# Patient Record
Sex: Male | Born: 1960 | Race: White | Hispanic: No | Marital: Single | Smoking: Never smoker
Health system: Southern US, Community
[De-identification: ages and names within clinical notes are randomized; demographics above are authoritative.]

## PROBLEM LIST (undated history)

## (undated) DIAGNOSIS — Z9889 Other specified postprocedural states: Secondary | ICD-10-CM

## (undated) DIAGNOSIS — E7800 Pure hypercholesterolemia, unspecified: Secondary | ICD-10-CM

## (undated) DIAGNOSIS — K219 Gastro-esophageal reflux disease without esophagitis: Secondary | ICD-10-CM

## (undated) DIAGNOSIS — I509 Heart failure, unspecified: Secondary | ICD-10-CM

## (undated) DIAGNOSIS — Z794 Long term (current) use of insulin: Secondary | ICD-10-CM

## (undated) DIAGNOSIS — R112 Nausea with vomiting, unspecified: Secondary | ICD-10-CM

## (undated) DIAGNOSIS — N189 Chronic kidney disease, unspecified: Secondary | ICD-10-CM

## (undated) DIAGNOSIS — I739 Peripheral vascular disease, unspecified: Secondary | ICD-10-CM

## (undated) DIAGNOSIS — J189 Pneumonia, unspecified organism: Secondary | ICD-10-CM

## (undated) DIAGNOSIS — F329 Major depressive disorder, single episode, unspecified: Secondary | ICD-10-CM

## (undated) DIAGNOSIS — G629 Polyneuropathy, unspecified: Secondary | ICD-10-CM

## (undated) DIAGNOSIS — Z8719 Personal history of other diseases of the digestive system: Secondary | ICD-10-CM

## (undated) DIAGNOSIS — IMO0001 Reserved for inherently not codable concepts without codable children: Secondary | ICD-10-CM

## (undated) DIAGNOSIS — L988 Other specified disorders of the skin and subcutaneous tissue: Secondary | ICD-10-CM

## (undated) DIAGNOSIS — E119 Type 2 diabetes mellitus without complications: Secondary | ICD-10-CM

## (undated) DIAGNOSIS — I10 Essential (primary) hypertension: Secondary | ICD-10-CM

## (undated) HISTORY — PX: TONSILLECTOMY: SUR1361

## (undated) HISTORY — DX: Other specified disorders of the skin and subcutaneous tissue: L98.8

---

## 2002-12-31 ENCOUNTER — Emergency Department (HOSPITAL_COMMUNITY): Admission: AD | Admit: 2002-12-31 | Discharge: 2002-12-31 | Payer: Self-pay | Admitting: Family Medicine

## 2003-01-02 ENCOUNTER — Emergency Department (HOSPITAL_COMMUNITY): Admission: AD | Admit: 2003-01-02 | Discharge: 2003-01-02 | Payer: Self-pay | Admitting: Family Medicine

## 2003-01-05 ENCOUNTER — Emergency Department (HOSPITAL_COMMUNITY): Admission: AD | Admit: 2003-01-05 | Discharge: 2003-01-05 | Payer: Self-pay | Admitting: Family Medicine

## 2003-11-27 ENCOUNTER — Inpatient Hospital Stay (HOSPITAL_COMMUNITY): Admission: EM | Admit: 2003-11-27 | Discharge: 2003-12-03 | Payer: Self-pay | Admitting: Emergency Medicine

## 2003-12-02 ENCOUNTER — Encounter: Payer: Self-pay | Admitting: Cardiology

## 2003-12-31 ENCOUNTER — Encounter: Admission: RE | Admit: 2003-12-31 | Discharge: 2003-12-31 | Payer: Self-pay | Admitting: Internal Medicine

## 2007-07-29 ENCOUNTER — Emergency Department (HOSPITAL_COMMUNITY): Admission: EM | Admit: 2007-07-29 | Discharge: 2007-07-30 | Payer: Self-pay | Admitting: Emergency Medicine

## 2007-12-26 ENCOUNTER — Ambulatory Visit: Payer: Self-pay | Admitting: Family Medicine

## 2007-12-26 DIAGNOSIS — E7800 Pure hypercholesterolemia, unspecified: Secondary | ICD-10-CM

## 2007-12-26 DIAGNOSIS — E119 Type 2 diabetes mellitus without complications: Secondary | ICD-10-CM

## 2007-12-26 LAB — CONVERTED CEMR LAB
ALT: 8 units/L (ref 0–53)
AST: 9 units/L (ref 0–37)
Albumin: 4.1 g/dL (ref 3.5–5.2)
Alkaline Phosphatase: 114 units/L (ref 39–117)
BUN: 24 mg/dL — ABNORMAL HIGH (ref 6–23)
Basophils Absolute: 0 10*3/uL (ref 0.0–0.1)
Basophils Relative: 1 % (ref 0–1)
CO2: 20 meq/L (ref 19–32)
Calcium: 9.6 mg/dL (ref 8.4–10.5)
Chloride: 98 meq/L (ref 96–112)
Cholesterol: 347 mg/dL — ABNORMAL HIGH (ref 0–200)
Creatinine, Ser: 0.75 mg/dL (ref 0.40–1.50)
Eosinophils Absolute: 0.1 10*3/uL (ref 0.0–0.7)
Eosinophils Relative: 1 % (ref 0–5)
Glucose, Bld: 402 mg/dL — ABNORMAL HIGH (ref 70–99)
HCT: 44.1 % (ref 39.0–52.0)
HDL: 33 mg/dL — ABNORMAL LOW (ref >39)
Hemoglobin: 14.4 g/dL (ref 13.0–17.0)
Hgb A1c MFr Bld: >14 %
Lymphocytes Relative: 29 % (ref 12–46)
Lymphs Abs: 1.3 10*3/uL (ref 0.7–4.0)
MCHC: 32.7 g/dL (ref 30.0–36.0)
MCV: 83.5 fL (ref 78.0–100.0)
Monocytes Absolute: 0.4 10*3/uL (ref 0.1–1.0)
Monocytes Relative: 8 % (ref 3–12)
Neutro Abs: 2.7 10*3/uL (ref 1.7–7.7)
Neutrophils Relative %: 61 % (ref 43–77)
Platelets: 147 10*3/uL — ABNORMAL LOW (ref 150–400)
Potassium: 4.2 meq/L (ref 3.5–5.3)
Protein, U semiquant: 30
RBC: 5.28 M/uL (ref 4.22–5.81)
RDW: 14 % (ref 11.5–15.5)
Sodium: 136 meq/L (ref 135–145)
Total Bilirubin: 0.4 mg/dL (ref 0.3–1.2)
Total CHOL/HDL Ratio: 10.5
Total Protein: 6.9 g/dL (ref 6.0–8.3)
Triglycerides: 783 mg/dL — ABNORMAL HIGH (ref <150)
WBC: 4.4 10*3/uL (ref 4.0–10.5)

## 2007-12-28 ENCOUNTER — Encounter: Payer: Self-pay | Admitting: Family Medicine

## 2008-01-29 ENCOUNTER — Encounter: Payer: Self-pay | Admitting: Family Medicine

## 2008-01-30 ENCOUNTER — Ambulatory Visit: Payer: Self-pay | Admitting: Family Medicine

## 2008-01-30 LAB — CONVERTED CEMR LAB
BUN: 21 mg/dL (ref 6–23)
CO2: 24 meq/L (ref 19–32)
Calcium: 9.1 mg/dL (ref 8.4–10.5)
Chloride: 104 meq/L (ref 96–112)
Cholesterol: 188 mg/dL (ref 0–200)
Creatinine, Ser: 0.69 mg/dL (ref 0.40–1.50)
Glucose, Bld: 164 mg/dL — ABNORMAL HIGH (ref 70–99)
HDL: 34 mg/dL — ABNORMAL LOW (ref >39)
LDL Cholesterol: 121 mg/dL — ABNORMAL HIGH (ref 0–99)
Potassium: 4.3 meq/L (ref 3.5–5.3)
Sodium: 141 meq/L (ref 135–145)
Total CHOL/HDL Ratio: 5.5
Triglycerides: 164 mg/dL — ABNORMAL HIGH (ref <150)
VLDL: 33 mg/dL (ref 0–40)

## 2008-01-31 ENCOUNTER — Encounter: Payer: Self-pay | Admitting: Family Medicine

## 2008-02-20 ENCOUNTER — Ambulatory Visit: Payer: Self-pay | Admitting: Family Medicine

## 2008-02-20 DIAGNOSIS — H538 Other visual disturbances: Secondary | ICD-10-CM

## 2008-03-15 ENCOUNTER — Telehealth (INDEPENDENT_AMBULATORY_CARE_PROVIDER_SITE_OTHER): Payer: Self-pay | Admitting: *Deleted

## 2008-03-19 ENCOUNTER — Telehealth: Payer: Self-pay | Admitting: Family Medicine

## 2008-06-18 ENCOUNTER — Ambulatory Visit: Payer: Self-pay | Admitting: Family Medicine

## 2008-06-18 DIAGNOSIS — I10 Essential (primary) hypertension: Secondary | ICD-10-CM | POA: Insufficient documentation

## 2008-06-18 LAB — CONVERTED CEMR LAB: Hgb A1c MFr Bld: 8.2 %

## 2008-06-27 ENCOUNTER — Encounter: Payer: Self-pay | Admitting: Family Medicine

## 2008-06-27 LAB — CONVERTED CEMR LAB
Cholesterol: 164 mg/dL (ref 0–200)
HDL: 32 mg/dL — ABNORMAL LOW (ref >39)
LDL Cholesterol: 87 mg/dL (ref 0–99)
Total CHOL/HDL Ratio: 5.1
Triglycerides: 227 mg/dL — ABNORMAL HIGH (ref <150)
VLDL: 45 mg/dL — ABNORMAL HIGH (ref 0–40)

## 2008-12-05 ENCOUNTER — Ambulatory Visit: Payer: Self-pay | Admitting: Family Medicine

## 2008-12-05 DIAGNOSIS — L0201 Cutaneous abscess of face: Secondary | ICD-10-CM | POA: Insufficient documentation

## 2008-12-05 DIAGNOSIS — L03211 Cellulitis of face: Secondary | ICD-10-CM

## 2008-12-06 ENCOUNTER — Encounter: Payer: Self-pay | Admitting: Family Medicine

## 2008-12-13 ENCOUNTER — Ambulatory Visit: Payer: Self-pay | Admitting: Family Medicine

## 2008-12-13 DIAGNOSIS — R252 Cramp and spasm: Secondary | ICD-10-CM

## 2008-12-13 LAB — CONVERTED CEMR LAB
ALT: 13 units/L (ref 0–53)
AST: 11 units/L (ref 0–37)
Albumin: 4.3 g/dL (ref 3.5–5.2)
Alkaline Phosphatase: 106 units/L (ref 39–117)
BUN: 33 mg/dL — ABNORMAL HIGH (ref 6–23)
CO2: 23 meq/L (ref 19–32)
Calcium: 10.5 mg/dL (ref 8.4–10.5)
Chloride: 98 meq/L (ref 96–112)
Creatinine, Ser: 1.22 mg/dL (ref 0.40–1.50)
Glucose, Bld: 328 mg/dL — ABNORMAL HIGH (ref 70–99)
HCT: 38.6 % — ABNORMAL LOW (ref 39.0–52.0)
Hemoglobin: 12.8 g/dL — ABNORMAL LOW (ref 13.0–17.0)
MCHC: 33.2 g/dL (ref 30.0–36.0)
MCV: 84.5 fL (ref 78.0–100.0)
Platelets: 234 10*3/uL (ref 150–400)
Potassium: 5.8 meq/L — ABNORMAL HIGH (ref 3.5–5.3)
RBC: 4.57 M/uL (ref 4.22–5.81)
RDW: 12.8 % (ref 11.5–15.5)
Sodium: 134 meq/L — ABNORMAL LOW (ref 135–145)
TSH: 2.02 microintl units/mL (ref 0.350–4.500)
Total Bilirubin: 0.3 mg/dL (ref 0.3–1.2)
Total Protein: 7.3 g/dL (ref 6.0–8.3)
WBC: 8.9 10*3/uL (ref 4.0–10.5)

## 2008-12-17 ENCOUNTER — Ambulatory Visit: Payer: Self-pay | Admitting: Family Medicine

## 2008-12-17 ENCOUNTER — Encounter: Payer: Self-pay | Admitting: Family Medicine

## 2008-12-17 DIAGNOSIS — E875 Hyperkalemia: Secondary | ICD-10-CM

## 2008-12-17 LAB — CONVERTED CEMR LAB
BUN: 46 mg/dL — ABNORMAL HIGH (ref 6–23)
CO2: 18 meq/L — ABNORMAL LOW (ref 19–32)
Calcium: 9.6 mg/dL (ref 8.4–10.5)
Chloride: 99 meq/L (ref 96–112)
Creatinine, Ser: 1.75 mg/dL — ABNORMAL HIGH (ref 0.40–1.50)
Glucose, Bld: 290 mg/dL — ABNORMAL HIGH (ref 70–99)
Potassium: 5.2 meq/L (ref 3.5–5.3)
Sodium: 133 meq/L — ABNORMAL LOW (ref 135–145)

## 2008-12-18 ENCOUNTER — Telehealth: Payer: Self-pay | Admitting: Family Medicine

## 2008-12-18 ENCOUNTER — Encounter: Payer: Self-pay | Admitting: Family Medicine

## 2008-12-19 ENCOUNTER — Telehealth: Payer: Self-pay | Admitting: *Deleted

## 2009-03-14 ENCOUNTER — Ambulatory Visit: Payer: Self-pay | Admitting: Family Medicine

## 2009-03-14 DIAGNOSIS — N181 Chronic kidney disease, stage 1: Secondary | ICD-10-CM | POA: Insufficient documentation

## 2009-03-14 LAB — CONVERTED CEMR LAB
ALT: 13 units/L (ref 0–53)
AST: 11 units/L (ref 0–37)
Albumin: 4.1 g/dL (ref 3.5–5.2)
Alkaline Phosphatase: 116 units/L (ref 39–117)
BUN: 30 mg/dL — ABNORMAL HIGH (ref 6–23)
Bilirubin Urine: NEGATIVE
CO2: 25 meq/L (ref 19–32)
Calcium: 9.7 mg/dL (ref 8.4–10.5)
Chloride: 98 meq/L (ref 96–112)
Creatinine, Ser: 1.06 mg/dL (ref 0.40–1.50)
Glucose, Bld: 394 mg/dL — ABNORMAL HIGH (ref 70–99)
Glucose, Urine, Semiquant: 500
HCT: 40.7 % (ref 39.0–52.0)
Hemoglobin: 13.5 g/dL (ref 13.0–17.0)
Hgb A1c MFr Bld: 13.4 %
Ketones, urine, test strip: NEGATIVE
MCHC: 33.2 g/dL (ref 30.0–36.0)
MCV: 83.6 fL (ref 78.0–100.0)
Nitrite: NEGATIVE
Platelets: 161 10*3/uL (ref 150–400)
Potassium: 4.2 meq/L (ref 3.5–5.3)
Protein, U semiquant: 100
RBC: 4.87 M/uL (ref 4.22–5.81)
RDW: 13.5 % (ref 11.5–15.5)
Sodium: 136 meq/L (ref 135–145)
Specific Gravity, Urine: <1.005
Total Bilirubin: 0.4 mg/dL (ref 0.3–1.2)
Total Protein: 7.7 g/dL (ref 6.0–8.3)
Urobilinogen, UA: 0.2
WBC Urine, dipstick: NEGATIVE
WBC: 6.3 10*3/uL (ref 4.0–10.5)
pH: 5.5

## 2009-03-17 ENCOUNTER — Telehealth: Payer: Self-pay | Admitting: Family Medicine

## 2009-03-17 ENCOUNTER — Encounter: Payer: Self-pay | Admitting: Family Medicine

## 2009-03-21 ENCOUNTER — Ambulatory Visit: Payer: Self-pay | Admitting: Family Medicine

## 2009-03-25 ENCOUNTER — Encounter: Payer: Self-pay | Admitting: Family Medicine

## 2009-03-25 ENCOUNTER — Telehealth: Payer: Self-pay | Admitting: Family Medicine

## 2009-03-25 LAB — CONVERTED CEMR LAB
Cholesterol: 315 mg/dL — ABNORMAL HIGH (ref 0–200)
HDL: 28 mg/dL — ABNORMAL LOW (ref >39)
Total CHOL/HDL Ratio: 11.3
Triglycerides: 702 mg/dL — ABNORMAL HIGH (ref <150)

## 2010-03-10 NOTE — Letter (Signed)
Summary: Generic Letter  [facility]  [address]   [place], [place] [address]   Phone: [phone]  Fax: [phone]    03/25/2009  WILEY MAGAN [address] [place], [place]  [address]  Dear Mr. SANON,   It was a pleasure to see you in the office last week.  I write with news about your recent fasting cholesterol panel.   The triglyceride level in your lab study was extremely high at 702mg /dL.  This is likely related to your high blood sugars.  I believe we need to get your blood sugars under much better control, as well as add other medications to bring down the triglyceride levels even more.   Please be in touch with me so that we can discuss this important matter regarding your health.  If you call, please leave a number and time when it is easiest to get in touch with you.        Sincerely,   [name] MD  Appended Document: Generic Letter mailed.

## 2010-03-10 NOTE — Progress Notes (Signed)
Summary: phn msg  Phone Note Call from Patient Call back at Home Phone [phone]   Caller: Patient Summary of Call: Pt states he is returning Dr. Marinell Blight call. Initial call taken by: Clydell Hakim,  March 25, 2009 3:48 PM     Called patient back.  Left voice message.  [name] MD  March 26, 2009 3:35 PM

## 2010-03-10 NOTE — Progress Notes (Signed)
Summary: Rx Prob  Phone Note Call from Patient Call back at Home Phone [phone]   Caller: Patient Summary of Call: York Spaniel that the rx that was to be sent for him on Friday to [facility] for his bp has not reached them yet. Initial call taken by: Clydell Hakim,  March 17, 2009 2:50 PM    New/Updated Medications: AMLODIPINE BESYLATE 5 MG TABS (AMLODIPINE BESYLATE) 1 by mouth once daily Prescriptions: AMLODIPINE BESYLATE 5 MG TABS (AMLODIPINE BESYLATE) 1 by mouth once daily  #30 x 11   Entered and Authorized by:   [name] MD   Signed by:   [name] MD on 03/17/2009   Method used:   Electronically to        [facility]* (retail)       [address] [place], [place]  [address]       Ph: [phone]       Fax: [phone]   RxID:   [id number]   Appended Document: Rx Prob message left on voicemail for patient.

## 2010-03-10 NOTE — Letter (Signed)
Summary: Generic Letter  [facility]  [address]   [place], [place] [address]   Phone: [phone]  Fax: [phone]    03/17/2009  Lawrence [name] [address] [place], [place]  [address]  Dear Lawrence [name],   It was a pleasure to see you last week in the office. I write with good news.  Your kidney function and potassium levels are both much improved from your previous levels.  I recommend you continue with the amlodipine (Norvasc) once daily as was prescribed to [facility] on the day of your visit.   The bad news has to do with your diabetes control.  The Hemoglobin A1C number is up to 13.4%.  Our target range is to get this number down to below 8%.    If there are cost concerns regarding the lantus insulin, perhaps we can consider using another, less expensive form of insulin.  I will be in touch when the results of your fasting cholesterol panel come back.         Sincerely,   [name] MD  Appended Document: Generic Letter mailed.

## 2010-03-10 NOTE — Assessment & Plan Note (Signed)
Summary: f/u,df   Vital Signs:  Patient profile:   50 year old male Height:      65 inches Weight:      207.9 pounds BMI:     34.72 Pulse rate:   94 / minute BP sitting:   168 / 86  (right arm)  Vitals Entered By: Arlyss Repress CMA, (March 14, 2009 1:58 PM) CC: f/up DM Is Patient Diabetic? Yes Pain Assessment Patient in pain? no        Primary Care Provider:  [name] MD  CC:  f/up DM.  History of Present Illness: Lawrence [name] comes in today for check of his DM, as well as other chronic medical problems.   He frequently has been unable to purchase his Lantus, even with insurance. Has been out of it for the past 1 week.  Taking other meds.   In mid-November he was seenby Dr Tonny Bollman to have an elevated serum creatinine.  Was repeated and found to be elevated around 1.75.  Advised to stop his ACEI and to follow up 2 weeks later. He recalls these instructions, says he did not follow up because "our schedules don't work out well".    He reports that he takes about 4-6 tablets of Advil a week, on average.  He thinks he may have been taking more in November, for assorted muscular aches and pains.   Denies nausea or vomiting, no diarrhea, no polyuria orpolydipsia. No fevers or chills.  Does get muscle cramps in legs often.  In the past has had elevated K+.    Habits & Providers  Alcohol-Tobacco-Diet     Tobacco Status: never  Current Medications (verified): 1)  Metformin Hcl 850 Mg Tabs (Metformin Hcl) .... Sig: Take 1 Tab By Mouth Two Times A Day 2)  Glipizide 10 Mg Xr24h-Tab (Glipizide) .... Sig: Take 1 Tab By Mouth Twice Daily 3)  Lantus 100 Unit/ml Soln (Insulin Glargine) .... Sig: Use 20 Units Subcutaneously Once Daily At Bedtime Disp: Quantity Sufficient One Month 4)  Aspirin Childrens 81 Mg Chew (Aspirin) .... Sig: Take 1 Tab By Mouth Once Daily 5)  Glucometer Elite Classic  Kit (Blood Glucose Monitoring Suppl) .... Sig: Check Glucose At Home Once Daily As Directed 6)   Glucometer Elite Test  Strp (Glucose Blood) .... Sig: Check Home Glucose Once Daily As Directed Disp #100 in Package 7)  Simvastatin 20 Mg Tabs (Simvastatin) .... Sig: Take 1 Tab By Mouth Once Daily At Bedtime 8)  Hydrochlorothiazide 25 Mg Tabs (Hydrochlorothiazide) .Marland Kitchen.. 1 Tab By Mouth in Morning 9)  Amlodipine Besylate 5 Mg Tabs (Amlodipine Besylate) .... Sig Take 1 Tab By Mouth One Time Daily  Allergies (verified): No Known Drug Allergies  Family History: Reviewed history from 02/20/2008 and no changes required. Still working at [employer]; walks the store a lot.  Rare social alcohol.  Reads and watches TV in bed before trying to sleep.;  Does not snore nor have headaches or fatigue.  Social History: Reviewed history and no changes required. Works at [employer].  THird shift sometimes. Never been a smoker. Rare social alcohol.   Physical Exam  General:  Well-developed,well-nourished,in no acute distress; alert,appropriate and cooperative throughout examination Eyes:  No corneal or conjunctival inflammation noted. EOMI. Perrla.  Mouth:  Oral mucosa and oropharynx without lesions or exudates.  Teeth in good repair. Neck:  No deformities, masses, or tenderness noted. Lungs:  Normal respiratory effort, chest expands symmetrically. Lungs are clear to auscultation, no crackles or wheezes.  Heart:  Normal rate and regular rhythm. S1 and S2 normal without gallop, murmur, click, rub or other extra sounds. Extremities:  L ankle with mild tendenres to passive flexion/extension.  No edema, no ecchymosis or erythema.  Able to bear weight without assistance.   Diabetes Management Exam:    Foot Exam (with socks and/or shoes not present):       Sensory-Pinprick/Light touch:          Left medial foot (L-4): normal          Left dorsal foot (L-5): normal          Left lateral foot (S-1): normal          Right medial foot (L-4): normal          Right dorsal foot (L-5): normal          Right lateral foot  (S-1): normal       Sensory-Monofilament:          Left foot: normal          Right foot: normal       Inspection:          Left foot: normal          Right foot: normal       Nails:          Left foot: normal          Right foot: normal    Eye Exam:       Eye Exam done elsewhere          Date: 10/09/2008          Results: normal          Done by: Dr Luciana Axe   Impression & Recommendations:  Problem # 1:  RENAL INSUFFICIENCY, CHRONIC (ICD-585.9)  Patient with elevated creatinine on labwork done Nov 2010.  He did not follow up for this as per instructions.  He has not been taking the lisinopril; recalls that he had been taking a larger thanusual amount of ibuprofen.  We will check another serum creatinine as well as a urinalysis today.  Will likely order a renal US as well.  To continue to hold the ACEI, advised NOT to take any more NSAIDs. I suspect that poor DM control, as well as poor HTN control and fairly frequent use of NSAIDs contribute to his decreased renal function.   Orders: [facility]- Est  Level 4 ([address]26)  Problem # 2:  DIABETES MELLITUS, TYPE II, CONTROLLED, W/O COMPLICATIONS (ICD-250.00) Poor control.  Has not been able to buy his Lantus.  A sample pen is given to him today in order to get him to his next pay-day.  Given poor renal function at last check, to consider discontinuing metformin pending metabolic panel ordered today.  We may need to consider another insulin regimen, such as 70/30, if he is unable to afford the lantus even with hisinsurance.   Role of diet control, activity.   The following medications were removed from the medication list:    Lisinopril 40 Mg Tabs (Lisinopril) ..... Sig: take 1 tablet by mouth once daily His updated medication list for this problem includes:    Metformin Hcl 850 Mg Tabs (Metformin hcl) ..... Sig: take 1 tab by mouth two times a day    Glipizide 10 Mg Xr24h-tab (Glipizide) ..... Sig: take 1 tab by mouth twice daily    Lantus 100  Unit/ml Soln (Insulin glargine) ..... Sig: use 20 units subcutaneously once  daily at bedtime disp: quantity sufficient one month    Aspirin Childrens 81 Mg Chew (Aspirin) ..... Sig: take 1 tab by mouth once daily  Orders: A1C-[facility] ([address]) Comp Met-[facility] ([address]) CBC-[facility] ([address]) [facility]- Est  Level 4 ([address])  Problem # 3:  ESSENTIAL HYPERTENSION, BENIGN (ICD-401.1) Poorly, controlled, now off the ACEI for about 2 months.  Will start norvasc today for his bloodpressure.  COntinue HCTZ. Assess K+, renal function  and CBC.  The following medications were removed from the medication list:    Lisinopril 40 Mg Tabs (Lisinopril) ..... Sig: take 1 tablet by mouth once daily His updated medication list for this problem includes:    Hydrochlorothiazide 25 Mg Tabs (Hydrochlorothiazide) .Marland Kitchen... 1 tab by mouth in morning    Amlodipine Besylate 5 Mg Tabs (Amlodipine besylate) ..... Sig take 1 tab by mouth one time daily  Orders: CBC-[facility] (13086) Urinalysis-[facility] (00000)  Problem # 4:  HYPERCHOLESTEROLEMIA (ICD-272.0) Characterized by hypertriglyceridemia.  Likely due to poor glucose control.  His updated medication list for this problem includes:    Simvastatin 20 Mg Tabs (Simvastatin) ..... Sig: take 1 tab by mouth once daily at bedtime  Orders: [facility]- Est  Level 4 ([address]214)Future Orders: Lipid-[facility] ([id number]) ... 03/19/2010  Complete Medication List: 1)  Metformin Hcl 850 Mg Tabs (Metformin hcl) .... Sig: take 1 tab by mouth two times a day 2)  Glipizide 10 Mg Xr24h-tab (Glipizide) .... Sig: take 1 tab by mouth twice daily 3)  Lantus 100 Unit/ml Soln (Insulin glargine) .... Sig: use 20 units subcutaneously once daily at bedtime disp: quantity sufficient one month 4)  Aspirin Childrens 81 Mg Chew (Aspirin) .... Sig: take 1 tab by mouth once daily 5)  Glucometer Elite Classic Kit (Blood glucose monitoring suppl) .... Sig: check glucose at home once daily as directed 6)  Glucometer Elite Test Strp  (Glucose blood) .... Sig: check home glucose once daily as directed disp #100 in package 7)  Simvastatin 20 Mg Tabs (Simvastatin) .... Sig: take 1 tab by mouth once daily at bedtime 8)  Hydrochlorothiazide 25 Mg Tabs (Hydrochlorothiazide) .Marland Kitchen.. 1 tab by mouth in morning 9)  Amlodipine Besylate 5 Mg Tabs (Amlodipine besylate) .... Sig take 1 tab by mouth one time daily  Patient Instructions: 1)  It was good to see you in the office today.  2)  I am concerned about your kidney function, and your sugar and blood pressure control.   3)  I ask that you NOT USE ANTI-INFLAMMATORY MEDICATIONS SUCH AS IBUPROFEN (Motrin, Advil) or NAPROXEN (Aleve).  ALSO, CONTINUE TO STAY OFF THE LISINOPRIL.   4)  I am prescribing a new blood pressure medication, called Norvasc, for you to take one time daily. I sent it to your pharmacy at [facility]. 5)  I am drawing labs today, and I ask taht you come back for the cholesterol labs after 8 hours of fasting some day next week.  Laboratory Results   Urine Tests  Date/Time Received: March 14, 2009 2:05 PM  Date/Time Reported: March 14, 2009 2:50 PM    Routine Urinalysis   Color: yellow Appearance: Clear Glucose: 500   (Normal Range: Negative) Bilirubin: negative   (Normal Range: Negative) Ketone: negative   (Normal Range: Negative) Spec. Gravity: <1.005   (Normal Range: 1.003-1.035) Blood: small   (Normal Range: Negative) pH: 5.5   (Normal Range: 5.0-8.0) Protein: 100   (Normal Range: Negative) Urobilinogen: 0.2   (Normal Range: 0-1) Nitrite: negative   (Normal  Range: Negative) Leukocyte Esterace: negative   (Normal Range: Negative)  Urine Microscopic WBC/HPF: rare RBC/HPF: 1-5 Bacteria/HPF: trace Epithelial/HPF: rare    Comments: ...........test performed by...........Marland KitchenTerese Door, CMA   Blood Tests   Date/Time Received: March 14, 2009 2:01 PM  Date/Time Reported: March 14, 2009 2:52 PM   HGBA1C: 13.4%   (Normal Range:  Non-Diabetic - 3-6%   Control Diabetic - 6-8%)  Comments: ...........test performed by...........Marland KitchenTerese Door, CMA       Prevention & Chronic Care Immunizations   Influenza vaccine: Not documented    Tetanus booster: 12/26/2007: Tdap    Pneumococcal vaccine: Not documented  Other Screening   Smoking status: never  (03/14/2009)  Diabetes Mellitus   HgbA1C: 13.4  (03/14/2009)    Eye exam: normal  (10/09/2008)   Eye exam due: 10/2009    Foot exam: yes  (03/14/2009)   High risk foot: Not documented   Foot care education: Not documented    Urine microalbumin/creatinine ratio: Not documented    Diabetes flowsheet reviewed?: Yes   Progress toward A1C goal: Deteriorated  Lipids   Total Cholesterol: 164  (06/18/2008)   LDL: 87  (06/18/2008)   LDL Direct: Not documented   HDL: 32  (06/18/2008)   Triglycerides: 227  (06/18/2008)    SGOT (AST): 11  (12/13/2008)   SGPT (ALT): 13  (12/13/2008) CMP ordered    Alkaline phosphatase: 106  (12/13/2008)   Total bilirubin: 0.3  (12/13/2008)    Lipid flowsheet reviewed?: Yes   Progress toward LDL goal: Deteriorated  Hypertension   Last Blood Pressure: 168 / 86  (03/14/2009)   Serum creatinine: 1.75  (12/17/2008)   Serum potassium 5.2  (12/17/2008) CMP ordered     Hypertension flowsheet reviewed?: Yes   Progress toward BP goal: Deteriorated  Self-Management Support :   Personal Goals (by the next clinic visit) :     Personal A1C goal: 8  (12/13/2008)     Personal blood pressure goal: 130/80  (12/13/2008)     Personal LDL goal: 100  (12/13/2008)    Diabetes self-management support: Written self-care plan  (03/14/2009)   Diabetes care plan printed    Hypertension self-management support: Written self-care plan  (03/14/2009)   Hypertension self-care plan printed.    Lipid self-management support: Written self-care plan  (03/14/2009)   Lipid self-care plan printed.

## 2010-04-27 ENCOUNTER — Emergency Department (HOSPITAL_COMMUNITY)
Admission: EM | Admit: 2010-04-27 | Discharge: 2010-04-28 | Disposition: A | Discharge status: Home / Self Care | Payer: Self-pay | Attending: Emergency Medicine | Admitting: Emergency Medicine

## 2010-04-27 DIAGNOSIS — L97409 Non-pressure chronic ulcer of unspecified heel and midfoot with unspecified severity: Secondary | ICD-10-CM | POA: Insufficient documentation

## 2010-04-27 DIAGNOSIS — M79609 Pain in unspecified limb: Secondary | ICD-10-CM | POA: Insufficient documentation

## 2010-04-27 DIAGNOSIS — Z794 Long term (current) use of insulin: Secondary | ICD-10-CM | POA: Insufficient documentation

## 2010-04-27 DIAGNOSIS — E1169 Type 2 diabetes mellitus with other specified complication: Secondary | ICD-10-CM | POA: Insufficient documentation

## 2010-04-27 DIAGNOSIS — Z79899 Other long term (current) drug therapy: Secondary | ICD-10-CM | POA: Insufficient documentation

## 2010-04-28 ENCOUNTER — Emergency Department (HOSPITAL_COMMUNITY): Payer: Self-pay

## 2010-04-28 LAB — CBC
HCT: 38.2 % — ABNORMAL LOW (ref 39.0–52.0)
Hemoglobin: 13.4 g/dL (ref 13.0–17.0)
MCH: 27.9 pg (ref 26.0–34.0)
MCHC: 35.1 g/dL (ref 30.0–36.0)
MCV: 79.4 fL (ref 78.0–100.0)
Platelets: 190 10*3/uL (ref 150–400)
RBC: 4.81 MIL/uL (ref 4.22–5.81)
RDW: 12.8 % (ref 11.5–15.5)
WBC: 7.2 10*3/uL (ref 4.0–10.5)

## 2010-04-28 LAB — DIFFERENTIAL
Basophils Absolute: 0 10*3/uL (ref 0.0–0.1)
Basophils Relative: 0 % (ref 0–1)
Eosinophils Absolute: 0.1 10*3/uL (ref 0.0–0.7)
Eosinophils Relative: 1 % (ref 0–5)
Lymphocytes Relative: 24 % (ref 12–46)
Lymphs Abs: 1.7 10*3/uL (ref 0.7–4.0)
Monocytes Absolute: 0.6 10*3/uL (ref 0.1–1.0)
Monocytes Relative: 8 % (ref 3–12)
Neutro Abs: 4.7 10*3/uL (ref 1.7–7.7)
Neutrophils Relative %: 66 % (ref 43–77)

## 2010-05-04 ENCOUNTER — Other Ambulatory Visit (HOSPITAL_BASED_OUTPATIENT_CLINIC_OR_DEPARTMENT_OTHER): Payer: Self-pay | Admitting: Internal Medicine

## 2010-05-04 ENCOUNTER — Encounter (HOSPITAL_BASED_OUTPATIENT_CLINIC_OR_DEPARTMENT_OTHER): Payer: Self-pay | Attending: Internal Medicine

## 2010-05-04 DIAGNOSIS — E049 Nontoxic goiter, unspecified: Secondary | ICD-10-CM | POA: Insufficient documentation

## 2010-05-04 DIAGNOSIS — L97409 Non-pressure chronic ulcer of unspecified heel and midfoot with unspecified severity: Secondary | ICD-10-CM | POA: Insufficient documentation

## 2010-05-04 DIAGNOSIS — I739 Peripheral vascular disease, unspecified: Secondary | ICD-10-CM | POA: Insufficient documentation

## 2010-05-04 DIAGNOSIS — E669 Obesity, unspecified: Secondary | ICD-10-CM | POA: Insufficient documentation

## 2010-05-04 DIAGNOSIS — E1069 Type 1 diabetes mellitus with other specified complication: Secondary | ICD-10-CM | POA: Insufficient documentation

## 2010-05-04 DIAGNOSIS — Z79899 Other long term (current) drug therapy: Secondary | ICD-10-CM | POA: Insufficient documentation

## 2010-05-04 DIAGNOSIS — Z794 Long term (current) use of insulin: Secondary | ICD-10-CM | POA: Insufficient documentation

## 2010-05-04 DIAGNOSIS — M79609 Pain in unspecified limb: Secondary | ICD-10-CM | POA: Insufficient documentation

## 2010-05-04 DIAGNOSIS — I10 Essential (primary) hypertension: Secondary | ICD-10-CM | POA: Insufficient documentation

## 2010-05-04 DIAGNOSIS — Z9849 Cataract extraction status, unspecified eye: Secondary | ICD-10-CM | POA: Insufficient documentation

## 2010-05-04 LAB — GLUCOSE, CAPILLARY: Glucose-Capillary: 390 mg/dL — ABNORMAL HIGH (ref 70–99)

## 2010-05-05 ENCOUNTER — Ambulatory Visit: Payer: Self-pay

## 2010-05-07 ENCOUNTER — Encounter (HOSPITAL_BASED_OUTPATIENT_CLINIC_OR_DEPARTMENT_OTHER): Payer: Self-pay | Attending: Internal Medicine

## 2010-05-07 DIAGNOSIS — L97409 Non-pressure chronic ulcer of unspecified heel and midfoot with unspecified severity: Secondary | ICD-10-CM | POA: Insufficient documentation

## 2010-05-07 DIAGNOSIS — E1169 Type 2 diabetes mellitus with other specified complication: Secondary | ICD-10-CM | POA: Insufficient documentation

## 2010-05-07 DIAGNOSIS — I10 Essential (primary) hypertension: Secondary | ICD-10-CM | POA: Insufficient documentation

## 2010-05-07 DIAGNOSIS — I2510 Atherosclerotic heart disease of native coronary artery without angina pectoris: Secondary | ICD-10-CM | POA: Insufficient documentation

## 2010-05-07 DIAGNOSIS — I739 Peripheral vascular disease, unspecified: Secondary | ICD-10-CM | POA: Insufficient documentation

## 2010-05-11 ENCOUNTER — Encounter (HOSPITAL_BASED_OUTPATIENT_CLINIC_OR_DEPARTMENT_OTHER): Payer: Self-pay | Attending: Internal Medicine

## 2010-05-11 ENCOUNTER — Other Ambulatory Visit (HOSPITAL_BASED_OUTPATIENT_CLINIC_OR_DEPARTMENT_OTHER): Payer: Self-pay | Admitting: Internal Medicine

## 2010-05-11 DIAGNOSIS — L97409 Non-pressure chronic ulcer of unspecified heel and midfoot with unspecified severity: Secondary | ICD-10-CM | POA: Insufficient documentation

## 2010-05-11 DIAGNOSIS — E669 Obesity, unspecified: Secondary | ICD-10-CM | POA: Insufficient documentation

## 2010-05-11 DIAGNOSIS — I739 Peripheral vascular disease, unspecified: Secondary | ICD-10-CM | POA: Insufficient documentation

## 2010-05-11 DIAGNOSIS — E1069 Type 1 diabetes mellitus with other specified complication: Secondary | ICD-10-CM | POA: Insufficient documentation

## 2010-05-11 DIAGNOSIS — Z794 Long term (current) use of insulin: Secondary | ICD-10-CM | POA: Insufficient documentation

## 2010-05-11 DIAGNOSIS — M79609 Pain in unspecified limb: Secondary | ICD-10-CM | POA: Insufficient documentation

## 2010-05-11 DIAGNOSIS — I10 Essential (primary) hypertension: Secondary | ICD-10-CM | POA: Insufficient documentation

## 2010-05-11 DIAGNOSIS — Z9849 Cataract extraction status, unspecified eye: Secondary | ICD-10-CM | POA: Insufficient documentation

## 2010-05-11 DIAGNOSIS — E049 Nontoxic goiter, unspecified: Secondary | ICD-10-CM | POA: Insufficient documentation

## 2010-05-11 DIAGNOSIS — Z79899 Other long term (current) drug therapy: Secondary | ICD-10-CM | POA: Insufficient documentation

## 2010-05-11 LAB — GLUCOSE, CAPILLARY: Glucose-Capillary: 173 mg/dL — ABNORMAL HIGH (ref 70–99)

## 2010-05-12 ENCOUNTER — Other Ambulatory Visit: Payer: Self-pay | Admitting: Family Medicine

## 2010-05-12 DIAGNOSIS — I10 Essential (primary) hypertension: Secondary | ICD-10-CM

## 2010-05-12 NOTE — Telephone Encounter (Signed)
Refill request

## 2010-05-18 ENCOUNTER — Other Ambulatory Visit (HOSPITAL_BASED_OUTPATIENT_CLINIC_OR_DEPARTMENT_OTHER): Payer: Self-pay | Admitting: Internal Medicine

## 2010-05-18 LAB — GLUCOSE, CAPILLARY: Glucose-Capillary: 112 mg/dL — ABNORMAL HIGH (ref 70–99)

## 2010-05-22 ENCOUNTER — Encounter (INDEPENDENT_AMBULATORY_CARE_PROVIDER_SITE_OTHER): Payer: Self-pay

## 2010-05-22 ENCOUNTER — Telehealth: Payer: Self-pay | Admitting: Family Medicine

## 2010-05-22 DIAGNOSIS — L97909 Non-pressure chronic ulcer of unspecified part of unspecified lower leg with unspecified severity: Secondary | ICD-10-CM

## 2010-05-22 NOTE — Telephone Encounter (Signed)
Patient comes by office to pick up insulin sample of lantus pen. sanofi lot # [phone] exp date 07/14. Patient has not used pen before so pharmacy student  Ivery Quale instructed patient on use of lantus pen.

## 2010-05-22 NOTE — Telephone Encounter (Signed)
Pt states that he ran out of insulin last night and cannot afford to get anymore.  Wants to know if we have any samples.  He starts work next Friday so he will be able to get some after that.  He will be in a meeting this afternoon and will not be available. Please advise.

## 2010-05-22 NOTE — Telephone Encounter (Signed)
Message left for patient to call back. Will give one sample .

## 2010-05-29 ENCOUNTER — Ambulatory Visit (INDEPENDENT_AMBULATORY_CARE_PROVIDER_SITE_OTHER): Payer: Self-pay | Admitting: Family Medicine

## 2010-05-29 ENCOUNTER — Encounter: Payer: Self-pay | Admitting: Family Medicine

## 2010-05-29 DIAGNOSIS — E119 Type 2 diabetes mellitus without complications: Secondary | ICD-10-CM

## 2010-05-29 DIAGNOSIS — E11621 Type 2 diabetes mellitus with foot ulcer: Secondary | ICD-10-CM

## 2010-05-29 DIAGNOSIS — E1169 Type 2 diabetes mellitus with other specified complication: Secondary | ICD-10-CM

## 2010-05-29 DIAGNOSIS — E7800 Pure hypercholesterolemia, unspecified: Secondary | ICD-10-CM

## 2010-05-29 DIAGNOSIS — L98499 Non-pressure chronic ulcer of skin of other sites with unspecified severity: Secondary | ICD-10-CM

## 2010-05-29 DIAGNOSIS — I10 Essential (primary) hypertension: Secondary | ICD-10-CM

## 2010-05-29 LAB — COMPREHENSIVE METABOLIC PANEL
ALT: 16 U/L (ref 0–53)
BUN: 27 mg/dL — ABNORMAL HIGH (ref 6–23)
CO2: 23 mEq/L (ref 19–32)
Calcium: 10.2 mg/dL (ref 8.4–10.5)
Chloride: 102 mEq/L (ref 96–112)
Creat: 0.84 mg/dL (ref 0.40–1.50)
Glucose, Bld: 157 mg/dL — ABNORMAL HIGH (ref 70–99)
Total Bilirubin: 0.4 mg/dL (ref 0.3–1.2)

## 2010-05-29 LAB — LIPID PANEL
Cholesterol: 153 mg/dL (ref 0–200)
Total CHOL/HDL Ratio: 4.8 Ratio
Triglycerides: 234 mg/dL — ABNORMAL HIGH (ref <150)

## 2010-05-29 LAB — POCT GLYCOSYLATED HEMOGLOBIN (HGB A1C): Hemoglobin A1C: 11.2

## 2010-05-29 MED ORDER — SIMVASTATIN 20 MG PO TABS: 20.0000 mg | ORAL_TABLET | Freq: Every day | ORAL | Status: DC

## 2010-05-29 MED ORDER — DOXYCYCLINE HYCLATE 100 MG PO TABS: 100.0000 mg | ORAL_TABLET | Freq: Two times a day (BID) | ORAL | Status: AC

## 2010-05-29 MED ORDER — METFORMIN HCL 850 MG PO TABS: 850.0000 mg | ORAL_TABLET | Freq: Two times a day (BID) | ORAL | Status: DC

## 2010-05-29 MED ORDER — HYDROCHLOROTHIAZIDE 25 MG PO TABS: 25.0000 mg | ORAL_TABLET | Freq: Every day | ORAL | Status: DC

## 2010-05-29 MED ORDER — AMLODIPINE BESYLATE 5 MG PO TABS: 5.0000 mg | ORAL_TABLET | Freq: Every day | ORAL | Status: DC

## 2010-05-29 MED ORDER — INSULIN GLARGINE 100 UNIT/ML ~~LOC~~ SOLN: 20.0000 [IU] | Freq: Every day | SUBCUTANEOUS | Status: DC

## 2010-05-29 MED ORDER — GLIPIZIDE 10 MG PO TABS: 10.0000 mg | ORAL_TABLET | Freq: Two times a day (BID) | ORAL | Status: DC

## 2010-05-29 NOTE — Assessment & Plan Note (Signed)
Has been out of Lantus and other meds recently since being laid off from job at [employer] in November. Has only restarted Lantus 1 week ago with samples.  I have discussed with him the MAP program and Project Access, we are getting him paperwork to enroll in these immediately.  He is working part-time but having difficulty affording Lantus.  2 more sample pens with needles today.  He knows that he needs to make the medication assistance program his top priority to ensure access to Lantus.  All meds refilled today.  His A1C today is 11%, will recheck and adjust insulin once he is back on the Lantus regularly.   Has had diabetic retinal exam 6 months ago, reports that was unremarkable, told to come back in 1 year.

## 2010-05-29 NOTE — Assessment & Plan Note (Addendum)
Refilling meds; recheck labs. Continue Zocor as prescribed.

## 2010-05-29 NOTE — Progress Notes (Signed)
  Subjective:    Patient ID: Lawrence [name], male    DOB: [birth year], 50 y.o.   MRN: [id number]  HPI Comments: Lawrence [name] is here today to re-establish care.  Lost his job at [employer] in November, has been without Lantus until last week when he came to our office for samples.  Sugars in the 400s since then.    Slipped and skinned his R heel about 1 month ago.  Has been under the care of the [facility], Dr Cheryll Cockayne.  I received reports from Dr Cheryll Cockayne documenting marked improvement in the wound surface area.  Doxycycline prescribed by wound center, Wilhelm is taking this without problems.  Has new parttime job at [employer], does not qualify for benefits.  Would like to get into MAP and Project Access programs for med assistance.   Never-smoker.   Had dilated eye exam with Dr Luciana Axe 6 months ago, told no retinal disease.  Follow up 1 year later.       Review of Systems  Constitutional: Positive for unexpected weight change. Negative for fever and fatigue.       Has had some weight loss since sugars uncontrolled, not intentional weight loss.  Respiratory: Negative for cough, chest tightness, shortness of breath and wheezing.   Cardiovascular: Negative for chest pain, palpitations and leg swelling.  Gastrointestinal: Negative for abdominal distention.  Genitourinary: Negative for dysuria, urgency, decreased urine volume, enuresis and difficulty urinating.       Objective:   Physical Exam  Constitutional: He appears well-developed and well-nourished. No distress.  HENT:  Head: Normocephalic.       Neck supple. No anterior cervical adenopathy  Cardiovascular: Normal rate, regular rhythm and normal heart sounds.  Exam reveals no gallop and no friction rub.   No murmur heard. Pulmonary/Chest: Effort normal and breath sounds normal. No respiratory distress. He has no wheezes. He has no rales. He exhibits no tenderness.  Musculoskeletal:       R foot in walker, wrapped.     Skin: Skin is warm and dry. He is not diaphoretic.  Psychiatric: He has a normal mood and affect.          Assessment & Plan:

## 2010-05-29 NOTE — Patient Instructions (Signed)
Lawrence [name], it was a pleasure to see you again.  I am glad you are back so that we can regain control of your diabetes and cholesterol.  YOur blood pressure is well controlled today.   I am glad you are under Dr. Norman Clay care for your right heel wound.   I am drawing your lipid panel and metabolic panel today.  Even though I know they will likely not be controlled, I would like to make sure we know your starting point before getting you back on track.   I recommend a dilated eye exam to look for diabetic retinal (eye) damage, as soon as this is possible.   I would like you to apply for the Project Access [facility] Card) with Lawrence [name] at [facility]).  YOu may also apply for medication assistance through the MAP (Medication Assistance Program) at [facility], [address].  We will give you information about how to do these two very important things immediately.   I would like to see you back in 4 to 6 weeks.  PLEASE GIVE [name] THE INFO SHEET FOR PROJECT ACCESS/[name], AS WELL AS MAP PROGRAM.   FOLLOW UP WITH DR [name] IN 4 TO 6 WEEKS.

## 2010-06-01 ENCOUNTER — Encounter: Payer: Self-pay | Admitting: Family Medicine

## 2010-06-01 ENCOUNTER — Other Ambulatory Visit: Payer: Self-pay | Admitting: Family Medicine

## 2010-06-01 NOTE — Telephone Encounter (Signed)
Refill request

## 2010-06-02 NOTE — Procedures (Unsigned)
DUPLEX DEEP VENOUS EXAM - LOWER EXTREMITY  INDICATION:  Lower extremity ulcer.  HISTORY:  Edema:  No. Trauma/Surgery:  Left heel ulcer for 3 weeks as a result of scraping skin, per the patient. Pain:  No. PE:  No. Previous DVT:  No. Anticoagulants: Other:  DUPLEX EXAM:               CFV   SFV   PopV  PTV    GSV               R  L  R  L  R  L  R   L  R  L Thrombosis    o  o     o     o      o     o Spontaneous   +  +     +     +      +     + Phasic        +  +     +     +      +     + Augmentation  +  +     +     +      +     + Compressible  +  +     +     +      +     + Competent     +  +     +     +      +     +  Legend:  + - yes  o - no  p - partial  D - decreased  IMPRESSION: 1. There is no evidence of clinically significant venous reflux, deep     vein thrombosis or superficial vein thrombosis noted throughout the     left lower extremity. 2. The patient declined to have the right lower extremity evaluated     during this exam.   _____________________________ Larina Earthly, M.D.  CH/MEDQ  D:  05/22/2010  T:  05/22/2010  Job:  [id number]

## 2010-06-09 ENCOUNTER — Other Ambulatory Visit: Payer: Self-pay | Admitting: Family Medicine

## 2010-06-09 NOTE — Telephone Encounter (Signed)
Refill request

## 2010-06-11 ENCOUNTER — Encounter (HOSPITAL_BASED_OUTPATIENT_CLINIC_OR_DEPARTMENT_OTHER): Payer: Self-pay | Attending: Internal Medicine

## 2010-06-11 DIAGNOSIS — L97409 Non-pressure chronic ulcer of unspecified heel and midfoot with unspecified severity: Secondary | ICD-10-CM | POA: Insufficient documentation

## 2010-06-11 DIAGNOSIS — Z79899 Other long term (current) drug therapy: Secondary | ICD-10-CM | POA: Insufficient documentation

## 2010-06-11 DIAGNOSIS — I10 Essential (primary) hypertension: Secondary | ICD-10-CM | POA: Insufficient documentation

## 2010-06-11 DIAGNOSIS — Z794 Long term (current) use of insulin: Secondary | ICD-10-CM | POA: Insufficient documentation

## 2010-06-11 DIAGNOSIS — E1169 Type 2 diabetes mellitus with other specified complication: Secondary | ICD-10-CM | POA: Insufficient documentation

## 2010-06-23 ENCOUNTER — Encounter: Payer: Self-pay | Admitting: Sports Medicine

## 2010-06-26 ENCOUNTER — Ambulatory Visit (INDEPENDENT_AMBULATORY_CARE_PROVIDER_SITE_OTHER): Payer: Self-pay | Admitting: Family Medicine

## 2010-06-26 ENCOUNTER — Encounter: Payer: Self-pay | Admitting: Family Medicine

## 2010-06-26 VITALS — BP 132/84 | HR 81 | Temp 97.9°F | Ht 65.0 in | Wt 195.0 lb

## 2010-06-26 DIAGNOSIS — I10 Essential (primary) hypertension: Secondary | ICD-10-CM

## 2010-06-26 DIAGNOSIS — E1169 Type 2 diabetes mellitus with other specified complication: Secondary | ICD-10-CM

## 2010-06-26 DIAGNOSIS — E119 Type 2 diabetes mellitus without complications: Secondary | ICD-10-CM

## 2010-06-26 DIAGNOSIS — E11621 Type 2 diabetes mellitus with foot ulcer: Secondary | ICD-10-CM

## 2010-06-26 DIAGNOSIS — L98499 Non-pressure chronic ulcer of skin of other sites with unspecified severity: Secondary | ICD-10-CM

## 2010-06-26 MED ORDER — LISINOPRIL 20 MG PO TABS: 20.0000 mg | ORAL_TABLET | Freq: Every day | ORAL | Status: DC

## 2010-06-26 NOTE — Assessment & Plan Note (Signed)
Reports improvement in his left foot ulcer.  Continues to go to [facility] twice weekly.  Is told he is making good progress and should close in the coming 4 to 6 weeks.

## 2010-06-26 NOTE — Progress Notes (Signed)
  Subjective:    Patient ID: Lawrence [name], male    DOB: 1960-12-12, 50 y.o.   MRN: [id number]  HPI Reports feeling well.  Has been taking meds since our last visit; has run out of insulin on occasion and has not been to MAP or Project Access to present his paperwork to get subsidized medications.  Lost his part-time job at [employer], due to problems with L foot.  Continues to see Wound Center twice weekly for L foot ulceration, told is making good progress.  Couldn't stay on his feet all the time and so yesterday was his last day of work until future notice.  Was told by his boss that he could come back when he gets better.    Review of Systems Denies chest pain, dyspnea, polydipsia or polyuria.  Does not check sugar at home.  Admits to dietary indiscretion when at work (eats something fast and gets back to work).     Objective:   Physical Exam Well appearing, no apparent distress.  Neck supple. Moist mucus membranes.  COR RRR, no extra sounds  PULM Clear bilaterally L foot wrapped in supportive splint.        Assessment & Plan:

## 2010-06-26 NOTE — Consult Note (Signed)
Lawrence [name], Lawrence [name]                 ACCOUNT NO.:  [id number]   MEDICAL RECORD NO.:  [id number]          PATIENT TYPE:  INP   LOCATION:  2004                         FACILITY:  [facility]   PHYSICIAN:  Doylene Canning. Ladona Ridgel, M.D.  DATE OF BIRTH:  1960/12/31   DATE OF CONSULTATION:  DATE OF DISCHARGE:                                   CONSULTATION   REASON FOR CONSULTATION:  Evaluation of tachycardia.   HISTORY OF PRESENT ILLNESS:  The patient is a 50 year old young man who was  admitted to the hospital with nausea, vomiting, and groin pain and  subsequently found to have a severe cellulitis in his groin.  He has a long  history of diabetes which has been fairly poorly controlled.  He is admitted  to the hospital.  Apparently, blood cultures have been sterile.  The patient  has been febrile with elevated white blood cell counts.  He notes that for  the several days prior to his admission, he had very poor intake and was  severely nauseated.  He denies syncope.  He denies a history of  palpitations.  His past medical history is notable for a diabetes for over  20 years.  In the past, he has been on Glucophage and Glucotrol with very  poor apparent medical control.  His laboratory evaluation demonstrated a  hemoglobin A1C of 12.8.  Additional past medical history is notable for  hyperlipidemia.  He underwent a 2D echo demonstrating what appeared to be  probably preserved LV systolic function.   SOCIAL HISTORY:  The patient lives in [place] with his mother.  He works  as a Physicist, medical.  He denies tobacco use.  He rarely drinks  alcohol.   FAMILY HISTORY:  Notable for mother who is 89 and father who died in his 52s  of cancer.  None of his mother's siblings have coronary artery disease.   REVIEW OF SYMPTOMS:  Negative for fever, chills, night sweats.  He denies  headache, vision or hearing problems.  He denies bleeding problems.  He  denies skin rashes or lesions.  He denies chest  pain, dyspnea, PND,  orthopnea, or peripheral edema.  He denies cough or hemoptysis.  He denies  dysuria, hematuria, or nocturia.  He denies weakness, numbness, or mood  disorder.  He denies arthralgias or arthritis.  He denies nausea, vomiting,  diarrhea, constipation except with his present medical problem.  The rest of  the review of systems was negative.   PHYSICAL EXAMINATION:  GENERAL:  Pleasant, middle aged man who looks somewhat older than his stated  age.  VITAL SIGNS:  Blood pressure 131/83, pulse 102 and regular, respirations 20,  temperature 101.  HEENT:  Normocephalic, atraumatic, pupils equal and round, oropharynx moist,  sclerae anicteric.  He was somewhat unkempt appearing.  NECK:  No jugular venous distention, there is no thyromegaly, the trachea  was midline.  LUNGS:  Clear to auscultation bilaterally.  HEART:  Regular rate and rhythm with normal S1 and S2.  ABDOMEN:  Obese, nontender, nondistended.  No organomegaly.  EXTREMITIES:  No  cyanosis, clubbing, and edema.  His groin region was not  examined.  Pulses 2+ and symmetric.  NEUROLOGICAL:  Alert and oriented x 3 with cranial nerves 2-12 grossly  intact, strength 5/5 and symmetric.   LABORATORY DATA:  EKG demonstrates normal sinus rhythm with sinus  tachycardia and no obvious ST-T wave abnormalities.  Additional laboratory  evaluation demonstrates normal TSH and elevated hemoglobin A1C as previously  noted.   IMPRESSION:  1.  Sinus tachycardia.  2.  Cellulitis.  3.  Diabetes.  4.  Dyslipidemia.   DISCUSSION:  The patient's sinus tachycardia is almost certainly secondary  to a combination of volume depletion from prolonged nausea and vomiting and  decreased p.o. intake as well as a hypermetabolic state secondary to his  fever.  The patient does not have an abnormal thyroid by TSH.  I have  recommended that he undergo continued volume repletion.  With time and  control of his fever and underlying infection,  his heart rate should  improve.  There can certainly be rare instances where patient's with long-  standing diabetes develop autonomic dysfunction and sinus tachycardia from  this and that certainly could be playing a roll here, though I suspect that  his heart racing is most likely secondary to his underlying infection and  volume depletion.  Though he has multiple cardiac risk factors, I do not  feel strongly about recommending outpatient risk stratification at the  present time.       GWT/MEDQ  D:  12/02/2003  T:  12/02/2003  Job:  [id number]   cc:   Estell Harpin, M.D.  [address]  [place] [address]  Fax: [phone]

## 2010-06-26 NOTE — H&P (Signed)
NAMEBIRCH, FARINO                 ACCOUNT NO.:  [id number]   MEDICAL RECORD NO.:  [id number]          PATIENT TYPE:  EMS   LOCATION:  MAJO                         FACILITY:  [facility]   PHYSICIAN:  Deirdre Peer. Polite, M.D. DATE OF BIRTH:  [birth year]   DATE OF ADMISSION:  11/26/2003  DATE OF DISCHARGE:                                HISTORY & PHYSICAL   CHIEF COMPLAINT:  Nausea and vomiting, left groin pain.   HISTORY OF PRESENT ILLNESS:  A 50 year old male with a history of diabetes  greater than 20 years who presents to the ED with complaints of nausea and  vomiting multiple times today.  Denied any abdominal pain.  Denied any fever  or chills.  The patient also complained of left groin pain since three to  four days ago.  The patient also has noted swelling and erythema in that  area.  Please note the patient is a diabetic.  He states that he has been  noncompliant with his medications for approximately three weeks secondary to  no money.  In the ED, the patient was evaluated and found to have early  cellulitis in this leg by CT scan with no obvious abscess. The patient was  found to have hyperglycemia greater than 300 as well as metabolic acidosis.  [facility] Hospitalist was called for further evaluation. At the time of my  evaluation, the patient was alert and oriented x3, obviously smelling  ketotic.  The patient does corroborate that he has been noncompliant with  his medications for three weeks secondary to inability to pay for his  medications. He denies any fever or chills.  Denies any prior history of  DKA.  Admission is deemed necessary for further evaluation and treatment of  metabolic acidosis and cellulitis in the left groin.   PAST MEDICAL HISTORY:  As stated above for diabetes for greater than 20  years.  The patient denies any neuropathy or nephropathy or retinopathy.  He  denies any hypertension, denies MI, denies high cholesterol.   MEDICATIONS:  The patient states he is  supposed to take Glucophage 850 mg  b.i.d. and Glucotrol XL 10 mg daily.  He states that he does not an aspirin  a day.   SOCIAL HISTORY:  Denies any tobacco.  Does admit to social alcohol.  No  drugs.   ALLERGIES:  No known drug allergies.   PAST SURGICAL HISTORY:  Negative.   FAMILY HISTORY:  Mother without any medical problems.  Father deceased  secondary to carcinoma.  A sister is healthy.   REVIEW OF SYSTEMS:  The patient denies any polyuria or polydipsia but does  admit to nausea and vomiting for one day.  Denies any fever or chills.  Denies any abdominal pain.  No weight loss.  Does admit to pain and redness  in the left groin for three to further days.   PHYSICAL EXAMINATION:  GENERAL:  Alert, oriented x3.  Mild distress.  VITAL SIGNS: Temperature 99.3, BP 146/87, heart rate 134, respiratory rate  20.  HEENT:  Significant for very, very dry oral mucosa.  NECK:  No JVD.  CHEST:  Clear to auscultation.  CARDIAC:  S1, S2 regular.  ABDOMEN:  Obese, nontender, no hepatosplenomegaly.  EXTREMITIES:  No edema, no clubbing, no cyanosis, no obvious foot ulcers or  wounds.  The patient's left groin is significant for induration, erythema,  and warmth.   LABORATORY DATA:  CBC showed white count  14.9, hemoglobin 13.8, platelets  181, neutrophil count 86%.  BMET showed sodium 132, potassium 3.5, chloride  105, BUN 9, glucose 304, CO2 of 11. ABG showed pH 7.28. CT scan of the left  groin consistent with marked cellulitis.  No evidence of abscess.   ASSESSMENT:  1.  Mild diabetic ketoacidosis.  2.  Left groin cellulitis.  3.  Diabetes.  The patient has been noncompliant with medications  for      greater than three weeks.  4.  Dehydration.  5.  Leukocytosis.   RECOMMENDATIONS:  Be admitted to a medical bed for evaluation and treatment  of early DKA as well as early cellulitis.  Most likely cause of this is  noncompliance with the patient's diabetic medications.  The patient  will be  treated with IV fluids, IV insulin.  Will replete electrolytes as needed.  Will start the patient on IV antibiotics for early cellulitis.  Will obtain  baseline labs; i.e, hemoglobin A1C, TSH and lipids.  Will followed BMET  serially.  Will make further recommendations after review of the above  studies.      Joen Laura   RDP/MEDQ  D:  11/27/2003  T:  11/27/2003  Job:  [id number]

## 2010-06-26 NOTE — Assessment & Plan Note (Signed)
Patient had previously been on lisinopril, which was discontinued in Feb 2010 (unclear why from [facility] notes). Patient does not recall any side effects at that time.  Will stop Norvasc and start lisinopril 20mg  daily.

## 2010-06-26 NOTE — Assessment & Plan Note (Signed)
Has been using Lantus when he has it available.  Has not presented paperwork for MAP or Project Access yet.  We have no Lantus samples in stock today, therefore unable to give today.  Discussed dietary changes, which have been difficult during work at [employer].

## 2010-06-26 NOTE — Discharge Summary (Signed)
NAMEABRHAM, MASLOWSKI                 ACCOUNT NO.:  [id number]   MEDICAL RECORD NO.:  [id number]          PATIENT TYPE:  INP   LOCATION:  2004                         FACILITY:  [facility]   PHYSICIAN:  Jackie Plum, M.D.DATE OF BIRTH:  24-Mar-1960   DATE OF ADMISSION:  11/26/2003  DATE OF DISCHARGE:  12/03/2003                                 DISCHARGE SUMMARY   DISCHARGE DIAGNOSES:  1.  Mild diabetic ketoacidosis, resolved.  2.  Left groin cellulitis, improved.  3.  History of diabetes mellitus with noncompliance.  4.  History of dyslipidemia.   DISCHARGE MEDICATIONS:  1.  Augmentin 825 mg b.i.d.  2.  Lantus insulin 20 units q.h.s.  3.  Zocor 20 mg q.h.s.  4.  Glucophage 1000 mg b.i.d.  5.  Glucotrol XL 10 mg b.i.d.  6.  Darvocet one to two tablets q.4h. p.r.n. pain.   ACTIVITY:  The patient was asked to return to work in 1 week.   DIET:  Diabetic, low cholesterol diet.   FOLLOW UP:  Follow up with Dr. Eulah Pont at clinic.  Follow up with Dr. Farris Has  on December 14, 2003, his primary care physician in 1-2 weeks.   DISCHARGE LABORATORY DATA AND X-RAY FINDINGS:  WBC 9.3, hemoglobin 10.5,  hematocrit 30.9, MCV 83.4, platelet count 253.  Sodium 140, potassium 3.4,  chloride 105, CO2 28, glucose 184, BUN 5, creatinine 0.6, calcium 8.4,  phosphorus 3.1, magnesium 1.6.   CONSULTATIONS:  Dr. Ladona Ridgel, cardiology.   CONDITION ON DISCHARGE:  Improved and satisfactory.   REASON FOR ADMISSION:  The patient was admitted by Dr. Trula Slade after  presenting with nausea, vomiting and left groin pain.  He was found to have  cellulitis in the left groin area.  He had a CT scan done which reviewed no  abscess.  The patient was found to have hyperglycemia of more than 300 with  metabolic acidosis.  He was therefore admitted for further evaluation of his  DKA which was deemed secondary to the left groin cellulitis.   HOSPITAL COURSE:  The patient was started on IV insulin drip with IV fluid  supplementation as well as antibiotics for his cellulitis.  He had some  electrolytes which were appropriately repleted.  By December 02, 2003, the  patient was feeling well overall and ready for discharge.  However, he  continued to be tachycardic.  In view of persistent tachycardia, we  consulted Dr. Ladona Ridgel of cardiology and also ordered a 2D echocardiogram the  same day.  The patient was seen in consultation by Dr. Ladona Ridgel who thought  that the patient's tachycardia was due to volume depletion with fluid shifts  and advised continued rehydration.  The patient was rehydrated overnight and  the next  morning, the patient was seen by Dr. Cammie Mcgee L. Lendell Caprice, at which time his  heart rate had come down and was discharged home in stable and satisfactory  condition by Dr. Lendell Caprice.  His discharge temperature was 99.0 degrees  Fahrenheit with a heart rate of 98 per minute, respirations 18, BP of  138/89.  GO/MEDQ  D:  05/09/2004  T:  05/09/2004  Job:  [id number]

## 2010-06-26 NOTE — Patient Instructions (Signed)
It was a pleasure to see you today.  I am sorry that we do not have any samples of Lantus insulin today.   I strongly recommend that you submit the paperwork for the [facility] Card and/or MAP program, in order to get the Lantus on a regular basis.   I discontinued your Norvasc medicine today (blood pressure med).  We started you on lisinopril 20mg  one time daily; this is a low-cost generic medicine and is a better option for patients with Diabetes.   I would like to have you check your blood pressures at the pharmacy or at home, and call me if your readings are above 130/80 on a regular basis.   I would like to see you back in late July, at which time we will recheck your Hemoglobin A1C reading.

## 2010-07-10 ENCOUNTER — Encounter (HOSPITAL_BASED_OUTPATIENT_CLINIC_OR_DEPARTMENT_OTHER): Payer: Self-pay | Attending: Internal Medicine

## 2010-07-10 DIAGNOSIS — I10 Essential (primary) hypertension: Secondary | ICD-10-CM | POA: Insufficient documentation

## 2010-07-10 DIAGNOSIS — Z794 Long term (current) use of insulin: Secondary | ICD-10-CM | POA: Insufficient documentation

## 2010-07-10 DIAGNOSIS — Z79899 Other long term (current) drug therapy: Secondary | ICD-10-CM | POA: Insufficient documentation

## 2010-07-10 DIAGNOSIS — E1169 Type 2 diabetes mellitus with other specified complication: Secondary | ICD-10-CM | POA: Insufficient documentation

## 2010-07-10 DIAGNOSIS — L97409 Non-pressure chronic ulcer of unspecified heel and midfoot with unspecified severity: Secondary | ICD-10-CM | POA: Insufficient documentation

## 2010-07-13 ENCOUNTER — Telehealth: Payer: Self-pay | Admitting: Family Medicine

## 2010-07-13 MED ORDER — LISINOPRIL 40 MG PO TABS: 40.0000 mg | ORAL_TABLET | Freq: Every day | ORAL | Status: DC

## 2010-07-13 NOTE — Telephone Encounter (Signed)
Call noted.  I have emphasized with Lawrence [name] the need to apply for Project Access with [facility], as this will help with medication access/MAP program, etc. [name]

## 2010-07-13 NOTE — Telephone Encounter (Signed)
Returned call, Lawrence [name] reports SBP in the 150s-160 at wound care center.  Taking HCTZ and lisinopril 20mg  daily as prescribed.  No other changes.  Will increase lisinopril to 40mg  daily, plan to check BMet at next visit in the coming month. He agrees with plan, will call back if BP not better controlled before next visit.

## 2010-07-13 NOTE — Telephone Encounter (Signed)
Patient would like you to call him regarding his blood pressure.  He said that it is going up instead of down.

## 2010-07-13 NOTE — Telephone Encounter (Signed)
Called and informed, that I have one bottle of Insulin for him to pick up. Lorenda Hatchet, Renato Battles

## 2010-07-13 NOTE — Telephone Encounter (Signed)
Pt wants to know if we have any samples of insulin? Ran out and does not have any money.

## 2010-08-10 ENCOUNTER — Encounter (HOSPITAL_BASED_OUTPATIENT_CLINIC_OR_DEPARTMENT_OTHER): Payer: Self-pay | Attending: Internal Medicine

## 2010-08-10 DIAGNOSIS — Z794 Long term (current) use of insulin: Secondary | ICD-10-CM | POA: Insufficient documentation

## 2010-08-10 DIAGNOSIS — I2510 Atherosclerotic heart disease of native coronary artery without angina pectoris: Secondary | ICD-10-CM | POA: Insufficient documentation

## 2010-08-10 DIAGNOSIS — I10 Essential (primary) hypertension: Secondary | ICD-10-CM | POA: Insufficient documentation

## 2010-08-10 DIAGNOSIS — Z79899 Other long term (current) drug therapy: Secondary | ICD-10-CM | POA: Insufficient documentation

## 2010-08-10 DIAGNOSIS — E1169 Type 2 diabetes mellitus with other specified complication: Secondary | ICD-10-CM | POA: Insufficient documentation

## 2010-08-10 DIAGNOSIS — L97409 Non-pressure chronic ulcer of unspecified heel and midfoot with unspecified severity: Secondary | ICD-10-CM | POA: Insufficient documentation

## 2010-08-25 ENCOUNTER — Ambulatory Visit (INDEPENDENT_AMBULATORY_CARE_PROVIDER_SITE_OTHER): Payer: Self-pay | Admitting: Family Medicine

## 2010-08-25 ENCOUNTER — Encounter: Payer: Self-pay | Admitting: Family Medicine

## 2010-08-25 VITALS — BP 130/80 | HR 97 | Ht 65.0 in | Wt 190.0 lb

## 2010-08-25 DIAGNOSIS — G5691 Unspecified mononeuropathy of right upper limb: Secondary | ICD-10-CM | POA: Insufficient documentation

## 2010-08-25 DIAGNOSIS — G629 Polyneuropathy, unspecified: Secondary | ICD-10-CM

## 2010-08-25 DIAGNOSIS — G589 Mononeuropathy, unspecified: Secondary | ICD-10-CM

## 2010-08-25 DIAGNOSIS — E119 Type 2 diabetes mellitus without complications: Secondary | ICD-10-CM

## 2010-08-25 DIAGNOSIS — I10 Essential (primary) hypertension: Secondary | ICD-10-CM

## 2010-08-25 LAB — BASIC METABOLIC PANEL
CO2: 22 mEq/L (ref 19–32)
Glucose, Bld: 267 mg/dL — ABNORMAL HIGH (ref 70–99)
Potassium: 4.4 mEq/L (ref 3.5–5.3)
Sodium: 137 mEq/L (ref 135–145)

## 2010-08-25 MED ORDER — GABAPENTIN 100 MG PO CAPS: 100.0000 mg | ORAL_CAPSULE | Freq: Three times a day (TID) | ORAL | Status: DC

## 2010-08-25 NOTE — Progress Notes (Signed)
  Subjective:    Patient ID: Lawrence [name], male    DOB: 07-Aug-1960, 50 y.o.   MRN: [id number]  HPI Lawrence [name] comes back today for follow up of his Diabetes.  He has been going to the wound care center, and the wound on his L foot is nearly completely healed.  Expects to get the green light to discontinue treatment there in the coming week.  Still unemployed.  Plans to return working at [employer] or possibly even back at [employer] in the next 2 weeks. That said, he expects he will be without benefits for about 1 year after going back to work.  Has not yet met with Rudell Cobb, says his accountant will not release a copy of his tax returns, which is needed by Rudell Cobb ("'cause I owe the accountant money.")  Denies chest pain, no dyspnea, no chest pressure. Ran out of Lantus 2 weeks ago, cannot afford it now.     Review of Systems No chest pain; no polyuria or polydipsia     Objective:   Physical Exam Well appearing, no apparent distress. Moist mucus membranes. HEENT Neck supple, no cervical adenopathy COR RRR, no extra sounds PULM Clear bilaterally. No rales or wheezes L foot wrapped.        Assessment & Plan:

## 2010-08-25 NOTE — Assessment & Plan Note (Signed)
To continue current therapy.  Urged to see [employer].Marland Kitchen

## 2010-08-25 NOTE — Assessment & Plan Note (Signed)
Needs to speak with Lawrence [name] in order to qualify for [facility], MAP program, etc.  I gave him 4 boxes of samples (total of 400 units), he uses 20units/day.  Urged to do this, as he expects to be without insurance for about 1 yr more.

## 2010-08-25 NOTE — Patient Instructions (Signed)
It was a pleasure to see you today.    I am giving you the samples that I have available of Lantus (4 boxes).  It is extremely important that you make an appointment with Rudell Cobb, in order to qualify for Medication Assistance and [facility] and discounts.  For your neuropathy, I am prescribing Gabapentin 100mg  Capsules, Take 1 capsules three times daily.  This is available on the discount drug plans of [facility] and [facility].  Take the paper script to the pharmacy of your choice.  The neuropathy tends to get worse with poor sugar control, and may get better with better sugar control.   I would like to see you back in 4 months or sooner as needed.

## 2010-08-26 ENCOUNTER — Encounter: Payer: Self-pay | Admitting: Family Medicine

## 2010-09-14 ENCOUNTER — Encounter (HOSPITAL_BASED_OUTPATIENT_CLINIC_OR_DEPARTMENT_OTHER): Payer: Self-pay | Attending: Internal Medicine

## 2010-09-14 DIAGNOSIS — L97409 Non-pressure chronic ulcer of unspecified heel and midfoot with unspecified severity: Secondary | ICD-10-CM | POA: Insufficient documentation

## 2010-09-14 DIAGNOSIS — Z794 Long term (current) use of insulin: Secondary | ICD-10-CM | POA: Insufficient documentation

## 2010-09-14 DIAGNOSIS — Z79899 Other long term (current) drug therapy: Secondary | ICD-10-CM | POA: Insufficient documentation

## 2010-09-14 DIAGNOSIS — E1169 Type 2 diabetes mellitus with other specified complication: Secondary | ICD-10-CM | POA: Insufficient documentation

## 2010-09-14 DIAGNOSIS — I10 Essential (primary) hypertension: Secondary | ICD-10-CM | POA: Insufficient documentation

## 2010-09-14 DIAGNOSIS — I2510 Atherosclerotic heart disease of native coronary artery without angina pectoris: Secondary | ICD-10-CM | POA: Insufficient documentation

## 2010-11-05 LAB — BASIC METABOLIC PANEL
BUN: 15
CO2: 22
Calcium: 9.8
Chloride: 101
Creatinine, Ser: 0.89
GFR calc Af Amer: >60
GFR calc non Af Amer: >60
Glucose, Bld: 292 — ABNORMAL HIGH
Potassium: 4.2
Sodium: 136

## 2010-11-05 LAB — CBC
HCT: 40.5
Hemoglobin: 13.8
MCHC: 34.1
MCV: 83.4
Platelets: 154
RBC: 4.85
RDW: 13.5
WBC: 8.7

## 2010-11-05 LAB — DIFFERENTIAL
Basophils Absolute: 0
Basophils Relative: 0
Eosinophils Absolute: 0.1
Eosinophils Relative: 1
Lymphocytes Relative: 15
Lymphs Abs: 1.3
Monocytes Absolute: 0.8
Monocytes Relative: 9
Neutro Abs: 6.5
Neutrophils Relative %: 74

## 2011-01-16 ENCOUNTER — Encounter (HOSPITAL_COMMUNITY): Payer: Self-pay | Admitting: *Deleted

## 2011-01-16 ENCOUNTER — Emergency Department (HOSPITAL_COMMUNITY)
Admission: EM | Admit: 2011-01-16 | Discharge: 2011-01-17 | Disposition: A | Discharge status: Home / Self Care | Payer: Self-pay | Attending: Emergency Medicine | Admitting: Emergency Medicine

## 2011-01-16 DIAGNOSIS — R05 Cough: Secondary | ICD-10-CM | POA: Insufficient documentation

## 2011-01-16 DIAGNOSIS — I10 Essential (primary) hypertension: Secondary | ICD-10-CM | POA: Insufficient documentation

## 2011-01-16 DIAGNOSIS — R059 Cough, unspecified: Secondary | ICD-10-CM | POA: Insufficient documentation

## 2011-01-16 DIAGNOSIS — N390 Urinary tract infection, site not specified: Secondary | ICD-10-CM | POA: Insufficient documentation

## 2011-01-16 DIAGNOSIS — Z794 Long term (current) use of insulin: Secondary | ICD-10-CM | POA: Insufficient documentation

## 2011-01-16 DIAGNOSIS — E119 Type 2 diabetes mellitus without complications: Secondary | ICD-10-CM | POA: Insufficient documentation

## 2011-01-16 HISTORY — DX: Essential (primary) hypertension: I10

## 2011-01-16 NOTE — ED Notes (Signed)
[id number]<AV> Expected date:<BR> Expected time:<BR> Means of arrival:<BR> Comments:<BR> CBG 420

## 2011-01-16 NOTE — ED Notes (Signed)
Pt came in via EMS,  With complaints of hyperglycemia,  420 enroute via EMS,  Denies nausea and vomiting admits to urinary frequency,  Non compliant with medications due to money,  He should be taking Lantus, metformin and glipizide

## 2011-01-17 ENCOUNTER — Emergency Department (HOSPITAL_COMMUNITY): Payer: Self-pay

## 2011-01-17 LAB — COMPREHENSIVE METABOLIC PANEL
Albumin: 3.5 g/dL (ref 3.5–5.2)
Alkaline Phosphatase: 129 U/L — ABNORMAL HIGH (ref 39–117)
BUN: 17 mg/dL (ref 6–23)
CO2: 24 mEq/L (ref 19–32)
Chloride: 96 mEq/L (ref 96–112)
Glucose, Bld: 485 mg/dL — ABNORMAL HIGH (ref 70–99)
Potassium: 3.9 mEq/L (ref 3.5–5.1)
Total Bilirubin: 0.4 mg/dL (ref 0.3–1.2)

## 2011-01-17 LAB — CBC
Hemoglobin: 13.4 g/dL (ref 13.0–17.0)
RBC: 4.72 MIL/uL (ref 4.22–5.81)
WBC: 12.3 10*3/uL — ABNORMAL HIGH (ref 4.0–10.5)

## 2011-01-17 LAB — URINALYSIS, ROUTINE W REFLEX MICROSCOPIC
Glucose, UA: >1000 mg/dL — AB
Protein, ur: 100 mg/dL — AB

## 2011-01-17 LAB — GLUCOSE, CAPILLARY: Glucose-Capillary: 297 mg/dL — ABNORMAL HIGH (ref 70–99)

## 2011-01-17 LAB — DIFFERENTIAL
Basophils Relative: 0 % (ref 0–1)
Lymphocytes Relative: 6 % — ABNORMAL LOW (ref 12–46)
Lymphs Abs: 0.8 10*3/uL (ref 0.7–4.0)
Monocytes Relative: 9 % (ref 3–12)
Neutro Abs: 10.3 10*3/uL — ABNORMAL HIGH (ref 1.7–7.7)
Neutrophils Relative %: 84 % — ABNORMAL HIGH (ref 43–77)

## 2011-01-17 LAB — URINE MICROSCOPIC-ADD ON

## 2011-01-17 MED ORDER — CEFTRIAXONE SODIUM 1 G IJ SOLR
1.0000 g | Freq: Once | INTRAMUSCULAR | Status: AC
  Administered 2011-01-17: 1 g via INTRAVENOUS
  Filled 2011-01-17: qty 10

## 2011-01-17 MED ORDER — METFORMIN HCL 1000 MG PO TABS: 1000.0000 mg | ORAL_TABLET | Freq: Two times a day (BID) | ORAL | Status: DC

## 2011-01-17 MED ORDER — INSULIN REGULAR HUMAN 100 UNIT/ML IJ SOLN: 20.0000 [IU] | Freq: Once | INTRAMUSCULAR | Status: DC

## 2011-01-17 MED ORDER — SODIUM CHLORIDE 0.9 % IV BOLUS (SEPSIS)
1000.0000 mL | Freq: Once | INTRAVENOUS | Status: AC
  Administered 2011-01-17: 1000 mL via INTRAVENOUS

## 2011-01-17 MED ORDER — METFORMIN HCL 500 MG PO TABS
1000.0000 mg | ORAL_TABLET | Freq: Once | ORAL | Status: AC
  Administered 2011-01-17: 1000 mg via ORAL
  Filled 2011-01-17: qty 2

## 2011-01-17 MED ORDER — INSULIN REGULAR HUMAN 100 UNIT/ML IJ SOLN: 10.0000 [IU] | Freq: Once | INTRAMUSCULAR | Status: DC

## 2011-01-17 MED ORDER — CEPHALEXIN 500 MG PO CAPS: 500.0000 mg | ORAL_CAPSULE | Freq: Four times a day (QID) | ORAL | Status: AC

## 2011-01-17 MED ORDER — INSULIN ASPART 100 UNIT/ML ~~LOC~~ SOLN
10.0000 [IU] | Freq: Once | SUBCUTANEOUS | Status: AC
  Administered 2011-01-17: 10 [IU] via SUBCUTANEOUS
  Filled 2011-01-17: qty 1

## 2011-01-17 MED ORDER — INSULIN ASPART 100 UNIT/ML ~~LOC~~ SOLN
20.0000 [IU] | Freq: Once | SUBCUTANEOUS | Status: AC
  Administered 2011-01-17: 20 [IU] via SUBCUTANEOUS
  Filled 2011-01-17: qty 1

## 2011-01-17 MED ORDER — KETOROLAC TROMETHAMINE 30 MG/ML IJ SOLN
30.0000 mg | Freq: Once | INTRAMUSCULAR | Status: AC
  Administered 2011-01-17: 30 mg via INTRAVENOUS
  Filled 2011-01-17: qty 1

## 2011-01-17 NOTE — ED Provider Notes (Signed)
History     CSN: [id number] Arrival date & time: 01/16/2011 11:31 PM   First MD Initiated Contact with Patient 01/17/11 0035      Chief Complaint  Patient presents with  . Hyperglycemia    none compliant with medications,  due to money    (Consider location/radiation/quality/duration/timing/severity/associated sxs/prior treatment) HPI Comments: Patient presents with generalized malaise and fatigue as well as urinary frequency. He has a history of diabetes and states he is unable to afford his medications. Has not taken any meds medications for the past 3 or 4 days. He last took Lantus. States when he does have medications he does not take them as directed he rather takes them sporadically in order to have them last longer. He also states she's had a cough which is been present for the past couple of weeks. He denies chest pain, shortness of breath, abdominal pain, vomiting. He does have some mild nausea. He is no additional urinary symptoms. He has no fever or chills.  The history is provided by the patient. No language interpreter was used.    Past Medical History  Diagnosis Date  . Diabetes mellitus   . Hypertension     History reviewed. No pertinent past surgical history.  History reviewed. No pertinent family history.  History  Substance Use Topics  . Smoking status: Never Smoker   . Smokeless tobacco: Never Used  . Alcohol Use: No      Review of Systems  Constitutional: Negative for fever, activity change and appetite change.  HENT: Positive for congestion. Negative for sore throat, rhinorrhea, neck pain and neck stiffness.   Respiratory: Positive for cough. Negative for shortness of breath and wheezing.   Cardiovascular: Negative for chest pain and palpitations.  Gastrointestinal: Positive for nausea. Negative for vomiting, abdominal pain, diarrhea and constipation.  Genitourinary: Positive for frequency. Negative for dysuria, urgency and flank pain.  Neurological:  Negative for dizziness, weakness, light-headedness, numbness and headaches.  All other systems reviewed and are negative.    Allergies  Review of patient's allergies indicates no known allergies.  Home Medications   Current Outpatient Rx  Name Route Sig Dispense Refill  . ASPIRIN 81 MG PO TABS Oral Take 81 mg by mouth daily.      Marland Kitchen GLUCOMETER ELITE CLASSIC KIT Does not apply by Does not apply route. Check glucose once daily as direced     . CEPHALEXIN 500 MG PO CAPS Oral Take 1 capsule (500 mg total) by mouth 4 (four) times daily. 40 capsule 0  . GABAPENTIN 100 MG PO CAPS Oral Take 1 capsule (100 mg total) by mouth 3 (three) times daily. 270 capsule 3  . GLIPIZIDE ER 10 MG PO TB24  TAKE ONE TABLET BY MOUTH TWICE DAILY 60 tablet 11  . GLIPIZIDE 10 MG PO TABS Oral Take 1 tablet (10 mg total) by mouth 2 (two) times daily before a meal. 180 tablet 3  . GLUCOSE BLOOD VI STRP Other 1 each by Other route as needed. Check home glucose once daily as directed     . HYDROCHLOROTHIAZIDE 25 MG PO TABS  TAKE ONE TABLET BY MOUTH IN THE MORNING 90 tablet 3  . INSULIN GLARGINE 100 UNIT/ML [place] SOLN Subcutaneous Inject 20 Units into the skin at bedtime. 10 mL 6  . LISINOPRIL 40 MG PO TABS Oral Take 1 tablet (40 mg total) by mouth daily. 30 tablet 6  . METFORMIN HCL 1000 MG PO TABS Oral Take 1 tablet (1,000 mg total)  by mouth 2 (two) times daily. 30 tablet 0  . METFORMIN HCL 850 MG PO TABS  TAKE ONE TABLET BY MOUTH TWICE DAILY 60 tablet 11  . SIMVASTATIN 20 MG PO TABS Oral Take 1 tablet (20 mg total) by mouth at bedtime. 90 tablet 3    BP 110/64  Pulse 100  Temp(Src) 99 F (37.2 C) (Oral)  Resp 16  SpO2 98%  Physical Exam  Nursing note and vitals reviewed. Constitutional: He is oriented to person, place, and time. He appears well-developed and well-nourished. No distress.  HENT:  Head: Normocephalic and atraumatic.  Mouth/Throat: Oropharynx is clear and moist.  Eyes: Conjunctivae and EOM are  normal. Pupils are equal, round, and reactive to light.  Neck: Normal range of motion. Neck supple.  Cardiovascular: Regular rhythm, normal heart sounds and intact distal pulses.        Mildly tachycardic rate  Pulmonary/Chest: Effort normal and breath sounds normal. No respiratory distress. He has no wheezes.  Abdominal: Soft. Bowel sounds are normal. There is no tenderness.  Musculoskeletal: Normal range of motion. He exhibits no tenderness.  Neurological: He is alert and oriented to person, place, and time.  Skin: Skin is warm and dry. No rash noted.    ED Course  Procedures (including critical care time)  Labs Reviewed  CBC - Abnormal; Notable for the following:    WBC 12.3 (*)    HCT 38.5 (*)    Platelets 126 (*)    All other components within normal limits  DIFFERENTIAL - Abnormal; Notable for the following:    Neutrophils Relative 84 (*)    Neutro Abs 10.3 (*)    Lymphocytes Relative 6 (*)    Monocytes Absolute 1.1 (*)    All other components within normal limits  COMPREHENSIVE METABOLIC PANEL - Abnormal; Notable for the following:    Sodium 134 (*)    Glucose, Bld 485 (*)    Alkaline Phosphatase 129 (*)    All other components within normal limits  URINALYSIS, ROUTINE W REFLEX MICROSCOPIC - Abnormal; Notable for the following:    APPearance CLOUDY (*)    Specific Gravity, Urine 1.034 (*)    Glucose, UA >1000 (*)    Hgb urine dipstick LARGE (*)    Ketones, ur TRACE (*)    Protein, ur 100 (*)    Leukocytes, UA MODERATE (*)    All other components within normal limits  URINE MICROSCOPIC-ADD ON - Abnormal; Notable for the following:    Bacteria, UA MANY (*)    All other components within normal limits  GLUCOSE, CAPILLARY - Abnormal; Notable for the following:    Glucose-Capillary 384 (*)    All other components within normal limits  GLUCOSE, CAPILLARY - Abnormal; Notable for the following:    Glucose-Capillary 297 (*)    All other components within normal limits    POCT CBG MONITORING  POCT CBG MONITORING  POCT CBG MONITORING   Dg Chest 2 View  01/17/2011  *RADIOLOGY REPORT*  Clinical Data: Chronic cough.  History diabetes.  CHEST - 2 VIEW 01/17/2011:  Comparison: Portable chest x-ray 11/27/2003 [facility].  Findings: Cardiac silhouette upper normal in size, unchanged. Hilar and mediastinal contours otherwise unremarkable.  Apparent mediastinal widening on the PA image likely due to mediastinal fat, unchanged.  Lungs clear.  Pulmonary vascularity normal.  No pleural effusions.  Degenerative changes involving the thoracic spine.  IMPRESSION: No acute cardiopulmonary disease.  Original Report Authenticated By: Arnell Sieving, M.D.  1. Diabetes mellitus   2. UTI (lower urinary tract infection)       MDM  Hyperglycemia secondary to noncompliance. He also has evidence of a urinary tract infection on laboratory studies. He received a dose of Rocephin as well as 2 L of fluid. He also received insulin. He'll be discharged home with metformin. I instructed him to follow up with his primary care physician who is provided him with samples in the past. I also discharge him home with a prescription for Keflex. His blood sugar has improved to below 300 and his symptoms overall improved. At this time I feel he is safe for discharge to home. He is provided clear signs and symptoms for which to return the emergency department.        Dayton Bailiff, MD 01/17/11 [phone]

## 2011-01-27 ENCOUNTER — Telehealth: Payer: Self-pay | Admitting: Family Medicine

## 2011-01-27 NOTE — Telephone Encounter (Signed)
Wants to know if we have any insulin samples?

## 2011-01-28 NOTE — Telephone Encounter (Signed)
Left message for patient to return call, we do not have and samples of lantus, please ask patient if he needs refills sent to his pharmacy.Skiler Olden, Rodena Medin

## 2011-02-19 ENCOUNTER — Encounter: Payer: Self-pay | Admitting: Family Medicine

## 2011-02-19 ENCOUNTER — Ambulatory Visit (INDEPENDENT_AMBULATORY_CARE_PROVIDER_SITE_OTHER): Payer: Self-pay | Admitting: Family Medicine

## 2011-02-19 VITALS — BP 146/80 | HR 89 | Ht 65.0 in | Wt 185.5 lb

## 2011-02-19 DIAGNOSIS — I10 Essential (primary) hypertension: Secondary | ICD-10-CM

## 2011-02-19 DIAGNOSIS — Z1211 Encounter for screening for malignant neoplasm of colon: Secondary | ICD-10-CM | POA: Insufficient documentation

## 2011-02-19 DIAGNOSIS — Z23 Encounter for immunization: Secondary | ICD-10-CM

## 2011-02-19 DIAGNOSIS — E119 Type 2 diabetes mellitus without complications: Secondary | ICD-10-CM

## 2011-02-19 LAB — POCT GLYCOSYLATED HEMOGLOBIN (HGB A1C): Hemoglobin A1C: 11.6

## 2011-02-19 MED ORDER — LISINOPRIL 40 MG PO TABS: 40.0000 mg | ORAL_TABLET | Freq: Every day | ORAL | Status: DC

## 2011-02-19 MED ORDER — HYDROCHLOROTHIAZIDE 25 MG PO TABS: 25.0000 mg | ORAL_TABLET | Freq: Every day | ORAL | Status: DC

## 2011-02-19 MED ORDER — METFORMIN HCL 850 MG PO TABS: 850.0000 mg | ORAL_TABLET | Freq: Two times a day (BID) | ORAL | Status: DC

## 2011-02-19 MED ORDER — LISINOPRIL-HYDROCHLOROTHIAZIDE 20-12.5 MG PO TABS: 2.0000 | ORAL_TABLET | Freq: Every day | ORAL | Status: DC

## 2011-02-19 MED ORDER — GLIPIZIDE ER 10 MG PO TB24: 10.0000 mg | ORAL_TABLET | Freq: Two times a day (BID) | ORAL | Status: DC

## 2011-02-19 NOTE — Assessment & Plan Note (Addendum)
Above goal on recheck. To simplify his med regimen by combining lisinopril and HCTZ into combo pill. Consider addition of Norvas 5mg  (I see he had this on his med list in the past, discontinued.)  For recheck in 3 months.

## 2011-02-19 NOTE — Assessment & Plan Note (Signed)
Counseled on importance of colon cancer screening.  He is adverse to the idea of colonoscopy in addition to the cost burden.  Agrees to annual stool guaiac cards; given today with instructions.

## 2011-02-19 NOTE — Assessment & Plan Note (Signed)
HgbA1C is high today at 11.6%.  Encouraged to go to [employer] to apply for [employer] to facilitate getting Lantus insulin for control.  Diet and physical activity.

## 2011-02-19 NOTE — Progress Notes (Signed)
  Subjective:    Patient ID: Lawrence [name], male    DOB: [birth year], 51 y.o.   MRN: [id number]  HPI [name] is here for follow up.  He is working now for [employer], does not get benefits with this work.  Went to ED in Jan 18, 2011 for hyperglycemia, polyuria and polydipsia.  Treated with insulin and also given Keflex for a UTI diagnosis.  The polyuria has gone away.  Still has some Lantus samples from this office from several months ago.  Has not been able to get insulin another way.  Did not go to [employer] yet for [employer].      Review of Systems Denies chest pain or pressure, no dyspnea.  No dysuria.  No bowel changes such as diarrhea or constipation. No tingling or numbness in feet (went away from previous visits).  Family Hx; No family history of colorectal cancer.      Objective:   Physical Exam Alert, well appearing in no acute distress. HEENT Neck supple. Mildly dry mucus membranes. No cervical adenopathy COR S1S2, no extra sounds PULM Clear bilaterally, no rales or wheezes EXTs: sensation grossly intact bilaterally. No skin breakdown on feet bilat. No edema in ankles, palpable dp pulses appreciated in both feet.       Assessment & Plan:

## 2011-02-19 NOTE — Patient Instructions (Addendum)
It was a pleasure to see you today.  I encourage you to talk with Rudell Cobb about the orange card for Lantus insulin.  I am changing your blood pressure medicine regimen to save you a copay.  Instead of taking the Lisinopril 40mg  daily and a separate pill of HCTZ 25mg  daily, your new regimen is LISINOPRIL/HCTZ 20/12.5, TAKE 2 PILLS BY MOUTH ONE TIME DAILY.   I would recommend a dilated eye exam for you as soon as possible, also colon cancer screening.  As we discussed, we will try stool cards annually.  FOLLOW UP 3 MONTHS

## 2011-02-23 ENCOUNTER — Encounter (HOSPITAL_COMMUNITY): Payer: Self-pay | Admitting: *Deleted

## 2011-02-23 ENCOUNTER — Emergency Department (HOSPITAL_COMMUNITY)
Admission: EM | Admit: 2011-02-23 | Discharge: 2011-02-23 | Disposition: A | Discharge status: Home / Self Care | Payer: Self-pay | Attending: Emergency Medicine | Admitting: Emergency Medicine

## 2011-02-23 DIAGNOSIS — Z79899 Other long term (current) drug therapy: Secondary | ICD-10-CM | POA: Insufficient documentation

## 2011-02-23 DIAGNOSIS — I10 Essential (primary) hypertension: Secondary | ICD-10-CM | POA: Insufficient documentation

## 2011-02-23 DIAGNOSIS — Z7982 Long term (current) use of aspirin: Secondary | ICD-10-CM | POA: Insufficient documentation

## 2011-02-23 DIAGNOSIS — E119 Type 2 diabetes mellitus without complications: Secondary | ICD-10-CM | POA: Insufficient documentation

## 2011-02-23 DIAGNOSIS — N390 Urinary tract infection, site not specified: Secondary | ICD-10-CM | POA: Insufficient documentation

## 2011-02-23 DIAGNOSIS — Z794 Long term (current) use of insulin: Secondary | ICD-10-CM | POA: Insufficient documentation

## 2011-02-23 DIAGNOSIS — R300 Dysuria: Secondary | ICD-10-CM | POA: Insufficient documentation

## 2011-02-23 LAB — URINALYSIS, ROUTINE W REFLEX MICROSCOPIC
Ketones, ur: 15 mg/dL — AB
Nitrite: POSITIVE — AB
Specific Gravity, Urine: 1.026 (ref 1.005–1.030)
Urobilinogen, UA: 1 mg/dL (ref 0.0–1.0)

## 2011-02-23 LAB — URINE CULTURE: Colony Count: >=100000

## 2011-02-23 MED ORDER — CEPHALEXIN 500 MG PO CAPS: 500.0000 mg | ORAL_CAPSULE | Freq: Four times a day (QID) | ORAL | Status: AC

## 2011-02-23 MED ORDER — PHENAZOPYRIDINE HCL 200 MG PO TABS: 200.0000 mg | ORAL_TABLET | Freq: Three times a day (TID) | ORAL | Status: AC

## 2011-02-23 MED ORDER — CEPHALEXIN 500 MG PO CAPS
500.0000 mg | ORAL_CAPSULE | Freq: Once | ORAL | Status: AC
  Administered 2011-02-23: 500 mg via ORAL
  Filled 2011-02-23: qty 1

## 2011-02-23 NOTE — ED Notes (Signed)
Urine will back in 

## 2011-02-23 NOTE — ED Provider Notes (Signed)
History     CSN: [id number]  Arrival date & time 02/23/11  8295   First MD Initiated Contact with Patient 02/23/11 2004      Chief Complaint  Patient presents with  . Urinary Tract Infection    (Consider location/radiation/quality/duration/timing/severity/associated sxs/prior treatment) Patient is a 51 y.o. male presenting with urinary tract infection. The history is provided by the patient.  Urinary Tract Infection This is a new problem. The current episode started yesterday. The problem occurs constantly. The problem has been gradually worsening. Associated symptoms include urinary symptoms. Pertinent negatives include no chills, fever, nausea or vomiting.  He reports UTI one month ago without other history.   Past Medical History  Diagnosis Date  . Diabetes mellitus   . Hypertension     History reviewed. No pertinent past surgical history.  History reviewed. No pertinent family history.  History  Substance Use Topics  . Smoking status: Never Smoker   . Smokeless tobacco: Never Used  . Alcohol Use: No      Review of Systems  Constitutional: Negative for fever and chills.  HENT: Negative.   Respiratory: Negative.   Cardiovascular: Negative.   Gastrointestinal: Negative.  Negative for nausea and vomiting.  Genitourinary: Positive for dysuria.  Musculoskeletal: Negative.   Skin: Negative.   Neurological: Negative.     Allergies  Review of patient's allergies indicates no known allergies.  Home Medications   Current Outpatient Rx  Name Route Sig Dispense Refill  . ASPIRIN 81 MG PO TABS Oral Take 81 mg by mouth daily.      Marland Kitchen GLUCOMETER ELITE CLASSIC KIT Does not apply by Does not apply route. Check glucose once daily as direced     . GLIPIZIDE ER 10 MG PO TB24 Oral Take 1 tablet (10 mg total) by mouth 2 (two) times daily. 60 tablet 11  . GLUCOSE BLOOD VI STRP Other 1 each by Other route as needed. Check home glucose once daily as directed     . INSULIN  GLARGINE 100 UNIT/ML [place] SOLN Subcutaneous Inject 20 Units into the skin at bedtime. 10 mL 6  . LISINOPRIL-HYDROCHLOROTHIAZIDE 20-12.5 MG PO TABS Oral Take 2 tablets by mouth daily. 60 tablet 11  . METFORMIN HCL 850 MG PO TABS Oral Take 1 tablet (850 mg total) by mouth 2 (two) times daily with a meal. 60 tablet 11  . SIMVASTATIN 20 MG PO TABS Oral Take 1 tablet (20 mg total) by mouth at bedtime. 90 tablet 3    BP 164/90  Pulse 98  Temp(Src) 98.6 F (37 C) (Oral)  Resp 18  SpO2 99%  Physical Exam  Constitutional: He appears well-developed and well-nourished.  HENT:  Head: Normocephalic.  Neck: Normal range of motion. Neck supple.  Pulmonary/Chest: Effort normal.  Abdominal: Soft. Bowel sounds are normal. There is no tenderness. There is no rebound and no guarding.  Genitourinary:       No CVA tenderness.  Musculoskeletal: Normal range of motion.  Neurological: He is alert. No cranial nerve deficit.  Skin: Skin is warm and dry. No rash noted.  Psychiatric: He has a normal mood and affect.    ED Course  Procedures (including critical care time)  Labs Reviewed  URINALYSIS, ROUTINE W REFLEX MICROSCOPIC - Abnormal; Notable for the following:    Color, Urine RED (*) BIOCHEMICALS MAY BE AFFECTED BY COLOR   APPearance TURBID (*)    Glucose, UA 500 (*)    Hgb urine dipstick LARGE (*)  Bilirubin Urine MODERATE (*)    Ketones, ur 15 (*)    Protein, ur >300 (*)    Nitrite POSITIVE (*)    Leukocytes, UA MODERATE (*)    All other components within normal limits  URINE MICROSCOPIC-ADD ON - Abnormal; Notable for the following:    Squamous Epithelial / LPF FEW (*)    Bacteria, UA MANY (*)    All other components within normal limits  URINE CULTURE   No results found.   No diagnosis found.    MDM  2nd UTI in one month without previous history. Cultures pending. Refer to urology.        Rodena Medin, PA-C 02/23/11 2217

## 2011-02-23 NOTE — ED Notes (Signed)
Pt states he started to have back pain he thinks his uti has returned

## 2011-02-23 NOTE — ED Provider Notes (Signed)
Medical screening examination/treatment/procedure(s) were performed by non-physician practitioner and as supervising physician I was immediately available for consultation/collaboration. Devoria Albe, MD, Armando Gang   Ward Givens, MD 02/23/11 2229

## 2011-02-23 NOTE — ED Notes (Signed)
Pt in c/o burning with urination and hematuria x1 day

## 2011-02-24 LAB — HEMOCCULT GUIAC POC 1CARD (OFFICE)

## 2011-02-24 NOTE — Progress Notes (Signed)
Addended by: [place], Shadoe Bethel on: 02/24/2011 04:18 PM   Modules accepted: Orders

## 2011-02-26 ENCOUNTER — Encounter: Payer: Self-pay | Admitting: Family Medicine

## 2011-02-26 ENCOUNTER — Telehealth: Payer: Self-pay | Admitting: Family Medicine

## 2011-02-26 NOTE — Telephone Encounter (Signed)
Please call Lawrence [name] regarding urine culture results he took Tuesday evening at the ED.

## 2011-02-27 NOTE — ED Notes (Signed)
Sensitive to same; Treated with Keflex per protocol MD

## 2011-03-01 NOTE — Telephone Encounter (Signed)
Called and informed pt of Dr.Breen's message. He denies any symptoms and feels great. Lorenda Hatchet, Renato Battles

## 2011-03-01 NOTE — Telephone Encounter (Signed)
Patient's urine culture grew E. Coli, sensitive the the antibiotic that was prescribed to him at the ED.  Please call him to let him know this, and if he continues with urinary symptoms or fevers/chills, he needs to come in to be seen.   Thanks,  Aretta Nip

## 2011-03-08 ENCOUNTER — Ambulatory Visit (INDEPENDENT_AMBULATORY_CARE_PROVIDER_SITE_OTHER): Payer: Self-pay | Admitting: Family Medicine

## 2011-03-08 ENCOUNTER — Telehealth: Payer: Self-pay | Admitting: Family Medicine

## 2011-03-08 ENCOUNTER — Encounter: Payer: Self-pay | Admitting: Family Medicine

## 2011-03-08 VITALS — BP 184/113 | HR 90 | Temp 98.1°F | Ht 65.0 in | Wt 186.0 lb

## 2011-03-08 DIAGNOSIS — R300 Dysuria: Secondary | ICD-10-CM

## 2011-03-08 DIAGNOSIS — I10 Essential (primary) hypertension: Secondary | ICD-10-CM

## 2011-03-08 LAB — POCT URINALYSIS DIPSTICK
Bilirubin, UA: NEGATIVE
Glucose, UA: 500
Spec Grav, UA: 1.015
pH, UA: 5.5

## 2011-03-08 LAB — POCT UA - MICROSCOPIC ONLY

## 2011-03-08 MED ORDER — SULFAMETHOXAZOLE-TRIMETHOPRIM 800-160 MG PO TABS: 1.0000 | ORAL_TABLET | Freq: Two times a day (BID) | ORAL | Status: AC

## 2011-03-08 NOTE — Patient Instructions (Signed)
Prostatitis Prostatitis is an inflammation (the body's way of reacting to injury and/or infection) of the prostate gland. The prostate gland is a male organ. The gland is about the size and shape of a walnut. The prostate is located just below the bladder. It produces semen, which is a fluid that helps nourish and transport sperm. Prostatitis is the most common urinary tract problem in men younger than age 50. There are 4 categories of prostatitis:  I - Acute bacterial prostatitis.   II - Chronic bacterial prostatitis.   III - Chronic prostatitis and chronic pelvic pain syndrome (CPPS).   Inflammatory.   Non inflammatory.   IV - Asymptomatic inflammatory prostatitis.  Acute and chronic bacterial prostatitis are problems with bacterial infections of the prostate. "Acute" infection is usually a one-time problem. "Chronic" bacterial prostatitis is a condition with recurrent infection. It is usually caused by the same germ(bacteria). CPPS has symptoms similar to prostate infection. However, no infection is actually found. This condition can cause problems of ongoing pain. Currently, it cannot be cured. Treatments are available and aimed at symptom control.  Asymptomatic inflammatory prostatitis has no symptoms. It is a condition where infection-fighting cells are found by chance in the urine. The diagnosis is made most often during an exam for other conditions. Other conditions could be infertility or a high level of PSA (prostate-specific antigen) in the blood. SYMPTOMS  Symptoms can vary depending upon the type of prostatitis that exists. There can also be overlap in symptoms. This can make diagnosis difficult. Symptoms: For Acute bacterial prostatitis  Painful urination.   Fever or chills.   Muscle or joint pains.   Low back pain.   Low abdominal pain.   Inability to empty bladder completely.   Sudden urges to urinate.   Frequent urination during the day.   Difficulty starting  urine stream.   Need to urinate several times at night (nocturia).   Weak urine stream.   Urethral (tube that carries urine from the bladder out of the body) discharge and dribbling after urination.  For Chronic bacterial prostatitis  Rectal pain.   Pain in the testicles, penis, or tip of the penis.   Pain in the space between the anus and scrotum (perineum).   Low back pain.   Low abdominal pain.   Problems with sexual function.   Painful ejaculation.   Bloody semen.   Inability to empty bladder completely.   Painful urination.   Sudden urges to urinate.   Frequent urination during the day.   Difficulty starting urine stream.   Need to urinate several times at night (nocturia).   Weak urine stream.   Dribbling after urination.   Urethral discharge.  For Chronic prostatitis and chronic pelvic pain syndrome (CPPS) Symptoms are the same as those for chronic bacterial prostatitis. Problems with sexual function are often the reason for seeking care. This important problem should be discussed with your caregiver. For Asymptomatic inflammatory prostatitis As noted above, there are no symptoms with this condition. DIAGNOSIS   Your caregiver may perform a rectal exam. This exam is to determine if the prostate is swollen and tender.   Sometimes blood work is performed. This is done to see if your white blood cell count is elevated. The Prostate Specific Antigen (PSA) is also measured. PSA is a blood test that can help detect early prostate cancer.   A urinalysis is done to find out what type of infection is present if this is a suspected cause. An   additional urinalysis may be done after a digital rectal exam. This is to see if white blood cells are pushed out of the prostate and into the urine. A low-grade infection of the prostate may not be found on the first urinalysis.  In more difficult cases, your caregiver may advise other tests. Tests could include:  Urodynamics  -- Tests the function of the bladder and the organs involved in triggering and controlling normal urination.   Urine flow rate.   Cystoscopy -- In this procedure, a thin, telescope-like tube with a light and tiny camera attached (cystoscope) is inserted into the bladder through the urethra. This allows the caregiver to see the inside of the urethra and bladder.   Electromyography -- This procedure tests how the muscles and nerves of the bladder work. It is focused on the muscles that control the anus and pelvic floor. These are the muscles between the anus and scrotum.  In people who show no signs of infection, certain uncommon infections might be causing constant or recurrent symptoms. These uncommon infections are difficult to detect. More work in medicine may help find solutions to these problems. TREATMENT  Antibiotics are used to treat infections caused by germs. If the infection is not treated and becomes long lasting (chronic), it may become a lower grade infection with minor, continual problems. Without treatment, the prostate may develop a boil or furuncle (abscess). This may require surgical treatment. For those with chronic prostatitis and CPPS, it is important to work closely with your primary caregiver and urologist. For some, the medicines that are used to treat a non-cancerous, enlarged prostate (benign prostatic hypertrophy) may be helpful. Referrals to specialists other than urologists may be necessary. In rare cases when all treatments have been inadequate for pain control, an operation to remove the prostate may be recommended. This is very rare and before this is considered thorough discussion with your urologist is highly recommended.  In cases of secondary to chronic non-bacterial prostatitis, a good relationship with your urologist or primary caregiver is essential because it is often a recurrent prolonged condition that requires a good understanding of the causes and a commitment  to therapy aimed at controlling your symptoms. HOME CARE INSTRUCTIONS   Hot sitz baths for 20 minutes, 4 times per day, may help relieve pain.   Non-prescription pain killers may be used as your caregiver recommends if you have no allergies to them. Some illnesses or conditions prevent use of non-prescription drugs. If unsure, check with your caregiver. Take all medications as directed. Take the antibiotics for the prescribed length of time, even if you are feeling better.  SEEK MEDICAL CARE IF:   You have any worsening of the symptoms that originally brought you to your caregiver.   You have an oral temperature above 102 F (38.9 C).   You experience any side effects from medications prescribed.  SEEK IMMEDIATE MEDICAL CARE IF:   You have an oral temperature above 102 F (38.9 C), not controlled by medicine.   You have pain not relieved with medications.   You develop nausea, vomiting, lightheadedness, or have a fainting episode.   You are unable to urinate.   You pass bloody urine or clots.  Document Released: 01/23/2000 Document Revised: 10/07/2010 Document Reviewed: 08/13/2008 [employer] Patient Information 2012 [employer]. 

## 2011-03-08 NOTE — Assessment & Plan Note (Signed)
Elevated today. Manual recheck shows BP 180/95. Pt has not taken BP meds today. Pt states that BP usually runs in 130s when he takes meds. No HA, CP, SOB. Stressed importance of med compliance and follow up with Dr. [name] about this issue as pt felt uncomfortable with my exam today. No clinical red flags on exam.

## 2011-03-08 NOTE — Telephone Encounter (Signed)
I called pt and advised pt set up appt to see Dr [name] [id number] prostate issues as he does not wish to see any other MD [id number] issue. Pt states he scheduled an appt for 03/23/2011 and is agreeable to waiting till then to discuss w/Dr [name].

## 2011-03-08 NOTE — Progress Notes (Signed)
  Subjective:    Patient ID: Lawrence [name], male    DOB: [birth year], 51 y.o.   MRN: [id number]  HPI Dysuria x 1 day. Pt states that he has noticed burning with urination since earlier this morning. Pt states that he was treated for a UTI at [facility] approx two weeks ago. Pt states that he was treated with keflex x 10. Pt states that he had burning with urination around this same time frame. Sxs resolved with keflex and seemed to have returned from this morning. No fevers, chills, nausea, vomiting, flank pain. No hematuria. Pt states that he also had a urinary tract infection that he was treated for here at the [facility] in 01/2011. Presentation was similar to recent UTI that he was treated for at [facility]. No recent sexual activity.   Pt does report that he has had an issue of chronic/intermittent urinary frequency and urgency.   Broached issue of needing to do a physical exam on pt and pt stated that " i only feel comfortable with Dr. [name] examining me". Pt also verbally expressed unhappiness with having to wait 4 hours at [facility] for UTI treatment 2 weeks ago..." I waisted 4 good hours of my life at the place"   Review of Systems See HPI, otherwise ROS negative    Objective:   Physical Exam Gen: up in chair, NAD CV: RRR, no murmurs PULM: CTAB, no wheezes  ABD: obese, non tender, + bowel sounds, no suprapubic tenderness,    Assessment & Plan:

## 2011-03-08 NOTE — Assessment & Plan Note (Signed)
Symptomatically 3rd episode of dysuria/UTI since December. Given transient improvement with antibiotics (recent urine cx w/ ecoli pansensitive apart from intermediate sensitivity to macrobid--> UTI txd w/ keflex) as well as BPH type sxs discussed with pt that symptoms are likely of prostatic origin (poor prostatitic abx absorption) and would benefit greatly from a prostate exam. Pt was only comfortable with a general exam. UA and urine culture performed. Will treat for prostatitis with bactrim x 2 weeks. Stressed importance of follow up with Dr. [name] for prostate exam as well as symptomatic follow up. Pt agreeable.

## 2011-03-08 NOTE — Telephone Encounter (Signed)
Pt in this AM and saw Dr Ellin Mayhew for you to call him, more comfortable with you-he says.

## 2011-03-10 LAB — URINE CULTURE: Organism ID, Bacteria: NO GROWTH

## 2011-03-23 ENCOUNTER — Ambulatory Visit (INDEPENDENT_AMBULATORY_CARE_PROVIDER_SITE_OTHER): Payer: Self-pay | Admitting: Family Medicine

## 2011-03-23 ENCOUNTER — Encounter: Payer: Self-pay | Admitting: Family Medicine

## 2011-03-23 DIAGNOSIS — N402 Nodular prostate without lower urinary tract symptoms: Secondary | ICD-10-CM

## 2011-03-23 DIAGNOSIS — R319 Hematuria, unspecified: Secondary | ICD-10-CM | POA: Insufficient documentation

## 2011-03-23 DIAGNOSIS — N4289 Other specified disorders of prostate: Secondary | ICD-10-CM | POA: Insufficient documentation

## 2011-03-23 LAB — POCT URINALYSIS DIPSTICK
Nitrite, UA: NEGATIVE
Protein, UA: 100
Urobilinogen, UA: 0.2
pH, UA: 5.5

## 2011-03-23 LAB — POCT UA - MICROSCOPIC ONLY

## 2011-03-23 LAB — PSA, TOTAL AND FREE: PSA, Free Pct: 21 % — ABNORMAL LOW (ref >25)

## 2011-03-23 NOTE — Patient Instructions (Signed)
It was a pleasure to see you today.  I am concerned about the presence of blood in your urine, especially in light of the negative urine culture.   I am checking your PSA blood test today, along with a repeat urine test.  We may consider imaging of your kidneys and bladder, so it is important to see Lawrence [name] for the [employer].   I would like to see you back in 1 month.

## 2011-03-24 NOTE — Progress Notes (Signed)
  Subjective:    Patient ID: Lawrence [name], male    DOB: [birth year], 51 y.o.   MRN: [id number]  HPI Patient presents for follow up dysuria, treatment for UTI with [facility] and reports that he is feeling better, no urinary symptoms.  He was never febrile with this illness.  Denies history of flank pain or PMHx of renal stones.  Has not seen frank blood in urine.   Has not gone to see [employer] regarding [employer]), "lost the paperwork".  Review of Systems See HPI.     Objective:   Physical Exam Well appearing, no apparent distress HEENT Neck supple,. Mucus membranes moist.  ABD No CVA tenderness, no abdominal tenderness.  DRE: Prostate asymmetry without nodularity; no prostate tenderness.  No rectal masses noted.       Assessment & Plan:

## 2011-03-24 NOTE — Assessment & Plan Note (Signed)
Prostate asymmetry on exam (R>L) without nodularity.  Will check PSA free and total.  Denies symptoms of BPH or outlet obstruction.

## 2011-03-24 NOTE — Assessment & Plan Note (Signed)
Patient with hematuria and urinary symptoms suggestive of UTI on 1/28; however, culture at that time showed no growth.  The UA and micro today with 1-5 RBCs, also with casts on micro.  Patient's urinary symptoms have resolved, and today's exam is without prostate tenderness on DRE.  Casts suggestive of glomerular source of bleeding.  Of note, patient has poorly controlled diabetes which predisposes him to proteinuria (seen on dipstick urine).  Plan for CT urography to evaluate for GU lesions that might explain hematuria.  Patient has not gone for [employer] to be qualified for [employer], which I have told him will greatly help him in getting his medical care, medications through MAP, etc.  Plan to order CT urography once he has spoken to [employer].  Will await results of this before attempting urology for cystoscopy.

## 2011-03-25 ENCOUNTER — Other Ambulatory Visit: Payer: Self-pay | Admitting: Family Medicine

## 2011-03-25 ENCOUNTER — Telehealth: Payer: Self-pay | Admitting: Family Medicine

## 2011-03-25 DIAGNOSIS — N4289 Other specified disorders of prostate: Secondary | ICD-10-CM

## 2011-03-25 NOTE — Telephone Encounter (Signed)
Fwd. To Dr.Breen for info. .Lawrence [name]  

## 2011-03-25 NOTE — Telephone Encounter (Signed)
Pt called back and message was given for CT scan - also made appt for labs- coming tomorrow

## 2011-03-25 NOTE — Telephone Encounter (Signed)
Called patient and discussed lab results, including persistent (albeit improved) hematuria on micro; also with PSA 1.4 which, while normal, does not exclude prostate cancer as possible explanation for prostate asymmetry on exam.  I recommend consultation with urology for further evaluation of both the hematuria and the abnormal prostate exam on DRE.    Patient states that he would like for me to discuss with Dr. Su Grand of [facility], whom I called and who agrees with plan for urologic evaluation.  Dr Brunilda Payor states that he will kindly see Geo as a patient once the CT urography is complete.  I have ordered CT abd/pelvis w/wo contrast, after which Dorsie is to call Dr. Madilyn Hook office (I gave him the phone number on his voice mail) and arrange for appointment.  Finally, I have urged Darin to see Rudell Cobb about qualifying for the [employer] in order to help with the financial implications of the CT scan and other [facility]-related services involved in his care.   [name], M.D.

## 2011-03-25 NOTE — Telephone Encounter (Signed)
Called pt and left message to call back. Pt needs: 1.) blood work today or tomorrow in order to have his scan done. Dr.Breen will put in the orders.   2.) scheduled CT SCAN AT [facility]  MON 03-29-11 ARRIVE AT 9:45 AM AT RADIOLOGY.  NPO X 4 HRS BEFORE SCAN. Lorenda Hatchet, Renato Battles

## 2011-03-26 ENCOUNTER — Other Ambulatory Visit: Payer: Self-pay

## 2011-03-26 ENCOUNTER — Telehealth: Payer: Self-pay | Admitting: *Deleted

## 2011-03-26 LAB — BASIC METABOLIC PANEL
BUN: 37 mg/dL — ABNORMAL HIGH (ref 6–23)
Potassium: 5.4 mEq/L — ABNORMAL HIGH (ref 3.5–5.3)
Sodium: 140 mEq/L (ref 135–145)

## 2011-03-26 NOTE — Telephone Encounter (Signed)
APPT WITH DR. Brunilda Payor 04-29-11 AT 3 PM. APPT FOR CT SCAN ON Monday. Informed pt of both appt's and he states 'I will keep both appt's and I know that Dr.Breen wants me to keep them, so I will' Fwd. To Dr.Breen for info. Lorenda Hatchet, Renato Battles

## 2011-03-26 NOTE — Telephone Encounter (Signed)
Spoke with Elnita Maxwell at [facility]. She will schedule appointment for the pt. I will sent the results of the CT scan, if pt keeps the appt on Monday. Lorenda Hatchet, Renato Battles

## 2011-03-26 NOTE — Progress Notes (Signed)
Bmp done today Lawrence [name] 

## 2011-03-29 ENCOUNTER — Ambulatory Visit (HOSPITAL_COMMUNITY)
Admission: RE | Admit: 2011-03-29 | Discharge: 2011-03-29 | Disposition: A | Discharge status: Home / Self Care | Payer: Self-pay | Source: Ambulatory Visit | Attending: Family Medicine | Admitting: Family Medicine

## 2011-03-29 DIAGNOSIS — N4289 Other specified disorders of prostate: Secondary | ICD-10-CM

## 2011-03-29 DIAGNOSIS — R3129 Other microscopic hematuria: Secondary | ICD-10-CM | POA: Insufficient documentation

## 2011-03-29 DIAGNOSIS — N390 Urinary tract infection, site not specified: Secondary | ICD-10-CM | POA: Insufficient documentation

## 2011-03-29 MED ORDER — IOHEXOL 300 MG/ML  SOLN
80.0000 mL | Freq: Once | INTRAMUSCULAR | Status: AC | PRN
  Administered 2011-03-29: 80 mL via INTRAVENOUS

## 2011-03-31 ENCOUNTER — Telehealth: Payer: Self-pay | Admitting: Family Medicine

## 2011-03-31 NOTE — Telephone Encounter (Signed)
Called Lawrence [name] to discuss results of CT scan abdomen/pelvis.  Notably, bladder wall thickening, R sided prostatic heterogeneity at the apex.  Patient is to follow up with urology, he is aware of this and has made an appointment for March.  He has seen Rudell Cobb and is getting the forms needed to apply for project access. [name], MD

## 2011-04-20 ENCOUNTER — Ambulatory Visit: Payer: Self-pay | Admitting: Family Medicine

## 2011-04-20 ENCOUNTER — Encounter: Payer: Self-pay | Admitting: Family Medicine

## 2011-04-20 ENCOUNTER — Ambulatory Visit (INDEPENDENT_AMBULATORY_CARE_PROVIDER_SITE_OTHER): Payer: Self-pay | Admitting: Family Medicine

## 2011-04-20 VITALS — BP 170/88 | HR 84 | Ht 65.0 in | Wt 187.0 lb

## 2011-04-20 DIAGNOSIS — I10 Essential (primary) hypertension: Secondary | ICD-10-CM

## 2011-04-20 DIAGNOSIS — E119 Type 2 diabetes mellitus without complications: Secondary | ICD-10-CM

## 2011-04-20 LAB — BASIC METABOLIC PANEL
Chloride: 103 mEq/L (ref 96–112)
Glucose, Bld: 196 mg/dL — ABNORMAL HIGH (ref 70–99)
Potassium: 4.4 mEq/L (ref 3.5–5.3)
Sodium: 138 mEq/L (ref 135–145)

## 2011-04-20 MED ORDER — METOPROLOL TARTRATE 25 MG PO TABS: 25.0000 mg | ORAL_TABLET | Freq: Two times a day (BID) | ORAL | Status: DC

## 2011-04-20 NOTE — Patient Instructions (Signed)
It was a pleasure to see you today.  I am glad to hear you will see Dr Brunilda Payor on March 21st.   For your blood pressure, I am adding metoprolol 25mg  by mouth twice daily to your regimen.  Keep taking the Lisinopril/HCTZ 20/12.5 two tablets daily as you have been doing.  I am rechecking your potassium today.   I gave you a Lantus pen; please follow with Rudell Cobb for MAP enrollment.   I am asking you to make an appointment with our PHARMACY CLINIC, for 24 hour blood pressure monitoring.  Also, the Accu-Check glucometer and teaching, since you indicate that the test strips for this machine are the cheapest at [employer].   I would like to see you back in 2 months, at which time we will recheck your Hemoglobin A1C.

## 2011-04-20 NOTE — Assessment & Plan Note (Signed)
BP not controlled.  Lawrence [name] claims white coat hypertension; will add metoprolol to his med regimen, pharmacy clinic to see if appropriate for 24 hour remote monitoring  We have room to go with his meds, trying to be cost conscious.  K elevated last BMet, recheck today. Continue ACEI/HCTZ combo.

## 2011-04-20 NOTE — Assessment & Plan Note (Signed)
Pharmacy clinic for recommendations on cheapest machine/test strips for uninsured patient.  CBG teaching as well. Lantus pen given today; he is instructed to get with Rudell Cobb for MAP program.

## 2011-04-20 NOTE — Progress Notes (Signed)
  Subjective:    Patient ID: Lawrence [name], male    DOB: 1960-04-01, 51 y.o.   MRN: [id number]  HPI Patient seen today for DM and HTN followup.  He reports no dysuria or polyuria, reports good stream and emptying since last visit.  Has appt with Dr Brunilda Payor for urology on March 21, plans to keep appt.  Has seen Rudell Cobb and is trying to get paperwork together.  Running low on Lantus.  Confirmed his dosing.   Does not have glucometer at home.  Says he thinks Accu=chek test strips are the cheapest at [employer] (where he used to work, and where he uses the pharmacy).  Denies chest pain or dyspnea.   Says BP is usually higher here in office. Is open to idea of 24 hour readings with pharmacy clinic.  Had been on norvasc until last April; was discontinued (probably a cost issue; Lamorris does not recall swelling or other adverse effects from the med).     Review of Systems     Objective:   Physical Exam Well appearing, no apparent distress HEENT Neck supple. No cervical adenopathy COR S1S2, no extra sounds PULM Clear bilaterally, no rales.  FEET: No lesions; sensation with monofilament is present in all key areas bilaterally. No edema, palpable dp pulses bilaterally.        Assessment & Plan:

## 2011-04-21 ENCOUNTER — Encounter: Payer: Self-pay | Admitting: Family Medicine

## 2011-07-19 ENCOUNTER — Telehealth: Payer: Self-pay | Admitting: Family Medicine

## 2011-07-19 NOTE — Telephone Encounter (Signed)
Patient dropped off forms to be filled out for [facility].  Please call him to pick up when completed.

## 2011-07-20 NOTE — Telephone Encounter (Signed)
Form reviewed; requires large amount of clinical assessment.  Patient needs dedicated appointment to complete the form.  [name], MD

## 2011-08-03 ENCOUNTER — Encounter: Payer: Self-pay | Admitting: Family Medicine

## 2011-08-03 ENCOUNTER — Ambulatory Visit: Payer: Self-pay | Admitting: Family Medicine

## 2011-08-03 ENCOUNTER — Ambulatory Visit (INDEPENDENT_AMBULATORY_CARE_PROVIDER_SITE_OTHER): Payer: Self-pay | Admitting: Family Medicine

## 2011-08-03 VITALS — BP 134/80 | HR 88 | Ht 65.0 in | Wt 190.0 lb

## 2011-08-03 DIAGNOSIS — E119 Type 2 diabetes mellitus without complications: Secondary | ICD-10-CM

## 2011-08-03 LAB — POCT GLYCOSYLATED HEMOGLOBIN (HGB A1C): Hemoglobin A1C: 12.3

## 2011-08-03 NOTE — Patient Instructions (Addendum)
It was a pleasure to see you today to complete the DMV form.  I have indicated that I do not think your drivers license needs to be held for medical reasons.   The form will be faxed to the [facility] at your request.

## 2011-08-04 NOTE — Progress Notes (Signed)
  Subjective:    Patient ID: Lawrence [name], male    DOB: [birth year], 51 y.o.   MRN: [id number]  HPI Lawrence [name] reports feeling well.  Lawrence [name] has been able to afford his Lantus and is using it regularly as prescribed. His CBGs have been running in the 150-range (fasting, at a friend's house).   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  Lawrence [name] informs me that his eye doctor, Dr. Luciana Axe, has the vision segment of the form and Lawrence [name] has an appointment with Dr. Luciana Axe for completion of this part in the near future.  Thus, I am only commenting on the other medical items on the form.   In completing the form, Lawrence [name] reports that Lawrence [name] has had no issues with hypoglycemia; Lawrence [name] has tried to be as compliant as possible with medical recommendations, bearing in mind the economic factors that limit his adherence. Denies chest pain or dyspnea; Lawrence [name] reports that Lawrence [name] has been diagnosed with DM neuropathy of the feet, had been on gabapentin but could not afford it regularly.  Has not been taking, but finds his neuropathy to be much improved.  Reports no issues sensing gas/break pedals when driving. No falls when walking. Lawrence [name] had a lower extremity ulcer treated at wound center, which healed nicely and has not returned.   Lawrence [name] denies any prior history of known CAD; only "cardiovascular" condition is benign essential hypertension (no dysrhythmias, no pacemaker/defibrillator).   To my knowledge Lawrence [name] does not have any mental health/substance abuse issues, and Lawrence [name] confirms this in interview today.  Only "endocrine" condition is the DM, coupled with neuropathy (detailed as part of the Endocrine page of the form).       Review of SystemsSee HPI     Objective:   Physical Exam Well appearing, no apparent distress HEENT neck supple, no cervical adenopathy.  COR S1S2 regular, no extra sounds PULM Clear  bilaterally.  FEET: palpable dp pulses bilaterally. Sensation grossly intact.  Monofilament exam done today, shows decreased sensation over heels (callouses), but otherwise able to sense monofilament. No lesions or skin breakdown on feet/ankles or lower legs bilaterally. Gait unremarkable without assistance.        Assessment & Plan:

## 2011-08-04 NOTE — Assessment & Plan Note (Addendum)
Patient with DM with peripheral neuropathy; his A1C remains high, however only recently has been able to obtain the Lantus insulin on a regular basis.  I believe his A1C should come down considerably, basedupon his most recent fasting CBGs (checks at friends house, as he cannot afford test strips/meter).   DMV form completed, indicated that no specific medical limitations recommended; annual eye exam that is recommended to all patients with DM. Form returned to patient today (after completion), with copy for scanning to EMR and faxing to [facility] at patient's request.

## 2011-09-01 ENCOUNTER — Ambulatory Visit (INDEPENDENT_AMBULATORY_CARE_PROVIDER_SITE_OTHER): Payer: Self-pay | Admitting: *Deleted

## 2011-09-01 DIAGNOSIS — Z0100 Encounter for examination of eyes and vision without abnormal findings: Secondary | ICD-10-CM

## 2011-09-01 NOTE — Progress Notes (Signed)
Patient comes to office today for an appointment to check vision for [facility] form . He needs this in order to keep his drivers license. Dr. [name] did his physical exam on 06/25 and stated that patient needs annual eye exam  and has Dr. Luciana Axe as his eye specialist. Patient was suppose to have Dr. Luciana Axe do eye exam and fill out the part on the form  regarding this.  Patient states Dr. Luciana Axe will charge him $70.00 and his  insurance will not pay for it. Consulted with Dr. [name] and he advises that we cannot do the vision part of the exam , requires more extensive exam by a specialist. Patient advised. He states he will not be able to have the exam because he does not have the money. Thought he could get exam here less expensively.   Unable to perform eye exam in office. No charge for this visit.

## 2011-12-20 ENCOUNTER — Encounter: Payer: Self-pay | Admitting: Home Health Services

## 2011-12-22 ENCOUNTER — Encounter: Payer: Self-pay | Admitting: Home Health Services

## 2012-02-22 ENCOUNTER — Ambulatory Visit (HOSPITAL_COMMUNITY)
Admission: RE | Admit: 2012-02-22 | Discharge: 2012-02-22 | Disposition: A | Discharge status: Home / Self Care | Payer: Self-pay | Source: Ambulatory Visit | Attending: Family Medicine | Admitting: Family Medicine

## 2012-02-22 ENCOUNTER — Encounter: Payer: Self-pay | Admitting: Family Medicine

## 2012-02-22 ENCOUNTER — Ambulatory Visit (INDEPENDENT_AMBULATORY_CARE_PROVIDER_SITE_OTHER): Payer: Self-pay | Admitting: Family Medicine

## 2012-02-22 VITALS — BP 160/80 | HR 87 | Ht 65.0 in | Wt 193.0 lb

## 2012-02-22 DIAGNOSIS — E1165 Type 2 diabetes mellitus with hyperglycemia: Secondary | ICD-10-CM

## 2012-02-22 DIAGNOSIS — R053 Chronic cough: Secondary | ICD-10-CM | POA: Insufficient documentation

## 2012-02-22 DIAGNOSIS — R05 Cough: Secondary | ICD-10-CM

## 2012-02-22 DIAGNOSIS — I10 Essential (primary) hypertension: Secondary | ICD-10-CM

## 2012-02-22 DIAGNOSIS — R059 Cough, unspecified: Secondary | ICD-10-CM | POA: Insufficient documentation

## 2012-02-22 LAB — COMPREHENSIVE METABOLIC PANEL
ALT: 11 U/L (ref 0–53)
AST: 11 U/L (ref 0–37)
Albumin: 3.5 g/dL (ref 3.5–5.2)
BUN: 34 mg/dL — ABNORMAL HIGH (ref 6–23)
CO2: 23 mEq/L (ref 19–32)
Calcium: 9.4 mg/dL (ref 8.4–10.5)
Chloride: 104 mEq/L (ref 96–112)
Potassium: 4.7 mEq/L (ref 3.5–5.3)

## 2012-02-22 MED ORDER — OMEPRAZOLE 20 MG PO CPDR: 20.0000 mg | DELAYED_RELEASE_CAPSULE | Freq: Every day | ORAL | Status: DC

## 2012-02-22 MED ORDER — INSULIN GLARGINE 100 UNIT/ML ~~LOC~~ SOLN: 20.0000 [IU] | Freq: Every day | SUBCUTANEOUS | Status: DC

## 2012-02-22 MED ORDER — SIMVASTATIN 20 MG PO TABS: 20.0000 mg | ORAL_TABLET | Freq: Every day | ORAL | Status: DC

## 2012-02-22 MED ORDER — METFORMIN HCL 850 MG PO TABS: 850.0000 mg | ORAL_TABLET | Freq: Two times a day (BID) | ORAL | Status: DC

## 2012-02-22 MED ORDER — METOPROLOL TARTRATE 25 MG PO TABS: 25.0000 mg | ORAL_TABLET | Freq: Two times a day (BID) | ORAL | Status: DC

## 2012-02-22 NOTE — Patient Instructions (Addendum)
It was a pleasure to see you today.   I am checking your labs today to check your cholesterol, and chemistry panel.  For your cough, I am checking a chest xray.  I would like to try an acid blocker to see if your cough is triggered by acid reflux, which does not have to be accompanied by a sensation of 'heartburn".  I would like to see you back in 3 months.

## 2012-02-23 ENCOUNTER — Encounter: Payer: Self-pay | Admitting: Family Medicine

## 2012-02-23 ENCOUNTER — Telehealth: Payer: Self-pay | Admitting: Family Medicine

## 2012-02-23 NOTE — Telephone Encounter (Signed)
Called patient and left voice message.  I let him know that his chest xray did not find anything abnormal.  I will send a letter about his labs, which show a markedly high glucose and triglyceride level. [name], MD

## 2012-02-23 NOTE — Assessment & Plan Note (Addendum)
To continue with current treatment. Would consider increasing dose of metoprolol as tolerated, to see if better control.  To check lipid panel and chemistry panel/renal function.

## 2012-02-23 NOTE — Assessment & Plan Note (Signed)
Never-smoker with chronic cough; has had workup for this in the past, including a chest xray that was done in the past and was unremarkable.  At this point I am more suspicious of a cause other than pulmonary; will add PPI to see if response, which would be suggestive of GERD without heartburn symptoms.  Also to consider HEENT causes, although without symptoms of nasal congestion or postnasal drip.  CXR being ordered today as well.

## 2012-02-23 NOTE — Progress Notes (Signed)
  Subjective:    Patient ID: Lawrence [name], male    DOB: September 12, 1960, 52 y.o.   MRN: [id number]  HPI His chief complaint is cough.  Productive of nonbloody sputum, has been present for about 1 yr.  Coughing paroxysms.  Never-smoker.  No heartburn symptoms, no seasonality to cough.  No nasal congestion.  Cough triggers gag reflex; worse in the morning.  No fevers or chills.  No snoring.  Does not fall asleep while a passenger in a car, nor while driving at stoplight.   He did not get flu shot this year.  Works for [employer]; does not have benefits.  Gets insulin from friend who has Lantus.  Has seen Dr. Nesi/urology, and is doing well from this standpoint.    Review of SystemsSee above     Objective:   Physical Exam Alert, well appearing, no apparent distress HEENT Neck supple, no cervical adenopathy COR Regular S1S2 PULM Clear bilaterally, no rales or wheezes.  Breathing easily.  ABD Soft, nontender.  Monofilament foot exam: decreased sensation over calluses at both heels; R middle toe (toe pad and MTP).  Palpable dp pulses bilat, without edema.        Assessment & Plan:

## 2012-02-23 NOTE — Assessment & Plan Note (Signed)
Poorly controlled.  Inconsistent access to lantus.  Will try to arrange samples.  Does not qualify for [facility] program for medication assistance .

## 2012-04-18 ENCOUNTER — Ambulatory Visit (HOSPITAL_COMMUNITY)
Admission: RE | Admit: 2012-04-18 | Discharge: 2012-04-18 | Disposition: A | Discharge status: Home / Self Care | Payer: Self-pay | Source: Ambulatory Visit | Attending: General Surgery | Admitting: General Surgery

## 2012-04-18 ENCOUNTER — Encounter (HOSPITAL_BASED_OUTPATIENT_CLINIC_OR_DEPARTMENT_OTHER): Payer: Self-pay | Attending: General Surgery

## 2012-04-18 ENCOUNTER — Other Ambulatory Visit (HOSPITAL_BASED_OUTPATIENT_CLINIC_OR_DEPARTMENT_OTHER): Payer: Self-pay | Admitting: General Surgery

## 2012-04-18 DIAGNOSIS — G589 Mononeuropathy, unspecified: Secondary | ICD-10-CM | POA: Insufficient documentation

## 2012-04-18 DIAGNOSIS — E1169 Type 2 diabetes mellitus with other specified complication: Secondary | ICD-10-CM | POA: Insufficient documentation

## 2012-04-18 DIAGNOSIS — E785 Hyperlipidemia, unspecified: Secondary | ICD-10-CM | POA: Insufficient documentation

## 2012-04-18 DIAGNOSIS — Z7982 Long term (current) use of aspirin: Secondary | ICD-10-CM | POA: Insufficient documentation

## 2012-04-18 DIAGNOSIS — L97509 Non-pressure chronic ulcer of other part of unspecified foot with unspecified severity: Secondary | ICD-10-CM | POA: Insufficient documentation

## 2012-04-18 DIAGNOSIS — L989 Disorder of the skin and subcutaneous tissue, unspecified: Secondary | ICD-10-CM | POA: Insufficient documentation

## 2012-04-18 DIAGNOSIS — I10 Essential (primary) hypertension: Secondary | ICD-10-CM | POA: Insufficient documentation

## 2012-04-18 DIAGNOSIS — Z79899 Other long term (current) drug therapy: Secondary | ICD-10-CM | POA: Insufficient documentation

## 2012-04-18 DIAGNOSIS — E119 Type 2 diabetes mellitus without complications: Secondary | ICD-10-CM | POA: Insufficient documentation

## 2012-04-18 DIAGNOSIS — Z794 Long term (current) use of insulin: Secondary | ICD-10-CM | POA: Insufficient documentation

## 2012-04-19 NOTE — H&P (Signed)
NAMELILBURN, STRAW                 ACCOUNT NO.:  [id number]  MEDICAL RECORD NO.:  [id number]  LOCATION:  FOOT                         FACILITY:  [facility]  PHYSICIAN:  Joanne Gavel, M.D.        DATE OF BIRTH:  1960-03-02  DATE OF ADMISSION:  04/18/2012 DATE OF DISCHARGE:                             HISTORY & PHYSICAL   CHIEF COMPLAINT:  Open sores, right foot.  HISTORY OF PRESENT ILLNESS:  This 52 year old male is a long-term diabetic, on insulin and various other medications.  He has profound neuropathy and possibly some peripheral arterial disease.  He developed sores on his right great toe several weeks ago.  He has been treating this with antibiotic ointment.  Two years ago, he had a diabetic foot ulcer on the left side, that took 4 months to heal.  He has recently had laboratory work, and is under the care of the [facility].  PAST MEDICAL HISTORY:  Significant for: 1. Hyperlipidemia. 2. Hypertension. 3. Diabetes.  PAST SURGICAL HISTORY:  Cataract surgery.  SOCIAL HISTORY:  Cigarettes none.  Alcohol none.  MEDICATIONS:  Metformin, glipizide, Lantus, and baby aspirin.  ALLERGIES:  None.  REVIEW OF SYSTEMS:  Essentially as above.  PHYSICAL EXAMINATION:  VITAL SIGNS:  Temperature 98.5, pulse 90, respirations 18, blood pressure 136/80. GENERAL APPEARANCE:  Well developed, well nourished. CHEST:  Clear. HEART:  Regular rhythm. ABDOMEN:  Not examined. EXTREMITIES:  Examination of the right foot reveals a very thin hairless lower leg.  Skin temperature is normal.  Peripheral pulses not palpable, although ABIs measured at 1.06.  There is a 1.4 x 3.5 wound on the right great toe and on the plantar surface over the right first metatarsal, there is a 0.7 x 0.4 defect.  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  PLAN:  Start with Santyl and Hydrogel.  We will get vascular studies and we will consider HBO and possible easy casting.  See him in 7  days.     Joanne Gavel, M.D.     RA/MEDQ  D:  04/18/2012  T:  04/19/2012  Job:  [id number]

## 2012-04-20 ENCOUNTER — Telehealth: Payer: Self-pay | Admitting: Family Medicine

## 2012-04-20 NOTE — Telephone Encounter (Addendum)
Spoke with patient and he has no insurance. States he has tried for financial assistance through [name] but did not qualify.  Stated he tried 6 months ago. Advised patient that we can provide sample now but will need to plan for long term. Gave him [name] phone number  to call and set up appointment to readdress.  Also he asks about [name] and advised him of website to sign up but he does not know anything about computures. Advised him to go to [facility] to ask for assistance.    Two samples of Lantus Insulin given  Lot # [id number] exp date 07/2013, [facility] # [id number]. Patient  will pick up this afternoon.    Will forward message to Dr. [name] to ask if there is any other insulin he could be switched to that would be cheaper. Lantus costs more than $100 per vial.

## 2012-04-20 NOTE — Telephone Encounter (Signed)
Message left on voice mail to call back.

## 2012-04-20 NOTE — Telephone Encounter (Signed)
Pt returned call

## 2012-04-20 NOTE — Telephone Encounter (Signed)
Called pt and left message to call us. See Dr.Breen's message. Thanks, .Arlyss Repress

## 2012-04-20 NOTE — Telephone Encounter (Signed)
Needs office visit to convert to NPH/regular insulins; may apply to Medication assistance program with [facility] dept for Lantus from their pharmacy (may apply even if did not qualify for [facility]). [name], MD

## 2012-04-20 NOTE — Telephone Encounter (Signed)
Pt is asking for samples of Lantus - doesn't have any money

## 2012-04-21 NOTE — Telephone Encounter (Signed)
Patient has scheduled appointment for 05/06 with PCP. . . Lantus samples given yesterday should last him until then.

## 2012-04-26 ENCOUNTER — Telehealth: Payer: Self-pay | Admitting: Family Medicine

## 2012-04-26 NOTE — Telephone Encounter (Signed)
Will forward to Dr Breen 

## 2012-04-26 NOTE — Telephone Encounter (Signed)
Is asking to speak to Dr [name] - he has been going to wound center and they are talking about amputating his big toe.  Just wants to talk to Dr [name] about this.

## 2012-04-27 NOTE — Telephone Encounter (Signed)
Called Zamere, he has been seeing [facility] for Right great toe infection.   Has been told he would likely need amputation of the toe and he is frightened by this.  Discussed the importance of getting him on an insulin regimen and lifestyle changes that will support improved glycemic control in order to avoid other DM-related adverse events.  He has applied for MAP program but did not qualify.  Has enough Lantus samples to get to his next follow up visit here, to discuss change in insulin regimen.  May consider PharmD clinic if we are having difficulty getting control (likely will need to revert to 70/30 insulin for cost reasons). [name], MD

## 2012-04-28 ENCOUNTER — Encounter (INDEPENDENT_AMBULATORY_CARE_PROVIDER_SITE_OTHER): Payer: Self-pay | Admitting: *Deleted

## 2012-04-28 DIAGNOSIS — L97909 Non-pressure chronic ulcer of unspecified part of unspecified lower leg with unspecified severity: Secondary | ICD-10-CM

## 2012-05-01 ENCOUNTER — Encounter (HOSPITAL_COMMUNITY): Payer: Self-pay | Admitting: *Deleted

## 2012-05-01 ENCOUNTER — Encounter: Payer: Self-pay | Admitting: *Deleted

## 2012-05-01 ENCOUNTER — Emergency Department (HOSPITAL_COMMUNITY)
Admission: EM | Admit: 2012-05-01 | Discharge: 2012-05-02 | Disposition: A | Discharge status: Home / Self Care | Payer: Self-pay | Attending: Emergency Medicine | Admitting: Emergency Medicine

## 2012-05-01 DIAGNOSIS — E119 Type 2 diabetes mellitus without complications: Secondary | ICD-10-CM | POA: Insufficient documentation

## 2012-05-01 DIAGNOSIS — Z794 Long term (current) use of insulin: Secondary | ICD-10-CM | POA: Insufficient documentation

## 2012-05-01 DIAGNOSIS — I10 Essential (primary) hypertension: Secondary | ICD-10-CM | POA: Insufficient documentation

## 2012-05-01 DIAGNOSIS — Z79899 Other long term (current) drug therapy: Secondary | ICD-10-CM | POA: Insufficient documentation

## 2012-05-01 DIAGNOSIS — Z7982 Long term (current) use of aspirin: Secondary | ICD-10-CM | POA: Insufficient documentation

## 2012-05-01 DIAGNOSIS — Z5189 Encounter for other specified aftercare: Secondary | ICD-10-CM

## 2012-05-01 DIAGNOSIS — Z4800 Encounter for change or removal of nonsurgical wound dressing: Secondary | ICD-10-CM | POA: Insufficient documentation

## 2012-05-01 NOTE — ED Notes (Signed)
Pt got right foot toe wound wet tonight; being treated at wound center; states needs to have it checked and rewrapped if needed so it doesn't get worse

## 2012-05-01 NOTE — ED Provider Notes (Signed)
History    This chart was scribed for non-physician practitioner working with Celene Kras, MD by ED Scribe, Burman Nieves. This patient was seen in room WTR6/WTR6 and the patient's care was started at 10:52 PM.   CSN: [id number]  Arrival date & time 05/01/12  2140   First MD Initiated Contact with Patient 05/01/12 2252      Chief Complaint  Patient presents with  . Wound Check    (Consider location/radiation/quality/duration/timing/severity/associated sxs/prior treatment) HPI Lawrence [name] is a 52 y.o. male who presents to the Emergency Department complaining of moderate constant pain on his right big toe onset a couple weeks ago.  Pt states he had a callus forming a couple weeks ago.  He went to the wound center and they stated it did not look infected. Pt went back to the wound center after first visit due to sx's getting progressively worse.  Staff at wound center stated possible amputation of toe as it looks gangrenous and necrotic. Pt has high level of anxiety for amputation and is convinced it is looking better. Pt came into the ED today because he took a shower and got his right foot wet and was worried that he got the wound wet. He was not sure if his right big toe needed to be rewrapped. Pt has a follow up appointment at the wound center tomorrow. Pt denies fever, chills, cough, nausea, vomiting, diarrhea, SOB, weakness, and any other associated symptoms. Pt's current PCP is Dr. [name].  Past Medical History  Diagnosis Date  . Diabetes mellitus   . Hypertension     History reviewed. No pertinent past surgical history.  No family history on file.  History  Substance Use Topics  . Smoking status: Never Smoker   . Smokeless tobacco: Never Used  . Alcohol Use: No      Review of Systems  Constitutional: Negative for fever, chills and diaphoresis.  HENT: Negative for neck pain and neck stiffness.   Eyes: Negative for visual disturbance.  Respiratory: Negative for apnea, chest  tightness and shortness of breath.   Cardiovascular: Negative for chest pain and palpitations.  Gastrointestinal: Negative for nausea, vomiting, diarrhea and constipation.  Genitourinary: Negative for dysuria.  Musculoskeletal: Negative for gait problem.  Skin: Positive for wound. Negative for rash.  Neurological: Negative for dizziness, weakness, light-headedness, numbness and headaches.    Allergies  Review of patient's allergies indicates no known allergies.  Home Medications   Current Outpatient Rx  Name  Route  Sig  Dispense  Refill  . aspirin 81 MG tablet   Oral   Take 81 mg by mouth daily.           Marland Kitchen glipiZIDE (GLUCOTROL XL) 10 MG 24 hr tablet   Oral   Take 1 tablet (10 mg total) by mouth 2 (two) times daily.   60 tablet   11   . insulin glargine (LANTUS) 100 UNIT/ML injection   Subcutaneous   Inject 20 Units into the skin at bedtime.   10 mL   6   . metFORMIN (GLUCOPHAGE) 850 MG tablet   Oral   Take 1 tablet (850 mg total) by mouth 2 (two) times daily with a meal.   60 tablet   11   . metoprolol tartrate (LOPRESSOR) 25 MG tablet   Oral   Take 1 tablet (25 mg total) by mouth 2 (two) times daily.   180 tablet   3   . omeprazole (PRILOSEC) 20 MG capsule  Oral   Take 1 capsule (20 mg total) by mouth daily.   30 capsule   3     BP 190/83  Pulse 95  Temp(Src) 99 F (37.2 C)  Resp 20  SpO2 100%  Physical Exam  Nursing note and vitals reviewed. Constitutional: He is oriented to person, place, and time. He appears well-developed and well-nourished. No distress.  HENT:  Head: Normocephalic and atraumatic.  Eyes: Conjunctivae and EOM are normal.  Neck: Normal range of motion. Neck supple.  No meningeal signs  Cardiovascular: Normal rate, regular rhythm and normal heart sounds.  Exam reveals no gallop and no friction rub.   No murmur heard. Pulmonary/Chest: Effort normal and breath sounds normal. No respiratory distress. He has no wheezes. He has  no rales. He exhibits no tenderness.  Abdominal: Soft. Bowel sounds are normal. He exhibits no distension. There is no tenderness. There is no rebound and no guarding.  Musculoskeletal: Normal range of motion. He exhibits no edema and no tenderness.  Neurological: He is alert and oriented to person, place, and time. No cranial nerve deficit.  Skin: Skin is warm. He is not diaphoretic.  Right great toe is black, soggy, and necrotic. Remainder of foot is unaffected. Pedal pulse intact. ROM of extremity intact.   Psychiatric:  anxious    ED Course  Procedures (including critical care time) DIAGNOSTIC STUDIES: Oxygen Saturation is 100% on room air, normal by my interpretation.    COORDINATION OF CARE: 11:16 PM Discussed ED treatment with pt and pt agrees.     Labs Reviewed - No data to display No results found.   1. Visit for wound care       MDM  Pt is being followed by Wound Clinic for care of his great right toe for the past 2-3 weeks. His primary care physician has not been involved in its care. Pt has a high level of anxiety over losing this toe and asked it to be dried and re-wrapped after he "just couldn't take it anymore and had to take a shower."  Dr. Dierdre Highman also examined the pt. Discussed with pt the benefits of being admitted for this very progressive wound. Pt was not in agreement at all and wanted to continue his treatment with the Wound Center as he felt it was getting better. States he is going to see them tomorrow and just wanted a dry wrapping for his toe.   Provided sterile dry dressing as requested, discussed return precautions, and stressed importance of keeping that Wound Center appointment.  I personally performed the services described in this documentation, which was scribed in my presence. The recorded information has been reviewed and is accurate.   Glade Nurse, PA-C 05/02/12 1718

## 2012-05-02 ENCOUNTER — Inpatient Hospital Stay (HOSPITAL_COMMUNITY)
Admission: EM | Admit: 2012-05-02 | Discharge: 2012-05-07 | DRG: 256 | Disposition: A | Discharge status: Home / Self Care | Payer: Medicaid Other | Attending: Family Medicine | Admitting: Family Medicine

## 2012-05-02 ENCOUNTER — Encounter (HOSPITAL_COMMUNITY): Payer: Self-pay | Admitting: *Deleted

## 2012-05-02 ENCOUNTER — Inpatient Hospital Stay (HOSPITAL_COMMUNITY): Payer: Medicaid Other

## 2012-05-02 DIAGNOSIS — I798 Other disorders of arteries, arterioles and capillaries in diseases classified elsewhere: Secondary | ICD-10-CM | POA: Diagnosis present

## 2012-05-02 DIAGNOSIS — E785 Hyperlipidemia, unspecified: Secondary | ICD-10-CM | POA: Diagnosis present

## 2012-05-02 DIAGNOSIS — L97509 Non-pressure chronic ulcer of other part of unspecified foot with unspecified severity: Secondary | ICD-10-CM | POA: Diagnosis present

## 2012-05-02 DIAGNOSIS — G629 Polyneuropathy, unspecified: Secondary | ICD-10-CM

## 2012-05-02 DIAGNOSIS — D500 Iron deficiency anemia secondary to blood loss (chronic): Secondary | ICD-10-CM | POA: Diagnosis not present

## 2012-05-02 DIAGNOSIS — I10 Essential (primary) hypertension: Secondary | ICD-10-CM | POA: Diagnosis present

## 2012-05-02 DIAGNOSIS — G589 Mononeuropathy, unspecified: Secondary | ICD-10-CM

## 2012-05-02 DIAGNOSIS — E7800 Pure hypercholesterolemia, unspecified: Secondary | ICD-10-CM | POA: Diagnosis present

## 2012-05-02 DIAGNOSIS — E1159 Type 2 diabetes mellitus with other circulatory complications: Principal | ICD-10-CM | POA: Diagnosis present

## 2012-05-02 DIAGNOSIS — M79609 Pain in unspecified limb: Secondary | ICD-10-CM

## 2012-05-02 DIAGNOSIS — N059 Unspecified nephritic syndrome with unspecified morphologic changes: Secondary | ICD-10-CM | POA: Diagnosis not present

## 2012-05-02 DIAGNOSIS — I96 Gangrene, not elsewhere classified: Secondary | ICD-10-CM | POA: Diagnosis present

## 2012-05-02 DIAGNOSIS — T50995A Adverse effect of other drugs, medicaments and biological substances, initial encounter: Secondary | ICD-10-CM | POA: Diagnosis not present

## 2012-05-02 DIAGNOSIS — Z79899 Other long term (current) drug therapy: Secondary | ICD-10-CM

## 2012-05-02 DIAGNOSIS — E119 Type 2 diabetes mellitus without complications: Secondary | ICD-10-CM | POA: Diagnosis present

## 2012-05-02 DIAGNOSIS — I70269 Atherosclerosis of native arteries of extremities with gangrene, unspecified extremity: Secondary | ICD-10-CM

## 2012-05-02 DIAGNOSIS — Z794 Long term (current) use of insulin: Secondary | ICD-10-CM

## 2012-05-02 DIAGNOSIS — I129 Hypertensive chronic kidney disease with stage 1 through stage 4 chronic kidney disease, or unspecified chronic kidney disease: Secondary | ICD-10-CM | POA: Diagnosis present

## 2012-05-02 DIAGNOSIS — G609 Hereditary and idiopathic neuropathy, unspecified: Secondary | ICD-10-CM | POA: Diagnosis present

## 2012-05-02 DIAGNOSIS — N181 Chronic kidney disease, stage 1: Secondary | ICD-10-CM | POA: Diagnosis present

## 2012-05-02 DIAGNOSIS — D62 Acute posthemorrhagic anemia: Secondary | ICD-10-CM

## 2012-05-02 DIAGNOSIS — Z7982 Long term (current) use of aspirin: Secondary | ICD-10-CM

## 2012-05-02 DIAGNOSIS — N179 Acute kidney failure, unspecified: Secondary | ICD-10-CM | POA: Diagnosis not present

## 2012-05-02 DIAGNOSIS — E1165 Type 2 diabetes mellitus with hyperglycemia: Secondary | ICD-10-CM

## 2012-05-02 HISTORY — DX: Chronic kidney disease, unspecified: N18.9

## 2012-05-02 LAB — BASIC METABOLIC PANEL
CO2: 25 mEq/L (ref 19–32)
Glucose, Bld: 93 mg/dL (ref 70–99)
Potassium: 4 mEq/L (ref 3.5–5.1)
Sodium: 138 mEq/L (ref 135–145)

## 2012-05-02 LAB — CBC WITH DIFFERENTIAL/PLATELET
Eosinophils Absolute: 0.1 10*3/uL (ref 0.0–0.7)
Eosinophils Relative: 1 % (ref 0–5)
HCT: 32 % — ABNORMAL LOW (ref 39.0–52.0)
Hemoglobin: 11.3 g/dL — ABNORMAL LOW (ref 13.0–17.0)
Lymphs Abs: 1.4 10*3/uL (ref 0.7–4.0)
MCH: 27.3 pg (ref 26.0–34.0)
MCV: 77.3 fL — ABNORMAL LOW (ref 78.0–100.0)
Monocytes Relative: 7 % (ref 3–12)
RBC: 4.14 MIL/uL — ABNORMAL LOW (ref 4.22–5.81)

## 2012-05-02 MED ORDER — MORPHINE SULFATE 4 MG/ML IJ SOLN
4.0000 mg | Freq: Once | INTRAMUSCULAR | Status: AC
  Administered 2012-05-02: 4 mg via INTRAVENOUS
  Filled 2012-05-02: qty 1

## 2012-05-02 MED ORDER — HEPARIN SODIUM (PORCINE) 5000 UNIT/ML IJ SOLN
5000.0000 [IU] | Freq: Three times a day (TID) | INTRAMUSCULAR | Status: DC
  Administered 2012-05-02 – 2012-05-03 (×2): 5000 [IU] via SUBCUTANEOUS
  Filled 2012-05-02 (×5): qty 1

## 2012-05-02 MED ORDER — VANCOMYCIN HCL IN DEXTROSE 1-5 GM/200ML-% IV SOLN
1000.0000 mg | Freq: Two times a day (BID) | INTRAVENOUS | Status: DC
  Administered 2012-05-03 – 2012-05-04 (×4): 1000 mg via INTRAVENOUS
  Filled 2012-05-02 (×5): qty 200

## 2012-05-02 MED ORDER — PANTOPRAZOLE SODIUM 40 MG PO TBEC
40.0000 mg | DELAYED_RELEASE_TABLET | Freq: Every day | ORAL | Status: DC
  Administered 2012-05-04 – 2012-05-07 (×4): 40 mg via ORAL
  Filled 2012-05-02 (×3): qty 1

## 2012-05-02 MED ORDER — SODIUM CHLORIDE 0.9 % IV SOLN
INTRAVENOUS | Status: DC
  Administered 2012-05-02: 23:00:00 via INTRAVENOUS

## 2012-05-02 MED ORDER — ACETAMINOPHEN 325 MG PO TABS: 650.0000 mg | ORAL_TABLET | Freq: Four times a day (QID) | ORAL | Status: DC | PRN

## 2012-05-02 MED ORDER — ACETAMINOPHEN 650 MG RE SUPP: 650.0000 mg | Freq: Four times a day (QID) | RECTAL | Status: DC | PRN

## 2012-05-02 MED ORDER — PIPERACILLIN-TAZOBACTAM 3.375 G IVPB
3.3750 g | Freq: Three times a day (TID) | INTRAVENOUS | Status: DC
  Administered 2012-05-03 – 2012-05-05 (×7): 3.375 g via INTRAVENOUS
  Filled 2012-05-02 (×9): qty 50

## 2012-05-02 MED ORDER — METOPROLOL TARTRATE 1 MG/ML IV SOLN
5.0000 mg | INTRAVENOUS | Status: DC | PRN
  Administered 2012-05-02: 5 mg via INTRAVENOUS
  Filled 2012-05-02 (×2): qty 5

## 2012-05-02 MED ORDER — TRAZODONE 25 MG HALF TABLET
25.0000 mg | ORAL_TABLET | Freq: Every evening | ORAL | Status: DC | PRN
  Administered 2012-05-03: 25 mg via ORAL
  Filled 2012-05-02 (×2): qty 1

## 2012-05-02 MED ORDER — ATORVASTATIN CALCIUM 40 MG PO TABS
40.0000 mg | ORAL_TABLET | Freq: Every day | ORAL | Status: DC
  Administered 2012-05-03 – 2012-05-06 (×4): 40 mg via ORAL
  Filled 2012-05-02 (×5): qty 1

## 2012-05-02 MED ORDER — TETANUS-DIPHTH-ACELL PERTUSSIS 5-2.5-18.5 LF-MCG/0.5 IM SUSP
0.5000 mL | Freq: Once | INTRAMUSCULAR | Status: AC
  Administered 2012-05-03: 0.5 mL via INTRAMUSCULAR
  Filled 2012-05-02: qty 0.5

## 2012-05-02 MED ORDER — METOPROLOL TARTRATE 25 MG PO TABS
25.0000 mg | ORAL_TABLET | Freq: Two times a day (BID) | ORAL | Status: DC
  Administered 2012-05-02 – 2012-05-07 (×10): 25 mg via ORAL
  Filled 2012-05-02 (×11): qty 1

## 2012-05-02 MED ORDER — ASPIRIN 81 MG PO CHEW
81.0000 mg | CHEWABLE_TABLET | Freq: Every day | ORAL | Status: DC
  Administered 2012-05-04 – 2012-05-07 (×4): 81 mg via ORAL
  Filled 2012-05-02 (×5): qty 1

## 2012-05-02 MED ORDER — INSULIN ASPART 100 UNIT/ML ~~LOC~~ SOLN
0.0000 [IU] | Freq: Three times a day (TID) | SUBCUTANEOUS | Status: DC
  Administered 2012-05-03: 7 [IU] via SUBCUTANEOUS

## 2012-05-02 MED ORDER — HYDROCODONE-ACETAMINOPHEN 5-325 MG PO TABS
1.0000 | ORAL_TABLET | ORAL | Status: DC | PRN
  Administered 2012-05-04 – 2012-05-06 (×5): 2 via ORAL
  Filled 2012-05-02: qty 2
  Filled 2012-05-02: qty 1
  Filled 2012-05-02 (×4): qty 2

## 2012-05-02 MED ORDER — PIPERACILLIN-TAZOBACTAM 3.375 G IVPB 30 MIN
3.3750 g | Freq: Once | INTRAVENOUS | Status: AC
  Administered 2012-05-02: 3.375 g via INTRAVENOUS
  Filled 2012-05-02: qty 50

## 2012-05-02 MED ORDER — ASPIRIN 81 MG PO TABS: 81.0000 mg | ORAL_TABLET | Freq: Every day | ORAL | Status: DC

## 2012-05-02 MED ORDER — INSULIN ASPART 100 UNIT/ML ~~LOC~~ SOLN: 0.0000 [IU] | Freq: Every day | SUBCUTANEOUS | Status: DC

## 2012-05-02 NOTE — Progress Notes (Signed)
Patient ID: Lawrence [name], male   DOB: 1960-04-20, 52 y.o.   MRN: [id number] Dr. Wiliam Ke called me from [facility] wound clinic and stated patient likely needs toe amputation, black , gangrene, etc. I stated I would be happy to consult on patient and provide surgical care. Patient in ER now and I will see him shortly. I have never seen this patient before. Not a direct admit to my service.

## 2012-05-02 NOTE — ED Notes (Signed)
Spoke with Dr. Ophelia Charter, Dr. Ophelia Charter has never seen patient prior to ED arrival . Patient was referred from the wound clinic and was told to come to ER to be admitted with hopes of seeing Dr. Ophelia Charter.  Dr. Ophelia Charter will see patient tonight and will let hospitalist assume care of the patient to get patient admitted.

## 2012-05-02 NOTE — Consult Note (Signed)
Reason for Consult:right great toe gangrene Referring Physician:  Wickline MD,    Roy Arkin MD wound clinic  Lawrence [name] is an 51 y.o. male.  HPI: IDDM since age 19 with 3 wks treatment of right great toe ulcer which has progressed to gangrene over last 5 days  Past Medical History  Diagnosis Date  . Diabetes mellitus   . Hypertension     History reviewed. No pertinent past surgical history.  No family history on file.  Social History:  reports that he has never smoked. He has never used smokeless tobacco. He reports that he does not drink alcohol or use illicit drugs.  Allergies: No Known Allergies  Medications: I have reviewed the patient's current medications.  Results for orders placed during the hospital encounter of 05/02/12 (from the past 48 hour(s))  CBC WITH DIFFERENTIAL     Status: Abnormal   Collection Time    05/02/12  7:14 PM      Result Value Range   WBC 10.4  4.0 - 10.5 K/uL   RBC 4.14 (*) 4.22 - 5.81 MIL/uL   Hemoglobin 11.3 (*) 13.0 - 17.0 g/dL   HCT 32.0 (*) 39.0 - 52.0 %   MCV 77.3 (*) 78.0 - 100.0 fL   MCH 27.3  26.0 - 34.0 pg   MCHC 35.3  30.0 - 36.0 g/dL   RDW 13.2  11.5 - 15.5 %   Platelets 267  150 - 400 K/uL   Neutrophils Relative 79 (*) 43 - 77 %   Neutro Abs 8.2 (*) 1.7 - 7.7 K/uL   Lymphocytes Relative 13  12 - 46 %   Lymphs Abs 1.4  0.7 - 4.0 K/uL   Monocytes Relative 7  3 - 12 %   Monocytes Absolute 0.8  0.1 - 1.0 K/uL   Eosinophils Relative 1  0 - 5 %   Eosinophils Absolute 0.1  0.0 - 0.7 K/uL   Basophils Relative 0  0 - 1 %   Basophils Absolute 0.0  0.0 - 0.1 K/uL  GLUCOSE, CAPILLARY     Status: None   Collection Time    05/02/12  7:49 PM      Result Value Range   Glucose-Capillary 81  70 - 99 mg/dL    No results found.  Review of Systems  Constitutional: Positive for weight loss and malaise/fatigue. Negative for fever and chills.  Eyes: Negative for blurred vision, double vision and photophobia.  Respiratory: Negative.    Cardiovascular: Negative.   Gastrointestinal: Negative.        IDDM on insulin for more than 30 yrs  Genitourinary: Negative.   Skin:       Gangrene right great toe.   Neurological: Negative for headaches.       Peripheral neuropathy bilat LE  Endo/Heme/Allergies:       IDDM   Blood pressure 180/74, pulse 75, temperature 98.1 F (36.7 C), temperature source Oral, resp. rate 18, SpO2 100.00%. Physical Exam  HENT:  Head: Normocephalic.  Right eye upper eyelid ecymosis secondary to fall 5 PM   Eyes:  Eye vision good.   Neck: Normal range of motion.  Cardiovascular: Normal rate.   Respiratory: Effort normal.  GI: Soft.  Musculoskeletal:  Right dry gangrene to base of toe necrotic with odor. Extends to MCP joint  Psychiatric: He has a normal mood and affect. His behavior is normal. Thought content normal.    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   Will need arterial doppler test to check LE arterial supply.     Vascular consult recommended.    Will post for surgery tomorrow evening for right 1st ray amputation.  If arterial dopplers suggest that revasc /stenting could help arterial foot blood supply then would cancel surgery and not do tomorrow evening about 6 or 7 PM.  Discussed with pt. He understands and agrees to proceed.    YATES,Vladimir C 05/02/2012, 8:06 PM      

## 2012-05-02 NOTE — ED Provider Notes (Signed)
History     CSN: [id number]  Arrival date & time 05/02/12  1818   First MD Initiated Contact with Patient 05/02/12 1900      Chief Complaint  Patient presents with  . Foot Pain    Pt sent here to be admitted    ) Patient is a 52 y.o. male presenting with lower extremity pain. The history is provided by the patient.  Foot Pain This is a recurrent problem. The current episode started yesterday. The problem occurs constantly. The problem has been gradually worsening. Pertinent negatives include no chest pain, no abdominal pain and no shortness of breath. Exacerbated by: palpation. The symptoms are relieved by rest. He has tried rest for the symptoms. The treatment provided mild relief.    Past Medical History  Diagnosis Date  . Diabetes mellitus   . Hypertension     History reviewed. No pertinent past surgical history.  No family history on file.  History  Substance Use Topics  . Smoking status: Never Smoker   . Smokeless tobacco: Never Used  . Alcohol Use: No      Review of Systems  Constitutional: Negative for fever.  Respiratory: Negative for shortness of breath.   Cardiovascular: Negative for chest pain.  Gastrointestinal: Negative for abdominal pain.  Neurological: Negative for weakness.  All other systems reviewed and are negative.    Allergies  Review of patient's allergies indicates no known allergies.  Home Medications   Current Outpatient Rx  Name  Route  Sig  Dispense  Refill  . aspirin 81 MG tablet   Oral   Take 81 mg by mouth daily.           Marland Kitchen glipiZIDE (GLUCOTROL XL) 10 MG 24 hr tablet   Oral   Take 1 tablet (10 mg total) by mouth 2 (two) times daily.   60 tablet   11   . insulin glargine (LANTUS) 100 UNIT/ML injection   Subcutaneous   Inject 20 Units into the skin at bedtime.   10 mL   6   . metFORMIN (GLUCOPHAGE) 850 MG tablet   Oral   Take 1 tablet (850 mg total) by mouth 2 (two) times daily with a meal.   60 tablet   11    . metoprolol tartrate (LOPRESSOR) 25 MG tablet   Oral   Take 1 tablet (25 mg total) by mouth 2 (two) times daily.   180 tablet   3   . omeprazole (PRILOSEC) 20 MG capsule   Oral   Take 1 capsule (20 mg total) by mouth daily.   30 capsule   3     BP 198/77  Pulse 77  Temp(Src) 98.1 F (36.7 C) (Oral)  Resp 18  SpO2 100%  Physical Exam CONSTITUTIONAL: Well developed/well nourished HEAD: Normocephalic/atraumatic EYES: EOMI ENMT: Mucous membranes moist NECK: supple no meningeal signs CV: S1/S2 noted, no murmurs/rubs/gallops noted LUNGS: Lungs are clear to auscultation bilaterally, no apparent distress ABDOMEN: soft, nontender, no rebound or guarding NEURO: Pt is awake/alert, moves all extremitiesx4 EXTREMITIES: right great toe is necrotic, boggy in appearance and is foul smelling.  The rest of the foot is warm to touch SKIN: warm, color normal PSYCH: no abnormalities of mood noted  ED Course  Procedures   Labs Reviewed  BASIC METABOLIC PANEL  CBC WITH DIFFERENTIAL   7:35 PM Pt reports is here to admitted for right great toe amputation He reports dr Ophelia Charter is to amputate toe Will call orthopedics and  his PCP (family practice) 8:08 PM  Dr Ophelia Charter saw patient in the ED D/w family medicine, will admit  MDM  Nursing notes including past medical history and social history reviewed and considered in documentation Previous records reviewed and considered Labs/vital reviewed and considered         Joya Gaskins, MD 05/02/12 2008

## 2012-05-02 NOTE — ED Notes (Signed)
Pt seen at wound clinic by Dr Ophelia Charter and told to come to ED to be admitted by hospitalist for possible amputation of R foot tomorrow.  Pt with understanding that he is direct admit.  Unable to find.  Multiple calls made an unable to find direct admit info for pt.

## 2012-05-02 NOTE — Progress Notes (Signed)
ANTIBIOTIC CONSULT NOTE - INITIAL  Pharmacy Consult for vancomycin + zosyn Indication: gangrene of the toe  No Known Allergies  Patient Measurements: Height: 5' 4.96" (165 cm) Weight: 192 lb 14.4 oz (87.5 kg) IBW/kg (Calculated) : 61.41 Adjusted Body Weight:   Vital Signs: Temp: 98.1 F (36.7 C) (03/25 1822) Temp src: Oral (03/25 1822) BP: 180/74 mmHg (03/25 1945) Pulse Rate: 75 (03/25 1945) Intake/Output from previous day:   Intake/Output from this shift:    Labs:  Recent Labs  05/02/12 1914  WBC 10.4  HGB 11.3*  PLT 267  CREATININE 1.23   Estimated Creatinine Clearance: 72.2 ml/min (by C-G formula based on Cr of 1.23). No results found for this basename: VANCOTROUGH, VANCOPEAK, VANCORANDOM, GENTTROUGH, GENTPEAK, GENTRANDOM, TOBRATROUGH, TOBRAPEAK, TOBRARND, AMIKACINPEAK, AMIKACINTROU, AMIKACIN,  in the last 72 hours   Microbiology: No results found for this or any previous visit (from the past 720 hour(s)).  Medical History: Past Medical History  Diagnosis Date  . Diabetes mellitus   . Hypertension     Medications:  Anti-infectives   Start     Dose/Rate Route Frequency Ordered Stop   05/03/12 0530  piperacillin-tazobactam (ZOSYN) IVPB 3.375 g     3.375 g 12.5 mL/hr over 240 Minutes Intravenous Every 8 hours 05/02/12 2111     05/02/12 2130  piperacillin-tazobactam (ZOSYN) IVPB 3.375 g     3.375 g 100 mL/hr over 30 Minutes Intravenous  Once 05/02/12 2111     05/02/12 2130  vancomycin (VANCOCIN) IVPB 1000 mg/200 mL premix     1,000 mg 200 mL/hr over 60 Minutes Intravenous Every 12 hours 05/02/12 2111       Assessment: Lawrence [name] presented to the ED with foot pain to start empiric vancomycin + zosyn. Planning on toe amputation tomorrow. Pt is currently afebrile and WBC is WNL. Adequate renal fxn.   Goal of Therapy:  Vancomycin trough level 10-15 mcg/ml  Plan:  1. Vancomycin 1gm IV Q12H 2. Zosyn 3.375gm IV Q8H 3. F/u renal fxn, C&S, clinical status and  trough at [facility], Drake Leach 05/02/2012,9:12 PM

## 2012-05-02 NOTE — H&P (Signed)
Family Medicine Teaching [facility] Admission History and Physical Service Pager: [phone]  Patient name: Lawrence [name] Medical record number: [id number] Date of birth: 1961-02-06 Age: 52 y.o. Gender: male  Primary Care Provider: [name], MD  Chief Complaint: Toe pain/infection  Assessment and Plan: Lawrence [name] is a 52 y.o. year old male presenting with dry gangrene of right great toe.  # Gangrene of Right great toe - Will admit to inpatient, Attending Dr. Jennette Kettle - Orthopedics consulted in the ED.  Will plan for amputation tomorrow at 6 pm pending evaluation by Vascular surgery given significant calcifications seen on foot xray.  LE Arterial Duplex already performed on 3/21.  - Empiric antibiotics - Vanc/Zosyn per pharmacy. - Pain control - Vicodin 5-325 mg, 1-2 tablets Q4 PRN - Chest xray, EKG, and TDAP in preparation for surgery - CBC and BMP in the am.   # DM-2, uncontrolled - SSI, resistant - CBG checks TID AC and QHS - Diabetes education during admission - Will obtain A1C  # HTN  - Will continue Lopressor 25 mg BID, Lopressor 5 mg IV Q2PRN for SBP 160 - Will add addition agents as needed.  # HLD - Starting Lipitor 40 mg.  # CKD, stage 1 - Will monitor BUN/Creatinine closely during admission.  FEN/GI: Carb modified diet.  KVO fluids. Will make NPO after breakfast. Prophylaxis: Heparin SQ, PPI Disposition: Pending clinical improvement Code Status: Full code  History of Present Illness:  Lawrence [name] is a 52 y.o. year old male with uncontrolled Diabetes mellitus type 2, HTN, HLD, and CKD stage 1 who presents to the ED with right great toe pain.  Patient was told to go to the ED by wound care physician.  Patient states that approximately 3-4 weeks ago he developed a callus on his right great toe.  He continued doing his regular activities and the callus subsequently ruptured. He then presented to wound care for evaluation and was placed on Santyl by Dr.  Wiliam Ke.  His wound continued to worsen despite aggressive therapy.  His toe then turned black and he presented to wound care today for followup/evaluation.  He was then directed to go to the ED for likely amputation.  In the ED, he had no leukocytosis or signs of systemic infection.  Physical exam revealed a necrotic right great toe.  Orthopedics was consulted in the ED.   ROS: Denies fevers, chill, nausea, vomiting, SOB, chest pain. Good functional status, no limitation due to breathing.  No orthopnea, no PND, no peripheral edema.  Fall X 1 due to wearing post-operative boot.     Patient Active Problem List  Diagnosis  . DM (diabetes mellitus), type 2, uncontrolled  . HYPERCHOLESTEROLEMIA  . ESSENTIAL HYPERTENSION, BENIGN  . Chronic kidney disease (CKD), stage I  . CRAMP OF LIMB  . Neuropathy  . Colon cancer screening  . Hematuria  . Prostate asymmetry  . Chronic cough   Past Medical History: Past Medical History  Diagnosis Date  . Diabetes mellitus   . Hypertension    Past Surgical History: History reviewed. No pertinent past surgical history.  Social History: History  Substance Use Topics  . Smoking status: Never Smoker   . Smokeless tobacco: Never Used  . Alcohol Use: No   Family History: No family history on file.  Allergies: No Known Allergies  No current facility-administered medications on file prior to encounter.   Current Outpatient Prescriptions on File Prior to Encounter  Medication Sig Dispense Refill  .  aspirin 81 MG tablet Take 81 mg by mouth daily.        Marland Kitchen glipiZIDE (GLUCOTROL XL) 10 MG 24 hr tablet Take 1 tablet (10 mg total) by mouth 2 (two) times daily.  60 tablet  11  . insulin glargine (LANTUS) 100 UNIT/ML injection Inject 20 Units into the skin at bedtime.  10 mL  6  . metFORMIN (GLUCOPHAGE) 850 MG tablet Take 1 tablet (850 mg total) by mouth 2 (two) times daily with a meal.  60 tablet  11  . metoprolol tartrate (LOPRESSOR) 25 MG tablet Take 1  tablet (25 mg total) by mouth 2 (two) times daily.  180 tablet  3  . omeprazole (PRILOSEC) 20 MG capsule Take 1 capsule (20 mg total) by mouth daily.  30 capsule  3   Review Of Systems: Per HPI.  Otherwise 12 point review of systems was performed and was unremarkable.  Physical Exam: BP 180/74  Pulse 75  Temp(Src) 98.1 F (36.7 C) (Oral)  Resp 18  SpO2 100% Exam: General: well appearing, NAD. HEENT: NCAT. Cardiovascular: RRR. No murmurs, rubs, or gallops. Respiratory: CTAB. No rales, rhonchi, or wheeze. Abdomen: soft, nontender, nondistended. Extremities: 1+ dorsalis pedis and posterior tibial pulses bilaterally.  Right great toe black, necrotic and foul smelling.  Extends to MCP. Ulcer noted at base of great toe with purulent drainage.  Skin: No rash.  Neuro: No focal deficits.  Labs and Imaging: CBC BMET   Recent Labs Lab 05/02/12 1914  WBC 10.4  HGB 11.3*  HCT 32.0*  PLT 267   No results found for this basename: NA, K, CL, CO2, BUN, CREATININE, GLUCOSE, CALCIUM,  in the last 168 hours   Dg Foot Complete Right 04/18/2012  IMPRESSION: Soft tissue defect with overlying bandage the first toe.  No underlying osseous destruction.  If persistent clinical concern of osteomyelitis, pre and post contrast dedicated MRI should be considered.      Adriana Simas Grandy, DO 05/02/2012, 8:06 PM   R2 Addendum:  I was present for entire interview and exam and agree with the above findings.  Briefly pt is a poorly controlled diabetic who has developed gangrene of his R 1st toe.  FMTS asked to admit for medical management and surgery planned for tomorrow.  I have personally spoken to both Dr. Ophelia Charter (Ortho) and Dr. Arbie Cookey (Vascular) regarding surgical plans.  Dr. Ophelia Charter plans on 1st toe amputation once cleared by Vascular surgery; Dr. Arbie Cookey to see in AM an will likely need Aortogram with runoff due to elevated monophasic ABIs.    Acutely we will optimize his pre-operative status, collect basic lab  work, obtain EKG and Chest X-ray and start him on Emperic Antibiotics.  Pt is overdue for Tetanus Toxoid so will be given on admission.    Andrena Mews, DO [facility] Resident - PGY-2 05/03/2012 6:59 AM

## 2012-05-03 ENCOUNTER — Inpatient Hospital Stay (HOSPITAL_COMMUNITY): Payer: Medicaid Other | Admitting: Certified Registered Nurse Anesthetist

## 2012-05-03 ENCOUNTER — Encounter (HOSPITAL_COMMUNITY): Payer: Self-pay | Admitting: Certified Registered Nurse Anesthetist

## 2012-05-03 ENCOUNTER — Encounter (HOSPITAL_COMMUNITY)
Admission: EM | Disposition: A | Discharge status: Home / Self Care | Payer: Self-pay | Source: Home / Self Care | Attending: Family Medicine

## 2012-05-03 ENCOUNTER — Encounter (HOSPITAL_COMMUNITY): Payer: Self-pay | Admitting: Anesthesiology

## 2012-05-03 HISTORY — PX: LOWER EXTREMITY ANGIOGRAM: SHX5508

## 2012-05-03 HISTORY — PX: AMPUTATION: SHX166

## 2012-05-03 HISTORY — PX: ABDOMINAL AORTAGRAM: SHX5454

## 2012-05-03 LAB — CBC
HCT: 29.4 % — ABNORMAL LOW (ref 39.0–52.0)
MCH: 26.8 pg (ref 26.0–34.0)
MCHC: 33.2 g/dL (ref 30.0–36.0)
MCV: 79.7 fL (ref 78.0–100.0)
MCV: 80.3 fL (ref 78.0–100.0)
Platelets: 237 10*3/uL (ref 150–400)
Platelets: 219 10*3/uL (ref 150–400)
RDW: 13.2 % (ref 11.5–15.5)
RDW: 13.2 % (ref 11.5–15.5)
WBC: 8.3 10*3/uL (ref 4.0–10.5)

## 2012-05-03 LAB — BASIC METABOLIC PANEL WITH GFR
BUN: 35 mg/dL — ABNORMAL HIGH (ref 6–23)
CO2: 25 meq/L (ref 19–32)
Calcium: 9.2 mg/dL (ref 8.4–10.5)
Chloride: 104 meq/L (ref 96–112)
Creatinine, Ser: 1.45 mg/dL — ABNORMAL HIGH (ref 0.50–1.35)
GFR calc Af Amer: 63 mL/min — ABNORMAL LOW
GFR calc non Af Amer: 54 mL/min — ABNORMAL LOW
Glucose, Bld: 196 mg/dL — ABNORMAL HIGH (ref 70–99)
Potassium: 4.2 meq/L (ref 3.5–5.1)
Sodium: 140 meq/L (ref 135–145)

## 2012-05-03 LAB — GLUCOSE, CAPILLARY
Glucose-Capillary: 202 mg/dL — ABNORMAL HIGH (ref 70–99)
Glucose-Capillary: 141 mg/dL — ABNORMAL HIGH (ref 70–99)
Glucose-Capillary: 117 mg/dL — ABNORMAL HIGH (ref 70–99)
Glucose-Capillary: 108 mg/dL — ABNORMAL HIGH (ref 70–99)
Glucose-Capillary: 103 mg/dL — ABNORMAL HIGH (ref 70–99)

## 2012-05-03 LAB — SURGICAL PCR SCREEN
MRSA, PCR: NEGATIVE
Staphylococcus aureus: POSITIVE — AB

## 2012-05-03 LAB — CREATININE, SERUM: GFR calc Af Amer: 55 mL/min — ABNORMAL LOW (ref >90)

## 2012-05-03 SURGERY — ABDOMINAL AORTAGRAM
Anesthesia: LOCAL | Laterality: Right

## 2012-05-03 SURGERY — AMPUTATION, FOOT, RAY
Anesthesia: Monitor Anesthesia Care | Site: Toe | Laterality: Right | Wound class: Dirty or Infected

## 2012-05-03 MED ORDER — MAGNESIUM SULFATE 40 MG/ML IJ SOLN: 2.0000 g | Freq: Once | INTRAMUSCULAR | Status: AC | PRN

## 2012-05-03 MED ORDER — ENOXAPARIN SODIUM 30 MG/0.3ML ~~LOC~~ SOLN
30.0000 mg | SUBCUTANEOUS | Status: DC
  Administered 2012-05-04 – 2012-05-07 (×4): 30 mg via SUBCUTANEOUS
  Filled 2012-05-03 (×4): qty 0.3

## 2012-05-03 MED ORDER — DEXTROSE 5 % IV SOLN: 1.5000 g | Freq: Two times a day (BID) | INTRAVENOUS | Status: DC

## 2012-05-03 MED ORDER — HEPARIN (PORCINE) IN NACL 2-0.9 UNIT/ML-% IJ SOLN
INTRAMUSCULAR | Status: AC
  Filled 2012-05-03: qty 1000

## 2012-05-03 MED ORDER — LABETALOL HCL 5 MG/ML IV SOLN
INTRAVENOUS | Status: AC
  Filled 2012-05-03: qty 4

## 2012-05-03 MED ORDER — SODIUM CHLORIDE 0.45 % IV SOLN
INTRAVENOUS | Status: DC
  Administered 2012-05-03: 09:00:00 via INTRAVENOUS

## 2012-05-03 MED ORDER — ONDANSETRON HCL 4 MG PO TABS
4.0000 mg | ORAL_TABLET | Freq: Four times a day (QID) | ORAL | Status: DC | PRN
  Administered 2012-05-06: 4 mg via ORAL
  Filled 2012-05-03: qty 1

## 2012-05-03 MED ORDER — SODIUM CHLORIDE 0.9 % IV SOLN
INTRAVENOUS | Status: DC
  Administered 2012-05-03: 09:00:00 via INTRAVENOUS

## 2012-05-03 MED ORDER — MIDAZOLAM HCL 5 MG/5ML IJ SOLN
INTRAMUSCULAR | Status: DC | PRN
  Administered 2012-05-03 (×2): 1 mg via INTRAVENOUS

## 2012-05-03 MED ORDER — ONDANSETRON HCL 4 MG/2ML IJ SOLN
4.0000 mg | Freq: Four times a day (QID) | INTRAMUSCULAR | Status: DC | PRN
  Administered 2012-05-05 (×2): 4 mg via INTRAVENOUS
  Filled 2012-05-03 (×2): qty 2

## 2012-05-03 MED ORDER — SODIUM CHLORIDE 0.45 % IV SOLN: INTRAVENOUS | Status: DC

## 2012-05-03 MED ORDER — HEPARIN SODIUM (PORCINE) 5000 UNIT/ML IJ SOLN: 5000.0000 [IU] | Freq: Three times a day (TID) | INTRAMUSCULAR | Status: DC

## 2012-05-03 MED ORDER — PHENOL 1.4 % MT LIQD: 1.0000 | OROMUCOSAL | Status: DC | PRN

## 2012-05-03 MED ORDER — ONDANSETRON HCL 4 MG/2ML IJ SOLN: 4.0000 mg | Freq: Four times a day (QID) | INTRAMUSCULAR | Status: DC | PRN

## 2012-05-03 MED ORDER — LACTATED RINGERS IV SOLN
INTRAVENOUS | Status: DC
  Administered 2012-05-03: 17:00:00 via INTRAVENOUS

## 2012-05-03 MED ORDER — LACTATED RINGERS IV SOLN
INTRAVENOUS | Status: DC | PRN
  Administered 2012-05-03: 19:00:00 via INTRAVENOUS

## 2012-05-03 MED ORDER — ACETAMINOPHEN 325 MG PO TABS: 325.0000 mg | ORAL_TABLET | ORAL | Status: DC | PRN

## 2012-05-03 MED ORDER — METOPROLOL TARTRATE 1 MG/ML IV SOLN: 2.0000 mg | INTRAVENOUS | Status: DC | PRN

## 2012-05-03 MED ORDER — MORPHINE SULFATE 2 MG/ML IJ SOLN
2.0000 mg | INTRAMUSCULAR | Status: DC | PRN
  Administered 2012-05-04: 2 mg via INTRAVENOUS
  Filled 2012-05-03: qty 1

## 2012-05-03 MED ORDER — ONDANSETRON HCL 4 MG/2ML IJ SOLN
INTRAMUSCULAR | Status: DC | PRN
  Administered 2012-05-03: 4 mg via INTRAVENOUS

## 2012-05-03 MED ORDER — SODIUM CHLORIDE 0.9 % IV SOLN
INTRAVENOUS | Status: DC
  Administered 2012-05-03 – 2012-05-07 (×7): via INTRAVENOUS

## 2012-05-03 MED ORDER — 0.9 % SODIUM CHLORIDE (POUR BTL) OPTIME
TOPICAL | Status: DC | PRN
  Administered 2012-05-03: 1000 mL

## 2012-05-03 MED ORDER — FENTANYL CITRATE 0.05 MG/ML IJ SOLN
INTRAMUSCULAR | Status: DC | PRN
  Administered 2012-05-03: 50 ug via INTRAVENOUS

## 2012-05-03 MED ORDER — GUAIFENESIN-DM 100-10 MG/5ML PO SYRP: 15.0000 mL | ORAL_SOLUTION | ORAL | Status: DC | PRN

## 2012-05-03 MED ORDER — INSULIN GLARGINE 100 UNIT/ML ~~LOC~~ SOLN
10.0000 [IU] | Freq: Every day | SUBCUTANEOUS | Status: DC
  Administered 2012-05-04 – 2012-05-07 (×4): 10 [IU] via SUBCUTANEOUS
  Filled 2012-05-03 (×6): qty 0.1

## 2012-05-03 MED ORDER — METOCLOPRAMIDE HCL 10 MG PO TABS
5.0000 mg | ORAL_TABLET | Freq: Three times a day (TID) | ORAL | Status: DC | PRN
  Administered 2012-05-05: 10 mg via ORAL
  Filled 2012-05-03: qty 2

## 2012-05-03 MED ORDER — LIDOCAINE HCL (PF) 1 % IJ SOLN
INTRAMUSCULAR | Status: AC
  Filled 2012-05-03: qty 30

## 2012-05-03 MED ORDER — ACETAMINOPHEN 650 MG RE SUPP: 325.0000 mg | RECTAL | Status: DC | PRN

## 2012-05-03 MED ORDER — INSULIN ASPART 100 UNIT/ML ~~LOC~~ SOLN: 0.0000 [IU] | Freq: Every day | SUBCUTANEOUS | Status: DC

## 2012-05-03 MED ORDER — METOCLOPRAMIDE HCL 5 MG/ML IJ SOLN
5.0000 mg | Freq: Three times a day (TID) | INTRAMUSCULAR | Status: DC | PRN
  Filled 2012-05-03: qty 2

## 2012-05-03 MED ORDER — INSULIN ASPART 100 UNIT/ML ~~LOC~~ SOLN
0.0000 [IU] | Freq: Three times a day (TID) | SUBCUTANEOUS | Status: DC
  Administered 2012-05-04: 5 [IU] via SUBCUTANEOUS
  Administered 2012-05-04: 2 [IU] via SUBCUTANEOUS
  Administered 2012-05-04 – 2012-05-05 (×2): 3 [IU] via SUBCUTANEOUS
  Administered 2012-05-05: 2 [IU] via SUBCUTANEOUS
  Administered 2012-05-05: 3 [IU] via SUBCUTANEOUS
  Administered 2012-05-06: 5 [IU] via SUBCUTANEOUS
  Administered 2012-05-06: 3 [IU] via SUBCUTANEOUS
  Administered 2012-05-06: 2 [IU] via SUBCUTANEOUS
  Administered 2012-05-07: 3 [IU] via SUBCUTANEOUS

## 2012-05-03 MED ORDER — POTASSIUM CHLORIDE CRYS ER 20 MEQ PO TBCR: 20.0000 meq | EXTENDED_RELEASE_TABLET | Freq: Once | ORAL | Status: AC | PRN

## 2012-05-03 MED ORDER — HYDRALAZINE HCL 20 MG/ML IJ SOLN: 10.0000 mg | INTRAMUSCULAR | Status: DC | PRN

## 2012-05-03 MED ORDER — LABETALOL HCL 5 MG/ML IV SOLN: 10.0000 mg | INTRAVENOUS | Status: DC | PRN

## 2012-05-03 MED ORDER — PROPOFOL INFUSION 10 MG/ML OPTIME
INTRAVENOUS | Status: DC | PRN
  Administered 2012-05-03: 100 ug/kg/min via INTRAVENOUS

## 2012-05-03 SURGICAL SUPPLY — 44 items
BANDAGE ELASTIC 4 VELCRO ST LF (GAUZE/BANDAGES/DRESSINGS) ×2 IMPLANT
BANDAGE ELASTIC 6 VELCRO ST LF (GAUZE/BANDAGES/DRESSINGS) ×2 IMPLANT
BANDAGE ESMARK 6X9 LF (GAUZE/BANDAGES/DRESSINGS) IMPLANT
BLADE AVERAGE 25X9 (BLADE) ×2 IMPLANT
BNDG CMPR 9X6 STRL LF SNTH (GAUZE/BANDAGES/DRESSINGS)
BNDG COHESIVE 4X5 TAN STRL (GAUZE/BANDAGES/DRESSINGS) ×2 IMPLANT
BNDG ESMARK 6X9 LF (GAUZE/BANDAGES/DRESSINGS)
CLOTH BEACON ORANGE TIMEOUT ST (SAFETY) ×2 IMPLANT
CUFF TOURNIQUET SINGLE 34IN LL (TOURNIQUET CUFF) IMPLANT
CUFF TOURNIQUET SINGLE 44IN (TOURNIQUET CUFF) IMPLANT
DRAPE U-SHAPE 47X51 STRL (DRAPES) ×4 IMPLANT
DRSG ADAPTIC 3X8 NADH LF (GAUZE/BANDAGES/DRESSINGS) ×2 IMPLANT
DRSG PAD ABDOMINAL 8X10 ST (GAUZE/BANDAGES/DRESSINGS) ×2 IMPLANT
DURAPREP 26ML APPLICATOR (WOUND CARE) ×2 IMPLANT
ELECT REM PT RETURN 9FT ADLT (ELECTROSURGICAL) ×2
ELECTRODE REM PT RTRN 9FT ADLT (ELECTROSURGICAL) ×1 IMPLANT
GAUZE XEROFORM 1X8 LF (GAUZE/BANDAGES/DRESSINGS) ×2 IMPLANT
GLOVE BIOGEL PI IND STRL 7.5 (GLOVE) ×1 IMPLANT
GLOVE BIOGEL PI IND STRL 8 (GLOVE) ×1 IMPLANT
GLOVE BIOGEL PI INDICATOR 7.5 (GLOVE) ×1
GLOVE BIOGEL PI INDICATOR 8 (GLOVE) ×1
GLOVE ECLIPSE 7.0 STRL STRAW (GLOVE) ×2 IMPLANT
GLOVE ORTHO TXT STRL SZ7.5 (GLOVE) ×2 IMPLANT
GOWN PREVENTION PLUS LG XLONG (DISPOSABLE) IMPLANT
GOWN STRL NON-REIN LRG LVL3 (GOWN DISPOSABLE) ×4 IMPLANT
KIT BASIN OR (CUSTOM PROCEDURE TRAY) ×2 IMPLANT
KIT ROOM TURNOVER OR (KITS) ×2 IMPLANT
MANIFOLD NEPTUNE II (INSTRUMENTS) ×2 IMPLANT
NS IRRIG 1000ML POUR BTL (IV SOLUTION) ×2 IMPLANT
PACK ORTHO EXTREMITY (CUSTOM PROCEDURE TRAY) ×2 IMPLANT
PAD ARMBOARD 7.5X6 YLW CONV (MISCELLANEOUS) ×4 IMPLANT
PAD CAST 4YDX4 CTTN HI CHSV (CAST SUPPLIES) ×1 IMPLANT
PADDING CAST COTTON 4X4 STRL (CAST SUPPLIES) ×2
SPONGE GAUZE 4X4 12PLY (GAUZE/BANDAGES/DRESSINGS) ×2 IMPLANT
SPONGE LAP 18X18 X RAY DECT (DISPOSABLE) ×2 IMPLANT
STAPLER VISISTAT 35W (STAPLE) ×2 IMPLANT
STOCKINETTE IMPERVIOUS LG (DRAPES) IMPLANT
SUCTION FRAZIER TIP 10 FR DISP (SUCTIONS) ×2 IMPLANT
SUT ETHILON 2 0 PSLX (SUTURE) ×4 IMPLANT
TOWEL OR 17X24 6PK STRL BLUE (TOWEL DISPOSABLE) ×2 IMPLANT
TOWEL OR 17X26 10 PK STRL BLUE (TOWEL DISPOSABLE) ×2 IMPLANT
TUBE CONNECTING 12X1/4 (SUCTIONS) ×2 IMPLANT
UNDERPAD 30X30 INCONTINENT (UNDERPADS AND DIAPERS) ×2 IMPLANT
WATER STERILE IRR 1000ML POUR (IV SOLUTION) ×2 IMPLANT

## 2012-05-03 NOTE — Brief Op Note (Signed)
05/02/2012 - 05/03/2012  7:28 PM  PATIENT:  Roseanna Rainbow Ruark  52 y.o. male  PRE-OPERATIVE DIAGNOSIS:  Right Great Toe Gangrene  POST-OPERATIVE DIAGNOSIS:  Right Great Toe Gangrene  PROCEDURE:  Procedure(s): Right First Ray AMPUTATION (Right)  SURGEON:  Surgeon(s) and Role:    * Eldred Manges, MD - Primary  PHYSICIAN ASSISTANT:   ASSISTANTS: none   ANESTHESIA:   regional and IV sedation  EBL:     BLOOD ADMINISTERED:none  DRAINS: none   LOCAL MEDICATIONS USED:  NONE  SPECIMEN:  Source of Specimen:  right great toe  DISPOSITION OF SPECIMEN:  PATHOLOGY  COUNTS:  YES  TOURNIQUET:  * Missing tourniquet times found for documented tourniquets in log:  91103 *  DICTATION: .Dragon Dictation  PLAN OF CARE: still inpatient  PATIENT DISPOSITION:  PACU - hemodynamically stable.   Delay start of Pharmacological VTE agent (>24hrs) due to surgical blood loss or risk of bleeding: not applicable

## 2012-05-03 NOTE — ED Provider Notes (Signed)
Medical screening examination/treatment/procedure(s) were conducted as a shared visit with non-physician practitioner(s) and myself.  I personally evaluated the patient during the encounter. Right great toe with significant necrosis. I long discussion with patient and highly recommended that he be admitted to the hospital for IV antibiotics, x-rays and orthopedic consultation.  Patient very much wants to go home, states he has a followup appointment with his physician in the morning who has been following his toe very closely and prefers to get his opinion.  Patient declines any imaging or labs at this time. His wound was redressed with a sterile dressing and he was discharged home with understanding position recommendations for admit/ likely needs amputation.  Sunnie Nielsen, MD 05/03/12 [phone]

## 2012-05-03 NOTE — H&P (View-Only) (Signed)
Reason for Consult:right great toe gangrene Referring Physician:  Bebe Shaggy MD,    Joanne Gavel MD wound clinic  Lawrence [name] is an 52 y.o. male.  HPI: IDDM since age 73 with 3 wks treatment of right great toe ulcer which has progressed to gangrene over last 5 days  Past Medical History  Diagnosis Date  . Diabetes mellitus   . Hypertension     History reviewed. No pertinent past surgical history.  No family history on file.  Social History:  reports that he has never smoked. He has never used smokeless tobacco. He reports that he does not drink alcohol or use illicit drugs.  Allergies: No Known Allergies  Medications: I have reviewed the patient's current medications.  Results for orders placed during the hospital encounter of 05/02/12 (from the past 48 hour(s))  CBC WITH DIFFERENTIAL     Status: Abnormal   Collection Time    05/02/12  7:14 PM      Result Value Range   WBC 10.4  4.0 - 10.5 K/uL   RBC 4.14 (*) 4.22 - 5.81 MIL/uL   Hemoglobin 11.3 (*) 13.0 - 17.0 g/dL   HCT 16.1 (*) 09.6 - 04.5 %   MCV 77.3 (*) 78.0 - 100.0 fL   MCH 27.3  26.0 - 34.0 pg   MCHC 35.3  30.0 - 36.0 g/dL   RDW 40.9  81.1 - 91.4 %   Platelets 267  150 - 400 K/uL   Neutrophils Relative 79 (*) 43 - 77 %   Neutro Abs 8.2 (*) 1.7 - 7.7 K/uL   Lymphocytes Relative 13  12 - 46 %   Lymphs Abs 1.4  0.7 - 4.0 K/uL   Monocytes Relative 7  3 - 12 %   Monocytes Absolute 0.8  0.1 - 1.0 K/uL   Eosinophils Relative 1  0 - 5 %   Eosinophils Absolute 0.1  0.0 - 0.7 K/uL   Basophils Relative 0  0 - 1 %   Basophils Absolute 0.0  0.0 - 0.1 K/uL  GLUCOSE, CAPILLARY     Status: None   Collection Time    05/02/12  7:49 PM      Result Value Range   Glucose-Capillary 81  70 - 99 mg/dL    No results found.  Review of Systems  Constitutional: Positive for weight loss and malaise/fatigue. Negative for fever and chills.  Eyes: Negative for blurred vision, double vision and photophobia.  Respiratory: Negative.    Cardiovascular: Negative.   Gastrointestinal: Negative.        IDDM on insulin for more than 30 yrs  Genitourinary: Negative.   Skin:       Gangrene right great toe.   Neurological: Negative for headaches.       Peripheral neuropathy bilat LE  Endo/Heme/Allergies:       IDDM   Blood pressure 180/74, pulse 75, temperature 98.1 F (36.7 [name]), temperature source Oral, resp. rate 18, SpO2 100.00%. Physical Exam  HENT:  Head: Normocephalic.  Right eye upper eyelid ecymosis secondary to fall 5 PM   Eyes:  Eye vision good.   Neck: Normal range of motion.  Cardiovascular: Normal rate.   Respiratory: Effort normal.  GI: Soft.  Musculoskeletal:  Right dry gangrene to base of toe necrotic with odor. Extends to MCP joint  Psychiatric: He has a normal mood and affect. His behavior is normal. Thought content normal.    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   Will need arterial doppler test to check LE arterial supply.     Vascular consult recommended.    Will post for surgery tomorrow evening for right 1st ray amputation.  If arterial dopplers suggest that revasc /stenting could help arterial foot blood supply then would cancel surgery and not do tomorrow evening about 6 or 7 PM.  Discussed with pt. He understands and agrees to proceed.    Madelyn Tlatelpa,Lawrence [name] 05/02/2012, 8:06 PM

## 2012-05-03 NOTE — Progress Notes (Addendum)
Inpatient Diabetes Program Recommendations  AACE/ADA: New Consensus Statement on Inpatient Glycemic Control (2013)  Target Ranges:  Prepandial:   less than 140 mg/dL      Peak postprandial:   less than 180 mg/dL (1-2 hours)      Critically ill patients:  140 - 180 mg/dL    Received referral for this patient.  Admitted with gangrenous toe.  For arteriogram today and possible amputation tomorrow morning per Ortho.  Results for KRISHAN, MCBREEN (MRN [id number]) as of 05/03/2012 14:16  Ref. Range 02/22/2012 08:41 05/02/2012 19:14  Hemoglobin A1C Latest Range: <5.7 % 12.6 11.9 (H)   A1c levels significantly elevated since January.  Patient sees Dr. [name] for primary care at the [facility].  Spoke with patient about his elevated A1c.Patient told me he has not taken his Lantus regularly b/c of the cost aspect.  Has no problem getting oral medications (gets them at [employer] for $4) however, the Lantus insulin is costing him $150 out of pocket (patient has no health insurance coverage). Patient told me he has no problems taking insulin (has used both vial and pens before), however, he cannot afford Lantus at this point.  Discussed less expensive insulin options with patient.  Patient told me Dr. [name] (PCP) has broached the subject with him and that they are supposed to discuss other insulin options at his next appointment in May.  Told patient about the cheaper insulin options available at [facility] (Reli-on brand NPH or 70/30 insulin) for $25 per vial.  Will follow while inpatient. Ambrose Finland RN, MSN, CDE Diabetes Coordinator Inpatient Diabetes Program [phone]

## 2012-05-03 NOTE — Consult Note (Signed)
[facility] [place] Consult Note   Requesting: Gaspar Bidding Reason for consult: Gangrene right first toe History of Present Illness:  Patient is a 52 y.o. year old male who presents for evaluation of right first toe gangrene.  The patient had a callus on the first toe which broke down.  He has been followed at the wound center for several weeks with slowly progressive worsening of the wound.  He was sent to the ER yesterday by the wound center for further management.  He has some pain in the toe but reasonably managed.  The toe has developed foul smell.  No prior episodes.  Other medical problems include diabetes poorly controlled, hypertension, renal dysfunction, hyperlipidemia currently managed by primary service.  He denies history of coronary artery disease.  Past Medical History  Diagnosis Date  . Diabetes mellitus   . Hypertension   . Chronic kidney disease     History reviewed. No pertinent past surgical history.   Social History History  Substance Use Topics  . Smoking status: Never Smoker   . Smokeless tobacco: Never Used  . Alcohol Use: No    Family History No family history on file.  Allergies  No Known Allergies   Current Facility-Administered Medications  Medication Dose Route Frequency Provider Last Rate Last Dose  . 0.45 % sodium chloride infusion   Intravenous Continuous Sherren Kerns, MD      . 0.9 %  sodium chloride infusion   Intravenous Continuous Andrena Mews, DO 20 mL/hr at 05/02/12 2309    . 0.9 %  sodium chloride infusion   Intravenous Continuous Sherren Kerns, MD      . acetaminophen (TYLENOL) tablet 650 mg  650 mg Oral Q6H PRN Andrena Mews, DO       Or  . acetaminophen (TYLENOL) suppository 650 mg  650 mg Rectal Q6H PRN Andrena Mews, DO      . aspirin chewable tablet 81 mg  81 mg Oral Daily Nestor Ramp, MD      . atorvastatin (LIPITOR) tablet 40 mg  40 mg Oral q1800 Andrena Mews, DO      . heparin injection  5,000 Units  5,000 Units Subcutaneous Q8H Andrena Mews, DO   5,000 Units at 05/03/12 [phone]  . HYDROcodone-acetaminophen (NORCO/VICODIN) 5-325 MG per tablet 1-2 tablet  1-2 tablet Oral Q4H PRN Andrena Mews, DO      . insulin aspart (novoLOG) injection 0-20 Units  0-20 Units Subcutaneous TID WC Andrena Mews, DO      . insulin aspart (novoLOG) injection 0-5 Units  0-5 Units Subcutaneous QHS Andrena Mews, DO      . metoprolol (LOPRESSOR) injection 5 mg  5 mg Intravenous Q2H PRN Andrena Mews, DO   5 mg at 05/02/12 2129  . metoprolol tartrate (LOPRESSOR) tablet 25 mg  25 mg Oral BID Andrena Mews, DO   25 mg at 05/02/12 2310  . pantoprazole (PROTONIX) EC tablet 40 mg  40 mg Oral Daily Andrena Mews, DO      . piperacillin-tazobactam (ZOSYN) IVPB 3.375 g  3.375 g Intravenous Q8H Drake Leach Rumbarger, RPH   3.375 g at 05/03/12 9604  . traZODone (DESYREL) tablet 25 mg  25 mg Oral QHS PRN Glori Luis, MD   25 mg at 05/03/12 0014  . vancomycin (VANCOCIN) IVPB 1000 mg/200 mL premix  1,000 mg Intravenous Q12H Drake Leach Rumbarger, RPH   1,000  mg at 05/03/12 0052    ROS:   General:  No weight loss, Fever, chills  HEENT: No recent headaches, no nasal bleeding, no visual changes, no sore throat  Neurologic: No dizziness, blackouts, seizures. No recent symptoms of stroke or mini- stroke. No recent episodes of slurred speech, or temporary blindness.  Cardiac: No recent episodes of chest pain/pressure, no shortness of breath at rest.  No shortness of breath with exertion.  Denies history of atrial fibrillation or irregular heartbeat  Vascular: No history of rest pain in feet.  No history of claudication.  +history of non-healing ulcer, No history of DVT   Pulmonary: No home oxygen, no productive cough, no hemoptysis,  No asthma or wheezing  Musculoskeletal:  [ ]  Arthritis, [ ]  Low back pain,  [ ]  Joint pain  Hematologic:No history of hypercoagulable state.  No history of easy  bleeding.  No history of anemia  Gastrointestinal: No hematochezia or melena,  No gastroesophageal reflux, no trouble swallowing  Urinary: [x ] chronic Kidney disease, [ ]  on HD - [ ]  MWF or [ ]  TTHS, [ ]  Burning with urination, [ ]  Frequent urination, [ ]  Difficulty urinating;   Skin: No rashes  Psychological: No history of anxiety,  No history of depression   Physical Examination  Filed Vitals:   05/02/12 2100 05/02/12 2200 05/02/12 2239 05/03/12 0632  BP:  133/57 150/97 155/82  Pulse:  80 85 97  Temp:   98.8 F (37.1 C) 98.6 F (37 C)  TempSrc:   Oral Oral  Resp:   18 18  Height: 5' 4.96" (1.65 m)     Weight: 192 lb 14.4 oz (87.5 kg)     SpO2:  99% 99% 99%    Body mass index is 32.14 kg/(m^2).  General:  Alert and oriented, no acute distress HEENT: Normal Neck: No bruit or JVD Pulmonary: Clear to auscultation bilaterally Cardiac: Regular Rate and Rhythm without murmur Abdomen: Soft, non-tender, non-distended, no mass, obese Skin: No rash, gangrene right first toe extending to metatarsal joint, mild erythema on foot Extremity Pulses:  2+ radial, brachial, femoral, 2+ right popliteal pulse absent left popliteal pulse absent dorsalis pedis, posterior tibial pulses bilaterally Musculoskeletal: No deformity or edema  Neurologic: Upper and lower extremity motor 5/5 and symmetric  DATA: Recent ABI greater than 1 approx 1 month ago but monophasic wave forms suggestive of vessel calcification most likely over estimated   BMET    Component Value Date/Time   NA 140 05/03/2012 0610   K 4.2 05/03/2012 0610   CL 104 05/03/2012 0610   CO2 25 05/03/2012 0610   GLUCOSE 196* 05/03/2012 0610   BUN 35* 05/03/2012 0610   CREATININE 1.45* 05/03/2012 0610   CREATININE 1.05 02/22/2012 0956   CALCIUM 9.2 05/03/2012 0610   GFRNONAA 54* 05/03/2012 0610   GFRAA 63* 05/03/2012 0610    CBC    Component Value Date/Time   WBC 7.0 05/03/2012 0610   RBC 3.69* 05/03/2012 0610   HGB 9.9* 05/03/2012  0610   HCT 29.4* 05/03/2012 0610   PLT 237 05/03/2012 0610   MCV 79.7 05/03/2012 0610   MCH 26.8 05/03/2012 0610   MCHC 33.7 05/03/2012 0610   RDW 13.2 05/03/2012 0610   LYMPHSABS 1.4 05/02/2012 1914   MONOABS 0.8 05/02/2012 1914   EOSABS 0.1 05/02/2012 1914   BASOSABS 0.0 05/02/2012 1914      ASSESSMENT: Gangrene right first toe, most likely tibial disease   PLAN:  Needs arteriogram  with runoff prior to amputation of toe.  Will consider intervention percutaneuously but most likely will need to consider bypass.  If he has no revascularization options will have Dr Ophelia Charter proceed with toe amp today and hopefully it will heal.  Pt understands high risk of limb loss.  Also, risks benefits and possible complications of arteriogram with intervention were explained to the pt including but no limited to contrast nephropathy, bleeding infection, vessel injury.  He agrees to proceed.   Keep NPO  Arteriogram later today  Will hydrate now IV for slightly increased creatinine.  Fabienne Bruns, MD [facility] [place] Office: [phone] Pager: [phone]   Fabienne Bruns, MD [facility] [place] Office: [phone] Pager: [phone]

## 2012-05-03 NOTE — Interval H&P Note (Signed)
History and Physical Interval Note:  05/03/2012 6:29 PM  Lawrence [name]  has presented today for surgery, with the diagnosis of Right Great Toe Gangrene  The various methods of treatment have been discussed with the patient and family. After consideration of risks, benefits and other options for treatment, the patient has consented to  Procedure(s): Right First Ray AMPUTATION (Right) as a surgical intervention .  The patient's history has been reviewed, patient examined, no change in status, stable for surgery.  I have reviewed the patient's chart and labs.  Questions were answered to the patient's satisfaction.  Arteriogram shows significant disease with poor flow to foot. No surgical arterial disease. He understands that 1st ray amputation may not  heal and BKA may be required.  Diabetes times 30 plus yrs and extensive narrowing with small vessel disease.      [name],Franchot C

## 2012-05-03 NOTE — Preoperative (Signed)
Beta Blockers   Reason not to administer Beta Blockers:Not Applicable 

## 2012-05-03 NOTE — Anesthesia Preprocedure Evaluation (Addendum)
Anesthesia Evaluation  Patient identified by MRN, date of birth, ID band Patient awake    Reviewed: Allergy & Precautions, H&P , NPO status , Patient's Chart, lab work & pertinent test results  History of Anesthesia Complications Negative for: history of anesthetic complications  Airway Mallampati: I TM Distance: >3 FB Neck ROM: full    Dental   Pulmonary neg pulmonary ROS,  breath sounds clear to auscultation  Pulmonary exam normal       Cardiovascular hypertension, + Peripheral Vascular Disease Rhythm:regular Rate:Normal     Neuro/Psych negative neurological ROS     GI/Hepatic negative GI ROS,   Endo/Other  diabetes (glu 108), Type 2, Insulin Dependent and Oral Hypoglycemic AgentsMorbid obesity  Renal/GU Renal InsufficiencyRenal disease (creat 1.45)     Musculoskeletal   Abdominal (+) + obese,   Peds  Hematology   Anesthesia Other Findings   Reproductive/Obstetrics                          Anesthesia Physical Anesthesia Plan  ASA: III  Anesthesia Plan: Regional and MAC   Post-op Pain Management:    Induction: Intravenous  Airway Management Planned: Simple Face Mask and Natural Airway  Additional Equipment:   Intra-op Plan:   Post-operative Plan:   Informed Consent: I have reviewed the patients History and Physical, chart, labs and discussed the procedure including the risks, benefits and alternatives for the proposed anesthesia with the patient or authorized representative who has indicated his/her understanding and acceptance.     Plan Discussed with: CRNA, Anesthesiologist and Surgeon  Anesthesia Plan Comments:        Anesthesia Quick Evaluation

## 2012-05-03 NOTE — H&P (Signed)
FMTS Attending Admission Note: Sara Neal MD [phone] pager office [phone] I  have seen and examined this patient, reviewed their chart. I have discussed this patient with the resident. I agree with the resident's findings, assessment and care plan. 

## 2012-05-03 NOTE — Progress Notes (Signed)
UR completed 

## 2012-05-03 NOTE — Anesthesia Postprocedure Evaluation (Signed)
  Anesthesia Post-op Note  Patient: Lawrence [name]  Procedure(s) Performed: Procedure(s): Right First Ray AMPUTATION (Right)  Patient Location: PACU  Anesthesia Type:MAC and MAC combined with regional for post-op pain  Level of Consciousness: awake, alert , oriented and patient cooperative  Airway and Oxygen Therapy: Patient Spontanous Breathing  Post-op Pain: none  Post-op Assessment: Post-op Vital signs reviewed, Patient's Cardiovascular Status Stable, Respiratory Function Stable, Patent Airway, No signs of Nausea or vomiting and Pain level controlled  Post-op Vital Signs: Reviewed and stable  Complications: No apparent anesthesia complications

## 2012-05-03 NOTE — Op Note (Signed)
      [facility]           [facility] [place]   Procedure: Aortogram with right lower extremity runoff  Preoperative diagnosis: Gangrene right foot  Postoperative diagnosis: Same  Anesthesia Local  Operative details: After obtaining informed consent, the patient was taken to the PV LAB. The patient was placed in supine position on the Angio table. Both groins were prepped and draped in usual sterile fashion. Local anesthesia was infiltrated over the left common femoral artery. Initially, ultrasound was used to identify the common femoral artery and an attempt was made to use a micropuncture to establish access.    An introducer needle was used to cannulate the left common femoral artery and 035 versacore wire threaded into the abdominal aorta under fluoroscopic guidance. Next a 5 French sheath is placed over the guidewire in the left common femoral artery. A 5 French pigtail catheter was placed over the guidewire into the abdominal aorta and abdominal aortogram was obtained.   And right renal arteries are patent. The infrarenal abdominal aorta is patent. The left and right common external and internal iliac arteries are patent.   In order to get increased opacification of the tibials a 5 Fr crossover catheter was brought up on the field.  The crossover catheter was used to selectively catheterize the right common iliac artery and the guidewire advanced into the right distal external iliac artery. The crossover catheter was removed and replaced with a 5 French straight catheter. Angiogram was then performed the right lower extremity. The right common femoral artery is patent. The right superficial femoral artery is patent. The right profunda femoris artery is patent but with narrowing distally in a typical diabetic pattern. The right popliteal artery is patent. However there is a 50% narrowing just above the knee joint. The anterior tibial artery is occluded. The peroneal artery is  occluded. The posterior tibial artery is patent but there is a mid 75% exophytic stenosis less than 1 cm in length. The posterior tibial artery is patent at the ankle. However there is blunting of the digital vessels and none of these are very well visualized. There is also blunting of the distal arteries in the foot with an incomplete plantar arch.  Next the 5 French straight catheter was removed over a guidewire.    The 5 Fr sheath was left in place to be pulled in the holding area. The patient tolerated the procedure well and there were no complications. Patient was taken to the holding area in stable condition.  Operative findings: Patent aortoiliac and superficial femoral arteries. 50% stenosis right popliteal artery. 75% stenosis very short segment right posterior tibial artery exophytic not amenable to percutaneous intervention. Occluded right anterior tibial and peroneal arteries. Diabetic small vessel disease right foot.   Operative management: Ray amputation by Dr. Ophelia Charter later today. If this does not heal, he may require a below-knee amputation as he has posterior tibial runoff to the right foot intact.  Fabienne Bruns, MD [facility] [place] Office: [phone] Pager: [phone]

## 2012-05-03 NOTE — Progress Notes (Signed)
FMTS Attending Daily Note: Sara Neal MD [phone] pager office [phone] I  have seen and examined this patient, reviewed their chart. I have discussed this patient with the resident. I agree with the resident's findings, assessment and care plan. 

## 2012-05-03 NOTE — Transfer of Care (Signed)
Immediate Anesthesia Transfer of Care Note  Patient: Lawrence [name]  Procedure(s) Performed: Procedure(s): Right First Ray AMPUTATION (Right)  Patient Location: PACU  Anesthesia Type:MAC combined with regional for post-op pain  Level of Consciousness: awake, oriented, patient cooperative and responds to stimulation  Airway & Oxygen Therapy: Patient Spontanous Breathing and Patient connected to face mask oxygen  Post-op Assessment: Report given to PACU RN, Post -op Vital signs reviewed and stable, Patient moving all extremities and Patient moving all extremities X 4  Post vital signs: Reviewed and stable  Complications: No apparent anesthesia complications

## 2012-05-03 NOTE — Anesthesia Procedure Notes (Addendum)
Anesthesia Regional Block:  Ankle block  Pre-Anesthetic Checklist: ,, timeout performed, Correct Patient, Correct Site, Correct Laterality, Correct Procedure, Correct Position, site marked, Risks and benefits discussed,  Surgical consent,  Pre-op evaluation,  At surgeon's request and post-op pain management  Laterality: Right  Prep: Maximum Sterile Barrier Precautions used, chloraprep and alcohol swabs       Needles:  Injection technique: Single-shot      Needle Gauge: 25 and 25 G  Needle insertion depth: 4 cm   Additional Needles: Ankle block Narrative:  Start time: 05/03/2012 5:45 PM End time: 05/03/2012 5:50 PM Injection made incrementally with aspirations every 5 mL.  Performed by: Personally  Anesthesiologist: Maren Beach MD  Additional Notes: Pt accepts procedure w/ risks. 25cc ( 15cc 2% Lidocaine and 10cc 0.5% Marcaine w/ epi ) w/o dfficulty.  Ant and Post Tibial Nerves. GES   Procedure Name: MAC Date/Time: 05/03/2012 6:47 PM Performed by: Wray Kearns A Pre-anesthesia Checklist: Patient identified, Timeout performed, Emergency Drugs available, Suction available and Patient being monitored Patient Re-evaluated:Patient Re-evaluated prior to inductionOxygen Delivery Method: Simple face mask Preoxygenation: Pre-oxygenation with 100% oxygen Intubation Type: IV induction Dental Injury: Teeth and Oropharynx as per pre-operative assessment

## 2012-05-03 NOTE — Progress Notes (Signed)
Notified Family Medicine physician on call that pt did not have SSI orders.

## 2012-05-03 NOTE — Progress Notes (Addendum)
PGY-1 Daily Progress Note Family Medicine Teaching Service Eli C. Arlana Pouch, MS4 Service Pager: [phone]   Subjective: No acute events. Comfortable in bed. Vascular saw him this AM. Aware of and agrees with plan.   Objective:  VITALS Temp:  [98.1 F (36.7 C)-98.8 F (37.1 C)] 98.6 F (37 C) (03/26 4540) Pulse Rate:  [75-97] 97 (03/26 0632) Resp:  [18] 18 (03/26 0632) BP: (133-198)/(57-97) 155/82 mmHg (03/26 0632) SpO2:  [99 %-100 %] 99 % (03/26 9811) Weight:  [87.5 kg (192 lb 14.4 oz)] 87.5 kg (192 lb 14.4 oz) (03/25 2100)  In/Out  Intake/Output Summary (Last 24 hours) at 05/03/12 0900 Last data filed at 05/03/12 9147  Gross per 24 hour  Intake 346.33 ml  Output      0 ml  Net 346.33 ml    Physical Exam: Gen:  NAD HEENT: moist mucous membranes CV: Regular rate and rhythm, no murmurs rubs or gallops PULM: clear to auscultation bilaterally. No wheezes/rales/rhonchi ABD: soft/nontender/nondistended/normal bowel sounds EXT: No edema. 1+ dorsalis pedis and posterior tibial pulses bilaterally. Right great toe black, necrotic and foul smelling. Extends to MCP. Ulcer noted at base of great toe with purulent drainage.  Neuro: Alert and oriented x3  MEDS Scheduled Meds: . aspirin  81 mg Oral Daily  . atorvastatin  40 mg Oral q1800  . heparin  5,000 Units Subcutaneous Q8H  . insulin aspart  0-20 Units Subcutaneous TID WC  . insulin aspart  0-5 Units Subcutaneous QHS  . metoprolol tartrate  25 mg Oral BID  . pantoprazole  40 mg Oral Daily  . piperacillin-tazobactam (ZOSYN)  IV  3.375 g Intravenous Q8H  . vancomycin  1,000 mg Intravenous Q12H   Continuous Infusions: . sodium chloride 125 mL/hr at 05/03/12 0847  . sodium chloride 20 mL/hr at 05/02/12 2309  . sodium chloride 999 mL/hr at 05/03/12 0834   PRN Meds:.acetaminophen, acetaminophen, HYDROcodone-acetaminophen, metoprolol, traZODone  Labs and imaging:   CBC  Recent Labs Lab 05/02/12 1914 05/03/12 0610  WBC 10.4  7.0  HGB 11.3* 9.9*  HCT 32.0* 29.4*  PLT 267 237   BMET/CMET  Recent Labs Lab 05/02/12 1914 05/03/12 0610  NA 138 140  K 4.0 4.2  CL 102 104  CO2 25 25  BUN 32* 35*  CREATININE 1.23 1.45*  CALCIUM 10.0 9.2  GLUCOSE 93 196*   Results for orders placed during the hospital encounter of 05/02/12 (from the past 24 hour(s))  BASIC METABOLIC PANEL     Status: Abnormal   Collection Time    05/02/12  7:14 PM      Result Value Range   Sodium 138  135 - 145 mEq/L   Potassium 4.0  3.5 - 5.1 mEq/L   Chloride 102  96 - 112 mEq/L   CO2 25  19 - 32 mEq/L   Glucose, Bld 93  70 - 99 mg/dL   BUN 32 (*) 6 - 23 mg/dL   Creatinine, Ser 8.29  0.50 - 1.35 mg/dL   Calcium 56.2  8.4 - 13.0 mg/dL   GFR calc non Af Amer 66 (*) >90 mL/min   GFR calc Af Amer 77 (*) >90 mL/min  CBC WITH DIFFERENTIAL     Status: Abnormal   Collection Time    05/02/12  7:14 PM      Result Value Range   WBC 10.4  4.0 - 10.5 K/uL   RBC 4.14 (*) 4.22 - 5.81 MIL/uL   Hemoglobin 11.3 (*) 13.0 - 17.0  g/dL   HCT 16.1 (*) 09.6 - 04.5 %   MCV 77.3 (*) 78.0 - 100.0 fL   MCH 27.3  26.0 - 34.0 pg   MCHC 35.3  30.0 - 36.0 g/dL   RDW 40.9  81.1 - 91.4 %   Platelets 267  150 - 400 K/uL   Neutrophils Relative 79 (*) 43 - 77 %   Neutro Abs 8.2 (*) 1.7 - 7.7 K/uL   Lymphocytes Relative 13  12 - 46 %   Lymphs Abs 1.4  0.7 - 4.0 K/uL   Monocytes Relative 7  3 - 12 %   Monocytes Absolute 0.8  0.1 - 1.0 K/uL   Eosinophils Relative 1  0 - 5 %   Eosinophils Absolute 0.1  0.0 - 0.7 K/uL   Basophils Relative 0  0 - 1 %   Basophils Absolute 0.0  0.0 - 0.1 K/uL  GLUCOSE, CAPILLARY     Status: None   Collection Time    05/02/12  7:49 PM      Result Value Range   Glucose-Capillary 81  70 - 99 mg/dL  GLUCOSE, CAPILLARY     Status: None   Collection Time    05/02/12  9:54 PM      Result Value Range   Glucose-Capillary 95  70 - 99 mg/dL  BASIC METABOLIC PANEL     Status: Abnormal   Collection Time    05/03/12  6:10 AM       Result Value Range   Sodium 140  135 - 145 mEq/L   Potassium 4.2  3.5 - 5.1 mEq/L   Chloride 104  96 - 112 mEq/L   CO2 25  19 - 32 mEq/L   Glucose, Bld 196 (*) 70 - 99 mg/dL   BUN 35 (*) 6 - 23 mg/dL   Creatinine, Ser 7.82 (*) 0.50 - 1.35 mg/dL   Calcium 9.2  8.4 - 95.6 mg/dL   GFR calc non Af Amer 54 (*) >90 mL/min   GFR calc Af Amer 63 (*) >90 mL/min  CBC     Status: Abnormal   Collection Time    05/03/12  6:10 AM      Result Value Range   WBC 7.0  4.0 - 10.5 K/uL   RBC 3.69 (*) 4.22 - 5.81 MIL/uL   Hemoglobin 9.9 (*) 13.0 - 17.0 g/dL   HCT 21.3 (*) 08.6 - 57.8 %   MCV 79.7  78.0 - 100.0 fL   MCH 26.8  26.0 - 34.0 pg   MCHC 33.7  30.0 - 36.0 g/dL   RDW 46.9  62.9 - 52.8 %   Platelets 237  150 - 400 K/uL  GLUCOSE, CAPILLARY     Status: Abnormal   Collection Time    05/03/12  8:05 AM      Result Value Range   Glucose-Capillary 202 (*) 70 - 99 mg/dL   X-ray Chest Pa And Lateral   05/02/2012  *RADIOLOGY REPORT*  Clinical Data: Preop for toe amputation.  CHEST - 2 VIEW  Comparison: 02/22/2012.  Findings: The heart is borderline enlarged but stable.  The mediastinal and hilar contours are prominent but unchanged.  Low lung volumes with vascular crowding and atelectasis but no infiltrates or effusions.  The bony thorax is intact.  IMPRESSION: No acute cardiopulmonary findings.   Original Report Authenticated By: Rudie Meyer, M.D.     Assessment/Plan Lawrence [name] is a 52 y.o. year old male presenting with dry gangrene  of right great toe.  # Gangrene of Right great toe   - Orthopedics consulted in the ED. Rvaluation by Vascular surgery given significant calcifications seen on foot xray. LE Arterial Duplex already performed on 3/21--RLE monophasic w/ ABI of 1.21; LLE WNL - Aortagram planned 3/26 with goal of improving flow to RLE for healing of ray amputation. ] - Ray amputation right great toe planned 3/27 AM - Empiric antibiotics - Vanc/Zosyn per pharmacy.  - Pain control -  Vicodin 5-325 mg, 1-2 tablets Q4 PRN  - Chest xray, EKG, and TDAP in preparation for surgery  - CBC and BMP in the am. 3/26 Cr bump from 1.23 to 1.45--likely pre-renal. 1L bolus NS this AM with continual infusion per vascular rec pre-aortagram.  Will need to monitor closely following procedure given dye load and vancomycin.    # DM-2, uncontrolled  - SSI, resistant  -holding home glipizide. 1/2 home lantus as NPO. No sliding scale.   - CBG checks TID AC and QHS  - Diabetes education during admission  - Will obtain A1C   # HTN  - Will continue Lopressor 25 mg BID, Lopressor 5 mg IV Q2PRN for SBP 160  - Will add addition agents as needed.   # HLD  - Starting Lipitor 40 mg.   # CKD, stage 1  - Will monitor BUN/Creatinine closely during admission.  - 3/26 Cr bump from 1.23 to 1.45--likely pre-renal. 1L bolus NS this AM with continual infusion per vascular rec pre-aortagram.  Will need to monitor closely following procedure given dye load and vancomycin.    FEN/GI: Carb modified diet. NPO pre-procedure and surgery at this time.   Prophylaxis: Heparin SQ, PPI  Disposition: Pending surgery and clinical improvement  Code Status: Full code  Eli C. Arlana Pouch MS4 of [facility] 05/03/2012, 9:00 AM  Family Medicine Upper Level Addendum:   I have seen and examined the patient independently, discussed with Ellin Goodie MSIV, fully reviewed the progress note and agree with it's contents with the additions as noted in blue text (none). My independent exam is below.   S: Patient has some pain in toe and has some concerns about losing toe/leg but no other complaints.   O: BP 145/68  Pulse 79  Temp(Src) 98.9 F (37.2 C) (Oral)  Resp 18  Ht 5' 4.96" (1.65 m)  Wt 192 lb 14.4 oz (87.5 kg)  BMI 32.14 kg/m2  SpO2 97% Gen: NAD, resting comfortably in bed HEENT: slightly dry mucus membranes CV: RRR no mrg  Lungs: CTAB except  faint crackle in bases Abd: soft/nontender/nondistended/normal  bowel sounds  MSK: moves all extremities, right 1st toe black in appearance and odorous with some surrounding.  erythema on side of foot.. 1+ dp pulse on left, faint pulse on right.  Skin: warm and dry, no rash  Neuro: grossly normal, moves all extremities  A/P:  80 M with dry gangrene of right 1st toe  -Vascular evaluating with aortogram today after monophasic ABI right leg with calcified vessels and likely needs revascularization before proceding with amputation -Orthopedics planning for 1st amputation AM 3/27 after revasularization -continue vanc/zosyn for now. Depending on margins and if BKA performed, may need 24-48 hrs after surgery -pain control with oral meds before surgery -preop with unremarkable CXR and EKG. Patient on statin and aspirin. Optimized as much as possible for surgery. HTN mod control on lopressor only and has prn lopressor for excess.   #AKI likely due to NPO with [facility]  fluids, bolused by vascular and 125 hr 1/2ns after that. F/u BMET each AM. Risk for worsening with dye load from aortogram.   Tana Conch, MD, PGY2 05/03/2012 12:12 PM

## 2012-05-04 ENCOUNTER — Encounter (HOSPITAL_COMMUNITY): Payer: Self-pay | Admitting: Orthopaedic Surgery

## 2012-05-04 DIAGNOSIS — E7800 Pure hypercholesterolemia, unspecified: Secondary | ICD-10-CM

## 2012-05-04 LAB — BASIC METABOLIC PANEL
BUN: 32 mg/dL — ABNORMAL HIGH (ref 6–23)
Creatinine, Ser: 1.77 mg/dL — ABNORMAL HIGH (ref 0.50–1.35)
GFR calc Af Amer: 50 mL/min — ABNORMAL LOW (ref >90)
GFR calc non Af Amer: 43 mL/min — ABNORMAL LOW (ref >90)
Potassium: 3.9 mEq/L (ref 3.5–5.1)

## 2012-05-04 LAB — CBC
Hemoglobin: 9 g/dL — ABNORMAL LOW (ref 13.0–17.0)
MCHC: 33.1 g/dL (ref 30.0–36.0)
RDW: 13.3 % (ref 11.5–15.5)

## 2012-05-04 LAB — GLUCOSE, CAPILLARY: Glucose-Capillary: 161 mg/dL — ABNORMAL HIGH (ref 70–99)

## 2012-05-04 NOTE — Progress Notes (Signed)
FMTS Attending Admission Note: Lawrence Sarin,MD I  have seen and examined this patient, reviewed their chart. I have discussed this patient with the resident. I agree with the resident's findings, assessment and care plan.  

## 2012-05-04 NOTE — Op Note (Signed)
NAMEALEXANDRU, Lawrence [name]                 ACCOUNT NO.:  [id number]  MEDICAL RECORD NO.:  [id number]  LOCATION:  [facility]                         FACILITY:  [facility]  PHYSICIAN:  [name], M.D.    DATE OF BIRTH:  04-29-1960  DATE OF PROCEDURE:  05/03/2012 DATE OF DISCHARGE:                              OPERATIVE REPORT   PREOPERATIVE DIAGNOSES:  Diabetes, peripheral arterial disease, and right great toe gangrene.  POSTOPERATIVE DIAGNOSES:  Diabetes, peripheral arterial disease, and right great toe gangrene.  PROCEDURE:  Right foot first ray amputation.  SURGEON:  Abdou C. Ophelia Charter, M.D.  ANESTHESIA:  IV sedation with preoperative ankle block or popliteal block.  TOURNIQUET:  Esmarch.  TOURNIQUET TIME:  Less than 30 minutes.  PROCEDURE:  After induction of general anesthesia, Zosyn was stripping and the patient is on vancomycin and Zosyn.  Prepping and draping was performed with DuraPrep.  Esmarch tourniquet was used after time-out procedure was completed.  The patient had an arteriogram early in a day, which showed significant vessel disease, but no areas that were malleable to bypass or stenting per Vascular Surgery.  He did appear to have some flow through the posterior tib and this was calcified on plain radiographs with patient's 30+ years of diabetes.  We discussed with him extensively before the surgery that there was a chance that might be 50:50 that the first ray amputation might not healing, might have to have below-knee amputation.  A skin marker was used for incision along the lateral aspect of the first metatarsal.  Racquet handle incision was made around the base of the toe at the demarcation line.  There was small amount of purulence at the plantar surface.  Flexor extensor tendons were cut in line to retract.  Bovie was used for division of some tissue.  Oscillating saw was used for oblique cut starting proximal, medial to distal lateral, and bone edges were smoothed.   Some additional subcutaneous tissue was trimmed, some skin edges were trimmed.  Thick callus was peeled off the edges of the skin until it was peeling the epidermis down to pink dermis.  Esmarch was then removed.  There were some small arterial bleeders, which were partially calcified.  He did appear to have some skin edge bleeding.  After copious irrigation, the skin was approximated with horizontal mattress interrupted 2-0 nylon sutures.  Multiple sutures were placed.  Skin edges were trimmed for nice fit.  There was good capillary refill of the skin.  Xeroform, 4x4s, Webril, ABD, and Ace wrap were applied for postoperative dressing.     Alik C. Ophelia Charter, M.D.     MCY/MEDQ  D:  05/03/2012  T:  05/04/2012  Job:  [id number]

## 2012-05-04 NOTE — Progress Notes (Signed)
ANTIBIOTIC CONSULT NOTE - Follow Up  Pharmacy Consult for vancomycin + zosyn Indication: gangrene of the toe  No Known Allergies  Patient Measurements: Height: 5' 4.96" (165 cm) Weight: 192 lb 14.4 oz (87.5 kg) IBW/kg (Calculated) : 61.41 Adjusted Body Weight:   Vital Signs: Temp: 99 F (37.2 C) (03/27 0800) Temp src: Oral (03/27 0800) BP: 123/55 mmHg (03/27 1025) Pulse Rate: 77 (03/27 1025) Intake/Output from previous day: 03/26 0701 - 03/27 0700 In: 1135.8 [I.V.:885.8; IV Piggyback:250] Out: 200 [Stool:200]   Labs:  Recent Labs  05/03/12 0610 05/03/12 2107 05/04/12 0455  WBC 7.0 8.3 6.8  HGB 9.9* 9.9* 9.0*  PLT 237 219 206  CREATININE 1.45* 1.63* 1.77*   Estimated Creatinine Clearance: 50.1 ml/min (by C-G formula based on Cr of 1.77).  Microbiology: Recent Results (from the past 720 hour(s))  SURGICAL PCR SCREEN     Status: Abnormal   Collection Time    05/03/12  5:19 AM      Result Value Range Status   MRSA, PCR NEGATIVE  NEGATIVE Final   Staphylococcus aureus POSITIVE (*) NEGATIVE Final   Comment:            The Xpert SA Assay (FDA     approved for NASAL specimens     in patients over 60 years of age),     is one component of     a comprehensive surveillance     program.  Test performance has     been validated by [employer] for patients greater     than or equal to 46 year old.     It is not intended     to diagnose infection nor to     guide or monitor treatment.    Medical History: Past Medical History  Diagnosis Date  . Diabetes mellitus   . Hypertension   . Chronic kidney disease    Medications:  Anti-infectives   Start     Dose/Rate Route Frequency Ordered Stop   05/03/12 2045  cefUROXime (ZINACEF) 1.5 g in dextrose 5 % 50 mL IVPB  Status:  Discontinued     1.5 g 100 mL/hr over 30 Minutes Intravenous Every 12 hours 05/03/12 2040 05/03/12 2053   05/03/12 0530  piperacillin-tazobactam (ZOSYN) IVPB 3.375 g     3.375 g 12.5 mL/hr  over 240 Minutes Intravenous Every 8 hours 05/02/12 2111     05/02/12 2130  piperacillin-tazobactam (ZOSYN) IVPB 3.375 g     3.375 g 100 mL/hr over 30 Minutes Intravenous  Once 05/02/12 2111 05/02/12 2228   05/02/12 2130  vancomycin (VANCOCIN) IVPB 1000 mg/200 mL premix     1,000 mg 200 mL/hr over 60 Minutes Intravenous Every 12 hours 05/02/12 2111       Assessment: 52 yom s/p toe [name] without noted complications.  He was placed on empiric antibiotic coverage with Vancomycin and Zosyn.  Pt is currently afebrile and WBC is WNL. His creatinine has bumped a little higher today which may be contrast induced, however, he may also be accumulating his Vancomycin.  Goal of Therapy:  Vancomycin trough level 10-15 mcg/ml  Plan:  1. Check s/s Vancomycin trough to ensure appropriate clearance.   2. Zosyn 3.375gm IV Q8H 3. F/u renal fxn, C&S, clinical status   Nadara Mustard, PharmD., MS Clinical Pharmacist Pager:  [phone] Thank you for allowing pharmacy to be part of this patients care team. 05/04/2012,12:Lawrence PM

## 2012-05-04 NOTE — Evaluation (Signed)
Physical Therapy Evaluation Patient Details Name: Lawrence [name] MRN: [id number] DOB: 1960/03/15 Today's Date: 05/04/2012 Time: 4098-1191 PT Time Calculation (min): 18 min  PT Assessment / Plan / Recommendation Clinical Impression  Pt is a 52 y.o. male s/p toe amputation. Patient able to transfer to chair as per instructed using rw and heel shoe with min guard. Pt educated on elevation of LE to assist with swelling reduction. Will continue to see pt to increase activity as allowed in preparation for dc home.    PT Assessment  Patient needs continued PT services    Follow Up Recommendations  No PT follow up    Does the patient have the potential to tolerate intense rehabilitation      Barriers to Discharge Decreased caregiver support      Equipment Recommendations  Rolling walker with 5" wheels    Recommendations for Other Services     Frequency Min 3X/week    Precautions / Restrictions Precautions Precautions:  (R foot) Precaution Comments: R foot, must wear ortho boot during mobility Required Braces or Orthoses: Other Brace/Splint Other Brace/Splint: R boot, must be worn when ambulating/transferring Restrictions Other Position/Activity Restrictions: OOB with therapy, on initial visit, only 10 minutes of activity allowed         Mobility  Bed Mobility Bed Mobility: Not assessed Sitting - Scoot to Edge of Bed: 7: Independent Details for Bed Mobility Assistance: pt recieved sitting halfway on EOB Transfers Transfers: Sit to Stand;Stand to Sit Sit to Stand: 4: Min guard;From bed Stand to Sit: 4: Min guard;To chair/3-in-1 Details for Transfer Assistance: VC's for hand placement and sequencing with use of rw Ambulation/Gait Ambulation/Gait Assistance: 4: Min guard Ambulation Distance (Feet): 5 Feet (to chair per order restrictions) Assistive device: Rolling walker Ambulation/Gait Assistance Details: Pt with VC's for caution with RLE Gait velocity: decreased Stairs:  No    Exercises     PT Diagnosis: Difficulty walking;Acute pain  PT Problem List: Decreased activity tolerance;Decreased balance;Decreased mobility;Decreased knowledge of use of DME;Pain PT Treatment Interventions: DME instruction;Gait training;Functional mobility training;Therapeutic activities;Therapeutic exercise;Patient/family education   PT Goals Acute Rehab PT Goals PT Goal Formulation: With patient Time For Goal Achievement: 05/11/12 Potential to Achieve Goals: Good Pt will go Sit to Stand: with modified independence PT Goal: Sit to Stand - Progress: Goal set today Pt will go Stand to Sit: with modified independence PT Goal: Stand to Sit - Progress: Goal set today Pt will Ambulate: with modified independence PT Goal: Ambulate - Progress: Goal set today  Visit Information  Last PT Received On: 05/04/12 Assistance Needed: +1    Subjective Data  Subjective: My sister is coming to give me a bath Patient Stated Goal: to go home   Prior Functioning  Home Living Lives With: Alone Type of Home: House Home Access: Level entry Home Layout: One level Bathroom Shower/Tub: Engineer, manufacturing systems: Standard Home Adaptive Equipment: None Prior Function Level of Independence: Independent Able to Take Stairs?: Yes Driving: Yes Communication Communication: No difficulties Dominant Hand: Left    Cognition  Cognition Overall Cognitive Status: No family/caregiver present to determine baseline cognitive functioning Arousal/Alertness: Awake/alert Orientation Level: Appears intact for tasks assessed Behavior During Session: [name] for tasks performed Cognition - Other Comments: Patient with some questionable responses, unsure of baseline cognition.     Extremity/Trunk Assessment Right Upper Extremity Assessment RUE ROM/Strength/Tone: [facility] for tasks assessed Left Upper Extremity Assessment LUE ROM/Strength/Tone: [facility] for tasks assessed Right Lower Extremity Assessment RLE  ROM/Strength/Tone: [facility] for tasks  assessed (did not assess ankle ROM) Left Lower Extremity Assessment LLE ROM/Strength/Tone: [facility] for tasks assessed   Balance Balance Balance Assessed: No  End of Session PT - End of Session Equipment Utilized During Treatment: Gait belt Activity Tolerance: Patient tolerated treatment well Patient left: in chair;with call bell/phone within reach Nurse Communication: Mobility status;Other (comment)  GP     Fabio Asa 05/04/2012, 4:55 PM Charlotte Crumb, PT DPT  [phone]

## 2012-05-04 NOTE — Evaluation (Signed)
Occupational Therapy Evaluation Patient Details Name: Lawrence [name] MRN: [id number] DOB: [birth year] Today's Date: 05/04/2012 Time: 9562-1308 OT Time Calculation (min): 13 min  OT Assessment / Plan / Recommendation Clinical Impression  Pt demos decline in function and safety with LB ADLs and ADL mobility following R toe amputation. Pt would benefit from OT services to address these impairments to help restore PLOF to return home safely    OT Assessment  Patient needs continued OT Services    Follow Up Recommendations  No OT follow up;Supervision - Intermittent    Barriers to Discharge   Uncertain of amount of support/assist pt will have at this time  Equipment Recommendations       Recommendations for Other Services    Frequency  Min 2X/week    Precautions / Restrictions Precautions Precautions:  (R foot) Precaution Comments: R foot, must wear ortho boot during mobility Required Braces or Orthoses: Other Brace/Splint Other Brace/Splint: R boot, must be worn when ambulating/transferring Restrictions Other Position/Activity Restrictions: OOB with therapy, on initial visit, only 10 minutes of activity allowed   Pertinent Vitals/Pain     ADL  Grooming: Performed;Wash/dry hands;Wash/dry face;Set up Where Assessed - Grooming: Unsupported sitting Upper Body Bathing: Simulated;Set up;Other (comment) (pt stated that he would not allow strangers to assist ADLs) Lower Body Bathing: Simulated;Min guard Upper Body Dressing: Simulated;Set up Lower Body Dressing: Simulated;Min guard Toilet Transfer: Simulated;Min Pension scheme manager Method: Sit to stand Toileting - Architect and Hygiene: Min guard Where Assessed - Toileting Clothing Manipulation and Hygiene: Standing ADL Comments: pt stated that his sister was coming shortly to help him with bathing and that he does not like strangers helping him with bathing or dressing    OT Diagnosis: Generalized weakness;Acute pain   OT Problem List: Decreased knowledge of use of DME or AE;Impaired balance (sitting and/or standing);Pain;Decreased knowledge of precautions;Decreased safety awareness OT Treatment Interventions: Self-care/ADL training;Balance training;Therapeutic exercise;Neuromuscular education;Therapeutic activities;DME and/or AE instruction;Patient/family education   OT Goals Acute Rehab OT Goals OT Goal Formulation: With patient Time For Goal Achievement: 05/11/12 Potential to Achieve Goals: Good ADL Goals Pt Will Perform Grooming: with set-up;with supervision;Standing at sink ADL Goal: Grooming - Progress: Goal set today Pt Will Perform Lower Body Bathing: with set-up;with supervision ADL Goal: Lower Body Bathing - Progress: Goal set today Pt Will Perform Lower Body Dressing: with set-up;with supervision ADL Goal: Lower Body Dressing - Progress: Goal set today Pt Will Transfer to Toilet: with supervision;Grab bars ADL Goal: Toilet Transfer - Progress: Goal set today Pt Will Perform Toileting - Clothing Manipulation: with modified independence;with supervision Pt Will Perform Toileting - Hygiene: with modified independence;with supervision ADL Goal: Toileting - Hygiene - Progress: Goal set today  Visit Information  Last OT Received On: 05/04/12 Assistance Needed: +1 PT/OT Co-Evaluation/Treatment: Yes    Subjective Data  Subjective: " I am waiting for my sister to get here to help me with a bath, I don't let strangers help me with that " Patient Stated Goal: To return home   Prior Functioning     Home Living Lives With: Alone Type of Home: House Home Access: Level entry Home Layout: One level Bathroom Shower/Tub: Engineer, manufacturing systems: Standard Home Adaptive Equipment: None Prior Function Level of Independence: Independent Able to Take Stairs?: Yes Driving: Yes Communication Communication: No difficulties Dominant Hand: Left         Vision/Perception Vision -  History Baseline Vision: Wears glasses only for reading Patient Visual Report: No change from baseline Perception  Perception: Within Functional Limits   Cognition  Cognition Overall Cognitive Status: No family/caregiver present to determine baseline cognitive functioning Arousal/Alertness: Awake/alert Orientation Level: Appears intact for tasks assessed Behavior During Session: [facility] for tasks performed Cognition - Other Comments: Patient with some questionable responses, unsure of baseline cognition.     Extremity/Trunk Assessment Right Upper Extremity Assessment RUE ROM/Strength/Tone: [facility] for tasks assessed Left Upper Extremity Assessment LUE ROM/Strength/Tone: WFL for tasks assessed Right Lower Extremity Assessment RLE ROM/Strength/Tone: WFL for tasks assessed (did not assess ankle ROM) Left Lower Extremity Assessment LLE ROM/Strength/Tone: WFL for tasks assessed     Mobility Bed Mobility Bed Mobility: Not assessed Sitting - Scoot to Edge of Bed: 7: Independent Details for Bed Mobility Assistance: pt recieved sitting halfway on EOB Transfers Transfers: Sit to Stand;Stand to Sit Sit to Stand: 4: Min guard;From bed Stand to Sit: 4: Min guard;To chair/3-in-1 Details for Transfer Assistance: VC's for hand placement and sequencing with use of rw     Exercise     Balance Balance Balance Assessed: No   End of Session OT - End of Session Equipment Utilized During Treatment: Gait belt (RW) Activity Tolerance: Patient tolerated treatment well Patient left: in chair;with call bell/phone within reach  GO     Galen Manila 05/04/2012, 4:53 PM

## 2012-05-04 NOTE — Progress Notes (Signed)
PGY-1 Daily Progress Note Family Medicine Teaching Service Service Pager: [phone]   Subjective: patient had right 1st ray amputation last night and has tolerated this well.   Objective:  VITALS Temp:  [97.3 F (36.3 C)-98.9 F (37.2 C)] 98.2 F (36.8 C) (03/27 0620) Pulse Rate:  [72-93] 78 (03/27 0620) Resp:  [15-25] 16 (03/27 0620) BP: (96-163)/(55-68) 109/65 mmHg (03/27 0620) SpO2:  [97 %-99 %] 99 % (03/27 0620)  In/Out  Intake/Output Summary (Last 24 hours) at 05/04/12 0725 Last data filed at 05/04/12 0553  Gross per 24 hour  Intake 1135.83 ml  Output    200 ml  Net 935.83 ml    Physical Exam: Gen:  NAD CV: Regular rate and rhythm, no murmurs rubs or gallops PULM: clear to auscultation bilaterally. No wheezes/rales/rhonchi EXT: No edema. Right foot wrapped with dressing revealing no drainage Neuro: Alert and oriented x3  MEDS Scheduled Meds: . aspirin  81 mg Oral Daily  . atorvastatin  40 mg Oral q1800  . enoxaparin (LOVENOX) injection  30 mg Subcutaneous Q24H  . insulin aspart  0-15 Units Subcutaneous TID WC  . insulin aspart  0-5 Units Subcutaneous QHS  . insulin glargine  10 Units Subcutaneous Daily  . metoprolol tartrate  25 mg Oral BID  . pantoprazole  40 mg Oral Daily  . piperacillin-tazobactam (ZOSYN)  IV  3.375 g Intravenous Q8H  . vancomycin  1,000 mg Intravenous Q12H   Continuous Infusions: . sodium chloride 50 mL/hr at 05/03/12 2210   PRN Meds:.acetaminophen, acetaminophen, acetaminophen, acetaminophen, guaiFENesin-dextromethorphan, hydrALAZINE, HYDROcodone-acetaminophen, labetalol, metoCLOPramide (REGLAN) injection, metoCLOPramide, metoprolol, morphine injection, ondansetron (ZOFRAN) IV, ondansetron, phenol, traZODone  Labs and imaging:   CBC  Recent Labs Lab 05/03/12 0610 05/03/12 2107 05/04/12 0455  WBC 7.0 8.3 6.8  HGB 9.9* 9.9* 9.0*  HCT 29.4* 29.8* 27.2*  PLT 237 219 206   BMET/CMET  Recent Labs Lab 05/02/12 1914  05/03/12 0610 05/03/12 2107 05/04/12 0455  NA 138 140  --  140  K 4.0 4.2  --  3.9  CL 102 104  --  106  CO2 25 25  --  25  BUN 32* 35*  --  32*  CREATININE 1.23 1.45* 1.63* 1.77*  CALCIUM 10.0 9.2  --  8.7  GLUCOSE 93 196*  --  141*   Results for orders placed during the hospital encounter of 05/02/12 (from the past 24 hour(s))  GLUCOSE, CAPILLARY     Status: Abnormal   Collection Time    05/03/12  8:05 AM      Result Value Range   Glucose-Capillary 202 (*) 70 - 99 mg/dL  GLUCOSE, CAPILLARY     Status: Abnormal   Collection Time    05/03/12 11:35 AM      Result Value Range   Glucose-Capillary 141 (*) 70 - 99 mg/dL  GLUCOSE, CAPILLARY     Status: Abnormal   Collection Time    05/03/12  4:05 PM      Result Value Range   Glucose-Capillary 117 (*) 70 - 99 mg/dL  GLUCOSE, CAPILLARY     Status: Abnormal   Collection Time    05/03/12  6:00 PM      Result Value Range   Glucose-Capillary 108 (*) 70 - 99 mg/dL  GLUCOSE, CAPILLARY     Status: Abnormal   Collection Time    05/03/12  7:38 PM      Result Value Range   Glucose-Capillary 117 (*) 70 - 99 mg/dL  Comment 1 Notify RN    CBC     Status: Abnormal   Collection Time    05/03/12  9:07 PM      Result Value Range   WBC 8.3  4.0 - 10.5 K/uL   RBC 3.71 (*) 4.22 - 5.81 MIL/uL   Hemoglobin 9.9 (*) 13.0 - 17.0 g/dL   HCT 16.1 (*) 09.6 - 04.5 %   MCV 80.3  78.0 - 100.0 fL   MCH 26.7  26.0 - 34.0 pg   MCHC 33.2  30.0 - 36.0 g/dL   RDW 40.9  81.1 - 91.4 %   Platelets 219  150 - 400 K/uL  CREATININE, SERUM     Status: Abnormal   Collection Time    05/03/12  9:07 PM      Result Value Range   Creatinine, Ser 1.63 (*) 0.50 - 1.35 mg/dL   GFR calc non Af Amer 47 (*) >90 mL/min   GFR calc Af Amer 55 (*) >90 mL/min  GLUCOSE, CAPILLARY     Status: Abnormal   Collection Time    05/03/12  9:43 PM      Result Value Range   Glucose-Capillary 103 (*) 70 - 99 mg/dL   Comment 1 Documented in Chart     Comment 2 Notify RN    BASIC  METABOLIC PANEL     Status: Abnormal   Collection Time    05/04/12  4:55 AM      Result Value Range   Sodium 140  135 - 145 mEq/L   Potassium 3.9  3.5 - 5.1 mEq/L   Chloride 106  96 - 112 mEq/L   CO2 25  19 - 32 mEq/L   Glucose, Bld 141 (*) 70 - 99 mg/dL   BUN 32 (*) 6 - 23 mg/dL   Creatinine, Ser 7.82 (*) 0.50 - 1.35 mg/dL   Calcium 8.7  8.4 - 95.6 mg/dL   GFR calc non Af Amer 43 (*) >90 mL/min   GFR calc Af Amer 50 (*) >90 mL/min  CBC     Status: Abnormal   Collection Time    05/04/12  4:55 AM      Result Value Range   WBC 6.8  4.0 - 10.5 K/uL   RBC 3.37 (*) 4.22 - 5.81 MIL/uL   Hemoglobin 9.0 (*) 13.0 - 17.0 g/dL   HCT 21.3 (*) 08.6 - 57.8 %   MCV 80.7  78.0 - 100.0 fL   MCH 26.7  26.0 - 34.0 pg   MCHC 33.1  30.0 - 36.0 g/dL   RDW 46.9  62.9 - 52.8 %   Platelets 206  150 - 400 K/uL   X-ray Chest Pa And Lateral   05/02/2012  *RADIOLOGY REPORT*  Clinical Data: Preop for toe amputation.  CHEST - 2 VIEW  Comparison: 02/22/2012.  Findings: The heart is borderline enlarged but stable.  The mediastinal and hilar contours are prominent but unchanged.  Low lung volumes with vascular crowding and atelectasis but no infiltrates or effusions.  The bony thorax is intact.  IMPRESSION: No acute cardiopulmonary findings.   Original Report Authenticated By: Rudie Meyer, M.D.    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 year old male presenting with dry gangrene of right great toe.   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 Evaluation by Vascular surgery given significant calcifications seen on foot xray. LE Arterial Duplex on 3/21--RLE monophasic w/ ABI of 1.21; LLE WNL - Aortogram per above-vascular states will see how patient does with amputation healing prior to  determination of need for BKA - Empiric antibiotics - Vanc/Zosyn per pharmacy. Will continue until tomorrow to give coverage in the post-op period - Pain control - Vicodin 5-325 mg, 1-2 tablets Q4 PRN, morphine prn - PT/OT to eval  # DM-2, uncontrolled  - SSI, moderate  - holding home glipizide. 1/2 home lantus.  - CBG checks TID AC and QHS  - Diabetes education during admission  - A1c 11.9  # HTN  - Will continue Lopressor 25 mg BID, Lopressor 5 mg IV Q2PRN for SBP 160  - Will add addition agents as needed.   # HLD  - Starting Lipitor 40 mg.   # CKD, stage 1  - Will monitor BUN/Creatinine closely during admission.  - 3/27 Cr bump from 1.23 to 1.45 --> 1.63-->1.77--likely pre-renal. Likely pre-renal, will restart fluids.   # Anemia: likely related to blood loss from surgery -will continue to follow with CBCs  FEN/GI: Carb modified diet.    Prophylaxis: Heparin SQ, PPI  Disposition: discharge pending good response to surgery and PT/OT eval  Code Status: Full code  Marikay Alar, MD 05/04/2012, 7:25 AM

## 2012-05-04 NOTE — Progress Notes (Signed)
[facility]  Subjective  - Some pain right foot   Objective 134/68 79 99 F (37.2 C) (Oral) 20 99%  Intake/Output Summary (Last 24 hours) at 05/04/12 0816 Last data filed at 05/04/12 0553  Gross per 24 hour  Intake 1135.83 ml  Output    200 ml  Net 935.83 ml   Left groin no hematoma, dressing dry left foot warm  Assessment/Planning: S/p right first toe amp.  Has tibial disease right leg but no real reconstruction that would help heal his toe amp as he has essentially in line flow to the foot except one short 2 mm segment of his PT.  He has significant small vessel disease in the foot and also evidence of small vessel disease in the distal renal arteries.  Mainstay of therapy is going to be diabetes management.  Will sign off.  Pt can follow up with me PRN  Creatinine 1.8 today.  Would continue to hydrate gently may be some element of contrast nephropathy hopefully will resolve and trend back to baseline next 1-2 days.  FIELDS,CHARLES E 05/04/2012 8:16 AM --  Laboratory Lab Results:  Recent Labs  05/03/12 2107 05/04/12 0455  WBC 8.3 6.8  HGB 9.9* 9.0*  HCT 29.8* 27.2*  PLT 219 206   BMET  Recent Labs  05/03/12 0610 05/03/12 2107 05/04/12 0455  NA 140  --  140  K 4.2  --  3.9  CL 104  --  106  CO2 25  --  25  GLUCOSE 196*  --  141*  BUN 35*  --  32*  CREATININE 1.45* 1.63* 1.77*  CALCIUM 9.2  --  8.7    COAG No results found for this basename: INR, PROTIME   No results found for this basename: PTT

## 2012-05-05 LAB — BASIC METABOLIC PANEL
BUN: 34 mg/dL — ABNORMAL HIGH (ref 6–23)
CO2: 25 mEq/L (ref 19–32)
Glucose, Bld: 198 mg/dL — ABNORMAL HIGH (ref 70–99)
Potassium: 4.2 mEq/L (ref 3.5–5.1)
Sodium: 138 mEq/L (ref 135–145)

## 2012-05-05 LAB — GLUCOSE, CAPILLARY
Glucose-Capillary: 178 mg/dL — ABNORMAL HIGH (ref 70–99)
Glucose-Capillary: 194 mg/dL — ABNORMAL HIGH (ref 70–99)
Glucose-Capillary: 188 mg/dL — ABNORMAL HIGH (ref 70–99)

## 2012-05-05 NOTE — Discharge Summary (Signed)
Physician Discharge Summary  Patient ID: Lawrence [name] MRN: [id number] DOB: 1960-07-18 Age: 52 y.o.  Admit date: 05/02/2012 Discharge date: 05/07/2012 Admitting Physician: Nestor Ramp, MD  PCP: [name], MD  Consultants: Orthopedic surgery Vascular surgery     Discharge Diagnosis: Principal Problem:   Gangrene of toe Active Problems:   DM (diabetes mellitus), type 2, uncontrolled   HYPERCHOLESTEROLEMIA   Essential hypertension, benign   Chronic kidney disease (CKD), stage I    Hospital Course Patient was admitted with dry gangrene of toe 2/2 uncontrolled diabetes (A1C 11.9), peripheral vascular disease, and microvascular disease.  Empiric vancomycin and Zosyn initiated for infected toe.  In consult with orthopedics and vascular surgery, aortogram of RLE was ordered to gauge blood flow to dead toe following ABI that showed monophasic flow and xray showing calcified runoff vessels in RLE.  Patient was hydrated prior to aortogram due to CKD and elevated creatinine of 1.4. Flow was deemed adequate to proceed with ray amputation. Creatinine continued to climb post procedure (Peak 2.3), likely due to contrast administration and possibly high vancomycin trough (33). Antibiotics discontinued HD3. Fluids continued with strict monitoring of I/Os. Statin initiated. Pt stable at time of d/c and was to follow up with ortho 1 week after d/c to evaluate toe amputation as well possible BKA.  Lastly, DM II,uncontrolled, his metformin was d/c upon discharge due to elevated creatinine, and he was started on basal insulin.    Problem List 1. Toe amputation 2. DM 2, uncontrolled 3.  HLD 4. HTN 5. CKD stage 1        Discharge PE   Filed Vitals:   05/07/12 0603  BP: 176/73  Pulse: 78  Temp: 98.4 F (36.9 C)  Resp: 18   Gen: NAD, sitting up in chair with legs elevated  CV: Regular rate and rhythm, no murmurs rubs or gallops  PULM: clear to auscultation bilaterally. No wheezes/rales/rhonchi   EXT: No edema. Right foot wrapped with dressing revealing no drainage. Post-op boot in place. 2+ edema of RLE but able to move toes with no problem.  Neuro: Alert and oriented x3      Procedures/Imaging:  X-ray Chest Pa And Lateral   05/02/2012  IMPRESSION: No acute cardiopulmonary findings.   Original Report Authenticated By: Rudie Meyer, M.D.    Dg Foot Complete Right  04/18/2012   IMPRESSION: Soft tissue defect with overlying bandage the first toe.  No underlying osseous destruction.  If persistent clinical concern of osteomyelitis, pre and post contrast dedicated MRI should be considered.   Original Report Authenticated By: Jeronimo Greaves, M.D.     Labs  CBC  Recent Labs Lab 05/03/12 0610 05/03/12 2107 05/04/12 0455  WBC 7.0 8.3 6.8  HGB 9.9* 9.9* 9.0*  HCT 29.4* 29.8* 27.2*  PLT 237 219 206   BMET  Recent Labs Lab 05/03/12 0610 05/03/12 2107 05/04/12 0455 05/05/12 0605  NA 140  --  140 138  K 4.2  --  3.9 4.2  CL 104  --  106 103  CO2 25  --  25 25  BUN 35*  --  32* 34*  CREATININE 1.45* 1.63* 1.77* 2.24*  CALCIUM 9.2  --  8.7 8.5  GLUCOSE 196*  --  141* 198*   HEMOGLOBIN A1C     Status: Abnormal   Collection Time    05/02/12  7:14 PM      Result Value Range   Hemoglobin A1C 11.9 (*) <5.7 %  SURGICAL PCR  SCREEN     Status: Abnormal   Collection Time    05/03/12  5:19 AM      Result Value Range   MRSA, PCR NEGATIVE  NEGATIVE   Staphylococcus aureus POSITIVE (*) NEGATIVE       Patient condition at time of discharge/disposition: stable  Disposition-home   Follow up issues: 1. PT/OT, follow up ortho recs about possible BKA s/p ray amputation  2. DM 2, uncontrolled - A1C 11.9, started on Lantus upon d/c but may need something cheaper if not able to afford long term.   3. Acute on chronic kidney disease 4. Medication expense  Discharge follow up:   Discharge Orders   Future Appointments Provider Department Dept Phone   06/13/2012 8:30 AM [name], MD [facility] [phone]   Future Orders Complete By Expires     Call MD for:  persistant dizziness or light-headedness  As directed     Call MD for:  persistant nausea and vomiting  As directed     Call MD for:  severe uncontrolled pain  As directed         Discharge Instructions: Please refer to Patient Instructions section of EMR for full details.  Patient was counseled important signs and symptoms that should prompt return to medical care, changes in medications, dietary instructions, activity restrictions, and follow up appointments.  Significant instructions noted below:    Discharge Medications   Medication List    STOP taking these medications       metFORMIN 850 MG tablet  Commonly known as:  GLUCOPHAGE      TAKE these medications       aspirin 81 MG tablet  Take 81 mg by mouth daily.     atorvastatin 40 MG tablet  Commonly known as:  LIPITOR  Take 1 tablet (40 mg total) by mouth daily at 6 PM.     ferrous sulfate 325 (65 FE) MG tablet  Take 1 tablet (325 mg total) by mouth 3 (three) times daily with meals.     glipiZIDE 10 MG 24 hr tablet  Commonly known as:  GLUCOTROL XL  Take 1 tablet (10 mg total) by mouth 2 (two) times daily.     HYDROcodone-acetaminophen 5-325 MG per tablet  Commonly known as:  NORCO/VICODIN  Take 1-2 tablets by mouth every 4 (four) hours as needed.     insulin glargine 100 UNIT/ML injection  Commonly known as:  LANTUS  Inject 20 Units into the skin at bedtime.     metoprolol tartrate 25 MG tablet  Commonly known as:  LOPRESSOR  Take 1 tablet (25 mg total) by mouth 2 (two) times daily.     omeprazole 20 MG capsule  Commonly known as:  PRILOSEC  Take 1 capsule (20 mg total) by mouth daily.         Twana First Laurieann Friddle, DO of [facility] 05/07/2012, 10:10 AM

## 2012-05-05 NOTE — Progress Notes (Signed)
PGY-1 Daily Progress Note Family Medicine Teaching Service Service Pager: [phone]   Subjective: pain overnight, partially relieved with morphine. Urinating 3 x last 12 hours. Appetite good.   Objective:  VITALS Temp:  [98.7 F (37.1 C)-99.2 F (37.3 C)] 98.7 F (37.1 C) (03/28 0526) Pulse Rate:  [71-77] 71 (03/28 0526) Resp:  [16-20] 16 (03/28 0526) BP: (118-138)/(49-61) 123/49 mmHg (03/28 0526) SpO2:  [99 %-100 %] 100 % (03/28 0526)  In/Out  Intake/Output Summary (Last 24 hours) at 05/05/12 0817 Last data filed at 05/05/12 0600  Gross per 24 hour  Intake 1843.33 ml  Output    300 ml  Net 1543.33 ml    Physical Exam: Gen:  NAD CV: Regular rate and rhythm, no murmurs rubs or gallops PULM: clear to auscultation bilaterally. No wheezes/rales/rhonchi EXT: No edema. Right foot wrapped with dressing revealing no drainage Neuro: Alert and oriented x3 Skin: Left femoral site healing well, no drainage or induration.   MEDS Scheduled Meds: . aspirin  81 mg Oral Daily  . atorvastatin  40 mg Oral q1800  . enoxaparin (LOVENOX) injection  30 mg Subcutaneous Q24H  . insulin aspart  0-15 Units Subcutaneous TID WC  . insulin aspart  0-5 Units Subcutaneous QHS  . insulin glargine  10 Units Subcutaneous Daily  . metoprolol tartrate  25 mg Oral BID  . pantoprazole  40 mg Oral Daily  . piperacillin-tazobactam (ZOSYN)  IV  3.375 g Intravenous Q8H   Continuous Infusions: . sodium chloride 100 mL/hr at 05/04/12 1624   PRN Meds:.acetaminophen, acetaminophen, acetaminophen, acetaminophen, guaiFENesin-dextromethorphan, hydrALAZINE, HYDROcodone-acetaminophen, labetalol, metoCLOPramide (REGLAN) injection, metoCLOPramide, metoprolol, morphine injection, ondansetron (ZOFRAN) IV, ondansetron, phenol, traZODone  Labs and imaging:   CBC  Recent Labs Lab 05/03/12 0610 05/03/12 2107 05/04/12 0455  WBC 7.0 8.3 6.8  HGB 9.9* 9.9* 9.0*  HCT 29.4* 29.8* 27.2*  PLT 237 219 206    BMET/CMET  Recent Labs Lab 05/03/12 0610 05/03/12 2107 05/04/12 0455 05/05/12 0605  NA 140  --  140 138  K 4.2  --  3.9 4.2  CL 104  --  106 103  CO2 25  --  25 25  BUN 35*  --  32* 34*  CREATININE 1.45* 1.63* 1.77* 2.24*  CALCIUM 9.2  --  8.7 8.5  GLUCOSE 196*  --  141* 198*   Results for orders placed during the hospital encounter of 05/02/12 (from the past 24 hour(s))  GLUCOSE, CAPILLARY     Status: Abnormal   Collection Time    05/04/12 12:01 PM      Result Value Range   Glucose-Capillary 221 (*) 70 - 99 mg/dL  GLUCOSE, CAPILLARY     Status: Abnormal   Collection Time    05/04/12  5:17 PM      Result Value Range   Glucose-Capillary 192 (*) 70 - 99 mg/dL  VANCOMYCIN, TROUGH     Status: Abnormal   Collection Time    05/04/12  7:31 PM      Result Value Range   Vancomycin Tr 33.7 (*) 10.0 - 20.0 ug/mL  GLUCOSE, CAPILLARY     Status: Abnormal   Collection Time    05/04/12  9:06 PM      Result Value Range   Glucose-Capillary 161 (*) 70 - 99 mg/dL  BASIC METABOLIC PANEL     Status: Abnormal   Collection Time    05/05/12  6:05 AM      Result Value Range   Sodium 138  135 - 145 mEq/L   Potassium 4.2  3.5 - 5.1 mEq/L   Chloride 103  96 - 112 mEq/L   CO2 25  19 - 32 mEq/L   Glucose, Bld 198 (*) 70 - 99 mg/dL   BUN 34 (*) 6 - 23 mg/dL   Creatinine, Ser 4.09 (*) 0.50 - 1.35 mg/dL   Calcium 8.5  8.4 - 81.1 mg/dL   GFR calc non Af Amer 32 (*) >90 mL/min   GFR calc Af Amer 37 (*) >90 mL/min  GLUCOSE, CAPILLARY     Status: Abnormal   Collection Time    05/05/12  7:56 AM      Result Value Range   Glucose-Capillary 178 (*) 70 - 99 mg/dL   Comment 1 Notify RN     No results found. 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 year old male presenting with dry gangrene of right great toe.   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 Evaluation by Vascular surgery given significant calcifications seen on foot xray. LE Arterial Duplex on 3/21--RLE monophasic w/ ABI of 1.21; LLE WNL - Aortogram per above-vascular states will see how patient does with amputation healing prior to determination of need for BKA - Empiric antibiotics - Vanc/Zosyn per pharmacy. D/Cd vanc and zosyn 3/28 - Pain control - Vicodin 5-325 mg, 1-2 tablets Q4 PRN, morphine prn - PT/OT to eval  # DM-2, uncontrolled  - SSI, moderate  - holding home glipizide. 1/2 home lantus.  - CBG checks TID AC and QHS  - Diabetes education during admission  - A1c 11.9  # HTN  - Will continue Lopressor 25 mg BID, Lopressor 5 mg IV Q2PRN for SBP 160  - Will add addition agents as needed.   # HLD  - Starting Lipitor 40 mg.   # CKD, stage 1  - Will monitor BUN/Creatinine closely during admission.  - Cr bump from 1.23-->1.45 --> 1.63-->1.77-->2.24 on 3/28--initially pre-renal. After aortagram 3/26, bump due to contrast and possible high vancomycin level (33). D/Cd vanc, strict I/Os, IV fluids; monitor for resolution over next 2-4 days; urinating appropriately.   # Anemia: likely related to blood loss from surgery -will continue to follow with CBCs  FEN/GI: Carb modified diet.    Prophylaxis: Heparin SQ, PPI  Disposition: discharge pending good response to surgery and PT/OT eval  Code Status: Full code  Ellin Goodie, MS4  05/05/2012, 8:17 AM   PGY-2 Addendum: I have seen and evaluated patient and agree with MS4 note above. In brief, patient is now post-op day 2 from right great toe amputation due to gangrene. Patient is doing very well this morning. He states he did not sleep well due to his pain, but overall feeling better. Tolerating PO and good urine output.  O: Gen: Sitting up in chair with legs elevated HEENT: MMM Cardio: RRR Pulm: CTAB, good effort Abd: Obese,  nontender Ext: Post-op boot on right foot  A/P: - Pt's creatinine continues to increase. On IVF at 100cc/hr. Encourage PO intake and follow renal function daily. Likely due to contrast, but anticipate it to trend back down soon - Await vascular and ortho recommendations about discharge and/or use of antibiotics in this patient. Vanc d/c due to high through level and is  now s/p ambulation. - Continue to monitor CBG  Amber M. Hairford, M.D. 05/05/2012 9:56 AM

## 2012-05-05 NOTE — Progress Notes (Signed)
OT Cancellation Note  Patient Details Name: Lawrence [name] MRN: [id number] DOB: [birth year]   Cancelled Treatment:    Reason Eval/Treat Not Completed: Other (comment) (pt pleasantly declined due to fatigue). Pt stated that he did not get any sleep last night and was not up to even walking to the bathroom  Galen Manila 05/05/2012, 3:08 PM

## 2012-05-05 NOTE — Progress Notes (Signed)
Physical Therapy Treatment Patient Details Name: Lawrence [name] MRN: [id number] DOB: 1960/10/13 Today's Date: 05/05/2012 Time: 4098-1191 PT Time Calculation (min): 26 min  PT Assessment / Plan / Recommendation Comments on Treatment Session  Pt demonstrates significant improvements in mobility today and reports decreased pain. Anticipate pt will be safe for d/c home when medically able.  Spoke with patient regarding activities and expectations for mobility and rest at home. Advised to keep LE elevated to assist with edema control. Pt had no other concerns. Rec continued use of rw at home.     Follow Up Recommendations  No PT follow up     Does the patient have the potential to tolerate intense rehabilitation     Barriers to Discharge Decreased caregiver support      Equipment Recommendations  Rolling walker with 5" wheels    Recommendations for Other Services    Frequency Min 3X/week   Plan Discharge plan remains appropriate    Precautions / Restrictions Precautions Precautions: Other (comment) Precaution Comments: R foot, must wear ortho boot during mobility Required Braces or Orthoses: Other Brace/Splint Other Brace/Splint: R boot, must be worn when ambulating/transferring Restrictions Weight Bearing Restrictions: No   Pertinent Vitals/Pain 2/10 with ambulation, no pain at rest    Mobility  Bed Mobility Bed Mobility: Sitting - Scoot to Edge of Bed Sitting - Scoot to Edge of Bed: 7: Independent Details for Bed Mobility Assistance: pt recieved sitting halfway on EOB Transfers Transfers: Sit to Stand;Stand to Sit Sit to Stand: 6: Modified independent (Device/Increase time) Stand to Sit: 6: Modified independent (Device/Increase time) Details for Transfer Assistance: pt able to perform transfers much better today Ambulation/Gait Ambulation/Gait Assistance: 5: Supervision Ambulation Distance (Feet): 200 Feet Assistive device: Rolling walker Ambulation/Gait Assistance  Details: Pt very steady but extremely slow with mabultion today.  No assist or guard needed. Gait velocity: decreased Stairs: No    Exercises     PT Diagnosis: Difficulty walking;Acute pain  PT Problem List: Decreased activity tolerance;Decreased balance;Decreased mobility;Decreased knowledge of use of DME;Pain PT Treatment Interventions: DME instruction;Gait training;Functional mobility training;Therapeutic activities;Therapeutic exercise;Patient/family education   PT Goals Acute Rehab PT Goals PT Goal Formulation: With patient Time For Goal Achievement: 05/11/12 Potential to Achieve Goals: Good Pt will go Sit to Stand: with modified independence PT Goal: Sit to Stand - Progress: Progressing toward goal Pt will go Stand to Sit: with modified independence PT Goal: Stand to Sit - Progress: Progressing toward goal Pt will Ambulate: with modified independence PT Goal: Ambulate - Progress: Progressing toward goal  Visit Information  Last PT Received On: 05/05/12 Assistance Needed: +1    Subjective Data  Subjective: I feel much better today Patient Stated Goal: to go home   Cognition  Cognition Overall Cognitive Status: No family/caregiver present to determine baseline cognitive functioning Arousal/Alertness: Awake/alert Orientation Level: Appears intact for tasks assessed;Oriented X4 / Intact Behavior During Session: [facility] for tasks performed Cognition - Other Comments: Patient with some questionable responses, unsure of baseline cognition.     Balance   Good overall balance, steady with all activities  End of Session PT - End of Session Equipment Utilized During Treatment: Gait belt Activity Tolerance: Patient tolerated treatment well Patient left: in chair;with call bell/phone within reach Nurse Communication: Mobility status;Other (comment)   GP     Fabio Asa 05/05/2012, 1:07 PM Charlotte Crumb, PT DPT  [phone]

## 2012-05-05 NOTE — Progress Notes (Signed)
FMTS Attending Admission Note: Brooklyne Radke,MD I  have seen and examined this patient, reviewed their chart. I have discussed this patient with the resident. I agree with the resident's findings, assessment and care plan.  In addition,we will keep patient and monitor his creatinine to ensure it is trending down before d/c home.

## 2012-05-05 NOTE — Progress Notes (Addendum)
Patient ID: Lawrence [name], male   DOB: 31-Jan-1961, 52 y.o.   MRN: [id number] Dressing dry.  Can stop antibiotics.   Gangrene amputated part resected.    Creatinine still slowly climbing.   I would like to see him late next week in my office for dressing change.  Thank you. Office # [phone]    Consider holding BP meds to increase pressure for better renal perfusion.  Pain meds plus BP meds together may have decreased renal perfusion pressure.

## 2012-05-05 NOTE — Progress Notes (Signed)
ANTIBIOTIC CONSULT NOTE - Follow Up  Pharmacy Consult for vancomycin + zosyn Indication: gangrene of the toe  No Known Allergies  Patient Measurements: Height: 5' 4.96" (165 cm) Weight: 192 lb 14.4 oz (87.5 kg) IBW/kg (Calculated) : 61.41 Adjusted Body Weight:   Vital Signs: Temp: 98.7 F (37.1 C) (03/28 0526) Temp src: Oral (03/28 0526) BP: 123/49 mmHg (03/28 0526) Pulse Rate: 71 (03/28 0526) Intake/Output from previous day: 03/27 0701 - 03/28 0700 In: 1843.3 [I.V.:1593.3; IV Piggyback:250] Out: 300 [Stool:300]   Labs:  Recent Labs  05/03/12 0610 05/03/12 2107 05/04/12 0455 05/05/12 0605  WBC 7.0 8.3 6.8  --   HGB 9.9* 9.9* 9.0*  --   PLT 237 219 206  --   CREATININE 1.45* 1.63* 1.77* 2.24*   Estimated Creatinine Clearance: 39.6 ml/min (by C-G formula based on Cr of 2.24).  Microbiology: Recent Results (from the past 720 hour(s))  SURGICAL PCR SCREEN     Status: Abnormal   Collection Time    05/03/12  5:19 AM      Result Value Range Status   MRSA, PCR NEGATIVE  NEGATIVE Final   Staphylococcus aureus POSITIVE (*) NEGATIVE Final   Comment:            The Xpert SA Assay (FDA     approved for NASAL specimens     in patients over 57 years of age),     is one component of     a comprehensive surveillance     program.  Test performance has     been validated by [employer] for patients greater     than or equal to 92 year old.     It is not intended     to diagnose infection nor to     guide or monitor treatment.   Medical History: Past Medical History  Diagnosis Date  . Diabetes mellitus   . Hypertension   . Chronic kidney disease    Medications:  Anti-infectives   Start     Dose/Rate Route Frequency Ordered Stop   05/03/12 2045  cefUROXime (ZINACEF) 1.5 g in dextrose 5 % 50 mL IVPB  Status:  Discontinued     1.5 g 100 mL/hr over 30 Minutes Intravenous Every 12 hours 05/03/12 2040 05/03/12 2053   05/03/12 0530  piperacillin-tazobactam (ZOSYN)  IVPB 3.375 g     3.375 g 12.5 mL/hr over 240 Minutes Intravenous Every 8 hours 05/02/12 2111     05/02/12 2130  piperacillin-tazobactam (ZOSYN) IVPB 3.375 g     3.375 g 100 mL/hr over 30 Minutes Intravenous  Once 05/02/12 2111 05/02/12 2228   05/02/12 2130  vancomycin (VANCOCIN) IVPB 1000 mg/200 mL premix  Status:  Discontinued     1,000 mg 200 mL/hr over 60 Minutes Intravenous Every 12 hours 05/02/12 2111 05/04/12 2040     Assessment: 51 yom s/p toe amputation without noted complications.  He was placed on empiric antibiotic coverage with Vancomycin and Zosyn.  He has had continued renal insufficiency with Creatinine of 2.2 today and an estimated clearance ~ 40 ml/min.  He has a vancomycin trough drawn last night which came back above desired goal of 15-20 mcg/ml at 33.7 mcg/ml.  This reveals accumulation and Vancomycin has now been discontinued.  He continues on Zosyn and dose is appropriate for current renal function.  Goal of Therapy:  Clearance of infection with therapeutic response to IV antibiotics. Change to oral regimen (Doxycycline 100 mg bid)  in the next 24 hours.  Plan:  1. Continue Zosyn 3.375gm IV Q8H 3. F/u renal fxn, C&S, clinical status   Nadara Mustard, PharmD., MS Clinical Pharmacist Pager:  [phone] Thank you for allowing pharmacy to be part of this patients care team. 05/05/2012,10:47 AM

## 2012-05-06 DIAGNOSIS — D62 Acute posthemorrhagic anemia: Secondary | ICD-10-CM

## 2012-05-06 LAB — GLUCOSE, CAPILLARY
Glucose-Capillary: 165 mg/dL — ABNORMAL HIGH (ref 70–99)
Glucose-Capillary: 203 mg/dL — ABNORMAL HIGH (ref 70–99)
Glucose-Capillary: 147 mg/dL — ABNORMAL HIGH (ref 70–99)
Glucose-Capillary: 161 mg/dL — ABNORMAL HIGH (ref 70–99)

## 2012-05-06 LAB — BASIC METABOLIC PANEL
CO2: 23 mEq/L (ref 19–32)
Calcium: 8.6 mg/dL (ref 8.4–10.5)
Chloride: 104 mEq/L (ref 96–112)
Creatinine, Ser: 2.12 mg/dL — ABNORMAL HIGH (ref 0.50–1.35)
Glucose, Bld: 189 mg/dL — ABNORMAL HIGH (ref 70–99)
Sodium: 137 mEq/L (ref 135–145)

## 2012-05-06 LAB — CBC
HCT: 25.1 % — ABNORMAL LOW (ref 39.0–52.0)
MCH: 26.6 pg (ref 26.0–34.0)
MCV: 78.4 fL (ref 78.0–100.0)
Platelets: 197 10*3/uL (ref 150–400)
RBC: 3.2 MIL/uL — ABNORMAL LOW (ref 4.22–5.81)

## 2012-05-06 MED ORDER — ATORVASTATIN CALCIUM 40 MG PO TABS: 40.0000 mg | ORAL_TABLET | Freq: Every day | ORAL | Status: DC

## 2012-05-06 MED ORDER — FERROUS SULFATE 325 (65 FE) MG PO TABS
325.0000 mg | ORAL_TABLET | Freq: Three times a day (TID) | ORAL | Status: DC
  Administered 2012-05-06 – 2012-05-07 (×3): 325 mg via ORAL
  Filled 2012-05-06 (×6): qty 1

## 2012-05-06 MED ORDER — FERROUS SULFATE 325 (65 FE) MG PO TABS: 325.0000 mg | ORAL_TABLET | Freq: Three times a day (TID) | ORAL | Status: DC

## 2012-05-06 NOTE — Progress Notes (Signed)
Daily Progress Note Family Medicine Teaching Service Service Pager: [phone]   Subjective: Doing well this morning. Pain is better controlled. He is ambulating well with no problems. Eating ok, but has not had a BM yet (which he states is normal for him.)  Objective:  VITALS Temp:  [98.5 F (36.9 C)-98.9 F (37.2 C)] 98.5 F (36.9 C) (03/29 0524) Pulse Rate:  [60-75] 75 (03/29 0524) Resp:  [17-18] 17 (03/29 0524) BP: (139-152)/(63-67) 139/64 mmHg (03/29 0524) SpO2:  [98 %-100 %] 98 % (03/29 0524)  In/Out  Intake/Output Summary (Last 24 hours) at 05/06/12 0930 Last data filed at 05/06/12 0501  Gross per 24 hour  Intake 2381.67 ml  Output   1550 ml  Net 831.67 ml    Physical Exam: Gen:  NAD, sitting up in chair with legs elevated CV: Regular rate and rhythm, no murmurs rubs or gallops PULM: clear to auscultation bilaterally. No wheezes/rales/rhonchi EXT: No edema. Right foot wrapped with dressing revealing no drainage. Post-op boot in place. 2+ edema of RLE but able to move toes with no problem. Neuro: Alert and oriented x3  MEDS Scheduled Meds: . aspirin  81 mg Oral Daily  . atorvastatin  40 mg Oral q1800  . enoxaparin (LOVENOX) injection  30 mg Subcutaneous Q24H  . ferrous sulfate  325 mg Oral TID WC  . insulin aspart  0-15 Units Subcutaneous TID WC  . insulin aspart  0-5 Units Subcutaneous QHS  . insulin glargine  10 Units Subcutaneous Daily  . metoprolol tartrate  25 mg Oral BID  . pantoprazole  40 mg Oral Daily   Continuous Infusions: . sodium chloride 100 mL/hr at 05/06/12 0815   PRN Meds:.acetaminophen, acetaminophen, acetaminophen, acetaminophen, guaiFENesin-dextromethorphan, HYDROcodone-acetaminophen, metoCLOPramide (REGLAN) injection, metoCLOPramide, metoprolol, morphine injection, ondansetron (ZOFRAN) IV, ondansetron, phenol, traZODone  Labs and imaging:   CBC  Recent Labs Lab 05/03/12 2107 05/04/12 0455 05/06/12 0723  WBC 8.3 6.8 5.9  HGB 9.9*  9.0* 8.5*  HCT 29.8* 27.2* 25.1*  PLT 219 206 197   BMET/CMET  Recent Labs Lab 05/04/12 0455 05/05/12 0605 05/06/12 0723  NA 140 138 137  K 3.9 4.2 4.1  CL 106 103 104  CO2 25 25 23   BUN 32* 34* 32*  CREATININE 1.77* 2.24* 2.12*  CALCIUM 8.7 8.5 8.6  GLUCOSE 141* 198* 189*   Results for orders placed during the hospital encounter of 05/02/12 (from the past 24 hour(s))  GLUCOSE, CAPILLARY     Status: Abnormal   Collection Time    05/05/12 12:02 PM      Result Value Range   Glucose-Capillary 194 (*) 70 - 99 mg/dL   Comment 1 Notify RN    GLUCOSE, CAPILLARY     Status: Abnormal   Collection Time    05/05/12  5:04 PM      Result Value Range   Glucose-Capillary 147 (*) 70 - 99 mg/dL   Comment 1 Notify RN    GLUCOSE, CAPILLARY     Status: Abnormal   Collection Time    05/05/12  8:51 PM      Result Value Range   Glucose-Capillary 188 (*) 70 - 99 mg/dL  BASIC METABOLIC PANEL     Status: Abnormal   Collection Time    05/06/12  7:23 AM      Result Value Range   Sodium 137  135 - 145 mEq/L   Potassium 4.1  3.5 - 5.1 mEq/L   Chloride 104  96 - 112 mEq/L  CO2 23  19 - 32 mEq/L   Glucose, Bld 189 (*) 70 - 99 mg/dL   BUN 32 (*) 6 - 23 mg/dL   Creatinine, Ser 1.61 (*) 0.50 - 1.35 mg/dL   Calcium 8.6  8.4 - 09.6 mg/dL   GFR calc non Af Amer 34 (*) >90 mL/min   GFR calc Af Amer 40 (*) >90 mL/min  CBC     Status: Abnormal   Collection Time    05/06/12  7:23 AM      Result Value Range   WBC 5.9  4.0 - 10.5 K/uL   RBC 3.20 (*) 4.22 - 5.81 MIL/uL   Hemoglobin 8.5 (*) 13.0 - 17.0 g/dL   HCT 04.5 (*) 40.9 - 81.1 %   MCV 78.4  78.0 - 100.0 fL   MCH 26.6  26.0 - 34.0 pg   MCHC 33.9  30.0 - 36.0 g/dL   RDW 91.4  78.2 - 95.6 %   Platelets 197  150 - 400 K/uL  GLUCOSE, CAPILLARY     Status: Abnormal   Collection Time    05/06/12  8:02 AM      Result Value Range   Glucose-Capillary 165 (*) 70 - 99 mg/dL   Comment 1 Notify RN     No results found. Aortogram: 50% stenosis of  the right popliteal artery, 75% stenosis of a very short segment of right posterior tibial, occluded right anterior tibial and peroneal artery  Assessment/Plan Lawrence [name] is a 52 y.o. year old male presenting with dry gangrene of right great toe.   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 Evaluation by Vascular surgery given significant calcifications seen on foot xray. LE Arterial Duplex on 3/21--RLE monophasic w/ ABI of 1.21; LLE WNL. Aortogram per above-vascular states will see how patient does with amputation healing prior to determination of need for BKA - Empiric antibiotics - Vanc/Zosyn per pharmacy started initially. Abx d/c 05/05/12 - Pain control - Vicodin 5-325 mg, 1-2 tablets Q4 PRN. Morphine d/c - PT/OT continuing to follow - Will need follow up with Ortho within one week for evaluation of healing. May still need BKA if he does not heal his wound - Continue post-op boot  # DM-2, uncontrolled as outpatient  - SSI, moderate with good control  - holding home glipizide but will continue at discharge - Lantus 10 units daily  - CBG checks TID AC and QHS  - Diabetes education during admission  - A1c 11.9  # HTN: Stable - Will continue Lopressor 25 mg BID - Will add addition agents as needed.   # HLD  - Starting Lipitor 40 mg.   # CKD now with AKI likely secondary to contrast study - Will monitor BUN/Creatinine closely during admission.  - Cr bump from 1.23-->1.45 --> 1.63-->1.77-->2.24 -->2.12 on 3/29--initially pre-renal. After aortagram 3/26, bump due to contrast and possible high vancomycin level (33). D/c vanc, strict I/Os, IV fluids; monitor for resolution over next 2-4 days; urinating appropriately.  - Creatinine has leveled off and anticipate it to trend down. Continue fluids today, recheck Bmet in the AM and if good trend, can d/c fluids and d/c home. - Continue to encourage PO fluids. Will need outpatient follow up  for improvement to baseline.  # Anemia: likely related to blood loss from surgery - Will continue to follow with CBC. - HgB 8.5 this morning. Will start iron po  FEN/GI: Carb modified  diet. No BM yet; pt refused Miralax. Prophylaxis: Heparin SQ, PPI  Disposition: discharge pending further clinical improvement Code Status: Full code  [employer]. Nieve Rojero, M.D. 05/06/2012, 9:30 AM

## 2012-05-06 NOTE — Progress Notes (Signed)
Family Medicine Teaching Service Attending Note  I interviewed and examined patient Lawrence [name] and reviewed their tests and x-rays.  I discussed with Dr. Mikel Cella and reviewed their note for today.  I agree with their assessment and plan.     Additionally  Feels well Continue IVF for 24 more hours to ensure as much renal recovery as possible  No signs of infection

## 2012-05-06 NOTE — Progress Notes (Signed)
Subjective: 3 Days Post-Op Procedure(s) (LRB): Right First Ray AMPUTATION (Right) Patient reports pain as mild.    Objective: Vital signs in last 24 hours: Temp:  [98.5 F (36.9 C)-98.9 F (37.2 C)] 98.5 F (36.9 C) (03/29 0524) Pulse Rate:  [60-75] 75 (03/29 0524) Resp:  [17-18] 17 (03/29 0524) BP: (139-152)/(63-67) 139/64 mmHg (03/29 0524) SpO2:  [98 %-100 %] 98 % (03/29 0524)  Intake/Output from previous day: 03/28 0701 - 03/29 0700 In: 2381.7 [P.O.:480; I.V.:1901.7] Out: 1550 [Stool:1550] Intake/Output this shift:     Recent Labs  05/03/12 2107 05/04/12 0455 05/06/12 0723  HGB 9.9* 9.0* 8.5*    Recent Labs  05/04/12 0455 05/06/12 0723  WBC 6.8 5.9  RBC 3.37* 3.20*  HCT 27.2* 25.1*  PLT 206 197    Recent Labs  05/05/12 0605 05/06/12 0723  NA 138 137  K 4.2 4.1  CL 103 104  CO2 25 23  BUN 34* 32*  CREATININE 2.24* 2.12*  GLUCOSE 198* 189*  CALCIUM 8.5 8.6   No results found for this basename: LABPT, INR,  in the last 72 hours  dressing changed skin edges look good . good perfusion so far to 2nd toe.  new dressing applied.    Assessment/Plan: 3 Days Post-Op Procedure(s) (LRB): Right First Ray AMPUTATION (Right)    BP pressure up ,  Creatinine down. Surgical site looks good Plan-      Office followup with me. Dressing applied today can stay on.     Tamica Covell,Quintell C 05/06/2012, 9:38 AM

## 2012-05-07 LAB — BASIC METABOLIC PANEL
BUN: 29 mg/dL — ABNORMAL HIGH (ref 6–23)
Chloride: 104 mEq/L (ref 96–112)
GFR calc Af Amer: 42 mL/min — ABNORMAL LOW (ref >90)
GFR calc non Af Amer: 36 mL/min — ABNORMAL LOW (ref >90)
Potassium: 4.2 mEq/L (ref 3.5–5.1)
Sodium: 137 mEq/L (ref 135–145)

## 2012-05-07 LAB — CBC
HCT: 27.5 % — ABNORMAL LOW (ref 39.0–52.0)
Hemoglobin: 9.2 g/dL — ABNORMAL LOW (ref 13.0–17.0)
MCHC: 33.5 g/dL (ref 30.0–36.0)
RBC: 3.43 MIL/uL — ABNORMAL LOW (ref 4.22–5.81)
WBC: 6.2 10*3/uL (ref 4.0–10.5)

## 2012-05-07 MED ORDER — HYDROCODONE-ACETAMINOPHEN 5-325 MG PO TABS: 1.0000 | ORAL_TABLET | ORAL | Status: DC | PRN

## 2012-05-07 NOTE — Discharge Summary (Signed)
FMTS Attending Admission Note: Lawrence Arel,MD I  have seen and examined this patient, reviewed their chart. I have discussed this patient with the resident. I agree with the resident's findings, assessment and care plan.  Patient appears comfortable this morning,no complaints,doing well with his pain control for his foot. I had a lengthy discussion with patient about his kidney function,DM and HTN. Contrast induced nephropathy is improving,I advised patient to recheck his kidney function at his next appointment in 1 wk with his PMD,continue oral hydration at home. DM and BP are not optimally controlled,his home Glipizide dose was increased as discussed with the resident,he is then to follow up with his PMD for further management,in the interim he will continue home glucose and BP monitoring.

## 2012-05-07 NOTE — Progress Notes (Signed)
FMTS Attending Admission Note: Lawrence Eniola,MD I  have seen and examined this patient, reviewed their chart. I have discussed this patient with the resident. I agree with the resident's findings, assessment and care plan.  

## 2012-05-07 NOTE — Progress Notes (Signed)
Daily Progress Note Family Medicine Teaching Service Service Pager: [phone]   Subjective: Doing well this morning, ready to go home today.   Objective:  VITALS Temp:  [98.4 F (36.9 C)-99.4 F (37.4 C)] 98.4 F (36.9 C) (03/30 0603) Pulse Rate:  [62-78] 78 (03/30 0603) Resp:  [18-19] 18 (03/30 0603) BP: (137-188)/(61-78) 176/73 mmHg (03/30 0603) SpO2:  [97 %-98 %] 97 % (03/30 0603)  In/Out  Intake/Output Summary (Last 24 hours) at 05/07/12 0845 Last data filed at 05/07/12 0600  Gross per 24 hour  Intake 2978.33 ml  Output   1100 ml  Net 1878.33 ml    Physical Exam: Gen:  NAD, sitting up in chair with legs elevated CV: Regular rate and rhythm, no murmurs rubs or gallops PULM: clear to auscultation bilaterally. No wheezes/rales/rhonchi EXT: No edema. Right foot wrapped with dressing revealing no drainage. Post-op boot in place. 2+ edema of RLE but able to move toes with no problem. Neuro: Alert and oriented x3  MEDS Scheduled Meds: . aspirin  81 mg Oral Daily  . atorvastatin  40 mg Oral q1800  . enoxaparin (LOVENOX) injection  30 mg Subcutaneous Q24H  . ferrous sulfate  325 mg Oral TID WC  . insulin aspart  0-15 Units Subcutaneous TID WC  . insulin aspart  0-5 Units Subcutaneous QHS  . insulin glargine  10 Units Subcutaneous Daily  . metoprolol tartrate  25 mg Oral BID  . pantoprazole  40 mg Oral Daily   Continuous Infusions: . sodium chloride 100 mL/hr at 05/07/12 0558   PRN Meds:.acetaminophen, acetaminophen, acetaminophen, acetaminophen, guaiFENesin-dextromethorphan, HYDROcodone-acetaminophen, metoCLOPramide (REGLAN) injection, metoCLOPramide, metoprolol, morphine injection, ondansetron (ZOFRAN) IV, ondansetron, phenol, traZODone  Labs and imaging:   CBC  Recent Labs Lab 05/04/12 0455 05/06/12 0723 05/07/12 0545  WBC 6.8 5.9 6.2  HGB 9.0* 8.5* 9.2*  HCT 27.2* 25.1* 27.5*  PLT 206 197 219   BMET/CMET  Recent Labs Lab 05/05/12 0605 05/06/12 0723  05/07/12 0545  NA 138 137 137  K 4.2 4.1 4.2  CL 103 104 104  CO2 25 23 21   BUN 34* 32* 29*  CREATININE 2.24* 2.12* 2.03*  CALCIUM 8.5 8.6 8.8  GLUCOSE 198* 189* 171*   Results for orders placed during the hospital encounter of 05/02/12 (from the past 24 hour(s))  GLUCOSE, CAPILLARY     Status: Abnormal   Collection Time    05/06/12 11:26 AM      Result Value Range   Glucose-Capillary 203 (*) 70 - 99 mg/dL   Comment 1 Notify RN    GLUCOSE, CAPILLARY     Status: Abnormal   Collection Time    05/06/12  5:18 PM      Result Value Range   Glucose-Capillary 147 (*) 70 - 99 mg/dL   Comment 1 Notify RN    GLUCOSE, CAPILLARY     Status: Abnormal   Collection Time    05/06/12 10:05 PM      Result Value Range   Glucose-Capillary 161 (*) 70 - 99 mg/dL   Comment 1 Notify RN     Comment 2 Documented in Chart    BASIC METABOLIC PANEL     Status: Abnormal   Collection Time    05/07/12  5:45 AM      Result Value Range   Sodium 137  135 - 145 mEq/L   Potassium 4.2  3.5 - 5.1 mEq/L   Chloride 104  96 - 112 mEq/L   CO2 21  19 -  32 mEq/L   Glucose, Bld 171 (*) 70 - 99 mg/dL   BUN 29 (*) 6 - 23 mg/dL   Creatinine, Ser 1.61 (*) 0.50 - 1.35 mg/dL   Calcium 8.8  8.4 - 09.6 mg/dL   GFR calc non Af Amer 36 (*) >90 mL/min   GFR calc Af Amer 42 (*) >90 mL/min  CBC     Status: Abnormal   Collection Time    05/07/12  5:45 AM      Result Value Range   WBC 6.2  4.0 - 10.5 K/uL   RBC 3.43 (*) 4.22 - 5.81 MIL/uL   Hemoglobin 9.2 (*) 13.0 - 17.0 g/dL   HCT 04.5 (*) 40.9 - 81.1 %   MCV 80.2  78.0 - 100.0 fL   MCH 26.8  26.0 - 34.0 pg   MCHC 33.5  30.0 - 36.0 g/dL   RDW 91.4  78.2 - 95.6 %   Platelets 219  150 - 400 K/uL   No results found. 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 year old male presenting with dry  gangrene of right great toe.   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 Evaluation by Vascular surgery given significant calcifications seen on foot xray. LE Arterial Duplex on 3/21--RLE monophasic w/ ABI of 1.21; LLE WNL. Aortogram per above-vascular states will see how patient does with amputation healing prior to determination of need for BKA - Empiric antibiotics - Vanc/Zosyn per pharmacy started initially. Abx d/c 05/05/12 - Pain control - Vicodin 5-325 mg, 1-2 tablets Q4 PRN. Morphine d/c - PT/OT continuing to follow - F/U with ortho in one week, may need BKA in future  - Continue post-op boot  # DM-2, uncontrolled as outpatient  - SSI, moderate with good control  - holding home glipizide but will continue at discharge, holding metformin at d/c .  - Lantus 10 units daily  - CBG checks TID AC and QHS  - Diabetes education during admission  - A1c 11.9  # HTN: Stable - Will continue Lopressor 25 mg BID - Will add addition agents as needed.   # HLD  - Starting Lipitor 40 mg.   # CKD now with AKI likely secondary to contrast study - Will monitor BUN/Creatinine closely during admission.  - Cr bump from 1.23-->1.45 --> 1.63-->1.77-->2.24 -->2.12 on 3/29--initially pre-renal. After aortagram 3/26, bump due to contrast and possible high vancomycin level (33). D/c vanc, strict I/Os, IV fluids; monitor for resolution over next 2-4 days; urinating appropriately.  - Creatinine stable today  - Continue to encourage PO fluids. Will need outpatient follow up for improvement to baseline.  # Anemia: likely related to blood loss from surgery - Will continue to follow with CBC. - HgB 9.2, continue iron   FEN/GI: Carb modified diet. No BM yet; pt refused Miralax. Prophylaxis: Heparin SQ, PPI  Disposition: discharge today  Code Status: Full code  Lawrence Grieve R. Lejend Dalby, DO of [facility] 05/07/2012, 10:09 AM

## 2012-05-08 LAB — GLUCOSE, CAPILLARY

## 2012-05-24 ENCOUNTER — Encounter (HOSPITAL_COMMUNITY)
Admission: RE | Admit: 2012-05-24 | Discharge: 2012-05-24 | Disposition: A | Discharge status: Home / Self Care | Payer: Medicaid Other | Source: Ambulatory Visit | Attending: Orthopaedic Surgery | Admitting: Orthopaedic Surgery

## 2012-05-24 ENCOUNTER — Other Ambulatory Visit (HOSPITAL_COMMUNITY): Payer: Self-pay | Admitting: Orthopaedic Surgery

## 2012-05-24 ENCOUNTER — Encounter (HOSPITAL_COMMUNITY): Payer: Self-pay

## 2012-05-24 DIAGNOSIS — M869 Osteomyelitis, unspecified: Secondary | ICD-10-CM

## 2012-05-24 DIAGNOSIS — M79671 Pain in right foot: Secondary | ICD-10-CM

## 2012-05-24 HISTORY — DX: Pure hypercholesterolemia, unspecified: E78.00

## 2012-05-24 LAB — SURGICAL PCR SCREEN
MRSA, PCR: NEGATIVE
Staphylococcus aureus: NEGATIVE

## 2012-05-24 LAB — CBC
Platelets: 218 10*3/uL (ref 150–400)
RBC: 3.86 MIL/uL — ABNORMAL LOW (ref 4.22–5.81)
WBC: 8.8 10*3/uL (ref 4.0–10.5)

## 2012-05-24 LAB — BASIC METABOLIC PANEL
CO2: 31 mEq/L (ref 19–32)
Calcium: 9.7 mg/dL (ref 8.4–10.5)
GFR calc Af Amer: 61 mL/min — ABNORMAL LOW (ref >90)
GFR calc non Af Amer: 53 mL/min — ABNORMAL LOW (ref >90)
Sodium: 144 mEq/L (ref 135–145)

## 2012-05-24 NOTE — Progress Notes (Signed)
Patient denied having a stress test, cardiac cath, or sleep study. PCP is Dr. [name]. During preadmit appointment patient denied having any suicidal or homicidal ideation when sister was not present at chair side. However after preadmit appointment patients sister Lynnell Dike) approached Nurse and informed me that patient appeared to be depressed lately and often called her crying on the telephone, but patient does not take any medication for depression.

## 2012-05-24 NOTE — Pre-Procedure Instructions (Signed)
Lawrence [name]  05/24/2012   Your procedure is scheduled on:  Monday May 29, 2012.  Report to [facility] 3rd floor at 10:30 AM.  Call this number if you have problems the morning of surgery: [phone]   Remember:   Do not eat food or drink liquids after midnight.   Take these medicines the morning of surgery with A SIP OF WATER: Hydrocodone (Vicodin) if needed for pain, Metoprolol (Lopressor), and Omeprazole (Prilosec)  Do not take any diabetic medications the day of your surgery   Do not wear jewelry  Do not wear lotions or colognes.  Men may shave face and neck.  Do not bring valuables to the hospital.  Contacts, dentures or bridgework may not be worn into surgery.  Leave suitcase in the car. After surgery it may be brought to your room.  For patients admitted to the hospital, checkout time is 11:00 AM the day of discharge.   Patients discharged the day of surgery will not be allowed to drive home.  Name and phone number of your driver: Family/Friend  Special Instructions: Shower using CHG 2 nights before surgery and the night before surgery.  If you shower the day of surgery use CHG.  Use special wash - you have one bottle of CHG for all showers.  You should use approximately 1/3 of the bottle for each shower.   Please read over the following fact sheets that you were given: Pain Booklet, Coughing and Deep Breathing, MRSA Information and Surgical Site Infection Prevention

## 2012-05-25 ENCOUNTER — Ambulatory Visit (HOSPITAL_COMMUNITY)
Admission: RE | Admit: 2012-05-25 | Discharge: 2012-05-25 | Disposition: A | Discharge status: Home / Self Care | Payer: Self-pay | Source: Ambulatory Visit | Attending: Orthopaedic Surgery | Admitting: Orthopaedic Surgery

## 2012-05-25 ENCOUNTER — Encounter (HOSPITAL_COMMUNITY): Payer: Self-pay

## 2012-05-25 DIAGNOSIS — S98119A Complete traumatic amputation of unspecified great toe, initial encounter: Secondary | ICD-10-CM | POA: Insufficient documentation

## 2012-05-25 DIAGNOSIS — M869 Osteomyelitis, unspecified: Secondary | ICD-10-CM

## 2012-05-25 DIAGNOSIS — R609 Edema, unspecified: Secondary | ICD-10-CM | POA: Insufficient documentation

## 2012-05-25 DIAGNOSIS — M79671 Pain in right foot: Secondary | ICD-10-CM

## 2012-05-25 DIAGNOSIS — M79609 Pain in unspecified limb: Secondary | ICD-10-CM | POA: Insufficient documentation

## 2012-05-25 MED ORDER — GADOBENATE DIMEGLUMINE 529 MG/ML IV SOLN
18.0000 mL | Freq: Once | INTRAVENOUS | Status: AC
  Administered 2012-05-25: 18 mL via INTRAVENOUS

## 2012-05-25 NOTE — Progress Notes (Signed)
Anesthesia chart review: Patient is a 52 year old male scheduled for right foot debridement and wound VAC application by Dr. Ophelia Charter on 05/29/12.  History includes nonsmoker, diabetes mellitus type 2, hypertension, chronic kidney disease, hypercholesterolemia, right first ray amputation 05/03/12.  EKG on 05/02/12 showed NSR.  He had an echo in 2005 that was technically difficult, but LF functions appeared "OK."  CXR on 05/02/12 showed no acute cardiopulmonary findings.  Preoperative labs noted.  Cr 1.49, down from 2.03 on 05/07/12. H/H 10.0/29.9, up from 05/07/12. PAT RN notes indicate is needed PT/PTT on the day of surgery.  If no significant change then would anticipate that patient could proceed as planned.  Velna Ochs [facility]/Anesthesiology Phone [phone] 05/25/2012 9:48 AM

## 2012-05-26 ENCOUNTER — Encounter (HOSPITAL_COMMUNITY): Payer: Self-pay | Admitting: Pharmacy Technician

## 2012-05-29 ENCOUNTER — Ambulatory Visit (HOSPITAL_COMMUNITY): Payer: Medicaid Other | Admitting: Anesthesiology

## 2012-05-29 ENCOUNTER — Other Ambulatory Visit (HOSPITAL_COMMUNITY): Payer: Self-pay | Admitting: Orthopedic Surgery

## 2012-05-29 ENCOUNTER — Encounter (HOSPITAL_COMMUNITY): Payer: Self-pay | Admitting: Vascular Surgery

## 2012-05-29 ENCOUNTER — Inpatient Hospital Stay (HOSPITAL_COMMUNITY)
Admission: RE | Admit: 2012-05-29 | Discharge: 2012-06-06 | DRG: 474 | Disposition: A | Discharge status: Home / Self Care | Payer: Medicaid Other | Source: Ambulatory Visit | Attending: Orthopaedic Surgery | Admitting: Orthopaedic Surgery

## 2012-05-29 ENCOUNTER — Encounter (HOSPITAL_COMMUNITY)
Admission: RE | Disposition: A | Discharge status: Home / Self Care | Payer: Self-pay | Source: Ambulatory Visit | Attending: Orthopaedic Surgery

## 2012-05-29 ENCOUNTER — Encounter (HOSPITAL_COMMUNITY): Payer: Self-pay | Admitting: Certified Registered Nurse Anesthetist

## 2012-05-29 DIAGNOSIS — N289 Disorder of kidney and ureter, unspecified: Secondary | ICD-10-CM

## 2012-05-29 DIAGNOSIS — E7800 Pure hypercholesterolemia, unspecified: Secondary | ICD-10-CM | POA: Diagnosis present

## 2012-05-29 DIAGNOSIS — T8789 Other complications of amputation stump: Principal | ICD-10-CM | POA: Diagnosis present

## 2012-05-29 DIAGNOSIS — L0390 Cellulitis, unspecified: Secondary | ICD-10-CM

## 2012-05-29 DIAGNOSIS — Y849 Medical procedure, unspecified as the cause of abnormal reaction of the patient, or of later complication, without mention of misadventure at the time of the procedure: Secondary | ICD-10-CM | POA: Diagnosis present

## 2012-05-29 DIAGNOSIS — G609 Hereditary and idiopathic neuropathy, unspecified: Secondary | ICD-10-CM | POA: Diagnosis present

## 2012-05-29 DIAGNOSIS — E860 Dehydration: Secondary | ICD-10-CM | POA: Diagnosis present

## 2012-05-29 DIAGNOSIS — E1165 Type 2 diabetes mellitus with hyperglycemia: Secondary | ICD-10-CM

## 2012-05-29 DIAGNOSIS — N183 Chronic kidney disease, stage 3 unspecified: Secondary | ICD-10-CM | POA: Diagnosis present

## 2012-05-29 DIAGNOSIS — Z794 Long term (current) use of insulin: Secondary | ICD-10-CM

## 2012-05-29 DIAGNOSIS — Z7982 Long term (current) use of aspirin: Secondary | ICD-10-CM

## 2012-05-29 DIAGNOSIS — E1159 Type 2 diabetes mellitus with other circulatory complications: Secondary | ICD-10-CM | POA: Diagnosis present

## 2012-05-29 DIAGNOSIS — E11621 Type 2 diabetes mellitus with foot ulcer: Secondary | ICD-10-CM

## 2012-05-29 DIAGNOSIS — A498 Other bacterial infections of unspecified site: Secondary | ICD-10-CM | POA: Diagnosis present

## 2012-05-29 DIAGNOSIS — T8149XA Infection following a procedure, other surgical site, initial encounter: Secondary | ICD-10-CM

## 2012-05-29 DIAGNOSIS — D62 Acute posthemorrhagic anemia: Secondary | ICD-10-CM | POA: Diagnosis not present

## 2012-05-29 DIAGNOSIS — E872 Acidosis, unspecified: Secondary | ICD-10-CM | POA: Diagnosis present

## 2012-05-29 DIAGNOSIS — N170 Acute kidney failure with tubular necrosis: Secondary | ICD-10-CM | POA: Diagnosis present

## 2012-05-29 DIAGNOSIS — N179 Acute kidney failure, unspecified: Secondary | ICD-10-CM

## 2012-05-29 DIAGNOSIS — G629 Polyneuropathy, unspecified: Secondary | ICD-10-CM

## 2012-05-29 DIAGNOSIS — I129 Hypertensive chronic kidney disease with stage 1 through stage 4 chronic kidney disease, or unspecified chronic kidney disease: Secondary | ICD-10-CM | POA: Diagnosis present

## 2012-05-29 DIAGNOSIS — M869 Osteomyelitis, unspecified: Secondary | ICD-10-CM | POA: Diagnosis present

## 2012-05-29 DIAGNOSIS — I96 Gangrene, not elsewhere classified: Secondary | ICD-10-CM | POA: Diagnosis present

## 2012-05-29 HISTORY — PX: I&D EXTREMITY: SHX5045

## 2012-05-29 LAB — GLUCOSE, CAPILLARY
Glucose-Capillary: 100 mg/dL — ABNORMAL HIGH (ref 70–99)
Glucose-Capillary: 99 mg/dL (ref 70–99)
Glucose-Capillary: 57 mg/dL — ABNORMAL LOW (ref 70–99)
Glucose-Capillary: 58 mg/dL — ABNORMAL LOW (ref 70–99)
Glucose-Capillary: 69 mg/dL — ABNORMAL LOW (ref 70–99)

## 2012-05-29 SURGERY — IRRIGATION AND DEBRIDEMENT EXTREMITY
Anesthesia: General | Site: Foot | Laterality: Right | Wound class: Dirty or Infected

## 2012-05-29 MED ORDER — PHENYLEPHRINE HCL 10 MG/ML IJ SOLN
INTRAMUSCULAR | Status: DC | PRN
  Administered 2012-05-29 (×2): 80 ug via INTRAVENOUS
  Administered 2012-05-29 (×2): 40 ug via INTRAVENOUS

## 2012-05-29 MED ORDER — GLUCOSE 40 % PO GEL
ORAL | Status: AC
  Administered 2012-05-29: 37.5 g
  Filled 2012-05-29: qty 1

## 2012-05-29 MED ORDER — METOPROLOL TARTRATE 25 MG PO TABS
25.0000 mg | ORAL_TABLET | Freq: Once | ORAL | Status: AC
  Administered 2012-05-29: 25 mg via ORAL

## 2012-05-29 MED ORDER — ASPIRIN 81 MG PO CHEW
81.0000 mg | CHEWABLE_TABLET | Freq: Every morning | ORAL | Status: DC
  Administered 2012-05-30 – 2012-06-06 (×8): 81 mg via ORAL
  Filled 2012-05-29 (×9): qty 1

## 2012-05-29 MED ORDER — VANCOMYCIN HCL 10 G IV SOLR
1750.0000 mg | INTRAVENOUS | Status: DC
  Administered 2012-05-29 – 2012-05-30 (×2): 1750 mg via INTRAVENOUS
  Filled 2012-05-29 (×3): qty 1750

## 2012-05-29 MED ORDER — GLIPIZIDE ER 10 MG PO TB24
10.0000 mg | ORAL_TABLET | Freq: Two times a day (BID) | ORAL | Status: DC
  Administered 2012-05-30: 10 mg via ORAL
  Filled 2012-05-29 (×4): qty 1

## 2012-05-29 MED ORDER — CEFAZOLIN SODIUM-DEXTROSE 2-3 GM-% IV SOLR: 2.0000 g | INTRAVENOUS | Status: DC

## 2012-05-29 MED ORDER — INSULIN GLARGINE 100 UNIT/ML ~~LOC~~ SOLN
20.0000 [IU] | Freq: Every day | SUBCUTANEOUS | Status: DC
  Filled 2012-05-29 (×3): qty 0.2

## 2012-05-29 MED ORDER — INSULIN ASPART 100 UNIT/ML ~~LOC~~ SOLN
4.0000 [IU] | Freq: Three times a day (TID) | SUBCUTANEOUS | Status: DC
  Administered 2012-05-30 (×2): 4 [IU] via SUBCUTANEOUS

## 2012-05-29 MED ORDER — ONDANSETRON HCL 4 MG/2ML IJ SOLN
4.0000 mg | Freq: Four times a day (QID) | INTRAMUSCULAR | Status: DC | PRN
  Administered 2012-05-29 – 2012-06-01 (×4): 4 mg via INTRAVENOUS
  Filled 2012-05-29 (×3): qty 2

## 2012-05-29 MED ORDER — ONDANSETRON HCL 4 MG PO TABS
4.0000 mg | ORAL_TABLET | Freq: Four times a day (QID) | ORAL | Status: DC | PRN
  Administered 2012-06-02 – 2012-06-04 (×3): 4 mg via ORAL
  Filled 2012-05-29 (×3): qty 1

## 2012-05-29 MED ORDER — DEXTROSE 50 % IV SOLN
INTRAVENOUS | Status: DC | PRN
  Administered 2012-05-29: 25 mL via INTRAVENOUS

## 2012-05-29 MED ORDER — LACTATED RINGERS IV SOLN
INTRAVENOUS | Status: DC | PRN
  Administered 2012-05-29 (×2): via INTRAVENOUS

## 2012-05-29 MED ORDER — OXYCODONE-ACETAMINOPHEN 5-325 MG PO TABS
1.0000 | ORAL_TABLET | ORAL | Status: DC | PRN
  Administered 2012-05-29 – 2012-06-03 (×4): 2 via ORAL
  Administered 2012-06-04 (×2): 1 via ORAL
  Administered 2012-06-05: 2 via ORAL
  Filled 2012-05-29 (×2): qty 2
  Filled 2012-05-29: qty 1
  Filled 2012-05-29 (×2): qty 2
  Filled 2012-05-29: qty 1
  Filled 2012-05-29 (×2): qty 2

## 2012-05-29 MED ORDER — PROMETHAZINE HCL 25 MG/ML IJ SOLN: 6.2500 mg | INTRAMUSCULAR | Status: DC | PRN

## 2012-05-29 MED ORDER — DOCUSATE SODIUM 100 MG PO CAPS
100.0000 mg | ORAL_CAPSULE | Freq: Two times a day (BID) | ORAL | Status: DC
  Administered 2012-05-29 – 2012-06-06 (×15): 100 mg via ORAL
  Filled 2012-05-29 (×19): qty 1

## 2012-05-29 MED ORDER — LISINOPRIL-HYDROCHLOROTHIAZIDE 20-12.5 MG PO TABS: 1.0000 | ORAL_TABLET | Freq: Every day | ORAL | Status: DC

## 2012-05-29 MED ORDER — DEXTROSE 50 % IV SOLN
INTRAVENOUS | Status: AC
  Filled 2012-05-29: qty 50

## 2012-05-29 MED ORDER — OXYCODONE HCL 5 MG PO TABS: 5.0000 mg | ORAL_TABLET | Freq: Once | ORAL | Status: DC | PRN

## 2012-05-29 MED ORDER — LIDOCAINE HCL (CARDIAC) 20 MG/ML IV SOLN
INTRAVENOUS | Status: DC | PRN
  Administered 2012-05-29: 80 mg via INTRAVENOUS

## 2012-05-29 MED ORDER — METFORMIN HCL 850 MG PO TABS
850.0000 mg | ORAL_TABLET | Freq: Two times a day (BID) | ORAL | Status: DC
  Administered 2012-05-30: 850 mg via ORAL
  Filled 2012-05-29 (×6): qty 1

## 2012-05-29 MED ORDER — ONDANSETRON HCL 4 MG/2ML IJ SOLN
INTRAMUSCULAR | Status: AC
  Filled 2012-05-29: qty 2

## 2012-05-29 MED ORDER — INSULIN GLARGINE 100 UNIT/ML ~~LOC~~ SOLN: 20.0000 [IU] | Freq: Every day | SUBCUTANEOUS | Status: DC

## 2012-05-29 MED ORDER — DEXTROSE 50 % IV SOLN
50.0000 mL | Freq: Once | INTRAVENOUS | Status: AC | PRN
  Administered 2012-05-29: 50 mL via INTRAVENOUS

## 2012-05-29 MED ORDER — PANTOPRAZOLE SODIUM 40 MG PO TBEC
40.0000 mg | DELAYED_RELEASE_TABLET | Freq: Every day | ORAL | Status: DC
  Administered 2012-05-30 – 2012-06-06 (×8): 40 mg via ORAL
  Filled 2012-05-29 (×8): qty 1

## 2012-05-29 MED ORDER — SODIUM CHLORIDE 0.45 % IV SOLN
INTRAVENOUS | Status: DC
  Administered 2012-05-31: 23:00:00 via INTRAVENOUS

## 2012-05-29 MED ORDER — PIPERACILLIN-TAZOBACTAM 3.375 G IVPB 30 MIN
3.3750 g | Freq: Three times a day (TID) | INTRAVENOUS | Status: DC
  Administered 2012-05-29 (×2): 3.375 g via INTRAVENOUS
  Filled 2012-05-29 (×4): qty 50

## 2012-05-29 MED ORDER — MORPHINE SULFATE 2 MG/ML IJ SOLN: 1.0000 mg | INTRAMUSCULAR | Status: DC | PRN

## 2012-05-29 MED ORDER — INSULIN ASPART 100 UNIT/ML ~~LOC~~ SOLN
0.0000 [IU] | Freq: Three times a day (TID) | SUBCUTANEOUS | Status: DC
  Administered 2012-05-30: 3 [IU] via SUBCUTANEOUS

## 2012-05-29 MED ORDER — INSULIN ASPART 100 UNIT/ML ~~LOC~~ SOLN
0.0000 [IU] | Freq: Every day | SUBCUTANEOUS | Status: DC
  Administered 2012-06-04: 2 [IU] via SUBCUTANEOUS

## 2012-05-29 MED ORDER — SODIUM CHLORIDE 0.9 % IR SOLN
Status: DC | PRN
  Administered 2012-05-29: 1000 mL

## 2012-05-29 MED ORDER — MIDAZOLAM HCL 5 MG/5ML IJ SOLN
INTRAMUSCULAR | Status: DC | PRN
  Administered 2012-05-29: 1 mg via INTRAVENOUS

## 2012-05-29 MED ORDER — ONDANSETRON HCL 4 MG/2ML IJ SOLN
INTRAMUSCULAR | Status: DC | PRN
  Administered 2012-05-29: 4 mg via INTRAVENOUS

## 2012-05-29 MED ORDER — BISACODYL 10 MG RE SUPP: 10.0000 mg | Freq: Every day | RECTAL | Status: DC | PRN

## 2012-05-29 MED ORDER — SUCCINYLCHOLINE CHLORIDE 20 MG/ML IJ SOLN
INTRAMUSCULAR | Status: DC | PRN
  Administered 2012-05-29: 100 mg via INTRAVENOUS

## 2012-05-29 MED ORDER — GLIPIZIDE ER 10 MG PO TB24
10.0000 mg | ORAL_TABLET | Freq: Two times a day (BID) | ORAL | Status: DC
  Filled 2012-05-29: qty 1

## 2012-05-29 MED ORDER — METOCLOPRAMIDE HCL 5 MG/ML IJ SOLN
5.0000 mg | Freq: Three times a day (TID) | INTRAMUSCULAR | Status: DC | PRN
  Administered 2012-05-29 – 2012-05-30 (×2): 10 mg via INTRAVENOUS
  Filled 2012-05-29 (×2): qty 2

## 2012-05-29 MED ORDER — OXYCODONE HCL 5 MG/5ML PO SOLN: 5.0000 mg | Freq: Once | ORAL | Status: DC | PRN

## 2012-05-29 MED ORDER — DEXTROSE 50 % IV SOLN
12.5000 g | INTRAVENOUS | Status: AC
  Administered 2012-05-29: 11:00:00 via INTRAVENOUS

## 2012-05-29 MED ORDER — LACTATED RINGERS IV SOLN
INTRAVENOUS | Status: DC
  Administered 2012-05-29: 11:00:00 via INTRAVENOUS

## 2012-05-29 MED ORDER — DIPHENHYDRAMINE HCL 12.5 MG/5ML PO ELIX: 12.5000 mg | ORAL_SOLUTION | ORAL | Status: DC | PRN

## 2012-05-29 MED ORDER — METOCLOPRAMIDE HCL 10 MG PO TABS
5.0000 mg | ORAL_TABLET | Freq: Three times a day (TID) | ORAL | Status: DC | PRN
  Administered 2012-05-30: 10 mg via ORAL
  Filled 2012-05-29: qty 1

## 2012-05-29 MED ORDER — METOPROLOL TARTRATE 25 MG PO TABS
25.0000 mg | ORAL_TABLET | Freq: Two times a day (BID) | ORAL | Status: DC
  Administered 2012-05-29 – 2012-06-06 (×16): 25 mg via ORAL
  Filled 2012-05-29 (×17): qty 1

## 2012-05-29 MED ORDER — HYDROCODONE-ACETAMINOPHEN 5-325 MG PO TABS
1.0000 | ORAL_TABLET | ORAL | Status: DC | PRN
  Administered 2012-06-02 (×2): 2 via ORAL
  Filled 2012-05-29 (×2): qty 2

## 2012-05-29 MED ORDER — FENTANYL CITRATE 0.05 MG/ML IJ SOLN
INTRAMUSCULAR | Status: DC | PRN
  Administered 2012-05-29 (×3): 50 ug via INTRAVENOUS

## 2012-05-29 MED ORDER — CEFAZOLIN SODIUM-DEXTROSE 2-3 GM-% IV SOLR
INTRAVENOUS | Status: AC
  Administered 2012-05-29: 2 g via INTRAVENOUS
  Filled 2012-05-29: qty 50

## 2012-05-29 MED ORDER — PROPOFOL 10 MG/ML IV BOLUS
INTRAVENOUS | Status: DC | PRN
  Administered 2012-05-29: 160 mg via INTRAVENOUS

## 2012-05-29 MED ORDER — METOPROLOL TARTRATE 12.5 MG HALF TABLET
ORAL_TABLET | ORAL | Status: AC
  Filled 2012-05-29: qty 2

## 2012-05-29 MED ORDER — POLYETHYLENE GLYCOL 3350 17 G PO PACK
17.0000 g | PACK | Freq: Every day | ORAL | Status: DC | PRN
  Administered 2012-06-05: 17 g via ORAL
  Filled 2012-05-29: qty 1

## 2012-05-29 MED ORDER — LISINOPRIL 20 MG PO TABS
20.0000 mg | ORAL_TABLET | Freq: Every day | ORAL | Status: DC
  Administered 2012-05-29 – 2012-05-30 (×2): 20 mg via ORAL
  Filled 2012-05-29 (×3): qty 1

## 2012-05-29 MED ORDER — HYDROMORPHONE HCL PF 1 MG/ML IJ SOLN: 0.2500 mg | INTRAMUSCULAR | Status: DC | PRN

## 2012-05-29 MED ORDER — HYDROCHLOROTHIAZIDE 12.5 MG PO CAPS
12.5000 mg | ORAL_CAPSULE | Freq: Every day | ORAL | Status: DC
  Administered 2012-05-30: 12.5 mg via ORAL
  Filled 2012-05-29 (×3): qty 1

## 2012-05-29 SURGICAL SUPPLY — 42 items
BAG DECANTER FOR FLEXI CONT (MISCELLANEOUS) ×2 IMPLANT
BANDAGE ELASTIC 4 VELCRO ST LF (GAUZE/BANDAGES/DRESSINGS) IMPLANT
BANDAGE GAUZE ELAST BULKY 4 IN (GAUZE/BANDAGES/DRESSINGS) IMPLANT
BLADE AVERAGE 25X9 (BLADE) ×2 IMPLANT
BNDG COHESIVE 4X5 TAN STRL (GAUZE/BANDAGES/DRESSINGS) ×2 IMPLANT
CANISTER WOUND CARE 500ML ATS (WOUND CARE) ×2 IMPLANT
CLOTH BEACON ORANGE TIMEOUT ST (SAFETY) ×2 IMPLANT
COVER SURGICAL LIGHT HANDLE (MISCELLANEOUS) ×2 IMPLANT
CUFF TOURNIQUET SINGLE 18IN (TOURNIQUET CUFF) ×2 IMPLANT
DRSG EMULSION OIL 3X3 NADH (GAUZE/BANDAGES/DRESSINGS) IMPLANT
DRSG PAD ABDOMINAL 8X10 ST (GAUZE/BANDAGES/DRESSINGS) IMPLANT
DRSG VAC ATS SM SENSATRAC (GAUZE/BANDAGES/DRESSINGS) ×2 IMPLANT
ELECT REM PT RETURN 9FT ADLT (ELECTROSURGICAL)
ELECTRODE REM PT RTRN 9FT ADLT (ELECTROSURGICAL) IMPLANT
GAUZE XEROFORM 5X9 LF (GAUZE/BANDAGES/DRESSINGS) IMPLANT
GLOVE BIOGEL PI IND STRL 8 (GLOVE) ×1 IMPLANT
GLOVE BIOGEL PI INDICATOR 8 (GLOVE) ×1
GLOVE ORTHO TXT STRL SZ7.5 (GLOVE) ×2 IMPLANT
GOWN PREVENTION PLUS LG XLONG (DISPOSABLE) IMPLANT
GOWN PREVENTION PLUS XLARGE (GOWN DISPOSABLE) ×2 IMPLANT
GOWN STRL NON-REIN LRG LVL3 (GOWN DISPOSABLE) ×2 IMPLANT
HANDPIECE INTERPULSE COAX TIP (DISPOSABLE)
KIT BASIN OR (CUSTOM PROCEDURE TRAY) IMPLANT
KIT ROOM TURNOVER OR (KITS) ×2 IMPLANT
MANIFOLD NEPTUNE II (INSTRUMENTS) IMPLANT
NS IRRIG 1000ML POUR BTL (IV SOLUTION) ×2 IMPLANT
PACK ORTHO EXTREMITY (CUSTOM PROCEDURE TRAY) ×2 IMPLANT
PAD ARMBOARD 7.5X6 YLW CONV (MISCELLANEOUS) ×4 IMPLANT
SET HNDPC FAN SPRY TIP SCT (DISPOSABLE) IMPLANT
SPONGE GAUZE 4X4 12PLY (GAUZE/BANDAGES/DRESSINGS) IMPLANT
SPONGE LAP 18X18 X RAY DECT (DISPOSABLE) ×2 IMPLANT
SPONGE LAP 4X18 X RAY DECT (DISPOSABLE) IMPLANT
STOCKINETTE IMPERVIOUS 9X36 MD (GAUZE/BANDAGES/DRESSINGS) ×2 IMPLANT
SUT ETHILON 2 0 FS 18 (SUTURE) ×2 IMPLANT
SUT ETHILON 4 0 PS 2 18 (SUTURE) IMPLANT
TOWEL OR 17X24 6PK STRL BLUE (TOWEL DISPOSABLE) ×2 IMPLANT
TOWEL OR 17X26 10 PK STRL BLUE (TOWEL DISPOSABLE) ×2 IMPLANT
TUBE ANAEROBIC SPECIMEN COL (MISCELLANEOUS) ×2 IMPLANT
TUBE CONNECTING 12X1/4 (SUCTIONS) ×2 IMPLANT
UNDERPAD 30X30 INCONTINENT (UNDERPADS AND DIAPERS) ×2 IMPLANT
WATER STERILE IRR 1000ML POUR (IV SOLUTION) IMPLANT
YANKAUER SUCT BULB TIP NO VENT (SUCTIONS) ×2 IMPLANT

## 2012-05-29 NOTE — Progress Notes (Signed)
ANTIBIOTIC CONSULT NOTE - INITIAL  Pharmacy Consult for Vancomycin Indication: Right Foot Wound  No Known Allergies  Patient Measurements:  Body Weight: 87.1 kg  Height 165 CM  Vital Signs: Temp: 98 F (36.7 C) (04/21 1445) BP: 172/75 mmHg (04/21 1445) Pulse Rate: 90 (04/21 1445)  Microbiology: Recent Results (from the past 720 hour(s))  SURGICAL PCR SCREEN     Status: Abnormal   Collection Time    05/03/12  5:19 AM      Result Value Range Status   MRSA, PCR NEGATIVE  NEGATIVE Final   Staphylococcus aureus POSITIVE (*) NEGATIVE Final   Comment:            The Xpert SA Assay (FDA     approved for NASAL specimens     in patients over 36 years of age),     is one component of     a comprehensive surveillance     program.  Test performance has     been validated by [employer] for patients greater     than or equal to 3 year old.     It is not intended     to diagnose infection nor to     guide or monitor treatment.  SURGICAL PCR SCREEN     Status: None   Collection Time    05/24/12  2:38 PM      Result Value Range Status   MRSA, PCR NEGATIVE  NEGATIVE Final   Staphylococcus aureus NEGATIVE  NEGATIVE Final   Comment:            The Xpert SA Assay (FDA     approved for NASAL specimens     in patients over 81 years of age),     is one component of     a comprehensive surveillance     program.  Test performance has     been validated by [employer] for patients greater     than or equal to 42 year old.     It is not intended     to diagnose infection nor to     guide or monitor treatment.    Medical History: Past Medical History  Diagnosis Date  . Diabetes mellitus   . Hypertension   . Chronic kidney disease   . Hypercholesteremia    Medications:  Anti-infectives   Start     Dose/Rate Route Frequency Ordered Stop   05/29/12 1600  piperacillin-tazobactam (ZOSYN) IVPB 3.375 g     3.375 g 100 mL/hr over 30 Minutes Intravenous 3 times per day  05/29/12 1545     05/29/12 1044  ceFAZolin (ANCEF) 2-3 GM-% IVPB SOLR    Comments:  KENSMOE, KATHERINE: cabinet override      05/29/12 1044 05/29/12 1245   05/29/12 1038  ceFAZolin (ANCEF) IVPB 2 g/50 mL premix  Status:  Discontinued     2 g 100 mL/hr over 30 Minutes Intravenous On call to O.R. 05/29/12 1038 05/29/12 1517     Assessment: This is a 52 yo male for whom we have consulted in the past for IV antibiotics.  He presents with wound dehiscence and is now s/p debridement.  We have been asked to initiate IV Vancomycin for him.  Culture data is pending from the OR.  His most recent MRSA PCR screen was negative.  He is about 86 kg and has a creatinine of 1.49 with an estimated clearance of ~  50 ml/min.  He had a bump in his creatinine on his last admit a couple of weeks ago and his Vancomycin trough was higher than the desired goal range on 1gm. every 12 hours.  He has received Zosyn 3.375 gm and Cefazolin 2gm. today.  Goal of Therapy:  Vancomycin trough level 15-20 mcg/ml  Plan:  1.  Will extend interval dosing this time and begin 1750 mg IV every 24 hours. 2.  Monitor renal function and adjust dose as needed. 3.  Will check steady state levels if this is continued > 72 hr.  Nadara Mustard, PharmD., MS Clinical Pharmacist Pager:  [phone] Thank you for allowing pharmacy to be part of this patients care team. 05/29/2012,4:00 PM

## 2012-05-29 NOTE — Brief Op Note (Signed)
05/29/2012  1:55 PM  PATIENT:  Lawrence [name]  52 y.o. male  PRE-OPERATIVE DIAGNOSIS:  Right Foot 1st Ray Amputation Dehiscence  POST-OPERATIVE DIAGNOSIS:  Right Foot 1st Ray Amputation Dehiscence  PROCEDURE:  Procedure(s) with comments: IRRIGATION AND DEBRIDEMENT EXTREMITY- right foot (Right) - Right Foot Debridement, VAC Application  Revision right 1st ray amputation.   SURGEON:  Surgeon(s) and Role:    * Eldred Manges, MD - Primary  PHYSICIAN ASSISTANT: none  ASSISTANTS: none   ANESTHESIA:   general   LMA  NEEDED FOR VERY ANTERIOR TRACHEA ANATOMY  EBL:  Total I/O In: 1200 [I.V.:1200] Out: -   BLOOD ADMINISTERED:none  DRAINS: none   VAC small.   Placed 100 continuous suction  LOCAL MEDICATIONS USED:  NONE  SPECIMEN:  No Specimen  Aerobic and anaerobic cultures obtained.   DISPOSITION OF SPECIMEN:  N/A  COUNTS:  YES  TOURNIQUET:   Total Tourniquet Time Documented: Thigh (Right) - 20 minutes Total: Thigh (Right) - 20 minutes   DICTATION: .Other Dictation: Dictation Number [id number]  PLAN OF CARE: Admit to inpatient   PATIENT DISPOSITION:  PACU - hemodynamically stable.   Delay start of Pharmacological VTE agent (>24hrs) due to surgical blood loss or risk of bleeding: none due to [facility] and bleeding risks

## 2012-05-29 NOTE — H&P (Signed)
Lawrence [name] is an 52 y.o. male.   Chief Complaint: previous right 1st ray amp with partial dehiscence and MRI with likely osteo of 1.7 cm of remaining 1 st shaft, possible 2nd MT osteo and small fluid collection, possible remaining/ recurrent abscess.  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 Seen Thursday last week with partial edge necrosis and drainage of serosang. Fluid. MRI done over weekend showed evidence of residual infection.   Past Medical History  Diagnosis Date  . Diabetes mellitus   . Hypertension   . Chronic kidney disease   . Hypercholesteremia     Past Surgical History  Procedure Laterality Date  . Amputation Right 05/03/2012    Procedure: Right First Ray AMPUTATION;  Surgeon: Eldred Manges, MD;  Location: [facility] OR;  Service: Orthopedics;  Laterality: Right;    No family history on file. Social History:  reports that he has never smoked. He has never used smokeless tobacco. He reports that he does not drink alcohol or use illicit drugs.  Allergies: No Known Allergies  Medications Prior to Admission  Medication Sig Dispense Refill  . aspirin 81 MG tablet Take 81 mg by mouth every morning.       Marland Kitchen doxycycline (VIBRA-TABS) 100 MG tablet Take 100 mg by mouth 2 (two) times daily.      Marland Kitchen glipiZIDE (GLUCOTROL XL) 10 MG 24 hr tablet Take 1 tablet (10 mg total) by mouth 2 (two) times daily.  60 tablet  11  . HYDROcodone-acetaminophen (NORCO/VICODIN) 5-325 MG per tablet Take 1-2 tablets by mouth every 4 (four) hours as needed. For pain      . insulin glargine (LANTUS) 100 UNIT/ML injection Inject 20 Units into the skin at bedtime.  10 mL  6  . lisinopril-hydrochlorothiazide (PRINZIDE,ZESTORETIC) 20-12.5 MG per tablet Take 1 tablet by mouth daily.      . metFORMIN (GLUCOPHAGE) 850 MG tablet Take 850 mg by mouth 2 (two) times daily with a meal.      . metoprolol tartrate (LOPRESSOR) 25 MG tablet Take 1 tablet (25 mg total) by mouth 2 (two)  times daily.  180 tablet  3  . omeprazole (PRILOSEC) 20 MG capsule Take 1 capsule (20 mg total) by mouth daily.  30 capsule  3    Results for orders placed during the hospital encounter of 05/29/12 (from the past 48 hour(s))  GLUCOSE, CAPILLARY     Status: Abnormal   Collection Time    05/29/12 10:53 AM      Result Value Range   Glucose-Capillary 44 (*) 70 - 99 mg/dL  GLUCOSE, CAPILLARY     Status: Abnormal   Collection Time    05/29/12 11:26 AM      Result Value Range   Glucose-Capillary 129 (*) 70 - 99 mg/dL  GLUCOSE, CAPILLARY     Status: Abnormal   Collection Time    05/29/12 11:58 AM      Result Value Range   Glucose-Capillary 100 (*) 70 - 99 mg/dL   No results found.  Review of Systems  Constitutional: Negative for fever and chills.  Respiratory: Negative.   Cardiovascular: Negative.   Genitourinary: Negative.   Skin: Negative for rash.  Neurological: Negative for headaches.       Peripheral neuropathy  Psychiatric/Behavioral: The patient is nervous/anxious.     There were no vitals taken for this visit. Physical Exam  Constitutional: He is oriented to person, place, and time.  He appears well-developed and well-nourished.  HENT:  Head: Normocephalic.  Eyes: Pupils are equal, round, and reactive to light.  Neck: Normal range of motion.  Cardiovascular: Normal rate.   Respiratory: Effort normal.  GI: Soft.  Musculoskeletal:  Right 1st ray amp with drainage stopped since last Thursday when seen in the office.    Neurological: He is alert and oriented to person, place, and time.  Skin: Skin is warm.  Psychiatric: He has a normal mood and affect. His behavior is normal. Judgment and thought content normal.     Assessment/Plan   S/P 1st ray amp right with post op drainage which has now stopped with fluid collection and skin edge necrosis, MRI shows edema in shaft and also in 2nd MT bone which may be osteo and also fluid collection at site of surgery possible post  op abscess. Plan exploration , cultures, possible shortening of 1st ray amp and possible 2nd ray amp if there is obvious osteo on examination of 2nd MT bone. Possible VAC post op. Discussed in detail, he is anxious about his diabetes and possibility of eventual BKA.  Had been driving and diabetic control has not been as good as he would like. Procedure and risks, plan discussed. He agrees to proceed.   Lawrence [name],Lawrence [name] 05/29/2012, 12:27 PM

## 2012-05-29 NOTE — Progress Notes (Signed)
hyHypoglycemic Event  CBG: 55  Treatment: 1 tube instant glucose  Symptoms: None  Follow-up CBG: Time:1711 CBG Result:58  Possible Reasons for Event: Inadequate meal intake  Comments/MD notified:   Samuel Germany, Dartha Lodge  Remember to initiate Hypoglycemia Order Set & complete

## 2012-05-29 NOTE — Anesthesia Procedure Notes (Signed)
Procedure Name: Intubation Date/Time: 05/29/2012 12:53 PM Performed by: Lovie Chol Pre-anesthesia Checklist: Patient identified, Emergency Drugs available, Patient being monitored, Suction available and Timeout performed Patient Re-evaluated:Patient Re-evaluated prior to inductionOxygen Delivery Method: Circle system utilized Preoxygenation: Pre-oxygenation with 100% oxygen Intubation Type: IV induction Ventilation: Mask ventilation without difficulty and Oral airway inserted - appropriate to patient size Grade View: Grade IV Tube type: Oral Tube size: 7.5 mm Number of attempts: 2 Airway Equipment and Method: Stylet and Video-laryngoscopy Placement Confirmation: positive ETCO2,  CO2 detector and breath sounds checked- equal and bilateral Secured at: 22 cm Tube secured with: Tape Dental Injury: Teeth and Oropharynx as per pre-operative assessment  Difficulty Due To: Difficulty was unanticipated and Difficult Airway- due to anterior larynx Future Recommendations: Recommend- induction with short-acting agent, and alternative techniques readily available Comments: Smooth IV induction. EZ mask with 9mm OA. Proseal LMA #4 placed without difficulty. No ETCO2. LMA repositioned. No ETCO2. LMA removed immediately. EZ mask with 9mm OA. DL x 2 with Glidescope. Grade IV view. ETT 7.5 placed with ease. +ETCO2 bbse.

## 2012-05-29 NOTE — Progress Notes (Signed)
Hypoglycemic Event  CBG: 58  Treatment: D50 IV 50 mL  Symptoms: None  Follow-up CBG: Time:1800 CBG Result:146  Possible Reasons for Event: Inadequate meal intake  Comments/MD notified:    Lawrence [name], Lawrence [name]  Remember to initiate Hypoglycemia Order Set & complete

## 2012-05-29 NOTE — Progress Notes (Signed)
At patient bedside to discuss PICC placement and insert.  Information given regarding placement, use, risk and benefits.  Patient with appropriate questions.  Patient would like to wait until tomorrow after speaking with MD regarding what his treatment plan is.  Tamera Pingley, Lajean Manes, RN VAST.

## 2012-05-29 NOTE — Interval H&P Note (Signed)
History and Physical Interval Note:  05/29/2012 12:35 PM  Lawrence [name]  has presented today for surgery, with the diagnosis of Right Foot 1st Ray Amputation Dehiscence  The various methods of treatment have been discussed with the patient and family. After consideration of risks, benefits and other options for treatment, the patient has consented to  Procedure(s) with comments: IRRIGATION AND DEBRIDEMENT EXTREMITY (Right) - Right Foot Debridement, VAC Application possible 1st ray shortening and 2nd ray amputation discussed  as a surgical intervention .  The patient's history has been reviewed, patient examined, no change in status, stable for surgery.  I have reviewed the patient's chart and labs.  Questions were answered to the patient's satisfaction.     Gresia Isidoro,Finnick C

## 2012-05-29 NOTE — Progress Notes (Signed)
CBG 44.  Pt had taken 1/2 of lantus dose last night and also his glipizide and metforman.  Last PO intake at 2000 last night.  States can feel his sugar is low.  Remains alert and oriented. Dr Chaney Malling notified and orders received.

## 2012-05-29 NOTE — Preoperative (Signed)
Beta Blockers   Reason not to administer Beta Blockers:Not Applicable 

## 2012-05-29 NOTE — Transfer of Care (Signed)
Immediate Anesthesia Transfer of Care Note  Patient: Lawrence [name]  Procedure(s) Performed: Procedure(s) with comments: IRRIGATION AND DEBRIDEMENT EXTREMITY- right foot (Right) - Right Foot Debridement, VAC Application  Patient Location: PACU  Anesthesia Type:General  Level of Consciousness: awake, alert , oriented and patient cooperative  Airway & Oxygen Therapy: Patient Spontanous Breathing and Patient connected to nasal cannula oxygen  Post-op Assessment: Report given to PACU RN and Post -op Vital signs reviewed and stable  Post vital signs: Reviewed and stable  Complications: No apparent anesthesia complications

## 2012-05-29 NOTE — Anesthesia Preprocedure Evaluation (Addendum)
Anesthesia Evaluation  Patient identified by MRN, date of birth, ID band Patient awake    Reviewed: Allergy & Precautions, H&P , NPO status , Patient's Chart, lab work & pertinent test results, reviewed documented beta blocker date and time   History of Anesthesia Complications Negative for: history of anesthetic complications  Airway Mallampati: II TM Distance: >3 FB Neck ROM: Full    Dental  (+) Teeth Intact and Dental Advisory Given   Pulmonary neg pulmonary ROS,  breath sounds clear to auscultation        Cardiovascular hypertension, Pt. on medications and Pt. on home beta blockers Rhythm:Regular Rate:Normal     Neuro/Psych negative neurological ROS     GI/Hepatic Neg liver ROS, GERD-  Medicated and Controlled,  Endo/Other  diabetes, Poorly Controlled, Type 2, Insulin Dependent and Oral Hypoglycemic Agents  Renal/GU Renal InsufficiencyRenal disease     Musculoskeletal   Abdominal (+) + obese,   Peds  Hematology negative hematology ROS (+)   Anesthesia Other Findings   Reproductive/Obstetrics negative OB ROS                          Anesthesia Physical Anesthesia Plan  ASA: III  Anesthesia Plan: General   Post-op Pain Management:    Induction: Intravenous  Airway Management Planned: LMA  Additional Equipment:   Intra-op Plan:   Post-operative Plan: Extubation in OR  Informed Consent: I have reviewed the patients History and Physical, chart, labs and discussed the procedure including the risks, benefits and alternatives for the proposed anesthesia with the patient or authorized representative who has indicated his/her understanding and acceptance.     Plan Discussed with: CRNA and Surgeon  Anesthesia Plan Comments:         Anesthesia Quick Evaluation

## 2012-05-29 NOTE — Progress Notes (Signed)
Hypoglycemic Event  CBG: 57  Treatment: 15 GM carbohydrate snack  Symptoms: None  Follow-up CBG: Time:1629 CBG Result:55  Possible Reasons for Event: Inadequate meal intake  Comments/MD notified:   Samuel Germany, Dartha Lodge  Remember to initiate Hypoglycemia Order Set & complete

## 2012-05-29 NOTE — Anesthesia Postprocedure Evaluation (Signed)
  Anesthesia Post-op Note  Patient: Lawrence [name]  Procedure(s) Performed: Procedure(s) with comments: IRRIGATION AND DEBRIDEMENT EXTREMITY- right foot (Right) - Right Foot Debridement, VAC Application  Patient Location: PACU  Anesthesia Type:General  Level of Consciousness: awake, oriented and patient cooperative  Airway and Oxygen Therapy: Patient Spontanous Breathing  Post-op Pain: mild  Post-op Assessment: Post-op Vital signs reviewed, Patient's Cardiovascular Status Stable, Respiratory Function Stable, Patent Airway, No signs of Nausea or vomiting and Pain level controlled  Post-op Vital Signs: stable  Complications: No apparent anesthesia complications

## 2012-05-30 ENCOUNTER — Encounter (HOSPITAL_COMMUNITY): Payer: Self-pay | Admitting: Orthopaedic Surgery

## 2012-05-30 DIAGNOSIS — T8140XA Infection following a procedure, unspecified, initial encounter: Secondary | ICD-10-CM

## 2012-05-30 DIAGNOSIS — M869 Osteomyelitis, unspecified: Secondary | ICD-10-CM

## 2012-05-30 DIAGNOSIS — I96 Gangrene, not elsewhere classified: Secondary | ICD-10-CM

## 2012-05-30 DIAGNOSIS — E1169 Type 2 diabetes mellitus with other specified complication: Secondary | ICD-10-CM

## 2012-05-30 LAB — GLUCOSE, CAPILLARY
Glucose-Capillary: 84 mg/dL (ref 70–99)
Glucose-Capillary: 38 mg/dL — CL (ref 70–99)

## 2012-05-30 MED ORDER — PIPERACILLIN-TAZOBACTAM 3.375 G IVPB
3.3750 g | Freq: Three times a day (TID) | INTRAVENOUS | Status: DC
  Administered 2012-05-30 – 2012-05-31 (×4): 3.375 g via INTRAVENOUS
  Filled 2012-05-30 (×6): qty 50

## 2012-05-30 MED ORDER — DEXTROSE 50 % IV SOLN
INTRAVENOUS | Status: AC
  Administered 2012-05-30: 50 mL
  Filled 2012-05-30: qty 50

## 2012-05-30 MED ORDER — SODIUM CHLORIDE 0.9 % IJ SOLN
10.0000 mL | INTRAMUSCULAR | Status: DC | PRN
  Administered 2012-05-31 – 2012-06-02 (×3): 10 mL

## 2012-05-30 MED ORDER — DEXTROSE 50 % IV SOLN: 50.0000 mL | Freq: Once | INTRAVENOUS | Status: AC | PRN

## 2012-05-30 MED ORDER — INSULIN ASPART 100 UNIT/ML ~~LOC~~ SOLN
0.0000 [IU] | Freq: Three times a day (TID) | SUBCUTANEOUS | Status: DC
  Administered 2012-06-01: 1 [IU] via SUBCUTANEOUS
  Administered 2012-06-01 – 2012-06-04 (×5): 2 [IU] via SUBCUTANEOUS
  Administered 2012-06-04: 1 [IU] via SUBCUTANEOUS
  Administered 2012-06-05 – 2012-06-06 (×3): 2 [IU] via SUBCUTANEOUS

## 2012-05-30 NOTE — Progress Notes (Signed)
Peripherally Inserted Central Catheter/Midline Placement  The IV Nurse has discussed with the patient and/or persons authorized to consent for the patient, the purpose of this procedure and the potential benefits and risks involved with this procedure.  The benefits include less needle sticks, lab draws from the catheter and patient may be discharged home with the catheter.  Risks include, but not limited to, infection, bleeding, blood clot (thrombus formation), and puncture of an artery; nerve damage and irregular heat beat.  Alternatives to this procedure were also discussed.  PICC/Midline Placement Documentation        Lawrence [name] 05/30/2012, 9:54 AM

## 2012-05-30 NOTE — Consult Note (Signed)
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 Ophelia Charter   HPI: Lawrence [name] is an 52 y.o. male. Who presented with gangrenous toe to [facility] service in March and underwent 1st ray amputation by Dr. Ophelia Charter at that time [id number]. He was on abx for 3 days post surgery and then dc to home. Purulent drainage from this wound with necrosis of part of the edge. MRI done over the weekend showed residual evidence of  osteomyelitis in the distal  the second metatarsal and proximal 1.5 cm of the proximal phalanx of the second toe along withfindings worrisome for  Osteomyelitis in the distal remaining first metatarsal. Patient had in the interim been on doxycycline which she took for admission. Yesterday the patient was taken operating room by Dr. Kevan Ny who performed revison of the first ray amputation with cultures taken. The 2nd toe did not have purulent material visible in the OR and did not appear to Dr Ophelia Charter to have evidence of osteomyeltis in the OR. Pt has been on vancomycin and zosyn postoperatively and has PICC line in place for home IV abx.  Past Medical History  Diagnosis Date  . Hypertension   . Chronic kidney disease   . Hypercholesteremia   . Diabetes mellitus     insulin dependent    Past Surgical History  Procedure Laterality Date  . Amputation Right 05/03/2012    Procedure: Right First Ray AMPUTATION;  Surgeon: Eldred Manges, MD;  Location: [facility] OR;  Service: Orthopedics;  Laterality: Right;  . I&d extremity Right 05/29/2012    Procedure: IRRIGATION AND DEBRIDEMENT EXTREMITY- right foot;  Surgeon: Eldred Manges, MD;  Location: [facility] OR;  Service: Orthopedics;  Laterality: Right;  Right Foot Debridement, VAC Application  . Toe amputation Right 05/29/2012    1ST  ray      Dr Flavia Shipper:   No Known Allergies   Medications: I have reviewed patients  current medications as documented in Epic Anti-infectives   Start     Dose/Rate Route Frequency Ordered Stop   05/30/12 0600  piperacillin-tazobactam (ZOSYN) IVPB 3.375 g     3.375 g 12.5 mL/hr over 240 Minutes Intravenous Every 8 hours 05/30/12 0232     05/29/12 1800  vancomycin (VANCOCIN) 1,750 mg in sodium chloride 0.9 % 500 mL IVPB     1,750 mg 250 mL/hr over 120 Minutes Intravenous Every 24 hours 05/29/12 1611     05/29/12 1600  piperacillin-tazobactam (ZOSYN) IVPB 3.375 g  Status:  Discontinued     3.375 g 100 mL/hr over 30 Minutes Intravenous 3 times per day 05/29/12 1545 05/30/12 0232   05/29/12 1044  ceFAZolin (ANCEF) 2-3 GM-% IVPB SOLR    Comments:  KENSMOE, KATHERINE: cabinet override      05/29/12 1044 05/29/12 1245   05/29/12 1038  ceFAZolin (ANCEF) IVPB 2 g/50 mL premix  Status:  Discontinued     2 g 100 mL/hr over 30 Minutes Intravenous On call to O.R. 05/29/12 1038 05/29/12 1517      Social History:  reports that he has never smoked. He has never used smokeless tobacco. He reports that he does not drink alcohol or use illicit drugs.  History reviewed. No pertinent family history.  As in HPI and primary teams notes otherwise 12 point review of systems is negative  Blood pressure 139/75, pulse  91, temperature 99.5 F (37.5 C), resp. rate 18, height 5' 4.96" (1.65 m), weight 192 lb 0.3 oz (87.1 kg), SpO2 100.00%. General: Alert and awake, oriented x3, not in any acute distress. HEENT: anicteric sclera, pupils reactive to light and accommodation, EOMI, oropharynx clear and without exudate CVS regular rate, normal r,  no murmur rubs or gallops Chest: clear to auscultation bilaterally, no wheezing, rales or rhonchi Abdomen: soft nontender, nondistended, normal bowel sounds, Extremities: right foot with bandage, left foot without lesions  Neuro: nonfocal,   Results for orders placed during the hospital encounter of 05/29/12 (from the past 48 hour(s))  GLUCOSE,  CAPILLARY     Status: Abnormal   Collection Time    05/29/12 10:53 AM      Result Value Range   Glucose-Capillary 44 (*) 70 - 99 mg/dL  GLUCOSE, CAPILLARY     Status: Abnormal   Collection Time    05/29/12 11:26 AM      Result Value Range   Glucose-Capillary 129 (*) 70 - 99 mg/dL  GLUCOSE, CAPILLARY     Status: Abnormal   Collection Time    05/29/12 11:58 AM      Result Value Range   Glucose-Capillary 100 (*) 70 - 99 mg/dL  GLUCOSE, CAPILLARY     Status: None   Collection Time    05/29/12 12:26 PM      Result Value Range   Glucose-Capillary 89  70 - 99 mg/dL  CULTURE, ROUTINE-ABSCESS     Status: None   Collection Time    05/29/12  1:05 PM      Result Value Range   Specimen Description ABSCESS RIGHT FOOT     Special Requests PATIENT ON FOLLOWING ANCEF     Gram Stain       Value: ABUNDANT WBC PRESENT, PREDOMINANTLY PMN     NO SQUAMOUS EPITHELIAL CELLS SEEN     NO ORGANISMS SEEN   Culture NO GROWTH 1 DAY     Report Status PENDING    ANAEROBIC CULTURE     Status: None   Collection Time    05/29/12  1:05 PM      Result Value Range   Specimen Description ABSCESS RIGHT FOOT     Special Requests PATIENT ON FOLLOWING ANCEF     Gram Stain       Value: ABUNDANT WBC PRESENT, PREDOMINANTLY PMN     NO SQUAMOUS EPITHELIAL CELLS SEEN     NO ORGANISMS SEEN   Culture       Value: NO ANAEROBES ISOLATED; CULTURE IN PROGRESS FOR 5 DAYS   Report Status PENDING    GLUCOSE, CAPILLARY     Status: None   Collection Time    05/29/12  1:52 PM      Result Value Range   Glucose-Capillary 99  70 - 99 mg/dL  HEMOGLOBIN Z6X     Status: Abnormal   Collection Time    05/29/12  4:04 PM      Result Value Range   Hemoglobin A1C 8.6 (*) <5.7 %   Comment: (NOTE)  According to the ADA Clinical Practice Recommendations for 2011, when     HbA1c is used as a screening test:      >=6.5%   Diagnostic of Diabetes Mellitus                (if abnormal result is confirmed)     5.7-6.4%   Increased risk of developing Diabetes Mellitus     References:Diagnosis and Classification of Diabetes Mellitus,Diabetes     Care,2011,34(Suppl 1):S62-S69 and Standards of Medical Care in             Diabetes - 2011,Diabetes Care,2011,34 (Suppl 1):S11-S61.   Mean Plasma Glucose 200 (*) <117 mg/dL  GLUCOSE, CAPILLARY     Status: Abnormal   Collection Time    05/29/12  4:07 PM      Result Value Range   Glucose-Capillary 57 (*) 70 - 99 mg/dL   Comment 1 Notify RN    GLUCOSE, CAPILLARY     Status: Abnormal   Collection Time    05/29/12  4:29 PM      Result Value Range   Glucose-Capillary 55 (*) 70 - 99 mg/dL   Comment 1 Notify RN    GLUCOSE, CAPILLARY     Status: Abnormal   Collection Time    05/29/12  5:11 PM      Result Value Range   Glucose-Capillary 58 (*) 70 - 99 mg/dL   Comment 1 Notify RN    GLUCOSE, CAPILLARY     Status: Abnormal   Collection Time    05/29/12  6:21 PM      Result Value Range   Glucose-Capillary 146 (*) 70 - 99 mg/dL   Comment 1 Notify RN    GLUCOSE, CAPILLARY     Status: Abnormal   Collection Time    05/29/12 11:03 PM      Result Value Range   Glucose-Capillary 69 (*) 70 - 99 mg/dL  GLUCOSE, CAPILLARY     Status: None   Collection Time    05/30/12 12:37 AM      Result Value Range   Glucose-Capillary 75  70 - 99 mg/dL  GLUCOSE, CAPILLARY     Status: Abnormal   Collection Time    05/30/12  6:39 AM      Result Value Range   Glucose-Capillary 177 (*) 70 - 99 mg/dL  GLUCOSE, CAPILLARY     Status: None   Collection Time    05/30/12 11:15 AM      Result Value Range   Glucose-Capillary 84  70 - 99 mg/dL   Comment 1 Notify RN    GLUCOSE, CAPILLARY     Status: Abnormal   Collection Time    05/30/12  4:26 PM      Result Value Range   Glucose-Capillary 51 (*) 70 - 99 mg/dL   Comment 1 Notify RN    GLUCOSE, CAPILLARY     Status: Abnormal   Collection Time    05/30/12  5:07 PM      Result Value  Range   Glucose-Capillary 38 (*) 70 - 99 mg/dL   Comment 1 Notify RN        Component Value Date/Time   SDES ABSCESS RIGHT FOOT 05/29/2012 1305   SDES ABSCESS RIGHT FOOT 05/29/2012 1305   SPECREQUEST PATIENT ON FOLLOWING ANCEF 05/29/2012 1305   SPECREQUEST PATIENT ON FOLLOWING ANCEF 05/29/2012 1305   CULT NO GROWTH 1 DAY 05/29/2012 1305   CULT NO ANAEROBES ISOLATED; CULTURE IN PROGRESS FOR  5 DAYS 05/29/2012 1305   REPTSTATUS PENDING 05/29/2012 1305   REPTSTATUS PENDING 05/29/2012 1305   No results found.   Recent Results (from the past 720 hour(s))  SURGICAL PCR SCREEN     Status: Abnormal   Collection Time    05/03/12  5:19 AM      Result Value Range Status   MRSA, PCR NEGATIVE  NEGATIVE Final   Staphylococcus aureus POSITIVE (*) NEGATIVE Final   Comment:            The Xpert SA Assay (FDA     approved for NASAL specimens     in patients over 63 years of age),     is one component of     a comprehensive surveillance     program.  Test performance has     been validated by [employer] for patients greater     than or equal to 40 year old.     It is not intended     to diagnose infection nor to     guide or monitor treatment.  SURGICAL PCR SCREEN     Status: None   Collection Time    05/24/12  2:38 PM      Result Value Range Status   MRSA, PCR NEGATIVE  NEGATIVE Final   Staphylococcus aureus NEGATIVE  NEGATIVE Final   Comment:            The Xpert SA Assay (FDA     approved for NASAL specimens     in patients over 50 years of age),     is one component of     a comprehensive surveillance     program.  Test performance has     been validated by [employer] for patients greater     than or equal to 28 year old.     It is not intended     to diagnose infection nor to     guide or monitor treatment.  CULTURE, ROUTINE-ABSCESS     Status: None   Collection Time    05/29/12  1:05 PM      Result Value Range Status   Specimen Description ABSCESS RIGHT FOOT   Final     Special Requests PATIENT ON FOLLOWING ANCEF   Final   Gram Stain     Final   Value: ABUNDANT WBC PRESENT, PREDOMINANTLY PMN     NO SQUAMOUS EPITHELIAL CELLS SEEN     NO ORGANISMS SEEN   Culture NO GROWTH 1 DAY   Final   Report Status PENDING   Incomplete  ANAEROBIC CULTURE     Status: None   Collection Time    05/29/12  1:05 PM      Result Value Range Status   Specimen Description ABSCESS RIGHT FOOT   Final   Special Requests PATIENT ON FOLLOWING ANCEF   Final   Gram Stain     Final   Value: ABUNDANT WBC PRESENT, PREDOMINANTLY PMN     NO SQUAMOUS EPITHELIAL CELLS SEEN     NO ORGANISMS SEEN   Culture     Final   Value: NO ANAEROBES ISOLATED; CULTURE IN PROGRESS FOR 5 DAYS   Report Status PENDING   Incomplete     Impression/Recommendation  52 year old with gangrenous first toe sp first Ray amputation with dehiscence and persistence of infection at this site and by MRI ? Of infection of the 2nd toe,  sp further debridement of the first ray amputation  #1 Osteomyelitis of first ray amputation site, ? 2nd toe amputation (Dr. Ophelia Charter indicated in op note he did not feel this appeared to be osteo in the OR  --fu cultures --will craft a regimen for home based on cultures, if cutures negative will consider broad spectrum regimen (pt did receive doxy po prior to surgery and ancef en route to the OR? Will ask pharmacy to check this --check ESR, CRp  #2 Screening: check hiv  Thank you so much for this interesting consult  [facility] [phone] (pager) [phone] (office) 05/30/2012, 5:42 PM  Paulette Blanch Dam 05/30/2012, 5:42 PM

## 2012-05-30 NOTE — Op Note (Signed)
Lawrence [name], Lawrence [name]                 ACCOUNT NO.:  [id number]  MEDICAL RECORD NO.:  [id number]  LOCATION:  [id number]                        FACILITY:  [facility]  PHYSICIAN:  Tsuneo C. Ophelia Charter, M.D.    DATE OF BIRTH:  [birth year]  DATE OF PROCEDURE:  05/29/2012 DATE OF DISCHARGE:                              OPERATIVE REPORT   PREOPERATIVE DIAGNOSIS:  Right 1st ray amputation dehiscence, uncontrolled diabetes and MRI suggesting osteomyelitis possibly in the distal first metatarsal shaft and possible second metatarsal shaft and second toe proximal phalanx.  POSTOPERATIVE DIAGNOSIS:  Right 1st ray amputation dehiscence, uncontrolled diabetes and MRI suggesting osteomyelitis possibly in the distal first metatarsal shaft and possible second metatarsal shaft and second toe proximal phalanx.  PROCEDURE:  Right foot wound dehiscence excisional sharp scalpel debridement of skin, subcutaneous tissue, muscle fascia.  Revision first ray amputation with shortening of the first ray and VAC assisted closure using small VAC at 100 mm pressure, constant suction.  SURGEON:  Rodrickus C. Ophelia Charter, M.D.  TOURNIQUET TIME:  20 minutes.  BRIEF HISTORY:  This 52 year old male with type 2 diabetic on long-term insulin has had relatively poor control until just recently.  He had problems with great toe gangrene necrosis, drainage with infection, underwent a first ray amputation.  Postop, his wound was looking good. Until 2 weeks postop, he started developing some serous drainage edge necrosis of the skin.  He had palpable pulses, but has microcirculatory disease.  MRI scan done the last few days demonstrated changes in the first ray shaft consistent with possible residual osteomyelitis, some fluid collection possibly related abscess and changes and 2nd metatarsal shaft and also the proximal phalanx of the 2nd toe without definite infection of the second MTP joint.  He is admitted at this time for wound debridement for wound  dehiscence and revision as needed and VAC placement.  The patient had initial blood sugar was in the 40s, repeat after some IV solution with was around 80.  The patient received 2 g Ancef prophylactically.  He had been on doxycycline preoperatively p.o.  DESCRIPTION OF PROCEDURE:  He was taken operating room where intubation was difficult and GlideScope was used for intubation.  He had a type 4 anterior position trachea.  Once he was intubated using a glide scope, proximal thigh tourniquet was applied.  The leg was prepped up to the October up to the 10/10 drape or 10/15 drape had been applied midcalf and after removing the dressing and DuraPrep.  The patient had wound dehiscence edge necrosis extending up to 8 mm along the edge.  There was thick callus with dermal peeling and after time-out procedure, debridement of the eschar was performed.  Sharp scalpel debridement of skin, subcutaneous tissue, and some muscle of the adductor was performed with the #10 and #15 scalpel blade.  Rongeurs were used for debridement of subcutaneous tissue.  Tan and black appearing tissue was sharply debrided.  Tourniquet was initially not used until debridement down to the second toe capsule and metatarsal shaft.  The tip of Adson pickups were pushed into the cancellous bone at the level of the amputation bone appeared slightly soft.  There was no changes  on the cortex.  An oscillating saw blade was then selected, leg was elevated, tourniquet was inflated to 350.  A 1 cm revision amputation was performed shortening the first metatarsal at an angle with oscillating saw. Inspection of the second metatarsal showed coverage and pushing and squeezing the tissue and no purulence was noted.  The capsule of the second metatarsal was noted.  It was squeezed, did not appear to be full of fluid and there was no opening present in it.  Toe was flexed and extended.  Soft tissue still covered the metatarsal and it did  not appear that there was any osteomyelitis.  No evidence of elevation of the periosteum.  Continued debridement was performed.  Copious irrigation was used 1 L using bulb syringe repeatedly.  Once continued debridement was performed, the wound was ready for closure with no remaining areas of necrotic skin.  Tourniquet was deflated and there was good bleeding base.  Horizontal mattress sutures was applied on the dorsum interrupted were placed.  With 4-5 sutures were placed, this left the distal aspect extended down the base toe, which was 3 x 4 cm open area with 2 cm of depth.  Small VAC sponge was selected, custom cut fitted, placed with good seal, and low leak rate at 100 cm of pressure continuous.  A 4x4s and Kerlix were applied for postoperative dressing. Leak rate was still low and the patient was transferred to recovery room in stable condition.  He tolerated revision of first ray amputation and sharp excisional debridement of necrotic subcutaneous tissue, skin, fascia, and muscle as well as excisional debridement of bone as planned. He will be treated with IV antibiotics.  VAC changes and careful follow up.     Benno C. Ophelia Charter, M.D.     MCY/MEDQ  D:  05/29/2012  T:  05/30/2012  Job:  [id number]

## 2012-05-30 NOTE — Progress Notes (Signed)
Subjective: 1 Day Post-Op Procedure(s) (LRB): IRRIGATION AND DEBRIDEMENT EXTREMITY- right foot (Right) Patient reports pain as mild.    Objective: Vital signs in last 24 hours: Temp:  [99.3 F (37.4 C)-100.4 F (38 C)] 99.5 F (37.5 C) (04/22 1423) Pulse Rate:  [82-94] 91 (04/22 1423) Resp:  [18] 18 (04/22 1423) BP: (110-167)/(53-79) 139/75 mmHg (04/22 1423) SpO2:  [97 %-100 %] 100 % (04/22 1423) Weight:  [87.1 kg (192 lb 0.3 oz)] 87.1 kg (192 lb 0.3 oz) (04/22 0900)  Intake/Output from previous day: 04/21 0701 - 04/22 0700 In: 1200 [I.V.:1200] Out: -  Intake/Output this shift:    No results found for this basename: HGB,  in the last 72 hours No results found for this basename: WBC, RBC, HCT, PLT,  in the last 72 hours No results found for this basename: NA, K, CL, CO2, BUN, CREATININE, GLUCOSE, CALCIUM,  in the last 72 hours No results found for this basename: LABPT, INR,  in the last 72 hours  vac seal good.   Assessment/Plan: 1 Day Post-Op Procedure(s) (LRB): IRRIGATION AND DEBRIDEMENT EXTREMITY- right foot (Right) Continue ABX therapy due to Post-op infection with diabetes and osteomyelitis.   Duaine Radin,Darryon C 05/30/2012, 6:45 PM

## 2012-05-30 NOTE — Evaluation (Signed)
Physical Therapy Evaluation Patient Details Name: Lawrence [name] MRN: [id number] DOB: 05/24/1960 Today's Date: 05/30/2012 Time: 7846-9629 PT Time Calculation (min): 15 min  PT Assessment / Plan / Recommendation Clinical Impression  Pt is a 52 y.o. 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 Pt presents with limited mobility and indpendence with transfers secodnary to pain and mild balance deficits. Pt to benefit from skilled PT to maximize functional mobility.     PT Assessment  Patient needs continued PT services    Follow Up Recommendations  Supervision - Intermittent    Does the patient have the potential to tolerate intense rehabilitation      Barriers to Discharge        Equipment Recommendations  None recommended by PT    Recommendations for Other Services     Frequency Min 4X/week    Precautions / Restrictions Precautions Precautions: Fall Precaution Comments: wound VAC Required Braces or Orthoses: Other Brace/Splint Other Brace/Splint: Darco Shoe Restrictions Weight Bearing Restrictions: Yes RLE Weight Bearing: Weight bearing as tolerated   Pertinent Vitals/Pain 2/10 with WB on R LE; 0/10 at rest.      Mobility  Bed Mobility Bed Mobility: Not assessed Transfers Transfers: Sit to Stand;Stand to Sit Sit to Stand: 4: Min guard;From chair/3-in-1;With armrests Stand to Sit: 4: Min guard;To chair/3-in-1;With armrests Details for Transfer Assistance: cues for hand placement and safety and to use bil UEs to control descent to chair; has tendency to flop into chair  Ambulation/Gait Ambulation/Gait Assistance: 5: Supervision Ambulation Distance (Feet): 70 Feet Assistive device: Rolling walker Ambulation/Gait Assistance Details: vc's for gt sequencing and RW safety; c/o pain 2/10 with WB on R LE Gait Pattern: Step-to pattern;Decreased step length - left;Decreased stance time - right;Antalgic;Trunk flexed Gait velocity: decreased  Stairs:  No Wheelchair Mobility Wheelchair Mobility: No    Exercises     PT Diagnosis: Difficulty walking;Acute pain  PT Problem List: Decreased strength;Decreased range of motion;Decreased activity tolerance;Decreased balance;Decreased mobility;Decreased knowledge of use of DME;Pain PT Treatment Interventions: DME instruction;Gait training;Stair training;Therapeutic activities;Functional mobility training;Therapeutic exercise;Balance training;Neuromuscular re-education;Patient/family education   PT Goals Acute Rehab PT Goals PT Goal Formulation: With patient Time For Goal Achievement: 06/04/12 Potential to Achieve Goals: Good Pt will go Supine/Side to Sit: Independently PT Goal: Supine/Side to Sit - Progress: Goal set today Pt will Sit at Edge of Bed: Independently PT Goal: Sit at Edge Of Bed - Progress: Goal set today Pt will go Sit to Supine/Side: Independently PT Goal: Sit to Supine/Side - Progress: Goal set today Pt will go Sit to Stand: with modified independence PT Goal: Sit to Stand - Progress: Goal set today Pt will go Stand to Sit: Independently PT Goal: Stand to Sit - Progress: Goal set today Pt will Transfer Bed to Chair/Chair to Bed: with modified independence PT Transfer Goal: Bed to Chair/Chair to Bed - Progress: Goal set today Pt will Ambulate: >150 feet;with modified independence;with rolling walker PT Goal: Ambulate - Progress: Goal set today Pt will Go Up / Down Stairs: 1-2 stairs;with rolling walker;with modified independence PT Goal: Up/Down Stairs - Progress: Goal set today Pt will Perform Home Exercise Program: with supervision, verbal cues required/provided PT Goal: Perform Home Exercise Program - Progress: Goal set today  Visit Information  Last PT Received On: 05/30/12 Assistance Needed: +1    Subjective Data  Subjective: "The pain isnt here yet, my sister. I hope to go home Thursday" Pt sitting in chair; agreeable to  therapy Patient Stated Goal: to go home  Thursday   Prior Functioning  Home Living Lives With: Other (Comment) (lives with family member who is mentally retarded; caregiver) Available Help at Discharge: Family;Other (Comment);Available PRN/intermittently (Sister checks in on him ) Type of Home: House Home Access: Stairs to enter [employer] of Steps: 1 (threshold) Entrance Stairs-Rails: None Home Layout: One level Bathroom Shower/Tub: Forensic scientist: Standard Bathroom Accessibility: No Home Adaptive Equipment: Walker - rolling Additional Comments: has been walking with RW since toe amputation 3 weeks ago; was indpendent without AD prior to surgery Prior Function Level of Independence: Independent with assistive device(s) Able to Take Stairs?: No Driving: Yes (prior to toe amputation 3 weeks ago ) Vocation: Full time employment Communication Communication: No difficulties Dominant Hand: Left    Cognition  Cognition Arousal/Alertness: Awake/alert Behavior During Therapy: WFL for tasks assessed/performed Overall Cognitive Status: Within Functional Limits for tasks assessed    Extremity/Trunk Assessment Right Lower Extremity Assessment RLE ROM/Strength/Tone: Due to pain;Due to precautions;Deficits RLE ROM/Strength/Tone Deficits: knee and hip WFL;unable to assess ankle secondary to procedure and pain  RLE Sensation: WFL - Light Touch Left Lower Extremity Assessment LLE ROM/Strength/Tone: Within functional levels LLE Sensation: WFL - Light Touch Trunk Assessment Trunk Assessment: Kyphotic   Balance Balance Balance Assessed: No  End of Session PT - End of Session Equipment Utilized During Treatment: Gait belt;Other (comment) (Darco shoe) Activity Tolerance: Patient tolerated treatment well Patient left: with call bell/phone within reach;in chair Nurse Communication: Mobility status  GP     Donell Sievert, [place] [phone] 05/30/2012, 12:10 PM

## 2012-05-30 NOTE — Progress Notes (Signed)
Hypoglycemic Event  CBG: 38  Treatment: D50 IV 50 mL  Symptoms: None  Follow-up CBG: Time:1735 CBG Result:136  Possible Reasons for Event: Inadequate meal intake  Comments/MD notified    Lawrence [name]  Remember to initiate Hypoglycemia Order Set & complete

## 2012-05-30 NOTE — Progress Notes (Signed)
Inpatient Diabetes Program Recommendations  AACE/ADA: New Consensus Statement on Inpatient Glycemic Control (2013)  Target Ranges:  Prepandial:   less than 140 mg/dL      Peak postprandial:   less than 180 mg/dL (1-2 hours)      Critically ill patients:  140 - 180 mg/dL   Results for IRFAN, VEAL (MRN [id number]) as of 05/30/2012 10:24  Ref. Range 05/07/2012 07:49 05/29/2012 10:53 05/29/2012 11:26 05/29/2012 11:58 05/29/2012 12:26 05/29/2012 13:52 05/29/2012 16:07 05/29/2012 16:29 05/29/2012 17:11 05/29/2012 18:21 05/29/2012 23:03 05/30/2012 00:37 05/30/2012 06:39  Glucose-Capillary Latest Range: 70-99 mg/dL 409 (H) 44 (LL) 811 (H) 100 (H) 89 99 57 (L) 55 (L) 58 (L) 146 (H) 69 (L) 75 177 (H)    Inpatient Diabetes Program Recommendations Oral Agents: Please consider discontinuing Glipizide while inpatient.  Per ADA, oral agents are not recommended in the acute care setting.  It is recommended that inpatient glycemic control be managed with insulin since they are more predictable than oral agents.  Note: Patient has experienced 3 hypoglycemic events over the past 24 hours.  Hypoglycemia is noted to be a prominent side effect of Glipizide and the ADA does not recommend using oral agents in the acute care setting.  Please consider discontinuing Glipizide while inpatient and manage diabetes with insulin.  May need to decrease Novolog correction to sensitive if Glipizide is discontinued and hypoglycemia persist.  Will continue to follow.  Thanks, Orlando Penner, RN, BSN, CCRN Diabetes Coordinator Inpatient Diabetes Program [phone]

## 2012-05-30 NOTE — Progress Notes (Signed)
Hypoglycemic Event  CBG: 51  Treatment: 15 GM carbohydrate snack  Symptoms: None  Follow-up CBG: Time1700 CBG Result 38  Possible Reasons for Event: Inadequate meal intake  Comments/MD notified:    Lawrence [name]  Remember to initiate Hypoglycemia Order Set & complete

## 2012-05-30 NOTE — Consult Note (Addendum)
WOC consult Note Reason for Consult: Consult requested to assist with first post-op vac dressing change on Wed.  Vac intact with good seal to right foot at cont suction.  Plan to reapply vac dressing after ortho service assesses wound tomorrow with first post-op dressing change. Cammie Mcgee MSN, RN, CWOCN, [place], CNS [phone]

## 2012-05-31 DIAGNOSIS — T8740 Infection of amputation stump, unspecified extremity: Secondary | ICD-10-CM

## 2012-05-31 DIAGNOSIS — Y849 Medical procedure, unspecified as the cause of abnormal reaction of the patient, or of later complication, without mention of misadventure at the time of the procedure: Secondary | ICD-10-CM

## 2012-05-31 DIAGNOSIS — N289 Disorder of kidney and ureter, unspecified: Secondary | ICD-10-CM

## 2012-05-31 LAB — BASIC METABOLIC PANEL
BUN: 33 mg/dL — ABNORMAL HIGH (ref 6–23)
CO2: 28 mEq/L (ref 19–32)
Chloride: 106 mEq/L (ref 96–112)
GFR calc Af Amer: 23 mL/min — ABNORMAL LOW (ref >90)
Glucose, Bld: 67 mg/dL — ABNORMAL LOW (ref 70–99)
Potassium: 3.6 mEq/L (ref 3.5–5.1)

## 2012-05-31 LAB — URINALYSIS, ROUTINE W REFLEX MICROSCOPIC
Glucose, UA: NEGATIVE mg/dL
Hgb urine dipstick: NEGATIVE
Leukocytes, UA: NEGATIVE
Specific Gravity, Urine: 1.014 (ref 1.005–1.030)
pH: 5 (ref 5.0–8.0)

## 2012-05-31 LAB — URINE MICROSCOPIC-ADD ON

## 2012-05-31 LAB — GLUCOSE, CAPILLARY
Glucose-Capillary: 88 mg/dL (ref 70–99)
Glucose-Capillary: 125 mg/dL — ABNORMAL HIGH (ref 70–99)
Glucose-Capillary: 112 mg/dL — ABNORMAL HIGH (ref 70–99)
Glucose-Capillary: 102 mg/dL — ABNORMAL HIGH (ref 70–99)
Glucose-Capillary: 125 mg/dL — ABNORMAL HIGH (ref 70–99)

## 2012-05-31 LAB — C-REACTIVE PROTEIN: CRP: 3.6 mg/dL — ABNORMAL HIGH (ref <0.60)

## 2012-05-31 MED ORDER — METOCLOPRAMIDE HCL 10 MG PO TABS
10.0000 mg | ORAL_TABLET | Freq: Three times a day (TID) | ORAL | Status: DC
  Administered 2012-05-31 – 2012-06-06 (×19): 10 mg via ORAL
  Filled 2012-05-31 (×23): qty 1

## 2012-05-31 MED ORDER — INSULIN GLARGINE 100 UNIT/ML ~~LOC~~ SOLN
15.0000 [IU] | Freq: Every day | SUBCUTANEOUS | Status: DC
Start: 2012-05-31 — End: 2012-06-06
  Administered 2012-06-01 – 2012-06-05 (×4): 15 [IU] via SUBCUTANEOUS
  Filled 2012-05-31 (×7): qty 0.15

## 2012-05-31 MED ORDER — DEXTROSE 50 % IV SOLN: 50.0000 mL | Freq: Once | INTRAVENOUS | Status: AC | PRN

## 2012-05-31 MED ORDER — METOCLOPRAMIDE HCL 5 MG/ML IJ SOLN
5.0000 mg | Freq: Three times a day (TID) | INTRAMUSCULAR | Status: DC
  Filled 2012-05-31 (×18): qty 2

## 2012-05-31 MED ORDER — SODIUM CHLORIDE 0.9 % IV SOLN
400.0000 mg | Freq: Two times a day (BID) | INTRAVENOUS | Status: DC
  Administered 2012-05-31 – 2012-06-01 (×2): 400 mg via INTRAVENOUS
  Filled 2012-05-31 (×3): qty 400

## 2012-05-31 MED ORDER — METRONIDAZOLE 500 MG PO TABS
500.0000 mg | ORAL_TABLET | Freq: Three times a day (TID) | ORAL | Status: DC
  Administered 2012-05-31 – 2012-06-06 (×18): 500 mg via ORAL
  Filled 2012-05-31 (×21): qty 1

## 2012-05-31 NOTE — Consult Note (Signed)
Wound care follow-up: Dr Ophelia Charter in earlier to assess right foot for first post-op dressing change.  Vac dressing applied to right foot where great toe was amputated.  Full thickness post-op incision, 3X2X.8cm.  90% red, 10% yellow, small pink drainage, no odor.  Sutures surrounding wound bed.  Applied one piece black foam and bridged trackpad to anterior foot to cont suction.  Pt tolerated without c/o pain or bleeding.  Plan for bedside nurse to change dressing Friday. Cammie Mcgee MSN, RN, CWOCN, [place], CNS [phone]

## 2012-05-31 NOTE — Progress Notes (Signed)
[facility]  Day # 3 vancomycin Day # 3 zosyn  Subjective: Nausea slightly better, cr up   Antibiotics:  Anti-infectives   Start     Dose/Rate Route Frequency Ordered Stop   05/31/12 1400  ceftaroline (TEFLARO) 400 mg in sodium chloride 0.9 % 250 mL IVPB     400 mg 250 mL/hr over 60 Minutes Intravenous Every 12 hours 05/31/12 1252     05/31/12 1400  metroNIDAZOLE (FLAGYL) tablet 500 mg     500 mg Oral 3 times per day 05/31/12 1252     05/30/12 0600  piperacillin-tazobactam (ZOSYN) IVPB 3.375 g  Status:  Discontinued     3.375 g 12.5 mL/hr over 240 Minutes Intravenous Every 8 hours 05/30/12 0232 05/31/12 1252   05/29/12 1800  vancomycin (VANCOCIN) 1,750 mg in sodium chloride 0.9 % 500 mL IVPB  Status:  Discontinued     1,750 mg 250 mL/hr over 120 Minutes Intravenous Every 24 hours 05/29/12 1611 05/31/12 0921   05/29/12 1600  piperacillin-tazobactam (ZOSYN) IVPB 3.375 g  Status:  Discontinued     3.375 g 100 mL/hr over 30 Minutes Intravenous 3 times per day 05/29/12 1545 05/30/12 0232   05/29/12 1044  ceFAZolin (ANCEF) 2-3 GM-% IVPB SOLR    Comments:  KENSMOE, KATHERINE: cabinet override      05/29/12 1044 05/29/12 1245   05/29/12 1038  ceFAZolin (ANCEF) IVPB 2 g/50 mL premix  Status:  Discontinued     2 g 100 mL/hr over 30 Minutes Intravenous On call to O.R. 05/29/12 1038 05/29/12 1517      Medications: Scheduled Meds: . aspirin  81 mg Oral q morning - 10a  . ceFTAROline (TEFLARO) IV  400 mg Intravenous Q12H  . docusate sodium  100 mg Oral BID  . insulin aspart  0-5 Units Subcutaneous QHS  . insulin aspart  0-9 Units Subcutaneous TID WC  . insulin glargine  15 Units Subcutaneous QHS  . metoCLOPramide  10 mg Oral TID AC   Or  . metoCLOPramide (REGLAN) injection  5-10 mg Intravenous TID AC  . metoprolol tartrate  25 mg Oral BID  . metroNIDAZOLE  500 mg Oral Q8H  . pantoprazole  40 mg Oral Daily   Continuous Infusions: . sodium chloride      . lactated ringers 50 mL/hr at 05/29/12 1109   PRN Meds:.bisacodyl, dextrose, diphenhydrAMINE, HYDROcodone-acetaminophen, morphine injection, ondansetron (ZOFRAN) IV, ondansetron, oxyCODONE-acetaminophen, polyethylene glycol, sodium chloride   Objective: Weight change:   Intake/Output Summary (Last 24 hours) at 05/31/12 2017 Last data filed at 05/31/12 1406  Gross per 24 hour  Intake    960 ml  Output      0 ml  Net    960 ml   Blood pressure 140/70, pulse 81, temperature 98 F (36.7 C), temperature source Oral, resp. rate 20, height 5' 4.96" (1.65 m), weight 192 lb 0.3 oz (87.1 kg), SpO2 100.00%. Temp:  [98 F (36.7 C)-98.8 F (37.1 C)] 98 F (36.7 C) (04/23 1405) Pulse Rate:  [77-99] 81 (04/23 1405) Resp:  [18-20] 20 (04/23 1405) BP: (123-143)/(58-70) 140/70 mmHg (04/23 1405) SpO2:  [96 %-100 %] 100 % (04/23 1405)  Physical Exam: General: Alert and awake, oriented x3, not in any acute distress.  HEENT: anicteric sclera, pupils reactive to light and accommodation, EOMI, oropharynx clear and without exudate  CVS regular rate, normal r, no murmur rubs or gallops  Chest: clear to auscultation bilaterally, no wheezing, rales or rhonchi  Abdomen: soft nontender, nondistended, normal bowel sounds,  Extremities: right foot with bandage, left foot without lesions   Lab Results: No results found for this basename: WBC, HGB, HCT, PLT,  in the last 72 hours  BMET  Recent Labs  05/31/12 0500  NA 143  K 3.6  CL 106  CO2 28  GLUCOSE 67*  BUN 33*  CREATININE 3.33*  CALCIUM 8.5    Micro Results: Recent Results (from the past 240 hour(s))  SURGICAL PCR SCREEN     Status: None   Collection Time    05/24/12  2:38 PM      Result Value Range Status   MRSA, PCR NEGATIVE  NEGATIVE Final   Staphylococcus aureus NEGATIVE  NEGATIVE Final   Comment:            The Xpert SA Assay (FDA     approved for NASAL specimens     in patients over 39 years of age),     is one component  of     a comprehensive surveillance     program.  Test performance has     been validated by [employer] for patients greater     than or equal to 40 year old.     It is not intended     to diagnose infection nor to     guide or monitor treatment.  CULTURE, ROUTINE-ABSCESS     Status: None   Collection Time    05/29/12  1:05 PM      Result Value Range Status   Specimen Description ABSCESS RIGHT FOOT   Final   Special Requests PATIENT ON FOLLOWING ANCEF   Final   Gram Stain     Final   Value: ABUNDANT WBC PRESENT, PREDOMINANTLY PMN     NO SQUAMOUS EPITHELIAL CELLS SEEN     NO ORGANISMS SEEN   Culture Culture reincubated for better growth   Final   Report Status PENDING   Incomplete  ANAEROBIC CULTURE     Status: None   Collection Time    05/29/12  1:05 PM      Result Value Range Status   Specimen Description ABSCESS RIGHT FOOT   Final   Special Requests PATIENT ON FOLLOWING ANCEF   Final   Gram Stain     Final   Value: ABUNDANT WBC PRESENT, PREDOMINANTLY PMN     NO SQUAMOUS EPITHELIAL CELLS SEEN     NO ORGANISMS SEEN   Culture     Final   Value: NO ANAEROBES ISOLATED; CULTURE IN PROGRESS FOR 5 DAYS   Report Status PENDING   Incomplete    Studies/Results: No results found.    Assessment/Plan: SHAMON COTHRAN is a 51 y.o. male with  with gangrenous first toe sp first Ray amputation with dehiscence and persistence of infection at this site and by MRI ? Of infection of the 2nd toe, sp further debridement of the first ray amputation . 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 2nd toe amputation (Dr. Ophelia Charter indicated in op note he did not feel this appeared to be osteo in the OR  --dc vancomycin and zosyn --change to IV teflaro (Excellent MRSA coverage and only sig hole in coverage is pseudomonas), renally dosed Plus flagyl orally for anerobic coverage --fu cultures   --check ESR=110  #2  Screening:  HIV neg  #3 Renal insufficiency:  cr up, agree dc vanco, change to teflaro for MRSA coverage   LOS: 2 days   Acey Lav 05/31/2012, 8:17 PM

## 2012-05-31 NOTE — Progress Notes (Signed)
Physical Therapy Treatment Patient Details Name: Lawrence [name] MRN: [id number] DOB: February 23, 1960 Today's Date: 05/31/2012 Time: 4098-1191 PT Time Calculation (min): 24 min  PT Assessment / Plan / Recommendation Comments on Treatment Session  Pt moving well. Denies need to practice steps for safe D/C home. No c/o pain with amb today. Progressing towards goals. Anticpiate D/C home with intermittent family care when medically stable. Pt denies need for HHPT.     Follow Up Recommendations  Supervision - Intermittent     Does the patient have the potential to tolerate intense rehabilitation     Barriers to Discharge        Equipment Recommendations  None recommended by PT    Recommendations for Other Services    Frequency Min 4X/week   Plan Discharge plan remains appropriate;Frequency remains appropriate    Precautions / Restrictions Precautions Precautions: Fall Precaution Comments: wound VAC Required Braces or Orthoses: Other Brace/Splint Other Brace/Splint: Darco Shoe Restrictions Weight Bearing Restrictions: Yes RLE Weight Bearing: Weight bearing as tolerated   Pertinent Vitals/Pain Denies pain. C/o dizziness and "low blood sugar" while sitting EOB; Tech checked blood sugar and reported it to be 110.    Mobility  Bed Mobility Bed Mobility: Supine to Sit;Sitting - Scoot to Edge of Bed;Sit to Supine Supine to Sit: 4: Min guard;With rails;HOB elevated Sitting - Scoot to Edge of Bed: 4: Min guard;With rail Sit to Supine: 4: Min assist;With rail Details for Bed Mobility Assistance: min (A) required to advance R LE onto bed; vc's for hand placement and sequencing ; pt c/o dizziness and felt as though his blood sugar needed to be check; Tech checked it and it was 110; pt stated dizziness subsided ~3 min sitting EOB.  Transfers Transfers: Sit to Stand;Stand to Sit Sit to Stand: 5: Supervision;From bed Stand to Sit: 5: Supervision;To bed Details for Transfer Assistance: required  increase time and vc's <10% of time for hand placement and safety with RW  Ambulation/Gait Ambulation/Gait Assistance: 5: Supervision Ambulation Distance (Feet): 100 Feet Assistive device: Rolling walker Ambulation/Gait Assistance Details: vc's for gt sequencing and safety with RW; no c/o pain today with amb; encouraged to equalize steps; unable to equalize WB on R LE; required 1 standing rest break secondary to arm fatigue  Gait Pattern: Step-to pattern;Decreased step length - left;Decreased stance time - right;Antalgic;Trunk flexed Gait velocity: decreased  Stairs: No Wheelchair Mobility Wheelchair Mobility: No    Exercises     PT Diagnosis:    PT Problem List:   PT Treatment Interventions:     PT Goals Acute Rehab PT Goals PT Goal Formulation: With patient Time For Goal Achievement: 06/04/12 Potential to Achieve Goals: Good PT Goal: Supine/Side to Sit - Progress: Progressing toward goal PT Goal: Sit at Edge Of Bed - Progress: Progressing toward goal PT Goal: Sit to Supine/Side - Progress: Progressing toward goal PT Goal: Sit to Stand - Progress: Progressing toward goal PT Goal: Stand to Sit - Progress: Progressing toward goal PT Transfer Goal: Bed to Chair/Chair to Bed - Progress: Progressing toward goal PT Goal: Ambulate - Progress: Progressing toward goal  Visit Information  Last PT Received On: 05/31/12 Assistance Needed: +1    Subjective Data  Subjective: lying supine; agreeable to therapy; "no pain here today. I doubt ill go home tomorrow but I hope to." Patient Stated Goal: to go home Thursday   Cognition  Cognition Arousal/Alertness: Awake/alert Behavior During Therapy: [facility] for tasks assessed/performed Overall Cognitive Status: Within Functional Limits for tasks assessed  Balance  Balance Balance Assessed: No  End of Session PT - End of Session Equipment Utilized During Treatment: Gait belt;Other (comment) Activity Tolerance: Patient tolerated treatment  well Patient left: in bed;with call bell/phone within reach;with nursing in room Nurse Communication: Mobility status   GP     Donell Sievert, [place] [phone] 05/31/2012, 3:16 PM

## 2012-05-31 NOTE — Progress Notes (Addendum)
Inpatient Diabetes Program Recommendations  AACE/ADA: New Consensus Statement on Inpatient Glycemic Control (2013)  Target Ranges:  Prepandial:   less than 140 mg/dL      Peak postprandial:   less than 180 mg/dL (1-2 hours)      Critically ill patients:  140 - 180 mg/dL      Results for ANDEN, BARTOLO (MRN [id number]) as of 05/31/2012 09:12  Ref. Range 05/30/2012 06:39 05/30/2012 11:15 05/30/2012 16:26 05/30/2012 17:07 05/30/2012 17:46 05/30/2012 21:21 05/30/2012 22:32  Glucose-Capillary Latest Range: 70-99 mg/dL 130 (H) 84 51 (L) 38 (LL) 136 (H) 66 (L) 73    Results for VIR, WHETSTINE (MRN [id number]) as of 05/31/2012 09:12  Ref. Range 05/31/2012 06:55 05/31/2012 07:39  Glucose-Capillary Latest Range: 70-99 mg/dL 58 (L) 88    Multiple hypoglycemic events.  Noted patient received one dose of Glipizide yesterday morning.  Glipizide then d/c'd yesterday.  Called Dr. Ophelia Charter today to discuss today.  Following adjustments made to patient's regimen this morning: -Lantus decreased to 15 units QHS -Novolog 4 units tid with meals (meal coverage) d/c'd  -Metformin d/c'd  Called [employer] to alert her to these changes in patient's medications.   Will follow and assist. Ambrose Finland RN, MSN, CDE Diabetes Coordinator Inpatient Diabetes Program [phone]

## 2012-05-31 NOTE — Progress Notes (Signed)
[facility]  Patient Status:  [name] is a new pt to [facility] services.   [facility] is providing the following services:   HH nursing and infusion pharmacy services.   If patient discharges after hours, please call [phone].   Sedalia Muta 05/31/2012, 8:37 PM

## 2012-05-31 NOTE — Progress Notes (Signed)
Subjective: 2 Days Post-Op Procedure(s) (LRB): IRRIGATION AND DEBRIDEMENT EXTREMITY- right foot (Right) Patient reports pain as mild.    Objective: Vital signs in last 24 hours: Temp:  [98.6 F (37 C)-99.5 F (37.5 C)] 98.8 F (37.1 C) (04/23 0529) Pulse Rate:  [77-99] 77 (04/23 0529) Resp:  [18] 18 (04/23 0529) BP: (125-143)/(58-75) 125/58 mmHg (04/23 0529) SpO2:  [96 %-100 %] 96 % (04/23 0529) Weight:  [87.1 kg (192 lb 0.3 oz)] 87.1 kg (192 lb 0.3 oz) (04/22 0900)  Intake/Output from previous day: 04/22 0701 - 04/23 0700 In: -  Out: 1 [Urine:1] Intake/Output this shift:    No results found for this basename: HGB,  in the last 72 hours No results found for this basename: WBC, RBC, HCT, PLT,  in the last 72 hours  Recent Labs  05/31/12 0500  NA 143  K 3.6  CL 106  CO2 28  BUN 33*  CREATININE 3.33*  GLUCOSE 67*  CALCIUM 8.5   No results found for this basename: LABPT, INR,  in the last 72 hours  VAC removed, no purulence. wound as expected.   Assessment/Plan: 2 Days Post-Op Procedure(s) (LRB): IRRIGATION AND DEBRIDEMENT EXTREMITY- right foot (Right) Continue ABX therapy due to Post-op infection.   Pending cultures , probable home Thursday with IV PICC line ABX for several weeks. Duration will be based on cultures if anything grows. VAC for several weeks.   Lawrence [name],Jamichael C 05/31/2012, 7:53 AM

## 2012-05-31 NOTE — Progress Notes (Addendum)
Patient ID: NHIA HEAPHY, male   DOB: [birth year], 52 y.o.   MRN: [id number] Creatinine jumped greater than 3.   Now 3.33 was 1.49.   Has problems with gastric motility with vomiting after meals, metaclopramide given, CBG's low.   Stop meal time 4units, continue sliding scale, decreased lantus to 15 units. His A1C was 8.6 and he was not following ADA CHO diet at home and not following CBG's Poor control.    Will stop Vanc. Awaiting cultures, I removed his VAC no purulence, will decide on ABX pending cultures but do not want to put him into renal failure .    Hold lisonopril and HCTZ.  BP has been OK will reorder if BP increases of after creatinine corrects.

## 2012-06-01 ENCOUNTER — Inpatient Hospital Stay (HOSPITAL_COMMUNITY): Payer: Medicaid Other

## 2012-06-01 LAB — BASIC METABOLIC PANEL
BUN: 39 mg/dL — ABNORMAL HIGH (ref 6–23)
CO2: 25 mEq/L (ref 19–32)
Calcium: 8.5 mg/dL (ref 8.4–10.5)
GFR calc non Af Amer: 16 mL/min — ABNORMAL LOW (ref >90)
Glucose, Bld: 125 mg/dL — ABNORMAL HIGH (ref 70–99)

## 2012-06-01 LAB — GLUCOSE, CAPILLARY: Glucose-Capillary: 131 mg/dL — ABNORMAL HIGH (ref 70–99)

## 2012-06-01 MED ORDER — SODIUM CHLORIDE 0.9 % IV SOLN
300.0000 mg | Freq: Two times a day (BID) | INTRAVENOUS | Status: DC
  Administered 2012-06-01 (×2): 300 mg via INTRAVENOUS
  Filled 2012-06-01 (×3): qty 300

## 2012-06-01 MED ORDER — AMLODIPINE BESYLATE 5 MG PO TABS
5.0000 mg | ORAL_TABLET | Freq: Every day | ORAL | Status: DC
  Administered 2012-06-01 – 2012-06-02 (×2): 5 mg via ORAL
  Filled 2012-06-01 (×2): qty 1

## 2012-06-01 NOTE — Progress Notes (Signed)
Physical Therapy Treatment Patient Details Name: Lawrence [name] MRN: [id number] DOB: [birth year] Today's Date: 06/01/2012 Time: 4098-1191 PT Time Calculation (min): 23 min  PT Assessment / Plan / Recommendation Comments on Treatment Session  Pr. reports he thinks he will be taking a VAC home with him at [facility].  Educated pt. on safety issues regarding tubing from [facility] which could possibly cause a fall    Follow Up Recommendations  Supervision - Intermittent     Does the patient have the potential to tolerate intense rehabilitation     Barriers to Discharge        Equipment Recommendations  None recommended by PT    Recommendations for Other Services    Frequency Min 4X/week   Plan Discharge plan remains appropriate;Frequency remains appropriate    Precautions / Restrictions Precautions Precautions: Fall Precaution Comments: wound VAC Required Braces or Orthoses: Other Brace/Splint Other Brace/Splint: Darco Shoe Restrictions Weight Bearing Restrictions: Yes RLE Weight Bearing: Weight bearing as tolerated   Pertinent Vitals/Pain No pain reported    Mobility  Bed Mobility Bed Mobility: Not assessed Transfers Transfers: Sit to Stand;Stand to Sit Sit to Stand: 5: Supervision;From chair/3-in-1;With armrests Stand to Sit: 5: Supervision;With armrests;To chair/3-in-1 Details for Transfer Assistance: increased time , riminder to place both hands back on recliner before sitting down Ambulation/Gait Ambulation/Gait Assistance: 5: Supervision Ambulation Distance (Feet): 100 Feet Assistive device: Rolling walker Ambulation/Gait Assistance Details: no rest breaks needed today Gait Pattern: Step-to pattern;Decreased step length - left;Decreased stance time - right;Antalgic;Trunk flexed Gait velocity: decreased  Stairs: No    Exercises     PT Diagnosis:    PT Problem List:   PT Treatment Interventions:     PT Goals Acute Rehab PT Goals Pt will go Sit to Stand: with modified  independence PT Goal: Sit to Stand - Progress: Progressing toward goal Pt will go Stand to Sit: Independently PT Goal: Stand to Sit - Progress: Progressing toward goal Pt will Ambulate: >150 feet;with modified independence;with rolling walker PT Goal: Ambulate - Progress: Progressing toward goal  Visit Information  Last PT Received On: 06/01/12 Assistance Needed: +1    Subjective Data  Subjective: "I'll be taking one of these home" (referring to [facility])   Cognition  Cognition Arousal/Alertness: Awake/alert Behavior During Therapy: WFL for tasks assessed/performed Overall Cognitive Status: Within Functional Limits for tasks assessed    Balance     End of Session PT - End of Session Equipment Utilized During Treatment: Gait belt;Other (comment) (darco shoe) Activity Tolerance: Patient tolerated treatment well Patient left: in chair;with call bell/phone within reach Nurse Communication: Mobility status   GP     Ferman Hamming 06/01/2012, 3:21 PM Weldon Picking PT Acute Rehab Services [phone] Beeper [phone]

## 2012-06-01 NOTE — Progress Notes (Signed)
Subjective: 3 Days Post-Op Procedure(s) (LRB): IRRIGATION AND DEBRIDEMENT EXTREMITY- right foot (Right) Patient reports pain as mild.    Objective: Vital signs in last 24 hours: Temp:  [97.8 F (36.6 C)-98.7 F (37.1 C)] 97.9 F (36.6 C) (04/24 0644) Pulse Rate:  [74-81] 75 (04/24 0644) Resp:  [20] 20 (04/24 0644) BP: (123-145)/(60-74) 136/74 mmHg (04/24 0644) SpO2:  [100 %] 100 % (04/24 0644)  Intake/Output from previous day: 04/23 0701 - 04/24 0700 In: 960 [P.O.:960] Out: -  Intake/Output this shift:    No results found for this basename: HGB,  in the last 72 hours No results found for this basename: WBC, RBC, HCT, PLT,  in the last 72 hours  Recent Labs  05/31/12 0500 06/01/12 0444  NA 143 139  K 3.6 4.0  CL 106 102  CO2 28 25  BUN 33* 39*  CREATININE 3.33* 4.08*  GLUCOSE 67* 125*  CALCIUM 8.5 8.5   No results found for this basename: LABPT, INR,  in the last 72 hours  good VAC seal  Assessment/Plan: 3 Days Post-Op Procedure(s) (LRB): IRRIGATION AND DEBRIDEMENT EXTREMITY- right foot (Right) Continue ABX therapy due to Post-op infection.     Creatinine continues to rise now 4.0, yesterday 3.3 .   Cultures Gm neg rod awaiting more info.      Nasirah Sachs,Willian C 06/01/2012, 8:07 AM

## 2012-06-01 NOTE — Consult Note (Addendum)
Lawrence [name] is an 52 y.o. male referred by Dr Ophelia Charter   Chief Complaint: Acute on CKD3 HPI: [name] admitted 05/29/12 for poor healing/infection of Rt 1st ray amputation done last month.  He underwent debridement and revision of the amputation on 05/29/12.  Baseline Scr 1.4 last month on 3/26 and then increased to 2.2 on 3/28 following aortogram with runoff to Rt leg.  Scr trended down to 1.5 on 4/16.  Next Scr after admission on 4/23 was 3.3 and today 4.0.  He has noticed no change in UO.  I/O's not kept.  He was taking advil 2-3/d for few days prior to admission and was on lisinopril until yest.  No hypotension.  No Sxs of cholesterol emboli post aortogram last month.  No skin rashes.  He was also on doxycycline PTA.  He has had DM for about 30 yrs and long hx HTN  Past Medical History  Diagnosis Date  . Hypertension   . Chronic kidney disease   . Hypercholesteremia   . Diabetes mellitus     insulin dependent    Past Surgical History  Procedure Laterality Date  . Amputation Right 05/03/2012    Procedure: Right First Ray AMPUTATION;  Surgeon: Eldred Manges, MD;  Location: [facility] OR;  Service: Orthopedics;  Laterality: Right;  . I&d extremity Right 05/29/2012    Procedure: IRRIGATION AND DEBRIDEMENT EXTREMITY- right foot;  Surgeon: Eldred Manges, MD;  Location: [facility] OR;  Service: Orthopedics;  Laterality: Right;  Right Foot Debridement, VAC Application  . Toe amputation Right 05/29/2012    1ST  ray      Dr Ophelia Charter    History reviewed. No pertinent family history. Social History:  reports that he has never smoked. He has never used smokeless tobacco. He reports that he does not drink alcohol or use illicit drugs.  Allergies: No Known Allergies  Medications Prior to Admission  Medication Sig Dispense Refill  . aspirin 81 MG tablet Take 81 mg by mouth every morning.       Marland Kitchen doxycycline (VIBRA-TABS) 100 MG tablet Take 100 mg by mouth 2 (two) times daily.      Marland Kitchen glipiZIDE (GLUCOTROL XL) 10 MG 24 hr  tablet Take 1 tablet (10 mg total) by mouth 2 (two) times daily.  60 tablet  11  . HYDROcodone-acetaminophen (NORCO/VICODIN) 5-325 MG per tablet Take 1-2 tablets by mouth every 4 (four) hours as needed. For pain      . insulin glargine (LANTUS) 100 UNIT/ML injection Inject 20 Units into the skin at bedtime.  10 mL  6  . lisinopril-hydrochlorothiazide (PRINZIDE,ZESTORETIC) 20-12.5 MG per tablet Take 1 tablet by mouth daily.      . metFORMIN (GLUCOPHAGE) 850 MG tablet Take 850 mg by mouth 2 (two) times daily with a meal.      . metoprolol tartrate (LOPRESSOR) 25 MG tablet Take 1 tablet (25 mg total) by mouth 2 (two) times daily.  180 tablet  3  . omeprazole (PRILOSEC) 20 MG capsule Take 1 capsule (20 mg total) by mouth daily.  30 capsule  3     Lab Results: UA: 30 prot 3-6 wbc  No results found for this basename: WBC, HGB, HCT, PLT,  in the last 72 hours BMET  Recent Labs  05/31/12 0500 06/01/12 0444  NA 143 139  K 3.6 4.0  CL 106 102  CO2 28 25  GLUCOSE 67* 125*  BUN 33* 39*  CREATININE 3.33* 4.08*  CALCIUM 8.5  8.5   LFT No results found for this basename: PROT, ALBUMIN, AST, ALT, ALKPHOS, BILITOT, BILIDIR, IBILI,  in the last 72 hours No results found.  ROS: Appetite good No change in vision No SOB No CP No abd pain or change in bowels No dysuria Pain Rt foot No rash  PHYSICAL EXAM: Blood pressure 128/69, pulse 74, temperature 98.5 F (36.9 C), temperature source Oral, resp. rate 18, height 5' 4.96" (1.65 m), weight 87.1 kg (192 lb 0.3 oz), SpO2 100.00%. HEENT: PERRLA EOMI NECK:no JVD No Nodes LUNGS:Clear CARDIAC:RRR WO MRG ABD:+ BS NTNT no HSM EXT:Rt foot bandaged.  Tr-1+ edema.  No livedo, no purple toes on Lt foot PICC line Rt upper arm NEURO:CNI motor intact.  Ox3 no asterixis  Assessment: 1. Acute on CKD 3.  CKD 3 sec DM/HTN and acute component most likely due to hemodynamic changes from lisinopril/advil/ infected Rt foot.  Less likely but possible is AIN or  cholesterol emboli from aortogram last month PLAN: 1. Daily renal profile 2. No NSAID's/ACE/ARB for now 3. Amlodipine for BP 4. Renal US 5. I/O's 6. CBC with diff 7. Urine eos 8. Renal diet 9. Dc lactated ringers 10. I did discuss the possibility of HD if renal fx cont to worsen.  If that were to occur, I would be optimistic it would only be temporary 11.  I would not recommend the PICC line as it will damage the veins that he will need later for a dialysis access.  Please remove and use central line   Kilee Hedding T 06/01/2012, 2:43 PM

## 2012-06-01 NOTE — Progress Notes (Signed)
[facility]  Day # 2 teflaro  Subjective: Nausea slightly better, cr continues to go up   Antibiotics:  Anti-infectives   Start     Dose/Rate Route Frequency Ordered Stop   06/01/12 1400  ceftaroline (TEFLARO) 300 mg in sodium chloride 0.9 % 250 mL IVPB     300 mg 250 mL/hr over 60 Minutes Intravenous Every 12 hours 06/01/12 0949     05/31/12 1400  ceftaroline (TEFLARO) 400 mg in sodium chloride 0.9 % 250 mL IVPB  Status:  Discontinued     400 mg 250 mL/hr over 60 Minutes Intravenous Every 12 hours 05/31/12 1252 06/01/12 0949   05/31/12 1400  metroNIDAZOLE (FLAGYL) tablet 500 mg     500 mg Oral 3 times per day 05/31/12 1252     05/30/12 0600  piperacillin-tazobactam (ZOSYN) IVPB 3.375 g  Status:  Discontinued     3.375 g 12.5 mL/hr over 240 Minutes Intravenous Every 8 hours 05/30/12 0232 05/31/12 1252   05/29/12 1800  vancomycin (VANCOCIN) 1,750 mg in sodium chloride 0.9 % 500 mL IVPB  Status:  Discontinued     1,750 mg 250 mL/hr over 120 Minutes Intravenous Every 24 hours 05/29/12 1611 05/31/12 0921   05/29/12 1600  piperacillin-tazobactam (ZOSYN) IVPB 3.375 g  Status:  Discontinued     3.375 g 100 mL/hr over 30 Minutes Intravenous 3 times per day 05/29/12 1545 05/30/12 0232   05/29/12 1044  ceFAZolin (ANCEF) 2-3 GM-% IVPB SOLR    Comments:  KENSMOE, KATHERINE: cabinet override      05/29/12 1044 05/29/12 1245   05/29/12 1038  ceFAZolin (ANCEF) IVPB 2 g/50 mL premix  Status:  Discontinued     2 g 100 mL/hr over 30 Minutes Intravenous On call to O.R. 05/29/12 1038 05/29/12 1517      Medications: Scheduled Meds: . aspirin  81 mg Oral q morning - 10a  . ceFTAROline (TEFLARO) IV  300 mg Intravenous Q12H  . docusate sodium  100 mg Oral BID  . insulin aspart  0-5 Units Subcutaneous QHS  . insulin aspart  0-9 Units Subcutaneous TID WC  . insulin glargine  15 Units Subcutaneous QHS  . metoCLOPramide  10 mg Oral TID AC   Or  . metoCLOPramide  (REGLAN) injection  5-10 mg Intravenous TID AC  . metoprolol tartrate  25 mg Oral BID  . metroNIDAZOLE  500 mg Oral Q8H  . pantoprazole  40 mg Oral Daily   Continuous Infusions: . sodium chloride 50 mL/hr at 05/31/12 2253  . lactated ringers 50 mL/hr at 05/29/12 1109   PRN Meds:.bisacodyl, diphenhydrAMINE, HYDROcodone-acetaminophen, morphine injection, ondansetron (ZOFRAN) IV, ondansetron, oxyCODONE-acetaminophen, polyethylene glycol, sodium chloride   Objective: Weight change:   Intake/Output Summary (Last 24 hours) at 06/01/12 1047 Last data filed at 06/01/12 0700  Gross per 24 hour  Intake    780 ml  Output      0 ml  Net    780 ml   Blood pressure 136/74, pulse 75, temperature 97.9 F (36.6 C), temperature source Oral, resp. rate 20, height 5' 4.96" (1.65 m), weight 192 lb 0.3 oz (87.1 kg), SpO2 100.00%. Temp:  [97.8 F (36.6 C)-98 F (36.7 C)] 97.9 F (36.6 C) (04/24 0644) Pulse Rate:  [74-81] 75 (04/24 0644) Resp:  [20] 20 (04/24 0644) BP: (136-145)/(66-74) 136/74 mmHg (04/24 0644) SpO2:  [100 %] 100 % (04/24 0644)  Physical Exam: General: Alert and awake, oriented x3, not in any acute  distress.  HEENT: anicteric sclera, pupils reactive to light and accommodation, EOMI, oropharynx clear and without exudate  CVS regular rate, normal r, no murmur rubs or gallops  Chest: clear to auscultation bilaterally, no wheezing, rales or rhonchi  Abdomen: soft nontender, nondistended, normal bowel sounds,  Extremities: right foot with bandage, left foot without lesions   Lab Results: No results found for this basename: WBC, HGB, HCT, PLT,  in the last 72 hours  BMET  Recent Labs  05/31/12 0500 06/01/12 0444  NA 143 139  K 3.6 4.0  CL 106 102  CO2 28 25  GLUCOSE 67* 125*  BUN 33* 39*  CREATININE 3.33* 4.08*  CALCIUM 8.5 8.5    Micro Results: Recent Results (from the past 240 hour(s))  SURGICAL PCR SCREEN     Status: None   Collection Time    05/24/12  2:38 PM        Result Value Range Status   MRSA, PCR NEGATIVE  NEGATIVE Final   Staphylococcus aureus NEGATIVE  NEGATIVE Final   Comment:            The Xpert SA Assay (FDA     approved for NASAL specimens     in patients over 59 years of age),     is one component of     a comprehensive surveillance     program.  Test performance has     been validated by [employer] for patients greater     than or equal to 65 year old.     It is not intended     to diagnose infection nor to     guide or monitor treatment.  CULTURE, ROUTINE-ABSCESS     Status: None   Collection Time    05/29/12  1:05 PM      Result Value Range Status   Specimen Description ABSCESS RIGHT FOOT   Final   Special Requests PATIENT ON FOLLOWING ANCEF   Final   Gram Stain     Final   Value: ABUNDANT WBC PRESENT, PREDOMINANTLY PMN     NO SQUAMOUS EPITHELIAL CELLS SEEN     NO ORGANISMS SEEN   Culture FEW GRAM NEGATIVE RODS   Final   Report Status PENDING   Incomplete  ANAEROBIC CULTURE     Status: None   Collection Time    05/29/12  1:05 PM      Result Value Range Status   Specimen Description ABSCESS RIGHT FOOT   Final   Special Requests PATIENT ON FOLLOWING ANCEF   Final   Gram Stain     Final   Value: ABUNDANT WBC PRESENT, PREDOMINANTLY PMN     NO SQUAMOUS EPITHELIAL CELLS SEEN     NO ORGANISMS SEEN   Culture     Final   Value: NO ANAEROBES ISOLATED; CULTURE IN PROGRESS FOR 5 DAYS   Report Status PENDING   Incomplete    Studies/Results: No results found.    Assessment/Plan: JERIME ARIF is a 52 y.o. male with  with gangrenous first toe sp first Ray amputation with dehiscence and persistence of infection at this site and by MRI ? Of infection of the 2nd toe, sp further debridement of the first ray amputation . Now with worsening renal fxn in setting of N, V dehydration and vancomycin. He is growing an Enterobactericiae species from his intraoperative cultures with formal ID and sensis tomorrow ESR=110  #1  Osteomyelitis of first ray amputation  site, ? 2nd toe amputation (Dr. Ophelia Charter indicated in op note he did not feel this appeared to be osteo in the OR  Sec to Enterobactericae species --Teflaro and flagyl fine for now, but will hope to simplify further in the am, hopfully to ceftriaxone and flagyl   #3 Renal insufficiency: cr up, agree dc vanco renal consult   LOS: 3 days   Acey Lav 06/01/2012, 10:47 AM

## 2012-06-01 NOTE — Care Management Note (Signed)
CARE MANAGEMENT NOTE 06/01/2012  Patient:  Lawrence [name], Lawrence [name]   Account Number:  [id number]  Date Initiated:  05/31/2012  Documentation initiated by:  Vance Peper  Subjective/Objective Assessment:   52 yr old male s/p right great toe amputation with application of wound vac.     Action/Plan:   CM spoke with patient concerning home health needs and wound vac.  Faxed vac authorixation form to [employer] along with hardship information.   Anticipated DC Date:  06/02/2012   Anticipated DC Plan:  HOME W HOME HEALTH SERVICES      DC Planning Services  CM consult      PAC Choice  DURABLE MEDICAL EQUIPMENT  HOME HEALTH   Choice offered to / List presented to:     DME arranged  VAC      DME agency  [employer]     HH arranged  HH-1 RN      [facility] agency  [facility]   Status of service:  Completed, signed off Medicare Important Message given?   (If response is "NO", the following Medicare IM given date fields will be blank) Date Medicare IM given:   Date Additional Medicare IM given:    Discharge Disposition:  HOME W HOME HEALTH SERVICES  Per UR Regulation:    If discussed at Long Length of Stay Meetings, dates discussed:

## 2012-06-02 DIAGNOSIS — A498 Other bacterial infections of unspecified site: Secondary | ICD-10-CM

## 2012-06-02 LAB — COMPREHENSIVE METABOLIC PANEL
ALT: 11 U/L (ref 0–53)
Alkaline Phosphatase: 108 U/L (ref 39–117)
BUN: 46 mg/dL — ABNORMAL HIGH (ref 6–23)
CO2: 24 mEq/L (ref 19–32)
Chloride: 102 mEq/L (ref 96–112)
GFR calc Af Amer: 17 mL/min — ABNORMAL LOW (ref >90)
Glucose, Bld: 178 mg/dL — ABNORMAL HIGH (ref 70–99)
Potassium: 3.9 mEq/L (ref 3.5–5.1)
Sodium: 139 mEq/L (ref 135–145)
Total Bilirubin: 0.2 mg/dL — ABNORMAL LOW (ref 0.3–1.2)

## 2012-06-02 LAB — PHOSPHORUS: Phosphorus: 5.2 mg/dL — ABNORMAL HIGH (ref 2.3–4.6)

## 2012-06-02 LAB — CBC WITH DIFFERENTIAL/PLATELET
Hemoglobin: 7.5 g/dL — ABNORMAL LOW (ref 13.0–17.0)
Lymphocytes Relative: 14 % (ref 12–46)
Lymphs Abs: 0.7 10*3/uL (ref 0.7–4.0)
MCH: 25.8 pg — ABNORMAL LOW (ref 26.0–34.0)
Monocytes Relative: 9 % (ref 3–12)
Neutro Abs: 4 10*3/uL (ref 1.7–7.7)
Neutrophils Relative %: 74 % (ref 43–77)
Platelets: 145 10*3/uL — ABNORMAL LOW (ref 150–400)
RBC: 2.91 MIL/uL — ABNORMAL LOW (ref 4.22–5.81)
WBC: 5.4 10*3/uL (ref 4.0–10.5)

## 2012-06-02 LAB — IRON AND TIBC
Iron: 21 ug/dL — ABNORMAL LOW (ref 42–135)
Saturation Ratios: 11 % — ABNORMAL LOW (ref 20–55)
UIBC: 169 ug/dL (ref 125–400)

## 2012-06-02 LAB — GLUCOSE, CAPILLARY: Glucose-Capillary: 151 mg/dL — ABNORMAL HIGH (ref 70–99)

## 2012-06-02 LAB — CULTURE, ROUTINE-ABSCESS

## 2012-06-02 MED ORDER — AMLODIPINE BESYLATE 5 MG PO TABS
5.0000 mg | ORAL_TABLET | Freq: Every day | ORAL | Status: DC
  Administered 2012-06-03 – 2012-06-05 (×3): 5 mg via ORAL
  Filled 2012-06-02 (×4): qty 1

## 2012-06-02 MED ORDER — DEXTROSE 5 % IV SOLN
2.0000 g | INTRAVENOUS | Status: DC
  Administered 2012-06-02: 2 g via INTRAVENOUS
  Filled 2012-06-02: qty 2

## 2012-06-02 MED ORDER — LEVOFLOXACIN 500 MG PO TABS
500.0000 mg | ORAL_TABLET | ORAL | Status: DC
  Administered 2012-06-03 – 2012-06-05 (×2): 500 mg via ORAL
  Filled 2012-06-02 (×2): qty 1

## 2012-06-02 NOTE — Care Management Note (Signed)
CARE MANAGEMENT NOTE 06/02/2012  Patient:  Lawrence [name], Lawrence [name]   Account Number:  [id number]  Date Initiated:  05/31/2012  Documentation initiated by:  Vance Peper  Subjective/Objective Assessment:   52 yr old male s/p right great toe amputation with application of wound vac.     Action/Plan:   CM spoke with patient concerning home health needs and wound vac.  Faxed vac authorixation form to [employer] along with hardship information.   Anticipated DC Date:  06/02/2012   Anticipated DC Plan:  HOME W HOME HEALTH SERVICES      DC Planning Services  CM consult      PAC Choice  DURABLE MEDICAL EQUIPMENT  HOME HEALTH   Choice offered to / List presented to:     DME arranged  VAC      DME agency  [employer]     HH arranged  HH-1 RN      [facility] agency  [facility]   Status of service:  Completed, signed off Medicare Important Message given?   (If response is "NO", the following Medicare IM given date fields will be blank) Date Medicare IM given:   Date Additional Medicare IM given:    Discharge Disposition:  HOME W HOME HEALTH SERVICES  Per UR Regulation:    If discussed at Long Length of Stay Meetings, dates discussed:    Comments:  06/02/12  8:30 AM Vance Peper, RN BSN NCM CM received call from financial counselor- Morrie Sheldon. They will be making application for disability medicare/medicaid on Tueday.  06/02/12 8:35 AM:CM received call this AM from [facility] with [employer] stating that wound Vac has been authorized and will be delivered within 4 hours .

## 2012-06-02 NOTE — Consult Note (Signed)
Asked to place CVL for abx after PICC removed. I have discussed with Dr Daiva Eves. Plan is to transition to oral abx soon. Pt has a PIV. Will forgo CVL for now. Please call again if plans change and CVL needed  Billy Fischer, MD ; [facility] [phone].  After 5:30 PM or weekends, call [phone]

## 2012-06-02 NOTE — Progress Notes (Signed)
[facility]   Day # 3  teflaro  Subjective: Feeling better   Antibiotics:  Anti-infectives   Start     Dose/Rate Route Frequency Ordered Stop   06/02/12 1400  cefTRIAXone (ROCEPHIN) 2 g in dextrose 5 % 50 mL IVPB     2 g 100 mL/hr over 30 Minutes Intravenous Every 24 hours 06/02/12 1132     06/01/12 1400  ceftaroline (TEFLARO) 300 mg in sodium chloride 0.9 % 250 mL IVPB  Status:  Discontinued     300 mg 250 mL/hr over 60 Minutes Intravenous Every 12 hours 06/01/12 0949 06/02/12 1132   05/31/12 1400  ceftaroline (TEFLARO) 400 mg in sodium chloride 0.9 % 250 mL IVPB  Status:  Discontinued     400 mg 250 mL/hr over 60 Minutes Intravenous Every 12 hours 05/31/12 1252 06/01/12 0949   05/31/12 1400  metroNIDAZOLE (FLAGYL) tablet 500 mg     500 mg Oral 3 times per day 05/31/12 1252     05/30/12 0600  piperacillin-tazobactam (ZOSYN) IVPB 3.375 g  Status:  Discontinued     3.375 g 12.5 mL/hr over 240 Minutes Intravenous Every 8 hours 05/30/12 0232 05/31/12 1252   05/29/12 1800  vancomycin (VANCOCIN) 1,750 mg in sodium chloride 0.9 % 500 mL IVPB  Status:  Discontinued     1,750 mg 250 mL/hr over 120 Minutes Intravenous Every 24 hours 05/29/12 1611 05/31/12 0921   05/29/12 1600  piperacillin-tazobactam (ZOSYN) IVPB 3.375 g  Status:  Discontinued     3.375 g 100 mL/hr over 30 Minutes Intravenous 3 times per day 05/29/12 1545 05/30/12 0232   05/29/12 1044  ceFAZolin (ANCEF) 2-3 GM-% IVPB SOLR    Comments:  KENSMOE, KATHERINE: cabinet override      05/29/12 1044 05/29/12 1245   05/29/12 1038  ceFAZolin (ANCEF) IVPB 2 g/50 mL premix  Status:  Discontinued     2 g 100 mL/hr over 30 Minutes Intravenous On call to O.R. 05/29/12 1038 05/29/12 1517      Medications: Scheduled Meds: . [START ON 06/03/2012] amLODipine  5 mg Oral QHS  . aspirin  81 mg Oral q morning - 10a  . cefTRIAXone (ROCEPHIN)  IV  2 g Intravenous Q24H  . docusate sodium  100 mg Oral BID  .  insulin aspart  0-5 Units Subcutaneous QHS  . insulin aspart  0-9 Units Subcutaneous TID WC  . insulin glargine  15 Units Subcutaneous QHS  . metoCLOPramide  10 mg Oral TID AC   Or  . metoCLOPramide (REGLAN) injection  5-10 mg Intravenous TID AC  . metoprolol tartrate  25 mg Oral BID  . metroNIDAZOLE  500 mg Oral Q8H  . pantoprazole  40 mg Oral Daily   Continuous Infusions:   PRN Meds:.bisacodyl, diphenhydrAMINE, HYDROcodone-acetaminophen, morphine injection, ondansetron (ZOFRAN) IV, ondansetron, oxyCODONE-acetaminophen, polyethylene glycol, sodium chloride   Objective: Weight change:   Intake/Output Summary (Last 24 hours) at 06/02/12 1340 Last data filed at 06/02/12 0600  Gross per 24 hour  Intake 1795.83 ml  Output   1310 ml  Net 485.83 ml   Blood pressure 124/60, pulse 80, temperature 99 F (37.2 C), temperature source Oral, resp. rate 16, height 5' 4.96" (1.65 m), weight 192 lb 0.3 oz (87.1 kg), SpO2 98.00%. Temp:  [98.8 F (37.1 C)-99.3 F (37.4 C)] 99 F (37.2 C) (04/25 0601) Pulse Rate:  [66-92] 80 (04/25 0936) Resp:  [16] 16 (04/25 0601) BP: (105-157)/(55-70) 124/60 mmHg (04/25  1610) SpO2:  [98 %-100 %] 98 % (04/25 0601)  Physical Exam: General: Alert and awake, oriented x3, not in any acute distress.  HEENT: anicteric sclera, pupils reactive to light and accommodation, EOMI, oropharynx clear and without exudate  CVS regular rate, normal r, no murmur rubs or gallops  Chest: clear to auscultation bilaterally, no wheezing, rales or rhonchi  Abdomen: soft nontender, nondistended, normal bowel sounds,  Extremities: right foot with bandage, left foot without lesions   Lab Results:  Recent Labs  06/02/12 0500  WBC 5.4  HGB 7.5*  HCT 22.1*  PLT 145*    BMET  Recent Labs  06/01/12 0444 06/02/12 0500  NA 139 139  K 4.0 3.9  CL 102 102  CO2 25 24  GLUCOSE 125* 178*  BUN 39* 46*  CREATININE 4.08* 4.35*  CALCIUM 8.5 8.4    Micro Results: Recent  Results (from the past 240 hour(s))  SURGICAL PCR SCREEN     Status: None   Collection Time    05/24/12  2:38 PM      Result Value Range Status   MRSA, PCR NEGATIVE  NEGATIVE Final   Staphylococcus aureus NEGATIVE  NEGATIVE Final   Comment:            The Xpert SA Assay (FDA     approved for NASAL specimens     in patients over 74 years of age),     is one component of     a comprehensive surveillance     program.  Test performance has     been validated by [employer] for patients greater     than or equal to 20 year old.     It is not intended     to diagnose infection nor to     guide or monitor treatment.  CULTURE, ROUTINE-ABSCESS     Status: None   Collection Time    05/29/12  1:05 PM      Result Value Range Status   Specimen Description ABSCESS RIGHT FOOT   Final   Special Requests PATIENT ON FOLLOWING ANCEF   Final   Gram Stain     Final   Value: ABUNDANT WBC PRESENT, PREDOMINANTLY PMN     NO SQUAMOUS EPITHELIAL CELLS SEEN     NO ORGANISMS SEEN   Culture FEW ESCHERICHIA COLI   Final   Report Status 06/02/2012 FINAL   Final   Organism ID, Bacteria ESCHERICHIA COLI   Final  ANAEROBIC CULTURE     Status: None   Collection Time    05/29/12  1:05 PM      Result Value Range Status   Specimen Description ABSCESS RIGHT FOOT   Final   Special Requests PATIENT ON FOLLOWING ANCEF   Final   Gram Stain     Final   Value: ABUNDANT WBC PRESENT, PREDOMINANTLY PMN     NO SQUAMOUS EPITHELIAL CELLS SEEN     NO ORGANISMS SEEN   Culture     Final   Value: NO ANAEROBES ISOLATED; CULTURE IN PROGRESS FOR 5 DAYS   Report Status PENDING   Incomplete    Studies/Results: US Renal  06/01/2012  *RADIOLOGY REPORT*  Clinical Data: Acute on chronic renal failure.  RENAL/URINARY TRACT ULTRASOUND COMPLETE  Comparison:  CT dated 03/29/2011  Findings:  Right Kidney:  The right kidney measures approximately 13.8 cm in length. There is suggestion of mild cortical atrophy and mildly increased  cortical echogenicity.  No hydronephrosis or focal lesion is identified.  Left Kidney:  The left kidney measures 14 cm in length.  No atrophy, hydronephrosis, abnormal echogenicity or focal lesion is identified.  Bladder:  Bladder is moderately distended at the time of imaging and unremarkable in appearance.  IMPRESSION: No evidence of hydronephrosis.  Equal sized kidneys with the right demonstrating suggestion of mild cortical atrophy and increased cortical echogenicity.   Original Report Authenticated By: Irish Lack, M.D.       Assessment/Plan: Lawrence [name] is a 52 y.o. male with  with gangrenous first toe sp first Ray amputation with dehiscence and persistence of infection at this site and by MRI ? Of infection of the 2nd toe, sp further debridement of the first ray amputation . Now with worsening renal fxn in setting of N, V dehydration and vancomycin. 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 2nd toe amputation (Dr. Ophelia Charter indicated in op note he did not feel this appeared to be osteo in the OR  Sec to E coli +/- anaerobes --while in house would continue IV ceftriaxone and flagyl --as nears time for DC would switch to   ORAL LEVAQUIN 500MG  PO EVERY OTHER DAY  WITH  FLAGYL 500MG  THREE TIMES DAILY  (this will obviate need for IV access)  I would continue this regimen for 4 weeks postoperatively  I will plan on having him followup with [place] in [facility] in the next few weeks  Please call with further questions    LOS: 4 days   Paulette Blanch Dam 06/02/2012, 1:40 PM

## 2012-06-02 NOTE — Progress Notes (Signed)
Patient ID: Lawrence [name], male   DOB: 18-Dec-1960, 52 y.o.   MRN: [id number] Creatinine 4.33.      potassium is normal.    Foot cultures E. Coli.    Same as UTI last Jan.     Discussed with ID.   Will treat with PO ABX. Does not need central line.   Once his creatinine peaks and then starts back down we will be able to discharge home.   Rate of rise of creatinine has decreased. Will go home with VAC and   PO ABX which are going to be ordered by ID team.

## 2012-06-02 NOTE — Progress Notes (Signed)
Physical Therapy Treatment Patient Details Name: Lawrence [name] MRN: [id number] DOB: 1960-03-19 Today's Date: 06/02/2012 Time: 9528-4132 PT Time Calculation (min): 17 min  PT Assessment / Plan / Recommendation Comments on Treatment Session  Patient refused gait belt and increasing ambulation.    Follow Up Recommendations  Supervision - Intermittent     Does the patient have the potential to tolerate intense rehabilitation     Barriers to Discharge        Equipment Recommendations  None recommended by PT    Recommendations for Other Services    Frequency Min 4X/week   Plan Discharge plan remains appropriate;Frequency remains appropriate    Precautions / Restrictions Precautions Precautions: Fall Precaution Comments: wound VAC Required Braces or Orthoses: Other Brace/Splint Other Brace/Splint: Darco Shoe Restrictions Weight Bearing Restrictions: Yes RLE Weight Bearing: Weight bearing as tolerated   Pertinent Vitals/Pain 3/10 R Foot pain.    Mobility  Bed Mobility Bed Mobility: Sit to Supine Sit to Supine: With rail;5: Supervision Details for Bed Mobility Assistance: Supervision for vc to shift position.  Refused giat belt. Transfers Transfers: Sit to Stand;Stand to Sit Sit to Stand: 5: Supervision;From chair/3-in-1;With armrests Stand to Sit: 5: Supervision;With armrests;To bed Details for Transfer Assistance: increased time , riminder to place both hands back on recliner before sitting down Ambulation/Gait Ambulation/Gait Assistance: 5: Supervision Ambulation Distance (Feet): 60 Feet Assistive device: Rolling walker Ambulation/Gait Assistance Details: Walked to window and wouldn't ambulate farther. Gait Pattern: Step-to pattern;Decreased step length - left;Decreased stance time - right;Antalgic;Trunk flexed Gait velocity: decreased     Exercises     PT Diagnosis:    PT Problem List:   PT Treatment Interventions:     PT Goals Acute Rehab PT Goals PT Goal:  Sit to Supine/Side - Progress: Progressing toward goal PT Goal: Sit to Stand - Progress: Progressing toward goal PT Goal: Stand to Sit - Progress: Progressing toward goal PT Goal: Ambulate - Progress: Progressing toward goal PT Goal: Perform Home Exercise Program - Progress: Progressing toward goal  Visit Information  Last PT Received On: 06/02/12 Assistance Needed: +1    Subjective Data      Cognition  Cognition Arousal/Alertness: Awake/alert Behavior During Therapy: WFL for tasks assessed/performed Overall Cognitive Status: Within Functional Limits for tasks assessed    Balance     End of Session PT - End of Session Activity Tolerance: Patient tolerated treatment well Patient left: with call bell/phone within reach;in bed   GP     [facility], Oluwatobi Ruppe JEAN SPTA 06/02/2012, 2:18 PM

## 2012-06-02 NOTE — Progress Notes (Signed)
Subjective: Interval History: none.  Objective: Vital signs in last 24 hours: Temp:  [98.5 F (36.9 C)-99.3 F (37.4 C)] 99 F (37.2 C) (04/25 0601) Pulse Rate:  [66-92] 80 (04/25 0936) Resp:  [16-18] 16 (04/25 0601) BP: (105-157)/(55-70) 124/60 mmHg (04/25 0936) SpO2:  [98 %-100 %] 98 % (04/25 0601) Weight change:   Intake/Output from previous day: 04/24 0701 - 04/25 0700 In: 2035.8 [P.O.:480; I.V.:1555.8] Out: 1310 [Urine:1300; Drains:10] Intake/Output this shift:    General appearance: alert, cooperative, moderately obese and pale Resp: diminished breath sounds bilaterally Cardio: systolic murmur: holosystolic 2/6, blowing at apex GI: pos bs, obese, soft,  Extremities: edema 2+, PICC R arm  Lab Results:  Recent Labs  06/02/12 0500  WBC 5.4  HGB 7.5*  HCT 22.1*  PLT 145*   BMET:  Recent Labs  06/01/12 0444 06/02/12 0500  NA 139 139  K 4.0 3.9  CL 102 102  CO2 25 24  GLUCOSE 125* 178*  BUN 39* 46*  CREATININE 4.08* 4.35*  CALCIUM 8.5 8.4   No results found for this basename: PTH,  in the last 72 hours Iron Studies: No results found for this basename: IRON, TIBC, TRANSFERRIN, FERRITIN,  in the last 72 hours  Studies/Results: US Renal  06/01/2012  *RADIOLOGY REPORT*  Clinical Data: Acute on chronic renal failure.  RENAL/URINARY TRACT ULTRASOUND COMPLETE  Comparison:  CT dated 03/29/2011  Findings:  Right Kidney:  The right kidney measures approximately 13.8 cm in length. There is suggestion of mild cortical atrophy and mildly increased cortical echogenicity.  No hydronephrosis or focal lesion is identified.  Left Kidney:  The left kidney measures 14 cm in length.  No atrophy, hydronephrosis, abnormal echogenicity or focal lesion is identified.  Bladder:  Bladder is moderately distended at the time of imaging and unremarkable in appearance.  IMPRESSION: No evidence of hydronephrosis.  Equal sized kidneys with the right demonstrating suggestion of mild cortical  atrophy and increased cortical echogenicity.   Original Report Authenticated By: Irish Lack, M.D.     I have reviewed the patient's current medications.  Assessment/Plan: 1 AKI Cr still rising, nonoliguric.  Acid/base, K ok vol xs.  2 DM controlled 3 PVD/AMP / Cellulitis per Ortho 4 Obesity 5 ^ Lipids 6 Anemia will check Fe 7 HPTH check  P Check Fe, stop ivf, adjust meds Rec D/C PICC  LOS: 4 days   Juanpablo Ciresi L 06/02/2012,10:47 AM

## 2012-06-03 DIAGNOSIS — L0390 Cellulitis, unspecified: Secondary | ICD-10-CM

## 2012-06-03 DIAGNOSIS — N179 Acute kidney failure, unspecified: Secondary | ICD-10-CM

## 2012-06-03 LAB — CBC
HCT: 24.4 % — ABNORMAL LOW (ref 39.0–52.0)
HCT: 24.5 % — ABNORMAL LOW (ref 39.0–52.0)
Hemoglobin: 8.5 g/dL — ABNORMAL LOW (ref 13.0–17.0)
MCH: 26.4 pg (ref 26.0–34.0)
MCHC: 33.2 g/dL (ref 30.0–36.0)
MCHC: 34.7 g/dL (ref 30.0–36.0)
MCV: 76.5 fL — ABNORMAL LOW (ref 78.0–100.0)
MCV: 76.1 fL — ABNORMAL LOW (ref 78.0–100.0)
Platelets: 173 10*3/uL (ref 150–400)
Platelets: 175 10*3/uL (ref 150–400)
RBC: 3.22 MIL/uL — ABNORMAL LOW (ref 4.22–5.81)
RDW: 14.7 % (ref 11.5–15.5)
RDW: 14.6 % (ref 11.5–15.5)
WBC: 6 10*3/uL (ref 4.0–10.5)
WBC: 6 10*3/uL (ref 4.0–10.5)

## 2012-06-03 LAB — RENAL FUNCTION PANEL
Albumin: 2.5 g/dL — ABNORMAL LOW (ref 3.5–5.2)
BUN: 55 mg/dL — ABNORMAL HIGH (ref 6–23)
BUN: 56 mg/dL — ABNORMAL HIGH (ref 6–23)
CO2: 23 mEq/L (ref 19–32)
Chloride: 105 mEq/L (ref 96–112)
Chloride: 106 mEq/L (ref 96–112)
Creatinine, Ser: 4.8 mg/dL — ABNORMAL HIGH (ref 0.50–1.35)
Creatinine, Ser: 4.7 mg/dL — ABNORMAL HIGH (ref 0.50–1.35)
GFR calc non Af Amer: 13 mL/min — ABNORMAL LOW (ref >90)
GFR calc non Af Amer: 13 mL/min — ABNORMAL LOW (ref >90)
Phosphorus: 6.2 mg/dL — ABNORMAL HIGH (ref 2.3–4.6)
Potassium: 4.4 mEq/L (ref 3.5–5.1)

## 2012-06-03 LAB — GLUCOSE, CAPILLARY
Glucose-Capillary: 86 mg/dL (ref 70–99)
Glucose-Capillary: 88 mg/dL (ref 70–99)
Glucose-Capillary: 180 mg/dL — ABNORMAL HIGH (ref 70–99)
Glucose-Capillary: 94 mg/dL (ref 70–99)

## 2012-06-03 LAB — ANAEROBIC CULTURE

## 2012-06-03 MED ORDER — DARBEPOETIN ALFA-POLYSORBATE 150 MCG/0.3ML IJ SOLN
150.0000 ug | INTRAMUSCULAR | Status: DC
  Administered 2012-06-03: 150 ug via SUBCUTANEOUS
  Filled 2012-06-03: qty 0.3

## 2012-06-03 MED ORDER — SEVELAMER CARBONATE 2.4 G PO PACK
2.4000 g | PACK | Freq: Three times a day (TID) | ORAL | Status: DC
  Administered 2012-06-03 – 2012-06-06 (×9): 2.4 g via ORAL
  Filled 2012-06-03 (×14): qty 1

## 2012-06-03 MED ORDER — SODIUM CHLORIDE 0.9 % IV SOLN
1020.0000 mg | Freq: Once | INTRAVENOUS | Status: AC
  Administered 2012-06-03: 1020 mg via INTRAVENOUS
  Filled 2012-06-03: qty 34

## 2012-06-03 NOTE — Progress Notes (Signed)
Subjective: Interval History: has complaints tired of being in bed.  Objective: Vital signs in last 24 hours: Temp:  [97.4 F (36.3 C)-98.9 F (37.2 C)] 97.4 F (36.3 C) (04/26 0531) Pulse Rate:  [77-90] 81 (04/26 0531) Resp:  [17-18] 17 (04/26 0531) BP: (125-140)/(67-88) 125/68 mmHg (04/26 0531) SpO2:  [97 %-99 %] 98 % (04/26 0531) Weight change:   Intake/Output from previous day:   Intake/Output this shift: Total I/O In: 240 [P.O.:240] Out: 500 [Urine:500]  General appearance: alert, cooperative, moderately obese and pale Resp: diminished breath sounds bilaterally Cardio: S1, S2 normal and systolic murmur: holosystolic 2/6, blowing at apex GI: obese,liver down 5 cm, pos bs Extremities: edema 3+  Lab Results:  Recent Labs  06/02/12 0500 06/03/12 0715  WBC 5.4 6.0  HGB 7.5* 8.1*  HCT 22.1* 24.4*  PLT 145* 173   BMET:  Recent Labs  06/02/12 0500 06/03/12 0715  NA 139 142  K 3.9 4.5  CL 102 105  CO2 24 24  GLUCOSE 178* 91  BUN 46* 55*  CREATININE 4.35* 4.80*  CALCIUM 8.4 9.0   No results found for this basename: PTH,  in the last 72 hours Iron Studies:  Recent Labs  06/02/12 1100  IRON 21*  TIBC 190*    Studies/Results: US Renal  06/01/2012  *RADIOLOGY REPORT*  Clinical Data: Acute on chronic renal failure.  RENAL/URINARY TRACT ULTRASOUND COMPLETE  Comparison:  CT dated 03/29/2011  Findings:  Right Kidney:  The right kidney measures approximately 13.8 cm in length. There is suggestion of mild cortical atrophy and mildly increased cortical echogenicity.  No hydronephrosis or focal lesion is identified.  Left Kidney:  The left kidney measures 14 cm in length.  No atrophy, hydronephrosis, abnormal echogenicity or focal lesion is identified.  Bladder:  Bladder is moderately distended at the time of imaging and unremarkable in appearance.  IMPRESSION: No evidence of hydronephrosis.  Equal sized kidneys with the right demonstrating suggestion of mild cortical  atrophy and increased cortical echogenicity.   Original Report Authenticated By: Irish Lack, M.D.     I have reviewed the patient's current medications.  Assessment/Plan: 1 AKI/ckd vol xs but no sx. Acid/base/k ok but GFR lower.  Appears toxic ATN . Worsening 2 Anemia Fe low,give , needs epo 3 DM controlled 4 Celllulitis/PVD per ortho 5 Obesity 6 HPTH P IV Fe, epo, limit vol , diet. Control bs. AB    LOS: 5 days   Koree Schopf L 06/03/2012,10:47 AM

## 2012-06-03 NOTE — Progress Notes (Signed)
Patient ID: Lawrence [name], male   DOB: [birth year], 52 y.o.   MRN: [id number] PATIENT ID: Lawrence [name]        MRN:  [id number]          DOB/AGE: 07/04/1960 / 52 y.o.    Lawrence Campbell, MD   Jacqualine Code, PA-C [address] [place], [place]  [address]                             [phone]   PROGRESS NOTE  Subjective:  negative for Chest Pain  negative for Shortness of Breath  negative for Nausea/Vomiting   negative for Calf Pain    Tolerating Diet: yes         Patient reports pain as mild and moderate.     Resting comfortably.  Wants to go home.  Objective: Vital signs in last 24 hours:   Patient Vitals for the past 24 hrs:  BP Temp Pulse Resp SpO2  06/03/12 0531 125/68 mmHg 97.4 F (36.3 C) 81 17 98 %  06/02/12 2133 140/88 mmHg 98.4 F (36.9 C) 90 18 97 %  06/02/12 1453 125/67 mmHg 98.9 F (37.2 C) 77 18 99 %      Intake/Output from previous day:       Intake/Output this shift:   04/26 0701 - 04/26 1900 In: 240 [P.O.:240] Out: 500 [Urine:500]   Intake/Output     04/25 0701 - 04/26 0700 04/26 0701 - 04/27 0700   P.O.  240   I.V. (mL/kg)     Total Intake(mL/kg)  240 (2.8)   Urine (mL/kg/hr)  500 (1.8)   Drains     Total Output 0 500   Net 0 -260           LABORATORY DATA:  Recent Labs  06/02/12 0500 06/03/12 0715  WBC 5.4 6.0  HGB 7.5* 8.1*  HCT 22.1* 24.4*  PLT 145* 173    Recent Labs  05/31/12 0500 06/01/12 0444 06/02/12 0500 06/03/12 0715  NA 143 139 139 142  K 3.6 4.0 3.9 4.5  CL 106 102 102 105  CO2 28 25 24 24   BUN 33* 39* 46* 55*  CREATININE 3.33* 4.08* 4.35* 4.80*  GLUCOSE 67* 125* 178* 91  CALCIUM 8.5 8.5 8.4 9.0   No results found for this basename: INR, PROTIME    Recent Radiographic Studies :  US Renal  06/01/2012  *RADIOLOGY REPORT*  Clinical Data: Acute on chronic renal failure.  RENAL/URINARY TRACT ULTRASOUND COMPLETE  Comparison:  CT dated 03/29/2011  Findings:  Right Kidney:  The right kidney measures  approximately 13.8 cm in length. There is suggestion of mild cortical atrophy and mildly increased cortical echogenicity.  No hydronephrosis or focal lesion is identified.  Left Kidney:  The left kidney measures 14 cm in length.  No atrophy, hydronephrosis, abnormal echogenicity or focal lesion is identified.  Bladder:  Bladder is moderately distended at the time of imaging and unremarkable in appearance.  IMPRESSION: No evidence of hydronephrosis.  Equal sized kidneys with the right demonstrating suggestion of mild cortical atrophy and increased cortical echogenicity.   Original Report Authenticated By: Irish Lack, M.D.    Mr Foot Right W Wo Contrast  05/26/2012  *RADIOLOGY REPORT*  Clinical Data: Right forefoot pain and redness, swelling and drainage.  Status post amputation of the great toe 2 weeks ago.  MRI OF THE RIGHT FOREFOOT WITHOUT AND WITH CONTRAST  Technique:  Multiplanar, multisequence MR imaging was performed both before and after administration of intravenous contrast.  Contrast: 18mL MULTIHANCE GADOBENATE DIMEGLUMINE 529 MG/ML IV SOLN  Comparison: Plain films 04/18/2012.  Findings: The patient is status post amputation at the level of the mid first metatarsal.  There is marrow edema and enhancement in the remaining distal 2.0 cm of the first metatarsal.  Edema and enhancement are also seen in the distal 1.7 cm of the second metatarsal and proximal 1.5 cm of the proximal phalanx of the second toe. Small subchondral cyst is seen in the middle cuneiform. Bone marrow signal is otherwise unremarkable.  There is extensive soft tissue edema diffusely about the foot.  A focal wound at the skin surface is seen at the amputation site. Deep to the wound, there is fluid worrisome for abscess measuring 1.8 cm transverse by 1.2 cm cranial-caudal by 1.1 cm long.  No other fluid collection is identified.  IMPRESSION:  1.  Findings consistent with osteomyelitis in the distal 1.7 cm of the second metatarsal and  proximal 1.5 cm of the proximal phalanx of the second toe. 2.  Status post amputation of the first ray at the level of the mid first metatarsal.  Edema and enhancement in the distal 2 cm of the remaining first metatarsal could be postoperative but is highly worrisome for osteomyelitis.  Skin wound is seen at the amputation site with underlying fluid worrisome for abscess.   Original Report Authenticated By: Holley Dexter, M.D.      Examination:  General appearance: alert, cooperative and mild distress  Wound Exam: vac   Sensory Exam: present to light touch   Assessment:    5 Days Post-Op  Procedure(s) (LRB): IRRIGATION AND DEBRIDEMENT EXTREMITY- right foot (Right)  ADDITIONAL DIAGNOSIS:  Active Problems:   Post-operative wound abscess     Plan: Continue Wound Vac Renal to evaluate increasing creatinine.  Already on as consultants.         [facility] 06/03/2012 10:16 AM

## 2012-06-04 LAB — GLUCOSE, CAPILLARY
Glucose-Capillary: 93 mg/dL (ref 70–99)
Glucose-Capillary: 211 mg/dL — ABNORMAL HIGH (ref 70–99)

## 2012-06-04 LAB — RENAL FUNCTION PANEL
Albumin: 2.6 g/dL — ABNORMAL LOW (ref 3.5–5.2)
BUN: 59 mg/dL — ABNORMAL HIGH (ref 6–23)
Chloride: 106 mEq/L (ref 96–112)
GFR calc Af Amer: 15 mL/min — ABNORMAL LOW (ref >90)
Phosphorus: 6 mg/dL — ABNORMAL HIGH (ref 2.3–4.6)
Potassium: 4.3 mEq/L (ref 3.5–5.1)
Sodium: 143 mEq/L (ref 135–145)

## 2012-06-04 LAB — CBC
HCT: 26.4 % — ABNORMAL LOW (ref 39.0–52.0)
Hemoglobin: 8.8 g/dL — ABNORMAL LOW (ref 13.0–17.0)
MCHC: 33.3 g/dL (ref 30.0–36.0)
RDW: 15.1 % (ref 11.5–15.5)
WBC: 5 10*3/uL (ref 4.0–10.5)

## 2012-06-04 NOTE — Progress Notes (Signed)
Subjective: Interval History: has complaints wants to  Go home.  Objective: Vital signs in last 24 hours: Temp:  [98 F (36.7 C)-98.7 F (37.1 C)] 98 F (36.7 C) (04/27 0636) Pulse Rate:  [77-82] 77 (04/27 0636) Resp:  [16-18] 16 (04/27 0636) BP: (139-150)/(66-78) 139/66 mmHg (04/27 0636) SpO2:  [95 %-98 %] 95 % (04/27 0636) Weight change:   Intake/Output from previous day: 04/26 0701 - 04/27 0700 In: 480 [P.O.:480] Out: 2325 [Urine:2325] Intake/Output this shift: Total I/O In: 240 [P.O.:240] Out: 900 [Urine:900]  General appearance: alert, cooperative, moderately obese and pale Resp: diminished breath sounds bilaterally Cardio: S1, S2 normal and systolic murmur: holosystolic 2/6, blowing at apex GI: obese, pos bs, liver down 6 cm Extremities: edema 2+  Lab Results:  Recent Labs  06/03/12 1100 06/04/12 0640  WBC 6.0 5.0  HGB 8.5* 8.8*  HCT 24.5* 26.4*  PLT 175 177   BMET:  Recent Labs  06/03/12 1100 06/04/12 0640  NA 143 143  K 4.4 4.3  CL 106 106  CO2 23 21  GLUCOSE 92 101*  BUN 56* 59*  CREATININE 4.70* 4.73*  CALCIUM 9.0 9.4   No results found for this basename: PTH,  in the last 72 hours Iron Studies:  Recent Labs  06/02/12 1100  IRON 21*  TIBC 190*    Studies/Results: No results found.  I have reviewed the patient's current medications.  Assessment/Plan: 1 AKI Cr plateau. Mild acidemia,vol xs, K ok.  O>I may be starting recovery and mobilizing fluid 2 Anemia give Fe/epo 3 CKD 3 4 DM controlled 5 PVD/cellulitis per ortho 6 HTN controlled with amlod P limit fluids, AB, if Cr starts to fall can f/u outpatient.    LOS: 6 days   Jacobe Study L 06/04/2012,10:36 AM

## 2012-06-04 NOTE — Progress Notes (Signed)
Patient ID: Lawrence [name], male   DOB: 04-14-1960, 52 y.o.   MRN: [id number] PATIENT ID: Lawrence [name]        MRN:  [id number]          DOB/AGE: 05-14-1960 / 52 y.o.    [name], MD   Jacqualine Code, PA-C [address] [place], [place]  [address]                             [phone]   PROGRESS NOTE  Subjective:  negative for Chest Pain  negative for Shortness of Breath  negative for Nausea/Vomiting   negative for Calf Pain    Tolerating Diet: yes         Patient reports pain as mild.     Vac dressing in place-no external drainage, afebrile  Objective: Vital signs in last 24 hours:   Patient Vitals for the past 24 hrs:  BP Temp Temp src Pulse Resp SpO2  06/04/12 0636 139/66 mmHg 98 F (36.7 C) Oral 77 16 95 %  06/03/12 2022 150/77 mmHg 98.5 F (36.9 C) Oral 82 18 95 %  06/03/12 1431 141/78 mmHg 98.7 F (37.1 C) - 79 18 98 %      Intake/Output from previous day:   04/26 0701 - 04/27 0700 In: 480 [P.O.:480] Out: 2325 [Urine:2325]   Intake/Output this shift:   04/27 0701 - 04/27 1900 In: 240 [P.O.:240] Out: 200 [Urine:200]   Intake/Output     04/26 0701 - 04/27 0700 04/27 0701 - 04/28 0700   P.O. 480 240   Total Intake(mL/kg) 480 (5.5) 240 (2.8)   Urine (mL/kg/hr) 2325 (1.1) 200 (0.8)   Total Output 2325 200   Net -1845 +40           LABORATORY DATA:  Recent Labs  06/02/12 0500 06/03/12 0715 06/03/12 1100 06/04/12 0640  WBC 5.4 6.0 6.0 5.0  HGB 7.5* 8.1* 8.5* 8.8*  HCT 22.1* 24.4* 24.5* 26.4*  PLT 145* 173 175 177    Recent Labs  05/31/12 0500 06/01/12 0444 06/02/12 0500 06/03/12 0715 06/03/12 1100 06/04/12 0640  NA 143 139 139 142 143 143  K 3.6 4.0 3.9 4.5 4.4 4.3  CL 106 102 102 105 106 106  CO2 28 25 24 24 23 21   BUN 33* 39* 46* 55* 56* 59*  CREATININE 3.33* 4.08* 4.35* 4.80* 4.70* 4.73*  GLUCOSE 67* 125* 178* 91 92 101*  CALCIUM 8.5 8.5 8.4 9.0 9.0 9.4   No results found for this basename: INR, PROTIME    Recent  Radiographic Studies :  US Renal  06/01/2012  *RADIOLOGY REPORT*  Clinical Data: Acute on chronic renal failure.  RENAL/URINARY TRACT ULTRASOUND COMPLETE  Comparison:  CT dated 03/29/2011  Findings:  Right Kidney:  The right kidney measures approximately 13.8 cm in length. There is suggestion of mild cortical atrophy and mildly increased cortical echogenicity.  No hydronephrosis or focal lesion is identified.  Left Kidney:  The left kidney measures 14 cm in length.  No atrophy, hydronephrosis, abnormal echogenicity or focal lesion is identified.  Bladder:  Bladder is moderately distended at the time of imaging and unremarkable in appearance.  IMPRESSION: No evidence of hydronephrosis.  Equal sized kidneys with the right demonstrating suggestion of mild cortical atrophy and increased cortical echogenicity.   Original Report Authenticated By: Irish Lack, M.D.    Mr Foot Right W Wo Contrast  05/26/2012  *RADIOLOGY  REPORT*  Clinical Data: Right forefoot pain and redness, swelling and drainage.  Status post amputation of the great toe 2 weeks ago.  MRI OF THE RIGHT FOREFOOT WITHOUT AND WITH CONTRAST  Technique:  Multiplanar, multisequence MR imaging was performed both before and after administration of intravenous contrast.  Contrast: 18mL MULTIHANCE GADOBENATE DIMEGLUMINE 529 MG/ML IV SOLN  Comparison: Plain films 04/18/2012.  Findings: The patient is status post amputation at the level of the mid first metatarsal.  There is marrow edema and enhancement in the remaining distal 2.0 cm of the first metatarsal.  Edema and enhancement are also seen in the distal 1.7 cm of the second metatarsal and proximal 1.5 cm of the proximal phalanx of the second toe. Small subchondral cyst is seen in the middle cuneiform. Bone marrow signal is otherwise unremarkable.  There is extensive soft tissue edema diffusely about the foot.  A focal wound at the skin surface is seen at the amputation site. Deep to the wound, there is  fluid worrisome for abscess measuring 1.8 cm transverse by 1.2 cm cranial-caudal by 1.1 cm long.  No other fluid collection is identified.  IMPRESSION:  1.  Findings consistent with osteomyelitis in the distal 1.7 cm of the second metatarsal and proximal 1.5 cm of the proximal phalanx of the second toe. 2.  Status post amputation of the first ray at the level of the mid first metatarsal.  Edema and enhancement in the distal 2 cm of the remaining first metatarsal could be postoperative but is highly worrisome for osteomyelitis.  Skin wound is seen at the amputation site with underlying fluid worrisome for abscess.   Original Report Authenticated By: Holley Dexter, M.D.      Exam: moving right foot without pain  Assessment:    6 Days Post-Op  Procedure(s) (LRB): IRRIGATION AND DEBRIDEMENT EXTREMITY- right foot (Right)    No  Problem with vac dressing,to be D/C'd when medical status improved     Valeria Batman 06/04/2012 9:49 AM

## 2012-06-05 LAB — GLUCOSE, CAPILLARY
Glucose-Capillary: 160 mg/dL — ABNORMAL HIGH (ref 70–99)
Glucose-Capillary: 119 mg/dL — ABNORMAL HIGH (ref 70–99)
Glucose-Capillary: 148 mg/dL — ABNORMAL HIGH (ref 70–99)

## 2012-06-05 LAB — COMPREHENSIVE METABOLIC PANEL
Albumin: 2.8 g/dL — ABNORMAL LOW (ref 3.5–5.2)
Alkaline Phosphatase: 111 U/L (ref 39–117)
BUN: 58 mg/dL — ABNORMAL HIGH (ref 6–23)
Calcium: 9.7 mg/dL (ref 8.4–10.5)
Creatinine, Ser: 4.79 mg/dL — ABNORMAL HIGH (ref 0.50–1.35)
GFR calc Af Amer: 15 mL/min — ABNORMAL LOW (ref >90)
Glucose, Bld: 149 mg/dL — ABNORMAL HIGH (ref 70–99)
Potassium: 4.5 mEq/L (ref 3.5–5.1)
Total Protein: 6.9 g/dL (ref 6.0–8.3)

## 2012-06-05 LAB — PARATHYROID HORMONE, INTACT (NO CA): PTH: 322.8 pg/mL — ABNORMAL HIGH (ref 14.0–72.0)

## 2012-06-05 LAB — CBC
HCT: 26.7 % — ABNORMAL LOW (ref 39.0–52.0)
MCH: 25.7 pg — ABNORMAL LOW (ref 26.0–34.0)
MCHC: 33.7 g/dL (ref 30.0–36.0)
MCV: 76.3 fL — ABNORMAL LOW (ref 78.0–100.0)
Platelets: 203 10*3/uL (ref 150–400)
RDW: 15.2 % (ref 11.5–15.5)
WBC: 5 10*3/uL (ref 4.0–10.5)

## 2012-06-05 MED ORDER — LEVOFLOXACIN 500 MG PO TABS: 500.0000 mg | ORAL_TABLET | ORAL | Status: DC

## 2012-06-05 MED ORDER — AMLODIPINE BESYLATE 5 MG PO TABS: 5.0000 mg | ORAL_TABLET | Freq: Every day | ORAL | Status: DC

## 2012-06-05 MED ORDER — METRONIDAZOLE 500 MG PO TABS: 500.0000 mg | ORAL_TABLET | Freq: Three times a day (TID) | ORAL | Status: DC

## 2012-06-05 MED ORDER — TEMAZEPAM 15 MG PO CAPS
30.0000 mg | ORAL_CAPSULE | Freq: Every evening | ORAL | Status: DC | PRN
  Administered 2012-06-05: 30 mg via ORAL
  Filled 2012-06-05: qty 2

## 2012-06-05 MED ORDER — HYDROCODONE-ACETAMINOPHEN 5-325 MG PO TABS: 1.0000 | ORAL_TABLET | ORAL | Status: DC | PRN

## 2012-06-05 NOTE — Progress Notes (Signed)
Patient ID: Lawrence [name], male   DOB: 1960/08/12, 52 y.o.   MRN: [id number] S:wants to go home O:BP 136/75  Pulse 85  Temp(Src) 98.3 F (36.8 C) (Oral)  Resp 16  Ht 5' 4.96" (1.65 m)  Wt 87.1 kg (192 lb 0.3 oz)  BMI 31.99 kg/m2  SpO2 96%  Intake/Output Summary (Last 24 hours) at 06/05/12 1141 Last data filed at 06/04/12 2100  Gross per 24 hour  Intake    360 ml  Output    650 ml  Net   -290 ml   Intake/Output: I/O last 3 completed shifts: In: 720 [P.O.:720] Out: 2200 [Urine:2200]  Intake/Output this shift:    Weight change:  Gen:WD obese WM in NAD CVS:no rub Resp:cta JYN:WGNFAO Ext:+edema   Recent Labs Lab 05/31/12 0500 06/01/12 0444 06/02/12 0500 06/03/12 0715 06/03/12 1100 06/04/12 0640 06/05/12 0729  NA 143 139 139 142 143 143 141  K 3.6 4.0 3.9 4.5 4.4 4.3 4.5  CL 106 102 102 105 106 106 104  CO2 28 25 24 24 23 21 26   GLUCOSE 67* 125* 178* 91 92 101* 149*  BUN 33* 39* 46* 55* 56* 59* 58*  CREATININE 3.33* 4.08* 4.35* 4.80* 4.70* 4.73* 4.79*  ALBUMIN  --   --  2.3* 2.5* 2.7* 2.6* 2.8*  CALCIUM 8.5 8.5 8.4 9.0 9.0 9.4 9.7  PHOS  --   --  5.2* 6.2* 5.9* 6.0* 5.7*  AST  --   --  14  --   --   --  15  ALT  --   --  11  --   --   --  10   Liver Function Tests:  Recent Labs Lab 06/02/12 0500  06/03/12 1100 06/04/12 0640 06/05/12 0729  AST 14  --   --   --  15  ALT 11  --   --   --  10  ALKPHOS 108  --   --   --  111  BILITOT 0.2*  --   --   --  0.2*  PROT 5.9*  --   --   --  6.9  ALBUMIN 2.3*  < > 2.7* 2.6* 2.8*  < > = values in this interval not displayed. No results found for this basename: LIPASE, AMYLASE,  in the last 168 hours No results found for this basename: AMMONIA,  in the last 168 hours CBC:  Recent Labs Lab 06/02/12 0500 06/03/12 0715 06/03/12 1100 06/04/12 0640 06/05/12 0729  WBC 5.4 6.0 6.0 5.0 5.0  NEUTROABS 4.0  --   --   --   --   HGB 7.5* 8.1* 8.5* 8.8* 9.0*  HCT 22.1* 24.4* 24.5* 26.4* 26.7*  MCV 75.9* 76.5* 76.1*  75.9* 76.3*  PLT 145* 173 175 177 203   Cardiac Enzymes: No results found for this basename: CKTOTAL, CKMB, CKMBINDEX, TROPONINI,  in the last 168 hours CBG:  Recent Labs Lab 06/04/12 1100 06/04/12 1633 06/04/12 2205 06/05/12 0640 06/05/12 1110  GLUCAP 136* 184* 211* 160* 165*    Iron Studies: No results found for this basename: IRON, TIBC, TRANSFERRIN, FERRITIN,  in the last 72 hours Studies/Results: No results found. Marland Kitchen amLODipine  5 mg Oral QHS  . aspirin  81 mg Oral q morning - 10a  . darbepoetin (ARANESP) injection - NON-DIALYSIS  150 mcg Subcutaneous Q Sat-1800  . docusate sodium  100 mg Oral BID  . insulin aspart  0-5 Units Subcutaneous QHS  . insulin aspart  0-9 Units Subcutaneous TID WC  . insulin glargine  15 Units Subcutaneous QHS  . levofloxacin  500 mg Oral Q48H  . metoCLOPramide  10 mg Oral TID AC   Or  . metoCLOPramide (REGLAN) injection  5-10 mg Intravenous TID AC  . metoprolol tartrate  25 mg Oral BID  . metroNIDAZOLE  500 mg Oral Q8H  . pantoprazole  40 mg Oral Daily  . sevelamer carbonate  2.4 g Oral TID WC    BMET    Component Value Date/Time   NA 141 06/05/2012 0729   K 4.5 06/05/2012 0729   CL 104 06/05/2012 0729   CO2 26 06/05/2012 0729   GLUCOSE 149* 06/05/2012 0729   BUN 58* 06/05/2012 0729   CREATININE 4.79* 06/05/2012 0729   CREATININE 1.05 02/22/2012 0956   CALCIUM 9.7 06/05/2012 0729   GFRNONAA 13* 06/05/2012 0729   GFRAA 15* 06/05/2012 0729   CBC    Component Value Date/Time   WBC 5.0 06/05/2012 0729   RBC 3.50* 06/05/2012 0729   HGB 9.0* 06/05/2012 0729   HCT 26.7* 06/05/2012 0729   PLT 203 06/05/2012 0729   MCV 76.3* 06/05/2012 0729   MCH 25.7* 06/05/2012 0729   MCHC 33.7 06/05/2012 0729   RDW 15.2 06/05/2012 0729   LYMPHSABS 0.7 06/02/2012 0500   MONOABS 0.5 06/02/2012 0500   EOSABS 0.2 06/02/2012 0500   BASOSABS 0.0 06/02/2012 0500     Assessment:  1. Acute on CKD 3. CKD 3 sec DM/HTN and acute component most likely due to hemodynamic  changes from lisinopril/advil/ infected Rt foot. Less likely but possible is AIN or cholesterol emboli from aortogram last month.  Scr remains elevated without any significant change.  Do not feel comfortable discharging pt with slowly rising Scr.  Hopefully will start to decline in the next 24-48 hours.  Cont to renal dose meds.  Cont to hold ACE and NSAIDS 2. PVD s/p debridement.  Cont with local care and inpt until Scr starts to improve 3. HTN- stable 4. DM-stable 5. ACDz with ABLA- follow. Given epo. Will need f/u with Dr. Waynard Edwards and possibly CKA if renal function does not return to baseline.  6. dispo- hold off on d/c for now until Scr starts to improve [name]

## 2012-06-05 NOTE — Progress Notes (Signed)
Subjective: 7 Days Post-Op Procedure(s) (LRB): IRRIGATION AND DEBRIDEMENT EXTREMITY- right foot (Right) Patient reports pain as mild.  Anxious to go home. Arrangements for [facility] made and equipment in room Has been out of bed and walked with walker and Darko shoe. Will be discharged on po meds Still with elevated Cr.  Nephrology followin  Objective: Vital signs in last 24 hours: Temp:  [98.1 F (36.7 C)-98.3 F (36.8 C)] 98.3 F (36.8 C) (04/28 0636) Pulse Rate:  [78-91] 85 (04/28 0946) Resp:  [16-18] 16 (04/28 0636) BP: (129-136)/(58-75) 136/75 mmHg (04/28 0636) SpO2:  [96 %-97 %] 96 % (04/28 0636)  Intake/Output from previous day: 04/27 0701 - 04/28 0700 In: 600 [P.O.:600] Out: 1550 [Urine:1550] Intake/Output this shift:     Recent Labs  06/03/12 0715 06/03/12 1100 06/04/12 0640 06/05/12 0729  HGB 8.1* 8.5* 8.8* 9.0*    Recent Labs  06/04/12 0640 06/05/12 0729  WBC 5.0 5.0  RBC 3.48* 3.50*  HCT 26.4* 26.7*  PLT 177 203    Recent Labs  06/04/12 0640 06/05/12 0729  NA 143 141  K 4.3 4.5  CL 106 104  CO2 21 26  BUN 59* 58*  CREATININE 4.73* 4.79*  GLUCOSE 101* 149*  CALCIUM 9.4 9.7   No results found for this basename: LABPT, INR,  in the last 72 hours  vac in place and functioning.    Assessment/Plan: 7 Days Post-Op Procedure(s) (LRB): IRRIGATION AND DEBRIDEMENT EXTREMITY- right foot (Right) Discharge home with home health possibly today if ok with renal. Will see later today and make final plans once I have discussed with Dr Joycelyn Das 06/05/2012, 10:14 AM

## 2012-06-05 NOTE — Progress Notes (Signed)
PT Cancellation Note  Patient Details Name: Lawrence [name] MRN: [id number] DOB: 1961-01-03   Cancelled Treatment:    Reason Eval/Treat Not Completed: Other (comment) (" I don't feel like it") Attempted to educate on importance however patient still refusing and states will walk with family later   Fredrich Birks 06/05/2012, 1:48 PM

## 2012-06-05 NOTE — Progress Notes (Signed)
Seen and agreed 06/05/2012 Robinette, Julia Elizabeth PTA [phone] pager [phone] office    

## 2012-06-06 LAB — BASIC METABOLIC PANEL
BUN: 59 mg/dL — ABNORMAL HIGH (ref 6–23)
Chloride: 104 mEq/L (ref 96–112)
Creatinine, Ser: 4.74 mg/dL — ABNORMAL HIGH (ref 0.50–1.35)
GFR calc Af Amer: 15 mL/min — ABNORMAL LOW (ref >90)

## 2012-06-06 NOTE — Progress Notes (Signed)
Patient ID: Lawrence [name], male   DOB: [birth year], 52 y.o.   MRN: [id number] Notified by Dr Ophelia Charter that pt is ready for discharge as he has been cleared by Renal.  RX and AVS done.  Pt ok for discharge.

## 2012-06-06 NOTE — Progress Notes (Signed)
Patient ID: Lawrence [name], male   DOB: 02/02/1961, 52 y.o.   MRN: [id number] S:feels well O:BP 130/63  Pulse 79  Temp(Src) 97.4 F (36.3 C) (Oral)  Resp 16  Ht 5' 4.96" (1.65 m)  Wt 87.1 kg (192 lb 0.3 oz)  BMI 31.99 kg/m2  SpO2 98%  Intake/Output Summary (Last 24 hours) at 06/06/12 1115 Last data filed at 06/06/12 1008  Gross per 24 hour  Intake      0 ml  Output   3085 ml  Net  -3085 ml   Intake/Output: I/O last 3 completed shifts: In: 240 [P.O.:240] Out: 2410 [Urine:2400; Drains:10]  Intake/Output this shift:  Total I/O In: -  Out: 975 [Urine:975] Weight change:  Gen:WD obese WM in NAD CVS:no rub Resp: cta VHQ:IONGE Ext: +tr edema   Recent Labs Lab 06/01/12 0444 06/02/12 0500 06/03/12 0715 06/03/12 1100 06/04/12 0640 06/05/12 0729 06/06/12 0807  NA 139 139 142 143 143 141 140  K 4.0 3.9 4.5 4.4 4.3 4.5 4.7  CL 102 102 105 106 106 104 104  CO2 25 24 24 23 21 26 26   GLUCOSE 125* 178* 91 92 101* 149* 116*  BUN 39* 46* 55* 56* 59* 58* 59*  CREATININE 4.08* 4.35* 4.80* 4.70* 4.73* 4.79* 4.74*  ALBUMIN  --  2.3* 2.5* 2.7* 2.6* 2.8*  --   CALCIUM 8.5 8.4 9.0 9.0 9.4 9.7 9.8  PHOS  --  5.2* 6.2* 5.9* 6.0* 5.7*  --   AST  --  14  --   --   --  15  --   ALT  --  11  --   --   --  10  --    Liver Function Tests:  Recent Labs Lab 06/02/12 0500  06/03/12 1100 06/04/12 0640 06/05/12 0729  AST 14  --   --   --  15  ALT 11  --   --   --  10  ALKPHOS 108  --   --   --  111  BILITOT 0.2*  --   --   --  0.2*  PROT 5.9*  --   --   --  6.9  ALBUMIN 2.3*  < > 2.7* 2.6* 2.8*  < > = values in this interval not displayed. No results found for this basename: LIPASE, AMYLASE,  in the last 168 hours No results found for this basename: AMMONIA,  in the last 168 hours CBC:  Recent Labs Lab 06/02/12 0500 06/03/12 0715 06/03/12 1100 06/04/12 0640 06/05/12 0729  WBC 5.4 6.0 6.0 5.0 5.0  NEUTROABS 4.0  --   --   --   --   HGB 7.5* 8.1* 8.5* 8.8* 9.0*  HCT 22.1*  24.4* 24.5* 26.4* 26.7*  MCV 75.9* 76.5* 76.1* 75.9* 76.3*  PLT 145* 173 175 177 203   Cardiac Enzymes: No results found for this basename: CKTOTAL, CKMB, CKMBINDEX, TROPONINI,  in the last 168 hours CBG:  Recent Labs Lab 06/05/12 0640 06/05/12 1110 06/05/12 1615 06/05/12 2203 06/06/12 0656  GLUCAP 160* 165* 119* 148* 112*    Iron Studies: No results found for this basename: IRON, TIBC, TRANSFERRIN, FERRITIN,  in the last 72 hours Studies/Results: No results found. Marland Kitchen amLODipine  5 mg Oral QHS  . aspirin  81 mg Oral q morning - 10a  . darbepoetin (ARANESP) injection - NON-DIALYSIS  150 mcg Subcutaneous Q Sat-1800  . docusate sodium  100 mg Oral BID  . insulin aspart  0-5 Units Subcutaneous QHS  . insulin aspart  0-9 Units Subcutaneous TID WC  . insulin glargine  15 Units Subcutaneous QHS  . levofloxacin  500 mg Oral Q48H  . metoCLOPramide  10 mg Oral TID AC   Or  . metoCLOPramide (REGLAN) injection  5-10 mg Intravenous TID AC  . metoprolol tartrate  25 mg Oral BID  . metroNIDAZOLE  500 mg Oral Q8H  . pantoprazole  40 mg Oral Daily  . sevelamer carbonate  2.4 g Oral TID WC    BMET    Component Value Date/Time   NA 140 06/06/2012 0807   K 4.7 06/06/2012 0807   CL 104 06/06/2012 0807   CO2 26 06/06/2012 0807   GLUCOSE 116* 06/06/2012 0807   BUN 59* 06/06/2012 0807   CREATININE 4.74* 06/06/2012 0807   CREATININE 1.05 02/22/2012 0956   CALCIUM 9.8 06/06/2012 0807   GFRNONAA 13* 06/06/2012 0807   GFRAA 15* 06/06/2012 0807   CBC    Component Value Date/Time   WBC 5.0 06/05/2012 0729   RBC 3.50* 06/05/2012 0729   HGB 9.0* 06/05/2012 0729   HCT 26.7* 06/05/2012 0729   PLT 203 06/05/2012 0729   MCV 76.3* 06/05/2012 0729   MCH 25.7* 06/05/2012 0729   MCHC 33.7 06/05/2012 0729   RDW 15.2 06/05/2012 0729   LYMPHSABS 0.7 06/02/2012 0500   MONOABS 0.5 06/02/2012 0500   EOSABS 0.2 06/02/2012 0500   BASOSABS 0.0 06/02/2012 0500     Assessment:  1. Acute on CKD 3. CKD 3 sec DM/HTN and  acute component most likely due to hemodynamic changes from lisinopril/advil/ infected Rt foot. Less likely but possible is AIN or cholesterol emboli from aortogram last month. Marked improvement of UOP and Scr has started to improve although overall without any significant change in GFR. Given increasing UOP, Okay for discharge to home today and f/u with his PCP Dr. Gillie Manners next week.  Will also schedule hospital follow up with Dr. Briant Cedar on 06/27/12 at 3:30pm.  Our business card with address, contact number and appointment info was given to patient.  We also discussed holding lisinopril/hctz, metformin, and all NSAIDs.  Will need weekly BMP after discharge through the [facility] practice office here at [facility]. 2. PVD s/p debridement. Cont with local care and inpt until Scr starts to improve 3. HTN- stable 4. DM-stable 5. ACDz with ABLA- follow. Given epo. Will need f/u with Dr. Waynard Edwards and possibly CKA if renal function does not return to baseline.  6. dispo- d/c to home per ortho with above f/u.   7.   Lawrence [name]

## 2012-06-06 NOTE — Progress Notes (Signed)
Subjective: 8 Days Post-Op Procedure(s) (LRB): IRRIGATION AND DEBRIDEMENT EXTREMITY- right foot (Right) Patient reports pain as mild.    Objective: Vital signs in last 24 hours: Temp:  [97.4 F (36.3 C)-98.2 F (36.8 C)] 97.4 F (36.3 C) (04/29 1610) Pulse Rate:  [74-85] 74 (04/29 0608) Resp:  [16-18] 16 (04/29 0608) BP: (130-143)/(63-82) 130/63 mmHg (04/29 0608) SpO2:  [97 %-98 %] 98 % (04/29 0608)  Intake/Output from previous day: 04/28 0701 - 04/29 0700 In: -  Out: 2110 [Urine:2100; Drains:10] Intake/Output this shift:     Recent Labs  06/03/12 1100 06/04/12 0640 06/05/12 0729  HGB 8.5* 8.8* 9.0*    Recent Labs  06/04/12 0640 06/05/12 0729  WBC 5.0 5.0  RBC 3.48* 3.50*  HCT 26.4* 26.7*  PLT 177 203    Recent Labs  06/04/12 0640 06/05/12 0729  NA 143 141  K 4.3 4.5  CL 106 104  CO2 21 26  BUN 59* 58*  CREATININE 4.73* 4.79*  GLUCOSE 101* 149*  CALCIUM 9.4 9.7   No results found for this basename: LABPT, INR,  in the last 72 hours  vac working.   Assessment/Plan: 8 Days Post-Op Procedure(s) (LRB): IRRIGATION AND DEBRIDEMENT EXTREMITY- right foot (Right) Plan:      BMET  Once creatinine goes down he can be discharged.   Lawrence [name],Eryck C 06/06/2012, 7:50 AM

## 2012-06-06 NOTE — Progress Notes (Signed)
Discharge instructions reviewed with patient and sister. All questions answered. Patient set up with match program for medications. Follow up appointments, and medications needing to be stopped discussed with patient. Home Wound Vac applied to patients RLE. Care coordinator informed that dressing changes will be needed Monday, Wednesday, and Friday.

## 2012-06-06 NOTE — Progress Notes (Signed)
Changed wound vac yesterday 06/05/2012, at 1200.

## 2012-06-13 ENCOUNTER — Ambulatory Visit (INDEPENDENT_AMBULATORY_CARE_PROVIDER_SITE_OTHER): Payer: Self-pay | Admitting: Family Medicine

## 2012-06-13 ENCOUNTER — Encounter: Payer: Self-pay | Admitting: Family Medicine

## 2012-06-13 VITALS — BP 131/78 | HR 84 | Ht 65.0 in | Wt 190.0 lb

## 2012-06-13 DIAGNOSIS — E7800 Pure hypercholesterolemia, unspecified: Secondary | ICD-10-CM

## 2012-06-13 DIAGNOSIS — E1165 Type 2 diabetes mellitus with hyperglycemia: Secondary | ICD-10-CM

## 2012-06-13 DIAGNOSIS — N181 Chronic kidney disease, stage 1: Secondary | ICD-10-CM

## 2012-06-13 LAB — BASIC METABOLIC PANEL
BUN: 42 mg/dL — ABNORMAL HIGH (ref 6–23)
CO2: 25 mEq/L (ref 19–32)
Chloride: 107 mEq/L (ref 96–112)
Creat: 2.78 mg/dL — ABNORMAL HIGH (ref 0.50–1.35)

## 2012-06-13 LAB — GLUCOSE, CAPILLARY
Comment 1: 2
Glucose-Capillary: 123 mg/dL — ABNORMAL HIGH (ref 70–99)

## 2012-06-13 LAB — LIPID PANEL: LDL Cholesterol: 60 mg/dL (ref 0–99)

## 2012-06-13 MED ORDER — ATORVASTATIN CALCIUM 40 MG PO TABS: 40.0000 mg | ORAL_TABLET | Freq: Every day | ORAL | Status: DC

## 2012-06-13 NOTE — Assessment & Plan Note (Signed)
Better control since he is using the Lantus as prescribed.  Wide fluctuations in CBG readings at home (depends on diet)>  Will set him up with our Health Coach Lawrence [name].   He is awaiting decision on Medicaid application.  In the meantime, his social worker [name]-- unsure of how she was assigned to Lawrence [name]) has given him [employer] medication assistance forms to be completed.  I would prefer that he reapply for the MAP program at [facility] Department, as individual completion of these forms (and managing the receipt and distribution of the meds) is beyond the scope of the [facility].  I will route to our CSW, Lawrence [name], for any additional thoughts.

## 2012-06-13 NOTE — Patient Instructions (Addendum)
It was a pleasure to see you today.  We are checking your kidney function and cholesterol today.  Stop the glipizide and metformin.  We will check with our social worker regarding your eligibility for the medication assist program (MAP) at [facility].  I would like you to see Arlys John, our Health Coach.   Keep appointment with kidney doctors on May 20th, Ortho May 9th.  I would like to see you back in 2 months.

## 2012-06-13 NOTE — Assessment & Plan Note (Signed)
I am rechecking his BMet today; he has Nephrology follow up on May 20th.  Recommended to have weekly BMets done here until then.

## 2012-06-13 NOTE — Progress Notes (Signed)
  Subjective:    Patient ID: Lawrence [name], male    DOB: 1960/07/11, 52 y.o.   MRN: [id number]  HPI  Lawrence [name] comes in today for followup, accompanied by his sister.  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  Had been with considerable pain last week, which improved in the past 2 days.  He has follow up with his orthopedic surgeon this Friday (May 9th).  Has chronic renal failure with GFR<15 during recent admission.  Is following with Nephrology as outpatient on May 20th.  CRF thought to be secondary to poor DM control. Regarding DM control, his A1C at admission was markedly better than previously (now in mid-8s).  He ascribes the improvement to better adherence to meds and Lantus.  Brings in the pharmacy assistance program application for Lantus and asks for this to be filled out.  He states he had been to the MAP program at [facility] Dept "awhile ago" (their function is to streamline this process for indigent patients).  I asked that he contact them again to see if he qualifies for them to process his application.  In the meantime, he has sent in his application for Medicaid and says it may be up to 3 months until he receives a decision about whether he will be granted Medicaid.  DM: checks CBGs in the evenings, at bedtime.  Has had CBGs as low as 38-50, which makes him symptomatic.  Often is closer to 300; he notes this variation is largely dependent on his diet during the day.  He is no longer taking the Metformin since the hospitalization, but is using the glipizide daily.   Review of Systems  No fevers or chills, no cough, no chest pain.       Objective:   Physical Exam Well appearing, no apparent distress, In wheelchair.  HEENT Neck supple. No cervical adenopathy COR Regular S1S2, no extra sounds PULM Clear bilaterally, no rales or wheezes LEs: R foot wrapped, with wound vac in place.         Assessment &  Plan:

## 2012-06-14 ENCOUNTER — Encounter: Payer: Self-pay | Admitting: Family Medicine

## 2012-06-20 NOTE — Discharge Summary (Signed)
Physician Discharge Summary  Patient ID: Lawrence [name] MRN: [id number] DOB/AGE: 1961-01-03 52 y.o.  Admit date: 05/29/2012 Discharge date: 06/06/2012  Admission Diagnoses:  ARF (acute renal failure) Right first ray amputation wound dehiscence.  Discharge Diagnoses:  Principal Problem:   ARF (acute renal failure) Active Problems:   Post-operative wound abscess   Cellulitis right first ray amputation wound dehiscence.  Past Medical History  Diagnosis Date  . Hypertension   . Chronic kidney disease   . Hypercholesteremia   . Diabetes mellitus     insulin dependent    Surgeries: Procedure(s): IRRIGATION AND DEBRIDEMENT EXTREMITY- right foot with wound VAC placement on 05/29/2012   Consultants (if any): Treatment Team:  Trevor Iha, MD  Infectious Disease Service Dr Sung Amabile for central line placement.  Discharged Condition: Improved  Hospital Course: Lawrence [name] is an 52 y.o. male who was admitted 05/29/2012 with a diagnosis of ARF (acute renal failure) and went to the operating room on 05/29/2012 and underwent the above named procedures.    He was given perioperative antibiotics:  Anti-infectives   Start     Dose/Rate Route Frequency Ordered Stop   06/05/12 0000  metroNIDAZOLE (FLAGYL) 500 MG tablet     500 mg Oral Every 8 hours 06/05/12 1022     06/05/12 0000  levofloxacin (LEVAQUIN) 500 MG tablet     500 mg Oral Every 48 hours 06/05/12 1022     06/03/12 1400  levofloxacin (LEVAQUIN) tablet 500 mg  Status:  Discontinued     500 mg Oral Every 48 hours 06/02/12 1720 06/06/12 2035   06/02/12 1400  cefTRIAXone (ROCEPHIN) 2 g in dextrose 5 % 50 mL IVPB  Status:  Discontinued     2 g 100 mL/hr over 30 Minutes Intravenous Every 24 hours 06/02/12 1132 06/02/12 1720   06/01/12 1400  ceftaroline (TEFLARO) 300 mg in sodium chloride 0.9 % 250 mL IVPB  Status:  Discontinued     300 mg 250 mL/hr over 60 Minutes Intravenous Every 12 hours 06/01/12 0949 06/02/12 1132   05/31/12 1400  ceftaroline (TEFLARO) 400 mg in sodium chloride 0.9 % 250 mL IVPB  Status:  Discontinued     400 mg 250 mL/hr over 60 Minutes Intravenous Every 12 hours 05/31/12 1252 06/01/12 0949   05/31/12 1400  metroNIDAZOLE (FLAGYL) tablet 500 mg  Status:  Discontinued     500 mg Oral 3 times per day 05/31/12 1252 06/06/12 2035   05/30/12 0600  piperacillin-tazobactam (ZOSYN) IVPB 3.375 g  Status:  Discontinued     3.375 g 12.5 mL/hr over 240 Minutes Intravenous Every 8 hours 05/30/12 0232 05/31/12 1252   05/29/12 1800  vancomycin (VANCOCIN) 1,750 mg in sodium chloride 0.9 % 500 mL IVPB  Status:  Discontinued     1,750 mg 250 mL/hr over 120 Minutes Intravenous Every 24 hours 05/29/12 1611 05/31/12 0921   05/29/12 1600  piperacillin-tazobactam (ZOSYN) IVPB 3.375 g  Status:  Discontinued     3.375 g 100 mL/hr over 30 Minutes Intravenous 3 times per day 05/29/12 1545 05/30/12 0232   05/29/12 1044  ceFAZolin (ANCEF) 2-3 GM-% IVPB SOLR    Comments:  KENSMOE, KATHERINE: cabinet override      05/29/12 1044 05/29/12 1245   05/29/12 1038  ceFAZolin (ANCEF) IVPB 2 g/50 mL premix  Status:  Discontinued     2 g 100 mL/hr over 30 Minutes Intravenous On call to O.R. 05/29/12 1038 05/29/12 1517    Pt initially treated with  IV vancomycin.  PICC line placed.  Creatine elevated >4.0 and remained elevated for several days.  Vancomycin discontinued and renal consult obtained.  PICC line discontinued to avoid damage to veins for need of possible dialysis. Central line placed. Final cultures with E.coli growth and pt started on oral abx per ID recommendation. Pt started on Levaquin 500mg  every other day and Flagyl 500mg  tid for 4 weeks.  Pt noted to have hypoglycemic episodes and diabetic treatment changes made.  HGA1C was 8.6 and he remained on a low carb diet.  VAC was changed with the assistance of the wound care nurse and arrangements made for home VAC use and changes. Pt was eventually ready for discharge  once his creatine began trending downward and he was cleared from the renal service. He was given sequential compression devices, early ambulation for DVT prophylaxis.  He benefited maximally from the hospital stay and there were no complications.    Recent vital signs:  Filed Vitals:   06/06/12 1349  BP: 145/68  Pulse: 76  Temp: 98.2 F (36.8 C)  Resp: 18    Recent laboratory studies:  Lab Results  Component Value Date   HGB 9.0* 06/05/2012   HGB 8.8* 06/04/2012   HGB 8.5* 06/03/2012   Lab Results  Component Value Date   WBC 5.0 06/05/2012   PLT 203 06/05/2012   No results found for this basename: INR   Lab Results  Component Value Date   NA 145 06/13/2012   K 3.8 06/13/2012   CL 107 06/13/2012   CO2 25 06/13/2012   BUN 42* 06/13/2012   CREATININE 2.78* 06/13/2012   GLUCOSE 131* 06/13/2012    Discharge Medications:     Medication List    STOP taking these medications       doxycycline 100 MG tablet  Commonly known as:  VIBRA-TABS     glipiZIDE 10 MG 24 hr tablet  Commonly known as:  GLUCOTROL XL     lisinopril-hydrochlorothiazide 20-12.5 MG per tablet  Commonly known as:  PRINZIDE,ZESTORETIC     metFORMIN 850 MG tablet  Commonly known as:  GLUCOPHAGE     omeprazole 20 MG capsule  Commonly known as:  PRILOSEC      TAKE these medications       amLODipine 5 MG tablet  Commonly known as:  NORVASC  Take 1 tablet (5 mg total) by mouth at bedtime.     aspirin 81 MG tablet  Take 81 mg by mouth every morning.     HYDROcodone-acetaminophen 5-325 MG per tablet  Commonly known as:  NORCO/VICODIN  Take 1-2 tablets by mouth every 4 (four) hours as needed. For pain     HYDROcodone-acetaminophen 5-325 MG per tablet  Commonly known as:  NORCO/VICODIN  Take 1-2 tablets by mouth every 4 (four) hours as needed.     insulin glargine 100 UNIT/ML injection  Commonly known as:  LANTUS  Inject 20 Units into the skin at bedtime.     levofloxacin 500 MG tablet  Commonly known  as:  LEVAQUIN  Take 1 tablet (500 mg total) by mouth every other day.     metoprolol tartrate 25 MG tablet  Commonly known as:  LOPRESSOR  Take 1 tablet (25 mg total) by mouth 2 (two) times daily.     metroNIDAZOLE 500 MG tablet  Commonly known as:  FLAGYL  Take 1 tablet (500 mg total) by mouth every 8 (eight) hours.        Diagnostic  Studies: US Renal  06/01/2012  *RADIOLOGY REPORT*  Clinical Data: Acute on chronic renal failure.  RENAL/URINARY TRACT ULTRASOUND COMPLETE  Comparison:  CT dated 03/29/2011  Findings:  Right Kidney:  The right kidney measures approximately 13.8 cm in length. There is suggestion of mild cortical atrophy and mildly increased cortical echogenicity.  No hydronephrosis or focal lesion is identified.  Left Kidney:  The left kidney measures 14 cm in length.  No atrophy, hydronephrosis, abnormal echogenicity or focal lesion is identified.  Bladder:  Bladder is moderately distended at the time of imaging and unremarkable in appearance.  IMPRESSION: No evidence of hydronephrosis.  Equal sized kidneys with the right demonstrating suggestion of mild cortical atrophy and increased cortical echogenicity.   Original Report Authenticated By: Irish Lack, M.D.    Mr Foot Right W Wo Contrast  05/26/2012  *RADIOLOGY REPORT*  Clinical Data: Right forefoot pain and redness, swelling and drainage.  Status post amputation of the great toe 2 weeks ago.  MRI OF THE RIGHT FOREFOOT WITHOUT AND WITH CONTRAST  Technique:  Multiplanar, multisequence MR imaging was performed both before and after administration of intravenous contrast.  Contrast: 18mL MULTIHANCE GADOBENATE DIMEGLUMINE 529 MG/ML IV SOLN  Comparison: Plain films 04/18/2012.  Findings: The patient is status post amputation at the level of the mid first metatarsal.  There is marrow edema and enhancement in the remaining distal 2.0 cm of the first metatarsal.  Edema and enhancement are also seen in the distal 1.7 cm of the second  metatarsal and proximal 1.5 cm of the proximal phalanx of the second toe. Small subchondral cyst is seen in the middle cuneiform. Bone marrow signal is otherwise unremarkable.  There is extensive soft tissue edema diffusely about the foot.  A focal wound at the skin surface is seen at the amputation site. Deep to the wound, there is fluid worrisome for abscess measuring 1.8 cm transverse by 1.2 cm cranial-caudal by 1.1 cm long.  No other fluid collection is identified.  IMPRESSION:  1.  Findings consistent with osteomyelitis in the distal 1.7 cm of the second metatarsal and proximal 1.5 cm of the proximal phalanx of the second toe. 2.  Status post amputation of the first ray at the level of the mid first metatarsal.  Edema and enhancement in the distal 2 cm of the remaining first metatarsal could be postoperative but is highly worrisome for osteomyelitis.  Skin wound is seen at the amputation site with underlying fluid worrisome for abscess.   Original Report Authenticated By: Holley Dexter, M.D.     Disposition: 01-Home or Self Care      Discharge Orders   Future Appointments Provider Department Dept Phone   06/21/2012 3:45 PM Randall Hiss, MD [facility] for Infectious Disease [phone]   Future Orders Complete By Expires     Call MD / Call 911  As directed     Comments:      If you experience chest pain or shortness of breath, CALL 911 and be transported to the hospital emergency room.  If you develope a fever above 101 F, pus (white drainage) or increased drainage or redness at the wound, or calf pain, call your surgeon's office.    Constipation Prevention  As directed     Comments:      Drink plenty of fluids.  Prune juice may be helpful.  You may use a stool softener, such as Colace (over the counter) 100 mg twice a day.  Use MiraLax (over the counter) for constipation as needed.    Diet - low sodium heart healthy  As directed     Discharge instructions  As directed      Comments:      Take antibiotics as directed.  Stop lisinopril/HCTZ.  Keep foot elevated when at rest.  Walk with walker and Darko shoe.  Keep VAC dressing dry and clean.    Increase activity slowly as tolerated  As directed        Follow-up Information   Follow up with YATES,Shayan C, MD. Schedule an appointment as soon as possible for a visit in 1 week.   Contact information:   [address] [place] [address] [phone]       Follow up with [name], MD. Schedule an appointment as soon as possible for a visit in 1 week.   Contact information:   [address] [place] [address] [phone]        Signed: Wende Neighbors 06/20/2012, 3:18 PM

## 2012-06-21 ENCOUNTER — Inpatient Hospital Stay: Payer: Self-pay | Admitting: Infectious Disease

## 2012-06-29 ENCOUNTER — Telehealth: Payer: Self-pay | Admitting: Home Health Services

## 2012-06-29 NOTE — Telephone Encounter (Signed)
Spoke with Lawrence [name] Pt reports feeling: fair Pt reports taking medications yes Patient missed taking medications 0 days this week.   Pt is self monitoring their DM:yes medication compliance: compliant most of the time, home glucose monitoring: is performed regularly, fasting values range 135-200  Pt expressed concern about possibly having his foot amputated. Pt will know in 2 weeks what will happen.  Pt reports not having an appetite and not sleeping well.   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

## 2012-08-08 ENCOUNTER — Other Ambulatory Visit (HOSPITAL_COMMUNITY): Payer: Self-pay | Admitting: Orthopaedic Surgery

## 2012-08-08 DIAGNOSIS — F32A Depression, unspecified: Secondary | ICD-10-CM

## 2012-08-08 HISTORY — DX: Depression, unspecified: F32.A

## 2012-08-09 NOTE — H&P (Signed)
Lawrence [name] is an 52 y.o. male.   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  Vascular studies have shown significant small vessel disease of the right foot and also of the distal renal arteries.  He now continues to have poor healing despite VAC treatment for over 2 months.  He is now admitted for right below knee amputation.  Past Medical History  Diagnosis Date  . Hypertension   . Chronic kidney disease   . Hypercholesteremia   . Diabetes mellitus     insulin dependent    Past Surgical History  Procedure Laterality Date  . Amputation Right 05/03/2012    Procedure: Right First Ray AMPUTATION;  Surgeon: Eldred Manges, MD;  Location: [facility] OR;  Service: Orthopedics;  Laterality: Right;  . I&d extremity Right 05/29/2012    Procedure: IRRIGATION AND DEBRIDEMENT EXTREMITY- right foot;  Surgeon: Eldred Manges, MD;  Location: [facility] OR;  Service: Orthopedics;  Laterality: Right;  Right Foot Debridement, VAC Application  . Toe amputation Right 05/29/2012    1ST  ray      Dr Ophelia Charter    No family history on file. Social History:  reports that he has never smoked. He has never used smokeless tobacco. He reports that he does not drink alcohol or use illicit drugs.  Allergies: No Known Allergies  No prescriptions prior to admission    No results found for this or any previous visit (from the past 48 hour(s)). No results found.  Review of Systems  Constitutional: Positive for malaise/fatigue.  HENT: Negative.   Eyes: Negative.   Respiratory: Negative.   Gastrointestinal: Negative.   Musculoskeletal: Positive for myalgias.  Skin: Negative.   Neurological: Negative.   Endo/Heme/Allergies:  Negative.   Psychiatric/Behavioral: Positive for depression.    There were no vitals taken for this visit. Physical Exam  Constitutional: He is oriented to person, place, and time. He appears well-developed and well-nourished.  HENT:  Head: Normocephalic and atraumatic.  Eyes: Pupils are equal, round, and reactive to light.  Neck: Normal range of motion.  Cardiovascular: Normal rate.   Respiratory: Effort normal.  GI: Soft.  Musculoskeletal:  Debridement site of right foot with necrotic tissue along the skin edges.  Exudate at wound site.  Erythema about the dorsum of the right foot.    Neurological: He is alert and oriented to person, place, and time.  Skin: Skin is dry.  Psychiatric: He has a normal mood and affect.     Assessment/Plan  Non healing post op wound of right foot secondary to uncontrolled DM and peripheral artery disease. Osteomyelitis of the right foot.  PLAN:  Right BKA  Natayah Warmack M 08/09/2012, 4:30 PM

## 2012-08-14 ENCOUNTER — Encounter (HOSPITAL_COMMUNITY): Payer: Self-pay | Admitting: Respiratory Therapy

## 2012-08-15 ENCOUNTER — Encounter (HOSPITAL_COMMUNITY): Payer: Self-pay

## 2012-08-15 ENCOUNTER — Encounter (HOSPITAL_COMMUNITY)
Admission: RE | Admit: 2012-08-15 | Discharge: 2012-08-15 | Disposition: A | Discharge status: Home / Self Care | Payer: Self-pay | Source: Ambulatory Visit | Attending: Orthopaedic Surgery | Admitting: Orthopaedic Surgery

## 2012-08-15 DIAGNOSIS — Z01812 Encounter for preprocedural laboratory examination: Secondary | ICD-10-CM | POA: Insufficient documentation

## 2012-08-15 HISTORY — DX: Major depressive disorder, single episode, unspecified: F32.9

## 2012-08-15 LAB — PROTIME-INR
INR: 0.94 (ref 0.00–1.49)
Prothrombin Time: 12.4 seconds (ref 11.6–15.2)

## 2012-08-15 LAB — COMPREHENSIVE METABOLIC PANEL
AST: 16 U/L (ref 0–37)
BUN: 30 mg/dL — ABNORMAL HIGH (ref 6–23)
CO2: 27 mEq/L (ref 19–32)
Chloride: 103 mEq/L (ref 96–112)
Creatinine, Ser: 1.19 mg/dL (ref 0.50–1.35)
GFR calc non Af Amer: 69 mL/min — ABNORMAL LOW (ref >90)
Total Bilirubin: 0.3 mg/dL (ref 0.3–1.2)

## 2012-08-15 LAB — CBC
HCT: 33.7 % — ABNORMAL LOW (ref 39.0–52.0)
MCV: 77.3 fL — ABNORMAL LOW (ref 78.0–100.0)
RBC: 4.36 MIL/uL (ref 4.22–5.81)
WBC: 6.4 10*3/uL (ref 4.0–10.5)

## 2012-08-15 MED ORDER — DEXTROSE 5 % IV SOLN
2.0000 g | INTRAVENOUS | Status: AC
  Administered 2012-08-16: 2 g via INTRAVENOUS

## 2012-08-15 NOTE — Pre-Procedure Instructions (Signed)
Lawrence [name]  08/15/2012   Your procedure is scheduled on:  08-16-2012   @ 12:30 PM  Come in thru Main Entrance,take the [place] Elevators to the 3rd floor and check in at the [employer].   Report to [facility] at 10:30 AM.  Call this number if you have problems the morning of surgery: [phone]    Remember:   Do not eat food or drink liquids after midnight.   Take these medicines the morning of surgery with A SIP OF WATER:  Doxycycline,(Vibra-Tabs), pain medication as needed,metoprolol(Lopressor)              No Insulin the morning of surgery   Do not wear jewelry  Do not wear lotions, powders, or perfumes.   Do not shave 48 hours prior to surgery. Men may shave face and neck.  Do not bring valuables to the hospital.  [facility] is not responsible for any belongings or valuables.  Contacts, dentures or bridgework may not be worn into surgery.  Leave suitcase in the car. After surgery it may be brought to your room.   For patients admitted to the hospital, checkout time is 11:00 AM the day of discharge.       Special Instructions: Shower using CHG the night before and the morning of surgery   Please read over the following fact sheets that you were given: Pain Booklet, Coughing and Deep Breathing and Surgical Site Infection Prevention

## 2012-08-16 ENCOUNTER — Inpatient Hospital Stay (HOSPITAL_COMMUNITY)
Admission: RE | Admit: 2012-08-16 | Discharge: 2012-08-21 | DRG: 240 | Disposition: A | Discharge status: Rehabilitation Facility | Payer: Medicaid Other | Source: Ambulatory Visit | Attending: Orthopaedic Surgery | Admitting: Orthopaedic Surgery

## 2012-08-16 ENCOUNTER — Encounter (HOSPITAL_COMMUNITY): Payer: Self-pay | Admitting: Anesthesiology

## 2012-08-16 ENCOUNTER — Inpatient Hospital Stay (HOSPITAL_COMMUNITY): Payer: Medicaid Other | Admitting: Anesthesiology

## 2012-08-16 ENCOUNTER — Encounter (HOSPITAL_COMMUNITY)
Admission: RE | Disposition: A | Discharge status: Rehabilitation Facility | Payer: Self-pay | Source: Ambulatory Visit | Attending: Orthopaedic Surgery

## 2012-08-16 ENCOUNTER — Encounter (HOSPITAL_COMMUNITY): Payer: Self-pay | Admitting: *Deleted

## 2012-08-16 DIAGNOSIS — Z794 Long term (current) use of insulin: Secondary | ICD-10-CM

## 2012-08-16 DIAGNOSIS — N189 Chronic kidney disease, unspecified: Secondary | ICD-10-CM | POA: Diagnosis present

## 2012-08-16 DIAGNOSIS — R339 Retention of urine, unspecified: Secondary | ICD-10-CM | POA: Diagnosis not present

## 2012-08-16 DIAGNOSIS — E7800 Pure hypercholesterolemia, unspecified: Secondary | ICD-10-CM | POA: Diagnosis present

## 2012-08-16 DIAGNOSIS — M869 Osteomyelitis, unspecified: Secondary | ICD-10-CM

## 2012-08-16 DIAGNOSIS — F411 Generalized anxiety disorder: Secondary | ICD-10-CM | POA: Diagnosis not present

## 2012-08-16 DIAGNOSIS — M9080 Osteopathy in diseases classified elsewhere, unspecified site: Secondary | ICD-10-CM | POA: Diagnosis present

## 2012-08-16 DIAGNOSIS — IMO0002 Reserved for concepts with insufficient information to code with codable children: Secondary | ICD-10-CM | POA: Diagnosis present

## 2012-08-16 DIAGNOSIS — E1169 Type 2 diabetes mellitus with other specified complication: Secondary | ICD-10-CM | POA: Diagnosis present

## 2012-08-16 DIAGNOSIS — I739 Peripheral vascular disease, unspecified: Principal | ICD-10-CM | POA: Diagnosis present

## 2012-08-16 DIAGNOSIS — L97509 Non-pressure chronic ulcer of other part of unspecified foot with unspecified severity: Secondary | ICD-10-CM | POA: Diagnosis present

## 2012-08-16 DIAGNOSIS — M86679 Other chronic osteomyelitis, unspecified ankle and foot: Secondary | ICD-10-CM | POA: Diagnosis present

## 2012-08-16 DIAGNOSIS — F3289 Other specified depressive episodes: Secondary | ICD-10-CM | POA: Diagnosis present

## 2012-08-16 DIAGNOSIS — L98499 Non-pressure chronic ulcer of skin of other sites with unspecified severity: Principal | ICD-10-CM | POA: Diagnosis present

## 2012-08-16 DIAGNOSIS — R5381 Other malaise: Secondary | ICD-10-CM | POA: Diagnosis not present

## 2012-08-16 DIAGNOSIS — F329 Major depressive disorder, single episode, unspecified: Secondary | ICD-10-CM | POA: Diagnosis present

## 2012-08-16 DIAGNOSIS — I129 Hypertensive chronic kidney disease with stage 1 through stage 4 chronic kidney disease, or unspecified chronic kidney disease: Secondary | ICD-10-CM | POA: Diagnosis present

## 2012-08-16 DIAGNOSIS — S98119A Complete traumatic amputation of unspecified great toe, initial encounter: Secondary | ICD-10-CM

## 2012-08-16 HISTORY — DX: Other specified postprocedural states: R11.2

## 2012-08-16 HISTORY — PX: AMPUTATION: SHX166

## 2012-08-16 HISTORY — DX: Other specified postprocedural states: Z98.890

## 2012-08-16 LAB — GLUCOSE, CAPILLARY: Glucose-Capillary: 148 mg/dL — ABNORMAL HIGH (ref 70–99)

## 2012-08-16 LAB — CBC
Hemoglobin: 10.7 g/dL — ABNORMAL LOW (ref 13.0–17.0)
MCH: 26.4 pg (ref 26.0–34.0)
MCHC: 34.1 g/dL (ref 30.0–36.0)
RDW: 15.5 % (ref 11.5–15.5)

## 2012-08-16 LAB — CREATININE, SERUM: Creatinine, Ser: 1.2 mg/dL (ref 0.50–1.35)

## 2012-08-16 SURGERY — AMPUTATION BELOW KNEE
Anesthesia: General | Site: Leg Lower | Laterality: Right | Wound class: Clean

## 2012-08-16 MED ORDER — LABETALOL HCL 5 MG/ML IV SOLN
INTRAVENOUS | Status: AC
  Filled 2012-08-16: qty 4

## 2012-08-16 MED ORDER — METHOCARBAMOL 500 MG PO TABS
ORAL_TABLET | ORAL | Status: AC
  Filled 2012-08-16: qty 1

## 2012-08-16 MED ORDER — MIDAZOLAM HCL 2 MG/2ML IJ SOLN
INTRAMUSCULAR | Status: AC
  Administered 2012-08-16: 2 mg
  Filled 2012-08-16: qty 2

## 2012-08-16 MED ORDER — LIDOCAINE HCL (CARDIAC) 20 MG/ML IV SOLN
INTRAVENOUS | Status: DC | PRN
  Administered 2012-08-16: 80 mg via INTRAVENOUS

## 2012-08-16 MED ORDER — SCOPOLAMINE 1 MG/3DAYS TD PT72
MEDICATED_PATCH | TRANSDERMAL | Status: AC
  Administered 2012-08-16: 1 via TRANSDERMAL
  Filled 2012-08-16: qty 1

## 2012-08-16 MED ORDER — SODIUM CHLORIDE 0.9 % IV SOLN
INTRAVENOUS | Status: DC
  Administered 2012-08-17 – 2012-08-20 (×5): via INTRAVENOUS

## 2012-08-16 MED ORDER — ONDANSETRON HCL 4 MG/2ML IJ SOLN: 4.0000 mg | Freq: Four times a day (QID) | INTRAMUSCULAR | Status: DC | PRN

## 2012-08-16 MED ORDER — ENOXAPARIN SODIUM 30 MG/0.3ML ~~LOC~~ SOLN
30.0000 mg | SUBCUTANEOUS | Status: DC
  Administered 2012-08-17 – 2012-08-21 (×5): 30 mg via SUBCUTANEOUS
  Filled 2012-08-16 (×6): qty 0.3

## 2012-08-16 MED ORDER — ONDANSETRON HCL 4 MG PO TABS: 4.0000 mg | ORAL_TABLET | Freq: Four times a day (QID) | ORAL | Status: DC | PRN

## 2012-08-16 MED ORDER — ACETAMINOPHEN 10 MG/ML IV SOLN
1000.0000 mg | Freq: Once | INTRAVENOUS | Status: AC | PRN
  Administered 2012-08-16: 1000 mg via INTRAVENOUS

## 2012-08-16 MED ORDER — OXYCODONE-ACETAMINOPHEN 5-325 MG PO TABS
ORAL_TABLET | ORAL | Status: AC
  Filled 2012-08-16: qty 2

## 2012-08-16 MED ORDER — 0.9 % SODIUM CHLORIDE (POUR BTL) OPTIME
TOPICAL | Status: DC | PRN
  Administered 2012-08-16: 1000 mL

## 2012-08-16 MED ORDER — HYDROMORPHONE HCL PF 1 MG/ML IJ SOLN
INTRAMUSCULAR | Status: AC
  Filled 2012-08-16: qty 1

## 2012-08-16 MED ORDER — ACETAMINOPHEN 10 MG/ML IV SOLN
INTRAVENOUS | Status: AC
  Filled 2012-08-16: qty 100

## 2012-08-16 MED ORDER — FENTANYL CITRATE 0.05 MG/ML IJ SOLN
INTRAMUSCULAR | Status: DC | PRN
  Administered 2012-08-16: 25 ug via INTRAVENOUS
  Administered 2012-08-16 (×2): 50 ug via INTRAVENOUS

## 2012-08-16 MED ORDER — METOCLOPRAMIDE HCL 5 MG/ML IJ SOLN: 5.0000 mg | Freq: Three times a day (TID) | INTRAMUSCULAR | Status: DC | PRN

## 2012-08-16 MED ORDER — HYDROCODONE-ACETAMINOPHEN 5-325 MG PO TABS
1.0000 | ORAL_TABLET | ORAL | Status: DC | PRN
  Administered 2012-08-16 – 2012-08-18 (×3): 2 via ORAL
  Filled 2012-08-16 (×3): qty 2

## 2012-08-16 MED ORDER — MIDAZOLAM HCL 5 MG/5ML IJ SOLN
INTRAMUSCULAR | Status: DC | PRN
  Administered 2012-08-16: 2 mg via INTRAVENOUS

## 2012-08-16 MED ORDER — ONDANSETRON HCL 4 MG/2ML IJ SOLN
INTRAMUSCULAR | Status: DC | PRN
  Administered 2012-08-16: 4 mg via INTRAVENOUS

## 2012-08-16 MED ORDER — ONDANSETRON HCL 4 MG/2ML IJ SOLN: 4.0000 mg | Freq: Once | INTRAMUSCULAR | Status: DC | PRN

## 2012-08-16 MED ORDER — ASPIRIN 81 MG PO TABS: 81.0000 mg | ORAL_TABLET | Freq: Every morning | ORAL | Status: DC

## 2012-08-16 MED ORDER — METHOCARBAMOL 100 MG/ML IJ SOLN
500.0000 mg | Freq: Four times a day (QID) | INTRAVENOUS | Status: DC | PRN
  Filled 2012-08-16: qty 5

## 2012-08-16 MED ORDER — METOPROLOL TARTRATE 25 MG PO TABS
25.0000 mg | ORAL_TABLET | Freq: Two times a day (BID) | ORAL | Status: DC
  Administered 2012-08-16 – 2012-08-21 (×10): 25 mg via ORAL
  Filled 2012-08-16 (×11): qty 1

## 2012-08-16 MED ORDER — PROPOFOL 10 MG/ML IV BOLUS
INTRAVENOUS | Status: DC | PRN
  Administered 2012-08-16: 130 mg via INTRAVENOUS

## 2012-08-16 MED ORDER — LACTATED RINGERS IV SOLN
INTRAVENOUS | Status: DC
  Administered 2012-08-16: 13:00:00 via INTRAVENOUS

## 2012-08-16 MED ORDER — DEXAMETHASONE SODIUM PHOSPHATE 10 MG/ML IJ SOLN
INTRAMUSCULAR | Status: DC | PRN
  Administered 2012-08-16: 4 mg via INTRAVENOUS

## 2012-08-16 MED ORDER — CEFAZOLIN SODIUM-DEXTROSE 2-3 GM-% IV SOLR
2.0000 g | Freq: Four times a day (QID) | INTRAVENOUS | Status: AC
  Administered 2012-08-16 – 2012-08-17 (×3): 2 g via INTRAVENOUS
  Filled 2012-08-16 (×3): qty 50

## 2012-08-16 MED ORDER — CEFAZOLIN SODIUM-DEXTROSE 2-3 GM-% IV SOLR
INTRAVENOUS | Status: AC
  Filled 2012-08-16: qty 50

## 2012-08-16 MED ORDER — METOCLOPRAMIDE HCL 10 MG PO TABS: 5.0000 mg | ORAL_TABLET | Freq: Three times a day (TID) | ORAL | Status: DC | PRN

## 2012-08-16 MED ORDER — HYDROMORPHONE HCL PF 1 MG/ML IJ SOLN
0.2500 mg | INTRAMUSCULAR | Status: DC | PRN
  Administered 2012-08-16 (×4): 0.5 mg via INTRAVENOUS

## 2012-08-16 MED ORDER — OXYCODONE-ACETAMINOPHEN 5-325 MG PO TABS
1.0000 | ORAL_TABLET | ORAL | Status: DC | PRN
  Administered 2012-08-16: 2 via ORAL
  Administered 2012-08-17: 1 via ORAL
  Administered 2012-08-17 – 2012-08-20 (×9): 2 via ORAL
  Filled 2012-08-16 (×2): qty 2
  Filled 2012-08-16: qty 1
  Filled 2012-08-16 (×7): qty 2

## 2012-08-16 MED ORDER — LABETALOL HCL 5 MG/ML IV SOLN
5.0000 mg | INTRAVENOUS | Status: DC | PRN
  Administered 2012-08-16 (×2): 5 mg via INTRAVENOUS

## 2012-08-16 MED ORDER — AMITRIPTYLINE HCL 25 MG PO TABS
25.0000 mg | ORAL_TABLET | Freq: Every day | ORAL | Status: DC
  Administered 2012-08-16 – 2012-08-19 (×4): 25 mg via ORAL
  Filled 2012-08-16 (×5): qty 1

## 2012-08-16 MED ORDER — METHOCARBAMOL 500 MG PO TABS
500.0000 mg | ORAL_TABLET | Freq: Four times a day (QID) | ORAL | Status: DC | PRN
  Administered 2012-08-16 – 2012-08-20 (×10): 500 mg via ORAL
  Filled 2012-08-16 (×9): qty 1

## 2012-08-16 MED ORDER — LACTATED RINGERS IV SOLN
INTRAVENOUS | Status: DC | PRN
  Administered 2012-08-16 (×2): via INTRAVENOUS

## 2012-08-16 MED ORDER — INSULIN GLARGINE 100 UNIT/ML ~~LOC~~ SOLN
20.0000 [IU] | Freq: Every day | SUBCUTANEOUS | Status: DC
  Administered 2012-08-16 – 2012-08-20 (×5): 20 [IU] via SUBCUTANEOUS
  Filled 2012-08-16 (×6): qty 0.2

## 2012-08-16 MED ORDER — HYDROMORPHONE HCL PF 1 MG/ML IJ SOLN
0.5000 mg | INTRAMUSCULAR | Status: DC | PRN
  Administered 2012-08-17 – 2012-08-19 (×2): 1 mg via INTRAVENOUS
  Filled 2012-08-16 (×2): qty 1

## 2012-08-16 MED ORDER — ASPIRIN 81 MG PO CHEW
81.0000 mg | CHEWABLE_TABLET | Freq: Every day | ORAL | Status: DC
  Administered 2012-08-17 – 2012-08-21 (×5): 81 mg via ORAL
  Filled 2012-08-16 (×5): qty 1

## 2012-08-16 SURGICAL SUPPLY — 53 items
BANDAGE ELASTIC 4 VELCRO ST LF (GAUZE/BANDAGES/DRESSINGS) ×2 IMPLANT
BANDAGE ELASTIC 6 VELCRO ST LF (GAUZE/BANDAGES/DRESSINGS) ×2 IMPLANT
BANDAGE ESMARK 6X9 LF (GAUZE/BANDAGES/DRESSINGS) IMPLANT
BANDAGE GAUZE ELAST BULKY 4 IN (GAUZE/BANDAGES/DRESSINGS) ×2 IMPLANT
BLADE SAW SAG 29X58X.64 (BLADE) ×2 IMPLANT
BNDG CMPR 9X6 STRL LF SNTH (GAUZE/BANDAGES/DRESSINGS)
BNDG COHESIVE 6X5 TAN STRL LF (GAUZE/BANDAGES/DRESSINGS) ×2 IMPLANT
BNDG ESMARK 6X9 LF (GAUZE/BANDAGES/DRESSINGS)
CLEANER TIP ELECTROSURG 2X2 (MISCELLANEOUS) ×2 IMPLANT
CLOTH BEACON ORANGE TIMEOUT ST (SAFETY) ×2 IMPLANT
COVER SURGICAL LIGHT HANDLE (MISCELLANEOUS) ×2 IMPLANT
CUFF TOURNIQUET SINGLE 34IN LL (TOURNIQUET CUFF) IMPLANT
CUFF TOURNIQUET SINGLE 44IN (TOURNIQUET CUFF) IMPLANT
DRAIN PENROSE 1/2X12 LTX STRL (WOUND CARE) IMPLANT
DRAPE EXTREMITY T 121X128X90 (DRAPE) ×2 IMPLANT
DRAPE INCISE IOBAN 66X45 STRL (DRAPES) ×2 IMPLANT
DRAPE PROXIMA HALF (DRAPES) ×4 IMPLANT
DRAPE U-SHAPE 47X51 STRL (DRAPES) ×2 IMPLANT
DRSG PAD ABDOMINAL 8X10 ST (GAUZE/BANDAGES/DRESSINGS) ×2 IMPLANT
DURAPREP 26ML APPLICATOR (WOUND CARE) ×2 IMPLANT
EVACUATOR 1/8 PVC DRAIN (DRAIN) IMPLANT
GAUZE XEROFORM 5X9 LF (GAUZE/BANDAGES/DRESSINGS) ×2 IMPLANT
GLOVE BIOGEL PI IND STRL 7.5 (GLOVE) ×1 IMPLANT
GLOVE BIOGEL PI IND STRL 8 (GLOVE) ×1 IMPLANT
GLOVE BIOGEL PI INDICATOR 7.5 (GLOVE) ×1
GLOVE BIOGEL PI INDICATOR 8 (GLOVE) ×1
GLOVE ECLIPSE 7.0 STRL STRAW (GLOVE) ×2 IMPLANT
GLOVE ORTHO TXT STRL SZ7.5 (GLOVE) ×2 IMPLANT
GOWN PREVENTION PLUS LG XLONG (DISPOSABLE) IMPLANT
GOWN PREVENTION PLUS XLARGE (GOWN DISPOSABLE) ×2 IMPLANT
GOWN STRL NON-REIN LRG LVL3 (GOWN DISPOSABLE) ×2 IMPLANT
KIT BASIN OR (CUSTOM PROCEDURE TRAY) ×2 IMPLANT
KIT ROOM TURNOVER OR (KITS) ×2 IMPLANT
MANIFOLD NEPTUNE II (INSTRUMENTS) ×2 IMPLANT
NEEDLE MAYO TROCAR (NEEDLE) ×2 IMPLANT
NS IRRIG 1000ML POUR BTL (IV SOLUTION) ×2 IMPLANT
PACK GENERAL/GYN (CUSTOM PROCEDURE TRAY) ×2 IMPLANT
PAD ARMBOARD 7.5X6 YLW CONV (MISCELLANEOUS) ×4 IMPLANT
PADDING CAST COTTON 6X4 STRL (CAST SUPPLIES) ×2 IMPLANT
SPONGE GAUZE 4X4 12PLY (GAUZE/BANDAGES/DRESSINGS) ×2 IMPLANT
SPONGE LAP 18X18 X RAY DECT (DISPOSABLE) IMPLANT
STAPLER VISISTAT 35W (STAPLE) ×2 IMPLANT
STOCKINETTE IMPERVIOUS LG (DRAPES) IMPLANT
STOCKINETTE SYNTHETIC 3 UNSTER (CAST SUPPLIES) ×2 IMPLANT
STOCKINETTE SYNTHETIC 6 UNSTER (CAST SUPPLIES) ×2 IMPLANT
SUT SILK 3 0 SH CR/8 (SUTURE) ×2 IMPLANT
SUT VIC AB 0 CT1 27 (SUTURE) ×2
SUT VIC AB 0 CT1 27XBRD ANBCTR (SUTURE) ×1 IMPLANT
SUT VICRYL 0 TIES 12 18 (SUTURE) ×2 IMPLANT
SUT VICRYL AB 2 0 TIES (SUTURE) ×2 IMPLANT
TOWEL OR 17X24 6PK STRL BLUE (TOWEL DISPOSABLE) ×2 IMPLANT
TOWEL OR 17X26 10 PK STRL BLUE (TOWEL DISPOSABLE) ×2 IMPLANT
WATER STERILE IRR 1000ML POUR (IV SOLUTION) ×2 IMPLANT

## 2012-08-16 NOTE — Op Note (Signed)
Test test  Preop diagnosis right foot osteomyelitis, diabetes with chronic infection Postop diagnosis: Right foot osteomyelitis, diabetes with chronic infection  Procedure: Right below-knee dictation  Surgeon: Annell Greening M.D.  Anesthesia Gen. LMA  Tourniquet less than 20 minutes  EBL: Minimal  Procedure after induction general anesthesia patient had dressing on his foot and this was sealed off with a Betadine Steri-Drape. Prepping from the ankle up to the tourniquet proximal thigh that was applied over stockinette was performed with usual DuraPrep preoperative 2 g Ancef was given timeout procedure was completed while the DuraPrep was drying in the usual externally sheets drapes impervious stockinette Caban was used to seal from the ankle down and extremity sheets and drapes were applied. Leg was elevated sterile skin marker is used timeout procedure completed. A transverse incision was made 7 fingerbreadths below the tibial tubercle and completed with the posterior flat. Saphenous was clamped ligated. Tibia periosteum was elevated transverse cut made on the tibia with the oscillating saw with Tresa Endo behind it and then a. Spelled babbled. Gallbladder is used for dividing the fibula 2 cm above and once the a posterior skin had been cut and rotation type was used to fillet following the cortices of the fibula and the tibia posteriorly. Indicates that was placed on the red bag for pathologic exam. There was show calcification of the vessels. They were suture-ligated nurture cone straight including lesser saphenous peroneal posterior tibial and anterior tibial varus. Arteries were red double ligated small veins were coagulated with the cautery. Tourniquet was deflated after copious irrigation. Some small veins were coagulated. After copious irrigation little bit more thinning the flap the posterior gastroc soleus muscles brought up over the anterior surface of the tibia suture the periosteum and then  repaired to the anterior and posterior anterior aspect of the fascia for closure 2-0 Vicryl subtendinous tissue the Penrose drain was placed the skin was closed with 2-0 nylon postop dressing with any Xeroform 4 x 4's Webb roll cut split stockinette Ace wrap and knee immobilizer was applied. Patient tolerated the procedure well transferred to her room stable condition. Signed Annell Greening M.D.

## 2012-08-16 NOTE — Progress Notes (Signed)
Spoke with dr. Gwenyth Bouillon pt states he is a 10/10 lying in bed appears comfortable.given v/ss to dr. Noreene Larsson

## 2012-08-16 NOTE — Progress Notes (Signed)
Dr. Ophelia Charter at bedside , informed pt having a lot of pain , he said oh he is an anxious person look at him he looks fine.  Spoke with pt and told him plans for rehab

## 2012-08-16 NOTE — Progress Notes (Signed)
Leg elevated on pillow

## 2012-08-16 NOTE — Transfer of Care (Signed)
Immediate Anesthesia Transfer of Care Note  Patient: Lawrence [name]  Procedure(s) Performed: Procedure(s) with comments: AMPUTATION BELOW KNEE (Right) - Right Below Knee Amputation  Patient Location: PACU  Anesthesia Type:General  Level of Consciousness: awake, alert  and oriented  Airway & Oxygen Therapy: Patient Spontanous Breathing and Patient connected to nasal cannula oxygen  Post-op Assessment: Report given to PACU RN and Post -op Vital signs reviewed and stable  Post vital signs: Reviewed and stable  Complications: No apparent anesthesia complications

## 2012-08-16 NOTE — Interval H&P Note (Signed)
History and Physical Interval Note:  08/16/2012 12:28 PM  Lawrence [name]  has presented today for surgery, with the diagnosis of Right Foot Osteomyelitis   The various methods of treatment have been discussed with the patient and family. After consideration of risks, benefits and other options for treatment, the patient has consented to  Procedure(s) with comments: AMPUTATION BELOW KNEE (Right) - Right Below Knee Amputation as a surgical intervention .  The patient's history has been reviewed, patient examined, no change in status, stable for surgery.  I have reviewed the patient's chart and labs.  Questions were answered to the patient's satisfaction.     YATES,Kesley C   

## 2012-08-16 NOTE — Progress Notes (Signed)
Dr. Noreene Larsson called in regard to elevated B/P and pt has no relief of pain in right leg Dr. Noreene Larsson  is coming to see patient

## 2012-08-16 NOTE — Progress Notes (Signed)
Medication orders received , pt given labetalol 5mg  as ordered and tylenol infusion started as ordered

## 2012-08-16 NOTE — Anesthesia Preprocedure Evaluation (Addendum)
Anesthesia Evaluation  Patient identified by MRN, date of birth, ID band Patient awake    Reviewed: Allergy & Precautions, H&P , NPO status , Patient's Chart, lab work & pertinent test results, reviewed documented beta blocker date and time   History of Anesthesia Complications (+) PONV  Airway Mallampati: II TM Distance: >3 FB Neck ROM: Full    Dental  (+) Teeth Intact and Dental Advisory Given   Pulmonary neg pulmonary ROS,          Cardiovascular hypertension, Pt. on medications and Pt. on home beta blockers     Neuro/Psych PSYCHIATRIC DISORDERS Depression negative neurological ROS     GI/Hepatic negative GI ROS, Neg liver ROS,   Endo/Other  diabetes, Type 2, Insulin Dependent  Renal/GU Renal InsufficiencyRenal disease     Musculoskeletal negative musculoskeletal ROS (+)   Abdominal   Peds  Hematology negative hematology ROS (+)   Anesthesia Other Findings   Reproductive/Obstetrics negative OB ROS                          Anesthesia Physical Anesthesia Plan  ASA: IV  Anesthesia Plan:    Post-op Pain Management:    Induction:   Airway Management Planned:   Additional Equipment:   Intra-op Plan:   Post-operative Plan:   Informed Consent:   Plan Discussed with:   Anesthesia Plan Comments:         Anesthesia Quick Evaluation

## 2012-08-16 NOTE — Progress Notes (Signed)
Pt. Very upset and weepy on arrival to [facility].  Dr. Noreene Larsson notified and order for Versed given. Pt. Calm and drowsy after administration of Versed.  Vitals stable, sister at bedside.

## 2012-08-16 NOTE — Preoperative (Signed)
Beta Blockers   Reason not to administer Beta Blockers:Not Applicable 

## 2012-08-16 NOTE — Progress Notes (Signed)
Dr. Noreene Larsson looking at chart speaking with patient

## 2012-08-16 NOTE — Interval H&P Note (Signed)
History and Physical Interval Note:  08/16/2012 12:28 PM  Lawrence [name]  has presented today for surgery, with the diagnosis of Right Foot Osteomyelitis   The various methods of treatment have been discussed with the patient and family. After consideration of risks, benefits and other options for treatment, the patient has consented to  Procedure(s) with comments: AMPUTATION BELOW KNEE (Right) - Right Below Knee Amputation as a surgical intervention .  The patient's history has been reviewed, patient examined, no change in status, stable for surgery.  I have reviewed the patient's chart and labs.  Questions were answered to the patient's satisfaction.     Ajmal Kathan,Willaim C

## 2012-08-16 NOTE — Brief Op Note (Signed)
08/16/2012  3:15 PM  PATIENT:  Lawrence [name]  52 y.o. male  PRE-OPERATIVE DIAGNOSIS:  Right Foot Osteomyelitis   POST-OPERATIVE DIAGNOSIS:  Right Foot Osteomyelitis   PROCEDURE:  Procedure(s) with comments: AMPUTATION BELOW KNEE (Right) - Right Below Knee Amputation  SURGEON:  Surgeon(s) and Role:    * Eldred Manges, MD - Primary  PHYSICIAN ASSISTANT:   ASSISTANTS: none   ANESTHESIA:   general  EBL:  Total I/O In: 1250 [I.V.:1250] Out: 60 [Blood:60]  BLOOD ADMINISTERED:none  DRAINS: Penrose drain in the BKA incision   LOCAL MEDICATIONS USED:   SPECIMEN:  No Specimen  DISPOSITION OF SPECIMEN:  PATHOLOGY  COUNTS:  YES  TOURNIQUET:   Total Tourniquet Time Documented: Thigh (Right) - 15 minutes Total: Thigh (Right) - 15 minutes   DICTATION: .Other Dictation: Dictation Number [id number]  PLAN OF CARE: Admit to inpatient   PATIENT DISPOSITION:  PACU - hemodynamically stable.   Delay start of Pharmacological VTE agent (>24hrs) due to surgical blood loss or risk of bleeding: yes

## 2012-08-16 NOTE — Anesthesia Postprocedure Evaluation (Signed)
  Anesthesia Post-op Note  Patient: Lawrence [name]  Procedure(s) Performed: Procedure(s) with comments: AMPUTATION BELOW KNEE (Right) - Right Below Knee Amputation  Patient Location: PACU  Anesthesia Type:General  Level of Consciousness: awake, alert , sedated and patient cooperative  Airway and Oxygen Therapy: Patient Spontanous Breathing  Post-op Pain: mild  Post-op Assessment: Post-op Vital signs reviewed, Patient's Cardiovascular Status Stable, Respiratory Function Stable, Patent Airway, No signs of Nausea or vomiting and Pain level controlled  Post-op Vital Signs: stable  Complications: No apparent anesthesia complications

## 2012-08-16 NOTE — Progress Notes (Signed)
Dr. Ophelia Charter at bedside spoke with patient

## 2012-08-17 ENCOUNTER — Encounter (HOSPITAL_COMMUNITY): Payer: Self-pay | Admitting: Orthopaedic Surgery

## 2012-08-17 ENCOUNTER — Other Ambulatory Visit: Payer: Self-pay

## 2012-08-17 DIAGNOSIS — M869 Osteomyelitis, unspecified: Secondary | ICD-10-CM | POA: Diagnosis present

## 2012-08-17 LAB — GLUCOSE, CAPILLARY
Glucose-Capillary: 208 mg/dL — ABNORMAL HIGH (ref 70–99)
Glucose-Capillary: 134 mg/dL — ABNORMAL HIGH (ref 70–99)

## 2012-08-17 NOTE — Progress Notes (Signed)
Patient ID: Lawrence [name], male   DOB: 05/11/1960, 52 y.o.   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

## 2012-08-17 NOTE — Progress Notes (Signed)
Inpatient Diabetes Program Recommendations  AACE/ADA: New Consensus Statement on Inpatient Glycemic Control (2013)  Target Ranges:  Prepandial:   less than 140 mg/dL      Peak postprandial:   less than 180 mg/dL (1-2 hours)      Critically ill patients:  140 - 180 mg/dL   Results for LONDON, TARNOWSKI (MRN [id number]) as of 08/17/2012 11:13  Ref. Range 08/16/2012 11:55 08/16/2012 12:55 08/16/2012 14:55 08/16/2012 21:29 08/17/2012 06:30 08/17/2012 10:58  Glucose-Capillary Latest Range: 70-99 mg/dL 102 (H) 725 (H) 366 (H) 267 (H) 208 (H) 169 (H)    Inpatient Diabetes Program Recommendations Correction (SSI): Please order CBGs ACHS with Novolog sensitive correction scale while inpatient.  Note: Patient has a history of diabetes and takes Lantus 20 units QHS at home for diabetes management.  Currently, patient is ordered to receive Lantus 20 units QHS for inpatient glycemic control.  While inpatient, please order CBGs ACHS with Novolog sensitive correction in order to follow and maintain inpatient glycemic control.  Will continue to follow.  Thanks, Orlando Penner, RN, MSN, CCRN Diabetes Coordinator Inpatient Diabetes Program [phone]

## 2012-08-17 NOTE — Progress Notes (Signed)
Patient has not voided on his own. Various attempts with water running, warm compression to lower abdomen, and up out of bed with 2 assist.  We did bladder scan at 0200. It showed 786 ml.  We did I/O cath and removed 1000 ml of urine from bladder. We continue to monitor.

## 2012-08-17 NOTE — Evaluation (Signed)
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 He continued to have poor healing despite VAC treatment for over 2 months. Admitted for right below knee amputation.   Clinical Impression   Patient present with increased pain and significantly limited mobility and overall weakness.Patient will benefit from skilled OT to address increased independence with BADL.  Do not recommend discharge home until further rehabilitation to be received at either SNF or CIR.  Patient lives with a mentally challenged uncle who cannot provide physical assistance.    OT Assessment  Patient needs continued OT Services    Follow Up Recommendations  SNF;CIR    Barriers to Discharge Decreased caregiver support    Equipment Recommendations   (TBD in next venue)    Recommendations for Other Services Rehab consult  Frequency  Min 2X/week    Precautions / Restrictions Precautions Precautions: Fall Required Braces or Orthoses: Knee Immobilizer - Right Restrictions Weight Bearing Restrictions: Yes RLE Weight Bearing: Non weight bearing   Pertinent Vitals/Pain "has been an 8/10 in RLE all day and now that I'm medicated and finally comfortable I am a 3/10"    ADL  Grooming: Simulated;Set up Where Assessed - Grooming: Supported sitting Upper Body Bathing: Simulated;Set up Where Assessed - Upper Body Bathing: Supported sitting Lower Body Bathing: Simulated;+1 Total assistance Where Assessed - Lower Body Bathing: Supported sitting;Lean right and/or left Upper Body Dressing: Simulated;Minimal  assistance Where Assessed - Upper Body Dressing: Supported sitting Lower Body Dressing: Simulated;+1 Total assistance Where Assessed - Lower Body Dressing: Supported sitting;Lean right and/or left Transfers/Ambulation Related to ADLs: patient declined to attempt stand, "I finally got comfortable" ADL Comments: Patient's sister concerned that patient has been "babied" all of his life that even though he says he wants to be able to go home, she is concerned that he might not be motivated to work as hard as he will need to to be able to go home and be ready for prosthetic when Dr. Ophelia [name] says he will.    OT Diagnosis: Generalized weakness;Acute pain  OT Problem List: Decreased strength;Decreased activity tolerance;Decreased safety awareness;Decreased knowledge of use of DME or AE;Decreased knowledge of precautions;Pain OT Treatment Interventions: Self-care/ADL training;Therapeutic exercise;Energy conservation;DME and/or AE instruction;Therapeutic activities;Patient/family education;Balance training   OT Goals(Current goals can be found in the care plan section) Acute Rehab OT Goals Patient Stated Goal: Get a prosthesis in 6 weeks like the surgeon told me OT Goal Formulation: With patient/family Time For Goal Achievement: 08/31/12 Potential to Achieve Goals: Fair  Visit Information  Last OT Received On: 08/17/12 Assistance Needed: +1 History of Present Illness: Pt with peripheral artery disease and uncontrolled DM with 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 He continued to have poor healing despite VAC treatment for over 2 months. Admitted for right below knee amputation.       Prior Functioning     Home Living Family/patient expects to be discharged to:: Skilled nursing facility Living Arrangements: Other relatives (mentally challenged uncle) Additional Comments:  has  been walking with RW since toe amputation 3 weeks ago; was indpendent without AD prior to surgery.  Sister would like for CIR to evaluate for CIR Prior Function Level of Independence: Independent with assistive device(s) Comments: using RW to get around; not driving; motorized cart for shopping;  Communication Communication: No difficulties Dominant Hand: Left         Vision/Perception     Cognition  Cognition Arousal/Alertness: Awake/alert ("medication making me sleepy") Overall Cognitive Status: Within Functional Limits for tasks assessed    Extremity/Trunk Assessment Upper Extremity Assessment Upper Extremity Assessment: RUE deficits/detail;LUE deficits/detail RUE Deficits / Details: generalized weakness secondary to sedentary PTA, triceps grossly 3+/5 LUE Deficits / Details: generalized weakness secondary to sedentary PTA, triceps grossly 3+/5 Lower Extremity Assessment Lower Extremity Assessment:  (per PT eval) RLE Deficits / Details: s/p BKA; limited hip internal rotation (able to achieve neutral after stretches); knee extension 0; knee flexion 80 LLE Deficits / Details: ROM: holds leg in external rotation with AAROM to neutral rotation after stretching;; knee WFL; ankle WFL; overall weak with grossly 3+/5 LLE Sensation: history of peripheral neuropathy Cervical / Trunk Assessment Cervical / Trunk Assessment: Kyphotic     Mobility N/T with OT, patient declined attempt stand or transfer.  Per PT note, patient unable to stand in AM and when using STEDY lift and suggested that patient use a sliding board for transfer for now.    End of Session OT - End of Session Activity Tolerance: Patient limited by pain;Patient limited by fatigue Patient left: in chair;with call bell/phone within reach;with family/visitor present  GO     Lawrence [name] 08/17/2012, 5:11 PM

## 2012-08-17 NOTE — Progress Notes (Signed)
Subjective: 1 Day Post-Op Procedure(s) (LRB): AMPUTATION BELOW KNEE (Right) Patient reports pain as moderate.   "I feel weak"  Sitting in chair and eating breakfast Objective: Vital signs in last 24 hours: Temp:  [97.6 F (36.4 C)-98.6 F (37 C)] 98.2 F (36.8 C) (07/10 0539) Pulse Rate:  [68-96] 69 (07/10 0539) Resp:  [8-20] 16 (07/10 0539) BP: (112-212)/(72-100) 152/76 mmHg (07/10 0539) SpO2:  [98 %-100 %] 100 % (07/10 0539)  Intake/Output from previous day: 07/09 0701 - 07/10 0700 In: 1250 [I.V.:1250] Out: 1060 [Urine:1000; Blood:60] Intake/Output this shift:     Recent Labs  08/15/12 1400 08/16/12 2001  HGB 11.6* 10.7*    Recent Labs  08/15/12 1400 08/16/12 2001  WBC 6.4 6.3  RBC 4.36 4.06*  HCT 33.7* 31.4*  PLT 154 122*    Recent Labs  08/15/12 1400 08/16/12 2001  NA 136  --   K 4.9  --   CL 103  --   CO2 27  --   BUN 30*  --   CREATININE 1.19 1.20  GLUCOSE 218*  --   CALCIUM 9.7  --     Recent Labs  08/15/12 1400  INR 0.94    Incision: dressing C/D/I and knee immobilizer in place to prevent flexion contracture of knee  Assessment/Plan: 1 Day Post-Op Procedure(s) (LRB): AMPUTATION BELOW KNEE (Right) Discharge to SNF when bed available.  Consult ordered.  Pt interested in [facility]  Natascha Edmonds M 08/17/2012, 8:40 AM

## 2012-08-17 NOTE — Progress Notes (Signed)
1600 Pt still unable to void  Finally agreed to be cathed after bladder scaN  showed 999cc 1200cc obtained. Refused to have foley put in.

## 2012-08-17 NOTE — Evaluation (Signed)
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 He continued to have poor healing despite VAC treatment for over 2 months. Admitted for right below knee amputation.  Clinical Impression  Patient is s/p Rt BKA surgery resulting in functional limitations due to the deficits listed below (see PT Problem List). Pt essentially lives alone (his housemate is "mentally challenged" per pt and cannot assist him. Pt understands continued therapy post-acute at SNF level of care will help him achieve his goal to walk again. Patient will benefit from skilled PT to increase their independence and safety with mobility to allow discharge to the venue listed below.       PT Assessment  Patient needs continued PT services    Follow Up Recommendations  SNF;Supervision/Assistance - 24 hour    Does the patient have the potential to tolerate intense rehabilitation      Barriers to Discharge Decreased caregiver support      Equipment Recommendations  Wheelchair (measurements PT);Wheelchair cushion (measurements PT);Other (comment) (? sliding board)    Recommendations for Other Services OT consult   Frequency Min 3X/week    Precautions / Restrictions Precautions Precautions: Fall Required Braces or Orthoses: Knee Immobilizer - Right   Pertinent Vitals/Pain Rt BKA pain 6/10; down to 5/10 after activity; patient repositioned for comfort       Mobility  Bed Mobility Bed Mobility: Not assessed Details for Bed Mobility Assistance: pt up in chair with nursing assist (posterior  transfer) Transfers Transfers: Sit to Stand Sit to Stand: Other (comment) (unable from recliner with steady) Transfer via Lift Equipment: Stedy (attempted, not yet able to stand) Details for Transfer Assistance: pt up to chair with 2 person assist via posterior transfer (per nursing); attempted steady after pt demonstrated 4/5 UE strength and reports he was ambulatory with RW just PTA; pt with incr anxiety and difficulty positioning Rt BKA in a comfortable position to fully attempt stand with steady; educated on option of using a sliding board and anticipate this will be best method in the short-term; pt agrees to continue to work towards [name] with RW as his goal is to get a prosthesis and return to walking Ambulation/Gait Ambulation/Gait Assistance: Not tested (comment)    Exercises Amputee Exercises Quad Sets: AROM;Right;10 reps;Seated Hip Flexion/Marching: AROM;Right;5 reps;Seated Knee Flexion: AROM;Right;10 reps;Seated Knee Extension: AROM;Right;5 reps;Seated Chair Push Up: AROM;Both;Other reps (comment);Seated (7 reps)   PT Diagnosis: Difficulty walking;Generalized weakness;Acute pain;Other (comment) (new BKA)  PT Problem List: Decreased strength;Decreased range of motion;Decreased activity tolerance;Decreased balance;Decreased mobility;Decreased knowledge of use of DME;Impaired sensation;Pain PT Treatment Interventions: DME instruction;Functional mobility training;Therapeutic activities;Therapeutic exercise;Balance training;Patient/family education;Other (comment);Wheelchair mobility training Public librarian)     PT Goals(Current goals can be found in the care plan section) Acute Rehab PT Goals Patient Stated Goal: GEt a prosthesis in 6 weeks PT Goal Formulation: With patient Time For Goal Achievement: 08/31/12 Potential to Achieve Goals: Good  Visit Information  Last PT Received On: 08/17/12 Assistance Needed: +2 History of Present Illness: Pt with peripheral artery disease and  uncontrolled DM with 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 He continued to have poor healing despite VAC treatment for over 2 months. Admitted for right below knee amputation.       Prior Functioning  Home Living Family/patient expects to be discharged to:: Skilled nursing facility Prior Function Level of Independence: Independent with assistive device(s) Comments: using RW to get around; not driving; motorized cart for shopping;  Communication Communication: No difficulties    Cognition  Cognition Arousal/Alertness: Awake/alert Behavior During Therapy: Anxious Overall Cognitive Status: Within Functional Limits for tasks assessed    Extremity/Trunk Assessment Upper Extremity Assessment Upper Extremity Assessment: Defer to OT evaluation;RUE deficits/detail;LUE deficits/detail Lower Extremity Assessment Lower Extremity Assessment: RLE deficits/detail;LLE deficits/detail RLE Deficits / Details: s/p BKA; limited hip internal rotation (able to achieve neutral after stretches); knee extension 0; knee flexion 80 LLE Deficits / Details: ROM: holds leg in external rotation with AAROM to neutral rotation after stretching;; knee WFL; ankle WFL; overall weak with grossly 3+/5 LLE Sensation: history of peripheral neuropathy Cervical / Trunk Assessment Cervical / Trunk Assessment: Kyphotic   Balance Balance Balance Assessed: Yes Static Sitting Balance Static Sitting - Balance Support: No upper extremity supported;Feet unsupported Static Sitting - Level of Assistance: 7: Independent (at edge of chair) Dynamic Sitting Balance Dynamic Sitting - Balance Support: Right upper extremity supported;Feet unsupported Dynamic Sitting - Level of Assistance: 5: Stand by assistance  End of Session PT - End of Session Equipment Utilized During Treatment: Right knee immobilizer;Other  (comment) (removed and re-wrapped ace underneath) Activity Tolerance: Patient limited by fatigue;Patient limited by pain Patient left: in chair;with call bell/phone within reach Nurse Communication: Mobility status;Need for lift equipment;Other (comment) (OK to leave knee immobilizer off for up to one hour)  GP     Pace Lamadrid 08/17/2012, 10:44 AM Pager [phone]

## 2012-08-18 DIAGNOSIS — S88119A Complete traumatic amputation at level between knee and ankle, unspecified lower leg, initial encounter: Secondary | ICD-10-CM

## 2012-08-18 DIAGNOSIS — I739 Peripheral vascular disease, unspecified: Secondary | ICD-10-CM

## 2012-08-18 DIAGNOSIS — L98499 Non-pressure chronic ulcer of skin of other sites with unspecified severity: Secondary | ICD-10-CM

## 2012-08-18 LAB — URINALYSIS, ROUTINE W REFLEX MICROSCOPIC
Glucose, UA: NEGATIVE mg/dL
Leukocytes, UA: NEGATIVE
Protein, ur: 100 mg/dL — AB
Specific Gravity, Urine: 1.012 (ref 1.005–1.030)

## 2012-08-18 LAB — GLUCOSE, CAPILLARY
Glucose-Capillary: 106 mg/dL — ABNORMAL HIGH (ref 70–99)
Glucose-Capillary: 108 mg/dL — ABNORMAL HIGH (ref 70–99)
Glucose-Capillary: 128 mg/dL — ABNORMAL HIGH (ref 70–99)

## 2012-08-18 LAB — URINE MICROSCOPIC-ADD ON

## 2012-08-18 MED ORDER — TAMSULOSIN HCL 0.4 MG PO CAPS
0.4000 mg | ORAL_CAPSULE | Freq: Every day | ORAL | Status: DC
  Administered 2012-08-18 – 2012-08-21 (×4): 0.4 mg via ORAL
  Filled 2012-08-18 (×5): qty 1

## 2012-08-18 NOTE — Progress Notes (Addendum)
Physical Therapy Treatment Patient Details Name: Lawrence [name] MRN: [id number] DOB: Jan 08, 1961 Today's Date: 08/18/2012 Time: 8657-8469 PT Time Calculation (min): 41 min  PT Assessment / Plan / Recommendation  PT Comments   Patient seen this morning by therapy and was able to increase activity tolerance greatly and was motivated to work on transfer and overall mobility. Patient was denied SNF and is planning to have to return home with limited to no assistance. Patient able to slide board transfer today with Min A from bed to recliner (+2 for squat pivot transfer x2 with increase tolerance from yesterday). I believe that patient could benefit from CIR and potentially become Mod I with transfers. MD came into room while patient was transferring and stated that they would make a CIR consult and discuss this option. Patient is very motivated and I think would make a great CIR candidate. Updated DC plans and frequency in POC.   Follow Up Recommendations  CIR     Does the patient have the potential to tolerate intense rehabilitation     Barriers to Discharge        Equipment Recommendations  Wheelchair (measurements PT);Wheelchair cushion (measurements PT);Other (comment);3in1 (PT) (slide board. WC with removable arm rest. Droparm BSC)    Recommendations for Other Services    Frequency Min 5X/week   Progress towards PT Goals Progress towards PT goals: Progressing toward goals  Plan Discharge plan needs to be updated    Precautions / Restrictions Precautions Precautions: Fall Required Braces or Orthoses: Knee Immobilizer - Right Restrictions Weight Bearing Restrictions: Yes RLE Weight Bearing: Non weight bearing   Pertinent Vitals/Pain Complained of pain but not rated and did not want to ask for meds at this time    Mobility  Bed Mobility Details for Bed Mobility Assistance: pt up in chair with nursing assist (posterior transfer) Transfers Transfers: Squat Pivot  Transfers;Lateral/Scoot Wellsite geologist Transfers: 1: +2 Total assist Squat Pivot Transfers: Patient Percentage: 40% Lateral/Scoot Transfers: 4: Min assist;With armrests removed;From elevated surface;With slide board Details for Transfer Assistance: Patient squat pivot x2 this session from recliner to drop arm recliner to bed in prep for using slide board. Cues for positioning and hand placement when coming up to squat. Rocking technique and pad utilized for pivot. One on bed patient was able transfer to drop arm recliner via slideboard with Min A and cueing throughout for technique and positioning.  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 He continued to have poor healing despite VAC treatment for over 2 months. Admitted for right below knee amputation.    Subjective Data      Cognition  Cognition Arousal/Alertness: Awake/alert Behavior During Therapy: WFL for tasks assessed/performed Overall Cognitive Status: Within Functional Limits for tasks assessed    Balance  Static Sitting Balance Static Sitting - Balance Support: Bilateral upper extremity supported;Feet unsupported Static Sitting -  Level of Assistance: 7: Independent Dynamic Sitting Balance Dynamic Sitting - Balance Support: Right upper  extremity supported;Feet unsupported Dynamic Sitting - Level of Assistance: 5: Stand by assistance  End of Session PT - End of Session Equipment Utilized During Treatment: Right knee immobilizer;Other (comment) Activity Tolerance: Patient tolerated treatment well Patient left: in chair;with call bell/phone within reach Nurse Communication: Mobility status   GP     Fredrich Birks 08/18/2012, 10:56 AM 08/18/2012 Fredrich Birks PTA [phone] pager [phone] office

## 2012-08-18 NOTE — Progress Notes (Signed)
Subjective: 2 Days Post-Op Procedure(s) (LRB): AMPUTATION BELOW KNEE (Right) Patient reports pain as mild. Difficulty voiding.  Has had I&O cath. Denied by Medicaid for NHP.  Has no assist at home and not stable for discharge until he is able to transfer and care for himself.   Rehab consult ordered.   Objective: Vital signs in last 24 hours: Temp:  [98.3 F (36.8 C)-99.4 F (37.4 C)] 98.5 F (36.9 C) (07/11 0649) Pulse Rate:  [65-78] 65 (07/11 0943) Resp:  [16-18] 18 (07/11 0649) BP: (140-166)/(56-70) 151/56 mmHg (07/11 0943) SpO2:  [98 %-100 %] 98 % (07/11 0649)  Intake/Output from previous day: 07/10 0701 - 07/11 0700 In: 2010 [I.V.:2010] Out: 3300 [Urine:3300] Intake/Output this shift:     Recent Labs  08/15/12 1400 08/16/12 2001  HGB 11.6* 10.7*    Recent Labs  08/15/12 1400 08/16/12 2001  WBC 6.4 6.3  RBC 4.36 4.06*  HCT 33.7* 31.4*  PLT 154 122*    Recent Labs  08/15/12 1400 08/16/12 2001  NA 136  --   K 4.9  --   CL 103  --   CO2 27  --   BUN 30*  --   CREATININE 1.19 1.20  GLUCOSE 218*  --   CALCIUM 9.7  --     Recent Labs  08/15/12 1400  INR 0.94    Incision: dressing C/D/I  Assessment/Plan: 2 Days Post-Op Procedure(s) (LRB): AMPUTATION BELOW KNEE (Right) Up with therapy flomax for urinary retention and will check UA Rehab consult.  Ariona Deschene M 08/18/2012, 10:59 AM

## 2012-08-18 NOTE — Progress Notes (Signed)
Discussed with Wyatt Portela, PTA. Apparently pt cannot go to SNF due to insurance issues. He has very limited assistance at home. He was independent at home PTA. Agree with Inpatient Rehab Consult with goal for modified Independence on d/c from rehab to his home.  08/18/2012 Veda Canning, PT Pager: [phone]

## 2012-08-18 NOTE — Consult Note (Signed)
Physical Medicine and Rehabilitation Consult Reason for Consult: Right foot osteomyelitis--R BKA Referring Physician: Dr. Ophelia Charter   HPI: Lawrence [name] is a 52 y.o. male with history of DM, CKD, nonhealing ulcer right foot with osteomyelitis requiring Ray 05/03/12. Patient with wound dehiscence, poor healing despite VAC and significant small vessel disease. He was admitted on 08/16/12 for R-BKA by Dr. Ophelia Charter. Post op with difficulty voiding requiring in and out caths. He is limited by pain, anxiety and deconditioning. MD and therapy recommending CIR.  Patient has had limited PT OT thus far Review of Systems  Constitutional: Positive for malaise/fatigue.  HENT: Negative for hearing loss.   Eyes: Negative for blurred vision and double vision.  Respiratory: Negative for cough, shortness of breath and wheezing.   Cardiovascular: Negative for chest pain and palpitations.  Gastrointestinal: Negative for heartburn, nausea and vomiting.  Genitourinary: Negative for urgency and frequency.  Musculoskeletal: Positive for joint pain.  Neurological: Positive for sensory change (numbness bilateral feet). Negative for headaches.   Past Medical History  Diagnosis Date  . Hypertension   . Chronic kidney disease   . Hypercholesteremia   . Diabetes mellitus     insulin dependent  . Depression   . PONV (postoperative nausea and vomiting)    Past Surgical History  Procedure Laterality Date  . Amputation Right 05/03/2012    Procedure: Right First Ray AMPUTATION;  Surgeon: Eldred Manges, MD;  Location: [facility] OR;  Service: Orthopedics;  Laterality: Right;  . I&d extremity Right 05/29/2012    Procedure: IRRIGATION AND DEBRIDEMENT EXTREMITY- right foot;  Surgeon: Eldred Manges, MD;  Location: [facility] OR;  Service: Orthopedics;  Laterality: Right;  Right Foot Debridement, VAC Application  . Toe amputation Right 05/29/2012    1ST  ray      Dr Ophelia Charter  . Amputation Right 08/16/2012    Procedure: AMPUTATION BELOW KNEE;  Surgeon:  Eldred Manges, MD;  Location: [facility] OR;  Service: Orthopedics;  Laterality: Right;  Right Below Knee Amputation   History reviewed. No pertinent family history.  Social History:  Single. 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  Has required RW past month. Live with  Mentally challenged uncle who can help with housework/meal prep past discharge. Sister unable to provide assist. He reports that he has never smoked. He has never used smokeless tobacco. He reports that  drinks alcohol. He reports that he does not use illicit drugs.  Allergies: No Known Allergies  Medications Prior to Admission  Medication Sig Dispense Refill  . aspirin 81 MG tablet Take 81 mg by mouth every morning.       Marland Kitchen doxycycline (VIBRA-TABS) 100 MG tablet Take 50 mg by mouth 2 (two) times daily.      Marland Kitchen HYDROcodone-acetaminophen (NORCO/VICODIN) 5-325 MG per tablet Take 1-2 tablets by mouth every 4 (four) hours as needed. For pain      . insulin glargine (LANTUS) 100 UNIT/ML injection Inject 20 Units into the skin at bedtime.  10 mL  6  . metoprolol tartrate (LOPRESSOR) 25 MG tablet Take 1 tablet (25 mg total) by mouth 2 (two) times daily.  180 tablet  3    Home: Home Living Family/patient expects to be discharged to:: Skilled nursing facility Living Arrangements: Other relatives (mentally challenged uncle) Additional Comments: has been walking with RW since toe amputation 3 weeks ago; was indpendent without AD prior to surgery.  Sister would like for CIR to evaluate for CIR  Functional History: Prior  Function Comments: using RW to get around; not driving; motorized cart for shopping;  Functional Status:  Mobility: Bed Mobility Bed Mobility: Not assessed Transfers Transfers: Sit to Stand Sit to Stand: Other (comment) (unable from recliner with steady) Transfer via Lift Equipment: Stedy (attempted, not yet able to stand) Ambulation/Gait Ambulation/Gait Assistance: Not tested (comment)    ADL: ADL Grooming:  Simulated;Set up Where Assessed - Grooming: Supported sitting Upper Body Bathing: Simulated;Set up Where Assessed - Upper Body Bathing: Supported sitting Lower Body Bathing: Simulated;+1 Total assistance Where Assessed - Lower Body Bathing: Supported sitting;Lean right and/or left Upper Body Dressing: Simulated;Minimal assistance Where Assessed - Upper Body Dressing: Supported sitting Lower Body Dressing: Simulated;+1 Total assistance Where Assessed - Lower Body Dressing: Supported sitting;Lean right and/or left Transfers/Ambulation Related to ADLs: patient declined to attempt stand, "I finally got comfortable" ADL Comments: Patient's sister concerned that patient has been "babied" all of his life that even though he says he wants to be able to go home, she is concerned that he might not be motivated to work as hard as he will need to to be able to go home and be ready for prosthetic when Dr. Ophelia Charter says he will.  Cognition: Cognition Overall Cognitive Status: Within Functional Limits for tasks assessed Orientation Level: Oriented X4 Cognition Arousal/Alertness: Awake/alert ("medication making me sleepy") Behavior During Therapy: Anxious Overall Cognitive Status: Within Functional Limits for tasks assessed  Blood pressure 151/56, pulse 65, temperature 98.5 F (36.9 C), temperature source Oral, resp. rate 18, SpO2 98.00%.  Physical Exam  Nursing note and vitals reviewed. Constitutional: He is oriented to person, place, and time. He appears well-nourished.  HENT:  Head: Normocephalic and atraumatic.  Eyes: Pupils are equal, round, and reactive to light.  Neck: Normal range of motion. Neck supple.  Cardiovascular: Normal rate and regular rhythm.   No murmur heard. Pulmonary/Chest: Effort normal and breath sounds normal. No respiratory distress. He has no wheezes.  Abdominal: Soft. Bowel sounds are normal. He exhibits no distension. There is no tenderness.  Musculoskeletal: He exhibits  edema.  BUE with muscle wasting distally. Left foot with 2+edema. R-BKA with padded surgical dressing.   Neurological: He is alert and oriented to person, place, and time.  Skin: Skin is warm and dry.  Psychiatric: His speech is normal. His affect is blunt. He is withdrawn.   motor strength is 4/5 in bilateral deltoid, bicep, tricep, grip Left, hip flexors, knee extensors, ankle dorsi flexion plantarflexion  R LE with BKA has 3-/5 HF Sensation reduced to sharp touch in both hands and left foot  Results for orders placed during the hospital encounter of 08/16/12 (from the past 24 hour(s))  GLUCOSE, CAPILLARY     Status: Abnormal   Collection Time    08/17/12 10:58 AM      Result Value Range   Glucose-Capillary 169 (*) 70 - 99 mg/dL   Comment 1 Notify RN    GLUCOSE, CAPILLARY     Status: Abnormal   Collection Time    08/17/12  3:54 PM      Result Value Range   Glucose-Capillary 134 (*) 70 - 99 mg/dL   Comment 1 Notify RN    GLUCOSE, CAPILLARY     Status: Abnormal   Collection Time    08/17/12  9:17 PM      Result Value Range   Glucose-Capillary 145 (*) 70 - 99 mg/dL  GLUCOSE, CAPILLARY     Status: None   Collection Time  08/18/12  6:45 AM      Result Value Range   Glucose-Capillary 79  70 - 99 mg/dL   No results found.  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 1. Does the need for close, 24 hr/day medical supervision in concert with the patient's rehab needs make it unreasonable for this patient to be served in a less intensive setting? Yes 2. Co-Morbidities requiring supervision/potential complications: Diabetes with neuropathy, stage IV chronic kidney disease 3. Due to bladder management, bowel management, safety, skin/wound care, disease management, medication administration, pain management and patient education, does the patient require 24 hr/day rehab nursing? Yes 4. Does the patient require coordinated care of a physician,  rehab nurse, PT (1-2 hrs/day, 5 days/week) and OT (1-2 hrs/day, 5 days/week) to address physical and functional deficits in the context of the above medical diagnosis(es)? Yes Addressing deficits in the following areas: balance, endurance, locomotion, strength, transferring, bowel/bladder control, bathing, dressing, feeding, grooming and toileting 5. Can the patient actively participate in an intensive therapy program of at least 3 hrs of therapy per day at least 5 days per week? Potentially 6. The potential for patient to make measurable gains while on inpatient rehab is fair 7. Anticipated functional outcomes upon discharge from inpatient rehab are supervision to modified independent with PT, supervision to modified independent with OT, not applicable with SLP. 8. Estimated rehab length of stay to reach the above functional goals is: 10-12 days 9. Does the patient have adequate social supports to accommodate these discharge functional goals? Potentially 10. Anticipated D/C setting: Home 11. Anticipated post D/C treatments: HH therapy 12. Overall Rehab/Functional Prognosis: good  RECOMMENDATIONS: This patient's condition is appropriate for continued rehabilitative care in the following setting: CIR Patient has agreed to participate in recommended program. Yes Note that insurance prior authorization may be required for reimbursement for recommended care.  Comment:     08/18/2012

## 2012-08-18 NOTE — Progress Notes (Signed)
Rehab Admissions Coordinator Note:  Patient was screened by Clois Dupes for appropriateness for an Inpatient Acute Rehab Consult.  At this time, we are recommending Inpatient Rehab consult.  Clois Dupes 08/18/2012, 8:37 AM  I can be reached at [phone].

## 2012-08-18 NOTE — Progress Notes (Signed)
UR COMPLETED  

## 2012-08-19 DIAGNOSIS — R339 Retention of urine, unspecified: Secondary | ICD-10-CM | POA: Diagnosis not present

## 2012-08-19 LAB — GLUCOSE, CAPILLARY
Glucose-Capillary: 102 mg/dL — ABNORMAL HIGH (ref 70–99)
Glucose-Capillary: 113 mg/dL — ABNORMAL HIGH (ref 70–99)
Glucose-Capillary: 143 mg/dL — ABNORMAL HIGH (ref 70–99)

## 2012-08-19 NOTE — Progress Notes (Signed)
Clinical Social Work Department BRIEF PSYCHOSOCIAL ASSESSMENT 08/19/2012  Patient:  Lawrence [name], Lawrence [name]     Account Number:  [id number]     Admit date:  08/16/2012  Clinical Social Worker:  Lourdes Sledge  Date/Time:  08/19/2012 03:12 PM  Referred by:  Physician  Date Referred:  08/19/2012 Referred for  SNF Placement   Other Referral:   Interview type:  Patient Other interview type:    PSYCHOSOCIAL DATA Living Status:  FAMILY Admitted from facility:   Level of care:   Primary support name:  [name] [phone] Primary support relationship to patient:  SIBLING Degree of support available:   Pt states he has limited family support. Pt lives with uncle who pt states is "mentally challenged," and unable to assist very much.    CURRENT CONCERNS Current Concerns  Post-Acute Placement   Other Concerns:    SOCIAL WORK ASSESSMENT / PLAN CSW informed that PT is recommending CIR however pt has limited amount of support at home and will potentially need SNF at discharge.    CSW visited pt room and introduced herself and role. CSW explored amount of support pt has at home. Pt stated he has very limited support and lives with his uncle who pt stated is "mentally challenged. Pt stated his uncle is able to cook and assist him with lifting however if pt falls uncle will not be able to lift pt. Pt denied having any other family support. Pt stated he is hopeful for CIR placement however is unsure how he will manage at home if unable to be admitted into CIR. CSW explored pt interest in SNF placement. Pt stated he really did not want to go to a Rehab facility however did not feel that he would have a choice. CSW informed pt of challenges with finding SNf placement due to pt insurance however CSW could do a SNF search for [place] and surrounding counties. Pt stated understanding that SNF placement could be outside of [facility]. Pt at this moment is agreeable for his information to be sent to  facilities.    CSW to fax pt out and submit for a pasarr. Unit CSW to follow up on bed offers.   Assessment/plan status:  Psychosocial Support/Ongoing Assessment of Needs Other assessment/ plan:   Information/referral to community resources:   SNF list provided to patient.    PATIENT'S/FAMILY'S RESPONSE TO PLAN OF CARE: Pt sitting on the recliner alert and oriented. Pt was very pleasant and at this moment is agreeable to SNF placement. Pt aware of challenges with finding placement. Pt hopeful for CIR.    CSW to remain following.       Theresia Bough, MSW, Theresia Majors [phone]

## 2012-08-19 NOTE — Progress Notes (Addendum)
Subjective: 3 Days Post-Op Procedure(s) (LRB): AMPUTATION BELOW KNEE (Right) Patient reports pain as 7 on 0-10 scale and 8 on 0-10 scale.    Objective: Vital signs in last 24 hours: Temp:  [97.5 F (36.4 C)-98.8 F (37.1 C)] 97.5 F (36.4 C) (07/12 0551) Pulse Rate:  [70-83] 77 (07/12 1030) Resp:  [18] 18 (07/12 0551) BP: (130-137)/(60-64) 137/60 mmHg (07/12 1030) SpO2:  [100 %] 100 % (07/12 0551)  Intake/Output from previous day: 07/11 0701 - 07/12 0700 In: 1875 [I.V.:1875] Out: 1600 [Urine:1600] Intake/Output this shift: Total I/O In: 360 [P.O.:360] Out: -    Recent Labs  08/16/12 2001  HGB 10.7*    Recent Labs  08/16/12 2001  WBC 6.3  RBC 4.06*  HCT 31.4*  PLT 122*    Recent Labs  08/16/12 2001  CREATININE 1.20   No results found for this basename: LABPT, INR,  in the last 72 hours  Compartment soft  Assessment/Plan: 3 Days Post-Op Procedure(s) (LRB): AMPUTATION BELOW KNEE (Right) Discharge to SNF  Needs foley, had I and O cath for 1200 cc and catheterized for 600 plus since then. Reinsert foley times 2 days then will D/C foley and retry voiding trial.    YATES,Josten C 08/19/2012, 11:43 AM Post op urinary retention added to problem list.

## 2012-08-19 NOTE — Progress Notes (Signed)
Patient has now went about 10 hours since voiding.  Pt wants to be catherized latter part of shift. Pt educated by nurse and nurse tech but continues to refuse. Will monitor pt for remainder of shift.

## 2012-08-19 NOTE — Progress Notes (Addendum)
Clinical Social Work Department CLINICAL SOCIAL WORK PLACEMENT NOTE 08/19/2012  Patient:  Lawrence [name], Lawrence [name]  Account Number:  [id number] Admit date:  08/16/2012  Clinical Social Worker:  Theresia Bough, Theresia Majors  Date/time:  08/19/2012 03:20 PM  Clinical Social Work is seeking post-discharge placement for this patient at the following level of care:   SKILLED NURSING   (*CSW will update this form in Epic as items are completed)   08/19/2012  Patient/family provided with [facility] Department of Clinical Social Work's list of facilities offering this level of care within the geographic area requested by the patient (or if unable, by the patient's family).  08/19/2012  Patient/family informed of their freedom to choose among providers that offer the needed level of care, that participate in Medicare, Medicaid or managed care program needed by the patient, have an available bed and are willing to accept the patient.  08/19/2012  Patient/family informed of [facility]' ownership interest in [facility], as well as of the fact that they are under no obligation to receive care at this facility.  PASARR submitted to EDS on 08/19/2012 PASARR number received from [employer] on 08/18/12 [id number]   FL2 transmitted to all facilities in geographic area requested by pt/family on  08/19/2012 FL2 transmitted to all facilities within larger geographic area on   Patient informed that his/her managed care company has contracts with or will negotiate with  certain facilities, including the following:     Patient/family informed of bed offers received:   Patient chooses bed at  Physician recommends and patient chooses bed at    Patient to be transferred to  on   Patient to be transferred to facility by   The following physician request were entered in Epic:   Additional Comments:  Theresia Bough, MSW, [employer] [phone]

## 2012-08-19 NOTE — Progress Notes (Signed)
Physical Therapy Treatment Patient Details Name: Lawrence [name] MRN: [id number] DOB: [birth year] Today's Date: 08/19/2012 Time: 4098-1191 PT Time Calculation (min): 24 min  PT Assessment / Plan / Recommendation  PT Comments     Follow Up Recommendations  CIR     Does the patient have the potential to tolerate intense rehabilitation     Barriers to Discharge        Equipment Recommendations  Wheelchair (measurements PT);Wheelchair cushion (measurements PT);Other (comment);3in1 (PT) (slide board)    Recommendations for Other Services OT consult  Frequency Min 5X/week   Progress towards PT Goals Progress towards PT goals: Progressing toward goals  Plan Current plan remains appropriate    Precautions / Restrictions Precautions Precautions: Fall Restrictions RLE Weight Bearing: Non weight bearing   Pertinent Vitals/Pain 7/10    Mobility  Bed Mobility Bed Mobility: Not assessed Details for Bed Mobility Assistance: Pt up in chair.  He reports sleeping in recliner last night. Transfers Sit to Stand: 1: +2 Total assist;From chair/3-in-1;With armrests Sit to Stand: Patient Percentage: 40% Details for Transfer Assistance: Pt refusing slide board transfers to/from chair at this time, preferring to address sit to stand only.      Exercises Amputee Exercises Quad Sets: AROM;Right;10 reps Hip ABduction/ADduction: AAROM;Right;10 reps Straight Leg Raises: AAROM;Right;10 reps Other Exercises Other Exercises: Pt provided with orange theraband to perform UE exercises independently in room.  Pt instructed and performed horizontal abduction and PNF diagonal patterns.   PT Diagnosis:    PT Problem List:   PT Treatment Interventions:     PT Goals (current goals can now be found in the care plan section)    Visit Information  Last PT Received On: 08/19/12 Assistance Needed: +2    Subjective Data      Cognition  Cognition Arousal/Alertness: Awake/alert Behavior During Therapy:  WFL for tasks assessed/performed Overall Cognitive Status: Within Functional Limits for tasks assessed    Balance  Balance Balance Assessed: Yes Static Standing Balance Static Standing - Balance Support: Bilateral upper extremity supported Static Standing - Level of Assistance: 3: Mod assist Static Standing - Comment/# of Minutes: 30 seconds x 2 with RW, pt bracing LLE against chair  End of Session PT - End of Session Equipment Utilized During Treatment: Gait belt Activity Tolerance: Patient tolerated treatment well Patient left: in chair;with call bell/phone within reach   GP     Ilda Foil 08/19/2012, 10:43 AM  Aida Raider, PT  Office # [phone] Pager [phone]

## 2012-08-20 LAB — URINALYSIS, ROUTINE W REFLEX MICROSCOPIC
Nitrite: NEGATIVE
Protein, ur: 100 mg/dL — AB
Urobilinogen, UA: 0.2 mg/dL (ref 0.0–1.0)

## 2012-08-20 LAB — BASIC METABOLIC PANEL
BUN: 26 mg/dL — ABNORMAL HIGH (ref 6–23)
Calcium: 9.3 mg/dL (ref 8.4–10.5)
Creatinine, Ser: 1.06 mg/dL (ref 0.50–1.35)
GFR calc Af Amer: >90 mL/min (ref >90)
GFR calc non Af Amer: 80 mL/min — ABNORMAL LOW (ref >90)

## 2012-08-20 LAB — GLUCOSE, CAPILLARY
Glucose-Capillary: 131 mg/dL — ABNORMAL HIGH (ref 70–99)
Glucose-Capillary: 130 mg/dL — ABNORMAL HIGH (ref 70–99)
Glucose-Capillary: 165 mg/dL — ABNORMAL HIGH (ref 70–99)
Glucose-Capillary: 189 mg/dL — ABNORMAL HIGH (ref 70–99)

## 2012-08-20 LAB — CBC
MCHC: 34.4 g/dL (ref 30.0–36.0)
Platelets: 112 10*3/uL — ABNORMAL LOW (ref 150–400)
RDW: 15.3 % (ref 11.5–15.5)
WBC: 5.8 10*3/uL (ref 4.0–10.5)

## 2012-08-20 LAB — URINE MICROSCOPIC-ADD ON

## 2012-08-20 MED ORDER — INSULIN ASPART 100 UNIT/ML ~~LOC~~ SOLN
0.0000 [IU] | Freq: Three times a day (TID) | SUBCUTANEOUS | Status: DC
  Administered 2012-08-21 (×3): 2 [IU] via SUBCUTANEOUS

## 2012-08-20 MED ORDER — FLUOXETINE HCL 20 MG PO CAPS
20.0000 mg | ORAL_CAPSULE | Freq: Every day | ORAL | Status: DC
  Administered 2012-08-20 – 2012-08-21 (×2): 20 mg via ORAL
  Filled 2012-08-20 (×2): qty 1

## 2012-08-20 MED ORDER — BISACODYL 10 MG RE SUPP: 10.0000 mg | Freq: Once | RECTAL | Status: DC

## 2012-08-20 MED ORDER — HYDRALAZINE HCL 20 MG/ML IJ SOLN
10.0000 mg | Freq: Once | INTRAMUSCULAR | Status: AC
  Administered 2012-08-20: 10 mg via INTRAVENOUS
  Filled 2012-08-20: qty 1

## 2012-08-20 MED ORDER — AMITRIPTYLINE HCL 50 MG PO TABS
50.0000 mg | ORAL_TABLET | Freq: Every day | ORAL | Status: DC
  Filled 2012-08-20: qty 1

## 2012-08-20 NOTE — Progress Notes (Signed)
PT Cancellation Note  Patient Details Name: Lawrence [name] MRN: [id number] DOB: May 07, 1960   Cancelled Treatment:    Reason Eval/Treat Not Completed: Fatigue/lethargy limiting ability to participate.  Pt has refused back to bed x 2 days, so has stayed in bedside recliner 2 consecutive nights getting very little sleep.  He was presenting with confusion.  RN was able to get pt to agree to return to bed this AM and take pain meds.  He is now sleeping heavily.  PT to resume tomorrow.   Ilda Foil 08/20/2012, 10:24 AM  Aida Raider, PT  Office # [phone] Pager [phone]

## 2012-08-20 NOTE — Progress Notes (Addendum)
Subjective: 4 Days Post-Op Procedure(s) (LRB): AMPUTATION BELOW KNEE (Right) Patient reports pain as mild.    Objective: Vital signs in last 24 hours: Temp:  [98.3 F (36.8 [name])-99.2 F (37.3 [name])] 99.2 F (37.3 [name]) (07/13 0748) Pulse Rate:  [71-103] 103 (07/13 1052) Resp:  [16-20] 18 (07/13 0748) BP: (121-180)/(53-109) 161/71 mmHg (07/13 1052) SpO2:  [99 %-100 %] 99 % (07/13 0748)  Intake/Output from previous day: 07/12 0701 - 07/13 0700 In: 3435 [P.O.:1560; I.V.:1875] Out: 3150 [Urine:3150] Intake/Output this shift: Total I/O In: 480 [P.O.:480] Out: -   No results found for this basename: HGB,  in the last 72 hours No results found for this basename: WBC, RBC, HCT, PLT,  in the last 72 hours No results found for this basename: NA, K, CL, CO2, BUN, CREATININE, GLUCOSE, CALCIUM,  in the last 72 hours No results found for this basename: LABPT, INR,  in the last 72 hours  Neurologically intact  Assessment/Plan: 4 Days Post-Op Procedure(s) (LRB): AMPUTATION BELOW KNEE (Right) Plan    SNF,   Leave foley until Monday due to high volume with cath done.   Then I and Cath again if not empying well checking bladder scan after voiding .   Increase amitriptyline to 50 mg po HS  Slightly confused, will recheck BMET and CBC Lawrence [name],Lawrence [name] 08/20/2012, 11:08 AM

## 2012-08-21 ENCOUNTER — Inpatient Hospital Stay (HOSPITAL_COMMUNITY)
Admission: RE | Admit: 2012-08-21 | Discharge: 2012-09-01 | DRG: 945 | Disposition: A | Discharge status: Skilled Nursing Facility | Payer: Self-pay | Source: Intra-hospital | Attending: Physical Medicine & Rehabilitation | Admitting: Physical Medicine & Rehabilitation

## 2012-08-21 DIAGNOSIS — Z794 Long term (current) use of insulin: Secondary | ICD-10-CM

## 2012-08-21 DIAGNOSIS — Z7982 Long term (current) use of aspirin: Secondary | ICD-10-CM

## 2012-08-21 DIAGNOSIS — E876 Hypokalemia: Secondary | ICD-10-CM

## 2012-08-21 DIAGNOSIS — G5691 Unspecified mononeuropathy of right upper limb: Secondary | ICD-10-CM | POA: Diagnosis present

## 2012-08-21 DIAGNOSIS — R5381 Other malaise: Secondary | ICD-10-CM

## 2012-08-21 DIAGNOSIS — IMO0002 Reserved for concepts with insufficient information to code with codable children: Secondary | ICD-10-CM

## 2012-08-21 DIAGNOSIS — F29 Unspecified psychosis not due to a substance or known physiological condition: Secondary | ICD-10-CM

## 2012-08-21 DIAGNOSIS — F329 Major depressive disorder, single episode, unspecified: Secondary | ICD-10-CM

## 2012-08-21 DIAGNOSIS — S88119A Complete traumatic amputation at level between knee and ankle, unspecified lower leg, initial encounter: Secondary | ICD-10-CM

## 2012-08-21 DIAGNOSIS — G547 Phantom limb syndrome without pain: Secondary | ICD-10-CM

## 2012-08-21 DIAGNOSIS — E1165 Type 2 diabetes mellitus with hyperglycemia: Secondary | ICD-10-CM

## 2012-08-21 DIAGNOSIS — E1169 Type 2 diabetes mellitus with other specified complication: Secondary | ICD-10-CM

## 2012-08-21 DIAGNOSIS — K5900 Constipation, unspecified: Secondary | ICD-10-CM

## 2012-08-21 DIAGNOSIS — D638 Anemia in other chronic diseases classified elsewhere: Secondary | ICD-10-CM

## 2012-08-21 DIAGNOSIS — S98139A Complete traumatic amputation of one unspecified lesser toe, initial encounter: Secondary | ICD-10-CM

## 2012-08-21 DIAGNOSIS — Z5189 Encounter for other specified aftercare: Principal | ICD-10-CM

## 2012-08-21 DIAGNOSIS — D62 Acute posthemorrhagic anemia: Secondary | ICD-10-CM

## 2012-08-21 DIAGNOSIS — E7800 Pure hypercholesterolemia, unspecified: Secondary | ICD-10-CM

## 2012-08-21 DIAGNOSIS — F3289 Other specified depressive episodes: Secondary | ICD-10-CM

## 2012-08-21 DIAGNOSIS — Z79899 Other long term (current) drug therapy: Secondary | ICD-10-CM

## 2012-08-21 DIAGNOSIS — G4700 Insomnia, unspecified: Secondary | ICD-10-CM

## 2012-08-21 DIAGNOSIS — I739 Peripheral vascular disease, unspecified: Secondary | ICD-10-CM

## 2012-08-21 DIAGNOSIS — D696 Thrombocytopenia, unspecified: Secondary | ICD-10-CM

## 2012-08-21 DIAGNOSIS — R339 Retention of urine, unspecified: Secondary | ICD-10-CM

## 2012-08-21 DIAGNOSIS — M9080 Osteopathy in diseases classified elsewhere, unspecified site: Secondary | ICD-10-CM

## 2012-08-21 DIAGNOSIS — E119 Type 2 diabetes mellitus without complications: Secondary | ICD-10-CM | POA: Diagnosis present

## 2012-08-21 DIAGNOSIS — F411 Generalized anxiety disorder: Secondary | ICD-10-CM

## 2012-08-21 DIAGNOSIS — L98499 Non-pressure chronic ulcer of skin of other sites with unspecified severity: Secondary | ICD-10-CM

## 2012-08-21 DIAGNOSIS — I129 Hypertensive chronic kidney disease with stage 1 through stage 4 chronic kidney disease, or unspecified chronic kidney disease: Secondary | ICD-10-CM

## 2012-08-21 DIAGNOSIS — M869 Osteomyelitis, unspecified: Secondary | ICD-10-CM

## 2012-08-21 DIAGNOSIS — G589 Mononeuropathy, unspecified: Secondary | ICD-10-CM

## 2012-08-21 DIAGNOSIS — N189 Chronic kidney disease, unspecified: Secondary | ICD-10-CM

## 2012-08-21 DIAGNOSIS — I10 Essential (primary) hypertension: Secondary | ICD-10-CM

## 2012-08-21 LAB — GLUCOSE, CAPILLARY
Glucose-Capillary: 156 mg/dL — ABNORMAL HIGH (ref 70–99)
Glucose-Capillary: 122 mg/dL — ABNORMAL HIGH (ref 70–99)

## 2012-08-21 MED ORDER — ONDANSETRON HCL 4 MG/2ML IJ SOLN: 4.0000 mg | Freq: Four times a day (QID) | INTRAMUSCULAR | Status: DC | PRN

## 2012-08-21 MED ORDER — OXYCODONE-ACETAMINOPHEN 5-325 MG PO TABS
1.0000 | ORAL_TABLET | ORAL | Status: DC | PRN
  Administered 2012-08-22 – 2012-08-24 (×4): 2 via ORAL
  Administered 2012-08-25 – 2012-08-27 (×4): 1 via ORAL
  Administered 2012-08-29 – 2012-09-01 (×5): 2 via ORAL
  Filled 2012-08-21: qty 2
  Filled 2012-08-21: qty 1
  Filled 2012-08-21: qty 2
  Filled 2012-08-21: qty 1
  Filled 2012-08-21 (×2): qty 2
  Filled 2012-08-21: qty 1
  Filled 2012-08-21 (×8): qty 2

## 2012-08-21 MED ORDER — POLYETHYLENE GLYCOL 3350 17 G PO PACK
17.0000 g | PACK | Freq: Every day | ORAL | Status: DC
  Filled 2012-08-21 (×4): qty 1

## 2012-08-21 MED ORDER — TRAMADOL HCL 50 MG PO TABS
50.0000 mg | ORAL_TABLET | Freq: Four times a day (QID) | ORAL | Status: DC | PRN
  Administered 2012-08-23 – 2012-08-28 (×5): 50 mg via ORAL
  Filled 2012-08-21 (×6): qty 1

## 2012-08-21 MED ORDER — METHOCARBAMOL 500 MG PO TABS
500.0000 mg | ORAL_TABLET | Freq: Four times a day (QID) | ORAL | Status: DC | PRN
  Administered 2012-08-21: 500 mg via ORAL
  Filled 2012-08-21: qty 1

## 2012-08-21 MED ORDER — INSULIN ASPART 100 UNIT/ML ~~LOC~~ SOLN
0.0000 [IU] | Freq: Three times a day (TID) | SUBCUTANEOUS | Status: DC
  Administered 2012-08-23: 2 [IU] via SUBCUTANEOUS
  Administered 2012-08-23: 1 [IU] via SUBCUTANEOUS
  Administered 2012-08-24 – 2012-08-26 (×3): 2 [IU] via SUBCUTANEOUS
  Administered 2012-08-26: 1 [IU] via SUBCUTANEOUS
  Administered 2012-08-27: 2 [IU] via SUBCUTANEOUS
  Administered 2012-08-27 (×2): 1 [IU] via SUBCUTANEOUS
  Administered 2012-08-28: 2 [IU] via SUBCUTANEOUS
  Administered 2012-08-28 – 2012-08-30 (×3): 1 [IU] via SUBCUTANEOUS
  Administered 2012-08-30: 2 [IU] via SUBCUTANEOUS
  Administered 2012-08-30: 1 [IU] via SUBCUTANEOUS
  Administered 2012-08-31: 3 [IU] via SUBCUTANEOUS
  Administered 2012-08-31: 2 [IU] via SUBCUTANEOUS

## 2012-08-21 MED ORDER — METOPROLOL TARTRATE 25 MG PO TABS
25.0000 mg | ORAL_TABLET | Freq: Two times a day (BID) | ORAL | Status: DC
Start: 2012-08-21 — End: 2012-09-01
  Administered 2012-08-21 – 2012-09-01 (×22): 25 mg via ORAL
  Filled 2012-08-21 (×24): qty 1

## 2012-08-21 MED ORDER — ALUM & MAG HYDROXIDE-SIMETH 200-200-20 MG/5ML PO SUSP: 30.0000 mL | ORAL | Status: DC | PRN

## 2012-08-21 MED ORDER — TRAZODONE HCL 50 MG PO TABS
25.0000 mg | ORAL_TABLET | Freq: Every day | ORAL | Status: DC
  Administered 2012-08-21 – 2012-08-29 (×9): 25 mg via ORAL
  Filled 2012-08-21 (×8): qty 1

## 2012-08-21 MED ORDER — TAMSULOSIN HCL 0.4 MG PO CAPS
0.4000 mg | ORAL_CAPSULE | Freq: Every day | ORAL | Status: DC
  Administered 2012-08-22 – 2012-08-30 (×9): 0.4 mg via ORAL
  Filled 2012-08-21 (×10): qty 1

## 2012-08-21 MED ORDER — ACETAMINOPHEN 325 MG PO TABS
325.0000 mg | ORAL_TABLET | ORAL | Status: DC | PRN
Start: 2012-08-21 — End: 2012-09-01

## 2012-08-21 MED ORDER — METHOCARBAMOL 100 MG/ML IJ SOLN
500.0000 mg | Freq: Four times a day (QID) | INTRAMUSCULAR | Status: DC | PRN
  Filled 2012-08-21: qty 5

## 2012-08-21 MED ORDER — INSULIN GLARGINE 100 UNIT/ML ~~LOC~~ SOLN
20.0000 [IU] | Freq: Every day | SUBCUTANEOUS | Status: DC
  Administered 2012-08-21 – 2012-08-30 (×10): 20 [IU] via SUBCUTANEOUS
  Filled 2012-08-21 (×11): qty 0.2

## 2012-08-21 MED ORDER — GUAIFENESIN-DM 100-10 MG/5ML PO SYRP: 5.0000 mL | ORAL_SOLUTION | Freq: Four times a day (QID) | ORAL | Status: DC | PRN

## 2012-08-21 MED ORDER — TRAZODONE HCL 50 MG PO TABS
25.0000 mg | ORAL_TABLET | Freq: Every evening | ORAL | Status: DC | PRN
Start: 2012-08-21 — End: 2012-08-30
  Filled 2012-08-21: qty 1

## 2012-08-21 MED ORDER — FLEET ENEMA 7-19 GM/118ML RE ENEM: 1.0000 | ENEMA | Freq: Once | RECTAL | Status: AC | PRN

## 2012-08-21 MED ORDER — ASPIRIN 81 MG PO CHEW
81.0000 mg | CHEWABLE_TABLET | Freq: Every day | ORAL | Status: DC
  Administered 2012-08-22 – 2012-09-01 (×11): 81 mg via ORAL
  Filled 2012-08-21 (×11): qty 1

## 2012-08-21 MED ORDER — ONDANSETRON HCL 4 MG PO TABS
4.0000 mg | ORAL_TABLET | Freq: Four times a day (QID) | ORAL | Status: DC | PRN
  Administered 2012-08-29 – 2012-09-01 (×4): 4 mg via ORAL
  Filled 2012-08-21 (×4): qty 1

## 2012-08-21 MED ORDER — ENOXAPARIN SODIUM 40 MG/0.4ML ~~LOC~~ SOLN
40.0000 mg | SUBCUTANEOUS | Status: DC
  Administered 2012-08-21 – 2012-08-31 (×11): 40 mg via SUBCUTANEOUS
  Filled 2012-08-21 (×12): qty 0.4

## 2012-08-21 NOTE — Progress Notes (Signed)
Patient received from 5N 23 at 1834 alert and oriented x 3 with some confusion. Noted.. No complaint of pain . Right bka with brace and ace wrap intact . Foley catheter intact . LBM  08-15-12. Oriented patient and sister to room and  Call bell system . Patient and sister verbalized understanding of admission process. Continue with plan of care .           Cleotilde Neer

## 2012-08-21 NOTE — Plan of Care (Signed)
Overall Plan of Care [facility]) Patient Details Name: Lawrence [name] MRN: [id number] DOB: 01-21-1961  Diagnosis:  Right BKA2  Co-morbidities: DM2, htn, ACD, constipation  Functional Problem List  Patient demonstrates impairments in the following areas: Balance, Behavior, Bladder, Bowel, Cognition, Edema, Endurance, Medication Management, Nutrition, Pain, Safety and Skin Integrity  Basic ADL's: grooming, bathing, dressing and toileting Advanced ADL's: n/a  Transfers:  bed mobility, bed to chair, toilet, tub/shower, car and furniture Locomotion:  wheelchair mobility  Additional Impairments:  Discharge Disposition  Anticipated Outcomes Item Anticipated Outcome  Eating/Swallowing    Basic self-care  Supervision   Tolieting  Supervision   Bowel/Bladder  Mod I  Transfers  S transfers  Locomotion  S w/c mobility  Communication    Cognition    Pain  Min assist   Safety/Judgment  Mod I  Other     Therapy Plan: PT Intensity: Minimum of 1-2 x/day ,45 to 90 minutes PT Frequency: 5 out of 7 days PT Duration Estimated Length of Stay: 7-10 days OT Intensity: Minimum of 1-2 x/day, 45 to 90 minutes OT Frequency: 5 out of 7 days OT Duration/Estimated Length of Stay: 7-10      Team Interventions: Item RN PT OT SLP SW TR Other  Self Care/Advanced ADL Retraining   x      Neuromuscular Re-Education  x x      Therapeutic Activities  x x   x   UE/LE Strength Training/ROM  x x   x   UE/LE Coordination Activities  x x   x   Visual/Perceptual Remediation/Compensation         DME/Adaptive Equipment Instruction  x x   x   Therapeutic Exercise  x x   x   Balance/Vestibular Training  x x   x   Patient/Family Education x x x   x   Cognitive Remediation/Compensation         Functional Mobility Training  x x   x   Ambulation/Gait Training  x       Engineer, structural Propulsion/Positioning  x x   x   Functional Tourist information centre manager Reintegration  x  x   x   Dysphagia/Aspiration Film/video editor         Bladder Management x        Bowel Management x        Disease Management/Prevention x  x      Pain Management x x x   x   Medication Management x        Skin Care/Wound Management x x x      Splinting/Orthotics x x       Discharge Planning x x x   x   Psychosocial Support x x x   x                          Team Discharge Planning: Destination: PT-Skilled Nursing Facility (SNF) (SNF vs. Home) ,OT- Skilled Nursing Facility (SNF) , SLP-  Projected Follow-up: PT-Skilled nursing facility;Home health PT (SNF? Due to decreased caregiver support/home environment), OT-  Skilled nursing facility, SLP-  Projected Equipment Needs: PT-Wheelchair (measurements);Wheelchair cushion (measurements);Sliding board, OT-  , SLP-  Patient/family involved in discharge planning: PT- Patient,  OT-Patient, SLP-   MD ELOS: 7-10 days Medical Rehab Prognosis:  Excellent Assessment: The patient has been admitted for CIR therapies. The team will be addressing, functional mobility, strength, stamina, balance, safety, adaptive techniques/equipment, self-care, bowel and bladder mgt, patient and caregiver education, pre-prosthetic needs, pain mgt. Goals have been set at supervision to modified independent.Ranelle Oyster, MD, FAAPMR      See Team Conference Notes for weekly updates to the plan of care

## 2012-08-21 NOTE — Progress Notes (Signed)
Subjective: 5 Days Post-Op Procedure(s) (LRB): AMPUTATION BELOW KNEE (Right) Patient reports pain as mild.    Objective: Vital signs in last 24 hours: Temp:  [97.8 F (36.6 C)-98.5 F (36.9 C)] 98.5 F (36.9 C) (07/14 1400) Pulse Rate:  [68-96] 68 (07/14 1400) Resp:  [16] 16 (07/14 1400) BP: (152-166)/(51-71) 152/51 mmHg (07/14 1400) SpO2:  [99 %-100 %] 100 % (07/14 1400) Weight:  [74.3 kg (163 lb 12.8 oz)] 74.3 kg (163 lb 12.8 oz) (07/14 1433)  Intake/Output from previous day: 07/13 0701 - 07/14 0700 In: 1680 [P.O.:1440; I.V.:240] Out: 1550 [Urine:1550] Intake/Output this shift: Total I/O In: -  Out: 1300 [Urine:1300]   Recent Labs  08/20/12 1124  HGB 9.0*    Recent Labs  08/20/12 1124  WBC 5.8  RBC 3.38*  HCT 26.2*  PLT 112*    Recent Labs  08/20/12 1124  NA 138  K 4.1  CL 106  CO2 22  BUN 26*  CREATININE 1.06  GLUCOSE 149*  CALCIUM 9.3   No results found for this basename: LABPT, INR,  in the last 72 hours  Incision: dressing C/D/I  Assessment/Plan: 5 Days Post-Op Procedure(s) (LRB): AMPUTATION BELOW KNEE (Right) Up with therapy Discharge to rehab today. Dressing change may be done at rehab  Lawrence [name] M 08/21/2012, 4:57 PM

## 2012-08-21 NOTE — Progress Notes (Addendum)
Pt agreeable to CIR-bed available-can admit today, if medically ready.  Reviewed w/ pt's SW, Amy.  [phone]

## 2012-08-21 NOTE — PMR Pre-admission (Signed)
PMR Admission Coordinator Pre-Admission Assessment  Patient: Lawrence [name] is an 52 y.o., male MRN: [id number] DOB: [birth year] Height: 5\' 5"  (165.1 cm) Weight: 74.3 kg (163 lb 12.8 oz)              Insurance Information HMO:      PPO:       PCP:    IPA:      80/20:       OTHER: yes PRIMARY: 0          Medicaid Application Date:  On appeal    Disability Application Date:  On appeal     Pt's attorney: Wayne Sever ph# [phone]  Emergency Contact Information Contact Information   Name Relation Home Work Mobile   [place] Sister [phone]  [phone]     Current Medical History  Patient Admitting Diagnosis:  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   History of Present Illness:  52 y.o. male with history of DM, CKD, nonhealing ulcer right foot with osteomyelitis requiring Ray 05/03/12. Patient with wound dehiscence, poor healing despite VAC and significant small vessel disease. He was admitted on 08/16/12 for R-BKA by Dr. Ophelia Charter. Post op with difficulty voiding requiring in and out caths. He is limited by pain, anxiety and deconditioning. MD and therapy recommending CIR.  08/21/12 Pt w/ foley d/t retention; no BM since admitted to hospital; & requiring 2+A in txs.   Needs decrease in burden of care.        Past Medical History  Past Medical History  Diagnosis Date  . Hypertension   . Chronic kidney disease   . Hypercholesteremia   . Diabetes mellitus     insulin dependent  . Depression   . PONV (postoperative nausea and vomiting)     Family History  family history is not on file.  Prior Rehab/Hospitalizations: no   Current Medications  Current facility-administered medications:0.9 %  sodium chloride infusion, , Intravenous, Continuous, Eldred Manges, MD, Last Rate: 75 mL/hr at 08/20/12 0535;  aspirin chewable tablet 81 mg, 81 mg, Oral, Daily, Eldred Manges, MD, 81 mg at 08/21/12 1006;  bisacodyl (DULCOLAX) suppository 10 mg, 10 mg, Rectal, Once, Eldred Manges, MD;  enoxaparin (LOVENOX) injection 30 mg, 30 mg, Subcutaneous, Q24H, Eldred Manges, MD, 30 mg at 08/21/12 1006 FLUoxetine (PROZAC) capsule 20 mg, 20 mg, Oral, Daily, Eldred Manges, MD, 20 mg at 08/21/12 1007;  HYDROcodone-acetaminophen (NORCO/VICODIN) 5-325 MG per tablet 1-2 tablet, 1-2 tablet, Oral, Q4H PRN, Eldred Manges, MD, 2 tablet at 08/18/12 2354;  insulin aspart (novoLOG) injection 0-9 Units, 0-9 Units, Subcutaneous, TID WC, Eldred Manges, MD, 2 Units at 08/21/12 1344 insulin glargine (LANTUS) injection 20 Units, 20 Units, Subcutaneous, QHS, Eldred Manges, MD, 20 Units at 08/20/12 2118;  lactated ringers infusion, , Intravenous, Continuous, Kipp Brood, MD, Last Rate: 50 mL/hr at 08/16/12 1237;  methocarbamol (ROBAXIN) 500 mg in dextrose 5 % 50 mL IVPB, 500 mg, Intravenous, Q6H PRN, Eldred Manges, MD;  methocarbamol (ROBAXIN) tablet 500 mg, 500 mg, Oral, Q6H PRN, Eldred Manges, MD, 500 mg at 08/20/12 2117 metoCLOPramide (REGLAN) injection 5-10 mg, 5-10 mg, Intravenous, Q8H PRN, Eldred Manges, MD;  metoCLOPramide (REGLAN) tablet 5-10 mg, 5-10 mg, Oral, Q8H PRN, Eldred Manges, MD;  metoprolol tartrate (LOPRESSOR) tablet 25 mg, 25 mg, Oral, BID, Eldred Manges, MD, 25 mg at 08/21/12 1007;  ondansetron (ZOFRAN) injection 4 mg, 4 mg, Intravenous, Q6H PRN, Eldred Manges,  MD;  ondansetron (ZOFRAN) tablet 4 mg, 4 mg, Oral, Q6H PRN, Eldred Manges, MD oxyCODONE-acetaminophen (PERCOCET/ROXICET) 5-325 MG per tablet 1-2 tablet, 1-2 tablet, Oral, Q4H PRN, Eldred Manges, MD, 2 tablet at 08/20/12 2117;  tamsulosin [facility]) capsule 0.4 mg, 0.4 mg, Oral, Daily, Wende Neighbors, PA-C, 0.4 mg at 08/21/12 1007  Patients Current Diet: Carb Control  Precautions / Restrictions Precautions Precautions: Fall Restrictions Weight Bearing Restrictions: Yes RLE Weight Bearing: Non weight bearing   Prior Activity Level  Mod I RW ambulation Home Assistive Devices / Equipment  CBG machine, RW  Prior Functional Level Prior  Function Level of Independence: Independent with assistive device(s) Comments: using RW to get around; not driving; motorized cart for shopping;   Current Functional Level Cognition  Overall Cognitive Status: Within Functional Limits for tasks assessed Orientation Level: Oriented to person;Oriented to situation;Disoriented to time;Disoriented to place    Extremity Assessment (includes Sensation/Coordination)  Upper Extremity Assessment: RUE deficits/detail;LUE deficits/detail RUE Deficits / Details: generalized weakness secondary to sedentary PTA, triceps grossly 3+/5 LUE Deficits / Details: generalized weakness secondary to sedentary PTA, triceps grossly 3+/5  Lower Extremity Assessment:  (per PT eval) RLE Deficits / Details: s/p BKA; limited hip internal rotation (able to achieve neutral after stretches); knee extension 0; knee flexion 80 LLE Deficits / Details: ROM: holds leg in external rotation with AAROM to neutral rotation after stretching;; knee WFL; ankle WFL; overall weak with grossly 3+/5 LLE Sensation: history of peripheral neuropathy    ADLs  Grooming: Simulated;Set up Where Assessed - Grooming: Supported sitting Upper Body Bathing: Simulated;Set up Where Assessed - Upper Body Bathing: Supported sitting Lower Body Bathing: Simulated;+1 Total assistance Where Assessed - Lower Body Bathing: Supported sitting;Lean right and/or left Upper Body Dressing: Simulated;Minimal assistance Where Assessed - Upper Body Dressing: Supported sitting Lower Body Dressing: Simulated;+1 Total assistance Where Assessed - Lower Body Dressing: Supported sitting;Lean right and/or left Transfers/Ambulation Related to ADLs: patient declined to attempt stand, "I finally got comfortable" ADL Comments: Patient's sister concerned that patient has been "babied" all of his life that even though he says he wants to be able to go home, she is concerned that he might not be motivated to work as hard as he  will need to to be able to go home and be ready for prosthetic when Dr. Ophelia Charter says he will.    Mobility  Bed Mobility: Not assessed    Transfers  Transfers: Squat Pivot Transfers;Lateral/Scoot Transfers Sit to Stand: 1: +2 Total assist;From chair/3-in-1;With armrests Sit to Stand: Patient Percentage: 40% Squat Pivot Transfers: 1: +2 Total assist Squat Pivot Transfers: Patient Percentage: 40% Lateral/Scoot Transfers: 4: Min assist;With armrests removed;From elevated surface;With slide board Transfer via Lift Equipment: Antony Salmon (attempted, not yet able to stand)    Ambulation / Gait / Stairs / Psychologist, prison and probation services  Ambulation/Gait Ambulation/Gait Assistance: Not tested (comment)    Posture / Balance Static Sitting Balance Static Sitting - Balance Support: Bilateral upper extremity supported;Feet unsupported Static Sitting - Level of Assistance: 7: Independent Dynamic Sitting Balance Dynamic Sitting - Balance Support: Right upper extremity supported;Feet unsupported Dynamic Sitting - Level of Assistance: 5: Stand by assistance Static Standing Balance Static Standing - Balance Support: Bilateral upper extremity supported Static Standing - Level of Assistance: 3: Mod assist Static Standing - Comment/# of Minutes: 30 seconds x 2 with RW, pt bracing LLE against chair    Special needs/care consideration  Skin: incision R. BKA Bowel mgmt: LBM PTA Bladder mgmt: foley d/t urine  retention Diabetic mgmt yes     Previous Home Environment Living Arrangements: Lives w/ mentally challenged uncle-currently a cousin is w/ this uncle. Uncle goes via [facility] M-F to [facility].  Pt provides S for Uncle.  Additional Comments: has been walking with RW since toe amputation 3 weeks ago; was indpendent without AD prior to surgery.  Sister would like for CIR to evaluate for CIR  Discharge Living Setting Plans for Discharge Living Setting: most likely will  need to d/c SNF Type of Home at Discharge:  House Discharge Home Layout: One level Discharge Home Access: Level entry (pt & sister say home is not w/c accessible) Discharge Bathroom Shower/Tub: Tub/shower unit Discharge Bathroom Toilet: Standard Discharge Bathroom Accessibility: No Does the patient have any problems obtaining your medications?: Yes (Describe) (sometimes can't afford)  Social/Family/Support Systems Patient Roles: Other (Comment) (lives w/ & provides S for Uncle) Contact Information: [phone] Anticipated Caregiver: no caregiver-sister=contact Anticipated Caregiver's Contact Information: sister's h [phone] cell [phone] Caregiver Availability: Other (Comment) (sister is contact person, but can't assist-she can check on him prn) Discharge Plan Discussed with Primary Caregiver: Yes (discussed w/ sister & pt about SNF as probable d/c plan) Is Caregiver In Agreement with Plan?: Yes Does Caregiver/Family have Issues with Lodging/Transportation while Pt is in Rehab?: No    Goals/Additional Needs Patient/Family Goal for Rehab: Hope for d/c home, but, pt & sister state understanding that d/c SNF probable & will be wherever he gets bed offer.   Expected length of stay: 10-12 days Pt/Family Agrees to Admission and willing to participate: Yes (agree to admission-no caregiver available) Program Orientation Provided & Reviewed with Pt/Caregiver Including Roles  & Responsibilities: Yes   Decrease burden of Care through IP rehab admission: Specialzed equipment needs, Decrease number of caregivers, Bowel and bladder program and Patient/family education   Possible need for SNF placement upon discharge:  yes   Patient Condition: This patient's medical and functional status has changed since the consult dated: 08/18/12 in which the Rehabilitation Physician determined and documented that the patient's condition is appropriate for intensive rehabilitative care in an inpatient rehabilitation facility. See "History of Present  Illness" (above) for medical update. Functional changes are: pt 2+ A, pt 40%. Patient's medical and functional status update has been discussed with the Rehabilitation physician and patient remains appropriate for inpatient rehabilitation. Will admit to inpatient rehab today.  Preadmission Screen Completed By:  Brock Ra, 08/21/2012 3:00 PM ______________________________________________________________________   Discussed status with Dr. Riley Kill on 08/21/12 at 3:00pm and received telephone approval for admission today.  Admission Coordinator:  Brock Ra, time 3:00pm/Date 08/21/12

## 2012-08-21 NOTE — Progress Notes (Signed)
Pt is potential admit to CIR.  Will meet w/ pt this morning to discuss.  Admit depends on his willingness to participate, bed availability CIR & pt's medical readiness.  [phone]

## 2012-08-21 NOTE — H&P (Signed)
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 male with history of DM, CKD, nonhealing ulcer right foot with osteomyelitis requiring Ray 05/03/12. Patient with wound dehiscence, poor healing despite VAC and significant small vessel disease. He was admitted on 08/16/12 for R-BKA by Dr. Ophelia Charter. Post op with difficulty voiding requiring in and out caths. He is limited by pain, anxiety and deconditioning. Pain control improving but some confusion reporte. Foley replaced due to retention. MD and therapy recommending CIR. Pt was ultimately admitted to inpatient rehab today.    Review of Systems  HENT: Negative for hearing loss and neck pain.  Eyes: Negative for blurred vision and double vision.  Respiratory: Negative for cough and shortness of breath.  Cardiovascular: Negative for chest pain and palpitations.  Gastrointestinal: Positive for constipation (No Bm since surgery).  Musculoskeletal: Positive for joint pain. Negative for myalgias.  Neurological: Positive for weakness. Negative for focal weakness and headaches.  Psychiatric/Behavioral: The patient is nervous/anxious and has insomnia.   Past Medical History   Diagnosis  Date   .  Hypertension    .  Chronic kidney disease    .  Hypercholesteremia    .  Diabetes mellitus      insulin dependent   .  Depression    .  PONV (postoperative nausea and vomiting)     Past Surgical History   Procedure  Laterality  Date   .  Amputation  Right  05/03/2012     Procedure: Right First Ray AMPUTATION; Surgeon: Eldred Manges, MD; Location: [facility] OR; Service: Orthopedics; Laterality: Right;   .  I&d extremity  Right  05/29/2012     Procedure: IRRIGATION AND DEBRIDEMENT EXTREMITY- right foot; Surgeon: Eldred Manges, MD; Location: [facility] OR; Service: Orthopedics; Laterality: Right; Right Foot Debridement, VAC Application   .  Toe amputation  Right  05/29/2012     1ST ray Dr Ophelia Charter   .   Amputation  Right  08/16/2012     Procedure: AMPUTATION BELOW KNEE; Surgeon: Eldred Manges, MD; Location: [facility] OR; Service: Orthopedics; Laterality: Right; Right Below Knee Amputation    History reviewed. No pertinent family history.  Social History: Single. 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 Has required RW past month. Live with Mentally challenged uncle who can help with housework/meal prep past discharge. Sister unable to provide assist. He reports that he has never smoked. He has never used smokeless tobacco. He reports that drinks alcohol. He reports that he does not use illicit drugs.    Allergies: No Known Allergies  Medications Prior to Admission   Medication  Sig  Dispense  Refill   .  aspirin 81 MG tablet  Take 81 mg by mouth every morning.     Marland Kitchen  doxycycline (VIBRA-TABS) 100 MG tablet  Take 50 mg by mouth 2 (two) times daily.     Marland Kitchen  HYDROcodone-acetaminophen (NORCO/VICODIN) 5-325 MG per tablet  Take 1-2 tablets by mouth every 4 (four) hours as needed. For pain     .  insulin glargine (LANTUS) 100 UNIT/ML injection  Inject 20 Units into the skin at bedtime.  10 mL  6   .  metoprolol tartrate (LOPRESSOR) 25 MG tablet  Take 1 tablet (25 mg total) by mouth 2 (two) times daily.  180 tablet  3    Home:  Home Living  Family/patient expects to be discharged to:: Skilled nursing  facility  Living Arrangements: Other relatives (mentally challenged uncle)  Additional Comments: has been walking with RW since toe amputation 3 weeks ago; was indpendent without AD prior to surgery. Sister would like for CIR to evaluate for CIR  Functional History:  Prior Function  Comments: using RW to get around; not driving; motorized cart for shopping;  Functional Status:  Mobility:  Bed Mobility  Bed Mobility: Not assessed  Transfers  Transfers: Squat Pivot Transfers;Lateral/Scoot Transfers  Sit to Stand: 1: +2 Total assist;From chair/3-in-1;With armrests  Sit to Stand: Patient Percentage: 40%   Squat Pivot Transfers: 1: +2 Total assist  Squat Pivot Transfers: Patient Percentage: 40%  Lateral/Scoot Transfers: 4: Min assist;With armrests removed;From elevated surface;With slide board  Transfer via Lift Equipment: Antony Salmon (attempted, not yet able to stand)  Ambulation/Gait  Ambulation/Gait Assistance: Not tested (comment)   ADL:  ADL  Grooming: Simulated;Set up  Where Assessed - Grooming: Supported sitting  Upper Body Bathing: Simulated;Set up  Where Assessed - Upper Body Bathing: Supported sitting  Lower Body Bathing: Simulated;+1 Total assistance  Where Assessed - Lower Body Bathing: Supported sitting;Lean right and/or left  Upper Body Dressing: Simulated;Minimal assistance  Where Assessed - Upper Body Dressing: Supported sitting  Lower Body Dressing: Simulated;+1 Total assistance  Where Assessed - Lower Body Dressing: Supported sitting;Lean right and/or left  Transfers/Ambulation Related to ADLs: patient declined to attempt stand, "I finally got comfortable"  ADL Comments: Patient's sister concerned that patient has been "babied" all of his life that even though he says he wants to be able to go home, she is concerned that he might not be motivated to work as hard as he will need to to be able to go home and be ready for prosthetic when Dr. Ophelia Charter says he will.  Cognition:  Cognition  Overall Cognitive Status: Within Functional Limits for tasks assessed  Orientation Level: Oriented to person;Oriented to situation;Disoriented to time;Disoriented to place  Cognition  Arousal/Alertness: Awake/alert  Behavior During Therapy: WFL for tasks assessed/performed  Overall Cognitive Status: Within Functional Limits for tasks assessed    Physical Exam:  Blood pressure 160/60, pulse 77, temperature 97.8 F (36.6 C), temperature source Oral, resp. rate 16, SpO2 100.00%.  Nursing note and vitals reviewed.  Constitutional: He is oriented to person, place, and time. He appears  well-nourished. Mild disorientation noted.  HENT:  Head: Normocephalic and atraumatic.  Eyes: Pupils are equal, round, and reactive to light.  Neck: Normal range of motion. Neck supple.  Cardiovascular: Normal rate and regular rhythm.  No murmur heard.  Pulmonary/Chest: Effort normal and breath sounds normal. No respiratory distress. He has no wheezes.  Abdominal: Soft. Bowel sounds are normal. He exhibits no distension. There is no tenderness.  Musculoskeletal: He exhibits edema.  BUE with muscle wasting distally. Left foot with 2+edema to knees. R-BKA with padded surgical dressing which was loosening.  Neurological: He is alert and oriented to person, place, and time. Intact basic insight and awareness.  Skin: Skin is warm and dry.  Psychiatric: His speech is normal. His affect is blunt. He is withdrawn.  motor strength is 4-5/5 in bilateral deltoid, bicep, tricep, grip. 3+ to 4/5 with Left, hip flexors, knee extensors, ankle dorsi flexion plantarflexion  R LE with BKA has 3-/5 HF  Sensation reduced to sharp touch in both hands and left foot    Results for orders placed during the hospital encounter of 08/16/12 (from the past 48 hour(s))   GLUCOSE, CAPILLARY Status: Abnormal  Collection Time    08/19/12 4:14 PM   Result  Value  Range    Glucose-Capillary  115 (*)  70 - 99 mg/dL    Comment 1  Notify RN     Comment 2  Documented in Chart    GLUCOSE, CAPILLARY Status: Abnormal    Collection Time    08/19/12 9:12 PM   Result  Value  Range    Glucose-Capillary  143 (*)  70 - 99 mg/dL    Comment 1  Documented in Chart     Comment 2  Notify RN    GLUCOSE, CAPILLARY Status: Abnormal    Collection Time    08/20/12 7:08 AM   Result  Value  Range    Glucose-Capillary  131 (*)  70 - 99 mg/dL    Comment 1  Documented in Chart     Comment 2  Notify RN    GLUCOSE, CAPILLARY Status: Abnormal    Collection Time    08/20/12 11:17 AM   Result  Value  Range    Glucose-Capillary  130 (*)   70 - 99 mg/dL    Comment 1  Notify RN     Comment 2  Documented in Chart    BASIC METABOLIC PANEL Status: Abnormal    Collection Time    08/20/12 11:24 AM   Result  Value  Range    Sodium  138  135 - 145 mEq/L    Potassium  4.1  3.5 - 5.1 mEq/L    Chloride  106  96 - 112 mEq/L    CO2  22  19 - 32 mEq/L    Glucose, Bld  149 (*)  70 - 99 mg/dL    BUN  26 (*)  6 - 23 mg/dL    Creatinine, Ser  1.61  0.50 - 1.35 mg/dL    Calcium  9.3  8.4 - 10.5 mg/dL    GFR calc non Af Amer  80 (*)  >90 mL/min    GFR calc Af Amer  >90  >90 mL/min    Comment:      The eGFR has been calculated     using the CKD EPI equation.     This calculation has not been     validated in all clinical     situations.     eGFR's persistently     <90 mL/min signify     possible Chronic Kidney Disease.   CBC Status: Abnormal    Collection Time    08/20/12 11:24 AM   Result  Value  Range    WBC  5.8  4.0 - 10.5 K/uL    RBC  3.38 (*)  4.22 - 5.81 MIL/uL    Hemoglobin  9.0 (*)  13.0 - 17.0 g/dL    HCT  09.6 (*)  04.5 - 52.0 %    MCV  77.5 (*)  78.0 - 100.0 fL    MCH  26.6  26.0 - 34.0 pg    MCHC  34.4  30.0 - 36.0 g/dL    RDW  40.9  81.1 - 91.4 %    Platelets  112 (*)  150 - 400 K/uL   URINALYSIS, ROUTINE W REFLEX MICROSCOPIC Status: Abnormal    Collection Time    08/20/12 11:37 AM   Result  Value  Range    Color, Urine  YELLOW  YELLOW    APPearance  CLEAR  CLEAR    Specific Gravity, Urine  1.012  1.005 - 1.030    pH  5.0  5.0 - 8.0    Glucose, UA  100 (*)  NEGATIVE mg/dL    Hgb urine dipstick  MODERATE (*)  NEGATIVE    Bilirubin Urine  NEGATIVE  NEGATIVE    Ketones, ur  NEGATIVE  NEGATIVE mg/dL    Protein, ur  604 (*)  NEGATIVE mg/dL    Urobilinogen, UA  0.2  0.0 - 1.0 mg/dL    Nitrite  NEGATIVE  NEGATIVE    Leukocytes, UA  SMALL (*)  NEGATIVE   URINE MICROSCOPIC-ADD ON Status: None    Collection Time    08/20/12 11:37 AM   Result  Value  Range    Squamous Epithelial / LPF  RARE  RARE    WBC, UA   3-6  <3 WBC/hpf    RBC / HPF  3-6  <3 RBC/hpf    Urine-Other  FEW YEAST    GLUCOSE, CAPILLARY Status: Abnormal    Collection Time    08/20/12 4:10 PM   Result  Value  Range    Glucose-Capillary  165 (*)  70 - 99 mg/dL    Comment 1  Notify RN     Comment 2  Documented in Chart    GLUCOSE, CAPILLARY Status: Abnormal    Collection Time    08/20/12 8:59 PM   Result  Value  Range    Glucose-Capillary  189 (*)  70 - 99 mg/dL    No results found.  Post Admission Physician Evaluation:  1. Functional deficits secondary to right BKA. 2. Patient is admitted to receive collaborative, interdisciplinary care between the physiatrist, rehab nursing staff, and therapy team. 3. Patient's level of medical complexity and substantial therapy needs in context of that medical necessity cannot be provided at a lesser intensity of care such as a SNF. 4. Patient has experienced substantial functional loss from his/her baseline which was documented above under the "Functional History" and "Functional Status" headings. Judging by the patient's diagnosis, physical exam, and functional history, the patient has potential for functional progress which will result in measurable gains while on inpatient rehab. These gains will be of substantial and practical use upon discharge in facilitating mobility and self-care at the household level. 5. Physiatrist will provide 24 hour management of medical needs as well as oversight of the therapy plan/treatment and provide guidance as appropriate regarding the interaction of the two. 6. 24 hour rehab nursing will assist with bladder management, bowel management, safety, skin/wound care, disease management, medication administration, pain management and patient education and help integrate therapy concepts, techniques,education, etc. 7. PT will assess and treat for/with: Lower extremity strength, range of motion, stamina, balance, functional mobility, safety, adaptive techniques and  equipment, pain mgt, pre-prosthetic mgt. Goals are: supervision to minimal assist. 8. OT will assess and treat for/with: ADL's, functional mobility, safety, upper extremity strength, adaptive techniques and equipment, pain mgt. Goals are: supervision to minimal assist. 9. SLP will assess and treat for/with: n/a. Goals are: n/a. 10. Case Management and Social Worker will assess and treat for psychological issues and discharge planning. 11. Team conference will be held weekly to assess progress toward goals and to determine barriers to discharge. 12. Patient will receive at least 3 hours of therapy per day at least 5 days per week. 13. ELOS: 2 weeks  14. Prognosis: excellent   Medical Problem List and Plan:  1. DVT Prophylaxis/Anticoagulation: Pharmaceutical: Lovenox  2. Pain Management: Continue prn oxycodone.  3. Mood:  Noted to have flat affect. Will monitor for now. Provide ego support. LCSW to follow for evaluation.  4. Neuropsych: This patient is capable of making decisions on his own behalf.  5. DM type 2: Will monitor BS with ac/hs cbg checks. Continue Lantus 20 units and use SSI for elevated BS.  6. Anemia of chronic illness: Recheck in am.  7. HTN: Monitor with bid checks. BP poorly controlled.  8. Urinary retention: Continue Flomax. UA revealed a few yeast..  9. Constipation: Will schedule miralax. Refused suppository.  10. Confusion: New in past 48 hours--reports insomnia. Will check UCS. Schedule trazodone.           Ranelle Oyster, MD, [facility] Physical Medicine & Rehabilitation   08/21/2012

## 2012-08-22 ENCOUNTER — Inpatient Hospital Stay (HOSPITAL_COMMUNITY): Payer: Self-pay | Admitting: *Deleted

## 2012-08-22 ENCOUNTER — Inpatient Hospital Stay (HOSPITAL_COMMUNITY): Payer: Self-pay

## 2012-08-22 DIAGNOSIS — I739 Peripheral vascular disease, unspecified: Secondary | ICD-10-CM

## 2012-08-22 DIAGNOSIS — D62 Acute posthemorrhagic anemia: Secondary | ICD-10-CM

## 2012-08-22 DIAGNOSIS — E1165 Type 2 diabetes mellitus with hyperglycemia: Secondary | ICD-10-CM

## 2012-08-22 DIAGNOSIS — S88119A Complete traumatic amputation at level between knee and ankle, unspecified lower leg, initial encounter: Secondary | ICD-10-CM

## 2012-08-22 DIAGNOSIS — L98499 Non-pressure chronic ulcer of skin of other sites with unspecified severity: Secondary | ICD-10-CM

## 2012-08-22 DIAGNOSIS — I10 Essential (primary) hypertension: Secondary | ICD-10-CM

## 2012-08-22 LAB — CBC WITH DIFFERENTIAL/PLATELET
Eosinophils Relative: 4 % (ref 0–5)
HCT: 25.2 % — ABNORMAL LOW (ref 39.0–52.0)
Hemoglobin: 8.6 g/dL — ABNORMAL LOW (ref 13.0–17.0)
Lymphocytes Relative: 16 % (ref 12–46)
Lymphs Abs: 0.8 10*3/uL (ref 0.7–4.0)
MCV: 77.1 fL — ABNORMAL LOW (ref 78.0–100.0)
Monocytes Absolute: 0.6 10*3/uL (ref 0.1–1.0)
Neutro Abs: 3.6 10*3/uL (ref 1.7–7.7)
RBC: 3.27 MIL/uL — ABNORMAL LOW (ref 4.22–5.81)
WBC: 5.2 10*3/uL (ref 4.0–10.5)

## 2012-08-22 LAB — COMPREHENSIVE METABOLIC PANEL
ALT: 8 U/L (ref 0–53)
CO2: 23 mEq/L (ref 19–32)
Calcium: 9.5 mg/dL (ref 8.4–10.5)
Chloride: 106 mEq/L (ref 96–112)
Creatinine, Ser: 0.99 mg/dL (ref 0.50–1.35)
GFR calc Af Amer: >90 mL/min (ref >90)
GFR calc non Af Amer: >90 mL/min (ref >90)
Glucose, Bld: 96 mg/dL (ref 70–99)
Sodium: 139 mEq/L (ref 135–145)
Total Bilirubin: 0.2 mg/dL — ABNORMAL LOW (ref 0.3–1.2)

## 2012-08-22 LAB — GLUCOSE, CAPILLARY
Glucose-Capillary: 102 mg/dL — ABNORMAL HIGH (ref 70–99)
Glucose-Capillary: 117 mg/dL — ABNORMAL HIGH (ref 70–99)
Glucose-Capillary: 119 mg/dL — ABNORMAL HIGH (ref 70–99)
Glucose-Capillary: 124 mg/dL — ABNORMAL HIGH (ref 70–99)

## 2012-08-22 NOTE — Progress Notes (Signed)
Patient information reviewed and entered into eRehab system by Presleigh Feldstein, RN, CRRN, PPS Coordinator.  Information including medical coding and functional independence measure will be reviewed and updated through discharge.    

## 2012-08-22 NOTE — Care Management Note (Signed)
[facility]patient Rehabilitation [facility] Individual Statement of Services  Patient Name:  Lawrence [name]  Date:  08/22/2012  Welcome to the Inpatient Rehabilitation [facility].  Our goal is to provide you with an individualized program based on your diagnosis and situation, designed to meet your specific needs.  With this comprehensive rehabilitation program, you will be expected to participate in at least 3 hours of rehabilitation therapies Monday-Friday, with modified therapy programming on the weekends.  Your rehabilitation program will include the following services:  Physical Therapy (PT), Occupational Therapy (OT), 24 hour per day rehabilitation nursing, Neuropsychology, Case Management ( Social Worker), Rehabilitation Medicine, Nutrition Services and Pharmacy Services  Weekly team conferences will be held on Wednesday to discuss your progress.  Your Social Worker will talk with you frequently to get your input and to update you on team discussions.  Team conferences with you and your family in attendance may also be held.  Expected length of stay: 7-10 days Overall anticipated outcome: supervision set up w/c level  Depending on your progress and recovery, your program may change. Your Social Worker will coordinate services and will keep you informed of any changes. Your Child psychotherapist names and contact numbers are listed  below.  The following services may also be recommended but are not provided by the Inpatient Rehabilitation Center:   Driving Evaluations  Home Health Rehabiltiation Services  Outpatient Rehabilitatation [facility]  Vocational Rehabilitation   Arrangements will be made to provide these services after discharge if needed.  Arrangements include referral to agencies that provide these services.  Your insurance has been verified to be:  Pending Medicaid Your primary doctor is:  [facility]-Dr [name]  Pertinent information will be shared with your doctor and your  insurance company.  Social Worker:  Dossie Der, [place] [phone]  Information discussed with and copy given to patient by: Lucy Chris, 08/22/2012, 9:57 AM

## 2012-08-22 NOTE — Progress Notes (Signed)
Physical Therapy Session Note  Patient Details  Name: Lawrence [name] MRN: [id number] Date of Birth: [birth year]  Today's Date: 08/22/2012 Time: 1430-1510 Time Calculation (min): 40 min  Short Term Goals: Week 1:  PT Short Term Goal 1 (Week 1): Patient will perform bed mobility with min assist. PT Short Term Goal 2 (Week 1): Patient will perform lateral/scoot or slide board transfers with min assist. PT Short Term Goal 3 (Week 1): Patient will propel wheelchair 150' with supervision.  Skilled Therapeutic Interventions/Progress Updates:    Patient received sitting in wheelchair. Session focused on wheelchair mobility and functional transfers (lateral/scoot transfer with and without slide board and squat pivot). See details below for wheelchair mobility.  Patient performed slide board transfer (going to his L) wheelchair>mat with min assist and assist for set up required. Sitting edge of mat, patient performed x20 R knee extension. Education/discussion about importance of R limb positioning, both in bed and in wheelchair. Emphasis on hip internal rotation and knee extension as patient tends to maintain limb in hip external rotation and slight flexion.  Patient performed lateral/scoot transfer (going to his R) secondary to refusing use of slide board again. Patient requires modA mat>wheelchair. In room, patient performs squat pivot transfer wheelchair>recliner (going to his L) and requires modA. Patient left seated in recliner with all needs within reach.  Therapy Documentation Precautions:  Precautions Precautions: Fall Restrictions Weight Bearing Restrictions: Yes RLE Weight Bearing: Non weight bearing Pain: Pain Assessment Pain Assessment: No/denies pain Pain Score: 0-No pain Pain Intervention(s): Repositioned;Ambulation/increased activity Mobility: Transfers Squat Pivot Transfers: 3: Mod assist;With upper extremity assistance;From elevated surface Squat Pivot Transfer Details:  Verbal cues for sequencing;Verbal cues for technique;Tactile cues for sequencing;Verbal cues for precautions/safety;Manual facilitation for weight shifting Lateral/Scoot Transfers: 3: Mod assist;With armrests removed;From elevated surface;Other (comment);With slide board (performed with and without slide board) Lateral/Scoot Transfer Details: Verbal cues for sequencing;Manual facilitation for weight shifting;Tactile cues for sequencing;Verbal cues for precautions/safety;Verbal cues for technique Locomotion : Corporate treasurer: Yes Wheelchair Assistance: 5: Supervision Wheelchair Assistance Details: Verbal cues for precautions/safety;Verbal cues for Diplomatic Services operational officer: Both upper extremities Wheelchair Parts Management: Needs assistance Distance: 100   See FIM for current functional status  Therapy/Group: Individual Therapy  Chipper Herb. Evon Dejarnett, PT, DPT  08/22/2012, 3:56 PM

## 2012-08-22 NOTE — Evaluation (Signed)
Physical Therapy Assessment and Plan  Patient Details  Name: Lawrence [name] MRN: [id number] Date of Birth: September 03, 1960  PT Diagnosis: Difficulty walking and Muscle weakness Rehab Potential: Good ELOS: 7-10 days   Today's Date: 08/22/2012 Time: 7829-5621 Time Calculation (min): 60 min  Problem List:  Patient Active Problem List   Diagnosis Date Noted  . Unilateral complete BKA--right 08/22/2012  . Urinary retention 08/19/2012  . Osteomyelitis of right foot 08/17/2012    Class: Diagnosis of  . ARF (acute renal failure) 06/03/2012  . Cellulitis 06/03/2012  . Post-operative wound abscess 05/29/2012  . Gangrene of toe 05/02/2012  . Chronic cough 02/22/2012  . Hematuria 03/23/2011  . Prostate asymmetry 03/23/2011  . Colon cancer screening 02/19/2011  . Neuropathy 08/25/2010  . Chronic kidney disease (CKD), stage 4 03/14/2009  . Accelerated hypertension 06/18/2008  . DM (diabetes mellitus), type 2, uncontrolled 12/26/2007  . HYPERCHOLESTEROLEMIA 12/26/2007    Past Medical History:  Past Medical History  Diagnosis Date  . Hypertension   . Chronic kidney disease   . Hypercholesteremia   . Diabetes mellitus     insulin dependent  . Depression   . PONV (postoperative nausea and vomiting)    Past Surgical History:  Past Surgical History  Procedure Laterality Date  . Amputation Right 05/03/2012    Procedure: Right First Ray AMPUTATION;  Surgeon: Eldred Manges, MD;  Location: [facility] OR;  Service: Orthopedics;  Laterality: Right;  . I&d extremity Right 05/29/2012    Procedure: IRRIGATION AND DEBRIDEMENT EXTREMITY- right foot;  Surgeon: Eldred Manges, MD;  Location: [facility] OR;  Service: Orthopedics;  Laterality: Right;  Right Foot Debridement, VAC Application  . Toe amputation Right 05/29/2012    1ST  ray      Dr Ophelia Charter  . Amputation Right 08/16/2012    Procedure: AMPUTATION BELOW KNEE;  Surgeon: Eldred Manges, MD;  Location: [facility] OR;  Service: Orthopedics;  Laterality: Right;  Right Below Knee  Amputation    Assessment & Plan Clinical Impression: Lawrence [name] is a 52 y.o. male with history of DM, CKD, nonhealing ulcer right foot with osteomyelitis requiring Ray 05/03/12. Patient with wound dehiscence, poor healing despite VAC and significant small vessel disease. He was admitted on 08/16/12 for R-BKA by Dr. Ophelia Charter. Post op with difficulty voiding requiring in and out caths. He is limited by pain, anxiety and deconditioning. Pain control improving but some confusion reporte. Foley replaced due to retention. MD and therapy recommending CIR. Patient transferred to CIR on 08/21/2012 .   Patient currently requires mod with mobility secondary to muscle weakness and decreased standing balance and decreased balance strategies.  Prior to hospitalization, patient was modified independent  with mobility and lived with Other (Comment) (lives with mentally handicapped uncle (pt is caregiver)) in a House home.  Home access is  Level entry.  Patient will benefit from skilled PT intervention to maximize safe functional mobility, minimize fall risk and decrease caregiver burden for planned discharge SNF secondary to decreased caregiver support and inaccessible home environment.  Anticipate patient will benefit from follow up HH at discharge.  PT - End of Session Activity Tolerance: Tolerates 30+ min activity with multiple rests Endurance Deficit: No PT Assessment Rehab Potential: Good Barriers to Discharge: Inaccessible home environment;Decreased caregiver support PT Plan PT Intensity: Minimum of 1-2 x/day ,45 to 90 minutes PT Frequency: 5 out of 7 days PT Duration Estimated Length of Stay: 7-10 days PT Treatment/Interventions: Ambulation/gait training;Balance/vestibular training;Cognitive remediation/compensation;Community reintegration;Discharge planning;Neuromuscular re-education;Functional mobility  training;DME/adaptive equipment instruction;Disease management/prevention;Pain management;Patient/family  education;Psychosocial support;Skin care/wound management;Splinting/orthotics;UE/LE Coordination activities;UE/LE Strength taining/ROM;Therapeutic Exercise;Therapeutic Activities;Stair training;Wheelchair propulsion/positioning PT Recommendation Recommendations for Other Services: Neuropsych consult Follow Up Recommendations: Skilled nursing facility;Home health PT (SNF? Due to decreased caregiver support/home environment) Patient destination: Skilled Nursing Facility (SNF) (SNF vs. Home) Equipment Recommended: Wheelchair (measurements);Wheelchair cushion (measurements);Sliding board Equipment Details: Patient owns RW. DME assessment ongoing, recommendations TBD upon discharge. DME recommendations will depend on patient's destination (home vs. SNF)  Skilled Therapeutic Intervention Attempted x4 sit>stand from wheelchair to RW. Patient initially clears gluts, then immediately returns to sitting; does not appear to make further effort to achieve standing secondary to anxiety and decreased motivation to participate in therapy session. Patient performed 2 sit>stands from wheelchair in // bars (using bars to pull up) with modA and maintains standing x30" each bout. Requires verbal and tactile cues for upright posture.  Added cushion to wheelchair as well as R amputee pad for R limb support. Educated patient about proper positioning of limb in wheelchair and in bed. Patient with requests to return to bed at end of session. Requires modA for lateral/scoot transfer wheelchair> bed and minA sit>supine. Patient left supine in bed with all needs within reach and bed alarm on.  PT Evaluation Precautions/Restrictions Precautions Precautions: Fall Restrictions Weight Bearing Restrictions: Yes RLE Weight Bearing: Non weight bearing General Chart Reviewed: Yes Family/Caregiver Present: No  Pain Pain Assessment Pain Assessment: 0-10 Pain Score: 5  Pain Type: Acute pain Pain Orientation: Right Pain  Descriptors / Indicators: Aching Pain Onset: On-going Patients Stated Pain Goal: 3 Pain Intervention(s): Repositioned;Ambulation/increased activity Home Living/Prior Functioning Home Living Living Arrangements: Other relatives Available Help at Discharge: Other (Comment) (Patient reports he will "have no help at discharge") Type of Home: House Home Access: Level entry Home Layout: One level Additional Comments: Patient reports a wheelchair will not fit through any door of his house (including front door) and a RW will not fit in the bathroonm. Patient reports he will have no help upon discharge and has no plans to find a new place to live with better accessibility.  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 Reports he is trying to get disability Vision/Perception  Vision - History Baseline Vision: Wears glasses only for reading Patient Visual Report: No change from baseline Vision - Assessment Eye Alignment: Within Functional Limits  Cognition Overall Cognitive Status: Within Functional Limits for tasks assessed Orientation Level: Oriented to person;Oriented to place;Oriented to situation;Oriented to time Problem Solving: Appears intact Safety/Judgment: Appears intact Sensation Sensation Light Touch: Appears Intact Proprioception: Appears Intact Coordination Gross Motor Movements are Fluid and Coordinated: Yes Motor  Motor Motor: Within Functional Limits Motor - Skilled Clinical Observations: R BKA  Mobility Bed Mobility Bed Mobility: Supine to Sit;Sit to Supine Supine to Sit: 3: Mod assist;HOB elevated;With rails Supine to Sit Details: Verbal cues for sequencing;Verbal cues for technique;Verbal cues for  precautions/safety;Manual facilitation for weight shifting;Manual facilitation for placement Sit to Supine: 4: Min assist;With rail;HOB flat Sit to Supine - Details: Verbal cues for sequencing;Manual facilitation for weight shifting;Verbal cues for technique;Verbal cues for precautions/safety;Manual facilitation for placement Transfers Squat Pivot Transfers: 3: Mod assist;With upper extremity assistance;From elevated surface Squat Pivot Transfer Details: Verbal cues for sequencing;Verbal cues for technique;Tactile cues for sequencing;Verbal cues for  precautions/safety;Manual facilitation for weight shifting Lateral/Scoot Transfers: 3: Mod assist;With armrests removed;From elevated surface;Other (comment) (pt refused use of slide board) Lateral/Scoot Transfer Details: Verbal cues for sequencing;Manual facilitation for weight shifting;Tactile cues for sequencing;Verbal cues for precautions/safety;Verbal cues for technique Locomotion  Ambulation Ambulation: No Ambulation/Gait Assistance: Not tested (comment) Gait Gait: No Stairs / Additional Locomotion Stairs: No Wheelchair Mobility Wheelchair Mobility: Yes Wheelchair Assistance: 5: Financial planner Details: Verbal cues for precautions/safety;Verbal cues for Diplomatic Services operational officer: Both upper extremities Wheelchair Parts Management: Needs assistance Distance: 80  Trunk/Postural Assessment  Cervical Assessment Cervical Assessment: Within Functional Limits (forward head posture) Thoracic Assessment Thoracic Assessment: Within Functional Limits Lumbar Assessment Lumbar Assessment: Within Functional Limits Postural Control Postural Control: Within Functional Limits  Balance Balance Balance Assessed: Yes Static Sitting Balance Static Sitting - Balance Support: Bilateral upper extremity supported;Feet supported Static Sitting - Level of Assistance: 5: Stand by assistance Static Standing Balance Static Standing  - Balance Support: Bilateral upper extremity supported Static Standing - Level of Assistance: 3: Mod assist Static Standing - Comment/# of Minutes: 2x30" in parallel bars. Attempted 3x with RW from wheelchair and unable to achieve standing secondary to patient's anxiety and fatigue. Extremity Assessment  RLE Assessment RLE Assessment: Exceptions to [facility] RLE Strength RLE Overall Strength: Deficits;Due to pain RLE Overall Strength Comments: Unable to formally assess MMT secondary to patin, functionally 2+/5 to 3/5 LLE Assessment LLE Assessment: Exceptions to [facility] LLE Strength LLE Overall Strength: Deficits;Due to premorbid status LLE Overall Strength Comments: Grossly 3/5  FIM:  FIM - Bed/Chair Transfer Bed/Chair Transfer Assistive Devices: Bed rails;Arm rests;HOB elevated Bed/Chair Transfer: 3: Supine > Sit: Mod A (lifting assist/Pt. 50-74%/lift 2 legs;4: Sit > Supine: Min A (steadying pt. > 75%/lift 1 leg);3: Bed > Chair or W/C: Mod A (lift or lower assist);3: Chair or W/C > Bed: Mod A (lift or lower assist) FIM - Locomotion: Wheelchair Distance: 80 Locomotion: Wheelchair: 2: Travels 50 - 149 ft with supervision, cueing or coaxing FIM - Locomotion: Ambulation Ambulation/Gait Assistance: Not tested (comment) Locomotion: Ambulation: 0: Activity did not occur FIM - Locomotion: Stairs Locomotion: Stairs: 0: Activity did not occur   Refer to Care Plan for Long Term Goals  Recommendations for other services: Neuropsych  Discharge Criteria: Patient will be discharged from PT if patient refuses treatment 3 consecutive times without medical reason, if treatment goals not met, if there is a change in medical status, if patient makes no progress towards goals or if patient is discharged from hospital.  The above assessment, treatment plan, treatment alternatives and goals were discussed and mutually agreed upon: by patient  Chipper Herb. Leona Alen, PT, DPT 08/22/2012, 11:43 AM

## 2012-08-22 NOTE — Progress Notes (Signed)
Subjective/Complaints: Didn't sleep well. Found bed uncomfortable. Denies other issues.  Objective: Vital Signs: Blood pressure 129/68, pulse 79, temperature 98.3 F (36.8 C), temperature source Oral, resp. rate 18, weight 75 kg (165 lb 5.5 oz), SpO2 99.00%. No results found.  Recent Labs  08/20/12 1124 08/22/12 0640  WBC 5.8 5.2  HGB 9.0* 8.6*  HCT 26.2* 25.2*  PLT 112* 136*    Recent Labs  08/20/12 1124 08/22/12 0640  NA 138 139  K 4.1 3.4*  CL 106 106  GLUCOSE 149* 96  BUN 26* 24*  CREATININE 1.06 0.99  CALCIUM 9.3 9.5   CBG (last 3)   Recent Labs  08/21/12 1633 08/21/12 2053 08/22/12 0729  GLUCAP 156* 122* 102*    Wt Readings from Last 3 Encounters:  08/22/12 75 kg (165 lb 5.5 oz)  08/21/12 74.3 kg (163 lb 12.8 oz)  08/21/12 74.3 kg (163 lb 12.8 oz)    Physical Exam:  Constitutional: He is oriented to person, place, and time. He appears well-nourished. Mild disorientation noted.  HENT:  Head: Normocephalic and atraumatic.  Eyes: Pupils are equal, round, and reactive to light.  Neck: Normal range of motion. Neck supple.  Cardiovascular: Normal rate and regular rhythm.  No murmur heard.  Pulmonary/Chest: Effort normal and breath sounds normal. No respiratory distress. He has no wheezes.  Abdominal: Soft. Bowel sounds are normal. He exhibits no distension. There is no tenderness.  Musculoskeletal: He exhibits edema, 1 to 2+ both legs.   BUE with muscle wasting distally. Left foot with 2+edema to knees. Right bk incision was clean and intact.  Neurological: He is alert and oriented to person, place, and time. Intact basic insight and awareness.  Skin: Skin is warm and dry.  Psychiatric: His speech is normal. His affect is blunt. He is withdrawn.  motor strength is 4-5/5 in bilateral deltoid, bicep, tricep, grip. 3+ to 4/5 with Left, hip flexors, knee extensors, ankle dorsi flexion plantarflexion  R LE with BKA has 3-/5 HF  Sensation reduced to sharp  touch in both hands and left foot    Assessment/Plan: 1. Functional deficits secondary to right bka which require 3+ hours per day of interdisciplinary therapy in a comprehensive inpatient rehab setting. Physiatrist is providing close team supervision and 24 hour management of active medical problems listed below. Physiatrist and rehab team continue to assess barriers to discharge/monitor patient progress toward functional and medical goals. FIM:    FIM - Upper Body Dressing/Undressing Upper body dressing/undressing steps patient completed: Thread/unthread right sleeve of pullover shirt/dresss;Thread/unthread left sleeve of pullover shirt/dress;Put head through opening of pull over shirt/dress;Pull shirt over trunk Upper body dressing/undressing: 5: Set-up assist to: Obtain clothing/put away FIM - Lower Body Dressing/Undressing Lower body dressing/undressing steps patient completed: Thread/unthread right pants leg;Thread/unthread left pants leg (completed rolling in bed) Lower body dressing/undressing: 3: Mod-Patient completed 50-74% of tasks        FIM - Bed/Chair Transfer Bed/Chair Transfer: 4: Supine > Sit: Min A (steadying Pt. > 75%/lift 1 leg);4: Sit > Supine: Min A (steadying pt. > 75%/lift 1 leg)     Comprehension Comprehension Mode: Auditory Comprehension: 5-Follows basic conversation/direction: With extra time/assistive device  Expression Expression Mode: Verbal Expression: 5-Expresses basic 90% of the time/requires cueing < 10% of the time.  Social Interaction Social Interaction: 4-Interacts appropriately 75 - 89% of the time - Needs redirection for appropriate language or to initiate interaction.  Problem Solving Problem Solving: 4-Solves basic 75 - 89% of the time/requires cueing  10 - 24% of the time  Memory Memory: 5-Recognizes or recalls 90% of the time/requires cueing < 10% of the time  Medical Problem List and Plan:  1. DVT Prophylaxis/Anticoagulation:  Pharmaceutical: Lovenox  2. Pain Management: Continue prn oxycodone.  3. Mood: Noted to have flat affect. Will monitor for now. Provide ego support. LCSW to follow for evaluation.  4. Neuropsych: This patient is capable of making decisions on his own behalf.  5. DM type 2: Will monitor BS with ac/hs cbg checks. Continue Lantus 20 units and use SSI for elevated BS.   -sugars controlled at present 6. Anemia of chronic illness: Recheck 8.6---follow up Thursday  -no active signs of bleeding at present  -heme check stools 7. HTN: Monitor with bid checks. BP poorly controlled.  8. Urinary retention: Continue Flomax. UA revealed a few yeast..,ucx pending  9. Constipation: Will schedule miralax. Refused suppository.  10. Confusion: New in past 48 hours--reports insomnia. Will check UCS. Schedule trazodone  LOS (Days) 1 A FACE TO FACE EVALUATION WAS PERFORMED  SWARTZ,ZACHARY T 08/22/2012 9:51 AM

## 2012-08-22 NOTE — Progress Notes (Signed)
Social Work Assessment and Plan Social Work Assessment and Plan  Patient Details  Name: Lawrence [name] MRN: [id number] Date of Birth: Jul 30, 1960  Today's Date: 08/22/2012  Problem List:  Patient Active Problem List   Diagnosis Date Noted  . Unilateral complete BKA--right 08/22/2012  . Urinary retention 08/19/2012  . Osteomyelitis of right foot 08/17/2012    Class: Diagnosis of  . ARF (acute renal failure) 06/03/2012  . Cellulitis 06/03/2012  . Post-operative wound abscess 05/29/2012  . Gangrene of toe 05/02/2012  . Chronic cough 02/22/2012  . Hematuria 03/23/2011  . Prostate asymmetry 03/23/2011  . Colon cancer screening 02/19/2011  . Neuropathy 08/25/2010  . Chronic kidney disease (CKD), stage 4 03/14/2009  . Accelerated hypertension 06/18/2008  . DM (diabetes mellitus), type 2, uncontrolled 12/26/2007  . HYPERCHOLESTEROLEMIA 12/26/2007   Past Medical History:  Past Medical History  Diagnosis Date  . Hypertension   . Chronic kidney disease   . Hypercholesteremia   . Diabetes mellitus     insulin dependent  . Depression   . PONV (postoperative nausea and vomiting)    Past Surgical History:  Past Surgical History  Procedure Laterality Date  . Amputation Right 05/03/2012    Procedure: Right First Ray AMPUTATION;  Surgeon: Eldred Manges, MD;  Location: [facility] OR;  Service: Orthopedics;  Laterality: Right;  . I&d extremity Right 05/29/2012    Procedure: IRRIGATION AND DEBRIDEMENT EXTREMITY- right foot;  Surgeon: Eldred Manges, MD;  Location: [facility] OR;  Service: Orthopedics;  Laterality: Right;  Right Foot Debridement, VAC Application  . Toe amputation Right 05/29/2012    1ST  ray      Dr Ophelia Charter  . Amputation Right 08/16/2012    Procedure: AMPUTATION BELOW KNEE;  Surgeon: Eldred Manges, MD;  Location: [facility] OR;  Service: Orthopedics;  Laterality: Right;  Right Below Knee Amputation   Social History:  reports that he has never smoked. He has never used smokeless tobacco. He reports that   drinks alcohol. He reports that he does not use illicit drugs.  Family / Support Systems Marital Status: Single Other Supports: Susie Careers information officer   [phone] Anticipated Caregiver: No caregiver Ability/Limitations of Caregiver: Kateri Mc is mentally challenged and needs supervision and sister is on worker's comp form being hurt at work Caregiver Availability: Other (Comment) (Needs SNF) Family Dynamics: Pt lives with his developmentally challenged uncle whom he provides supervision for.  He sister is local and supportive but is limited by her own health issues  Social History Preferred language: English Religion: Non-Denominational Cultural Background: No issues Education: High School Read: Yes Write: Yes Employment Status: Disabled Date Retired/Disabled/Unemployed: Four months-since foot got worse-appealing disability Engineer, agricultural Issues: Currently has a Clinical research associate working on his disability and Medicaid approval Guardian/Conservator: None-according to MD pt is capable of making his own decisions   Abuse/Neglect Physical Abuse: Denies Verbal Abuse: Denies Sexual Abuse: Denies Exploitation of patient/patient's resources: Denies Self-Neglect: Denies  Emotional Status Pt's affect, behavior adn adjustment status: Pt has a flat affect and reports he is depressed over losing his leg.  He reports prior to [facility] MD tried to place him on a anitdepressant and it was too costly for him, so did not take.  He is having pain and bowel and bladder issues now which is complicating his mood.  Offered peer support or [facility] eval.  He will think about, not right now. Recent Psychosocial Issues: Other medical issues-diabetes Pyschiatric History: Reports depression-would benefit form anti=depressant and  Neuro-psych eval.  Will disucss with PA.  WIll continue to offer support and see if changes mind on peer support.  Pt not wanting to do depression screen,  will attempt at another time. Substance Abuse History: No issues  Patient / Family Perceptions, Expectations & Goals Pt/Family understanding of illness & functional limitations: Pt is able to explain his amputation and problems resulting from this.  He feels the doctors did not try long enough to save his leg.  He reports: " It is my leg I wanted to save it."   Premorbid pt/family roles/activities: Caregiver for his uncle, Employed up til 4 months ago, Brother, Catering manager Anticipated changes in roles/activities/participation: Will need to go to a SNF so will not be the caregiver of his uncle, pt will need a caregiver Pt/family expectations/goals: Pt states: " I want to be in less pain and move better."  He also wants to get rid of his catherer.  Community Resources [employer]: Other (Comment) (Family Practice [facility]) Premorbid Home Care/DME Agencies: None Transportation available at discharge: Sister Resource referrals recommended: Support group (specify) (Amputee Support group)  Discharge Planning Living Arrangements: Other relatives Support Systems: Other relatives Type of Residence: Private residence Insurance Resources: Medicaid (specify county) (Pending) Financial Resources: Family Support Financial Screen Referred: Previously completed Living Expenses: Lives with family Money Management: Family Does the patient have any problems obtaining your medications?: Yes (Describe) (No insurance difficulty getting meds) Home Management: Pt and uncle Patient/Family Preliminary Plans: Uncle's home is not wheelchair accessible-therefor pt's option is from here to go to a NH until he is ambulatory and can return home.  Pt is aware it is more than likely he will need to go out of [place] for placement. Barriers to Discharge: Finances Social Work Anticipated Follow Up Needs: SNF;Support Group  Clinical Impression Pleasant simple gentleman who is grieving the loss of his leg.  He is willing  to work in therapies, but is aware the discharge plan is to go to a SNF-more than likely out of town Until he can ambulate and return home with his uncle.  Would benefit form antidepressant and Neuro-psych eval and supportive counseling.  Will follow up with.  Lucy Chris 08/22/2012, 10:21 AM

## 2012-08-22 NOTE — Progress Notes (Signed)
INITIAL NUTRITION ASSESSMENT  DOCUMENTATION CODES Per approved criteria  -Not Applicable   INTERVENTION: 1.  Modify diet; recommend diet liberalization to Regular to assist in improving intake and meeting nutrition needs. Recommend continuing monitoring of blood glucose for optimal healing.   NUTRITION DIAGNOSIS: Increased nutrient needs related to healing as evidenced by pt s/p AKA.   Monitor:  1.  Food/Beverage; pt meeting >/=90% estimated needs with tolerance. 2.  Wt/wt change; monitor trends  Reason for Assessment: MST  52 y.o. male  Admitting Dx: Unilateral complete BKA  ASSESSMENT: Pt admitted s/p BKA (7/9). Adequate glucose control currently. PO 75-100% prior to transfer to CIR, 5% today. RD met with pt who states "if I don't like it, I won't eat it."  Pt states that he has difficulty finding food he likes on CHO Mod Medium diet.  Pt reports he eats a Regular diet at home and has good glucose control which he describes as "120s." Last HgBA1C obtained in April.  Lab Results  Component Value Date   HGBA1C 8.6* 05/29/2012   Discussed the importance of good glucose control on healing and the need to maintain optimal blood sugars. Pt states understanding.  Height: Ht Readings from Last 1 Encounters:  08/21/12 5\' 5"  (1.651 m)    Weight: Wt Readings from Last 1 Encounters:  08/22/12 165 lb 5.5 oz (75 kg)    Ideal Body Weight: 58 kg  % Ideal Body Weight: 127%  Wt Readings from Last 10 Encounters:  08/22/12 165 lb 5.5 oz (75 kg)  08/21/12 163 lb 12.8 oz (74.3 kg)  08/21/12 163 lb 12.8 oz (74.3 kg)  08/15/12 163 lb 12.8 oz (74.3 kg)  06/13/12 190 lb (86.183 kg)  05/30/12 192 lb 0.3 oz (87.1 kg)  05/30/12 192 lb 0.3 oz (87.1 kg)  05/24/12 192 lb 1.6 oz (87.136 kg)  05/02/12 192 lb 14.4 oz (87.5 kg)  05/02/12 192 lb 14.4 oz (87.5 kg)    Usual Body Weight: 193 lbs, prior to BKA  BMI:  Body mass index is 27.51 kg/(m^2).  Estimated Nutritional Needs: Kcal:  2050-2350 Protein: 90-105g Fluid: >1.8 L/day  Skin: incision  Diet Order: Carb Control  EDUCATION NEEDS: -Education needs addressed   Intake/Output Summary (Last 24 hours) at 08/22/12 1404 Last data filed at 08/22/12 1320  Gross per 24 hour  Intake    320 ml  Output   1550 ml  Net  -1230 ml    Last BM: 7/8, pt refusing stool softeners or laxatives  Labs:   Recent Labs Lab 08/16/12 2001 08/20/12 1124 08/22/12 0640  NA  --  138 139  K  --  4.1 3.4*  CL  --  106 106  CO2  --  22 23  BUN  --  26* 24*  CREATININE 1.20 1.06 0.99  CALCIUM  --  9.3 9.5  GLUCOSE  --  149* 96    CBG (last 3)   Recent Labs  08/21/12 2053 08/22/12 0729 08/22/12 1121  GLUCAP 122* 102* 117*    Scheduled Meds: . aspirin  81 mg Oral Daily  . enoxaparin (LOVENOX) injection  40 mg Subcutaneous Q24H  . insulin aspart  0-9 Units Subcutaneous TID WC  . insulin glargine  20 Units Subcutaneous QHS  . metoprolol tartrate  25 mg Oral BID  . polyethylene glycol  17 g Oral Daily  . tamsulosin  0.4 mg Oral Daily  . traZODone  25 mg Oral QHS    Continuous Infusions:  Past Medical History  Diagnosis Date  . Hypertension   . Chronic kidney disease   . Hypercholesteremia   . Diabetes mellitus     insulin dependent  . Depression   . PONV (postoperative nausea and vomiting)     Past Surgical History  Procedure Laterality Date  . Amputation Right 05/03/2012    Procedure: Right First Ray AMPUTATION;  Surgeon: Eldred Manges, MD;  Location: [facility] OR;  Service: Orthopedics;  Laterality: Right;  . I&d extremity Right 05/29/2012    Procedure: IRRIGATION AND DEBRIDEMENT EXTREMITY- right foot;  Surgeon: Eldred Manges, MD;  Location: [facility] OR;  Service: Orthopedics;  Laterality: Right;  Right Foot Debridement, VAC Application  . Toe amputation Right 05/29/2012    1ST  ray      Dr Ophelia Charter  . Amputation Right 08/16/2012    Procedure: AMPUTATION BELOW KNEE;  Surgeon: Eldred Manges, MD;  Location[facility] OR;   Service: Orthopedics;  Laterality: Right;  Right Below Knee Amputation    Loyce Dys, MS RD LDN Clinical Inpatient Dietitian Pager: [phone] Weekend/After hours pager: [phone]

## 2012-08-22 NOTE — Progress Notes (Signed)
Occupational Therapy Session Note  Patient Details  Name: SPURGEON GANCARZ MRN: [id number] Date of Birth: 11-25-1960  Today's Date: 08/22/2012 Time: 4098-1191 Time Calculation (min): 30 min  Short Term Goals: Week 1:  OT Short Term Goal 1 (Week 1): Pt will complete toilet transfer with min assist OT Short Term Goal 2 (Week 1): Pt will complete LB dressing with min assist OT Short Term Goal 3 (Week 1): Pt will complete toilet task with min assist OT Short Term Goal 4 (Week 1): Pt will tolerate standing to complete 1 grooming task  Skilled Therapeutic Interventions/Progress Updates:  Therapy session focused on functional transfers, activity tolerance, and sit<>stand. Pt received sitting in recliner chair with decreased motivation for therapy asking to just sit and watch tv. Pt willing to participate with minimal encouragement. Completed squat pivot transfer recliner>bed with mod assist. Required rest break then completed sit<>stand 3x with mod-min assist and rest breaks between each. Pt tolerated standing 10-15 sec for each trial. Pt completed squat pivot transfer bed>w/c with mod assist. Pt left sitting in w/c with all items in reach.   Therapy Documentation Precautions:  Precautions Precautions: Fall Restrictions Weight Bearing Restrictions: Yes RLE Weight Bearing: Non weight bearing General:   Vital Signs:   Pain: Pt reported 7/10 pain.   Other Treatments:    See FIM for current functional status  Therapy/Group: Individual Therapy  Daneil Dan 08/22/2012, 3:12 PM

## 2012-08-22 NOTE — Plan of Care (Signed)
Problem: RH BOWEL ELIMINATION Goal: RH STG MANAGE BOWEL WITH ASSISTANCE STG Manage Bowel with Assistance.  Outcome: Not Progressing 08/22/12 Patient refused all bowel medication at HS. LBM 08/15/12

## 2012-08-22 NOTE — Progress Notes (Signed)
Occupational Therapy Assessment and Plan  Patient Details  Name: Lawrence [name] MRN: [id number] Date of Birth: 11-17-1960  OT Diagnosis: acute pain and muscle weakness (generalized) Rehab Potential: Rehab Potential: Good ELOS: 7-10   Today's Date: 08/22/2012 Time: 0730-0825 Time calculation (min): 55 min   Problem List:  Patient Active Problem List   Diagnosis Date Noted  . Unilateral complete BKA--right 08/22/2012  . Urinary retention 08/19/2012  . Osteomyelitis of right foot 08/17/2012    Class: Diagnosis of  . ARF (acute renal failure) 06/03/2012  . Cellulitis 06/03/2012  . Post-operative wound abscess 05/29/2012  . Gangrene of toe 05/02/2012  . Chronic cough 02/22/2012  . Hematuria 03/23/2011  . Prostate asymmetry 03/23/2011  . Colon cancer screening 02/19/2011  . Neuropathy 08/25/2010  . Chronic kidney disease (CKD), stage 4 03/14/2009  . Accelerated hypertension 06/18/2008  . DM (diabetes mellitus), type 2, uncontrolled 12/26/2007  . HYPERCHOLESTEROLEMIA 12/26/2007    Past Medical History:  Past Medical History  Diagnosis Date  . Hypertension   . Chronic kidney disease   . Hypercholesteremia   . Diabetes mellitus     insulin dependent  . Depression   . PONV (postoperative nausea and vomiting)    Past Surgical History:  Past Surgical History  Procedure Laterality Date  . Amputation Right 05/03/2012    Procedure: Right First Ray AMPUTATION;  Surgeon: Eldred Manges, MD;  Location: [facility] OR;  Service: Orthopedics;  Laterality: Right;  . I&d extremity Right 05/29/2012    Procedure: IRRIGATION AND DEBRIDEMENT EXTREMITY- right foot;  Surgeon: Eldred Manges, MD;  Location: [facility] OR;  Service: Orthopedics;  Laterality: Right;  Right Foot Debridement, VAC Application  . Toe amputation Right 05/29/2012    1ST  ray      Dr Ophelia Charter  . Amputation Right 08/16/2012    Procedure: AMPUTATION BELOW KNEE;  Surgeon: Eldred Manges, MD;  Location: [facility] OR;  Service: Orthopedics;  Laterality:  Right;  Right Below Knee Amputation    Assessment & Plan Clinical Impression: Patient is a 52 y.o. year old male with recent admission to the hospital on 08/16/12 for R-BKA by Dr. Ophelia Charter. Post op with difficulty voiding requiring in and out caths. He is limited by pain, anxiety and deconditioning. Pain control improving but some confusion reporte. Foley replaced due to retention. Patient transferred to CIR on 08/21/2012 .    Patient currently requires mod with basic self-care skills secondary to muscle weakness and decreased cardiorespiratoy endurance.  Prior to hospitalization, patient could complete BADLs with independent .  Patient will benefit from skilled intervention to increase independence with basic self-care skills prior to discharge SNF as patient has decreased caregiver support.  Anticipate patient will require 24 hour supervision and skilled OT services.  OT - End of Session Endurance Deficit: No OT Assessment Rehab Potential: Good Barriers to Discharge: Decreased caregiver support OT Plan OT Intensity: Minimum of 1-2 x/day, 45 to 90 minutes OT Frequency: 5 out of 7 days OT Duration/Estimated Length of Stay: 7-10 OT Treatment/Interventions: Cognitive remediation/compensation;Discharge planning;Community reintegration;Balance/vestibular training;DME/adaptive equipment instruction;Disease mangement/prevention;Functional mobility training;Patient/family education;Psychosocial support;Self Care/advanced ADL retraining;Skin care/wound managment;Therapeutic Activities;UE/LE Strength taining/ROM;UE/LE Coordination activities;Therapeutic Exercise OT Recommendation Patient destination: Skilled Nursing Facility (SNF) Follow Up Recommendations: Skilled nursing facility   Skilled Therapeutic Intervention Skilled therapeutic intervention following evaluation focused on sitting balance, sit<>stand, and ADL retraining. Pt declined self-care tasks originally stating his sister would help him  later. OT provided education on therapy and CIR and pt agreed to only  complete dressing. Initially declined standing and attempted weight shifting to each side to manage clothing around waist. Required mod assist for sit<>stand on 2 occasions with RW and limited to standing tolerance of approx 3-5 sec secondary to weakness. Pt complete sit<>supine with min assist. Pt with mild lob while sitting unsupported eob to don UB and LB clothing. OT encouraged pt to practice toilet transfer or bed>w/c transfer however he declined stating he wanted to go to bed because he didn't get much sleep. Pt also declined completing any sliding board transfers.   OT Evaluation Precautions/Restrictions  Precautions Precautions: Fall Restrictions Weight Bearing Restrictions: Yes RLE Weight Bearing: Non weight bearing General   Vital Signs   Pain Pain Assessment Pain Assessment: 0-10 Pain Score: 5  Pain Type: Acute pain Pain Orientation: Right Pain Descriptors / Indicators: Aching Pain Onset: On-going Patients Stated Pain Goal: 3 Pain Intervention(s): Repositioned;Ambulation/increased activity Home Living/Prior Functioning Home Living Living Arrangements: Other relatives Available Help at Discharge: Other (Comment) (Patient reports he will "have no help at discharge") Type of Home: House Home Access: Level entry Home Layout: One level Additional Comments: Patient reports a wheelchair will not fit through any door of his house (including front door) and a RW will not fit in the bathroonm. Patient reports he will have no help upon discharge and has no plans to find a new place to live with better accessibility.  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 Reports he is trying to get disability ADL   Vision/Perception  Vision - History Baseline Vision: Wears glasses only for reading Patient Visual Report: No change from baseline Vision - Assessment Eye Alignment: Within Functional Limits  Cognition Overall Cognitive Status: Within Functional Limits for tasks assessed Orientation Level: Oriented to person;Oriented to place;Oriented to situation;Oriented to time Problem Solving: Appears intact Safety/Judgment: Appears intact Sensation Sensation Light Touch: Appears Intact Proprioception: Appears Intact Coordination Gross Motor Movements are Fluid and Coordinated: Yes Motor  Motor Motor: Within Functional Limits Motor - Skilled Clinical Observations: R BKA Mobility  Bed Mobility Bed Mobility: Supine to Sit;Sit to Supine Supine to Sit: 3: Mod assist;HOB elevated;With rails Supine to Sit Details: Verbal cues for sequencing;Verbal cues for technique;Verbal cues for precautions/safety;Manual facilitation for weight shifting;Manual facilitation for placement Sit to Supine: 4: Min assist;With rail;HOB flat Sit to Supine - Details: Verbal cues for sequencing;Manual facilitation for weight shifting;Verbal cues for technique;Verbal cues for precautions/safety;Manual facilitation for placement  Trunk/Postural Assessment  Cervical Assessment Cervical Assessment: Within Functional Limits (forward head posture) Thoracic Assessment Thoracic Assessment: Within Functional Limits Lumbar Assessment Lumbar Assessment: Within Functional Limits Postural Control Postural Control: Within Functional Limits  Balance Balance Balance Assessed: Yes Static Sitting Balance Static Sitting - Balance Support: Bilateral upper extremity supported;Feet supported Static Sitting - Level of Assistance: 5: Stand by assistance Static Standing Balance Static Standing - Balance Support: Bilateral upper extremity supported Static Standing - Level of  Assistance: 3: Mod assist Static Standing - Comment/# of Minutes: 2x30" in parallel bars. Attempted 3x with RW from wheelchair and unable to achieve standing secondary to patient's anxiety and fatigue. Extremity/Trunk Assessment RUE Assessment RUE Assessment: Within Functional Limits LUE Assessment LUE Assessment: Within Functional Limits  FIM:  FIM - Eating Eating Activity: 5: Set-up assist for open containers FIM -  Grooming Grooming Steps: Oral care, brush teeth, clean dentures Grooming: 5: Set-up assist to obtain items FIM - Upper Body Dressing/Undressing Upper body dressing/undressing steps patient completed: Thread/unthread right sleeve of pullover shirt/dresss;Thread/unthread left sleeve of pullover shirt/dress;Put head through opening of pull over shirt/dress;Pull shirt over trunk Upper body dressing/undressing: 5: Set-up assist to: Obtain clothing/put away FIM - Lower Body Dressing/Undressing Lower body dressing/undressing steps patient completed: Thread/unthread right pants leg;Thread/unthread left pants leg (completed rolling in bed) Lower body dressing/undressing: 3: Mod-Patient completed 50-74% of tasks FIM - Press photographer Assistive Devices: Bed rails;Arm rests;HOB elevated Bed/Chair Transfer: 3: Supine > Sit: Mod A (lifting assist/Pt. 50-74%/lift 2 legs;4: Sit > Supine: Min A (steadying pt. > 75%/lift 1 leg);3: Bed > Chair or W/C: Mod A (lift or lower assist);3: Chair or W/C > Bed: Mod A (lift or lower assist)   Refer to Care Plan for Long Term Goals  Recommendations for other services: None  Discharge Criteria: Patient will be discharged from OT if patient refuses treatment 3 consecutive times without medical reason, if treatment goals not met, if there is a change in medical status, if patient makes no progress towards goals or if patient is discharged from hospital.  The above assessment, treatment plan, treatment alternatives and goals were  discussed and mutually agreed upon: by patient  Daneil Dan 08/22/2012, 11:45 AM

## 2012-08-23 ENCOUNTER — Inpatient Hospital Stay (HOSPITAL_COMMUNITY): Payer: Self-pay | Admitting: Occupational Therapy

## 2012-08-23 ENCOUNTER — Inpatient Hospital Stay (HOSPITAL_COMMUNITY): Payer: Self-pay | Admitting: *Deleted

## 2012-08-23 ENCOUNTER — Inpatient Hospital Stay (HOSPITAL_COMMUNITY): Payer: Self-pay

## 2012-08-23 DIAGNOSIS — I739 Peripheral vascular disease, unspecified: Secondary | ICD-10-CM

## 2012-08-23 DIAGNOSIS — E1165 Type 2 diabetes mellitus with hyperglycemia: Secondary | ICD-10-CM

## 2012-08-23 DIAGNOSIS — I10 Essential (primary) hypertension: Secondary | ICD-10-CM

## 2012-08-23 DIAGNOSIS — L98499 Non-pressure chronic ulcer of skin of other sites with unspecified severity: Secondary | ICD-10-CM

## 2012-08-23 DIAGNOSIS — S88119A Complete traumatic amputation at level between knee and ankle, unspecified lower leg, initial encounter: Secondary | ICD-10-CM

## 2012-08-23 DIAGNOSIS — D62 Acute posthemorrhagic anemia: Secondary | ICD-10-CM

## 2012-08-23 DIAGNOSIS — IMO0001 Reserved for inherently not codable concepts without codable children: Secondary | ICD-10-CM

## 2012-08-23 LAB — GLUCOSE, CAPILLARY
Glucose-Capillary: 183 mg/dL — ABNORMAL HIGH (ref 70–99)
Glucose-Capillary: 135 mg/dL — ABNORMAL HIGH (ref 70–99)
Glucose-Capillary: 163 mg/dL — ABNORMAL HIGH (ref 70–99)

## 2012-08-23 MED ORDER — SENNOSIDES-DOCUSATE SODIUM 8.6-50 MG PO TABS
2.0000 | ORAL_TABLET | Freq: Two times a day (BID) | ORAL | Status: AC
  Administered 2012-08-23: 2 via ORAL
  Filled 2012-08-23: qty 2

## 2012-08-23 MED ORDER — SENNOSIDES-DOCUSATE SODIUM 8.6-50 MG PO TABS
2.0000 | ORAL_TABLET | Freq: Every day | ORAL | Status: DC
  Administered 2012-08-24 – 2012-08-31 (×8): 2 via ORAL
  Filled 2012-08-23 (×8): qty 2

## 2012-08-23 MED ORDER — POTASSIUM CHLORIDE CRYS ER 20 MEQ PO TBCR
20.0000 meq | EXTENDED_RELEASE_TABLET | Freq: Once | ORAL | Status: AC
  Administered 2012-08-23: 20 meq via ORAL
  Filled 2012-08-23: qty 1

## 2012-08-23 NOTE — Progress Notes (Signed)
Occupational Therapy Session Note  Patient Details  Name: Lawrence [name] MRN: [id number] Date of Birth: 1960/10/23  Today's Date: 08/23/2012 Time: 4098-1191 Time Calculation (min): 62 min  Short Term Goals: Week 1:  OT Short Term Goal 1 (Week 1): Pt will complete toilet transfer with min assist OT Short Term Goal 2 (Week 1): Pt will complete LB dressing with min assist OT Short Term Goal 3 (Week 1): Pt will complete toilet task with min assist OT Short Term Goal 4 (Week 1): Pt will tolerate standing to complete 1 grooming task  Skilled Therapeutic Interventions/Progress Updates:    Pt worked on transfers from wheelchair to mat with and without sliding board.  He needs max assist for scoot pivot transfer without use of board but can transfer with the sliding board and mod assist.  While sitting on the mat worked on using push up blocks to get lift from the mat for increased UE strength.  Performed 3 sets of 5 reps with rest in between.  Pt able to achieve full elbow extension on 2 occasions and maintain for 2-3 seconds.  Most of the time only able to achieve 50-75% of full repetition.  Transitioned to working on sit to stand at the parallel bars and then at the high low table.  Pt needs total assist for initial sit to stand from the wheelchair, but is able to stand for 3-6 mins with min assist for static standing balance as long as he has UE support.  Performed four intervals of this, with pt needing max instructional cueing to push up from the wheelchair.  Pt was also able to stand with min assist and alternate reaching with each UE to place/move checkers at the high/low table as well.    Therapy Documentation Precautions:  Precautions Precautions: Fall Precaution Comments: R BKA Restrictions Weight Bearing Restrictions: Yes RLE Weight Bearing: Non weight bearing  Vital Signs: Therapy Vitals Temp: 98.4 F (36.9 C) Temp src: Oral Pulse Rate: 76 Resp: 18 BP: 126/69 mmHg Patient  Position, if appropriate: Lying Oxygen Therapy SpO2: 98 % O2 Device: None (Room air) Pain: Pain Assessment Pain Assessment: 0-10 Pain Score: 8  Pain Type: Phantom pain Pain Location: Foot Pain Orientation: Right Pain Descriptors / Indicators: Aching Pain Frequency: Occasional Pain Onset: Gradual Patients Stated Pain Goal: 3 Pain Intervention(s): Medication (See eMAR)  See FIM for current functional status  Therapy/Group: Individual Therapy  Ryann Leavitt OTR/L 08/23/2012, 3:56 PM

## 2012-08-23 NOTE — Progress Notes (Signed)
Social Work Patient ID: Lawrence [name], male   DOB: 07/29/1960, 52 y.o.   MRN: [id number] Met with pt to inform of team conference goals-supervision w/c level and discharge plan is NHP.  He reports he would like to stay here as long as possible before going to a NH.  He reports his tailbone is hurting.  Discussed changing his cushion but he declined and states; " It will be ok I will adjust myself." Discussed it may take some time to find a NH bed, he is fine with this.  Will work toward discharge and update FL2 and send out.

## 2012-08-23 NOTE — Evaluation (Signed)
Recreational Therapy Assessment and Plan  Patient Details  Name: Lawrence [name] MRN: [id number] Date of Birth: [birth year] Today's Date: 08/23/2012  Rehab Potential: Good ELOS:     Assessment Clinical Impression: Problem List:  Patient Active Problem List    Diagnosis  Date Noted   .  Unilateral complete BKA--right  08/22/2012   .  Urinary retention  08/19/2012   .  Osteomyelitis of right foot  08/17/2012     Class: Diagnosis of   .  ARF (acute renal failure)  06/03/2012   .  Cellulitis  06/03/2012   .  Post-operative wound abscess  05/29/2012   .  Gangrene of toe  05/02/2012   .  Chronic cough  02/22/2012   .  Hematuria  03/23/2011   .  Prostate asymmetry  03/23/2011   .  Colon cancer screening  02/19/2011   .  Neuropathy  08/25/2010   .  Chronic kidney disease (CKD), stage 4  03/14/2009   .  Accelerated hypertension  06/18/2008   .  DM (diabetes mellitus), type 2, uncontrolled  12/26/2007   .  HYPERCHOLESTEROLEMIA  12/26/2007    Past Medical History:  Past Medical History   Diagnosis  Date   .  Hypertension    .  Chronic kidney disease    .  Hypercholesteremia    .  Diabetes mellitus      insulin dependent   .  Depression    .  PONV (postoperative nausea and vomiting)     Past Surgical History:  Past Surgical History   Procedure  Laterality  Date   .  Amputation  Right  05/03/2012     Procedure: Right First Ray AMPUTATION; Surgeon: Eldred Manges, MD; Location: [facility] OR; Service: Orthopedics; Laterality: Right;   .  I&d extremity  Right  05/29/2012     Procedure: IRRIGATION AND DEBRIDEMENT EXTREMITY- right foot; Surgeon: Eldred Manges, MD; Location: [facility] OR; Service: Orthopedics; Laterality: Right; Right Foot Debridement, VAC Application   .  Toe amputation  Right  05/29/2012     1ST ray Dr Ophelia Charter   .  Amputation  Right  08/16/2012     Procedure: AMPUTATION BELOW KNEE; Surgeon: Eldred Manges, MD; Location: [facility] OR; Service: Orthopedics; Laterality: Right; Right Below Knee  Amputation    Assessment & Plan  Clinical Impression: Lawrence [name] is a 52 y.o. male with history of DM, CKD, nonhealing ulcer right foot with osteomyelitis requiring Ray 05/03/12. Patient with wound dehiscence, poor healing despite VAC and significant small vessel disease. He was admitted on 08/16/12 for R-BKA by Dr. Ophelia Charter. Post op with difficulty voiding requiring in and out caths. He is limited by pain, anxiety and deconditioning. Pain control improving but some confusion reporte. Foley replaced due to retention. MD and therapy recommending CIR. Patient transferred to CIR on 08/21/2012 .  Patient currently requires mod with mobility secondary to muscle weakness and decreased standing balance and decreased balance strategies. Prior to hospitalization, patient was modified independent with mobility and lived with Other (Comment) (lives with mentally handicapped uncle (pt is caregiver)) in a House home. Home access is Level entry.  Pt presents with decreased activity tolerance, decreased functional mobility, decreased balance Limiting pt's independence with leisure/community pursuits.Leisure History/Participation Premorbid leisure interest/current participation: Garment/textile technologist - Journalist, newspaper - Press photographer - Travel (Comment);Sports - Other (Comment) (driving) Expression Interests: Music (Comment) Other Leisure Interests: Television Leisure Participation Style: Alone;With Family/Friends Awareness of Community Resources: Fair-identify 2 post discharge  leisure resources Psychosocial / Spiritual Patient agreeable to Pet Therapy: Yes Does patient have pets?: No Social interaction - Mood/Behavior: Cooperative Film/video editor for Education?: Yes Recreational Therapy Orientation Orientation -Reviewed with patient: Available activity resources Strengths/Weaknesses Patient Strengths/Abilities: Willingness to participate Patient weaknesses: Physical  limitations  Plan Rec Therapy Plan Is patient appropriate for Therapeutic Recreation?: Yes Rehab Potential: Good Treatment times per week: Min 1 time per week >20 minutes TR Treatment/Interventions: Adaptive equipment instruction;1:1 session;Balance/vestibular training;Functional mobility training;Patient/family education;Community reintegration;Recreation/leisure participation;Therapeutic activities;UE/LE Coordination activities;Therapeutic exercise;Wheelchair propulsion/positioning  Recommendations for other services: Neuropsych  Discharge Criteria: Patient will be discharged from TR if patient refuses treatment 3 consecutive times without medical reason.  If treatment goals not met, if there is a change in medical status, if patient makes no progress towards goals or if patient is discharged from hospital.  The above assessment, treatment plan, treatment alternatives and goals were discussed and mutually agreed upon: by patient  Kathyann Spaugh 08/23/2012, 3:57 PM

## 2012-08-23 NOTE — Progress Notes (Signed)
Occupational Therapy Session Note  Patient Details  Name: [name] MRN: [id number] Date of Birth: 04/11/1960  Today's Date: 08/23/2012 Time: 0802-0901 Time Calculation (min): 59 min  Short Term Goals: Week 1:  OT Short Term Goal 1 (Week 1): Pt will complete toilet transfer with min assist OT Short Term Goal 2 (Week 1): Pt will complete LB dressing with min assist OT Short Term Goal 3 (Week 1): Pt will complete toilet task with min assist OT Short Term Goal 4 (Week 1): Pt will tolerate standing to complete 1 grooming task  Skilled Therapeutic Interventions/Progress Updates:    Pt performed bathing sit to stand at the sink.  He needs total assist to achieve sit to stand for LB selfcare tasks.  Pt able to stand for 15-30 second intervals while therapist assisted with washing peri area and pulling pants over hips.  Lawrence [name] is currently unable to release his UEs to perform any LB selfcare in standing at this time.  Pt progressed to grooming tasks in sitting from the wheelchair as well.  Finished session by working on Research officer, political party wheelchair to the gym with [employer] for technique.  Therapy Documentation Precautions:  Precautions Precautions: Fall Restrictions Weight Bearing Restrictions: Yes RLE Weight Bearing: Non weight bearing   Pain: Pain Assessment Pain Assessment: No/denies pain ADL: See FIM for current functional status  Therapy/Group: Individual Therapy  Kriya Westra OTR/L 08/23/2012, 10:15 AM

## 2012-08-23 NOTE — Progress Notes (Signed)
Physical Therapy Note  Patient Details  Name: RODGERS LIKES MRN: [id number] Date of Birth: 1960/09/01 Today's Date: 08/23/2012  2:00 - 2:30 30 minutes Individual session Patient reports 8/10 pain in right foot.   Patient sitting in wheelchair upon entering room. Patient propelled wheelchair 100 feet using UE's and requiring min assist to avoid objects on right. Patient performed scooting transfer to left wheelchair to mat with min steady assist and 1 mod assist for lifting over brake. Patient performed sitting LE exercises x 12 reps for LAQ's, hip flexion and towel squeezes. Patient sit to supine with min assist. Patient performed LE exercises in supine x 12 reps including: SLR and hip abduction. Patient supine to sit with mod assist for trunk. Patient left sitting on mat awaiting OT.   Arelia Longest M 08/23/2012, 2:51 PM

## 2012-08-23 NOTE — Progress Notes (Signed)
Pt refuses to take any stool softner or laxative, Pt has not had a BM in 7 days, RN explained the implications of not having a bowel movement to the pt, Marissa Nestle, PA made aware

## 2012-08-23 NOTE — Patient Care Conference (Signed)
Inpatient RehabilitationTeam Conference and Plan of Care Update Date: 08/23/2012   Time: 11:00 AM    Patient Name: Lawrence [name]      Medical Record Number: [id number]  Date of Birth: 1960/05/31 Sex: Male         Room/Bed: 4W22C/4W22C-01 Payor Info: Payor: /    Admitting Diagnosis: R BKA  Admit Date/Time:  08/21/2012  6:33 PM Admission Comments: No comment available   Primary Diagnosis:  Unilateral complete BKA Principal Problem: Unilateral complete BKA  Patient Active Problem List   Diagnosis Date Noted  . Unilateral complete BKA--right 08/22/2012  . Urinary retention 08/19/2012  . Osteomyelitis of right foot 08/17/2012    Class: Diagnosis of  . ARF (acute renal failure) 06/03/2012  . Cellulitis 06/03/2012  . Post-operative wound abscess 05/29/2012  . Gangrene of toe 05/02/2012  . Chronic cough 02/22/2012  . Hematuria 03/23/2011  . Prostate asymmetry 03/23/2011  . Colon cancer screening 02/19/2011  . Neuropathy 08/25/2010  . Chronic kidney disease (CKD), stage 4 03/14/2009  . Accelerated hypertension 06/18/2008  . DM (diabetes mellitus), type 2, uncontrolled 12/26/2007  . HYPERCHOLESTEROLEMIA 12/26/2007    Expected Discharge Date:    Team Members Present: Physician leading conference: Dr. Faith Rogue Social Worker Present: Dossie Der, LCSW Nurse Present: Rosalio Macadamia, RN PT Present: Edson Snowball, PT;Caroline Adriana Simas, PT;Other (comment) Clarisse Gouge Ripa-PT) OT Present: Other (comment);Leonette Monarch, Felipa Eth, OT (Kayla Perkinson-OT) SLP Present: Fae Pippin, SLP     Current Status/Progress Goal Weekly Team Focus  Medical   right bka, urine retention, constipation  pros ed, wound care  pain mgt, bowel and bladder mgt   Bowel/Bladder   Foley to SD; Continent of bowel; LBM 08/16/12.Pt has refused all laxatives and stool softeners  Pt encouraged to take laxatives to promote BM  monitor foley output and encourage use of laxatives and softners   Swallow/Nutrition/  Hydration     na        ADL's   mod assit with LB dressing and sit<>stand; declined bathing on day of eval; declined sliding board transfer  supervision  strengthening, functional transfers, standing and sitting balance, education, weight shifting   Mobility   modA transfers and bed mobility; S wheelchair mobility  S overall  safety, bed mobility, transfers, wheelchair mobility, positioning, strengthening, activity tolerance   Communication     na        Safety/Cognition/ Behavioral Observations    na        Pain   Oxycodone 5mg -325mg  1-2 tabs PO Q4h for moderate to severe pain; Tramadol 50mg  Q6h prn PO for mild pain  pain < 3  assess and monitor pain qshift and prn   Skin     Monitor amputee incision           *See Care Plan and progress notes for long and short-term goals.  Barriers to Discharge: pain, accesibility into home    Possible Resolutions to Barriers:  home mods, snf placement    Discharge Planning/Teaching Needs:  Pt for NHP-home not accessable for wheelchair.  Pt on board with plan.  Neuro-psych to see for coping      Team Discussion:  Foley d/c see if can void.  Has agreed to bowel meds .  Wants to stay until  gets his prothesis, which he will not be.  Home not w/c accessible so plan to go to NH at discharge  Revisions to Treatment Plan:  NHP   Continued Need for Acute Rehabilitation Level of  Care: The patient requires daily medical management by a physician with specialized training in physical medicine and rehabilitation for the following conditions: Daily direction of a multidisciplinary physical rehabilitation program to ensure safe treatment while eliciting the highest outcome that is of practical value to the patient.: Yes Daily medical management of patient stability for increased activity during participation in an intensive rehabilitation regime.: Yes Daily analysis of laboratory values and/or radiology reports with any subsequent need for medication  adjustment of medical intervention for : Post surgical problems  Elena Davia, Lemar Livings 08/25/2012, 1:33 PM

## 2012-08-23 NOTE — Progress Notes (Signed)
Physical Therapy Session Note  Patient Details  Name: Lawrence [name] MRN: [id number] Date of Birth: 10/03/1960  Today's Date: 08/23/2012 Time: 1478-2956 Time Calculation (min): 41 min  Short Term Goals: Week 1:  PT Short Term Goal 1 (Week 1): Patient will perform bed mobility with min assist. PT Short Term Goal 2 (Week 1): Patient will perform lateral/scoot or slide board transfers with min assist. PT Short Term Goal 3 (Week 1): Patient will propel wheelchair 150' with supervision.  Skilled Therapeutic Interventions/Progress Updates:    Patient received sitting in wheelchair. Session focused on wheelchair mobility, transfers, and LE therex. See details below for wheelchair mobility. Patient transferred wheelchair<>mat via squat pivot transfer modA. Sit>supine min A, supine>sit modA. In supine, patient performed 2 sets x10 reps of R SLR, L LE bridging.  Discussion with patient about discharge location (SNF), goals, ELOS, etc. Patient very concerned about "being kicked out too early" and reports that he is "going to make very slow progress and will make slower progress if necessary to stay longer." Educated patient about his goals of supervision level, anticipated progress, etc. Patient returned to room and left seated in wheelchair with all needs within reach.  Therapy Documentation Precautions:  Precautions Precautions: Fall Precaution Comments: R BKA Restrictions Weight Bearing Restrictions: Yes RLE Weight Bearing: Non weight bearing Pain: Pain Assessment Pain Assessment: No/denies pain Pain Score: 0-No pain Locomotion : Ambulation Ambulation: No Ambulation/Gait Assistance: Not tested (comment) Gait Gait: No Stairs / Additional Locomotion Stairs: No Corporate treasurer: Yes Wheelchair Assistance: 5: Financial planner Details: Verbal cues for precautions/safety;Verbal cues for Diplomatic Services operational officer: Both upper  extremities Wheelchair Parts Management: Needs assistance Distance: 85   See FIM for current functional status  Therapy/Group: Individual Therapy  Chipper Herb. Iya Hamed, PT, DPT 08/23/2012, 10:27 AM

## 2012-08-23 NOTE — Progress Notes (Signed)
Subjective/Complaints: Slept a little better last night. No new issues today. A 12 point review of systems has been performed and if not noted above is otherwise negative. .  Objective: Vital Signs: Blood pressure 145/82, pulse 72, temperature 98.1 F (36.7 C), temperature source Oral, resp. rate 18, weight 75 kg (165 lb 5.5 oz), SpO2 99.00%. No results found.  Recent Labs  08/20/12 1124 08/22/12 0640  WBC 5.8 5.2  HGB 9.0* 8.6*  HCT 26.2* 25.2*  PLT 112* 136*    Recent Labs  08/20/12 1124 08/22/12 0640  NA 138 139  K 4.1 3.4*  CL 106 106  GLUCOSE 149* 96  BUN 26* 24*  CREATININE 1.06 0.99  CALCIUM 9.3 9.5   CBG (last 3)   Recent Labs  08/22/12 1638 08/22/12 2138 08/23/12 0728  GLUCAP 119* 124* 119*    Wt Readings from Last 3 Encounters:  08/22/12 75 kg (165 lb 5.5 oz)  08/21/12 74.3 kg (163 lb 12.8 oz)  08/21/12 74.3 kg (163 lb 12.8 oz)    Physical Exam:  Constitutional: He is oriented to person, place, and time. He appears well-nourished. Mild disorientation noted.  HENT:  Head: Normocephalic and atraumatic.  Eyes: Pupils are equal, round, and reactive to light.  Neck: Normal range of motion. Neck supple.  Cardiovascular: Normal rate and regular rhythm.  No murmur heard.  Pulmonary/Chest: Effort normal and breath sounds normal. No respiratory distress. He has no wheezes.  Abdominal: Soft. Bowel sounds are normal. He exhibits no distension. There is no tenderness.  Musculoskeletal: He exhibits edema, 1 to 2+ both legs.   BUE with muscle wasting distally. Left foot with 2+edema to knees. Right bk incision was clean and intact.  Neurological: He is alert and oriented to person, place, and time. Intact basic insight and awareness.  Skin: Skin is warm and dry.  Psychiatric: His speech is normal. His affect is blunt. He is withdrawn.  motor strength is 4-5/5 in bilateral deltoid, bicep, tricep, grip. 3+ to 4/5 with Left, hip flexors, knee extensors, ankle  dorsi flexion plantarflexion  R LE with BKA has 3-/5 HF  Sensation reduced to sharp touch in both hands and left foot    Assessment/Plan: 1. Functional deficits secondary to right bka which require 3+ hours per day of interdisciplinary therapy in a comprehensive inpatient rehab setting. Physiatrist is providing close team supervision and 24 hour management of active medical problems listed below. Physiatrist and rehab team continue to assess barriers to discharge/monitor patient progress toward functional and medical goals. FIM:    FIM - Upper Body Dressing/Undressing Upper body dressing/undressing steps patient completed: Thread/unthread right sleeve of pullover shirt/dresss;Thread/unthread left sleeve of pullover shirt/dress;Put head through opening of pull over shirt/dress;Pull shirt over trunk Upper body dressing/undressing: 5: Set-up assist to: Obtain clothing/put away FIM - Lower Body Dressing/Undressing Lower body dressing/undressing steps patient completed: Thread/unthread right pants leg;Thread/unthread left pants leg (completed rolling in bed) Lower body dressing/undressing: 3: Mod-Patient completed 50-74% of tasks  FIM - Toileting Toileting: 0: Activity did not occur     FIM - Banker Devices: Arm rests Bed/Chair Transfer: 3: Bed > Chair or W/C: Mod A (lift or lower assist);3: Chair or W/C > Bed: Mod A (lift or lower assist)  FIM - Locomotion: Wheelchair Distance: 100 Locomotion: Wheelchair: 2: Travels 50 - 149 ft with supervision, cueing or coaxing FIM - Locomotion: Ambulation Ambulation/Gait Assistance: Not tested (comment) Locomotion: Ambulation: 0: Activity did not occur  Comprehension Comprehension  Mode: Auditory Comprehension: 5-Follows basic conversation/direction: With extra time/assistive device  Expression Expression Mode: Verbal Expression: 5-Expresses complex 90% of the time/cues < 10% of the time  Social  Interaction Social Interaction: 4-Interacts appropriately 75 - 89% of the time - Needs redirection for appropriate language or to initiate interaction.  Problem Solving Problem Solving: 4-Solves basic 75 - 89% of the time/requires cueing 10 - 24% of the time  Memory Memory: 5-Recognizes or recalls 90% of the time/requires cueing < 10% of the time  Medical Problem List and Plan:  1. DVT Prophylaxis/Anticoagulation: Pharmaceutical: Lovenox  2. Pain Management: Continue prn oxycodone. Pain control adequate at present per pt 3. Mood: Noted to have flat affect. Will monitor for now. Provide ego support. LCSW to follow for evaluation.  4. Neuropsych: This patient is capable of making decisions on his own behalf.  5. DM type 2: Will monitor BS with ac/hs cbg checks. Continue Lantus 20 units and use SSI for elevated BS.   -sugars controlled at present 6. Anemia of chronic illness: Recheck 8.6---follow up Thursday  -no active signs of bleeding at present  -heme check stools 7. HTN: Monitor with bid checks. BP poorly controlled.  8. Urinary retention: Continue Flomax. UA revealed a few yeast..,ucx not done?---re-order  9. Constipation: scheduled miralax 10. Confusion: New in past 48 hours--reports insomnia.. Schedule trazodone 11. hypokalemia--replace  LOS (Days) 2 A FACE TO FACE EVALUATION WAS PERFORMED  Shadara Lopez T 08/23/2012 9:46 AM

## 2012-08-23 NOTE — Progress Notes (Signed)
Foley catheter D/C'd per MD order at 11:00, bladder scan performed @ 1645 56cc in bladder, RN encouraged pt to drink fluids, will continue to monitor

## 2012-08-24 ENCOUNTER — Inpatient Hospital Stay (HOSPITAL_COMMUNITY): Payer: Self-pay | Admitting: *Deleted

## 2012-08-24 ENCOUNTER — Inpatient Hospital Stay (HOSPITAL_COMMUNITY): Payer: Self-pay | Admitting: Occupational Therapy

## 2012-08-24 LAB — CBC
HCT: 24.2 % — ABNORMAL LOW (ref 39.0–52.0)
Hemoglobin: 8.3 g/dL — ABNORMAL LOW (ref 13.0–17.0)
MCH: 26.9 pg (ref 26.0–34.0)
Platelets: 141 10*3/uL — ABNORMAL LOW (ref 150–400)
RBC: 3.09 MIL/uL — ABNORMAL LOW (ref 4.22–5.81)
WBC: 3.6 10*3/uL — ABNORMAL LOW (ref 4.0–10.5)

## 2012-08-24 LAB — URINE CULTURE: Colony Count: NO GROWTH

## 2012-08-24 LAB — GLUCOSE, CAPILLARY
Glucose-Capillary: 118 mg/dL — ABNORMAL HIGH (ref 70–99)
Glucose-Capillary: 117 mg/dL — ABNORMAL HIGH (ref 70–99)
Glucose-Capillary: 207 mg/dL — ABNORMAL HIGH (ref 70–99)

## 2012-08-24 NOTE — Progress Notes (Signed)
Recreational Therapy Session Note  Patient Details  Name: Lawrence [name] MRN: [id number] Date of Birth: 22-Feb-1960 Today's Date: 08/24/2012 Time:  5366-4403 Pain: no c/o Skilled Therapeutic Interventions/Progress Updates: Session focused on activity tolerance, UE strengthening, & w/c mobility.  Pt propelled w/c on unit from room to gym using BUE's with supervision/min cues for technique and 2 rest breaks.  Encouraged pt to perform w/c mobility on unit in evenings to work on increasing his activity tolerance.  Pt agreeable to try as tolerated.    Therapy/Group: Individual Therapy   [name] 08/24/2012, 3:44 PM

## 2012-08-24 NOTE — Progress Notes (Addendum)
Physical Therapy Session Note  Patient Details  Name: Lawrence [name] MRN: [id number] Date of Birth: 12-05-1960  Today's Date: 08/24/2012 Time: 4098-1191 and 4782-9562 Time Calculation (min): 57 min and 30 min  Short Term Goals: Week 1:  PT Short Term Goal 1 (Week 1): Patient will perform bed mobility with min assist. PT Short Term Goal 2 (Week 1): Patient will perform lateral/scoot or slide board transfers with min assist. PT Short Term Goal 3 (Week 1): Patient will propel wheelchair 150' with supervision.  Skilled Therapeutic Interventions/Progress Updates:    AM Session: Patient received sitting in wheelchair. Session focused on wheelchair mobility, bed mobility on flat mat, and LE strengthening. See details below for wheelchair mobility. In supine: B HS stretch 2 x30" each, quad sets with 5" hold, L LE bridges, SLR, hip adduction with 5" ball squeeze; In sidelying: R hip abduction, R hip extension; In prone R hip extension; In sitting: R knee extension with 5" hold; All x20 reps. Patient able to transition supine>sidelying>prone and back with supervision on flat mat without rails. Patient provided with BKA exercise packet and each exercise explained and demonstrated, patient able to return demonstrate. Patient returned to room and left seated in wheelchair with all needs within reach.  PM Session: Patient received sitting in wheelchair. Session focused on wheelchair mobility (x150' with supervision) and activity tolerance. Patient exercised on NuStep Level 3 x8' with B UE and L LE to increase activity tolerance, strength, and coordination. Patient returned to room and left seated in wheelchair with all needs within reach.  Therapy Documentation Precautions:  Precautions Precautions: Fall Precaution Comments: R BKA Restrictions Weight Bearing Restrictions: Yes RLE Weight Bearing: Non weight bearing Pain: Pain Assessment Pain Assessment: No/denies pain Pain Score: 0-No pain Locomotion  : Ambulation Ambulation/Gait Assistance: Not tested (comment) Wheelchair Mobility Distance: 125   See FIM for current functional status  Therapy/Group: Individual Therapy  Chipper Herb. Naftula Donahue, PT, DPT  08/24/2012, 11:30 AM

## 2012-08-24 NOTE — Progress Notes (Signed)
Pt unable to void after 8 hours; reports he feels urge to void but cannot do so. I&O cath at 1430 for 400cc. One small blood clot noted in urine. Noted swelling to penis and unable to retract foreskin completely for cath. PA notified and she advised to have pt or RN take washcloth and try to reduce the swelling by pulling the foreskin back gradually. LBM 7/9 and pt continues to refuse laxative despite education regarding effects on bladder, etc. Continue to monitor. Mick Sell RN

## 2012-08-24 NOTE — Progress Notes (Signed)
Occupational Therapy Session Note  Patient Details  Name: Lawrence [name] MRN: [id number] Date of Birth: April 19, 1960  Today's Date: 08/24/2012 Time: 7829-5621 Time Calculation (min): 45 min  Short Term Goals: Week 1:  OT Short Term Goal 1 (Week 1): Pt will complete toilet transfer with min assist OT Short Term Goal 2 (Week 1): Pt will complete LB dressing with min assist OT Short Term Goal 3 (Week 1): Pt will complete toilet task with min assist OT Short Term Goal 4 (Week 1): Pt will tolerate standing to complete 1 grooming task  Skilled Therapeutic Interventions/Progress Updates:    Pt propelled his wheelchair 1/2 way to the gym with close supervision and mod instructional cueing for correct technique to make turns.  Pt worked on sit to stand intervals while at the high/low table, once in the gym.  He continues to need mod instructional cueing for sequencing to perform sit to stand including correct LLE placement and hand placement.  In standing the pt worked on releasing his UEs for removing pegs from peg board.  Alternated each hand with pt needing min assist to maintain standing balance when reaching with the left hand and mod assist to maintain balance when releasing to use the right hand.  Pt able to maintain standing for 1-2 min intervals this pm and required overall total assist to achieve standing from the sitting position.    Therapy Documentation Precautions:  Precautions Precautions: Fall Precaution Comments: R BKA Required Braces or Orthoses:  ( ) Restrictions Weight Bearing Restrictions: Yes RLE Weight Bearing: Non weight bearing  Pain:   No report of pain during session  Therapy/Group: Individual Therapy  Arthella Headings OTR/L 08/24/2012, 3:25 PM

## 2012-08-24 NOTE — Progress Notes (Signed)
Subjective/Complaints: Still no bm. Slept well. Foley out A 12 point review of systems has been performed and if not noted above is otherwise negative. .  Objective: Vital Signs: Blood pressure 163/80, pulse 94, temperature 98.5 F (36.9 C), temperature source Oral, resp. rate 18, weight 75 kg (165 lb 5.5 oz), SpO2 98.00%. No results found.  Recent Labs  08/22/12 0640 08/24/12 0600  WBC 5.2 3.6*  HGB 8.6* 8.3*  HCT 25.2* 24.2*  PLT 136* 141*    Recent Labs  08/22/12 0640  NA 139  K 3.4*  CL 106  GLUCOSE 96  BUN 24*  CREATININE 0.99  CALCIUM 9.5   CBG (last 3)   Recent Labs  08/23/12 1135 08/23/12 1623 08/23/12 2108  GLUCAP 183* 135* 163*    Wt Readings from Last 3 Encounters:  08/22/12 75 kg (165 lb 5.5 oz)  08/21/12 74.3 kg (163 lb 12.8 oz)  08/21/12 74.3 kg (163 lb 12.8 oz)    Physical Exam:  Constitutional: He is oriented to person, place, and time. He appears well-nourished. Mild disorientation noted.  HENT:  Head: Normocephalic and atraumatic.  Eyes: Pupils are equal, round, and reactive to light.  Neck: Normal range of motion. Neck supple.  Cardiovascular: Normal rate and regular rhythm.  No murmur heard.  Pulmonary/Chest: Effort normal and breath sounds normal. No respiratory distress. He has no wheezes.  Abdominal: Soft. Bowel sounds are normal. He exhibits no distension. There is no tenderness.  Musculoskeletal: He exhibits edema, 1 to 2+ both legs.   BUE with muscle wasting distally. Left foot with 2+edema to knees. Right bk incision was clean and intact.  Neurological: He is alert and oriented to person, place, and time. Intact basic insight and awareness.  Skin: Skin is warm and dry.  Psychiatric: His speech is normal. His affect is blunt. He is withdrawn.  motor strength is 4-5/5 in bilateral deltoid, bicep, tricep, grip. 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 Functional deficits secondary to right bka which require 3+ hours per day of interdisciplinary therapy in a comprehensive inpatient rehab setting. Physiatrist is providing close team supervision and 24 hour management of active medical problems listed below. Physiatrist and rehab team continue to assess barriers to discharge/monitor patient progress toward functional and medical goals. FIM: FIM - Bathing Bathing Steps Patient Completed: Chest;Right Arm;Left Arm;Abdomen Bathing: 3: Mod-Patient completes 5-7 59f 10 parts or 50-74%  FIM - Upper Body Dressing/Undressing Upper body dressing/undressing steps patient completed: Put head through opening of pull over shirt/dress;Thread/unthread left sleeve of pullover shirt/dress;Thread/unthread right sleeve of pullover shirt/dresss;Pull shirt over trunk Upper body dressing/undressing: 5: Supervision: Safety issues/verbal cues FIM - Lower Body Dressing/Undressing Lower body dressing/undressing steps patient completed: Thread/unthread right pants leg;Thread/unthread left pants leg;Pull pants up/down;Don/Doff left sock;Don/Doff left shoe;Fasten/unfasten left shoe Lower body dressing/undressing: 3: Mod-Patient completed 50-74% of tasks  FIM - Toileting Toileting: 0: Activity did not occur     FIM - Banker Devices: Arm rests Bed/Chair Transfer: 3: Supine > Sit: Mod A (lifting assist/Pt. 50-74%/lift 2 legs;4: Sit > Supine: Min A (steadying pt. > 75%/lift 1 leg);3: Chair or W/C > Bed: Mod A (lift or lower assist)  FIM - Locomotion: Wheelchair Distance: 100 Locomotion: Wheelchair: 2: Travels 50 - 149 ft with minimal assistance (Pt.>75%) FIM -  Locomotion: Ambulation Ambulation/Gait Assistance: Not tested (comment) Locomotion: Ambulation: 0: Activity did not occur  Comprehension Comprehension Mode: Auditory Comprehension:  5-Follows basic conversation/direction: With extra time/assistive device  Expression Expression Mode: Verbal Expression: 5-Expresses complex 90% of the time/cues < 10% of the time  Social Interaction Social Interaction: 4-Interacts appropriately 75 - 89% of the time - Needs redirection for appropriate language or to initiate interaction.  Problem Solving Problem Solving: 4-Solves basic 75 - 89% of the time/requires cueing 10 - 24% of the time  Memory Memory: 5-Recognizes or recalls 90% of the time/requires cueing < 10% of the time  Medical Problem List and Plan:  1. DVT Prophylaxis/Anticoagulation: Pharmaceutical: Lovenox  2. Pain Management: Continue prn oxycodone. Pain control adequate at present per pt 3. Mood: Noted to have flat affect. Will monitor for now. Provide ego support. LCSW to follow for evaluation.  4. Neuropsych: This patient is capable of making decisions on his own behalf.  5. DM type 2: Will monitor BS with ac/hs cbg checks. Continue Lantus 20 units and use SSI for elevated BS.   -sugars controlled at present 6. Anemia of chronic illness: Recheck 8.3---follow up Friday  -no active signs of bleeding at present  -heme check stools--not done 7. HTN: Monitor with bid checks. BP poorly controlled.  8. Urinary retention: Continue Flomax. UA revealed a few yeast..,ucx negative 9. Constipation: scheduled miralax. Needs bm--speak with RN 10. Confusion: New in past 48 hours--reports insomnia.. Schedule trazodone 11. hypokalemia--replace  LOS (Days) 3 A FACE TO FACE EVALUATION WAS PERFORMED  SWARTZ,ZACHARY T 08/24/2012 10:23 AM

## 2012-08-24 NOTE — Progress Notes (Signed)
NUTRITION FOLLOW UP  Intervention:   1.  Supplements; Prostat once daily.  2.  Nutrition-related meds; continue to offer laxatives.   Nutrition Dx:   Increased nutrient needs, ongoing.  Monitor:   1. Food/Beverage; pt meeting >/=90% estimated needs with tolerance. Met, ongoing.  2. Wt/wt change; monitor trends.  Ongoing.   Assessment:   Pt admitted s/p BKA (7/9). Pt with variable CBGs.  Intake has returned to 75-100%. Recommend continue Regular diet with adequate glucose coverage as pt's intake is improved after diet change.  Will add Prostat once daily to assist in meeting protein needs.  Pt without BM.  Abdomen is distended.  Not accepting laxatives.   Height: Ht Readings from Last 1 Encounters:  08/21/12 5\' 5"  (1.651 m)    Weight Status:   Wt Readings from Last 1 Encounters:  08/22/12 165 lb 5.5 oz (75 kg)    Re-estimated needs:  Kcal: 2050-2350 Protein: 90-105g Fluid: >1.8 L/day  Skin: incision  Diet Order: General   Intake/Output Summary (Last 24 hours) at 08/24/12 1055 Last data filed at 08/24/12 0900  Gross per 24 hour  Intake    480 ml  Output   1100 ml  Net   -620 ml    Last BM: 7/9, continues to refuse laxatives.   Labs:   Recent Labs Lab 08/20/12 1124 08/22/12 0640  NA 138 139  K 4.1 3.4*  CL 106 106  CO2 22 23  BUN 26* 24*  CREATININE 1.06 0.99  CALCIUM 9.3 9.5  GLUCOSE 149* 96    CBG (last 3)   Recent Labs  08/23/12 1135 08/23/12 1623 08/23/12 2108  GLUCAP 183* 135* 163*    Scheduled Meds: . aspirin  81 mg Oral Daily  . enoxaparin (LOVENOX) injection  40 mg Subcutaneous Q24H  . insulin aspart  0-9 Units Subcutaneous TID WC  . insulin glargine  20 Units Subcutaneous QHS  . metoprolol tartrate  25 mg Oral BID  . senna-docusate  2 tablet Oral QHS  . tamsulosin  0.4 mg Oral Daily  . traZODone  25 mg Oral QHS    Continuous Infusions:   Loyce Dys, MS RD LDN Clinical Inpatient Dietitian Pager:  [phone] Weekend/After hours pager: [phone]

## 2012-08-24 NOTE — Progress Notes (Signed)
Occupational Therapy Session Note  Patient Details  Name: DEROY NOAH MRN: [id number] Date of Birth: 31-Jan-1961  Today's Date: 08/24/2012 Time: 0800-0901 Time Calculation (min): 61 min  Short Term Goals: Week 1:  OT Short Term Goal 1 (Week 1): Pt will complete toilet transfer with min assist OT Short Term Goal 2 (Week 1): Pt will complete LB dressing with min assist OT Short Term Goal 3 (Week 1): Pt will complete toilet task with min assist OT Short Term Goal 4 (Week 1): Pt will tolerate standing to complete 1 grooming task  Skilled Therapeutic Interventions/Progress Updates:    Pt worked on bathing and dressing sit to stand at the sink.   He was able to perform all UB selfcare with supervision but needs max assist for LB selfcare secondary to sit to stand.  Pt overall needs mod instructional cueing for setup and placement of UEs and his left foot prior to standing.  Still demonstrates significant difficulty with this task.  Once standing he is able to maintain for washing peri area with overall min assist.  Currently unable to thoroughly reach his peri area secondary to his arm length.     Therapy Documentation Precautions:  Precautions Precautions: Fall Precaution Comments: R BKA Required Braces or Orthoses:  ( ) Restrictions Weight Bearing Restrictions: Yes RLE Weight Bearing: Non weight bearing  Pain: Pain Assessment Pain Assessment: No/denies pain Pain Score: 0-No pain ADL: See FIM for current functional status  Therapy/Group: Individual Therapy  Katisha Shimizu OTR/L 08/24/2012, 12:16 PM

## 2012-08-25 ENCOUNTER — Inpatient Hospital Stay (HOSPITAL_COMMUNITY): Payer: Self-pay | Admitting: Occupational Therapy

## 2012-08-25 ENCOUNTER — Inpatient Hospital Stay (HOSPITAL_COMMUNITY): Payer: Self-pay | Admitting: *Deleted

## 2012-08-25 DIAGNOSIS — I739 Peripheral vascular disease, unspecified: Secondary | ICD-10-CM

## 2012-08-25 DIAGNOSIS — I10 Essential (primary) hypertension: Secondary | ICD-10-CM

## 2012-08-25 DIAGNOSIS — E1165 Type 2 diabetes mellitus with hyperglycemia: Secondary | ICD-10-CM

## 2012-08-25 DIAGNOSIS — L98499 Non-pressure chronic ulcer of skin of other sites with unspecified severity: Secondary | ICD-10-CM

## 2012-08-25 DIAGNOSIS — S88119A Complete traumatic amputation at level between knee and ankle, unspecified lower leg, initial encounter: Secondary | ICD-10-CM

## 2012-08-25 DIAGNOSIS — IMO0001 Reserved for inherently not codable concepts without codable children: Secondary | ICD-10-CM

## 2012-08-25 DIAGNOSIS — D62 Acute posthemorrhagic anemia: Secondary | ICD-10-CM

## 2012-08-25 LAB — GLUCOSE, CAPILLARY: Glucose-Capillary: 145 mg/dL — ABNORMAL HIGH (ref 70–99)

## 2012-08-25 MED ORDER — SORBITOL 70 % SOLN
60.0000 mL | Status: AC
  Administered 2012-08-25: 60 mL via ORAL
  Filled 2012-08-25 (×2): qty 30

## 2012-08-25 MED ORDER — BISACODYL 10 MG RE SUPP: 10.0000 mg | Freq: Every day | RECTAL | Status: DC | PRN

## 2012-08-25 NOTE — Progress Notes (Signed)
Physical Therapy Session Note  Patient Details  Name: NISAIAH BECHTOL MRN: [id number] Date of Birth: [birth year]  Today's Date: 08/25/2012 Time: 0900-1000 Time Calculation (min): 60 min  Short Term Goals: Week 1:  PT Short Term Goal 1 (Week 1): Patient will perform bed mobility with min assist. PT Short Term Goal 2 (Week 1): Patient will perform lateral/scoot or slide board transfers with min assist. PT Short Term Goal 3 (Week 1): Patient will propel wheelchair 150' with supervision.  Skilled Therapeutic Interventions/Progress Updates:    Patient received sitting in wheelchair. Session focused on wheelchair mobility, transfers with slideboard, dynamic sitting balance, and strengthening. Patient completed wheelchair obstacle course x2 requiring him to weave in/out of 3 cones and around a wide bolster. Patient able to complete with supervision and min verbal cues for obstacle negotiation and wheelchair technique.  Patient agreeable to use of slide board secondary to catheter being removed; performed block practice slide board transfers x4 (going to B sides) with min assist (on first trial) progressing to supervision/set up assist required for placement of slide board. Emphasis on proper set up prior to transfer (close position of wheelchair, removal of arm rest and leg rests, locking of brakes). Patient performed 2 sets x10 reps of seated push ups with use of push up blocks. Emphasis on use of L LE to assist in pushing up to clear glutes. Seated edge of mat, patient performed B LE extension with 5" hold x20 reps.  Patient returned to room and left seated in wheelchair with all needs within reach.  Therapy Documentation Precautions:  Precautions Precautions: Fall Precaution Comments: R BKA Restrictions Weight Bearing Restrictions: Yes RLE Weight Bearing: Non weight bearing Pain: Pain Assessment Pain Assessment: No/denies pain Pain Score: 0-No pain Mobility: Transfers Lateral/Scoot  Transfers: 4: Min assist;With armrests removed;With slide board Lateral/Scoot Transfer Details: Verbal cues for sequencing;Manual facilitation for weight shifting;Tactile cues for sequencing;Verbal cues for precautions/safety;Verbal cues for technique Locomotion : Ambulation Ambulation: No Gait Gait: No Stairs / Additional Locomotion Stairs: No Corporate treasurer: Yes Wheelchair Assistance: 5: Financial planner Details: Verbal cues for precautions/safety;Verbal cues for Diplomatic Services operational officer: Both upper extremities Wheelchair Parts Management: Supervision/cueing Distance: 160   See FIM for current functional status  Therapy/Group: Individual Therapy  Chipper Herb. Divine Hansley, PT, DPT 08/25/2012, 12:19 PM

## 2012-08-25 NOTE — Progress Notes (Signed)
Subjective/Complaints: Still no bm. Slept well again. Recognizes that he needs a bm. A 12 point review of systems has been performed and if not noted above is otherwise negative. .  Objective: Vital Signs: Blood pressure 128/69, pulse 71, temperature 97.6 F (36.4 C), temperature source Oral, resp. rate 18, weight 75 kg (165 lb 5.5 oz), SpO2 97.00%. No results found.  Recent Labs  08/24/12 0600  WBC 3.6*  HGB 8.3*  HCT 24.2*  PLT 141*   No results found for this basename: NA, K, CL, CO, GLUCOSE, BUN, CREATININE, CALCIUM,  in the last 72 hours CBG (last 3)   Recent Labs  08/24/12 1635 08/24/12 2045 08/25/12 0730  GLUCAP 117* 207* 101*    Wt Readings from Last 3 Encounters:  08/22/12 75 kg (165 lb 5.5 oz)  08/21/12 74.3 kg (163 lb 12.8 oz)  08/21/12 74.3 kg (163 lb 12.8 oz)    Physical Exam:  Constitutional: He is oriented to person, place, and time. He appears well-nourished. Mild disorientation noted.  HENT:  Head: Normocephalic and atraumatic.  Eyes: Pupils are equal, round, and reactive to light.  Neck: Normal range of motion. Neck supple.  Cardiovascular: Normal rate and regular rhythm.  No murmur heard.  Pulmonary/Chest: Effort normal and breath sounds normal. No respiratory distress. He has no wheezes.  Abdominal: Soft. Bowel sounds are normal. He exhibits no distension. There is no tenderness.  Musculoskeletal: He exhibits edema, 1 to 2+ both legs.   BUE with muscle wasting distally. Left foot with 2+edema to knees. Right bk incision was clean and intact.  Neurological: He is alert and oriented to person, place, and time. Intact basic insight and awareness.  Skin: Skin is warm and dry.  Psychiatric: His speech is normal. His affect is blunt. He is withdrawn.  motor strength is 4-5/5 in bilateral deltoid, bicep, tricep, grip. 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 Functional deficits secondary to right bka which require 3+ hours per day of interdisciplinary therapy in a comprehensive inpatient rehab setting. Physiatrist is providing close team supervision and 24 hour management of active medical problems listed below. Physiatrist and rehab team continue to assess barriers to discharge/monitor patient progress toward functional and medical goals. FIM: FIM - Bathing Bathing Steps Patient Completed: Chest;Right Arm;Left Arm;Abdomen Bathing: 3: Mod-Patient completes 5-7 16f 10 parts or 50-74%  FIM - Upper Body Dressing/Undressing Upper body dressing/undressing steps patient completed: Put head through opening of pull over shirt/dress;Thread/unthread left sleeve of pullover shirt/dress;Thread/unthread right sleeve of pullover shirt/dresss;Pull shirt over trunk Upper body dressing/undressing: 5: Supervision: Safety issues/verbal cues FIM - Lower Body Dressing/Undressing Lower body dressing/undressing steps patient completed: Thread/unthread right pants leg;Thread/unthread left pants leg;Pull pants up/down;Don/Doff left sock;Don/Doff left shoe;Fasten/unfasten left shoe Lower body dressing/undressing: 3: Mod-Patient completed 50-74% of tasks  FIM - Toileting Toileting: 0: Activity did not occur     FIM - Banker Devices: Arm rests Bed/Chair Transfer: 1: Two helpers (required 2 assist when fatigued)  FIM - Locomotion: Wheelchair Distance: 125 Locomotion: Wheelchair: 2: Travels 50 - 149 ft with supervision, cueing or coaxing FIM - Locomotion: Ambulation Ambulation/Gait Assistance: Not tested (comment) Locomotion: Ambulation: 0: Activity did not occur  Comprehension Comprehension Mode: Auditory Comprehension: 5-Understands complex 90% of the time/Cues < 10% of the time  Expression Expression  Mode: Verbal Expression: 5-Expresses complex 90% of the  time/cues < 10% of the time  Social Interaction Social Interaction: 5-Interacts appropriately 90% of the time - Needs monitoring or encouragement for participation or interaction.  Problem Solving Problem Solving: 5-Solves basic 90% of the time/requires cueing < 10% of the time  Memory Memory: 5-Recognizes or recalls 90% of the time/requires cueing < 10% of the time  Medical Problem List and Plan:  1. DVT Prophylaxis/Anticoagulation: Pharmaceutical: Lovenox  2. Pain Management: Continue prn oxycodone. Pain control adequate at present per pt 3. Mood: Noted to have flat affect. Will monitor for now. Provide ego support. LCSW to follow for evaluation.  4. Neuropsych: This patient is capable of making decisions on his own behalf.  5. DM type 2: Will monitor BS with ac/hs cbg checks. Continue Lantus 20 units and use SSI for elevated BS.   -sugars controlled at present 6. Anemia of chronic illness: Recheck 8.3---follow up labs pending  -no active signs of bleeding at present  -heme check stools--not done 7. HTN: Monitor with bid checks. BP poorly controlled.  8. Urinary retention: Continue Flomax. UA revealed a few yeast..,ucx negative 9. Constipation: scheduled miralax. Sorbitol today with supp/enema as back up 10. Confusion: New in past 48 hours--reports insomnia.. Schedule trazodone 11. hypokalemia--replacing  LOS (Days) 4 A FACE TO FACE EVALUATION WAS PERFORMED  SWARTZ,ZACHARY T 08/25/2012 8:55 AM

## 2012-08-25 NOTE — Progress Notes (Signed)
Per report patient LBM 7/9. Patient continues to decline laxative despite extensive education given regarding the effects pain medication can have on bowels as well as the effects constipation can have on the bladder. Patient states he will eventually go on his own, he will not take anything. Patient unable to void requires in and out cath every 6-8 hours. Will continue to monitor.

## 2012-08-25 NOTE — Progress Notes (Signed)
Pt refusing sorbitol and any other stool softner/laxatives, RN provided education on the importance of emptying his bowels, Pam Love PA made aware of pt reusing medications

## 2012-08-25 NOTE — Progress Notes (Signed)
Occupational Therapy Session Note  Patient Details  Name: Lawrence [name] MRN: [id number] Date of Birth: 1961/01/02  Today's Date: 08/25/2012 Time: 7829-5621 Time Calculation (min): 29 min  Skilled Therapeutic Interventions/Progress Updates:    Pt worked on UE strengthening with use of the UE ergonometer.  Pt performed 4 sets of 3 minute intervals with resistance set on level 8 for the 1st and 2nd set with pt peddling forward.  For the 3rd set performed another 3 mins peddling in reverse and resistance at level 10.  Completed UE strengthening with 4th set and resistance at level 15.  Pt needing mod instructional cueing for completion of 3 mins secondary to fatigue.  Encouraged pt to perform UE therex in the room over the weekend, especially tricep extension to assist with sit to stand and sliding board transfers.  Therapy Documentation Precautions:  Precautions Precautions: Fall Precaution Comments: R BKA Required Braces or Orthoses:  ( ) Restrictions Weight Bearing Restrictions: Yes RUE Weight Bearing: Weight bearing as tolerated LUE Weight Bearing: Weight bearing as tolerated RLE Weight Bearing: Non weight bearing LLE Weight Bearing: Weight bearing as tolerated  Vital Signs: Therapy Vitals Temp: 98.3 F (36.8 C) Temp src: Oral Pulse Rate: 77 Resp: 20 BP: 159/65 mmHg Oxygen Therapy SpO2: 100 % Pain: Pain Assessment Pain Assessment: 0-10 Pain Score: 6  Pain Type: Surgical pain Pain Location: Leg Pain Orientation: Right Pain Descriptors / Indicators: Aching Pain Frequency: Constant Pain Onset: On-going Patients Stated Pain Goal: 1 Pain Intervention(s): Medication (See eMAR) ADL: See FIM for current functional status  Therapy/Group: Individual Therapy  Christop Hippert OTR/L 08/25/2012, 4:01 PM

## 2012-08-25 NOTE — Progress Notes (Addendum)
Occupational Therapy Session Note  Patient Details  Name: Lawrence [name] MRN: [id number] Date of Birth: 16-Feb-1960  Today's Date: 08/25/2012 Time: 0800-0900 Time Calculation (min): 60 min  Session 2: Time:  10:02-11:00 Time Calculation (min) (58 min)  Short Term Goals: Week 1:  OT Short Term Goal 1 (Week 1): Pt will complete toilet transfer with min assist OT Short Term Goal 2 (Week 1): Pt will complete LB dressing with min assist OT Short Term Goal 3 (Week 1): Pt will complete toilet task with min assist OT Short Term Goal 4 (Week 1): Pt will tolerate standing to complete 1 grooming task  Skilled Therapeutic Interventions/Progress Updates:    Session 1:  Pt declined bathing and dressing session this am so worked on squat pivot transfers from bed to wheelchair and wheelchair to and from the drop arm commode.  Pt needs max assist for squat pivot transfers to the left and total assist to the right.  Pt demonstrates decreased strength as well as efficient utilization of his UEs during the transfers.  He also demonstrates decreased forward weightshift with transitional movements as well.  Also worked on sit to stand for Journalist, newspaper.  Pt able to achieve 80% of full stand with therapist in front of him supporting, but unable to maintain standing long enough to manage clothing.  Transferred wheelchair in front of the sink for standing to manage clothing with max assist.    Session 2:   Worked on sliding board transfers from wheelchair to mat.  Pt able to perform with min assist after board setup and placement.  Needs min instructional cueing for efficient technique to scoot across the board.  While on the therapy mat worked on transitions from sidelying to sitting on both the left and right sides to help increase strength in his arms and to assist with bed mobility independence.  Pt needing min assist to sit up from the right sidelying position and mod assist from the left.  Pt  reporting pain in the lower left quadrant of his back so therapist performed stretches in lateral trunk elongation as well as rotation.  Pt reported a decrease in pain after performing this.  Finished session by having pt use the orange theraband and performing 2 sets of 20 reps for tricep extension in each UE.    Therapy Documentation Precautions:  Precautions Precautions: Fall Precaution Comments: R BKA Required Braces or Orthoses:  ( ) Restrictions Weight Bearing Restrictions: Yes RUE Weight Bearing: Weight bearing as tolerated LUE Weight Bearing: Weight bearing as tolerated RLE Weight Bearing: Non weight bearing LLE Weight Bearing: Weight bearing as tolerated  Pain: Pain Assessment Pain Assessment: No/denies pain Pain Score: 0-No pain  See FIM for current functional status  Therapy/Group: Individual Therapy  Carolee Channell OTR/L 08/25/2012, 12:10 PM

## 2012-08-26 ENCOUNTER — Encounter (HOSPITAL_COMMUNITY): Payer: Self-pay | Admitting: Occupational Therapy

## 2012-08-26 ENCOUNTER — Inpatient Hospital Stay (HOSPITAL_COMMUNITY): Payer: Self-pay | Admitting: *Deleted

## 2012-08-26 LAB — GLUCOSE, CAPILLARY
Glucose-Capillary: 110 mg/dL — ABNORMAL HIGH (ref 70–99)
Glucose-Capillary: 148 mg/dL — ABNORMAL HIGH (ref 70–99)
Glucose-Capillary: 189 mg/dL — ABNORMAL HIGH (ref 70–99)

## 2012-08-26 LAB — CBC
HCT: 25.9 % — ABNORMAL LOW (ref 39.0–52.0)
Hemoglobin: 8.7 g/dL — ABNORMAL LOW (ref 13.0–17.0)
RBC: 3.31 MIL/uL — ABNORMAL LOW (ref 4.22–5.81)
WBC: 5.4 10*3/uL (ref 4.0–10.5)

## 2012-08-26 LAB — OCCULT BLOOD X 1 CARD TO LAB, STOOL: Fecal Occult Bld: NEGATIVE

## 2012-08-26 MED ORDER — POLYETHYLENE GLYCOL 3350 17 G PO PACK
17.0000 g | PACK | Freq: Every day | ORAL | Status: DC
  Administered 2012-08-27 – 2012-09-01 (×6): 17 g via ORAL
  Filled 2012-08-26 (×8): qty 1

## 2012-08-26 NOTE — Progress Notes (Addendum)
Physical Therapy Session Note  Patient Details  Name: Lawrence [name] MRN: [id number] Date of Birth: Aug 20, 1960  Today's Date: 08/26/2012 Time: 1345-1430 Time Calculation (min): 45 min; missed 60 minutes in AM  Short Term Goals: Week 1:  PT Short Term Goal 1 (Week 1): Patient will perform bed mobility with min assist. PT Short Term Goal 2 (Week 1): Patient will perform lateral/scoot or slide board transfers with min assist. PT Short Term Goal 3 (Week 1): Patient will propel wheelchair 150' with supervision.  Skilled Therapeutic Interventions/Progress Updates:    AM Session: Patient missed 60 minutes of skilled physical therapy this AM secondary to fatigue and refusal to participate. Patient reports he did not sleep at all last night due to attempts to move his bowels, which was ultimately successful. Attempted to engage patient in bed level LE therex, however, patient reports he is exhausted and would like to rest. Notified patient of therapy session scheduled this PM and patient agreeable to participate then. Patient left supine in bed with all needs within reach.  PM Session: Patient received sitting in wheelchair. Patient participated in exercise group with focus on UE and LE strengthening and wheelchair mobility. Patient performed sitting LE exercises: (2sets x20 reps each) knee flex/ext, L ankle pumps, hip abduction with blue theraband, hip add with pillow squeeze. Patient used L LE to propel wheelchair x60' for L hamstring strengthening. See details below for wheelchair mobility. Patient returned to room and left seated in wheelchair with all needs within reach.  Therapy Documentation Precautions:  Precautions Precautions: Fall Precaution Comments: R BKA Restrictions Weight Bearing Restrictions: Yes RLE Weight Bearing: Non weight bearing General: Amount of Missed PT Time (min): 60 Minutes Missed Time Reason: Patient fatigue;Patient unwilling/refused to participate without medical  reason (Pt did not sleep last night secondary to moving bowels) Pain: Pain Assessment Pain Assessment: No/denies pain Pain Score: 0-No pain Locomotion : Ambulation Ambulation: No Ambulation/Gait Assistance: Not tested (comment) Gait Gait: No Stairs / Additional Locomotion Stairs: No Wheelchair Mobility Wheelchair Mobility: Yes Wheelchair Assistance: 5: Financial planner Details: Verbal cues for precautions/safety;Verbal cues for Diplomatic Services operational officer: Both upper extremities Wheelchair Parts Management: Needs assistance Distance: 90   See FIM for current functional status  Therapy/Group: Group Therapy  [employer]. Roma Bondar, PT, DPT 08/26/2012, 2:43 PM

## 2012-08-26 NOTE — Progress Notes (Signed)
Occupational Therapy Session Note  Patient Details  Name: Lawrence [name] MRN: [id number] Date of Birth: 08-27-1960  Today's Date: 08/26/2012 Time:  -   Patient stated he was too fatigued to complete OT ADL therapy  this am and that he had not slept all night after having BMs all night.   General Amount of Missed OT Time (min): 45 Minutes (45 min) Bud Face [facility] 08/26/2012, 12:10 PM

## 2012-08-26 NOTE — Plan of Care (Signed)
Problem: RH BOWEL ELIMINATION Goal: RH STG MANAGE BOWEL W/MEDICATION W/ASSISTANCE STG Manage Bowel with Medication mod Assistance.  Outcome: Progressing Patient had  Extra large BM @ 230 am 08/26/12

## 2012-08-26 NOTE — Progress Notes (Signed)
Subjective/Complaints: Didn'[name] sleep as well. Did have two large BM's yesterday evening. A 12 point review of systems has been performed and if not noted above is otherwise negative. .  Objective: Vital Signs: Blood pressure 115/69, pulse 90, temperature 98.1 F (36.7 C), temperature source Oral, resp. rate 18, weight 75 kg (165 lb 5.5 oz), SpO2 100.00%. No results found.  Recent Labs  08/24/12 0600 08/26/12 0500  WBC 3.6* 5.4  HGB 8.3* 8.7*  HCT 24.2* 25.9*  PLT 141* 140*   No results found for this basename: NA, K, CL, CO, GLUCOSE, BUN, CREATININE, CALCIUM,  in the last 72 hours CBG (last 3)   Recent Labs  08/25/12 1738 08/25/12 2213 08/26/12 0743  GLUCAP 171* 145* 110*    Wt Readings from Last 3 Encounters:  08/22/12 75 kg (165 lb 5.5 oz)  08/21/12 74.3 kg (163 lb 12.8 oz)  08/21/12 74.3 kg (163 lb 12.8 oz)    Physical Exam:  Constitutional: He is oriented to person, place, and time. He appears well-nourished. Mild disorientation noted.  HENT:  Head: Normocephalic and atraumatic.  Eyes: Pupils are equal, round, and reactive to light.  Neck: Normal range of motion. Neck supple.  Cardiovascular: Normal rate and regular rhythm.  No murmur heard.  Pulmonary/Chest: Effort normal and breath sounds normal. No respiratory distress. He has no wheezes.  Abdominal: Soft. Bowel sounds are normal. He exhibits no distension. There is no tenderness.  Musculoskeletal: He exhibits edema, 1 to 2+ both legs.   BUE with muscle wasting distally. Left foot with 2+edema to knees. Right bk incision remains clean and intact.  Neurological: He is alert and oriented to person, place, and time. Intact basic insight and awareness.  Skin: Skin is warm and dry.  Psychiatric: His speech is normal. His affect is blunt. He is withdrawn.  motor strength is 4-5/5 in bilateral deltoid, bicep, tricep, grip. 3+ to 4/5 with Left, hip flexors, knee extensors, ankle dorsi flexion plantarflexion  R LE  with BKA has 3-/5 HF  Sensation reduced to sharp touch in both hands and left foot    Assessment/Plan: 1. Functional deficits secondary to right bka which require 3+ hours per day of interdisciplinary therapy in a comprehensive inpatient rehab setting. Physiatrist is providing close team supervision and 24 hour management of active medical problems listed below. Physiatrist and rehab team continue to assess barriers to discharge/monitor patient progress toward functional and medical goals. FIM: FIM - Bathing Bathing Steps Patient Completed: Chest;Right Arm;Left Arm;Abdomen Bathing: 3: Mod-Patient completes 5-7 25f 10 parts or 50-74%  FIM - Upper Body Dressing/Undressing Upper body dressing/undressing steps patient completed: Put head through opening of pull over shirt/dress;Thread/unthread left sleeve of pullover shirt/dress;Thread/unthread right sleeve of pullover shirt/dresss;Pull shirt over trunk Upper body dressing/undressing: 5: Supervision: Safety issues/verbal cues FIM - Lower Body Dressing/Undressing Lower body dressing/undressing steps patient completed: Thread/unthread right pants leg;Thread/unthread left pants leg;Pull pants up/down;Don/Doff left sock;Don/Doff left shoe;Fasten/unfasten left shoe Lower body dressing/undressing: 3: Mod-Patient completed 50-74% of tasks  FIM - Toileting Toileting steps completed by patient: Adjust clothing prior to toileting;Performs perineal hygiene;Adjust clothing after toileting Toileting: 1: Two helpers  FIM - Diplomatic Services operational officer Devices: Grab bars;Bedside commode Toilet Transfers: 2-To toilet/BSC: Max A (lift and lower assist);2-From toilet/BSC: Max A (lift and lower assist)  FIM - Banker Devices: Sliding board;Arm rests Bed/Chair Transfer: 0: Activity did not occur  FIM - Locomotion: Wheelchair Distance: 160 Locomotion: Wheelchair: 0: Activity did not occur  FIM -  Locomotion: Ambulation Ambulation/Gait Assistance: Not tested (comment) Locomotion: Ambulation: 0: Activity did not occur  Comprehension Comprehension Mode: Auditory Comprehension: 5-Understands complex 90% of the time/Cues < 10% of the time  Expression Expression Mode: Verbal Expression: 5-Expresses complex 90% of the time/cues < 10% of the time  Social Interaction Social Interaction: 5-Interacts appropriately 90% of the time - Needs monitoring or encouragement for participation or interaction.  Problem Solving Problem Solving: 5-Solves complex 90% of the time/cues < 10% of the time  Memory Memory: 5-Recognizes or recalls 90% of the time/requires cueing < 10% of the time  Medical Problem List and Plan:  1. DVT Prophylaxis/Anticoagulation: Pharmaceutical: Lovenox  2. Pain Management: Continue prn oxycodone. Pain control adequate at present per pt 3. Mood: Noted to have flat affect. Will monitor for now. Provide ego support. LCSW to follow for evaluation.  4. Neuropsych: This patient is capable of making decisions on his own behalf.  5. DM type 2: Will monitor BS with ac/hs cbg checks. Continue Lantus 20 units and use SSI for elevated BS.   -sugars controlled at present 6. Anemia of chronic illness: Recheck 8.7 yesterday  -recheck next week  -no active signs of bleeding at present  -heme check stools--negative 7. HTN: Monitor with bid checks. BP poorly controlled.  8. Urinary retention: Continue Flomax. UA revealed a few yeast..,ucx negative 9. Constipation: scheduled miralax.   -results yesterday after intervention---reviewed with pt the importance of being proactive with regimen 10. Confusion: New in past 48 hours--reports insomnia.. Schedule trazodone 11. hypokalemia--replacing  LOS (Days) 5 A FACE TO FACE EVALUATION WAS PERFORMED  Lawrence [name],Lawrence [name] 08/26/2012 8:24 AM

## 2012-08-26 NOTE — Progress Notes (Signed)
Occupational Therapy Note  Patient Details  Name: Lawrence [name] MRN: [id number] Date of Birth: Dec 14, 1960 Today's Date: 08/26/2012  Individual session Time:  1600-1450  (50 min) Pain:  3/10  Right leg  Addressed wc mobility, sliding board transfers, UE exercises.  Pt sitting in wc upon OT arrival.  Pt. Propelled wc down to gym about 50 % of way.  Transferred from wc>mat going to right with minimal to supervision assist (on level surface).  Provided verbal cues to keep hands out of way and keep board in correct position.  Sat on mat and used 2# weight on BUE to address triceps, posterior shoulder muscles, scapulas.  Pt. Stated he felt his triceps working.  Tried the lift blocks but too hard for pt.  Transferred met>wc going to left with supervision.  Propelled wc to room (about 50 % of distance).  Transferred wc>bed with minimal assist going up hill to left.  Left in room with nursing.    Humberto Seals 08/26/2012, 5:06 PM

## 2012-08-27 ENCOUNTER — Inpatient Hospital Stay (HOSPITAL_COMMUNITY): Payer: Self-pay | Admitting: Physical Therapy

## 2012-08-27 LAB — GLUCOSE, CAPILLARY
Glucose-Capillary: 185 mg/dL — ABNORMAL HIGH (ref 70–99)
Glucose-Capillary: 223 mg/dL — ABNORMAL HIGH (ref 70–99)

## 2012-08-27 NOTE — Plan of Care (Signed)
Problem: RH BOWEL ELIMINATION Goal: RH STG MANAGE BOWEL WITH ASSISTANCE STG Manage Bowel with moderate Assistance.  Outcome: Progressing BM on 7/18, first since 7/9; after sorbitol. Now agreeable to take miralax daily

## 2012-08-27 NOTE — Progress Notes (Signed)
Physical Therapy Session Note  Patient Details  Name: TAKAI CHIARAMONTE MRN: [id number] Date of Birth: 1960/11/18  Today's Date: 08/27/2012 Time:1300-1355  55 Minutes  Short Term Goals: Week 1:  PT Short Term Goal 1 (Week 1): Patient will perform bed mobility with min assist. PT Short Term Goal 2 (Week 1): Patient will perform lateral/scoot or slide board transfers with min assist. PT Short Term Goal 3 (Week 1): Patient will propel wheelchair 150' with supervision.  Skilled Therapeutic Interventions/Progress Updates:  Pt was seen bedside in the pm. Pt willing to participate with therapy. Pt propelled w/c with B UEs and occasional rest breaks 150 feet x2 with S and verbal cues. Pt transferred w/c to mat, mat to w/c with min Ax1 and sliding board with verbal cues. Edge of mat to supine, supine to edge of mat with min Ax1 and verbal cues. Treatment focused on strengthening of B LEs as well as UEs including w/c push ups, quad sets, glut sets, ham sets and bridging.    Therapy Documentation Precautions:  Precautions Precautions: Fall Precaution Comments: R BKA Required Braces or Orthoses:  ( ) Restrictions Weight Bearing Restrictions: Yes RUE Weight Bearing: Weight bearing as tolerated LUE Weight Bearing: Weight bearing as tolerated RLE Weight Bearing: Non weight bearing LLE Weight Bearing: Weight bearing as tolerated  Pain: No c/o pain.  See FIM for current functional status  Therapy/Group: Individual Therapy  Rayford Halsted 08/27/2012, 3:03 PM

## 2012-08-27 NOTE — Progress Notes (Signed)
Subjective/Complaints: No problems. Slept better. Pain under control A 12 point review of systems has been performed and if not noted above is otherwise negative. .  Objective: Vital Signs: Blood pressure 154/76, pulse 79, temperature 98.5 F (36.9 C), temperature source Oral, resp. rate 16, weight 75 kg (165 lb 5.5 oz), SpO2 98.00%. No results found.  Recent Labs  08/26/12 0500  WBC 5.4  HGB 8.7*  HCT 25.9*  PLT 140*   No results found for this basename: NA, K, CL, CO, GLUCOSE, BUN, CREATININE, CALCIUM,  in the last 72 hours CBG (last 3)   Recent Labs  08/26/12 1652 08/26/12 2031 08/27/12 0751  GLUCAP 189* 173* 121*    Wt Readings from Last 3 Encounters:  08/22/12 75 kg (165 lb 5.5 oz)  08/21/12 74.3 kg (163 lb 12.8 oz)  08/21/12 74.3 kg (163 lb 12.8 oz)    Physical Exam:  Constitutional: He is oriented to person, place, and time. He appears well-nourished. Mild disorientation noted.  HENT:  Head: Normocephalic and atraumatic.  Eyes: Pupils are equal, round, and reactive to light.  Neck: Normal range of motion. Neck supple.  Cardiovascular: Normal rate and regular rhythm.  No murmur heard.  Pulmonary/Chest: Effort normal and breath sounds normal. No respiratory distress. He has no wheezes.  Abdominal: Soft. Bowel sounds are normal. He exhibits no distension. There is no tenderness.  Musculoskeletal: He exhibits edema, 1 to 2+ both legs.   BUE with muscle wasting distally. Left foot with 2+edema to knees. Right bk incision remains clean and intact.  Neurological: He is alert and oriented to person, place, and time. Intact basic insight and awareness.  Skin: Skin is warm and dry.  Psychiatric: His speech is normal. His affect is blunt. He is withdrawn.  motor strength is 4-5/5 in bilateral deltoid, bicep, tricep, grip. 3+ to 4/5 with Left, hip flexors, knee extensors, ankle dorsi flexion plantarflexion  R LE with BKA has 3-/5 HF  Sensation reduced to sharp touch in  both hands and left foot    Assessment/Plan: 1. Functional deficits secondary to right bka which require 3+ hours per day of interdisciplinary therapy in a comprehensive inpatient rehab setting. Physiatrist is providing close team supervision and 24 hour management of active medical problems listed below. Physiatrist and rehab team continue to assess barriers to discharge/monitor patient progress toward functional and medical goals. FIM: FIM - Bathing Bathing Steps Patient Completed: Chest;Right Arm;Left Arm;Abdomen Bathing: 0: Activity did not occur  FIM - Upper Body Dressing/Undressing Upper body dressing/undressing steps patient completed: Put head through opening of pull over shirt/dress;Thread/unthread left sleeve of pullover shirt/dress;Thread/unthread right sleeve of pullover shirt/dresss;Pull shirt over trunk Upper body dressing/undressing: 0: Activity did not occur FIM - Lower Body Dressing/Undressing Lower body dressing/undressing steps patient completed: Thread/unthread right pants leg;Thread/unthread left pants leg;Pull pants up/down;Don/Doff left sock;Don/Doff left shoe;Fasten/unfasten left shoe Lower body dressing/undressing: 0: Activity did not occur  FIM - Toileting Toileting steps completed by patient: Adjust clothing prior to toileting;Performs perineal hygiene;Adjust clothing after toileting Toileting: 0: Activity did not occur  FIM - Diplomatic Services operational officer Devices: Grab bars;Bedside commode Toilet Transfers: 0-Activity did not occur  FIM - Banker Devices: Sliding board;Arm rests Bed/Chair Transfer: 0: Activity did not occur  FIM - Locomotion: Wheelchair Distance: 90 Locomotion: Wheelchair: 2: Travels 50 - 149 ft with supervision, cueing or coaxing FIM - Locomotion: Ambulation Ambulation/Gait Assistance: Not tested (comment) Locomotion: Ambulation: 0: Activity did not  occur  Comprehension Comprehension Mode: Auditory Comprehension: 5-Understands complex 90% of the time/Cues < 10% of the time  Expression Expression Mode: Verbal Expression: 5-Expresses complex 90% of the time/cues < 10% of the time  Social Interaction Social Interaction: 5-Interacts appropriately 90% of the time - Needs monitoring or encouragement for participation or interaction.  Problem Solving Problem Solving: 5-Solves complex 90% of the time/cues < 10% of the time  Memory Memory: 5-Recognizes or recalls 90% of the time/requires cueing < 10% of the time  Medical Problem List and Plan:  1. DVT Prophylaxis/Anticoagulation: Pharmaceutical: Lovenox  2. Pain Management: Continue prn oxycodone. Pain control adequate at present per pt 3. Mood: Noted to have flat affect. Will monitor for now. Provide ego support. LCSW to follow for evaluation.  4. Neuropsych: This patient is capable of making decisions on his own behalf.  5. DM type 2: Will monitor BS with ac/hs cbg checks. Continue Lantus 20 units and use SSI for elevated BS.   -sugars controlled at present 6. Anemia of chronic illness: Recheck 8.7   -recheck Monday  -no active signs of bleeding at present  -heme check stools--negative 7. HTN: Monitor with bid checks. BP better controlled at present 8. Urinary retention: Continue Flomax. UA revealed a few yeast..,ucx negative 9. Constipation: scheduled miralax.   -needs regular regimen 10. Confusion: resolved 11. hypokalemia--replacing  LOS (Days) 6 A FACE TO FACE EVALUATION WAS PERFORMED  SWARTZ,ZACHARY T 08/27/2012 8:48 AM

## 2012-08-27 NOTE — Plan of Care (Signed)
Problem: RH BLADDER ELIMINATION Goal: RH STG MANAGE BLADDER WITH ASSISTANCE STG Manage Bladder With max Assistance  Outcome: Not Progressing Inability to void, no urge; requires I&O cath q6-8hrs

## 2012-08-27 NOTE — Plan of Care (Signed)
Problem: RH PAIN MANAGEMENT Goal: RH STG PAIN MANAGED AT OR BELOW PT'S PAIN GOAL Pain level 3 or less  Outcome: Progressing PRN ultram or percocet effective

## 2012-08-28 ENCOUNTER — Inpatient Hospital Stay (HOSPITAL_COMMUNITY): Payer: Self-pay | Admitting: *Deleted

## 2012-08-28 ENCOUNTER — Inpatient Hospital Stay (HOSPITAL_COMMUNITY): Payer: Self-pay | Admitting: Occupational Therapy

## 2012-08-28 ENCOUNTER — Inpatient Hospital Stay (HOSPITAL_COMMUNITY): Payer: Self-pay

## 2012-08-28 DIAGNOSIS — I739 Peripheral vascular disease, unspecified: Secondary | ICD-10-CM

## 2012-08-28 DIAGNOSIS — I10 Essential (primary) hypertension: Secondary | ICD-10-CM

## 2012-08-28 DIAGNOSIS — D62 Acute posthemorrhagic anemia: Secondary | ICD-10-CM

## 2012-08-28 DIAGNOSIS — IMO0001 Reserved for inherently not codable concepts without codable children: Secondary | ICD-10-CM

## 2012-08-28 DIAGNOSIS — E1165 Type 2 diabetes mellitus with hyperglycemia: Secondary | ICD-10-CM

## 2012-08-28 DIAGNOSIS — L98499 Non-pressure chronic ulcer of skin of other sites with unspecified severity: Secondary | ICD-10-CM

## 2012-08-28 DIAGNOSIS — S88119A Complete traumatic amputation at level between knee and ankle, unspecified lower leg, initial encounter: Secondary | ICD-10-CM

## 2012-08-28 LAB — CBC
MCV: 78.6 fL (ref 78.0–100.0)
Platelets: 144 10*3/uL — ABNORMAL LOW (ref 150–400)
RDW: 15.2 % (ref 11.5–15.5)
WBC: 4.2 10*3/uL (ref 4.0–10.5)

## 2012-08-28 LAB — CREATININE, SERUM
Creatinine, Ser: 1.21 mg/dL (ref 0.50–1.35)
GFR calc Af Amer: 79 mL/min — ABNORMAL LOW (ref >90)
GFR calc non Af Amer: 68 mL/min — ABNORMAL LOW (ref >90)

## 2012-08-28 LAB — GLUCOSE, CAPILLARY
Glucose-Capillary: 115 mg/dL — ABNORMAL HIGH (ref 70–99)
Glucose-Capillary: 182 mg/dL — ABNORMAL HIGH (ref 70–99)

## 2012-08-28 NOTE — Progress Notes (Signed)
Recreational Therapy Session Note  Patient Details  Name: AEON KESSNER MRN: [id number] Date of Birth: 1960/07/06 Today's Date: 08/28/2012 Time:  1430-1505  Pain: no c/o  Skilled Therapeutic Interventions/Progress Updates: Session focused on activity tolerance, dynamic standing balance, & w/c mobility. Pt propelled w/c from room to gym with supervision.  Pt performed slide board transfer with set up assist with supervision.  Pt sat on dynodisc for dynamic balance activity- tossing & catching a ball and then a modified volleyball activity with close supervision.  Therapy/Group: Co-Treatment   Holle Sprick 08/28/2012, 4:05 PM

## 2012-08-28 NOTE — Progress Notes (Signed)
Occupational Therapy Session Note  Patient Details  Name: Lawrence [name] MRN: [id number] Date of Birth: 1960-04-29  Today's Date: 08/28/2012 Time: 0800-0900 Time Calculation (min): 60 min  Short Term Goals: Week 1:  OT Short Term Goal 1 (Week 1): Pt will complete toilet transfer with min assist OT Short Term Goal 2 (Week 1): Pt will complete LB dressing with min assist OT Short Term Goal 3 (Week 1): Pt will complete toilet task with min assist OT Short Term Goal 4 (Week 1): Pt will tolerate standing to complete 1 grooming task  Skilled Therapeutic Interventions/Progress Updates:    Pt declined ADL this am reporting that his sister helped him get washed up yesterday.  Pt transferred to the wheelchair with min assist using sliding board.  Propelled wheelchair down to the gym with supervision and increased time.  In the gym worked on UE strengthening exercises.  Pt performed 3 set of 5 repetitions for push blocks on the edge of the mat.  Therapist stabilized the blocks as pt pushed up.  Only able to complete full repetition on approximately 50% of reps.  Progressed to using 4 lb weights in supine for 2 sets of 20 reps for chest press.  Also performed 2 sets of triceps extension, with therapist stabilizing elbows.  Finished session by having pt perform sliding board transfer back to the wheelchair with min assist and therapist placing board and pt propelling himself back to his room.    Therapy Documentation Precautions:  Precautions Precautions: Fall Precaution Comments: R BKA Required Braces or Orthoses:  ( ) Restrictions Weight Bearing Restrictions: Yes RUE Weight Bearing: Weight bearing as tolerated LUE Weight Bearing: Weight bearing as tolerated RLE Weight Bearing: Non weight bearing LLE Weight Bearing: Weight bearing as tolerated  Pain:  No report of pain during session. ADL: See FIM for current functional status  Therapy/Group: Individual Therapy  Shanica Castellanos  OTR/L 08/28/2012, 10:14 AM

## 2012-08-28 NOTE — Discharge Summary (Signed)
Physician Discharge Summary  Patient ID: Lawrence [name] MRN: [id number] DOB/AGE: 52-Apr-1962 52 y.o.  Admit date: 08/16/2012 Discharge date: 08/21/2012 Admission Diagnoses:  Osteomyelitis of right foot  Discharge Diagnoses:  Principal Problem:   Osteomyelitis of right foot Active Problems:   Urinary retention   Past Medical History  Diagnosis Date  . Hypertension   . Chronic kidney disease   . Hypercholesteremia   . Diabetes mellitus     insulin dependent  . Depression   . 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 male who was admitted 08/16/2012 with a diagnosis of Osteomyelitis of right foot and went to the operating room on 08/16/2012 and underwent the above named procedures.    He was given perioperative antibiotics:      Anti-infectives   Start     Dose/Rate Route Frequency Ordered Stop   08/16/12 1930  ceFAZolin (ANCEF) IVPB 2 g/50 mL premix    Comments:  THIS IS NOT PROPHYLAXIS , HAD OSTEO IN FOOT AND ABSCESS/vac   THIS IS TREATMENT TIMES 48 HRS.   2 g 100 mL/hr over 30 Minutes Intravenous Every 6 hours 08/16/12 1834 08/17/12 0729   08/16/12 1130  ceFAZolin (ANCEF) 2-3 GM-% IVPB SOLR    Comments:  MICHAEL, CYNTHIA: cabinet override      08/16/12 1130 08/16/12 2329   08/16/12 0600  ceFAZolin (ANCEF) 2 g in dextrose 5 % 50 mL IVPB     2 g 140 mL/hr over 30 Minutes Intravenous On call to O.R. 08/15/12 1429 08/16/12 1323    Pt debilitated and slow to advance to independent level.  No assistance at home and no funds available for nursing home placement.  Pt acceptable [facility]pt [facility].   Urinary retention treated with foley cath and flomax.  Pt continued on Doxycycline po. Pain controlled with po analgesics  He was given sequential compression devices, early ambulation, and Lovenox  for DVT prophylaxis.  He benefited maximally from the hospital stay and there were no complications.    Recent vital signs:  Filed Vitals:   08/21/12 1400  BP: 152/51  Pulse: 68  Temp: 98.5 F (36.9 C)  Resp: 16    Recent laboratory studies:  Lab Results  Component Value Date   HGB 8.4* 08/28/2012   HGB 8.7* 08/26/2012   HGB 8.3* 08/24/2012   Lab Results  Component Value Date   WBC 4.2 08/28/2012   PLT 144* 08/28/2012   Lab Results  Component Value Date   INR 0.94 08/15/2012   Lab Results  Component Value Date   NA 139 08/22/2012   K 3.4* 08/22/2012   CL 106 08/22/2012   CO2 23 08/22/2012   BUN 24* 08/22/2012   CREATININE 1.21 08/28/2012   GLUCOSE 96 08/22/2012    Discharge Medications:     Medication List         aspirin 81 MG tablet  Take 81 mg by mouth every morning.     doxycycline 100 MG tablet  Commonly known as:  VIBRA-TABS  Take 50 mg by mouth 2 (two) times daily.     HYDROcodone-acetaminophen 5-325 MG per tablet  Commonly known as:  NORCO/VICODIN  Take 1-2 tablets by mouth every 4 (four) hours as needed. For pain     insulin glargine 100 UNIT/ML injection  Commonly known as:  LANTUS  Inject 20 Units into the skin at bedtime.     metoprolol tartrate 25 MG tablet  Commonly known as:  LOPRESSOR  Take 1 tablet (25 mg total) by mouth 2 (two) times daily.      dressing to remain dry and clean at all times.  Elevation of residual limb when at rest. Non weight bearing on operative limb.  Diagnostic Studies: No results found.  Disposition: Copy Provider Department Dept Phone   08/29/2012 8:00 AM Delon Sacramento, OT [facility] [phone] [facility] A [phone]   08/29/2012 10:00 AM Jackey Loge, PT [facility] [phone] [facility] CENTER A [phone]   08/29/2012 1:00 PM Jackey Loge, PT [facility] CENTER A [phone]   08/29/2012 2:30 PM Daneil Dan, OT [facility] CENTER A [phone]   Joint Appt Clois Dupes, LRT [facility] CENTER A [phone]      Follow-up Information   Follow up with Eldred Manges, MD. Schedule an appointment as soon as possible for a visit in 4 weeks.   Contact information:   [address] [place] [address] [phone]        Signed: Wende Neighbors 08/28/2012, 4:10 PM

## 2012-08-28 NOTE — Progress Notes (Signed)
Patient ID: Lawrence [name], male   DOB: 1960-03-09, 52 y.o.   MRN: [id number] Subjective/Complaints: No problems. Slept better. Pain under control A 12 point review of systems has been performed and if not noted above is otherwise negative. .  Objective: Vital Signs: Blood pressure 166/74, pulse 74, temperature 98.4 F (36.9 C), temperature source Oral, resp. rate 18, weight 75 kg (165 lb 5.5 oz), SpO2 98.00%. No results found.  Recent Labs  08/26/12 0500 08/28/12 0635  WBC 5.4 4.2  HGB 8.7* 8.4*  HCT 25.9* 24.6*  PLT 140* 144*    Recent Labs  08/28/12 0635  CREATININE 1.21   CBG (last 3)   Recent Labs  08/27/12 1613 08/27/12 2053 08/28/12 0733  GLUCAP 185* 223* 115*    Wt Readings from Last 3 Encounters:  08/22/12 75 kg (165 lb 5.5 oz)  08/21/12 74.3 kg (163 lb 12.8 oz)  08/21/12 74.3 kg (163 lb 12.8 oz)    Physical Exam:  Constitutional: He is oriented to person, place, and time. He appears well-nourished. HENT:  Head: Normocephalic and atraumatic.  Eyes: Pupils are equal, round, and reactive to light.  Neck: Normal range of motion. Neck supple.  Cardiovascular: Normal rate and regular rhythm.  No murmur heard.  Pulmonary/Chest: Effort normal and breath sounds normal. No respiratory distress. He has no wheezes.  Abdominal: Soft. Bowel sounds are normal. He exhibits no distension. There is no tenderness.  Musculoskeletal: He exhibits edema, 1 to 2+ both legs.   BUE with muscle wasting distally. Left foot with 2+edema to knees.Neurological: He is alert and oriented to person, place, and time. Intact basic insight and awareness.  Skin: Skin is warm and dry.  Psychiatric: His speech is normal. His affect is blunt. He is withdrawn.  motor strength is 4-5/5 in bilateral deltoid, bicep, tricep, grip. 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 Functional deficits secondary to right bka which require 3+ hours per day of interdisciplinary therapy in a comprehensive inpatient rehab setting. Physiatrist is providing close team supervision and 24 hour management of active medical problems listed below. Physiatrist and rehab team continue to assess barriers to discharge/monitor patient progress toward functional and medical goals. FIM: FIM - Bathing Bathing Steps Patient Completed: Chest;Right Arm;Left Arm;Abdomen Bathing: 0: Activity did not occur  FIM - Upper Body Dressing/Undressing Upper body dressing/undressing steps patient completed: Put head through opening of pull over shirt/dress;Thread/unthread left sleeve of pullover shirt/dress;Thread/unthread right sleeve of pullover shirt/dresss;Pull shirt over trunk Upper body dressing/undressing: 0: Activity did not occur FIM - Lower Body Dressing/Undressing Lower body dressing/undressing steps patient completed: Thread/unthread right pants leg;Thread/unthread left pants leg;Pull pants up/down;Don/Doff left sock;Don/Doff left shoe;Fasten/unfasten left shoe Lower body dressing/undressing: 0: Activity did not occur  FIM - Toileting Toileting steps completed by patient: Adjust clothing prior to toileting;Performs perineal hygiene;Adjust clothing after toileting Toileting: 0: Activity did not occur  FIM - Diplomatic Services operational officer Devices: Grab bars;Bedside commode Toilet Transfers: 0-Activity did not occur  FIM - Banker Devices: Sliding board;Arm rests Bed/Chair Transfer: 0: Activity did not occur  FIM - Locomotion: Wheelchair Distance: 90 Locomotion: Wheelchair: 0: Activity did not occur FIM - Locomotion: Ambulation Ambulation/Gait Assistance: Not tested (comment) Locomotion: Ambulation: 0: Activity did not occur  Comprehension Comprehension Mode: Auditory Comprehension: 5-Understands  basic 90% of the time/requires cueing < 10% of the time  Expression Expression Mode: Verbal Expression: 5-Expresses basic 90% of the time/requires cueing < 10% of the time.  Social Interaction Social Interaction: 5-Interacts appropriately 90% of the time - Needs monitoring or encouragement for participation or interaction.  Problem Solving Problem Solving: 5-Solves basic 90% of the time/requires cueing < 10% of the time  Memory Memory: 5-Recognizes or recalls 90% of the time/requires cueing < 10% of the time  Medical Problem List and Plan:  1. DVT Prophylaxis/Anticoagulation: Pharmaceutical: Lovenox , mild thrombocytopenia , monitor 2. Pain Management: Continue prn oxycodone. Pain control adequate at present per pt 3. Mood: Noted to have flat affect. Will monitor for now. Provide ego support. LCSW to follow for evaluation.  4. Neuropsych: This patient is capable of making decisions on his own behalf.  5. DM type 2: Will monitor BS with ac/hs cbg checks. Continue Lantus 20 units and use SSI for elevated BS.   -sugars controlled at present 6. Anemia of chronic illness: Recheck Hgb stable    -no active signs of bleeding at present  -heme check stools--negative 7. HTN: Monitor with bid checks. BP better controlled at present 8. Urinary retention: Continue Flomax. UA revealed a few yeast..,ucx negative 9. Constipation: scheduled miralax.   -needs regular regimen 10. Confusion: resolved 11. hypokalemia--replacing  LOS (Days) 7 A FACE TO FACE EVALUATION WAS PERFORMED  Claudette Laws E 08/28/2012 9:21 AM

## 2012-08-28 NOTE — Progress Notes (Signed)
Social Work Patient ID: Lawrence [name], male   DOB: 1960/07/09, 52 y.o.   MRN: [id number] Spoke with Teresa-Medicaid worker to inquire about pt's Medicaid status.  She reports his hearing has not been scheduled yet.  But surprised to learn  Pt's leg had been amputated.  Will begin the NH bed search, pt is aware and wants to stay here as long as possible.

## 2012-08-28 NOTE — Progress Notes (Signed)
Occupational Therapy Session Note  Patient Details  Name: Lawrence [name] MRN: [id number] Date of Birth: 1960-11-11  Today's Date: 08/28/2012 Time: 4098-1191 Time Calculation (min): 30 min  Short Term Goals: Week 1:  OT Short Term Goal 1 (Week 1): Pt will complete toilet transfer with min assist OT Short Term Goal 2 (Week 1): Pt will complete LB dressing with min assist OT Short Term Goal 3 (Week 1): Pt will complete toilet task with min assist OT Short Term Goal 4 (Week 1): Pt will tolerate standing to complete 1 grooming task  Skilled Therapeutic Interventions/Progress Updates:    Therapy session focused on postural control and BUE strength. Pt completed sliding board transfer to R side with supervision and assist to position board. Engaged in core strengthening activity of tossing weighted ball. Therapist tossing ball to L and R side of pt to increase challenge of activity. Engaged in bicep, triceps, and shoulder exercises 2 sets 10-15 reps using 2 lb weight. Pt then transferred back to w/c with sliding board going to L side and with steadying assist.   Therapy Documentation Precautions:  Precautions Precautions: Fall Precaution Comments: R BKA Required Braces or Orthoses:  ( ) Restrictions Weight Bearing Restrictions: Yes RUE Weight Bearing: Weight bearing as tolerated LUE Weight Bearing: Weight bearing as tolerated RLE Weight Bearing: Non weight bearing LLE Weight Bearing: Weight bearing as tolerated General:   Vital Signs:   Pain:  No c/o pain during therapy session.   See FIM for current functional status  Therapy/Group: Individual Therapy  Daneil Dan 08/28/2012, 3:12 PM

## 2012-08-28 NOTE — Progress Notes (Signed)
Physical Therapy Session Note  Patient Details  Name: Lawrence [name] MRN: [id number] Date of Birth: 16-Apr-1960  Today's Date: 08/28/2012 Time: 0865-7846 and 9629-5284 Time Calculation (min): 57 min and 45 min  Short Term Goals: Week 1:  PT Short Term Goal 1 (Week 1): Patient will perform bed mobility with min assist. PT Short Term Goal 2 (Week 1): Patient will perform lateral/scoot or slide board transfers with min assist. PT Short Term Goal 3 (Week 1): Patient will propel wheelchair 150' with supervision.  Skilled Therapeutic Interventions/Progress Updates:   AM Session: Patient received sitting in wheelchair. Session focused on wheelchair mobility (in controlled and home environments), slide board transfers (emphasis on set-up prior to transfer), and core strengthening with dynamic sitting balance activities; see details below. Patient propels wheelchair in ADL apartment (on carpet), negotiating obstacles in confined spaces. Patient able to retro propel around obstacles as well. During slide board transfers, patient able to recall all steps to set up for transfer, except he removes both arm rests instead of just arm rest on side he is transferring to. Patient continues to require verbal cues for proper positioning of R limb in wheelchair as he tends to maintain it in hip ER and knee flexion.  Patient returned to room and left seated in wheelchair with all needs within reach.  PM Session: Patient received sitting in wheelchair. Session focused on dynamic sitting balance and static standing. Sitting edge of mat with feet unsupported, sitting on DynoDisc, patient performed x25 ball tosses with SBA. Patient then progresses to tapping ball back and forth with SBA on DynoDisc. Patient performed 2 sit<>stands to Fara Boros with static standing x30" each. Patient returned to room and left seated in wheelchair with all needs within reach.  Therapy Documentation Precautions:  Precautions Precautions:  Fall Precaution Comments: R BKA Restrictions Weight Bearing Restrictions: Yes RLE Weight Bearing: Non weight bearing Pain: Pain Assessment Pain Assessment: No/denies pain Pain Score: 0-No pain Mobility: Bed Mobility Bed Mobility: Supine to Sit;Sit to Supine Supine to Sit: HOB flat;5: Supervision Supine to Sit Details: Verbal cues for technique;Verbal cues for precautions/safety Sit to Supine: 5: Supervision;HOB flat Sit to Supine - Details: Verbal cues for technique;Verbal cues for precautions/safety Transfers Lateral/Scoot Transfers: With armrests removed;With slide board;5: Supervision Lateral/Scoot Transfer Details: Verbal cues for sequencing;Verbal cues for precautions/safety;Verbal cues for technique Lateral/Scoot Transfer Details (indicate cue type and reason): Patient able to perform block practice transfers x4 with slideboard and supervision. Patient able to place slideboard when transferring mat>wheelchair secondary to having more room to perform lateral lean; Requires set-up assist for slideboard when transferring wheelchair>mat secondary to decreased room in wheelchair to perform lateral lean. Locomotion : Ambulation Ambulation: No Gait Gait: No Stairs / Additional Locomotion Stairs: No Corporate treasurer: Yes Wheelchair Assistance: 5: Financial planner Details: Verbal cues for precautions/safety;Verbal cues for Diplomatic Services operational officer: Both upper extremities;Left lower extremity Wheelchair Parts Management: Supervision/cueing Distance: 200  Balance: Dynamic Sitting Balance Dynamic Sitting - Balance Support: Feet unsupported;No upper extremity supported Dynamic Sitting - Level of Assistance: 5: Stand by assistance Dynamic Sitting - Balance Activities: Lateral lean/weight shifting;Forward lean/weight shifting;Reaching for objects;Reaching for weighted objects;Reaching across midline Dynamic Sitting - Comments: Patient  performed 2 round reaching for 8 rings placed outside BOS, requires SBA. Patient performed 1 set x20 reps trunk rotations with 4# ball; emphasis on following ball with eyes to increase trunk rotation and facilitate core strengthening. Patient performed D1 with B UE holding 2# ball in each direction  x10 reps to facilitate core strengthening and sitting balance challenge.  See FIM for current functional status  Therapy/Group: Individual Therapy and Co-Treatment with Rec Therapy for PM session  Chipper Herb. Cassandra Mcmanaman, PT, DPT  08/28/2012, 12:24 PM

## 2012-08-29 ENCOUNTER — Inpatient Hospital Stay (HOSPITAL_COMMUNITY): Payer: Self-pay | Admitting: *Deleted

## 2012-08-29 ENCOUNTER — Inpatient Hospital Stay (HOSPITAL_COMMUNITY): Payer: Self-pay | Admitting: Occupational Therapy

## 2012-08-29 ENCOUNTER — Ambulatory Visit (HOSPITAL_COMMUNITY): Payer: Self-pay

## 2012-08-29 DIAGNOSIS — I739 Peripheral vascular disease, unspecified: Secondary | ICD-10-CM

## 2012-08-29 DIAGNOSIS — E1165 Type 2 diabetes mellitus with hyperglycemia: Secondary | ICD-10-CM

## 2012-08-29 DIAGNOSIS — D62 Acute posthemorrhagic anemia: Secondary | ICD-10-CM

## 2012-08-29 DIAGNOSIS — S88119A Complete traumatic amputation at level between knee and ankle, unspecified lower leg, initial encounter: Secondary | ICD-10-CM

## 2012-08-29 DIAGNOSIS — L98499 Non-pressure chronic ulcer of skin of other sites with unspecified severity: Secondary | ICD-10-CM

## 2012-08-29 DIAGNOSIS — I10 Essential (primary) hypertension: Secondary | ICD-10-CM

## 2012-08-29 LAB — GLUCOSE, CAPILLARY: Glucose-Capillary: 93 mg/dL (ref 70–99)

## 2012-08-29 MED ORDER — GABAPENTIN 100 MG PO CAPS
100.0000 mg | ORAL_CAPSULE | Freq: Every day | ORAL | Status: DC
  Administered 2012-08-29 – 2012-08-31 (×3): 100 mg via ORAL
  Filled 2012-08-29 (×4): qty 1

## 2012-08-29 NOTE — Progress Notes (Signed)
Patient's last BM 7/19, patient c/o nausea and vomit once at 1230.  Offer patient laxative, patient refuse states his vomiting has nothing to do with not having BM.  Delle Reining, PA notified, no new order at this time.  Will continue to monitor.

## 2012-08-29 NOTE — Progress Notes (Signed)
Social Work Patient ID: Lawrence [name], male   DOB: [birth year], 52 y.o.   MRN: [id number] Met with pt to inform two facilities are interested in having come stay with them until able to return home.  He asked if they were nursing homes.  Have discussed with pt that Is the next venue unless he has another plan.  Have left a message for his sister to discuss the discharge options.  Pt is reaching his wheelchair supervision Level goals here, according to therapists.  If pt can come up with another plan can work toward this.  Await sister's return call.

## 2012-08-29 NOTE — Progress Notes (Signed)
Physical Therapy Session Note  Patient Details  Name: Lawrence [name] MRN: [id number] Date of Birth: 04-05-1960  Today's Date: 08/29/2012 Time: 1000-1042 Time Calculation (min): 42 min; Patient missed last 18 minutes of AM session and missed full 45 minutes of PM session  Short Term Goals: Week 1:  PT Short Term Goal 1 (Week 1): Patient will perform bed mobility with min assist. PT Short Term Goal 2 (Week 1): Patient will perform lateral/scoot or slide board transfers with min assist. PT Short Term Goal 3 (Week 1): Patient will propel wheelchair 150' with supervision.  Skilled Therapeutic Interventions/Progress Updates:    AM Session: Patient received sitting in wheelchair. Session focused on wheelchair mobility, functional transfers, and strengthening. In sitting, patient performed 2 sets x10 reps knee extension with 3" hold. Patient performed UE seated push ups with push up blocks 2 sets x10 reps. With use of L LE, patient able to almost achieve complete standing. Patient initiated L LE bridging in supine with R limb supported on pillow. After 2 reps, patient reports feeling very nauseous and needs to sit up. Patient returned to sitting and trash can provided. Patient presents with vomiting. Patient performed slide board transfer mat>wheelchair with supervision and returned to room, left seated in wheelchair with all needs within reach. RN notified.  PM Session: Patient received sitting in wheelchair. Patient continues to present with nausea and vomiting. Patient provided with saltine crackers and diet ginger ale. RN aware of status. Patient left seated in wheelchair with all needs within reach.  Therapy Documentation Precautions:  Precautions Precautions: Fall Precaution Comments: R BKA Restrictions Weight Bearing Restrictions: Yes RLE Weight Bearing: Non weight bearing General: Amount of Missed PT Time (min): 18 Minutes and 45 minutes Missed Time Reason: Patient ill (comment)  (nausea/vomitting) Pain: Pain Assessment Pain Assessment: No/denies pain Pain Score: 0-No pain Locomotion : Ambulation Ambulation: No Ambulation/Gait Assistance: Not tested (comment) Gait Gait: No Stairs / Additional Locomotion Stairs: No Corporate treasurer: Yes Wheelchair Assistance: 5: Financial planner Details: Verbal cues for Engineer, drilling: Both upper extremities Wheelchair Parts Management: Supervision/cueing Distance: 160   See FIM for current functional status  Therapy/Group: Individual Therapy  Chipper Herb. Danelle Curiale, PT, DPT 08/29/2012, 10:53 AM

## 2012-08-29 NOTE — Progress Notes (Signed)
Patient's blood sugar at 0712 was 68, patient denies any symptoms.  Blood sugar recheck at 0908 was 93.  Patient vomited twice after breakfast, patient states the food he had for breakfast made him vomit.  Zofran given will continue to monitor patient.

## 2012-08-29 NOTE — Progress Notes (Signed)
Occupational Therapy Note  Patient Details  Name: Lawrence [name] MRN: [id number] Date of Birth: Apr 13, 1960 Today's Date: 08/29/2012  Patient missing 30 min of skilled OT services this PM secondary to nausea and vomiting. Patient sitting in w/c upon arrival with basin that appeared to have vomit in it. Patient reported he had been vomiting all day and continues to feel nauseous and unable to participate in therapy. Discussed possible causes of nausea (food, medicine, etc) and patient reported he thought it was from eating cream cheese with breakfast. Provided encouragement to attempt therapy and educated on benefits however patient requested to stay in room. Offered patient cool wash clothes and drink however he declined on several occasions. Patient left sitting in w/c with all needs in reach. Nursing aware of patient's nausea/vomiting.   Neveyah Garzon, Vara Guardian 08/29/2012, 2:43 PM

## 2012-08-29 NOTE — Progress Notes (Signed)
Recreational Therapy Session Note  Patient Details  Name: Lawrence [name] MRN: [id number] Date of Birth: [birth year] Today's Date: 08/29/2012 Time:  1430 Pain: no c/o Skilled Therapeutic Interventions/Progress Updates: Pt missed 30 minutes of scheduled co-treat due to c/o nausea/vomiting.  Pt reported he had been vomiting all day and was/is unable to participate in therapy.  RN aware. Emidio Warrell 08/29/2012, 4:25 PM

## 2012-08-29 NOTE — Progress Notes (Signed)
Patient ID: Lawrence [name], male   DOB: Jul 20, 1960, 52 y.o.   MRN: [id number] Subjective/Complaints:  Slept poorly. Complaints of phantom limb pain  A 12 point review of systems has been performed and if not noted above is otherwise negative. .  Objective: Vital Signs: Blood pressure 159/66, pulse 68, temperature 97.6 F (36.4 C), temperature source Oral, resp. rate 18, weight 75 kg (165 lb 5.5 oz), SpO2 100.00%. No results found.  Recent Labs  08/28/12 0635  WBC 4.2  HGB 8.4*  HCT 24.6*  PLT 144*    Recent Labs  08/28/12 0635  CREATININE 1.21   CBG (last 3)   Recent Labs  08/29/12 0712 08/29/12 0908 08/29/12 1113  GLUCAP 68* 93 109*    Wt Readings from Last 3 Encounters:  08/22/12 75 kg (165 lb 5.5 oz)  08/21/12 74.3 kg (163 lb 12.8 oz)  08/21/12 74.3 kg (163 lb 12.8 oz)    Physical Exam:  Constitutional: He is oriented to person, place, and time. He appears well-nourished. HENT:  Head: Normocephalic and atraumatic.  Eyes: Pupils are equal, round, and reactive to light.  Neck: Normal range of motion. Neck supple.  Cardiovascular: Normal rate and regular rhythm.  No murmur heard.  Pulmonary/Chest: Effort normal and breath sounds normal. No respiratory distress. He has no wheezes.  Abdominal: Soft. Bowel sounds are normal. He exhibits no distension. There is no tenderness.  Musculoskeletal: He exhibits edema, 1 to 2+ both legs.   BUE with muscle wasting distally. Left foot with 2+edema to knees.Neurological: He is alert and oriented to person, place, and time. Intact basic insight and awareness.  Skin: Skin is warm and dry.  Psychiatric: His speech is normal. His affect is blunt. He is withdrawn.  motor strength is 4-5/5 in bilateral deltoid, bicep, tricep, grip. 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  Functional deficits secondary to right bka which require 3+ hours per day of interdisciplinary therapy in a comprehensive inpatient rehab setting. Physiatrist is providing close team supervision and 24 hour management of active medical problems listed below. Physiatrist and rehab team continue to assess barriers to discharge/monitor patient progress toward functional and medical goals. FIM: FIM - Bathing Bathing Steps Patient Completed: Chest;Right Arm;Left Arm;Abdomen;Right upper leg;Left upper leg;Left lower leg (including foot) Bathing: 4: Min-Patient completes 8-9 90f 10 parts or 75+ percent  FIM - Upper Body Dressing/Undressing Upper body dressing/undressing steps patient completed: Thread/unthread right sleeve of pullover shirt/dresss;Thread/unthread left sleeve of pullover shirt/dress;Put head through opening of pull over shirt/dress;Pull shirt over trunk Upper body dressing/undressing: 5: Set-up assist to: Obtain clothing/put away FIM - Lower Body Dressing/Undressing Lower body dressing/undressing steps patient completed: Thread/unthread right pants leg;Thread/unthread left pants leg;Don/Doff left sock;Don/Doff left shoe Lower body dressing/undressing: 4: Min-Patient completed 75 plus % of tasks  FIM - Toileting Toileting steps completed by patient: Adjust clothing prior to toileting;Performs perineal hygiene;Adjust clothing after toileting Toileting: 0: Activity did not occur  FIM - Diplomatic Services operational officer Devices: Grab bars;Bedside commode Toilet Transfers: 0-Activity did not occur  FIM - Banker Devices: Sliding board;Arm rests Bed/Chair Transfer: 5: Supine > Sit: Supervision (verbal cues/safety issues);5: Sit > Supine: Supervision (verbal cues/safety issues);5: Chair or W/C > Bed: Supervision (verbal cues/safety issues);5: Bed > Chair or W/C: Supervision (verbal cues/safety issues)  FIM - Locomotion:  Wheelchair Distance: 160 Locomotion: Wheelchair: 5: Travels 150 ft or more: maneuvers on rugs and over door sills with supervision, cueing or coaxing FIM - Locomotion: Ambulation Ambulation/Gait Assistance: Not tested (comment) Locomotion: Ambulation: 0: Activity did not occur  Comprehension Comprehension Mode: Auditory Comprehension: 5-Understands basic 90% of the time/requires cueing < 10% of the time  Expression Expression Mode: Verbal Expression: 5-Expresses basic 90% of the time/requires cueing < 10% of the time.  Social Interaction Social Interaction: 5-Interacts appropriately 90% of the time - Needs monitoring or encouragement for participation or interaction.  Problem Solving Problem Solving: 5-Solves basic 90% of the time/requires cueing < 10% of the time  Memory Memory: 5-Recognizes or recalls 90% of the time/requires cueing < 10% of the time  Medical Problem List and Plan:  1. DVT Prophylaxis/Anticoagulation: Pharmaceutical: Lovenox , mild thrombocytopenia , monitor 2. Pain Management: Continue prn oxycodone. Add gabapentin for phantom pain  3. Mood: Noted to have flat affect. Will monitor for now. Provide ego support. LCSW to follow for evaluation.  4. Neuropsych: This patient is capable of making decisions on his own behalf.  5. DM type 2: Will monitor BS with ac/hs cbg checks. Continue Lantus 20 units and use SSI for elevated BS.   -sugars controlled at present 6. Anemia of chronic illness: Recheck Hgb stable    -no active signs of bleeding at present  -heme check stools--negative 7. HTN: Monitor with bid checks. BP better controlled at present 8. Urinary retention: Continue Flomax. UA revealed a few yeast..,ucx negative 9. Constipation: scheduled miralax.   -needs regular regimen 10. Confusion: resolved 11. hypokalemia--replacing  LOS (Days) 8 A FACE TO FACE EVALUATION WAS PERFORMED  Erick Colace 08/29/2012 12:55 PM

## 2012-08-29 NOTE — Progress Notes (Signed)
Occupational Therapy Weekly Progress Note  Patient Details  Name: Lawrence [name] MRN: [id number] Date of Birth: 12-20-1960  Today's Date: 08/29/2012 Time: 0800-0856 Time Calculation (min): 56 min  Patient has met 1 of 4 short term goals.  He continues to need max assist to total assist for sit to stands as well as mod instructional cueing for foot placement and to push up from the wheelchair.  He is able to perform sliding board transfers to his wheelchair with min assist but needs max assist for squat pivot to the drop arm commode and total assist for sit to stand during clothing and hygiene.  We are continuing to focus on UE strengthening tasks to help with increasing sit to stand efficiency.  Once in standing he needs mod assist to maintain balance while managing peri care or attempting to pull pants up or down.  Plan for SNF continued rehab once bed is available.    Patient continues to demonstrate the following deficits: decreased balance, decreased strength, decreased muscular endurance, decreased problem solving.   and therefore will continue to benefit from skilled OT intervention to enhance overall performance with BADL.  Patient progressing toward long term goals..  Continue plan of care.  OT Short Term Goals Week 2:  OT Short Term Goal 1 (Week 2): Pt will perform toilet transfer with min assist to drop arm commode. OT Short Term Goal 2 (Week 2): Pt will perform bathing in sitting with lateral leans with supervision. OT Short Term Goal 3 (Week 2): Pt will perform dressing in sitting with lateral leans and supervision. OT Short Term Goal 5 (Week 2): Pt will peform sit to stand with mod assist consistently in preparation for sit to stand LB selfcare tasks.  Skilled Therapeutic Interventions/Progress Updates:    During session pt transferred to wheelchair with min assist and mod instructional cueing using the sliding board.  Pt performed bathing and dressing sit to stand at the sink.  Pt  needed total assist for initial sit to stand for peri washing and mod assist for sit to stand when attempting to pull pants over hips.  Pt still with decreased ability to use the LLE efficiently and shift weight forward over it.    Therapy Documentation Precautions:  Precautions Precautions: Fall Precaution Comments: R BKA Required Braces or Orthoses:  ( ) Restrictions Weight Bearing Restrictions: No RUE Weight Bearing: Weight bearing as tolerated LUE Weight Bearing: Weight bearing as tolerated RLE Weight Bearing: Non weight bearing LLE Weight Bearing: Weight bearing as tolerated  Pain: Pain Assessment Pain Assessment: No/denies pain Pain Score: 0-No pain ADL: See FIM for current functional status  Therapy/Group: Individual Therapy  Tennyson Wacha OTR/L 08/29/2012, 11:00 AM

## 2012-08-30 ENCOUNTER — Inpatient Hospital Stay (HOSPITAL_COMMUNITY): Payer: Self-pay | Admitting: *Deleted

## 2012-08-30 ENCOUNTER — Inpatient Hospital Stay (HOSPITAL_COMMUNITY): Payer: Self-pay | Admitting: Occupational Therapy

## 2012-08-30 ENCOUNTER — Inpatient Hospital Stay (HOSPITAL_COMMUNITY): Payer: Self-pay

## 2012-08-30 LAB — GLUCOSE, CAPILLARY
Glucose-Capillary: 150 mg/dL — ABNORMAL HIGH (ref 70–99)
Glucose-Capillary: 145 mg/dL — ABNORMAL HIGH (ref 70–99)

## 2012-08-30 MED ORDER — TAMSULOSIN HCL 0.4 MG PO CAPS
0.8000 mg | ORAL_CAPSULE | Freq: Every day | ORAL | Status: DC
  Administered 2012-08-31 – 2012-09-01 (×2): 0.8 mg via ORAL
  Filled 2012-08-30 (×3): qty 2

## 2012-08-30 MED ORDER — BETHANECHOL CHLORIDE 25 MG PO TABS
25.0000 mg | ORAL_TABLET | Freq: Four times a day (QID) | ORAL | Status: DC
  Administered 2012-08-30 – 2012-09-01 (×8): 25 mg via ORAL
  Filled 2012-08-30 (×12): qty 1

## 2012-08-30 NOTE — Progress Notes (Signed)
Social Work Patient ID: Lawrence [name], male   DOB: 1960-12-15, 52 y.o.   MRN: [id number] Met with pt and spoke with his sister via telephone to inform team conference progression toward goals and medical issues-MD to begin another Bladder medicine to see if helps pt void.  Informed both MD feels pt will be ready Friday to transfer to [facility].  Sister to look at both facilities and choose the  On they want to go too.  Again discussed he could go to her home if an option.  She reports it is not an option.

## 2012-08-30 NOTE — Progress Notes (Signed)
Patient ID: Lawrence [name], male   DOB: [birth year], 52 y.o.   MRN: [id number] Subjective/Complaints:  Slept poorly. Complaints of phantom limb pain improved.  Wants to stay in hospital  A 12 point review of systems has been performed and if not noted above is otherwise negative. .  Objective: Vital Signs: Blood pressure 121/69, pulse 66, temperature 97.9 F (36.6 C), temperature source Oral, resp. rate 18, weight 75 kg (165 lb 5.5 oz), SpO2 100.00%. No results found.  Recent Labs  08/28/12 0635  WBC 4.2  HGB 8.4*  HCT 24.6*  PLT 144*    Recent Labs  08/28/12 0635  CREATININE 1.21   CBG (last 3)   Recent Labs  08/29/12 1616 08/29/12 2053 08/30/12 0720  GLUCAP 132* 142* 150*    Wt Readings from Last 3 Encounters:  08/22/12 75 kg (165 lb 5.5 oz)  08/21/12 74.3 kg (163 lb 12.8 oz)  08/21/12 74.3 kg (163 lb 12.8 oz)    Physical Exam:  Constitutional: He is oriented to person, place, and time. He appears well-nourished. HENT:  Head: Normocephalic and atraumatic.  Eyes: Pupils are equal, round, and reactive to light.  Neck: Normal range of motion. Neck supple.  Cardiovascular: Normal rate and regular rhythm.  No murmur heard.  Pulmonary/Chest: Effort normal and breath sounds normal. No respiratory distress. He has no wheezes.  Abdominal: Soft. Bowel sounds are normal. He exhibits no distension. There is no tenderness.  Musculoskeletal: He exhibits edema, 1 to 2+ both legs.   BUE with muscle wasting distally. Left foot with 2+edema to knees.Neurological: He is alert and oriented to person, place, and time. Intact basic insight and awareness.  Skin: Skin is warm and dry.  Psychiatric: His speech is normal. His affect is blunt. He is withdrawn.  motor strength is 4-5/5 in bilateral deltoid, bicep, tricep, grip. 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 Functional deficits secondary to right bka which require 3+ hours per day of interdisciplinary therapy in a comprehensive inpatient rehab setting. Physiatrist is providing close team supervision and 24 hour management of active medical problems listed below. Physiatrist and rehab team continue to assess barriers to discharge/monitor patient progress toward functional and medical goals. Team conference today please see physician documentation under team conference tab, met with team face-to-face to discuss problems,progress, and goals. Formulized individual treatment plan based on medical history, underlying problem and comorbidities. FIM: FIM - Bathing Bathing Steps Patient Completed: Chest;Right Arm;Left Arm;Abdomen;Right upper leg;Left upper leg;Left lower leg (including foot) Bathing: 4: Min-Patient completes 8-9 56f 10 parts or 75+ percent  FIM - Upper Body Dressing/Undressing Upper body dressing/undressing steps patient completed: Thread/unthread right sleeve of pullover shirt/dresss;Thread/unthread left sleeve of pullover shirt/dress;Put head through opening of pull over shirt/dress;Pull shirt over trunk Upper body dressing/undressing: 5: Set-up assist to: Obtain clothing/put away FIM - Lower Body Dressing/Undressing Lower body dressing/undressing steps patient completed: Thread/unthread right pants leg;Thread/unthread left pants leg;Don/Doff left sock;Don/Doff left shoe Lower body dressing/undressing: 4: Min-Patient completed 75 plus % of tasks  FIM - Toileting Toileting steps completed by patient: Adjust clothing prior to toileting;Performs perineal hygiene;Adjust clothing after toileting Toileting: 0: Activity did not occur  FIM - Diplomatic Services operational officer Devices: Grab bars;Bedside commode Toilet Transfers: 0-Activity did not occur  FIM - Banker  Devices: Sliding board;Arm rests;Bed  rails Bed/Chair Transfer: 5: Supine > Sit: Supervision (verbal cues/safety issues);4: Bed > Chair or W/C: Min A (steadying Pt. > 75%)  FIM - Locomotion: Wheelchair Distance: 160 Locomotion: Wheelchair: 0: Activity did not occur FIM - Locomotion: Ambulation Ambulation/Gait Assistance: Not tested (comment) Locomotion: Ambulation: 0: Activity did not occur  Comprehension Comprehension Mode: Auditory Comprehension: 5-Understands basic 90% of the time/requires cueing < 10% of the time  Expression Expression Mode: Verbal Expression: 5-Expresses basic 90% of the time/requires cueing < 10% of the time.  Social Interaction Social Interaction: 5-Interacts appropriately 90% of the time - Needs monitoring or encouragement for participation or interaction.  Problem Solving Problem Solving: 5-Solves complex 90% of the time/cues < 10% of the time  Memory Memory: 5-Recognizes or recalls 90% of the time/requires cueing < 10% of the time  Medical Problem List and Plan:  1. DVT Prophylaxis/Anticoagulation: Pharmaceutical: Lovenox , mild thrombocytopenia , monitor 2. Pain Management: Continue prn oxycodone. Add gabapentin for phantom pain  3. Mood: Noted to have flat affect. Will monitor for now. Provide ego support. LCSW to follow for evaluation.  4. Neuropsych: This patient is capable of making decisions on his own behalf.  5. DM type 2: Will monitor BS with ac/hs cbg checks. Continue Lantus 20 units and use SSI for elevated BS.   -sugars controlled at present 6. Anemia of chronic illness: Recheck Hgb stable    -no active signs of bleeding at present  -heme check stools--negative 7. HTN: Monitor with bid checks. BP better controlled at present 8. Urinary retention: Continue Flomax, increase dose , add urecholine. UA revealed a few yeast..,ucx negative 9. Constipation: scheduled miralax.   -needs regular regimen 10. Confusion: resolved 11. hypokalemia--replacing  LOS (Days) 9 A FACE TO  FACE EVALUATION WAS PERFORMED  Claudette Laws E 08/30/2012 10:21 AM

## 2012-08-30 NOTE — Progress Notes (Signed)
Recreational Therapy Session Note  Patient Details  Name: Lawrence [name] MRN: [id number] Date of Birth: 06-Jan-1961 Today's Date: 08/30/2012 Time:  1300-138 Pain: no c/o Skilled Therapeutic Interventions/Progress Updates: Session focus on activity tolerance, w/c mobility, UE strengthening, furniture transfer using slide board.  Pt propelled w/c on tile & carpeted with multiple rest breaks from room to 3rd floor solarium with supervision, min cues for accessing elevator.  Pt practiced slide board transfer to living room chair with armrests with supervision/set up assist and verbal cues.  Therapy/Group: Co-Treatment Jerrika Ledlow 08/30/2012, 4:11 PM

## 2012-08-30 NOTE — Progress Notes (Signed)
Occupational Therapy Session Note  Patient Details  Name: Lawrence [name] MRN: [id number] Date of Birth: [birth year]  Today's Date: 08/30/2012 Time: 4098-1191 Time Calculation (min): 40 min  Short Term Goals: Week 2:  OT Short Term Goal 1 (Week 2): Pt will perform toilet transfer with min assist to drop arm commode. OT Short Term Goal 2 (Week 2): Pt will perform bathing in sitting with lateral leans with supervision. OT Short Term Goal 3 (Week 2): Pt will perform dressing in sitting with lateral leans and supervision. OT Short Term Goal 5 (Week 2): Pt will peform sit to stand with mod assist consistently in preparation for sit to stand LB selfcare tasks.  Skilled Therapeutic Interventions/Progress Updates:  Patient resting in w/c upon arrival.  Patient provided with choice to practice drop arm commode transfer with or without the sliding board.  Following discussion of the pros and cons of using the board vs not using the board were reviewed, patient decided to practice without using the board.  Patient was min assist traveling to his left and mod assist traveling to his right.  Techniques for safe transfers were reviewed and discussion of method he will use for pants down and up.  Patient return demonstrated lateral leans right and left while seated on drop arm commode and stabilizing on chair or w/c placed on either side of commode.  Therapy Documentation Precautions:  Precautions Precautions: Fall Precaution Comments: R BKA Required Braces or Orthoses:  ( ) Restrictions Weight Bearing Restrictions: Yes RUE Weight Bearing: Weight bearing as tolerated LUE Weight Bearing: Weight bearing as tolerated RLE Weight Bearing: Non weight bearing LLE Weight Bearing: Weight bearing as tolerated Pain: Pain Assessment Pain Assessment: No/denies pain ADL: See FIM for current functional status  Therapy/Group: Individual Therapy  [name] 08/30/2012, 1:58 PM

## 2012-08-30 NOTE — Progress Notes (Addendum)
Physical Therapy Weekly Progress Note  Patient Details  Name: Lawrence [name] MRN: [id number] Date of Birth: 1960/10/12  Today's Date: 08/30/2012 Time: 1000-1027, 4098-1191, 1515-1600 Time Calculation (min): 27 min, 43 min, 45 min  Patient has met 3 of 3 short term goals. Patient has made good progress in his first week of rehab, meeting all 3 STGs and has also met 7 out of 8 LTGs.  Patient continues to demonstrate the following deficits: decreased activity tolerance, decreased strength, decreased standing balance, decreased coordination, and therefore will continue to benefit from skilled PT intervention to enhance overall performance with activity tolerance, balance, ability to compensate for deficits and coordination.  Patient progressing toward long term goals..  Continue plan of care.  PT Short Term Goals Week 1:  PT Short Term Goal 1 (Week 1): Patient will perform bed mobility with min assist. PT Short Term Goal 1 - Progress (Week 1): Met PT Short Term Goal 2 (Week 1): Patient will perform lateral/scoot or slide board transfers with min assist. PT Short Term Goal 2 - Progress (Week 1): Met PT Short Term Goal 3 (Week 1): Patient will propel wheelchair 150' with supervision. PT Short Term Goal 3 - Progress (Week 1): Met Week 2:  PT Short Term Goal 1 (Week 2): STGs=LTGs  Skilled Therapeutic Interventions/Progress Updates:    AM Session: Patient received from OT seated in wheelchair. Session focused on ramp negotiation and car transfer (see details below for ramp negotiation). Patient performed car transfer to sedan height with sliding board and supervision/set up. Patient requires verbal cues to locate levers to remove arm rest and leg rest on wheelchair. Additionally, patient requires assist to place slide board, but is able to complete transfer with supervision. Patient returned to room and left seated in wheelchair with all needs within reach.  PM Session #1: Patient received sitting  in wheelchair. Session focused on wheelchair mobility on tile and carpet surfaces and slide board transfers wheelchair<>furniture. Patient completed x2 slide board transfers in solarium to sofa with supervision/set up assist. Patient returned to room and left seated in wheelchair with all needs within reach.  PM Session #2: Patient received sitting in wheelchair. Session focused on wheelchair mobility/propulsion in community environment (off unit, on/off elevator, in hospital lobby, and outside on uneven/slanted surfaces); Patient requires supervision for all community wheelchair mobility. Patient returned to room and left seated in wheelchair with all needs within reach.  Therapy Documentation Precautions:  Precautions Precautions: Fall Precaution Comments: R BKA Restrictions Weight Bearing Restrictions: Yes RLE Weight Bearing: Non weight bearing Pain: Pain Assessment Pain Assessment: No/denies pain Pain Score: 0-No pain Locomotion : Ambulation Ambulation: No Ambulation/Gait Assistance: Not tested (comment) Gait Gait: No Stairs / Additional Locomotion Stairs: No Ramp: 5: Supervision (x2 in wheelchair; ascends backwards, descends forwards) Naval architect Mobility: Yes Wheelchair Assistance: 5: Financial planner Details: Verbal cues for Engineer, drilling: Both upper extremities Wheelchair Parts Management: Supervision/cueing Distance: 160 x4  See FIM for current functional status  Therapy/Group: Individual Therapy  Chipper Herb. Daisha Filosa, PT, DPT 08/30/2012, 10:49 AM

## 2012-08-30 NOTE — Progress Notes (Signed)
Occupational Therapy Session Note  Patient Details  Name: Lawrence [name] MRN: [id number] Date of Birth: 1961-01-13  Today's Date: 08/30/2012 Time: 0900-0955 Time Calculation (min): 55 min  Short Term Goals: Week 2:  OT Short Term Goal 1 (Week 2): Pt will perform toilet transfer with min assist to drop arm commode. OT Short Term Goal 2 (Week 2): Pt will perform bathing in sitting with lateral leans with supervision. OT Short Term Goal 3 (Week 2): Pt will perform dressing in sitting with lateral leans and supervision. OT Short Term Goal 5 (Week 2): Pt will peform sit to stand with mod assist consistently in preparation for sit to stand LB selfcare tasks.  Skilled Therapeutic Interventions/Progress Updates:    Pt resting in bed upon arrival.  Pt stated his sister helped him bathe last night since he had been sick most of yesterday.  Pt transferred to w/c after placing transfer board.  Pt required min A for transfer.  Pt rolled to gym for BUE on UE ergometer (4 min at level 5 and 4 min at level 5 in reverse) and PNF with 2kg weighted ball. Pt also performed wheel chair push-ups (3 sets of 8). Pt demonstrated adequate clearance from seat but fatigued quickly and required rest breaks of 2 mins between sets.  Therapy Documentation Precautions:  Precautions Precautions: Fall Precaution Comments: R BKA Required Braces or Orthoses:  ( ) Restrictions Weight Bearing Restrictions: Yes RUE Weight Bearing: Weight bearing as tolerated LUE Weight Bearing: Weight bearing as tolerated RLE Weight Bearing: Non weight bearing LLE Weight Bearing: Weight bearing as tolerated   Pain: Pain Assessment Pain Assessment: No/denies pain Pain Score: 0-No pain  See FIM for current functional status  Therapy/Group: Individual Therapy  Rich Brave 08/30/2012, 10:01 AM

## 2012-08-30 NOTE — Patient Care Conference (Signed)
Inpatient RehabilitationTeam Conference and Plan of Care Update Date: 08/30/2012   Time: 11:20 Am    Patient Name: Lawrence [name]      Medical Record Number: [id number]  Date of Birth: 12-21-1960 Sex: Male         Room/Bed: 4W22C/4W22C-01 Payor Info: Payor: /    Admitting Diagnosis: R BKA  Admit Date/Time:  08/21/2012  6:33 PM Admission Comments: No comment available   Primary Diagnosis:  Unilateral complete BKA Principal Problem: Unilateral complete BKA  Patient Active Problem List   Diagnosis Date Noted  . Unilateral complete BKA--right 08/22/2012  . Urinary retention 08/19/2012  . Osteomyelitis of right foot 08/17/2012    Class: Diagnosis of  . ARF (acute renal failure) 06/03/2012  . Cellulitis 06/03/2012  . Post-operative wound abscess 05/29/2012  . Gangrene of toe 05/02/2012  . Chronic cough 02/22/2012  . Hematuria 03/23/2011  . Prostate asymmetry 03/23/2011  . Colon cancer screening 02/19/2011  . Neuropathy 08/25/2010  . Chronic kidney disease (CKD), stage 4 03/14/2009  . Accelerated hypertension 06/18/2008  . DM (diabetes mellitus), type 2, uncontrolled 12/26/2007  . HYPERCHOLESTEROLEMIA 12/26/2007    Expected Discharge Date: Expected Discharge Date: 09/01/12  Team Members Present: Physician leading conference: Dr. Claudette Laws Social Worker Present: Dossie Der, LCSW Nurse Present: Laural Roes, RN PT Present: Edman Circle, PT;Becky Henrene Dodge, PT;Bridgett Ripa, PT;Caroline Sonia Side, PT OT Present: Rosalio Loud, OT;Kris Gellert, Carollee Sires, OT SLP Present: Fae Pippin, SLP     Current Status/Progress Goal Weekly Team Focus  Medical   Constipation but refuses bowel program, urinary retention  Improve bladder emptying  Trial of medication management.   Bowel/Bladder   Requires in and out cathing q 6-8 hours; LBM 7/19 patient has continue to refuse laxatives however will take scheduled stool softners  Patient to void; take laxative to  prevent constipation  Increase fluid intake; laxative to for BM.   Swallow/Nutrition/ Hydration     na        ADL's     supervision level   goals overall supervision transfers and mod stand   reaching supervision level goals and will require min for LE   Mobility   Pt is currently supervision for UB selfcare and LB selfcare if done in sitting with lateral leans.  Needs max to total assist if incorporating sit to stand for LB selfcare.  Min assist to supervision for sliding board transfers but needs max assist for squat pivot as such with the drop arm commode.  goals are overall supervision level  sequencing, problem solving for transfers, selfcare re-training, UE strengthening and enduranc building.   Communication     na        Safety/Cognition/ Behavioral Observations    na        Pain   Oxycodone 5mg -325mg  1-2 tablet q4h prn for moderate to severe pain, and Tramadol 50mg  prn for mild pain  Pain level less than a 3  Monitor and assess pain q shift and prn   Skin   Right BKA with sutures  Monitor and assess incision site q shift and prn, and daily dressing changes  Continue to assess site, and educate patient on dressing changes      *See Care Plan and progress notes for long and short-term goals.  Barriers to Discharge: Home accessibility, inability to void.    Possible Resolutions to Barriers:  Possible SNF placement, medication management, may need a Foley catheter placement    Discharge Planning/Teaching Needs:  Two facilities interested in taking pt-sister has concerns regarding voiding issues and bowel issues.  Wants addressed before he leaves      Team Discussion:  Bladder meds started to see if can void-wait a day before ready for NH.  Pt reaching supervision level w/c goals-wants to stay here longer.  Continues to refuse bowel meds.  Pain managed and wound looks good  Revisions to Treatment Plan:  NH bed found for pt ready Friday   Continued Need for Acute  Rehabilitation Level of Care: The patient requires daily medical management by a physician with specialized training in physical medicine and rehabilitation for the following conditions: Daily direction of a multidisciplinary physical rehabilitation program to ensure safe treatment while eliciting the highest outcome that is of practical value to the patient.: Yes Daily medical management of patient stability for increased activity during participation in an intensive rehabilitation regime.: Yes Daily analysis of laboratory values and/or radiology reports with any subsequent need for medication adjustment of medical intervention for : Post surgical problems;Other  Lucy Chris 08/31/2012, 1:43 PM

## 2012-08-30 NOTE — Progress Notes (Signed)
Discussed bowel program again. Patient has been educated on bowel and bladder connection multiple times. He continue to requires I & O caths. Will add urecholine to see if this will help voiding function.  He will need to learn to self cath or indwelling foley if unable to void.

## 2012-08-31 ENCOUNTER — Inpatient Hospital Stay (HOSPITAL_COMMUNITY): Payer: Self-pay | Admitting: Occupational Therapy

## 2012-08-31 ENCOUNTER — Inpatient Hospital Stay (HOSPITAL_COMMUNITY): Payer: Self-pay | Admitting: *Deleted

## 2012-08-31 ENCOUNTER — Encounter (HOSPITAL_COMMUNITY): Payer: Self-pay

## 2012-08-31 DIAGNOSIS — L98499 Non-pressure chronic ulcer of skin of other sites with unspecified severity: Secondary | ICD-10-CM

## 2012-08-31 DIAGNOSIS — I739 Peripheral vascular disease, unspecified: Secondary | ICD-10-CM

## 2012-08-31 DIAGNOSIS — S88119A Complete traumatic amputation at level between knee and ankle, unspecified lower leg, initial encounter: Secondary | ICD-10-CM

## 2012-08-31 DIAGNOSIS — E1165 Type 2 diabetes mellitus with hyperglycemia: Secondary | ICD-10-CM

## 2012-08-31 DIAGNOSIS — I10 Essential (primary) hypertension: Secondary | ICD-10-CM

## 2012-08-31 DIAGNOSIS — E348 Other specified endocrine disorders: Secondary | ICD-10-CM

## 2012-08-31 LAB — GLUCOSE, CAPILLARY
Glucose-Capillary: 223 mg/dL — ABNORMAL HIGH (ref 70–99)
Glucose-Capillary: 189 mg/dL — ABNORMAL HIGH (ref 70–99)
Glucose-Capillary: 170 mg/dL — ABNORMAL HIGH (ref 70–99)

## 2012-08-31 MED ORDER — INSULIN GLARGINE 100 UNIT/ML ~~LOC~~ SOLN
25.0000 [IU] | Freq: Every day | SUBCUTANEOUS | Status: DC
  Administered 2012-08-31: 25 [IU] via SUBCUTANEOUS
  Filled 2012-08-31 (×2): qty 0.25

## 2012-08-31 MED ORDER — LIDOCAINE HCL 2 % EX GEL
CUTANEOUS | Status: DC | PRN
  Filled 2012-08-31: qty 5

## 2012-08-31 NOTE — Discharge Summary (Signed)
Physician Discharge Summary  Patient ID: Lawrence [name] MRN: [id number] DOB/AGE: [birth year] 52 y.o.  Admit date: 08/21/2012 Discharge date: 09/01/2012  Discharge Diagnoses:  Principal Problem:   Unilateral complete BKA--right Active Problems:   DM (diabetes mellitus), type 2, uncontrolled   Neuropathy   Urinary retention   Discharged Condition: good.   Significant Diagnostic Studies: No results found.  Labs:  BMET    Component Value Date/Time   NA 139 08/22/2012 0640   K 3.4* 08/22/2012 0640   CL 106 08/22/2012 0640   CO2 23 08/22/2012 0640   GLUCOSE 96 08/22/2012 0640   BUN 24* 08/22/2012 0640   CREATININE 1.21 08/28/2012 0635   CREATININE 2.78* 06/13/2012 0912   CALCIUM 9.5 08/22/2012 0640   GFRNONAA 68* 08/28/2012 0635   GFRAA 79* 08/28/2012 0635       CBC:  Recent Labs Lab 08/26/12 0500 08/28/12 0635  WBC 5.4 4.2  HGB 8.7* 8.4*  HCT 25.9* 24.6*  MCV 78.2 78.6  PLT 140* 144*    CBG:  Recent Labs Lab 08/31/12 0746 08/31/12 1156 08/31/12 1645 08/31/12 2218 09/01/12 0741  GLUCAP 223* 103* 189* 170* 91    Brief HPI:   Lawrence [name] is a 52 y.o. male with history of DM, CKD, nonhealing ulcer right foot with osteomyelitis requiring Ray 05/03/12. Patient with wound dehiscence, poor healing despite VAC and significant small vessel disease. He was admitted on 08/16/12 for R-BKA by Dr. Ophelia Charter. Post op with difficulty voiding requiring in and out caths. He is limited by pain, anxiety and deconditioning. Pain control improving but some confusion reporte. Foley replaced due to retention. Patient was admitted to rehab for progressive therapies.    Hospital Course: Lawrence [name] was admitted to rehab 08/21/2012 for inpatient therapies to consist of PT, ST and OT at least three hours five days a week. Past admission physiatrist, therapy team and rehab RN have worked together to provide customized collaborative inpatient rehab. His diabetes has been monitored with ac/hs cbg  checks and blood sugars are trending upward with increase in po intake. Lantus was increased to 25 units to help with better control.  Pain control has been reasonable with addition of neurontin. R-BKA site is healing well with decrease in edema.   He continues to requires encouragement to utilize multiple laxatives to help with his acute on chronic constipation problems. Foley was discontinued and urecholine was added due to urinary retention. He required in and out caths qid due to inability to void. Repeat urine culture 07/16 was negative. Foley was replaced prior to discharge on 09/01/12 due to continued retention and difficultly with catheterization. He will need follow up with urology for repeat voiding trial and/or intervention as needed. Acute blood loss anemia is stable. Stool guaiac done and was negative.   Rehab course: During patient's stay in rehab weekly team conferences were held to monitor patient's progress, set goals and discuss barriers to discharge. Occupational therapy has focused as ADL tasks and endurance. He requires supervision for bathing and dressing tasks and min-mod assist for drop arm commode transfers. Physical therapy has focused on bed mobility, transfers, w/c propulsion, as well as activity tolerance. He is supervision for tranfers and wheelchair mobility. Family is unable to provide assistance needed and patient was discharged to [facility] for progressive therapies.     Disposition: Skilled Holiday representative.   Diet: Diabetic diet.  Special Instructions: 1. Routine foley care. Follow up with urology for repeat voiding trial.  2. Cleanse wound with soap and water, pat dry and apply compressive dressing daily. 3. Check blood sugars ac/hs and use SSI per protocol for elevated BS. Titrate lantus as indicated.         Future Appointments Provider Department Dept Phone   10/02/2012 10:00 AM [facility]rick Colace, [phone]        Medication List    STOP taking these medications       doxycycline 100 MG tablet  Commonly known as:  VIBRA-TABS     HYDROcodone-acetaminophen 5-325 MG per tablet  Commonly known as:  NORCO/VICODIN      TAKE these medications       acetaminophen 325 MG tablet  Commonly known as:  TYLENOL  Take 1-2 tablets (325-650 mg total) by mouth every 4 (four) hours as needed.     alum & mag hydroxide-simeth 200-200-20 MG/5ML suspension  Commonly known as:  MAALOX/MYLANTA  Take 30 mLs by mouth every 4 (four) hours as needed for indigestion.     aspirin 81 MG tablet  Take 81 mg by mouth every morning.     bisacodyl 10 MG suppository  Commonly known as:  DULCOLAX  Place 1 suppository (10 mg total) rectally daily as needed.     gabapentin 100 MG capsule  Commonly known as:  NEURONTIN  Take 1 capsule (100 mg total) by mouth at bedtime.     insulin glargine 100 UNIT/ML injection  Commonly known as:  LANTUS  Inject 0.25 mLs (25 Units total) into the skin at bedtime.     methocarbamol 500 MG tablet  Commonly known as:  ROBAXIN  Take 1 tablet (500 mg total) by mouth 4 (four) times daily as needed (spasms).     metoprolol tartrate 25 MG tablet  Commonly known as:  LOPRESSOR  Take 1 tablet (25 mg total) by mouth 2 (two) times daily.     oxyCODONE-acetaminophen 5-325 MG per tablet-- Rx # 10 pills.  Commonly known as:  PERCOCET/ROXICET  Take 1-2 tablets by mouth every 4 (four) hours as needed.     senna-docusate 8.6-50 MG per tablet  Commonly known as:  Senokot-S  Take 2 tablets by mouth 2 (two) times daily.     tamsulosin 0.4 MG Caps  Commonly known as:  FLOMAX  Take 2 capsules (0.8 mg total) by mouth daily.       Follow-up Information   Follow up with Erick Colace, MD On 10/02/2012. (Be there at 9:45 for 10 am appointment)    Contact information:   [address] [place] [address] [phone]       Follow up with Eldred Manges, MD On 09/12/2012. (Be  there at 10:15 for post op appointment)    Contact information:   [address] [place] [address] [phone]       Signed: Jacquelynn Cree 09/01/2012, 8:48 AM

## 2012-08-31 NOTE — Progress Notes (Signed)
Patient ID: Lawrence [name], male   DOB: 1961-01-08, 52 y.o.   MRN: [id number] Subjective/Complaints: Urinary retention Pain is now controlled  A 12 point review of systems has been performed and if not noted above is otherwise negative. .  Objective: Vital Signs: Blood pressure 157/70, pulse 73, temperature 98 F (36.7 C), temperature source Oral, resp. rate 20, weight 77.111 kg (170 lb), SpO2 99.00%. No results found. No results found for this basename: WBC, HGB, HCT, PLT,  in the last 72 hours No results found for this basename: NA, K, CL, CO, GLUCOSE, BUN, CREATININE, CALCIUM,  in the last 72 hours CBG (last 3)   Recent Labs  08/30/12 2112 08/31/12 0746 08/31/12 1156  GLUCAP 229* 223* 103*    Wt Readings from Last 3 Encounters:  08/31/12 77.111 kg (170 lb)  08/21/12 74.3 kg (163 lb 12.8 oz)  08/21/12 74.3 kg (163 lb 12.8 oz)    Physical Exam:  Constitutional: He is oriented to person, place, and time. He appears well-nourished. HENT:  Head: Normocephalic and atraumatic.  Eyes: Pupils are equal, round, and reactive to light.  Neck: Normal range of motion. Neck supple.  Cardiovascular: Normal rate and regular rhythm.  No murmur heard.  Pulmonary/Chest: Effort normal and breath sounds normal. No respiratory distress. He has no wheezes.  Abdominal: Soft. Bowel sounds are normal. He exhibits no distension. There is no tenderness.  Musculoskeletal: He exhibits edema, 1 to 2+ both legs.   BUE with muscle wasting distally. Left foot with 2+edema to knees.Neurological: He is alert and oriented to person, place, and time. Intact basic insight and awareness.  Skin: Skin is warm and dry.  Psychiatric: His speech is normal. His affect is blunt. He is withdrawn.  motor strength is 4-5/5 in bilateral deltoid, bicep, tricep, grip. 3+ to 4/5 with Left, hip flexors, knee extensors, ankle dorsi flexion plantarflexion  R LE with BKA has 3-/5 HF  Sensation reduced to sharp touch in both  hands and left foot    Assessment/Plan: 1. Functional deficits secondary to right bka which require 3+ hours per day of interdisciplinary therapy in a comprehensive inpatient rehab setting. Physiatrist is providing close team supervision and 24 hour management of active medical problems listed below. Physiatrist and rehab team continue to assess barriers to discharge/monitor patient progress toward functional and medical goals. Discussed with team, at supervision level. Stable for discharge in the a.m. Can continue IO catheterization at skilled nursing facility. Recommend urology followup in 2-3 weeks as outpatient FIM: FIM - Bathing Bathing Steps Patient Completed: Chest;Right Arm;Left Arm;Abdomen;Front perineal area;Buttocks;Right upper leg;Left upper leg;Left lower leg (including foot) Bathing: 5: Supervision: Safety issues/verbal cues  FIM - Upper Body Dressing/Undressing Upper body dressing/undressing steps patient completed: Thread/unthread right sleeve of pullover shirt/dresss;Thread/unthread left sleeve of pullover shirt/dress;Put head through opening of pull over shirt/dress;Pull shirt over trunk Upper body dressing/undressing: 5: Set-up assist to: Obtain clothing/put away FIM - Lower Body Dressing/Undressing Lower body dressing/undressing steps patient completed: Thread/unthread right pants leg;Thread/unthread left pants leg;Pull pants up/down;Don/Doff left sock;Don/Doff left shoe;Fasten/unfasten left shoe Lower body dressing/undressing: 5: Supervision: Safety issues/verbal cues  FIM - Toileting Toileting steps completed by patient: Adjust clothing prior to toileting;Performs perineal hygiene;Adjust clothing after toileting Toileting: 0: Activity did not occur  FIM - Diplomatic Services operational officer Devices: Bedside commode (drop arm) Toilet Transfers: 4-To toilet/BSC: Min A (steadying Pt. > 75%);3-From toilet/BSC: Mod A (lift or lower assist)  FIM - Physiological scientist Devices:  Sliding board;Arm rests Bed/Chair Transfer: 5: Supine > Sit: Supervision (verbal cues/safety issues);5: Bed > Chair or W/C: Supervision (verbal cues/safety issues);5: Chair or W/C > Bed: Supervision (verbal cues/safety issues);5: Sit > Supine: Supervision (verbal cues/safety issues)  FIM - Locomotion: Wheelchair Distance: 175 Locomotion: Wheelchair: 5: Travels 150 ft or more: maneuvers on rugs and over door sills with supervision, cueing or coaxing FIM - Locomotion: Ambulation Ambulation/Gait Assistance: Not tested (comment) Locomotion: Ambulation: 0: Activity did not occur  Comprehension Comprehension Mode: Auditory Comprehension: 5-Understands basic 90% of the time/requires cueing < 10% of the time  Expression Expression Mode: Verbal Expression: 5-Expresses basic 90% of the time/requires cueing < 10% of the time.  Social Interaction Social Interaction: 5-Interacts appropriately 90% of the time - Needs monitoring or encouragement for participation or interaction.  Problem Solving Problem Solving: 5-Solves basic 90% of the time/requires cueing < 10% of the time  Memory Memory: 5-Recognizes or recalls 90% of the time/requires cueing < 10% of the time  Medical Problem List and Plan:  1. DVT Prophylaxis/Anticoagulation: Pharmaceutical: Lovenox , mild thrombocytopenia , monitor 2. Pain Management: Continue prn oxycodone. Add gabapentin for phantom pain  3. Mood: Noted to have flat affect. Will monitor for now. Provide ego support. LCSW to follow for evaluation.  4. Neuropsych: This patient is capable of making decisions on his own behalf.  5. DM type 2: Will monitor BS with ac/hs cbg checks. Continue Lantus 20 units and use SSI for elevated BS.   -sugars controlled at present 6. Anemia of chronic illness: Recheck Hgb stable    -no active signs of bleeding at present  -heme check stools--negative 7. HTN: Monitor with bid checks. BP  better controlled at present 8. Urinary retention: Continue Flomax, increase dose , add urecholine. UA revealed a few yeast..,ucx negative 9. Constipation: scheduled miralax.   -needs regular regimen 10. Confusion: resolved 11. hypokalemia--replacing  LOS (Days) 10 A FACE TO FACE EVALUATION WAS PERFORMED  Claudette Laws E 08/31/2012 2:25 PM

## 2012-08-31 NOTE — Progress Notes (Signed)
Recreational Therapy Session Note  Patient Details  Name: Lawrence [name] MRN: [id number] Date of Birth: Apr 29, 1960 Today's Date: 08/31/2012 Time:  [phone] Pain: no c/o Skilled Therapeutic Interventions/Progress Updates: Pt propelled w/c on unit from room to gym using BUE's with supervision.  Pt stood with Carley Hammed walker for ball tapping activity alternating UE's for tapping with mod assist for balance.  Pt tolerated standing 3-5 minutes per round before need for rest.  Pt performed slide board transfers with supervision/set up assist and min instructional & questioning cues for safety, sequencing, & technique.  Therapy/Group: Co-Treatment   Abdulkarim Eberlin 08/31/2012, 10:59 AM

## 2012-08-31 NOTE — Progress Notes (Signed)
Occupational Therapy Session Note  Patient Details  Name: Lawrence [name] MRN: [id number] Date of Birth: 03/26/1960  Today's Date: 08/31/2012 Time: [id number] Time Calculation (min): 25 min  Short Term Goals: Week 2:  OT Short Term Goal 1 (Week 2): Pt will perform toilet transfer with min assist to drop arm commode. OT Short Term Goal 2 (Week 2): Pt will perform bathing in sitting with lateral leans with supervision. OT Short Term Goal 3 (Week 2): Pt will perform dressing in sitting with lateral leans and supervision. OT Short Term Goal 5 (Week 2): Pt will peform sit to stand with mod assist consistently in preparation for sit to stand LB selfcare tasks.  Skilled Therapeutic Interventions/Progress Updates:  Upon arrival, patient napping while seated in w/c.  After being awake ~2 min, patient became nauseated then vomited what appeared to be partially digested breakfast food.  Patient reports he thinks it is the medicine he gets for moving his bowels.  RN notified and then reports that patient needs I/O cath so this clinician assisted with w/c-bed transfer via slide board going uphill.  Patient required assist to correctly place board and assist to stabilize surfaces to ensure safe transfer.  Once EOB, patient performed 2 sets of 12 BUE Theraband exercises (5 exercises) and supervision for sit EOB>supine.  All items within reach.  Therapy Documentation Precautions:  Precautions Precautions: Fall Precaution Comments: R BKA Required Braces or Orthoses:  ( ) Restrictions Weight Bearing Restrictions: Yes RUE Weight Bearing: Weight bearing as tolerated LUE Weight Bearing: Weight bearing as tolerated RLE Weight Bearing: Non weight bearing LLE Weight Bearing: Weight bearing as tolerated Pain: Denies pain  Therapy/Group: Individual Therapy  Taffy Delconte 08/31/2012, 12:38 PM

## 2012-08-31 NOTE — Progress Notes (Signed)
Social Work Patient ID: Lawrence [name], male   DOB: 1961-02-04, 52 y.o.   MRN: [id number] Met with pt and sister they have decided to go to [facility] tomorrow.  Will gather paperwork and prepare for discharge.

## 2012-08-31 NOTE — Progress Notes (Signed)
Patient's LBM 7/19, and continue to refuse laxatives.  Patient vomited once with therapy and c/o nausea.  Zofran po given, med effective.  Patient states  miralax causes him to vomit.   Patient requiring  I & O cath with coude catheter q8h.  Last cath at 1200 after (12 hours) 850cc.  Will continue to monitor.

## 2012-08-31 NOTE — Progress Notes (Signed)
NUTRITION FOLLOW UP  Intervention:   1.  General healthful diet; continue to encourage intake as needed. 2.  Nutrition-related meds; continue to offer bowel regimen..   Nutrition Dx:   Increased nutrient needs, ongoing.  Monitor:   1. Food/Beverage; pt meeting >/=90% estimated needs with tolerance. Met, ongoing.  2. Wt/wt change; monitor trends.  Ongoing.   Assessment:   Pt admitted s/p BKA (7/9). Pt intake is 75-100% consistently. Pt with elevated CBGs yesterday. Recommend continue Regular diet with adequate glucose coverage as pt's intake is improved after diet change.  Pt's abdomen remains distended.  Last BM 7/19.   Height: Ht Readings from Last 1 Encounters:  08/21/12 5\' 5"  (1.651 m)    Weight Status:   Wt Readings from Last 1 Encounters:  08/31/12 170 lb (77.111 kg)    Re-estimated needs:  Kcal: 2050-2350 Protein: 90-105g Fluid: >1.8 L/day  Skin: incision  Diet Order: General   Intake/Output Summary (Last 24 hours) at 08/31/12 1320 Last data filed at 08/31/12 1200  Gross per 24 hour  Intake   1440 ml  Output   2400 ml  Net   -960 ml    Last BM: 7/19, taking miralax and senokot daily  Labs:   Recent Labs Lab 08/28/12 0635  CREATININE 1.21    CBG (last 3)   Recent Labs  08/30/12 2112 08/31/12 0746 08/31/12 1156  GLUCAP 229* 223* 103*    Scheduled Meds: . aspirin  81 mg Oral Daily  . bethanechol  25 mg Oral QID  . enoxaparin (LOVENOX) injection  40 mg Subcutaneous Q24H  . gabapentin  100 mg Oral QHS  . insulin aspart  0-9 Units Subcutaneous TID WC  . insulin glargine  20 Units Subcutaneous QHS  . metoprolol tartrate  25 mg Oral BID  . polyethylene glycol  17 g Oral Daily  . senna-docusate  2 tablet Oral QHS  . tamsulosin  0.8 mg Oral Daily    Continuous Infusions:   Loyce Dys, MS RD LDN Clinical Inpatient Dietitian Pager: [phone] Weekend/After hours pager: [phone]

## 2012-08-31 NOTE — Progress Notes (Signed)
Social Work Patient ID: Lawrence [name], male   DOB: [birth year], 52 y.o.   MRN: [id number] Met with pt to find out if he and sister have made a decision regarding which facility he is going to tomorrow. He reports: " I am not ready can I stay until Monday? " MD feels pt is medically ready for transfer tomorrow and has informed pt of this.  Discussed with him he can come up with another discharge option.  Will await return call form sister. Will prepare for pt for discharge tomorrow.

## 2012-08-31 NOTE — Progress Notes (Signed)
Physical Therapy Discharge Summary  Patient Details  Name: Lawrence [name] MRN: [id number] Date of Birth: 05/16/1960  Today's Date: 08/31/2012 Time: 0930-1030 and 1345-1430 Time Calculation (min): 60 min and 45 min  Patient has met 8 of 8 long term goals due to improved activity tolerance, improved balance, improved postural control, increased strength, ability to compensate for deficits, improved awareness and improved coordination.  Patient to discharge at a wheelchair level Supervision.   Patient's care partner not necessary secondary to d/c to SNF to provide the necessary cognitive assistance at discharge.  Reasons goals not met: N/A, all LTGs met  Recommendation:  Patient will benefit from ongoing skilled PT services in skilled nursing facility setting to continue to advance safe functional mobility, address ongoing impairments in activity tolerance, balance, strength, awareness, coordination, overall mobility, and minimize fall risk.  Equipment: No equipment provided  Reasons for discharge: treatment goals met and discharge from hospital  Patient/family agrees with progress made and goals achieved: Yes  Skilled Interventions: Patient performed seated L LAQ with 3" hold and 2# weight x20.  PT Discharge Precautions/Restrictions Precautions Precautions: Fall Precaution Comments: R BKA Restrictions Weight Bearing Restrictions: Yes RLE Weight Bearing: Non weight bearing Pain Pain Assessment Pain Assessment: No/denies pain Pain Score: 0-No pain Vision/Perception  Vision - History Baseline Vision: Wears glasses only for reading Patient Visual Report: No change from baseline  Cognition Overall Cognitive Status: Within Functional Limits for tasks assessed Arousal/Alertness: Awake/alert Orientation Level: Oriented X4 Problem Solving: Appears intact Safety/Judgment: Appears intact Sensation Sensation Light Touch: Appears Intact Proprioception: Appears  Intact Coordination Gross Motor Movements are Fluid and Coordinated: Yes Fine Motor Movements are Fluid and Coordinated: Yes Motor  Motor Motor: Within Functional Limits Motor - Discharge Observations: R BKA  Mobility Bed Mobility Bed Mobility: Supine to Sit;Sit to Supine Supine to Sit: HOB flat;6: Modified independent (Device/Increase time) Sit to Supine: HOB flat;6: Modified independent (Device/Increase time) Transfers Sit to Stand: From chair/3-in-1;With armrests;With upper extremity assist;3: Mod assist;2: Max assist (modA when pulling up; max A when pushing) Sit to Stand Details: Manual facilitation for weight shifting;Manual facilitation for placement;Verbal cues for sequencing;Verbal cues for technique;Verbal cues for precautions/safety Lateral/Scoot Transfers: With armrests removed;With slide board;5: Supervision Lateral/Scoot Transfer Details: Verbal cues for sequencing;Verbal cues for precautions/safety;Verbal cues for technique Locomotion  Ambulation Ambulation: No Ambulation/Gait Assistance: Not tested (comment) Gait Gait: No Stairs / Additional Locomotion Stairs: No Wheelchair Mobility Wheelchair Mobility: Yes Wheelchair Assistance: 5: Financial planner Details: Verbal cues for Engineer, drilling: Both upper extremities Wheelchair Parts Management: Supervision/cueing Distance: 175  Trunk/Postural Assessment  Cervical Assessment Cervical Assessment: Within Functional Limits Thoracic Assessment Thoracic Assessment: Within Functional Limits Lumbar Assessment Lumbar Assessment: Within Functional Limits Postural Control Postural Control: Within Functional Limits  Balance Balance Balance Assessed: Yes Static Sitting Balance Static Sitting - Balance Support: No upper extremity supported;Feet unsupported Static Sitting - Level of Assistance: 7: Independent Dynamic Sitting Balance Dynamic Sitting - Balance Support: Feet  unsupported;No upper extremity supported Dynamic Sitting - Level of Assistance: 5: Stand by assistance Dynamic Sitting - Balance Activities: Lateral lean/weight shifting;Forward lean/weight shifting Static Standing Balance Static Standing - Balance Support: Bilateral upper extremity supported Static Standing - Level of Assistance: 3: Mod assist (with Fara Boros) Static Standing - Comment/# of Minutes: approx 3-5 min each round Dynamic Standing Balance Dynamic Standing - Balance Support: Left upper extremity supported;Right upper extremity supported Dynamic Standing - Level of Assistance: 3: Mod assist Dynamic Standing - Balance Activities: Ball toss Dynamic Standing -  Comments: Ball taps, 4 rounds x3-5 min each; alternating tapping ball back and forth Extremity Assessment  RLE Assessment RLE Assessment: Exceptions to [facility] RLE Strength RLE Overall Strength: Deficits RLE Overall Strength Comments: Grossly 3+/5 for R hip flex, abd and R knee flex/ext LLE Assessment LLE Assessment: Exceptions to [facility] LLE Strength LLE Overall Strength: Deficits;Due to premorbid status LLE Overall Strength Comments: Grossly 3+/5  See FIM for current functional status  Lawrence [name] S Lawrence [name] S. Lawrence [name], PT, DPT 08/31/2012, 2:10 PM

## 2012-08-31 NOTE — Progress Notes (Signed)
Occupational Therapy Discharge Summary  Patient Details  Name: Lawrence [name] MRN: [id number] Date of Birth: [birth year]  Today's Date: 08/31/2012  Patient has met 6 of 7 long term goals due to improved activity tolerance, improved balance and ability to compensate for deficits.  Pt made steady progress with BADLs during this admission.  Pt completes bathing and dressing tasks with supervision at edge of bed.  Pt continues to require max A for sit<>stand and static standing.  Pt requires verbal cues for transfer board placement in preparation for transfersPatient to discharge at overall Supervision level except min-mod assist with drop arm commode transfers.  Patient's care partner did not attend formal education due to discharge plan is to SNF and not to home.  Reasons goals not met: tub bench transfer n/a secondary to d/c to SNF  Recommendation:  Patient will benefit from ongoing skilled OT services in skilled nursing facility setting to continue to advance functional skills in the area of BADL and Reduce care partner burden.  Equipment: No equipment provided discharge to SNF  Reasons for discharge: discharge from hospital  Patient/family agrees with progress made and goals achieved: Yes  OT Discharge Precautions/Restrictions  Precautions Precautions: Fall Precaution Comments: R BKA Restrictions Weight Bearing Restrictions: Yes RLE Weight Bearing: Non weight bearing Pain Pain Assessment Pain Assessment: No/denies pain Pain Score: 0-No pain ADL ADL Eating: Independent Grooming: Modified independent Where Assessed-Grooming: Sitting at sink Upper Body Bathing: Supervision/safety Where Assessed-Upper Body Bathing: Edge of bed Lower Body Bathing: Supervision/safety Where Assessed-Lower Body Bathing: Edge of bed Upper Body Dressing: Supervision/safety Where Assessed-Upper Body Dressing: Edge of bed Lower Body Dressing: Supervision/safety Where Assessed-Lower Body Dressing: Edge  of bed Vision/Perception  Vision - History Baseline Vision: Wears glasses only for reading Patient Visual Report: No change from baseline Vision - Assessment Eye Alignment: Within Functional Limits Vision Assessment: Vision not tested Perception Perception: Within Functional Limits Praxis Praxis: Intact  Cognition Overall Cognitive Status: Within Functional Limits for tasks assessed Orientation Level: Oriented X4 Attention: Sustained Sustained Attention: Appears intact Memory: Appears intact Awareness: Appears intact Problem Solving: Appears intact Safety/Judgment: Appears intact Sensation Sensation Light Touch: Appears Intact Stereognosis: Appears Intact Proprioception: Appears Intact Coordination Gross Motor Movements are Fluid and Coordinated: Yes Fine Motor Movements are Fluid and Coordinated: Yes Motor  Motor Motor: Within Functional Limits Motor - Skilled Clinical Observations: R BKA Trunk/Postural Assessment  Cervical Assessment Cervical Assessment: Within Functional Limits Thoracic Assessment Thoracic Assessment: Within Functional Limits Lumbar Assessment Lumbar Assessment: Within Functional Limits Postural Control Postural Control: Within Functional Limits  Balance Static Sitting Balance Static Sitting - Balance Support: Bilateral upper extremity supported Static Sitting - Level of Assistance: 7: Independent Dynamic Sitting Balance Dynamic Sitting - Balance Support: Feet unsupported Dynamic Sitting - Level of Assistance: 6: Modified independent (Device/Increase time) Dynamic Sitting - Balance Activities: Lateral lean/weight shifting;Forward lean/weight shifting Extremity/Trunk Assessment RUE Assessment RUE Assessment: Exceptions to [facility] RUE Strength RUE Overall Strength Comments: 4/5 overall LUE Assessment LUE Assessment: Exceptions to [facility] LUE Strength LUE Overall Strength Comments: overall 4/5 overall  See FIM for current functional  status  Palma Buster 08/31/2012, 4:49 PM

## 2012-08-31 NOTE — Progress Notes (Signed)
Recreational Therapy Discharge Summary Patient Details  Name: Lawrence [name] MRN: [id number] Date of Birth: 1960-02-26 Today's Date: 08/31/2012  Long term goals set: 1  Long term goals met: 1  Comments on progress toward goals: Pt has made good progress toward goal and is ready for discharge to SNF for 24 hour supervision/assist & continued therapies.    Reasons for discharge: discharge from hospital  Patient/family agrees with progress made and goals achieved: Yes  [name] 08/31/2012, 3:58 PM

## 2012-08-31 NOTE — Progress Notes (Signed)
Occupational Therapy Session Note  Patient Details  Name: Lawrence [name] MRN: [id number] Date of Birth: 04/16/1960  Today's Date: 08/31/2012 Time: 0815-0900 Time Calculation (min): 45 min  Short Term Goals: Week 2:  OT Short Term Goal 1 (Week 2): Pt will perform toilet transfer with min assist to drop arm commode. OT Short Term Goal 2 (Week 2): Pt will perform bathing in sitting with lateral leans with supervision. OT Short Term Goal 3 (Week 2): Pt will perform dressing in sitting with lateral leans and supervision. OT Short Term Goal 5 (Week 2): Pt will peform sit to stand with mod assist consistently in preparation for sit to stand LB selfcare tasks.  Skilled Therapeutic Interventions/Progress Updates:    Pt in bed upon arrival with nursing at bedside.  Nursing attempting to perform bladder scan and cath and patient missed 15 mins therapy.  Pt completed bathing and dressing tasks seated edge of bed with lateral leans to bathe buttocks and pull up pants.  Pt required verbal cues for correct board placement prior to transfer to w/c to complete grooming at sink.  Pt requires extra time to complete all objects.  Pt required max A for sit<>stand from edge of bed.  Focus on activity tolerance, safety awareness, lateral leans, and transfers.  Therapy Documentation Precautions:  Precautions Precautions: Fall Precaution Comments: R BKA Required Braces or Orthoses:  ( ) Restrictions Weight Bearing Restrictions: Yes RUE Weight Bearing: Weight bearing as tolerated LUE Weight Bearing: Weight bearing as tolerated RLE Weight Bearing: Non weight bearing LLE Weight Bearing: Weight bearing as tolerated General: General Amount of Missed OT Time (min): 15 Minutes Pain: Pain Assessment Pain Assessment: No/denies pain Pain Score: 0-No pain  See FIM for current functional status  Therapy/Group: Individual Therapy  Rich Brave 08/31/2012, 9:48 AM

## 2012-09-01 ENCOUNTER — Other Ambulatory Visit: Payer: Self-pay | Admitting: Geriatric Medicine

## 2012-09-01 LAB — GLUCOSE, CAPILLARY: Glucose-Capillary: 91 mg/dL (ref 70–99)

## 2012-09-01 MED ORDER — BISACODYL 10 MG RE SUPP: 10.0000 mg | Freq: Every day | RECTAL | Status: DC | PRN

## 2012-09-01 MED ORDER — GABAPENTIN 100 MG PO CAPS: 100.0000 mg | ORAL_CAPSULE | Freq: Every day | ORAL | Status: DC

## 2012-09-01 MED ORDER — OXYCODONE-ACETAMINOPHEN 5-325 MG PO TABS: 1.0000 | ORAL_TABLET | ORAL | Status: DC | PRN

## 2012-09-01 MED ORDER — ALUM & MAG HYDROXIDE-SIMETH 200-200-20 MG/5ML PO SUSP: 30.0000 mL | ORAL | Status: DC | PRN

## 2012-09-01 MED ORDER — TAMSULOSIN HCL 0.4 MG PO CAPS: 0.8000 mg | ORAL_CAPSULE | Freq: Every day | ORAL | Status: DC

## 2012-09-01 MED ORDER — ACETAMINOPHEN 325 MG PO TABS: 325.0000 mg | ORAL_TABLET | ORAL | Status: DC | PRN

## 2012-09-01 MED ORDER — SENNOSIDES-DOCUSATE SODIUM 8.6-50 MG PO TABS: 2.0000 | ORAL_TABLET | Freq: Two times a day (BID) | ORAL | Status: DC

## 2012-09-01 MED ORDER — SENNOSIDES-DOCUSATE SODIUM 8.6-50 MG PO TABS
2.0000 | ORAL_TABLET | Freq: Two times a day (BID) | ORAL | Status: DC
  Administered 2012-09-01: 2 via ORAL
  Filled 2012-09-01: qty 2

## 2012-09-01 MED ORDER — INSULIN GLARGINE 100 UNIT/ML ~~LOC~~ SOLN: 25.0000 [IU] | Freq: Every day | SUBCUTANEOUS | Status: DC

## 2012-09-01 MED ORDER — METHOCARBAMOL 500 MG PO TABS: 500.0000 mg | ORAL_TABLET | Freq: Four times a day (QID) | ORAL | Status: DC | PRN

## 2012-09-01 NOTE — Plan of Care (Signed)
Problem: RH BLADDER ELIMINATION Goal: RH STG MANAGE BLADDER WITH MEDICATION WITH ASSISTANCE STG Manage Bladder With Medication With max Assistance.  Outcome: Not Met (add Reason) Patient on flomax and urecholine, patient unable to void

## 2012-09-01 NOTE — Plan of Care (Signed)
Problem: RH BOWEL ELIMINATION Goal: RH STG MANAGE BOWEL WITH ASSISTANCE STG Manage Bowel with moderate Assistance.  Outcome: Not Met (add Reason) Patient's LBM 7/19- patient refuse laxatives

## 2012-09-01 NOTE — Plan of Care (Signed)
Problem: RH BLADDER ELIMINATION Goal: RH STG MANAGE BLADDER WITH ASSISTANCE STG Manage Bladder With max Assistance  Outcome: Not Met (add Reason) Patient unable to void, foley catheter placed prior to discharge

## 2012-09-01 NOTE — Plan of Care (Signed)
Problem: RH BOWEL ELIMINATION Goal: RH STG MANAGE BOWEL W/MEDICATION W/ASSISTANCE STG Manage Bowel with Medication mod Assistance.  Outcome: Not Met (add Reason) Patient refuse laxative

## 2012-09-01 NOTE — Progress Notes (Signed)
Patient discharge to [facility] by ambulance at 1150.  Sister at bedside, discharge packet provided.

## 2012-09-01 NOTE — Progress Notes (Signed)
Attempt at [facility] cath this morning with no success meeting resistance. Reported to oncoming nurse. Will continue to monitor.

## 2012-09-01 NOTE — Progress Notes (Signed)
Patient ID: Lawrence [name], male   DOB: [birth year], 52 y.o.   MRN: [id number] Subjective/Complaints: Urinary retention place foley secondary to difficult cath Pain is now controlled  A 12 point review of systems has been performed and if not noted above is otherwise negative. .  Objective: Vital Signs: Blood pressure 149/77, pulse 62, temperature 97.6 F (36.4 C), temperature source Oral, resp. rate 17, weight 77.111 kg (170 lb), SpO2 99.00%. No results found. No results found for this basename: WBC, HGB, HCT, PLT,  in the last 72 hours No results found for this basename: NA, K, CL, CO, GLUCOSE, BUN, CREATININE, CALCIUM,  in the last 72 hours CBG (last 3)   Recent Labs  08/31/12 1645 08/31/12 2218 09/01/12 0741  GLUCAP 189* 170* 91    Wt Readings from Last 3 Encounters:  08/31/12 77.111 kg (170 lb)  08/21/12 74.3 kg (163 lb 12.8 oz)  08/21/12 74.3 kg (163 lb 12.8 oz)    Physical Exam:  Constitutional: He is oriented to person, place, and time. He appears well-nourished. HENT:  Head: Normocephalic and atraumatic.  Eyes: Pupils are equal, round, and reactive to light.  Neck: Normal range of motion. Neck supple.  Cardiovascular: Normal rate and regular rhythm.  No murmur heard.  Pulmonary/Chest: Effort normal and breath sounds normal. No respiratory distress. He has no wheezes.  Abdominal: Soft. Bowel sounds are normal. He exhibits no distension. There is no tenderness.  Musculoskeletal: He exhibits edema, 1 to 2+ both legs.   BUE with muscle wasting distally. Left foot with 2+edema to knees.Neurological: He is alert and oriented to person, place, and time. Intact basic insight and awareness.  Skin: Skin is warm and dry.  Psychiatric: His speech is normal. His affect is blunt. He is withdrawn.  motor strength is 4-5/5 in bilateral deltoid, bicep, tricep, grip. 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 Functional deficits secondary to right bka which require 3+ hours per day of interdisciplinary therapy in a comprehensive inpatient rehab setting.  Discussed with team, at supervision level. Stable for discharge . Can continue foley catheterization at skilled nursing facility. Recommend urology followup in 2-3 weeks as outpatient, D/C urecholine FIM: FIM - Bathing Bathing Steps Patient Completed: Chest;Right Arm;Left Arm;Abdomen;Front perineal area;Buttocks;Right upper leg;Left upper leg;Left lower leg (including foot) Bathing: 5: Supervision: Safety issues/verbal cues  FIM - Upper Body Dressing/Undressing Upper body dressing/undressing steps patient completed: Thread/unthread right sleeve of pullover shirt/dresss;Thread/unthread left sleeve of pullover shirt/dress;Put head through opening of pull over shirt/dress;Pull shirt over trunk Upper body dressing/undressing: 5: Set-up assist to: Obtain clothing/put away FIM - Lower Body Dressing/Undressing Lower body dressing/undressing steps patient completed: Thread/unthread right pants leg;Thread/unthread left pants leg;Pull pants up/down;Don/Doff left sock;Don/Doff left shoe;Fasten/unfasten left shoe Lower body dressing/undressing: 5: Supervision: Safety issues/verbal cues  FIM - Toileting Toileting steps completed by patient: Adjust clothing prior to toileting;Performs perineal hygiene;Adjust clothing after toileting Toileting: 0: Activity did not occur  FIM - Diplomatic Services operational officer Devices: Bedside commode (drop arm) Toilet Transfers: 4-To toilet/BSC: Min A (steadying Pt. > 75%);3-From toilet/BSC: Mod A (lift or lower assist)  FIM - Banker Devices: Sliding board;Arm rests Bed/Chair Transfer: 5: Supine > Sit: Supervision (verbal cues/safety issues);5: Bed > Chair or W/C: Supervision (verbal  cues/safety issues);5: Chair or W/C >  Bed: Supervision (verbal cues/safety issues);5: Sit > Supine: Supervision (verbal cues/safety issues)  FIM - Locomotion: Wheelchair Distance: 175 Locomotion: Wheelchair: 5: Travels 150 ft or more: maneuvers on rugs and over door sills with supervision, cueing or coaxing FIM - Locomotion: Ambulation Ambulation/Gait Assistance: Not tested (comment) Locomotion: Ambulation: 0: Activity did not occur  Comprehension Comprehension Mode: Auditory Comprehension: 5-Understands basic 90% of the time/requires cueing < 10% of the time  Expression Expression Mode: Verbal Expression: 5-Expresses basic 90% of the time/requires cueing < 10% of the time.  Social Interaction Social Interaction: 5-Interacts appropriately 90% of the time - Needs monitoring or encouragement for participation or interaction.  Problem Solving Problem Solving: 5-Solves basic 90% of the time/requires cueing < 10% of the time  Memory Memory: 5-Recognizes or recalls 90% of the time/requires cueing < 10% of the time  Medical Problem List and Plan:  1. DVT Prophylaxis/Anticoagulation: Pharmaceutical: Lovenox , mild thrombocytopenia , monitor 2. Pain Management: Continue prn oxycodone. Add gabapentin for phantom pain  3. Mood: Noted to have flat affect. Will monitor for now. Provide ego support. LCSW to follow for evaluation.  4. Neuropsych: This patient is capable of making decisions on his own behalf.  5. DM type 2: Will monitor BS with ac/hs cbg checks. Continue Lantus 20 units and use SSI for elevated BS.   -sugars controlled at present 6. Anemia of chronic illness: Recheck Hgb stable    -no active signs of bleeding at present  -heme check stools--negative 7. HTN: Monitor with bid checks. BP better controlled at present 8. Urinary retention: Continue Flomax, increase dose ,. UA revealed a few yeast..,ucx negative 9. Constipation: scheduled miralax.   -needs regular regimen 10.  Confusion: resolved 11. hypokalemia--replacing  LOS (Days) 11 A FACE TO FACE EVALUATION WAS PERFORMED  Lawrence [name] 09/01/2012 8:54 AM

## 2012-09-01 NOTE — Progress Notes (Signed)
Patient has urinary retention, difficulty cathing patient this AM.  Order foley catheter by Dr. Doroteo Bradford. Foley catheter in place draining, patient tolerate well.  Patient going to [facility].

## 2012-09-01 NOTE — Progress Notes (Signed)
Social Work Discharge Note Discharge Note  The overall goal for the admission was met for:   Discharge location: Yes-[facility]  Length of Stay: Yes-11 DAYS  Discharge activity level: Yes-SUPERVISION W/C LEVEL  Home/community participation: Yes  Services provided included: MD, RD, PT, OT, RN, TR, Pharmacy and SW  Financial Services: Other: PENDING MEDICAID  Follow-up services arranged: Other: NHP  Comments (or additional information):PT WANTS TO GET STRONGER AND RETURN HOME-CONT TO WORK ON DIS AND MA PROCESS  Patient/Family verbalized understanding of follow-up arrangements: Yes  Individual responsible for coordination of the follow-up plan: SELF & SUSIE-SISTER  Confirmed correct DME delivered: Lucy Chris 09/01/2012    Lucy Chris

## 2012-09-03 ENCOUNTER — Non-Acute Institutional Stay (SKILLED_NURSING_FACILITY): Payer: Medicaid Other | Admitting: Internal Medicine

## 2012-09-03 DIAGNOSIS — M869 Osteomyelitis, unspecified: Secondary | ICD-10-CM

## 2012-09-03 DIAGNOSIS — R339 Retention of urine, unspecified: Secondary | ICD-10-CM

## 2012-09-03 DIAGNOSIS — E1165 Type 2 diabetes mellitus with hyperglycemia: Secondary | ICD-10-CM

## 2012-09-03 DIAGNOSIS — E1169 Type 2 diabetes mellitus with other specified complication: Secondary | ICD-10-CM

## 2012-09-03 DIAGNOSIS — E1129 Type 2 diabetes mellitus with other diabetic kidney complication: Secondary | ICD-10-CM

## 2012-09-03 DIAGNOSIS — M9080 Osteopathy in diseases classified elsewhere, unspecified site: Secondary | ICD-10-CM

## 2012-09-24 NOTE — Progress Notes (Signed)
Patient ID: Lawrence [name], male   DOB: [birth year], 52 y.o.   MRN: [id number]           HISTORY & PHYSICAL  DATE:  09/01/2012  FACILITY: [facility]   LEVEL OF CARE:   SNF   CHIEF COMPLAINT:  Admission to SNF, post stay at [facility] in the inpatient unit, 08/16/2012 through 08/21/2012, and then 08/21/2012 through 09/01/2012 in rehabilitation.    HISTORY OF PRESENT ILLNESS:  This is a patient who  apparently has had problems with a wound on his right first toe, diagnosed in March of this year.  He subsequently underwent removal of the toe.  However, things did not heal.  He was treated aggressively, [facility] where he developed osteomyelitis of the right second toe and underwent a right below-knee amputation.  He was admitted to hospital this time for this procedure.    This was done by Dr. Ophelia Charter.  Subsequently, he was transferred to inpatient rehab.  It was felt that he could benefit from a stay in physical therapy.    PAST MEDICAL HISTORY/PROBLEM LIST:  Diabetes, which is type 2 according to the patient, although his diabetes was diagnosed late in his teen years.  He was on oral agents for a while.  He is currently on insulin, was taking Lantus 20 U at night at home.    Chronic renal insufficiency.    Hypertension.    Hypercholesterolemia.    History of depression.    Postoperative nausea and vomiting.    Urinary retention on this admission, although he admits to no prior voiding difficulties.    Apparently, they were doing in-and-out catheterizations three times a day in the hospital.  He now has a Foley catheter in place.  He is  apparently seen by Dr. Brunilda Payor in [facility].    LABORATORY DATA:  Review of available lab work:   BUN 24, creatinine 1.21, albumin 2.2.    LDL 60.    White count 4.2, hemoglobin 8.4 on 08/28/2012, platelets 144.  This is normochromic, normocytic.  Differential count looks normal.    Urinalysis showed slight proteinuria,  certainly not nephrotic.    CURRENT MEDICATIONS:  Discharge medications include:   ASA 81 q.d.   Gabapentin/Neurontin 100 at bedtime.    Lantus 25 U under the skin at bedtime.   Robaxin 1 tablet by mouth four times a day.     Metoprolol 25 b.i.d.    Oxycodone 5/325.    Flomax 0.8 q.d.   Senna-S 2 tablets two times daily.    SOCIAL HISTORY:  HOUSING:  The patient tells me he lives at home with a 54 year-old, mentally challenged uncle.   FUNCTIONAL STATUS:  He tells me he was independent.  However, recently he has been mostly bedbound in an effort to keep pressure off his wounds on his right foot.  He has been told he will need to do sliding bar transfers.  His sister, who is in the room, states that the uncle will not be able to assist with this.    REVIEW OF SYSTEMS:    CHEST/RESPIRATORY:  No cough.  No shortness of breath.  He was not a smoker.   CARDIAC:   No exertional chest pain.   GI:  He  apparently has not had a bowel movement in a week.   No abdominal pain, nausea or vomiting.   GU:  He does not describe voiding difficulties.   Apparently  in the hospital, he simply could not pass his water.  As mentioned, he had been doing in-and-out caths for 600 cc.   ENDOCRINE:  At one point, he had a hemoglobin A1c of 12.9, although this seems to have come down over the course of this year.    PHYSICAL EXAMINATION:   GENERAL APPEARANCE:  The patient is not in any distress.  He has a pale, somewhat waxy complexion.   CHEST/RESPIRATORY:  Clear air entry bilaterally.   CARDIOVASCULAR:  CARDIAC:   Heart sounds are normal.   GASTROINTESTINAL:  ABDOMEN:   Obese.  LIVER/SPLEEN/KIDNEYS:  No liver, no spleen.  No tenderness.   GENITOURINARY:  BLADDER:   Foley catheter in place.   MUSCULOSKELETAL:   EXTREMITIES:  Marked muscular wasting in all muscle groups.   NEUROLOGICAL:    DEEP TENDON REFLEXES:  He is diffusely hyporeflexic.   SENSATION/STRENGTH:  Lower extremity weakness at 4/5.    His upper extremity strength seems intact.   CIRCULATION:  ARTERIAL:  Left lower extremity:  Peripheral pulses are palpable.  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    Type 2 diabetes with diabetic neuropathy.  He has mild nephropathy.    Severe hypoalbuminemia.  It does not appear that he is nephrotic.  This probably reflects poor oral intake while he was ill for several months.    Urinary retention, postoperative nausea and vomiting, and constipation/impaction.   I would have to wonder about diabetic autonomic neuropathy in this setting.  He may actually need postural blood pressures checked.   Chronic renal insufficiency, although this does not appear to be too severe with a creatinine of 1.21 on 08/28/2012.    Anemia.  His hemoglobin on 08/28/2012 was 8.4.  The cause of this is not completely clear.  However, it is likely to be chronic disease, probably a component of anemia of chronic malnutrition.    CPT CODE: 16109

## 2012-10-02 ENCOUNTER — Inpatient Hospital Stay: Payer: Self-pay | Admitting: Physical Medicine & Rehabilitation

## 2012-10-02 ENCOUNTER — Encounter: Payer: MEDICAID | Attending: Physical Medicine & Rehabilitation

## 2012-10-04 ENCOUNTER — Emergency Department (HOSPITAL_COMMUNITY): Payer: Medicaid Other

## 2012-10-04 ENCOUNTER — Encounter (HOSPITAL_COMMUNITY): Payer: Self-pay | Admitting: Emergency Medicine

## 2012-10-04 ENCOUNTER — Emergency Department (HOSPITAL_COMMUNITY): Payer: Medicaid Other | Admitting: *Deleted

## 2012-10-04 ENCOUNTER — Ambulatory Visit (HOSPITAL_COMMUNITY)
Admission: EM | Admit: 2012-10-04 | Discharge: 2012-10-05 | Disposition: A | Discharge status: Skilled Nursing Facility | Payer: Medicaid Other | Attending: Orthopaedic Surgery | Admitting: Orthopaedic Surgery

## 2012-10-04 ENCOUNTER — Encounter (HOSPITAL_COMMUNITY): Payer: Self-pay | Admitting: *Deleted

## 2012-10-04 ENCOUNTER — Non-Acute Institutional Stay (SKILLED_NURSING_FACILITY): Payer: Medicaid Other | Admitting: Adult Health

## 2012-10-04 ENCOUNTER — Encounter (HOSPITAL_COMMUNITY)
Admission: EM | Disposition: A | Discharge status: Skilled Nursing Facility | Payer: Self-pay | Source: Home / Self Care | Attending: Emergency Medicine

## 2012-10-04 DIAGNOSIS — I129 Hypertensive chronic kidney disease with stage 1 through stage 4 chronic kidney disease, or unspecified chronic kidney disease: Secondary | ICD-10-CM | POA: Insufficient documentation

## 2012-10-04 DIAGNOSIS — E7800 Pure hypercholesterolemia, unspecified: Secondary | ICD-10-CM | POA: Insufficient documentation

## 2012-10-04 DIAGNOSIS — T8131XA Disruption of external operation (surgical) wound, not elsewhere classified, initial encounter: Secondary | ICD-10-CM

## 2012-10-04 DIAGNOSIS — N189 Chronic kidney disease, unspecified: Secondary | ICD-10-CM | POA: Insufficient documentation

## 2012-10-04 DIAGNOSIS — F329 Major depressive disorder, single episode, unspecified: Secondary | ICD-10-CM | POA: Insufficient documentation

## 2012-10-04 DIAGNOSIS — F3289 Other specified depressive episodes: Secondary | ICD-10-CM | POA: Insufficient documentation

## 2012-10-04 DIAGNOSIS — T8781 Dehiscence of amputation stump: Secondary | ICD-10-CM | POA: Diagnosis present

## 2012-10-04 DIAGNOSIS — E119 Type 2 diabetes mellitus without complications: Secondary | ICD-10-CM | POA: Insufficient documentation

## 2012-10-04 DIAGNOSIS — W19XXXA Unspecified fall, initial encounter: Secondary | ICD-10-CM | POA: Insufficient documentation

## 2012-10-04 DIAGNOSIS — Z79899 Other long term (current) drug therapy: Secondary | ICD-10-CM | POA: Insufficient documentation

## 2012-10-04 DIAGNOSIS — Z794 Long term (current) use of insulin: Secondary | ICD-10-CM | POA: Insufficient documentation

## 2012-10-04 DIAGNOSIS — T8789 Other complications of amputation stump: Secondary | ICD-10-CM | POA: Insufficient documentation

## 2012-10-04 HISTORY — PX: AMPUTATION: SHX166

## 2012-10-04 LAB — BASIC METABOLIC PANEL
CO2: 23 mEq/L (ref 19–32)
Calcium: 9.5 mg/dL (ref 8.4–10.5)
Chloride: 105 mEq/L (ref 96–112)
Potassium: 4.3 mEq/L (ref 3.5–5.1)
Sodium: 137 mEq/L (ref 135–145)

## 2012-10-04 LAB — GLUCOSE, CAPILLARY
Glucose-Capillary: 182 mg/dL — ABNORMAL HIGH (ref 70–99)
Glucose-Capillary: 262 mg/dL — ABNORMAL HIGH (ref 70–99)

## 2012-10-04 LAB — CBC
Platelets: 148 10*3/uL — ABNORMAL LOW (ref 150–400)
RBC: 3.85 MIL/uL — ABNORMAL LOW (ref 4.22–5.81)
WBC: 6.2 10*3/uL (ref 4.0–10.5)

## 2012-10-04 SURGERY — AMPUTATION BELOW KNEE
Anesthesia: General | Site: Leg Lower | Laterality: Right | Wound class: Contaminated

## 2012-10-04 MED ORDER — GABAPENTIN 100 MG PO CAPS
100.0000 mg | ORAL_CAPSULE | Freq: Every day | ORAL | Status: DC
  Administered 2012-10-04: 100 mg via ORAL
  Filled 2012-10-04 (×2): qty 1

## 2012-10-04 MED ORDER — BISACODYL 10 MG RE SUPP: 10.0000 mg | Freq: Every day | RECTAL | Status: DC | PRN

## 2012-10-04 MED ORDER — INSULIN ASPART 100 UNIT/ML ~~LOC~~ SOLN: 4.0000 [IU] | Freq: Three times a day (TID) | SUBCUTANEOUS | Status: DC

## 2012-10-04 MED ORDER — MORPHINE SULFATE 2 MG/ML IJ SOLN: 2.0000 mg | INTRAMUSCULAR | Status: DC | PRN

## 2012-10-04 MED ORDER — CEFAZOLIN SODIUM-DEXTROSE 2-3 GM-% IV SOLR
INTRAVENOUS | Status: DC | PRN
  Administered 2012-10-04: 2 g via INTRAVENOUS

## 2012-10-04 MED ORDER — LIDOCAINE HCL (CARDIAC) 20 MG/ML IV SOLN
INTRAVENOUS | Status: DC | PRN
  Administered 2012-10-04: 60 mg via INTRAVENOUS

## 2012-10-04 MED ORDER — ONDANSETRON HCL 4 MG/2ML IJ SOLN: 4.0000 mg | Freq: Four times a day (QID) | INTRAMUSCULAR | Status: DC | PRN

## 2012-10-04 MED ORDER — INSULIN GLARGINE 100 UNIT/ML ~~LOC~~ SOLN
25.0000 [IU] | Freq: Every day | SUBCUTANEOUS | Status: DC
  Administered 2012-10-04: 25 [IU] via SUBCUTANEOUS
  Filled 2012-10-04 (×2): qty 0.25

## 2012-10-04 MED ORDER — SENNOSIDES-DOCUSATE SODIUM 8.6-50 MG PO TABS
2.0000 | ORAL_TABLET | Freq: Two times a day (BID) | ORAL | Status: DC
  Administered 2012-10-04 – 2012-10-05 (×2): 2 via ORAL
  Filled 2012-10-04 (×2): qty 2

## 2012-10-04 MED ORDER — SODIUM CHLORIDE 0.9 % IV SOLN
INTRAVENOUS | Status: DC
  Administered 2012-10-04: via INTRAVENOUS

## 2012-10-04 MED ORDER — CEFAZOLIN SODIUM 1-5 GM-% IV SOLN
1.0000 g | Freq: Three times a day (TID) | INTRAVENOUS | Status: AC
  Administered 2012-10-04 – 2012-10-05 (×3): 1 g via INTRAVENOUS
  Filled 2012-10-04 (×3): qty 50

## 2012-10-04 MED ORDER — METOCLOPRAMIDE HCL 10 MG PO TABS: 5.0000 mg | ORAL_TABLET | Freq: Three times a day (TID) | ORAL | Status: DC | PRN

## 2012-10-04 MED ORDER — ONDANSETRON HCL 4 MG PO TABS: 4.0000 mg | ORAL_TABLET | Freq: Four times a day (QID) | ORAL | Status: DC | PRN

## 2012-10-04 MED ORDER — LACTATED RINGERS IV SOLN
INTRAVENOUS | Status: DC | PRN
  Administered 2012-10-04: 18:00:00 via INTRAVENOUS

## 2012-10-04 MED ORDER — INSULIN ASPART 100 UNIT/ML ~~LOC~~ SOLN
0.0000 [IU] | Freq: Three times a day (TID) | SUBCUTANEOUS | Status: DC
  Administered 2012-10-05: 2 [IU] via SUBCUTANEOUS
  Administered 2012-10-05: 7 [IU] via SUBCUTANEOUS

## 2012-10-04 MED ORDER — METHOCARBAMOL 500 MG PO TABS: 500.0000 mg | ORAL_TABLET | Freq: Four times a day (QID) | ORAL | Status: DC | PRN

## 2012-10-04 MED ORDER — DEXAMETHASONE SODIUM PHOSPHATE 4 MG/ML IJ SOLN
INTRAMUSCULAR | Status: DC | PRN
  Administered 2012-10-04: 4 mg via INTRAVENOUS

## 2012-10-04 MED ORDER — TAMSULOSIN HCL 0.4 MG PO CAPS
0.8000 mg | ORAL_CAPSULE | Freq: Every day | ORAL | Status: DC
  Administered 2012-10-05: 0.8 mg via ORAL
  Filled 2012-10-04: qty 2

## 2012-10-04 MED ORDER — LACTATED RINGERS IV SOLN
INTRAVENOUS | Status: DC
  Administered 2012-10-04: 17:00:00 via INTRAVENOUS

## 2012-10-04 MED ORDER — PROPOFOL 10 MG/ML IV BOLUS
INTRAVENOUS | Status: DC | PRN
  Administered 2012-10-04: 150 mg via INTRAVENOUS

## 2012-10-04 MED ORDER — CEFAZOLIN SODIUM-DEXTROSE 2-3 GM-% IV SOLR
2.0000 g | INTRAVENOUS | Status: AC
  Administered 2012-10-04: 2 g via INTRAVENOUS
  Filled 2012-10-04: qty 50

## 2012-10-04 MED ORDER — FENTANYL CITRATE 0.05 MG/ML IJ SOLN: 25.0000 ug | INTRAMUSCULAR | Status: DC | PRN

## 2012-10-04 MED ORDER — OXYCODONE HCL 5 MG/5ML PO SOLN: 5.0000 mg | Freq: Once | ORAL | Status: DC | PRN

## 2012-10-04 MED ORDER — METOCLOPRAMIDE HCL 5 MG/ML IJ SOLN
INTRAMUSCULAR | Status: DC | PRN
  Administered 2012-10-04: 10 mg via INTRAVENOUS

## 2012-10-04 MED ORDER — POLYETHYLENE GLYCOL 3350 17 G PO PACK
17.0000 g | PACK | Freq: Every day | ORAL | Status: DC
  Filled 2012-10-04: qty 1

## 2012-10-04 MED ORDER — MIDAZOLAM HCL 5 MG/5ML IJ SOLN
INTRAMUSCULAR | Status: DC | PRN
  Administered 2012-10-04: 2 mg via INTRAVENOUS

## 2012-10-04 MED ORDER — ASPIRIN 81 MG PO TABS: 81.0000 mg | ORAL_TABLET | Freq: Every morning | ORAL | Status: DC

## 2012-10-04 MED ORDER — METOPROLOL TARTRATE 25 MG PO TABS
25.0000 mg | ORAL_TABLET | Freq: Two times a day (BID) | ORAL | Status: DC
Start: 2012-10-04 — End: 2012-10-05
  Administered 2012-10-04 – 2012-10-05 (×2): 25 mg via ORAL
  Filled 2012-10-04 (×3): qty 1

## 2012-10-04 MED ORDER — OXYCODONE-ACETAMINOPHEN 5-325 MG PO TABS
2.0000 | ORAL_TABLET | ORAL | Status: DC | PRN
  Administered 2012-10-04 – 2012-10-05 (×3): 2 via ORAL
  Filled 2012-10-04 (×3): qty 2

## 2012-10-04 MED ORDER — FENTANYL CITRATE 0.05 MG/ML IJ SOLN
INTRAMUSCULAR | Status: DC | PRN
  Administered 2012-10-04 (×3): 50 ug via INTRAVENOUS

## 2012-10-04 MED ORDER — ONDANSETRON HCL 4 MG/2ML IJ SOLN
INTRAMUSCULAR | Status: DC | PRN
  Administered 2012-10-04: 4 mg via INTRAVENOUS

## 2012-10-04 MED ORDER — OXYCODONE HCL 5 MG PO TABS: 5.0000 mg | ORAL_TABLET | Freq: Once | ORAL | Status: DC | PRN

## 2012-10-04 MED ORDER — ASPIRIN EC 81 MG PO TBEC
81.0000 mg | DELAYED_RELEASE_TABLET | Freq: Every day | ORAL | Status: DC
  Administered 2012-10-05: 81 mg via ORAL
  Filled 2012-10-04: qty 1

## 2012-10-04 MED ORDER — METOCLOPRAMIDE HCL 5 MG/ML IJ SOLN: 5.0000 mg | Freq: Three times a day (TID) | INTRAMUSCULAR | Status: DC | PRN

## 2012-10-04 MED ORDER — INSULIN GLARGINE 100 UNIT/ML ~~LOC~~ SOLN
25.0000 [IU] | Freq: Every day | SUBCUTANEOUS | Status: DC
Start: 2012-10-04 — End: 2012-10-04
  Filled 2012-10-04: qty 0.25

## 2012-10-04 SURGICAL SUPPLY — 52 items
BANDAGE ELASTIC 4 VELCRO ST LF (GAUZE/BANDAGES/DRESSINGS) ×2 IMPLANT
BANDAGE ELASTIC 6 VELCRO ST LF (GAUZE/BANDAGES/DRESSINGS) ×2 IMPLANT
BANDAGE ESMARK 6X9 LF (GAUZE/BANDAGES/DRESSINGS) IMPLANT
BANDAGE GAUZE ELAST BULKY 4 IN (GAUZE/BANDAGES/DRESSINGS) ×4 IMPLANT
BLADE SAW SAG 29X58X.64 (BLADE) ×2 IMPLANT
BNDG ADH 5X4 AIR PERM ELC (GAUZE/BANDAGES/DRESSINGS) ×1
BNDG COHESIVE 4X5 TAN STRL (GAUZE/BANDAGES/DRESSINGS) ×2 IMPLANT
BNDG COHESIVE 4X5 WHT NS (GAUZE/BANDAGES/DRESSINGS) ×2 IMPLANT
BNDG COHESIVE 6X5 TAN STRL LF (GAUZE/BANDAGES/DRESSINGS) ×2 IMPLANT
BNDG ESMARK 6X9 LF (GAUZE/BANDAGES/DRESSINGS)
CLEANER TIP ELECTROSURG 2X2 (MISCELLANEOUS) ×2 IMPLANT
CLOTH BEACON ORANGE TIMEOUT ST (SAFETY) ×2 IMPLANT
COVER SURGICAL LIGHT HANDLE (MISCELLANEOUS) ×2 IMPLANT
CUFF TOURNIQUET SINGLE 34IN LL (TOURNIQUET CUFF) IMPLANT
CUFF TOURNIQUET SINGLE 44IN (TOURNIQUET CUFF) IMPLANT
DRAIN PENROSE 1/2X12 LTX STRL (WOUND CARE) IMPLANT
DRAPE EXTREMITY T 121X128X90 (DRAPE) ×2 IMPLANT
DRAPE INCISE IOBAN 66X45 STRL (DRAPES) ×2 IMPLANT
DRAPE PROXIMA HALF (DRAPES) ×4 IMPLANT
DRAPE U-SHAPE 47X51 STRL (DRAPES) ×2 IMPLANT
DRSG PAD ABDOMINAL 8X10 ST (GAUZE/BANDAGES/DRESSINGS) ×2 IMPLANT
DURAPREP 26ML APPLICATOR (WOUND CARE) ×2 IMPLANT
EVACUATOR 1/8 PVC DRAIN (DRAIN) IMPLANT
GAUZE XEROFORM 5X9 LF (GAUZE/BANDAGES/DRESSINGS) ×2 IMPLANT
GLOVE BIOGEL PI IND STRL 7.5 (GLOVE) ×1 IMPLANT
GLOVE BIOGEL PI IND STRL 8 (GLOVE) ×1 IMPLANT
GLOVE BIOGEL PI INDICATOR 7.5 (GLOVE) ×1
GLOVE BIOGEL PI INDICATOR 8 (GLOVE) ×1
GLOVE ECLIPSE 7.0 STRL STRAW (GLOVE) ×2 IMPLANT
GLOVE ORTHO TXT STRL SZ7.5 (GLOVE) ×2 IMPLANT
GOWN PREVENTION PLUS LG XLONG (DISPOSABLE) IMPLANT
GOWN PREVENTION PLUS XLARGE (GOWN DISPOSABLE) ×2 IMPLANT
GOWN STRL NON-REIN LRG LVL3 (GOWN DISPOSABLE) ×2 IMPLANT
KIT BASIN OR (CUSTOM PROCEDURE TRAY) ×2 IMPLANT
KIT ROOM TURNOVER OR (KITS) ×2 IMPLANT
MANIFOLD NEPTUNE II (INSTRUMENTS) ×2 IMPLANT
NEEDLE MAYO TROCAR (NEEDLE) ×2 IMPLANT
NS IRRIG 1000ML POUR BTL (IV SOLUTION) ×2 IMPLANT
PACK GENERAL/GYN (CUSTOM PROCEDURE TRAY) ×2 IMPLANT
PAD ARMBOARD 7.5X6 YLW CONV (MISCELLANEOUS) ×4 IMPLANT
SPONGE GAUZE 4X4 12PLY (GAUZE/BANDAGES/DRESSINGS) ×2 IMPLANT
SPONGE LAP 18X18 X RAY DECT (DISPOSABLE) IMPLANT
STAPLER VISISTAT 35W (STAPLE) ×2 IMPLANT
STOCKINETTE IMPERVIOUS LG (DRAPES) IMPLANT
SUT SILK 3 0 SH CR/8 (SUTURE) ×2 IMPLANT
SUT VIC AB 0 CT1 27 (SUTURE) ×2
SUT VIC AB 0 CT1 27XBRD ANBCTR (SUTURE) ×1 IMPLANT
SUT VICRYL 0 TIES 12 18 (SUTURE) ×2 IMPLANT
SUT VICRYL AB 2 0 TIES (SUTURE) ×2 IMPLANT
TOWEL OR 17X24 6PK STRL BLUE (TOWEL DISPOSABLE) ×2 IMPLANT
TOWEL OR 17X26 10 PK STRL BLUE (TOWEL DISPOSABLE) ×2 IMPLANT
WATER STERILE IRR 1000ML POUR (IV SOLUTION) ×2 IMPLANT

## 2012-10-04 NOTE — Anesthesia Postprocedure Evaluation (Signed)
Anesthesia Post Note  Patient: Lawrence [name]  Procedure(s) Performed: Procedure(s) (LRB): AMPUTATION BELOW KNEE REVISION (Right)  Anesthesia type: General  Patient location: PACU  Post pain: Pain level controlled  Post assessment: Patient's Cardiovascular Status Stable  Last Vitals:  Filed Vitals:   10/04/12 1915  BP: 140/60  Pulse: 69  Temp:   Resp: 13    Post vital signs: Reviewed and stable  Level of consciousness: alert  Complications: No apparent anesthesia complications

## 2012-10-04 NOTE — ED Notes (Signed)
Pt's family requesting anti-anxiety meds for pt. Will make MD aware.

## 2012-10-04 NOTE — Brief Op Note (Addendum)
10/04/2012  6:44 PM  PATIENT:  Roseanna Rainbow Adamcik  52 y.o. male  PRE-OPERATIVE DIAGNOSIS:  Right  bka dehissence  POST-OPERATIVE DIAGNOSIS:  same  PROCEDURE:  Procedure(s): AMPUTATION BELOW KNEE REVISION (Right)  SURGEON:  Surgeon(s) and Role:    * Eldred Manges, MD - Primary  PHYSICIAN ASSISTANT:   ASSISTANTS: none   ANESTHESIA:   general  EBL:  Total I/O In: 600 [I.V.:600] Out: -   BLOOD ADMINISTERED:none  DRAINS: none   LOCAL MEDICATIONS USED:  NONE  SPECIMEN:  No Specimen  DISPOSITION OF SPECIMEN:  N/A  COUNTS:  YES  TOURNIQUET:  * No tourniquets in log *  DICTATION: .Other Dictation: Dictation Number [id number]  PLAN OF CARE: Admit for overnight observation  PATIENT DISPOSITION:  PACU - hemodynamically stable.   Delay start of Pharmacological VTE agent (>24hrs) due to surgical blood loss or risk of bleeding: not applicable

## 2012-10-04 NOTE — Anesthesia Preprocedure Evaluation (Addendum)
Anesthesia Evaluation  Patient identified by MRN, date of birth, ID band Patient awake    Reviewed: Allergy & Precautions, H&P , NPO status , Patient's Chart, lab work & pertinent test results  History of Anesthesia Complications (+) PONV  Airway Mallampati: II  Neck ROM: full    Dental   Pulmonary          Cardiovascular hypertension,     Neuro/Psych Depression    GI/Hepatic   Endo/Other  diabetes, Type 2  Renal/GU Renal disease     Musculoskeletal   Abdominal   Peds  Hematology   Anesthesia Other Findings   Reproductive/Obstetrics                          Anesthesia Physical Anesthesia Plan  ASA: III  Anesthesia Plan: General   Post-op Pain Management:    Induction: Intravenous  Airway Management Planned: LMA  Additional Equipment:   Intra-op Plan:   Post-operative Plan:   Informed Consent: I have reviewed the patients History and Physical, chart, labs and discussed the procedure including the risks, benefits and alternatives for the proposed anesthesia with the patient or authorized representative who has indicated his/her understanding and acceptance.     Plan Discussed with: CRNA, Anesthesiologist and Surgeon  Anesthesia Plan Comments:         Anesthesia Quick Evaluation

## 2012-10-04 NOTE — ED Notes (Signed)
Pt sts he doesn't need meds for anxiety. Pt told he will be going to surgery. Pt undressed and in gown.

## 2012-10-04 NOTE — Op Note (Signed)
Test test  Preop diagnosis right BKA Dehiscence  Postop diagnosis: Same  Procedure: Right BKA irrigation and reclosure of subcutaneous tissue and skin  Surgeon: Annell Greening M.D.  Anesthesia Gen.  Brief history a 52 year old male underwent BK amputation for foot necrosis and gangrene annulus at a skilled nursing facility. 2 weeks of therapy his therapy was stopped and he was in the process after two-week gap of 3 starting physical therapy so he would be strong for his prosthesis. Therapy was restarted and after two-week gap as he started therapy he said he slipped fell landed on the stump is postop open. He presented back to the emergency room and is brought to the operating room on an urgent basis for reclosure.  Procedure after induction general anesthesia proximal thigh tourniquet 10:15 drape, leg was prepped with Betadine scrub and solution usual extremity sheets and drapes were applied. Timeout procedure was completed. The stump was irrigated copiously the liver saline solution. Clots were removed. Bone was covered the posterior compartment fascia was still attached to the anterior tibial periosteum.. There was no evidence of purulence tissue was satisfactory. Copious irrigation some dermis skin and tissue was peeled away. Interrupted subcutaneous 2-0 Vicryl sutures are placed for 5 sutures to reapproximate the subtendinous tissue followed by 2-0 nylon interrupted horizontal mattress sutures with the sutures tied on the anterior proximal skin. Xeroform 4 x 4's ABDs Curlex and Coban was applied postoperatively. Patient tolerated procedure well transferred to her room in stable condition.  Annell Greening M.D. thanks

## 2012-10-04 NOTE — ED Notes (Addendum)
Pt has BTK amputation of right foot, was doing physical therapy and his left leg gave out, fell onto stump of right leg. Was caught by physical therapist. Started bleeding, per ems, bleeding controlled and bandage applied. Pt denies hitting knees, arms, head or anywhere else. pt In NAD. Denies LOC. Amputation was performed July 9th, 2014.

## 2012-10-04 NOTE — ED Notes (Signed)
Pt's family requested for pt's ortho doctor, Dr.Yates, to be consulted

## 2012-10-04 NOTE — Anesthesia Procedure Notes (Signed)
Procedure Name: LMA Insertion Date/Time: 10/04/2012 6:10 PM Performed by: Orvilla Fus A Pre-anesthesia Checklist: Patient identified, Emergency Drugs available, Suction available, Patient being monitored and Timeout performed Patient Re-evaluated:Patient Re-evaluated prior to inductionOxygen Delivery Method: Circle system utilized Preoxygenation: Pre-oxygenation with 100% oxygen Intubation Type: IV induction Ventilation: Mask ventilation without difficulty LMA: LMA inserted LMA Size: 5.0 Number of attempts: 1 Placement Confirmation: positive ETCO2 and breath sounds checked- equal and bilateral Tube secured with: Tape Dental Injury: Teeth and Oropharynx as per pre-operative assessment

## 2012-10-04 NOTE — ED Provider Notes (Signed)
CSN: [id number]     Arrival date & time 10/04/12  1210 History   First MD Initiated Contact with Patient 10/04/12 1227     Chief Complaint  Patient presents with  . Fall   (Consider location/radiation/quality/duration/timing/severity/associated sxs/prior Treatment) Patient is a 52 y.o. male presenting with fall. The history is provided by the patient.  Fall This is a new problem. The current episode started 1 to 2 hours ago. Episode frequency: once. The problem has not changed since onset.Pertinent negatives include no chest pain, no abdominal pain, no headaches and no shortness of breath. Nothing aggravates the symptoms. Nothing relieves the symptoms. He has tried nothing for the symptoms. The treatment provided significant relief.    Past Medical History  Diagnosis Date  . Hypertension   . Chronic kidney disease   . Hypercholesteremia   . Diabetes mellitus     insulin dependent  . Depression   . PONV (postoperative nausea and vomiting)    Past Surgical History  Procedure Laterality Date  . Amputation Right 05/03/2012    Procedure: Right First Ray AMPUTATION;  Surgeon: Eldred Manges, MD;  Location: [facility] OR;  Service: Orthopedics;  Laterality: Right;  . I&d extremity Right 05/29/2012    Procedure: IRRIGATION AND DEBRIDEMENT EXTREMITY- right foot;  Surgeon: Eldred Manges, MD;  Location: [facility] OR;  Service: Orthopedics;  Laterality: Right;  Right Foot Debridement, VAC Application  . Toe amputation Right 05/29/2012    1ST  ray      Dr Ophelia Charter  . Amputation Right 08/16/2012    Procedure: AMPUTATION BELOW KNEE;  Surgeon: Eldred Manges, MD;  Location: [facility] OR;  Service: Orthopedics;  Laterality: Right;  Right Below Knee Amputation   No family history on file. History  Substance Use Topics  . Smoking status: Never Smoker   . Smokeless tobacco: Never Used  . Alcohol Use: Yes     Comment: rarely    Review of Systems  Constitutional: Negative for fever.  HENT: Negative for rhinorrhea, drooling and  neck pain.   Eyes: Negative for pain.  Respiratory: Negative for cough and shortness of breath.   Cardiovascular: Negative for chest pain and leg swelling.  Gastrointestinal: Negative for nausea, vomiting, abdominal pain and diarrhea.  Genitourinary: Negative for dysuria and hematuria.  Musculoskeletal: Negative for gait problem.  Skin: Negative for color change.  Neurological: Negative for numbness and headaches.  Hematological: Negative for adenopathy.  Psychiatric/Behavioral: Negative for behavioral problems.  All other systems reviewed and are negative.    Allergies  Review of patient's allergies indicates no known allergies.  Home Medications   Current Outpatient Rx  Name  Route  Sig  Dispense  Refill  . acetaminophen (TYLENOL) 325 MG tablet   Oral   Take 325-650 mg by mouth every 4 (four) hours as needed for pain.         Marland Kitchen aspirin 81 MG tablet   Oral   Take 81 mg by mouth every morning.          . gabapentin (NEURONTIN) 100 MG capsule   Oral   Take 1 capsule (100 mg total) by mouth at bedtime.         . insulin glargine (LANTUS) 100 UNIT/ML injection   Subcutaneous   Inject 0.25 mLs (25 Units total) into the skin at bedtime.   10 mL   6   . insulin lispro (HUMALOG) 100 UNIT/ML injection   Subcutaneous   Inject 3 Units into the skin daily  as needed for high blood sugar.         . metoprolol tartrate (LOPRESSOR) 25 MG tablet   Oral   Take 1 tablet (25 mg total) by mouth 2 (two) times daily.   180 tablet   3   . oxyCODONE-acetaminophen (PERCOCET/ROXICET) 5-325 MG per tablet   Oral   Take 1-2 tablets by mouth every 4 (four) hours as needed.   240 tablet   0   . polyethylene glycol (MIRALAX / GLYCOLAX) packet   Oral   Take 17 g by mouth daily.         Marland Kitchen senna-docusate (SENOKOT-S) 8.6-50 MG per tablet   Oral   Take 2 tablets by mouth 2 (two) times daily.         . tamsulosin (FLOMAX) 0.4 MG CAPS   Oral   Take 2 capsules (0.8 mg total) by  mouth daily.   30 capsule      . bisacodyl (DULCOLAX) 10 MG suppository   Rectal   Place 1 suppository (10 mg total) rectally daily as needed.   12 suppository   0   . methocarbamol (ROBAXIN) 500 MG tablet   Oral   Take 1 tablet (500 mg total) by mouth 4 (four) times daily as needed (spasms).          BP 170/100  Pulse 78  Temp(Src) 98.1 F (36.7 C) (Oral)  Resp 18  Ht 5\' 5"  (1.651 m)  Wt 190 lb (86.183 kg)  BMI 31.62 kg/m2  SpO2 100% Physical Exam  Nursing note and vitals reviewed. Constitutional: He is oriented to person, place, and time. He appears well-developed and well-nourished.  HENT:  Head: Normocephalic and atraumatic.  Right Ear: External ear normal.  Left Ear: External ear normal.  Nose: Nose normal.  Mouth/Throat: Oropharynx is clear and moist. No oropharyngeal exudate.  Eyes: Conjunctivae and EOM are normal. Pupils are equal, round, and reactive to light.  Neck: Normal range of motion. Neck supple.  Cardiovascular: Normal rate, regular rhythm, normal heart sounds and intact distal pulses.  Exam reveals no gallop and no friction rub.   No murmur heard. Pulmonary/Chest: Effort normal and breath sounds normal. No respiratory distress. He has no wheezes.  Abdominal: Soft. Bowel sounds are normal. He exhibits no distension. There is no tenderness. There is no rebound and no guarding.  Musculoskeletal: Normal range of motion. He exhibits no edema and no tenderness.  Approx 10cm dehiscence of right lower extremity stump.   Neurological: He is alert and oriented to person, place, and time.  Skin: Skin is warm and dry.  Psychiatric: He has a normal mood and affect. His behavior is normal.    ED Course  Procedures (including critical care time) Labs Review Labs Reviewed - No data to display Imaging Review Dg Knee 2 Views Right  10/04/2012   CLINICAL DATA:  Fall, laceration to stump.  EXAM: RIGHT KNEE - 1-2 VIEW  COMPARISON:  None.  FINDINGS: No acute bony  abnormality. Specifically, no fracture, subluxation, or dislocation. Soft tissues appear intact. Prior right below-the-knee amputation.  IMPRESSION: No acute findings.   Electronically Signed   By: Charlett Nose   On: 10/04/2012 13:39    MDM   1. Dehiscence of amputation stump    12:56 PM 52 y.o. male s/p Right BKA who pw mechanical fall which occurred while at PT pta. Pt AFVSS here, refuses pain medicine. He has suffered dehiscence of his Right BKA.   Pt admitted for  revision of Right BKA.   Junius Argyle, MD 10/04/12 2007

## 2012-10-04 NOTE — Interval H&P Note (Signed)
History and Physical Interval Note:  10/04/2012 5:50 PM  Lawrence [name]  has presented today for surgery, with the diagnosis of bka dehissence  The various methods of treatment have been discussed with the patient and family. After consideration of risks, benefits and other options for treatment, the patient has consented to  Procedure(s): AMPUTATION BELOW KNEE REVISION (N/A) as a surgical intervention .  The patient's history has been reviewed, patient examined, no change in status, stable for surgery.  I have reviewed the patient's chart and labs.  Questions were answered to the patient's satisfaction.     Satoshi Kalas,Raysean C

## 2012-10-04 NOTE — Transfer of Care (Signed)
Immediate Anesthesia Transfer of Care Note  Patient: Lawrence [name]  Procedure(s) Performed: Procedure(s): AMPUTATION BELOW KNEE REVISION (Right)  Patient Location: PACU  Anesthesia Type:General  Level of Consciousness: awake, alert  and oriented  Airway & Oxygen Therapy: Patient Spontanous Breathing and Patient connected to nasal cannula oxygen  Post-op Assessment: Report given to PACU RN, Post -op Vital signs reviewed and stable and Patient moving all extremities  Post vital signs: Reviewed and stable  Complications: No apparent anesthesia complications

## 2012-10-04 NOTE — H&P (Signed)
Lawrence [name] is an 52 y.o. male.   Chief Complaint: injury to right below knee amputation HPI: Pt S/P right BKA for chronic osteomyelitis of right foot. Now 7 weeks post op.  Residing at a skilled nursing facility where he is receiving PT for rehabilitation.  Today while doing PT his left leg gave away and he fell directly onto the right BKA.  Wound opening with exposed tissue and pt sent to ED for evaluation.   Radiographs of right leg without fracture.  Wound with dehiscence and exposed muscle and fascia.  Pt admitted for revision of BKA.  Plan for overnight stay and return to NH so he will not lose his bed.  Discussed with pt and family.  Past Medical History  Diagnosis Date  . Hypertension   . Chronic kidney disease   . Hypercholesteremia   . Diabetes mellitus     insulin dependent  . Depression   . PONV (postoperative nausea and vomiting)     Past Surgical History  Procedure Laterality Date  . Amputation Right 05/03/2012    Procedure: Right First Ray AMPUTATION;  Surgeon: Eldred Manges, MD;  Location: [facility] OR;  Service: Orthopedics;  Laterality: Right;  . I&d extremity Right 05/29/2012    Procedure: IRRIGATION AND DEBRIDEMENT EXTREMITY- right foot;  Surgeon: Eldred Manges, MD;  Location: [facility] OR;  Service: Orthopedics;  Laterality: Right;  Right Foot Debridement, VAC Application  . Toe amputation Right 05/29/2012    1ST  ray      Dr Ophelia Charter  . Amputation Right 08/16/2012    Procedure: AMPUTATION BELOW KNEE;  Surgeon: Eldred Manges, MD;  Location: [facility] OR;  Service: Orthopedics;  Laterality: Right;  Right Below Knee Amputation    No family history on file. Social History:  reports that he has never smoked. He has never used smokeless tobacco. He reports that  drinks alcohol. He reports that he does not use illicit drugs.  Allergies: No Known Allergies   (Not in a hospital admission)  Results for orders placed during the hospital encounter of 10/04/12 (from the past 48 hour(s))  GLUCOSE,  CAPILLARY     Status: Abnormal   Collection Time    10/04/12  1:10 PM      Result Value Range   Glucose-Capillary 164 (*) 70 - 99 mg/dL   Dg Knee 2 Views Right  10/04/2012   CLINICAL DATA:  Fall, laceration to stump.  EXAM: RIGHT KNEE - 1-2 VIEW  COMPARISON:  None.  FINDINGS: No acute bony abnormality. Specifically, no fracture, subluxation, or dislocation. Soft tissues appear intact. Prior right below-the-knee amputation.  IMPRESSION: No acute findings.   Electronically Signed   By: Charlett Nose   On: 10/04/2012 13:39    Review of Systems  Skin:       Open BKA incision right with exposed tissue.  All other systems reviewed and are negative.    Blood pressure 163/74, pulse 77, temperature 98.1 F (36.7 C), temperature source Oral, resp. rate 18, height 5\' 5"  (1.651 m), weight 86.183 kg (190 lb), SpO2 100.00%. Physical Exam  Constitutional: He is oriented to person, place, and time. He appears well-developed and well-nourished.  HENT:  Head: Normocephalic and atraumatic.  Eyes: EOM are normal. Pupils are equal, round, and reactive to light.  Neck: Normal range of motion.  Cardiovascular: Normal rate.   Respiratory: Effort normal.  GI: Soft.  Musculoskeletal:  Right BKA incision with near complete opening.  Still together at edges.  Exposed muscle and fascia.  Bone not exposed.  No purulence.  Min bleeding with some clot over tissues.  Neurological: He is alert and oriented to person, place, and time.  Skin: Skin is warm and dry.  Psychiatric: He has a normal mood and affect.     Assessment/Plan Traumatic wound dehiscence of right below knee amputation incision line.  PLAN:  Revision of right BKA.  Return to SNF tomorrow if pt stable to prevent loss of bed.  Gaile Allmon M 10/04/2012, 3:45 PM

## 2012-10-04 NOTE — Preoperative (Signed)
Beta Blockers   Reason not to administer Beta Blockers:Not Applicable 

## 2012-10-05 LAB — GLUCOSE, CAPILLARY
Glucose-Capillary: 326 mg/dL — ABNORMAL HIGH (ref 70–99)
Glucose-Capillary: 85 mg/dL (ref 70–99)

## 2012-10-05 MED ORDER — CEPHALEXIN 500 MG PO CAPS: 500.0000 mg | ORAL_CAPSULE | Freq: Three times a day (TID) | ORAL | Status: DC

## 2012-10-05 NOTE — Progress Notes (Signed)
Ambulance had arrived to picked patient up. Vitals taken for ambulance drivers. Patient transported out of hospital via stretcher and ambulance drivers. Will be taken to [facility]

## 2012-10-05 NOTE — Progress Notes (Signed)
Subjective: 1 Day Post-Op Procedure(s) (LRB): AMPUTATION BELOW KNEE REVISION (Right) Patient reports pain as mild.    Objective: Vital signs in last 24 hours: Temp:  [97.6 F (36.4 C)-99.4 F (37.4 C)] 99.4 F (37.4 C) (08/28 4098) Pulse Rate:  [67-83] 72 (08/28 0608) Resp:  [9-18] 16 (08/28 0608) BP: (130-183)/(57-100) 146/57 mmHg (08/28 0608) SpO2:  [99 %-100 %] 100 % (08/28 0608) Weight:  [86.183 kg (190 lb)] 86.183 kg (190 lb) (08/27 1210)  Intake/Output from previous day: 08/27 0701 - 08/28 0700 In: 1920 [P.O.:720; I.V.:1200] Out: 1000 [Urine:1000] Intake/Output this shift:     Recent Labs  10/04/12 1629  HGB 10.7*    Recent Labs  10/04/12 1629  WBC 6.2  RBC 3.85*  HCT 30.6*  PLT 148*    Recent Labs  10/04/12 1629  NA 137  K 4.3  CL 105  CO2 23  BUN 35*  CREATININE 1.06  GLUCOSE 207*  CALCIUM 9.5   No results found for this basename: LABPT, INR,  in the last 72 hours  dressing dry  Assessment/Plan: 1 Day Post-Op Procedure(s) (LRB): AMPUTATION BELOW KNEE REVISION (Right) Discharge to SNF  Office F/U 3 wks  Janyiah Silveri,Icarus C 10/05/2012, 8:38 AM

## 2012-10-05 NOTE — Clinical Social Work Psychosocial (Signed)
Clinical Social Work Department BRIEF PSYCHOSOCIAL ASSESSMENT 10/05/2012  Patient:  ISMAR, YABUT     Account Number:  [id number]     Admit date:  10/04/2012  Clinical Social Worker: Rondel Baton, Theresia Majors  Date/Time:   10/05/2012  Referred by: Physician   Date Referred:  10/05/2012  Other Referral:  none Interview type:  patient Other interview type: none   PSYCHOSOCIAL DATA Living Status:  Facility Admitted from facility:  [facility] Level of care:  Skilled Primary support name:  unknown Primary support relationship to patient:  n/a Degree of support available:  Fair to poor  CURRENT CONCERNS  Other Concerns:  none  SOCIAL WORK ASSESSMENT / PLAN  Assessment/plan status:   CSW met with pt at bedside.  CSW introduced self and CSW role.  Pt was pleasant, alert and oriented x4.  Pt states that he is from [facility] and has been there for a few weeks.  Pt states that he fell on his BKA while at SNF. Pt is agreeable to returning to [facility].  CSW contacted Marylene Land from SNF who is agreeable for pt to return.  Pt was originally sent to SNF with Letter of Guarantee (LOG).  Per Marylene Land at [facility], this does not need to be updated.  The original LOG is still active.  CSW will facilitate d/c.    Other assessment/ plan:  None   Information/referral to community resources:  SNF  PATIENT'S/FAMILY'S RESPONSE TO PLAN OF CARE:  Pt was agreeable to return to SNF.   Vickii Penna, LCSWA [phone]  Clinical Social Work

## 2012-10-05 NOTE — Discharge Summary (Signed)
Physician Discharge Summary  Patient ID: Lawrence [name] MRN: [id number] DOB/AGE: 52-Apr-1962 52 y.o.  Admit date: 10/04/2012 Discharge date: 10/05/2012  Admission Diagnoses:  Discharge Diagnoses:  Active Problems:   Surgical wound dehiscence   Dehiscence of amputation stump   Discharged Condition: good  Hospital Course: patient taken to surgery and had reclosure of Sub Q and skin .   No necrosis noted.    Consults: None  Significant Diagnostic Studies:   Treatments: surgery: as above  Discharge Exam: Blood pressure 146/57, pulse 72, temperature 99.4 F (37.4 C), temperature source Oral, resp. rate 16, height 5\' 5"  (1.651 m), weight 86.183 kg (190 lb), SpO2 100.00%.   Disposition: 03-Skilled Nursing Facility     Medication List         acetaminophen 325 MG tablet  Commonly known as:  TYLENOL  Take 325-650 mg by mouth every 4 (four) hours as needed for pain.     aspirin 81 MG tablet  Take 81 mg by mouth every morning.     bisacodyl 10 MG suppository  Commonly known as:  DULCOLAX  Place 1 suppository (10 mg total) rectally daily as needed.     gabapentin 100 MG capsule  Commonly known as:  NEURONTIN  Take 1 capsule (100 mg total) by mouth at bedtime.     HUMALOG 100 UNIT/ML injection  Generic drug:  insulin lispro  Inject 3 Units into the skin daily as needed for high blood sugar.     insulin glargine 100 UNIT/ML injection  Commonly known as:  LANTUS  Inject 0.25 mLs (25 Units total) into the skin at bedtime.     methocarbamol 500 MG tablet  Commonly known as:  ROBAXIN  Take 1 tablet (500 mg total) by mouth 4 (four) times daily as needed (spasms).     metoprolol tartrate 25 MG tablet  Commonly known as:  LOPRESSOR  Take 1 tablet (25 mg total) by mouth 2 (two) times daily.     oxyCODONE-acetaminophen 5-325 MG per tablet  Commonly known as:  PERCOCET/ROXICET  Take 1-2 tablets by mouth every 4 (four) hours as needed.     polyethylene glycol packet   Commonly known as:  MIRALAX / GLYCOLAX  Take 17 g by mouth daily.     senna-docusate 8.6-50 MG per tablet  Commonly known as:  Senokot-S  Take 2 tablets by mouth 2 (two) times daily.     tamsulosin 0.4 MG Caps capsule  Commonly known as:  FLOMAX  Take 2 capsules (0.8 mg total) by mouth daily.         SignedEldred Manges 10/05/2012, 8:40 AM

## 2012-10-05 NOTE — Progress Notes (Signed)
Report called to Clydie Braun at [facility]. Patient expected to arrive at SNF in approximately one hour. Clydie Braun made aware.

## 2012-10-05 NOTE — Clinical Social Work Note (Signed)
CSW met with Lawrence [name] at bedside.  Full assessment to follow.  Lawrence [name] from [place], [place].  Vickii Penna, LCSWA [phone]  Clinical Social Work

## 2012-10-05 NOTE — Evaluation (Signed)
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 He continued to have poor healing despite VAC treatment for over 2 months. Underwent R BKA  and had fall at SNF with wound dehiscence.  Now revision of R BKA.    Clinical Impression  Pt demos safe technique and good awareness of safety, however a little nervous with amb since falling.  Anticipate return to SNF today.  Acute PT will sign off.      PT Assessment  All further PT needs can be met in the next venue of care    Follow Up Recommendations  SNF    Does the patient have the potential to tolerate intense rehabilitation      Barriers to Discharge        Equipment Recommendations  None recommended by PT    Recommendations for Other Services     Frequency      Precautions / Restrictions Precautions Precautions: Fall Restrictions Weight Bearing Restrictions: Yes RLE Weight Bearing: Non weight bearing   Pertinent Vitals/Pain Indicates R LE sensitive, not painful.        Mobility  Bed Mobility Bed Mobility: Not assessed Transfers Transfers: Sit to Stand;Stand to Sit Sit to Stand: 4: Min assist;With upper extremity assist;From chair/3-in-1 Stand to Sit: 4: Min assist;With upper extremity assist;To chair/3-in-1 Details for Transfer Assistance: demos good technique, A for balance.   Ambulation/Gait Ambulation/Gait Assistance: 4: Min assist Ambulation Distance (Feet): 8 Feet Assistive device: Rolling walker Ambulation/Gait Assistance Details: pt demos good use of RW, unsteady and a little nervous 2/2 falling  recently which caused his dehiscence.   Gait Pattern: Step-to pattern Stairs: No Wheelchair Mobility Wheelchair Mobility: No    Exercises     PT Diagnosis: Difficulty walking;Generalized weakness  PT Problem List: Decreased strength;Decreased activity tolerance;Decreased balance;Decreased mobility;Decreased knowledge of use of DME PT Treatment Interventions:       PT Goals(Current goals can be found in the care plan section) Acute Rehab PT Goals Patient Stated Goal: Get back to Lawrence [name] today.    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 He continued to have poor healing despite VAC treatment for over 2 months. Underwent R BKA  and had fall at SNF with wound dehiscence.  Now revision of R BKA.         Prior Functioning  Home Living Family/patient expects to be discharged to:: Skilled nursing facility Prior Function Level of Independence: Needs assistance Gait / Transfers Assistance Needed: With therapy working on amb short distances.   Communication Communication: No difficulties Dominant Hand: Left    Cognition  Cognition Arousal/Alertness: Awake/alert Behavior During Therapy: WFL for tasks assessed/performed Overall Cognitive Status: Within Functional Limits for tasks assessed    Extremity/Trunk Assessment Upper Extremity Assessment Upper Extremity Assessment: Generalized weakness Lower Extremity Assessment Lower Extremity Assessment: Generalized weakness   Balance Balance Balance  Assessed: No  End of Session PT - End of Session Equipment Utilized During Treatment: Gait belt Activity Tolerance: Patient tolerated treatment well Patient left: in chair;with call bell/phone within  reach Nurse Communication: Mobility status  GP Functional Assessment Tool Used: Clinical Judgement Functional Limitation: Mobility: Walking and moving around Mobility: Walking and Moving Around Current Status (Z6109): At least 20 percent but less than 40 percent impaired, limited or restricted Mobility: Walking and Moving Around Discharge Status [phone]): At least 20 percent but less than 40 percent impaired, limited or restricted   Lawrence [name], [place] [phone] 10/05/2012, 8:06 AM

## 2012-10-06 ENCOUNTER — Encounter (HOSPITAL_COMMUNITY): Payer: Self-pay | Admitting: Orthopaedic Surgery

## 2012-11-17 ENCOUNTER — Inpatient Hospital Stay (HOSPITAL_COMMUNITY)
Admission: EM | Admit: 2012-11-17 | Discharge: 2012-11-18 | DRG: 565 | Disposition: A | Discharge status: Home / Self Care | Payer: Medicaid Other | Attending: Family Medicine | Admitting: Family Medicine

## 2012-11-17 ENCOUNTER — Encounter (HOSPITAL_COMMUNITY): Payer: Self-pay | Admitting: Emergency Medicine

## 2012-11-17 DIAGNOSIS — I129 Hypertensive chronic kidney disease with stage 1 through stage 4 chronic kidney disease, or unspecified chronic kidney disease: Secondary | ICD-10-CM | POA: Diagnosis present

## 2012-11-17 DIAGNOSIS — S88111S Complete traumatic amputation at level between knee and ankle, right lower leg, sequela: Secondary | ICD-10-CM

## 2012-11-17 DIAGNOSIS — E7800 Pure hypercholesterolemia, unspecified: Secondary | ICD-10-CM | POA: Diagnosis present

## 2012-11-17 DIAGNOSIS — IMO0001 Reserved for inherently not codable concepts without codable children: Secondary | ICD-10-CM | POA: Diagnosis present

## 2012-11-17 DIAGNOSIS — N184 Chronic kidney disease, stage 4 (severe): Secondary | ICD-10-CM | POA: Diagnosis present

## 2012-11-17 DIAGNOSIS — T8781 Dehiscence of amputation stump: Secondary | ICD-10-CM | POA: Diagnosis present

## 2012-11-17 DIAGNOSIS — Z7982 Long term (current) use of aspirin: Secondary | ICD-10-CM

## 2012-11-17 DIAGNOSIS — T8789 Other complications of amputation stump: Principal | ICD-10-CM | POA: Diagnosis present

## 2012-11-17 DIAGNOSIS — T8131XA Disruption of external operation (surgical) wound, not elsewhere classified, initial encounter: Secondary | ICD-10-CM

## 2012-11-17 DIAGNOSIS — Z79899 Other long term (current) drug therapy: Secondary | ICD-10-CM

## 2012-11-17 DIAGNOSIS — F3289 Other specified depressive episodes: Secondary | ICD-10-CM | POA: Diagnosis present

## 2012-11-17 DIAGNOSIS — E119 Type 2 diabetes mellitus without complications: Secondary | ICD-10-CM | POA: Diagnosis present

## 2012-11-17 DIAGNOSIS — Y92009 Unspecified place in unspecified non-institutional (private) residence as the place of occurrence of the external cause: Secondary | ICD-10-CM

## 2012-11-17 DIAGNOSIS — Y835 Amputation of limb(s) as the cause of abnormal reaction of the patient, or of later complication, without mention of misadventure at the time of the procedure: Secondary | ICD-10-CM | POA: Diagnosis present

## 2012-11-17 DIAGNOSIS — S88119A Complete traumatic amputation at level between knee and ankle, unspecified lower leg, initial encounter: Secondary | ICD-10-CM

## 2012-11-17 DIAGNOSIS — Z794 Long term (current) use of insulin: Secondary | ICD-10-CM

## 2012-11-17 DIAGNOSIS — F329 Major depressive disorder, single episode, unspecified: Secondary | ICD-10-CM | POA: Diagnosis present

## 2012-11-17 DIAGNOSIS — N181 Chronic kidney disease, stage 1: Secondary | ICD-10-CM | POA: Diagnosis present

## 2012-11-17 LAB — BASIC METABOLIC PANEL
BUN: 38 mg/dL — ABNORMAL HIGH (ref 6–23)
Chloride: 106 mEq/L (ref 96–112)
GFR calc non Af Amer: 64 mL/min — ABNORMAL LOW (ref >90)
Glucose, Bld: 81 mg/dL (ref 70–99)
Potassium: 4.3 mEq/L (ref 3.5–5.1)
Sodium: 140 mEq/L (ref 135–145)

## 2012-11-17 LAB — CBC WITH DIFFERENTIAL/PLATELET
Hemoglobin: 9.7 g/dL — ABNORMAL LOW (ref 13.0–17.0)
Lymphocytes Relative: 23 % (ref 12–46)
Lymphs Abs: 1.4 10*3/uL (ref 0.7–4.0)
Monocytes Relative: 9 % (ref 3–12)
Neutro Abs: 4 10*3/uL (ref 1.7–7.7)
Neutrophils Relative %: 66 % (ref 43–77)
Platelets: 138 10*3/uL — ABNORMAL LOW (ref 150–400)
RBC: 3.4 MIL/uL — ABNORMAL LOW (ref 4.22–5.81)
WBC: 6.1 10*3/uL (ref 4.0–10.5)

## 2012-11-17 MED ORDER — SODIUM CHLORIDE 0.9 % IV SOLN: INTRAVENOUS | Status: DC

## 2012-11-17 NOTE — H&P (Signed)
Family Medicine Teaching [facility] Admission History and Physical Service Pager: [phone]  Patient name: Lawrence [name] Medical record number: [id number] Date of birth: [birth year] Age: 52 y.o. Gender: male  Primary Care Provider: [name], MD Consultants: Orthopaedics (Dr. Ophelia Charter) Code Status: Full   Chief Complaint: RLE dehiscence   Assessment and Plan: Mr. Tufo is a 52 y/o s/p R BKA with wound dehisicence.  His PMHx is significant for CKD and DM II uncontrolled   # RLE Wound Dehiscence - Pt with previous history of this on 8/27 requiring reclosure at that time - Consult to orthopaedics, Dr. Ophelia Charter, greatly appreciate recommendations - Pre- op labs in AM including CBC, PT-INR, aPTT, and EKG - Wet and dry dressing to area for now, consult to wound for further management acutely  - No evidence of infection or foreign bodies on gross exam.  Will get X-ray of area to evaluate for foreign bodies or acute bony injury.   - NPO after midnight for possible surgery.  Will hold heparin pending closure of wound as well as possible OR, defer to orthopaedics for anticoagulation  - Holding ASA pending surgery evaluation as well.  Restart pending decision on surgical vs non surgical management   # CKD Stage II  -Creatinine stable at 1.27. Hydration at 75 cc/hr - Repeat BMET in AM  #DM II uncontrolled  - Last A1C 8.6 - SSI while inpatient.   - Holding Lantus for now as pt received night time dose already.  Will need to restart pending ortho decision on surgery    FEN/GI: NPO pending possible, NS @ 75 cc/hr  Prophylaxis: Pending surgery    Disposition: Pending further orthopaedic evaluation   History of Present Illness: ADEN SEK is a 52 y.o. male presenting with RLE wound dehiscence.  Pt originally had BKA performed in beginning of July secondary to gangrene.  He went to SNF for rehabilitation after that and had a fall while there, reopening the area on 8/27, requiring revision at  that time.  Pt was d/c from the SNF today and was getting ready to get into bed when he grabbed onto his walker, which was wrapped around his phone cord, and ended up tripping, falling onto his R BKA onto the floor (which was carpeted).  He denies any loss of sensation, warmness in the area, fever, chills, sweats, LOC, pre-syncope, chest pain, or palpitations.   In the ED, pt had a CBC showing nml Hgb for him (9.7), stable BMP and stable vitals.    Review Of Systems: Per HPI with the following additions: None  Otherwise 12 point review of systems was performed and was unremarkable.  Patient Active Problem List   Diagnosis Date Noted  . Surgical wound dehiscence 10/04/2012  . Dehiscence of amputation stump 10/04/2012  . Unilateral complete BKA--right 08/22/2012  . Urinary retention 08/19/2012  . Osteomyelitis of right foot 08/17/2012    Class: Diagnosis of  . ARF (acute renal failure) 06/03/2012  . Cellulitis 06/03/2012  . Post-operative wound abscess 05/29/2012  . Gangrene of toe 05/02/2012  . Chronic cough 02/22/2012  . Hematuria 03/23/2011  . Prostate asymmetry 03/23/2011  . Colon cancer screening 02/19/2011  . Neuropathy 08/25/2010  . Chronic kidney disease (CKD), stage 4 03/14/2009  . Accelerated hypertension 06/18/2008  . DM (diabetes mellitus), type 2, uncontrolled 12/26/2007  . HYPERCHOLESTEROLEMIA 12/26/2007   Past Medical History: Past Medical History  Diagnosis Date  . Hypertension   . Chronic kidney disease   .  Hypercholesteremia   . Diabetes mellitus     insulin dependent  . Depression   . PONV (postoperative nausea and vomiting)    Past Surgical History: Past Surgical History  Procedure Laterality Date  . Amputation Right 05/03/2012    Procedure: Right First Ray AMPUTATION;  Surgeon: Eldred Manges, MD;  Location: [facility] OR;  Service: Orthopedics;  Laterality: Right;  . I&d extremity Right 05/29/2012    Procedure: IRRIGATION AND DEBRIDEMENT EXTREMITY- right foot;   Surgeon: Eldred Manges, MD;  Location: [facility] OR;  Service: Orthopedics;  Laterality: Right;  Right Foot Debridement, VAC Application  . Toe amputation Right 05/29/2012    1ST  ray      Dr Ophelia Charter  . Amputation Right 08/16/2012    Procedure: AMPUTATION BELOW KNEE;  Surgeon: Eldred Manges, MD;  Location: [facility] OR;  Service: Orthopedics;  Laterality: Right;  Right Below Knee Amputation  . Amputation Right 10/04/2012    Procedure: AMPUTATION BELOW KNEE REVISION;  Surgeon: Eldred Manges, MD;  Location: [facility] OR;  Service: Orthopedics;  Laterality: Right;   Social History: History  Substance Use Topics  . Smoking status: Never Smoker   . Smokeless tobacco: Never Used  . Alcohol Use: Yes     Comment: rarely   Additional social history: Non contributory   Please also refer to relevant sections of EMR.  Family History: No family history on file. Allergies and Medications: No Known Allergies No current facility-administered medications on file prior to encounter.   Current Outpatient Prescriptions on File Prior to Encounter  Medication Sig Dispense Refill  . acetaminophen (TYLENOL) 325 MG tablet Take 325-650 mg by mouth every 4 (four) hours as needed for pain.      Marland Kitchen aspirin 81 MG tablet Take 81 mg by mouth every morning.       . bisacodyl (DULCOLAX) 10 MG suppository Place 1 suppository (10 mg total) rectally daily as needed.  12 suppository  0  . insulin glargine (LANTUS) 100 UNIT/ML injection Inject 0.25 mLs (25 Units total) into the skin at bedtime.  10 mL  6  . insulin lispro (HUMALOG) 100 UNIT/ML injection Inject 3 Units into the skin daily as needed for high blood sugar.      . metoprolol tartrate (LOPRESSOR) 25 MG tablet Take 1 tablet (25 mg total) by mouth 2 (two) times daily.  180 tablet  3  . oxyCODONE-acetaminophen (PERCOCET/ROXICET) 5-325 MG per tablet Take 1-2 tablets by mouth every 4 (four) hours as needed.  240 tablet  0  . polyethylene glycol (MIRALAX / GLYCOLAX) packet Take 17 g by mouth  daily.        Objective: BP 202/93  Pulse 80  Temp(Src) 98.4 F (36.9 C) (Oral)  Resp 18  SpO2 100% Exam: General: NAD, pleasant 52 y/o M HEENT: [place]/AT, MMM Cardiovascular: RRR, no murmurs appreciated Respiratory: CTA B/L Abdomen: Soft NT/ND Extremities: RLE with open wound dehiscence at BKA stump, not actively bleeding, no evidence of surrounding infection Skin: No rashes Neuro: No focal deficits   Labs and Imaging: CBC BMET   Recent Labs Lab 11/17/12 2214  WBC 6.1  HGB 9.7*  HCT 27.7*  PLT 138*   No results found for this basename: NA, K, CL, CO2, BUN, CREATININE, GLUCOSE, CALCIUM,  in the last 168 hours    Briscoe Deutscher, DO 11/17/2012, 11:29 PM PGY-2, [facility] FPTS Intern pager: [phone], text pages welcome

## 2012-11-17 NOTE — ED Notes (Signed)
Ed res at the beside.  No active bleeding at present

## 2012-11-17 NOTE — ED Notes (Signed)
The pt arrived by gems from home.  He just returned home from [facility] after being  Housed there after his rt bk surgery approx  2 weeks ago.  He fell while attempting to get up and struck his stump on another chair.  accodring to ems the entire covering over the stump [place] missing and he has profuse bleeding.    Pressure bandaged placed by gems.  Will keep until the doctor sees the pt

## 2012-11-17 NOTE — ED Notes (Signed)
Pt very unhappy with the news that he need to be admitted.  Wound left open to air by ed res.  No active bleeding. The pt is tired fo being here

## 2012-11-18 ENCOUNTER — Non-Acute Institutional Stay (SKILLED_NURSING_FACILITY): Payer: Medicaid Other | Admitting: Internal Medicine

## 2012-11-18 DIAGNOSIS — E1129 Type 2 diabetes mellitus with other diabetic kidney complication: Secondary | ICD-10-CM

## 2012-11-18 LAB — CBC WITH DIFFERENTIAL/PLATELET
Basophils Absolute: 0 10*3/uL (ref 0.0–0.1)
Eosinophils Relative: 3 % (ref 0–5)
HCT: 27.1 % — ABNORMAL LOW (ref 39.0–52.0)
Lymphocytes Relative: 30 % (ref 12–46)
MCV: 80.4 fL (ref 78.0–100.0)
Monocytes Relative: 11 % (ref 3–12)
Neutro Abs: 2.6 10*3/uL (ref 1.7–7.7)
Neutrophils Relative %: 56 % (ref 43–77)
Platelets: 128 10*3/uL — ABNORMAL LOW (ref 150–400)
RDW: 14.6 % (ref 11.5–15.5)
WBC: 4.6 10*3/uL (ref 4.0–10.5)

## 2012-11-18 LAB — BASIC METABOLIC PANEL
CO2: 25 mEq/L (ref 19–32)
Chloride: 110 mEq/L (ref 96–112)
Creatinine, Ser: 1.23 mg/dL (ref 0.50–1.35)
GFR calc Af Amer: 77 mL/min — ABNORMAL LOW (ref >90)
Potassium: 3.9 mEq/L (ref 3.5–5.1)
Sodium: 144 mEq/L (ref 135–145)

## 2012-11-18 LAB — PROTIME-INR
INR: 1.11 (ref 0.00–1.49)
Prothrombin Time: 14.1 seconds (ref 11.6–15.2)

## 2012-11-18 LAB — APTT: aPTT: 27 seconds (ref 24–37)

## 2012-11-18 LAB — GLUCOSE, CAPILLARY
Glucose-Capillary: 83 mg/dL (ref 70–99)
Glucose-Capillary: 70 mg/dL (ref 70–99)
Glucose-Capillary: 71 mg/dL (ref 70–99)
Glucose-Capillary: 112 mg/dL — ABNORMAL HIGH (ref 70–99)

## 2012-11-18 MED ORDER — HEPARIN SODIUM (PORCINE) 5000 UNIT/ML IJ SOLN
5000.0000 [IU] | Freq: Three times a day (TID) | INTRAMUSCULAR | Status: DC
  Filled 2012-11-18: qty 1

## 2012-11-18 MED ORDER — OXYCODONE-ACETAMINOPHEN 5-325 MG PO TABS
1.0000 | ORAL_TABLET | ORAL | Status: DC | PRN
  Administered 2012-11-18: 1 via ORAL
  Filled 2012-11-18: qty 1

## 2012-11-18 MED ORDER — LIDOCAINE HCL (PF) 1 % IJ SOLN
5.0000 mL | Freq: Once | INTRAMUSCULAR | Status: AC
  Administered 2012-11-18: 5 mL via INTRADERMAL
  Filled 2012-11-18 (×2): qty 5

## 2012-11-18 MED ORDER — LIDOCAINE HCL (PF) 1 % IJ SOLN: 5.0000 mL | Freq: Once | INTRAMUSCULAR | Status: DC

## 2012-11-18 MED ORDER — POLYETHYLENE GLYCOL 3350 17 G PO PACK
17.0000 g | PACK | Freq: Every day | ORAL | Status: DC
  Filled 2012-11-18: qty 1

## 2012-11-18 MED ORDER — INSULIN ASPART 100 UNIT/ML ~~LOC~~ SOLN: 0.0000 [IU] | Freq: Three times a day (TID) | SUBCUTANEOUS | Status: DC

## 2012-11-18 MED ORDER — INFLUENZA VAC SPLIT QUAD 0.5 ML IM SUSP: 0.5000 mL | INTRAMUSCULAR | Status: DC

## 2012-11-18 MED ORDER — METOPROLOL TARTRATE 25 MG PO TABS
25.0000 mg | ORAL_TABLET | Freq: Two times a day (BID) | ORAL | Status: DC
  Administered 2012-11-18 (×2): 25 mg via ORAL
  Filled 2012-11-18 (×3): qty 1

## 2012-11-18 MED ORDER — MORPHINE SULFATE 2 MG/ML IJ SOLN: 2.0000 mg | INTRAMUSCULAR | Status: DC | PRN

## 2012-11-18 MED ORDER — LIDOCAINE HCL (PF) 1 % IJ SOLN
INTRAMUSCULAR | Status: AC
  Filled 2012-11-18: qty 5

## 2012-11-18 NOTE — Discharge Summary (Signed)
FMTS Attending Note  I personally saw and evaluated the patient. The plan of care was discussed with the resident team. I agree with the assessment and plan as documented by the resident.   Stable for discharge.   Donnella Sham MD

## 2012-11-18 NOTE — Progress Notes (Signed)
Pt arrived to 5N02 at 0140. Pt slid himself over to bed with no difficulty. Foley cath intact, leg bag in place and emptied. C/o zero pain initially. Dressing removed to assess wound. Open incision, moderate sanguinous drainage. Pt states he wanted to go home and thinks he does not need surgery again. Spoke with pt at length about inserting an IV, due to possible surgery and currently being NPO. Pt stated he did not want an IV at this time, expecting ortho MD to round in am and will agree to an IV once he knows he will definitely need surgery. Pt also stated he does not like morphine as it is "too strong for him", prefers to take percocet as he took PTA. Order received for Percocet. Pt currently resting comfortably in bed. Will continue to monitor.

## 2012-11-18 NOTE — ED Provider Notes (Signed)
CSN: [id number]     Arrival date & time 11/17/12  1937 History   First MD Initiated Contact with Patient 11/17/12 1948     Chief Complaint  Patient presents with  . bleeding from stump    (Consider location/radiation/quality/duration/timing/severity/associated sxs/prior Treatment) HPI Comments: 52 Y/o male with history of right BKA and recent admission for wound dehiscence presenting with injury to stump. He returned home from rehab facility yesterday and fell while ambulating with walker. He did not fall to the ground. Stump came down on a chair and bust open. Pressure bandage applied by EMS.   The history is provided by the patient. No language interpreter was used.    Past Medical History  Diagnosis Date  . Hypertension   . Chronic kidney disease   . Hypercholesteremia   . Diabetes mellitus     insulin dependent  . Depression   . PONV (postoperative nausea and vomiting)    Past Surgical History  Procedure Laterality Date  . Amputation Right 05/03/2012    Procedure: Right First Ray AMPUTATION;  Surgeon: Eldred Manges, MD;  Location: [facility] OR;  Service: Orthopedics;  Laterality: Right;  . I&d extremity Right 05/29/2012    Procedure: IRRIGATION AND DEBRIDEMENT EXTREMITY- right foot;  Surgeon: Eldred Manges, MD;  Location: [facility] OR;  Service: Orthopedics;  Laterality: Right;  Right Foot Debridement, VAC Application  . Toe amputation Right 05/29/2012    1ST  ray      Dr Ophelia Charter  . Amputation Right 08/16/2012    Procedure: AMPUTATION BELOW KNEE;  Surgeon: Eldred Manges, MD;  Location: [facility] OR;  Service: Orthopedics;  Laterality: Right;  Right Below Knee Amputation  . Amputation Right 10/04/2012    Procedure: AMPUTATION BELOW KNEE REVISION;  Surgeon: Eldred Manges, MD;  Location: [facility] OR;  Service: Orthopedics;  Laterality: Right;   No family history on file. History  Substance Use Topics  . Smoking status: Never Smoker   . Smokeless tobacco: Never Used  . Alcohol Use: Yes     Comment: rarely     Review of Systems  Constitutional: Negative for fever, diaphoresis and fatigue.  Respiratory: Negative for chest tightness and shortness of breath.   Cardiovascular: Negative for chest pain.  Skin: Positive for wound.  Neurological: Negative for dizziness and syncope.  All other systems reviewed and are negative.    Allergies  Review of patient's allergies indicates no known allergies.  Home Medications   Current Outpatient Rx  Name  Route  Sig  Dispense  Refill  . acetaminophen (TYLENOL) 325 MG tablet   Oral   Take 325-650 mg by mouth every 4 (four) hours as needed for pain.         Marland Kitchen aspirin 81 MG tablet   Oral   Take 81 mg by mouth every morning.          . bisacodyl (DULCOLAX) 10 MG suppository   Rectal   Place 1 suppository (10 mg total) rectally daily as needed.   12 suppository   0   . insulin glargine (LANTUS) 100 UNIT/ML injection   Subcutaneous   Inject 0.25 mLs (25 Units total) into the skin at bedtime.   10 mL   6   . insulin lispro (HUMALOG) 100 UNIT/ML injection   Subcutaneous   Inject 3 Units into the skin daily as needed for high blood sugar.         . metoprolol tartrate (LOPRESSOR) 25 MG tablet   Oral  Take 1 tablet (25 mg total) by mouth 2 (two) times daily.   180 tablet   3   . oxyCODONE-acetaminophen (PERCOCET/ROXICET) 5-325 MG per tablet   Oral   Take 1-2 tablets by mouth every 4 (four) hours as needed.   240 tablet   0   . polyethylene glycol (MIRALAX / GLYCOLAX) packet   Oral   Take 17 g by mouth daily.          BP 155/63  Pulse 74  Temp(Src) 98.7 F (37.1 C) (Oral)  Resp 18  SpO2 99% Physical Exam  Vitals reviewed. Constitutional: He is oriented to person, place, and time. He appears well-developed and well-nourished. No distress.  HENT:  Head: Normocephalic and atraumatic.  Eyes: Conjunctivae are normal.  Cardiovascular: Normal rate, regular rhythm, normal heart sounds and intact distal pulses.    Pulmonary/Chest: Effort normal and breath sounds normal.  Abdominal: Soft. Bowel sounds are normal. He exhibits no distension. There is no tenderness.  Musculoskeletal:  Right BKA with open wound. Muscle exposed, no bone visualized. Wound hemostatic with large clots.  Neurological: He is alert and oriented to person, place, and time.  Skin: Skin is warm.    ED Course  Procedures (including critical care time) Labs Review Labs Reviewed  CBC WITH DIFFERENTIAL - Abnormal; Notable for the following:    RBC 3.40 (*)    Hemoglobin 9.7 (*)    HCT 27.7 (*)    Platelets 138 (*)    All other components within normal limits  BASIC METABOLIC PANEL - Abnormal; Notable for the following:    BUN 38 (*)    GFR calc non Af Amer 64 (*)    GFR calc Af Amer 74 (*)    All other components within normal limits  BASIC METABOLIC PANEL - Abnormal; Notable for the following:    BUN 36 (*)    GFR calc non Af Amer 66 (*)    GFR calc Af Amer 77 (*)    All other components within normal limits  CBC WITH DIFFERENTIAL - Abnormal; Notable for the following:    RBC 3.37 (*)    Hemoglobin 9.3 (*)    HCT 27.1 (*)    Platelets 128 (*)    All other components within normal limits  GLUCOSE, CAPILLARY - Abnormal; Notable for the following:    Glucose-Capillary 112 (*)    All other components within normal limits  APTT  PROTIME-INR  GLUCOSE, CAPILLARY  GLUCOSE, CAPILLARY  GLUCOSE, CAPILLARY   Imaging Review No results found.  EKG Interpretation     Ventricular Rate:    PR Interval:    QRS Duration:   QT Interval:    QTC Calculation:   R Axis:     Text Interpretation:              MDM   1. Wound dehiscence, initial encounter   2. Chronic kidney disease (CKD), stage I   3. Unilateral complete BKA, right, sequela    52 Y/o male with right BKA 2/2 osteomyelitis presenting with traumatic wound dehiscence. Similar presentation in August requiring surgical repair. Patient fell at home onto  stump causing it to split open. No other injury reported. Wound hemostatic on my exam. Large skin defect with exposed muscle. Tetanus UTD. Ortho consulted for repair with recommendations for wet to dry dressing and will evaluate in am for closure. Admit to [facility].   Labs and imaging reviewed in my medical decision making if ordered.  Patient discussed with my attending, Dr. Silverio Lay.     Abagail Kitchens, MD 11/18/12 [phone]

## 2012-11-18 NOTE — Progress Notes (Signed)
CBG this AM 70, pt is NPO for possible surgery today. Pt again refused to have IV put in. Stated he "feels ok and will be fine". MD on call notified and informed of situation. Since pt currently asymptomatic, no new orders, only to recheck CBG in one hour and notify MD of any changes. Notified day shift RN of event and plan.

## 2012-11-18 NOTE — Progress Notes (Signed)
Patient stated he has been on Flomax 0.4mg  PO BID for months in addition to chronic foley catheter.  Medication not in PTA med list, MD called.  No new orders other than to continue with medication and address issue at follow up appt.  Nsg to continue to monitor for status changes.

## 2012-11-18 NOTE — ED Provider Notes (Addendum)
I have supervised the resident on the management of this patient and agree with the note above. I personally interviewed and examined the patient and my addendum is below.   Lawrence [name] is a 52 y.o. 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 Fell and hit the R leg then had bleeding afterwards. Has some wound dehiscence. No active bleeding now. Ortho consulted and will see in AM. Hg stable. Will admit for observation overnight.    I have personally reviewed the EKG and agreed with the resident's interpretation    Richardean Canal, MD 11/18/12 2013  Richardean Canal, MD 12/20/12 2056

## 2012-11-18 NOTE — Progress Notes (Signed)
Clinical Child psychotherapist (CSW) arranged PTAR (non-emergency) transportation for patient to go home to his private residence. CSW confirmed the address with the patient [address] [place]. Nursing is aware of above. Please reconsult if further social work needs arise. CSW signing off.   Jetta Lout, LCSWA Weekend CSW [phone]

## 2012-11-18 NOTE — Discharge Summary (Signed)
Physician Discharge Summary  Patient ID: Lawrence [name] MRN: [id number] DOB: [birth year] Age: 52 y.o.  Admit date: 11/17/2012 Discharge date: 11/18/2012 Admitting Physician: Uvaldo Rising, MD  PCP: [name], MD  Consultants:[facility])      Discharge Diagnosis: Principal Problem:   Dehiscence of amputation stump Active Problems:   DM (diabetes mellitus), type 2, uncontrolled   Chronic kidney disease (CKD), stage 4   Unilateral complete BKA--right    Hospital Course Lawrence [name] is a 52 y/o M s/p R BKA in July 2014 with wound dehiscence.  PMHx significant for CKD and Type II DM Uncontrolled  Problem List 1. RLE wound dehiscence s/p R BKA in July 2014.  Pt presented to ED with open RLE stump s/p fall.  Ortho was consulted upon admission, recommended admission for possible OR.  Labs in the ED were baseline normal and he was hemodynamically stable during his hospital stay.  A wet to dry dressing was placed, ortho evaluated the patient in the AM, and suture the area at that time bedside.  No S/Sx of infection and pt tolerated the procedure well.    2. CKD Stage II - Stable  3. DM II Uncontrolled - Stable, restarted home medications upon d/c.         Discharge PE   Filed Vitals:   11/18/12 0600  BP: 131/68  Pulse: 89  Temp: 98.2 F (36.8 C)  Resp: 16        Procedures/Imaging:  No results found.  Labs  CBC  Recent Labs Lab 11/17/12 2214 11/18/12 0500  WBC 6.1 4.6  HGB 9.7* 9.3*  HCT 27.7* 27.1*  PLT 138* 128*   BMET  Recent Labs Lab 11/17/12 2214 11/18/12 0500  NA 140 144  K 4.3 3.9  CL 106 110  CO2 21 25  BUN 38* 36*  CREATININE 1.27 1.23  CALCIUM 8.8 8.8  GLUCOSE 81 71      Disease.  APTT     Status: None   Collection Time    11/18/12  5:00 AM      Result Value Range   aPTT 27  24 - 37 seconds  PROTIME-INR     Status: None   Collection Time    11/18/12  5:00 AM      Result Value Range   Prothrombin Time 14.1  11.6 - 15.2  seconds   INR 1.11  0.00 - 1.49  CBC WITH DIFFERENTIAL     Status: Abnormal   Collection Time    11/18/12  5:00 AM      Result Value Range   WBC 4.6  4.0 - 10.5 K/uL   RBC 3.37 (*) 4.22 - 5.81 MIL/uL   Hemoglobin 9.3 (*) 13.0 - 17.0 g/dL   HCT 96.2 (*) 95.2 - 84.1 %   MCV 80.4  78.0 - 100.0 fL       Patient condition at time of discharge/disposition: stable  Disposition-home with home health    Follow up issues: 1. RLE wound dehiscence.  To F/U with Ortho  Discharge follow up:    Future Appointments Provider Department Dept Phone   11/23/2012 2:30 PM Garnetta Buddy, MD [facility] [phone]       Discharge Instructions: Please refer to Patient Instructions section of EMR for full details.  Patient was counseled important signs and symptoms that should prompt return to medical care, changes in medications, dietary instructions, activity restrictions, and follow up appointments.  Significant instructions  noted below:    Discharge Medications   Medication List         acetaminophen 325 MG tablet  Commonly known as:  TYLENOL  Take 325-650 mg by mouth every 4 (four) hours as needed for pain.     aspirin 81 MG tablet  Take 81 mg by mouth every morning.     bisacodyl 10 MG suppository  Commonly known as:  DULCOLAX  Place 1 suppository (10 mg total) rectally daily as needed.     HUMALOG 100 UNIT/ML injection  Generic drug:  insulin lispro  Inject 3 Units into the skin daily as needed for high blood sugar.     insulin glargine 100 UNIT/ML injection  Commonly known as:  LANTUS  Inject 0.25 mLs (25 Units total) into the skin at bedtime.     metoprolol tartrate 25 MG tablet  Commonly known as:  LOPRESSOR  Take 1 tablet (25 mg total) by mouth 2 (two) times daily.     oxyCODONE-acetaminophen 5-325 MG per tablet  Commonly known as:  PERCOCET/ROXICET  Take 1-2 tablets by mouth every 4 (four) hours as needed.     polyethylene glycol packet   Commonly known as:  MIRALAX / GLYCOLAX  Take 17 g by mouth daily.            Gildardo Cranker, DO of [facility] 11/18/2012 9:10 AM

## 2012-11-18 NOTE — Consult Note (Signed)
Reason for Consult:  Right BKA stump dehiscence Referring Physician:   ER MD  Lawrence [name] is an 52 y.o. male.  HPI:   52 yo male with history of previous right BKA and then a revision of this back in August.  He fell again yesterday and sustained an injury to his right BKA stump once again.  A laceration with wound dehiscence was note.  He was admitted by the [facility] Medicine Teaching Service.  Past Medical History  Diagnosis Date  . Hypertension   . Chronic kidney disease   . Hypercholesteremia   . Diabetes mellitus     insulin dependent  . Depression   . PONV (postoperative nausea and vomiting)     Past Surgical History  Procedure Laterality Date  . Amputation Right 05/03/2012    Procedure: Right First Ray AMPUTATION;  Surgeon: Eldred Manges, MD;  Location: [facility] OR;  Service: Orthopedics;  Laterality: Right;  . I&d extremity Right 05/29/2012    Procedure: IRRIGATION AND DEBRIDEMENT EXTREMITY- right foot;  Surgeon: Eldred Manges, MD;  Location: [facility] OR;  Service: Orthopedics;  Laterality: Right;  Right Foot Debridement, VAC Application  . Toe amputation Right 05/29/2012    1ST  ray      Dr Ophelia Charter  . Amputation Right 08/16/2012    Procedure: AMPUTATION BELOW KNEE;  Surgeon: Eldred Manges, MD;  Location: [facility] OR;  Service: Orthopedics;  Laterality: Right;  Right Below Knee Amputation  . Amputation Right 10/04/2012    Procedure: AMPUTATION BELOW KNEE REVISION;  Surgeon: Eldred Manges, MD;  Location: [facility] OR;  Service: Orthopedics;  Laterality: Right;    No family history on file.  Social History:  reports that he has never smoked. He has never used smokeless tobacco. He reports that he drinks alcohol. He reports that he does not use illicit drugs.  Allergies: No Known Allergies  Medications: I have reviewed the patient's current medications.  Results for orders placed during the hospital encounter of 11/17/12 (from the past 48 hour(s))  CBC WITH DIFFERENTIAL     Status: Abnormal   Collection  Time    11/17/12 10:14 PM      Result Value Range   WBC 6.1  4.0 - 10.5 K/uL   RBC 3.40 (*) 4.22 - 5.81 MIL/uL   Hemoglobin 9.7 (*) 13.0 - 17.0 g/dL   HCT 81.1 (*) 91.4 - 78.2 %   MCV 81.5  78.0 - 100.0 fL   MCH 28.5  26.0 - 34.0 pg   MCHC 35.0  30.0 - 36.0 g/dL   RDW 95.6  21.3 - 08.6 %   Platelets 138 (*) 150 - 400 K/uL   Neutrophils Relative % 66  43 - 77 %   Neutro Abs 4.0  1.7 - 7.7 K/uL   Lymphocytes Relative 23  12 - 46 %   Lymphs Abs 1.4  0.7 - 4.0 K/uL   Monocytes Relative 9  3 - 12 %   Monocytes Absolute 0.6  0.1 - 1.0 K/uL   Eosinophils Relative 2  0 - 5 %   Eosinophils Absolute 0.1  0.0 - 0.7 K/uL   Basophils Relative 1  0 - 1 %   Basophils Absolute 0.0  0.0 - 0.1 K/uL  BASIC METABOLIC PANEL     Status: Abnormal   Collection Time    11/17/12 10:14 PM      Result Value Range   Sodium 140  135 - 145 mEq/L   Potassium  4.3  3.5 - 5.1 mEq/L   Chloride 106  96 - 112 mEq/L   CO2 21  19 - 32 mEq/L   Glucose, Bld 81  70 - 99 mg/dL   BUN 38 (*) 6 - 23 mg/dL   Creatinine, Ser 1.61  0.50 - 1.35 mg/dL   Calcium 8.8  8.4 - 09.6 mg/dL   GFR calc non Af Amer 64 (*) >90 mL/min   GFR calc Af Amer 74 (*) >90 mL/min   Comment: (NOTE)     The eGFR has been calculated using the CKD EPI equation.     This calculation has not been validated in all clinical situations.     eGFR's persistently <90 mL/min signify possible Chronic Kidney     Disease.  GLUCOSE, CAPILLARY     Status: None   Collection Time    11/18/12  2:00 AM      Result Value Range   Glucose-Capillary 83  70 - 99 mg/dL  BASIC METABOLIC PANEL     Status: Abnormal   Collection Time    11/18/12  5:00 AM      Result Value Range   Sodium 144  135 - 145 mEq/L   Potassium 3.9  3.5 - 5.1 mEq/L   Chloride 110  96 - 112 mEq/L   CO2 25  19 - 32 mEq/L   Glucose, Bld 71  70 - 99 mg/dL   BUN 36 (*) 6 - 23 mg/dL   Creatinine, Ser 0.45  0.50 - 1.35 mg/dL   Calcium 8.8  8.4 - 40.9 mg/dL   GFR calc non Af Amer 66 (*) >90  mL/min   GFR calc Af Amer 77 (*) >90 mL/min   Comment: (NOTE)     The eGFR has been calculated using the CKD EPI equation.     This calculation has not been validated in all clinical situations.     eGFR's persistently <90 mL/min signify possible Chronic Kidney     Disease.  APTT     Status: None   Collection Time    11/18/12  5:00 AM      Result Value Range   aPTT 27  24 - 37 seconds  PROTIME-INR     Status: None   Collection Time    11/18/12  5:00 AM      Result Value Range   Prothrombin Time 14.1  11.6 - 15.2 seconds   INR 1.11  0.00 - 1.49  CBC WITH DIFFERENTIAL     Status: Abnormal   Collection Time    11/18/12  5:00 AM      Result Value Range   WBC 4.6  4.0 - 10.5 K/uL   RBC 3.37 (*) 4.22 - 5.81 MIL/uL   Hemoglobin 9.3 (*) 13.0 - 17.0 g/dL   HCT 81.1 (*) 91.4 - 78.2 %   MCV 80.4  78.0 - 100.0 fL   MCH 27.6  26.0 - 34.0 pg   MCHC 34.3  30.0 - 36.0 g/dL   RDW 95.6  21.3 - 08.6 %   Platelets 128 (*) 150 - 400 K/uL   Neutrophils Relative % 56  43 - 77 %   Neutro Abs 2.6  1.7 - 7.7 K/uL   Lymphocytes Relative 30  12 - 46 %   Lymphs Abs 1.4  0.7 - 4.0 K/uL   Monocytes Relative 11  3 - 12 %   Monocytes Absolute 0.5  0.1 - 1.0 K/uL   Eosinophils Relative 3  0 - 5 %   Eosinophils Absolute 0.1  0.0 - 0.7 K/uL   Basophils Relative 0  0 - 1 %   Basophils Absolute 0.0  0.0 - 0.1 K/uL  GLUCOSE, CAPILLARY     Status: None   Collection Time    11/18/12  6:26 AM      Result Value Range   Glucose-Capillary 70  70 - 99 mg/dL  GLUCOSE, CAPILLARY     Status: None   Collection Time    11/18/12  7:44 AM      Result Value Range   Glucose-Capillary 71  70 - 99 mg/dL   Comment 1 Documented in Chart     Comment 2 Notify RN      No results found.  ROS Blood pressure 131/68, pulse 89, temperature 98.2 F (36.8 C), temperature source Oral, resp. rate 16, SpO2 100.00%. Physical Exam  Musculoskeletal:       Right knee: He exhibits laceration.        Legs:   Assessment/Plan: Right BKA stump wound dehiscence  1)  I clean up the wound at the bedside and re-approximated his skin with interrupted 2-0 nylon suture. A clean, compressive dressing was then placed. 2)  He can be discharged to home today with follow-up in the office next week. 3)  He should leave his current dressing in place until follow-up.  Britley Gashi Y 11/18/2012, 9:15 AM

## 2012-11-18 NOTE — ED Notes (Signed)
Patient adamantly refusing IV at this time.  Receiving RN notified.

## 2012-11-18 NOTE — H&P (Addendum)
FMTS Attending Note  I personally saw and evaluated the patient. The plan of care was discussed with the resident team. I agree with the assessment and plan as documented by the resident.   Right BKA stump dehiscence. No evidence of infection. No foreign bodies identified on exam. Orthopedics in room to evaluate. They plan to suture the wound and follow up as outpatient. No plan for OR.   Donnella Sham MD

## 2012-11-18 NOTE — Progress Notes (Signed)
Patient refused x-ray, MD notified (Family Medicine MD), x-ray cancelled, no new orders

## 2012-11-23 ENCOUNTER — Ambulatory Visit: Payer: Self-pay | Admitting: Family Medicine

## 2012-11-24 ENCOUNTER — Encounter (HOSPITAL_COMMUNITY): Payer: Self-pay | Admitting: Emergency Medicine

## 2012-11-24 ENCOUNTER — Emergency Department (HOSPITAL_COMMUNITY)
Admission: EM | Admit: 2012-11-24 | Discharge: 2012-11-24 | Disposition: A | Discharge status: Home / Self Care | Payer: Medicaid Other | Attending: Emergency Medicine | Admitting: Emergency Medicine

## 2012-11-24 DIAGNOSIS — N189 Chronic kidney disease, unspecified: Secondary | ICD-10-CM | POA: Insufficient documentation

## 2012-11-24 DIAGNOSIS — Z794 Long term (current) use of insulin: Secondary | ICD-10-CM | POA: Insufficient documentation

## 2012-11-24 DIAGNOSIS — F329 Major depressive disorder, single episode, unspecified: Secondary | ICD-10-CM | POA: Insufficient documentation

## 2012-11-24 DIAGNOSIS — T839XXA Unspecified complication of genitourinary prosthetic device, implant and graft, initial encounter: Secondary | ICD-10-CM

## 2012-11-24 DIAGNOSIS — R300 Dysuria: Secondary | ICD-10-CM | POA: Insufficient documentation

## 2012-11-24 DIAGNOSIS — E7800 Pure hypercholesterolemia, unspecified: Secondary | ICD-10-CM | POA: Insufficient documentation

## 2012-11-24 DIAGNOSIS — I129 Hypertensive chronic kidney disease with stage 1 through stage 4 chronic kidney disease, or unspecified chronic kidney disease: Secondary | ICD-10-CM | POA: Insufficient documentation

## 2012-11-24 DIAGNOSIS — Y838 Other surgical procedures as the cause of abnormal reaction of the patient, or of later complication, without mention of misadventure at the time of the procedure: Secondary | ICD-10-CM | POA: Insufficient documentation

## 2012-11-24 DIAGNOSIS — R269 Unspecified abnormalities of gait and mobility: Secondary | ICD-10-CM | POA: Insufficient documentation

## 2012-11-24 DIAGNOSIS — S88119A Complete traumatic amputation at level between knee and ankle, unspecified lower leg, initial encounter: Secondary | ICD-10-CM | POA: Insufficient documentation

## 2012-11-24 DIAGNOSIS — I10 Essential (primary) hypertension: Secondary | ICD-10-CM

## 2012-11-24 DIAGNOSIS — T83091A Other mechanical complication of indwelling urethral catheter, initial encounter: Secondary | ICD-10-CM | POA: Insufficient documentation

## 2012-11-24 DIAGNOSIS — Z7982 Long term (current) use of aspirin: Secondary | ICD-10-CM | POA: Insufficient documentation

## 2012-11-24 DIAGNOSIS — E119 Type 2 diabetes mellitus without complications: Secondary | ICD-10-CM | POA: Insufficient documentation

## 2012-11-24 DIAGNOSIS — F3289 Other specified depressive episodes: Secondary | ICD-10-CM | POA: Insufficient documentation

## 2012-11-24 DIAGNOSIS — Z79899 Other long term (current) drug therapy: Secondary | ICD-10-CM | POA: Insufficient documentation

## 2012-11-24 NOTE — ED Notes (Signed)
Pt. Is here because his urinary bag has ruptured.  Pt. Denies any pain or discomfort.  Pt. Needs his bag changed.

## 2012-11-24 NOTE — ED Notes (Signed)
Leg bag changed out with new one.

## 2012-11-24 NOTE — ED Provider Notes (Signed)
CSN: [id number]     Arrival date & time 11/24/12  0721 History   None    Chief Complaint  Patient presents with  . Urinary Retention    HPI  Lawrence [name] is a 52 y.o. male with a PMH of HTN, CKD, HLD, DM, and depression who presents to the ED for evaluation of urinary retention.  History was provided by the patient.  Patient states he developed leaking from his urine bag last night.  He states that the bag has been leaking urine.  Patient states that he has had this Foley catheter in for a few weeks. Patient states he has been producing urine adequately and denies any decreased urination. He denies any pelvic or abdominal pain, nausea, vomiting, pain in his groin/Foley catheter, or other concerns.  He denies any trauma to the Foley catheter or bleeding from the catheter site. He denies any foul urine orders.  He states he otherwise has been fine with no recent fevers, chills, change in appetite or activity, rhinorrhea, congestion, cough, chest pain, shortness of breath, headache, lightheadedness, dizziness, or weakness.  Patient states he has not taken his BP medications this morning because he was told by EMS not to take anything if going to the hospital.  Patient has an appointment with his orthopedic physician today and followup with his PCP next week with his urologist next week Wednesday.  He states they are doing a scope to further evaluate his condition.   Past Medical History  Diagnosis Date  . Hypertension   . Chronic kidney disease   . Hypercholesteremia   . Diabetes mellitus     insulin dependent  . Depression   . PONV (postoperative nausea and vomiting)    Past Surgical History  Procedure Laterality Date  . Amputation Right 05/03/2012    Procedure: Right First Ray AMPUTATION;  Surgeon: Eldred Manges, MD;  Location: [facility] OR;  Service: Orthopedics;  Laterality: Right;  . I&d extremity Right 05/29/2012    Procedure: IRRIGATION AND DEBRIDEMENT EXTREMITY- right foot;  Surgeon: Eldred Manges, MD;  Location: [facility] OR;  Service: Orthopedics;  Laterality: Right;  Right Foot Debridement, VAC Application  . Toe amputation Right 05/29/2012    1ST  ray      Dr Ophelia Charter  . Amputation Right 08/16/2012    Procedure: AMPUTATION BELOW KNEE;  Surgeon: Eldred Manges, MD;  Location: [facility] OR;  Service: Orthopedics;  Laterality: Right;  Right Below Knee Amputation  . Amputation Right 10/04/2012    Procedure: AMPUTATION BELOW KNEE REVISION;  Surgeon: Eldred Manges, MD;  Location: [facility] OR;  Service: Orthopedics;  Laterality: Right;   No family history on file. History  Substance Use Topics  . Smoking status: Never Smoker   . Smokeless tobacco: Never Used  . Alcohol Use: Yes     Comment: rarely    Review of Systems  Constitutional: Negative for fever, chills, activity change, appetite change and fatigue.  HENT: Negative for congestion, rhinorrhea and sore throat.   Eyes: Negative for photophobia and visual disturbance.  Respiratory: Negative for cough, shortness of breath and wheezing.   Cardiovascular: Negative for chest pain and leg swelling.  Gastrointestinal: Negative for nausea, vomiting, abdominal pain, diarrhea and constipation.  Genitourinary: Positive for difficulty urinating (at baseline). Negative for dysuria, urgency, hematuria, decreased urine volume, scrotal swelling, enuresis and penile pain.  Musculoskeletal: Positive for gait problem (at baseline due to amputation). Negative for back pain.  Skin: Negative for wound.  Neurological: Negative for dizziness, weakness, light-headedness and headaches.    Allergies  Review of patient's allergies indicates no known allergies.  Home Medications   Current Outpatient Rx  Name  Route  Sig  Dispense  Refill  . acetaminophen (TYLENOL) 325 MG tablet   Oral   Take 325-650 mg by mouth every 4 (four) hours as needed for pain.         Marland Kitchen aspirin 81 MG tablet   Oral   Take 81 mg by mouth every morning.          . bisacodyl (DULCOLAX) 10  MG suppository   Rectal   Place 1 suppository (10 mg total) rectally daily as needed.   12 suppository   0   . insulin glargine (LANTUS) 100 UNIT/ML injection   Subcutaneous   Inject 0.25 mLs (25 Units total) into the skin at bedtime.   10 mL   6   . insulin lispro (HUMALOG) 100 UNIT/ML injection   Subcutaneous   Inject 3 Units into the skin daily as needed for high blood sugar.         . metoprolol tartrate (LOPRESSOR) 25 MG tablet   Oral   Take 1 tablet (25 mg total) by mouth 2 (two) times daily.   180 tablet   3   . oxyCODONE-acetaminophen (PERCOCET/ROXICET) 5-325 MG per tablet   Oral   Take 1-2 tablets by mouth every 4 (four) hours as needed.   240 tablet   0   . polyethylene glycol (MIRALAX / GLYCOLAX) packet   Oral   Take 17 g by mouth daily.          BP 170/68  Pulse 64  Temp(Src) 98.8 F (37.1 C) (Oral)  Resp 16  SpO2 97%  Filed Vitals:   11/24/12 0734 11/24/12 0841  BP: 170/68 157/60  Pulse: 64 60  Temp: 98.8 F (37.1 C)   TempSrc: Oral   Resp: 16 16  SpO2: 97% 100%    Physical Exam  Constitutional: He is oriented to person, place, and time. He appears well-developed and well-nourished. No distress.  HENT:  Head: Normocephalic and atraumatic.  Right Ear: External ear normal.  Left Ear: External ear normal.  Nose: Nose normal.  Mouth/Throat: Oropharynx is clear and moist. No oropharyngeal exudate.  Eyes: Conjunctivae are normal. Pupils are equal, round, and reactive to light. Right eye exhibits no discharge. Left eye exhibits no discharge.  Neck: Normal range of motion. Neck supple.  Cardiovascular: Normal rate, regular rhythm, normal heart sounds and intact distal pulses.  Exam reveals no gallop and no friction rub.   No murmur heard. Pulmonary/Chest: Effort normal and breath sounds normal. No respiratory distress. He has no wheezes. He has no rales. He exhibits no tenderness.  Abdominal: Soft. Bowel sounds are normal. He exhibits no  distension and no mass. There is no tenderness. There is no rebound and no guarding.  Musculoskeletal: Normal range of motion. He exhibits no edema and no tenderness.  Foley bag present on left lower extremity.  Urine present in the bag which is yellow without gross hematuria.   Neurological: He is alert and oriented to person, place, and time.  Skin: Skin is warm and dry. He is not diaphoretic.    ED Course  Procedures (including critical care time) Labs Review Labs Reviewed - No data to display Imaging Review No results found.  EKG Interpretation   None       MDM   1. Foley  catheter problem, initial encounter   2. Hypertension     Lawrence [name] is a 52 y.o. male with a PMH of HTN, CKD, HLD, DM, and depression who presents to the ED for evaluation of urinary retention. Patient's urinary Foley bag was changed.    Patient was evaluated in emergency department for a Foley catheter complaint. Patient complained of urine leaking from the bag. He received a new bag in the emergency department.  Patient had no complaints of pain and was otherwise asymptomatic. He was found to be hypertensive likely due to medication non-compliance.  He was instructed to take his anti-hypertensive medications when he gets home.  He was instructed to follow-up with his urologist next week at his scheduled appointment.  Patient was instructed to return to the ED if they experience any fever, abdominal pain, or other concerns.  Patient was in agreement with discharge and plan.     Final impressions: 1. Foley catheter problem 2. Hypertension    Luiz Iron PA-C   This patient was discussed with Dr. Candise Bowens, PA-C 11/26/12 1931

## 2012-11-26 NOTE — ED Provider Notes (Signed)
Medical screening examination/treatment/procedure(s) were performed by non-physician practitioner and as supervising physician I was immediately available for consultation/collaboration.  Glynn Octave, MD 11/26/12 1945

## 2012-11-29 NOTE — Progress Notes (Signed)
Patient ID: Lawrence [name], male   DOB: [birth year], 52 y.o.   MRN: [id number]           PROGRESS NOTE  DATE:  11/17/2012  FACILITY: [facility]    LEVEL OF CARE:   SNF   Acute Visit/Discharge Visit     HISTORY OF PRESENT ILLNESS:  Lawrence [name] is a gentleman who came to [place] in July after a right below-knee amputation.  He is a diabetic.    I believe he had a fall in the facility and had a wound dehiscence.  He was readmitted to hospital in late August.  The wound has since closed.    He is ready for discharge.  He has his own walker.  He knows how to give himself his own insulin.    CURRENT MEDICATIONS:  Discharge medications include:    Enteric-coated aspirin 81 q.d.     Neurontin 100 mg at bedtime.    Lantus insulin 25 U at bedtime.    Humalog insulin 3 U a.c. meals if blood sugar is greater than 150.    Lopressor 25 b.i.d.    Flomax 0.4 b.i.d.  He is followed by Urology.     ASSESSMENT/PLAN:  Type 2 diabetes with macrovascular disease.  Status post below-knee amputation.  He is going home with a walker.    I do not see any medical issues to preclude his discharge.  He tells me he followed with Dr. Ophelia Charter and has an appointment with [facility].    CPT CODE: [id number]

## 2012-11-29 NOTE — ED Provider Notes (Signed)
I have supervised the resident on the management of this patient and agree with the note above. I personally interviewed and examined the patient and my addendum is below.   Lawrence [name] is a 52 y.o. 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 Fell and hit the R leg then had bleeding afterwards. Has some wound dehiscence. No active bleeding now. Ortho consulted and will see in AM. Hg stable. Will admit for observation overnight.   I have personally reviewed the EKG and agreed with the resident's interpretation    Richardean Canal, MD 11/18/12 2013  Richardean Canal, MD 11/29/12 [phone]

## 2012-12-18 ENCOUNTER — Encounter (HOSPITAL_COMMUNITY): Payer: Self-pay | Admitting: Emergency Medicine

## 2012-12-18 ENCOUNTER — Emergency Department (HOSPITAL_COMMUNITY)
Admission: EM | Admit: 2012-12-18 | Discharge: 2012-12-18 | Disposition: A | Discharge status: Skilled Nursing Facility | Payer: Medicaid Other | Attending: Emergency Medicine | Admitting: Emergency Medicine

## 2012-12-18 DIAGNOSIS — S88119A Complete traumatic amputation at level between knee and ankle, unspecified lower leg, initial encounter: Secondary | ICD-10-CM | POA: Insufficient documentation

## 2012-12-18 DIAGNOSIS — Z7982 Long term (current) use of aspirin: Secondary | ICD-10-CM | POA: Insufficient documentation

## 2012-12-18 DIAGNOSIS — N189 Chronic kidney disease, unspecified: Secondary | ICD-10-CM | POA: Insufficient documentation

## 2012-12-18 DIAGNOSIS — T839XXA Unspecified complication of genitourinary prosthetic device, implant and graft, initial encounter: Secondary | ICD-10-CM

## 2012-12-18 DIAGNOSIS — T83091A Other mechanical complication of indwelling urethral catheter, initial encounter: Secondary | ICD-10-CM | POA: Insufficient documentation

## 2012-12-18 DIAGNOSIS — Z8659 Personal history of other mental and behavioral disorders: Secondary | ICD-10-CM | POA: Insufficient documentation

## 2012-12-18 DIAGNOSIS — E119 Type 2 diabetes mellitus without complications: Secondary | ICD-10-CM | POA: Insufficient documentation

## 2012-12-18 DIAGNOSIS — Z79899 Other long term (current) drug therapy: Secondary | ICD-10-CM | POA: Insufficient documentation

## 2012-12-18 DIAGNOSIS — I129 Hypertensive chronic kidney disease with stage 1 through stage 4 chronic kidney disease, or unspecified chronic kidney disease: Secondary | ICD-10-CM | POA: Insufficient documentation

## 2012-12-18 DIAGNOSIS — Z794 Long term (current) use of insulin: Secondary | ICD-10-CM | POA: Insufficient documentation

## 2012-12-18 DIAGNOSIS — R300 Dysuria: Secondary | ICD-10-CM | POA: Insufficient documentation

## 2012-12-18 DIAGNOSIS — Y846 Urinary catheterization as the cause of abnormal reaction of the patient, or of later complication, without mention of misadventure at the time of the procedure: Secondary | ICD-10-CM | POA: Insufficient documentation

## 2012-12-18 NOTE — Progress Notes (Addendum)
ED CM received referral to see patient in regards to leaking indwelling foley to leg bag. Pt present to ED via EMS for leaking foley bag, pt was seen here 2 weeks ago with same complaint. In to speak with patient. Pt is a  BKA and is Medicaid pending  Pt reports that he comes here because it is easier for [place] to check catheter and change leg bag. Pt reports that Urologist Dr. Brunilda Payor placed the catheter a couple of months ago and changes the catheter when it is time. Discussed the importance of following up with the Physician that is managing the catheter to foster the continuity of care. Also, discussed when the patient should call EMS for transport to an  ED, and the cost associated with EMS rides.  I told the patient we will contact the Unorlgist office to have them follow up with his catheter management. While speaking to patient he refused to acknowledge me or make eye contact. Discussed discharge plan with Tiffany and Dr. Ethelda Chick. CM will contact Dr. Madilyn Hook Nurse.

## 2012-12-18 NOTE — ED Notes (Signed)
Pt refuses to be changed into gown.

## 2012-12-18 NOTE — ED Provider Notes (Signed)
CSN: [id number]     Arrival date & time 12/18/12  1320 History   First MD Initiated Contact with Patient 12/18/12 1355     Chief Complaint  Patient presents with  . Catheter bag leakage    (Consider location/radiation/quality/duration/timing/severity/associated sxs/prior Treatment) HPI Patient reports his Foley catheter leg bag began to leak yesterday. He is asymptomatic. He comes leg bag replacement. Has had chronic indwelling Foley catheter for several months. Past Medical History  Diagnosis Date  . Hypertension   . Chronic kidney disease   . Hypercholesteremia   . Diabetes mellitus     insulin dependent  . Depression   . PONV (postoperative nausea and vomiting)    Past Surgical History  Procedure Laterality Date  . Amputation Right 05/03/2012    Procedure: Right First Ray AMPUTATION;  Surgeon: Eldred Manges, MD;  Location: [facility] OR;  Service: Orthopedics;  Laterality: Right;  . I&d extremity Right 05/29/2012    Procedure: IRRIGATION AND DEBRIDEMENT EXTREMITY- right foot;  Surgeon: Eldred Manges, MD;  Location: [facility] OR;  Service: Orthopedics;  Laterality: Right;  Right Foot Debridement, VAC Application  . Toe amputation Right 05/29/2012    1ST  ray      Dr Ophelia Charter  . Amputation Right 08/16/2012    Procedure: AMPUTATION BELOW KNEE;  Surgeon: Eldred Manges, MD;  Location: [facility] OR;  Service: Orthopedics;  Laterality: Right;  Right Below Knee Amputation  . Amputation Right 10/04/2012    Procedure: AMPUTATION BELOW KNEE REVISION;  Surgeon: Eldred Manges, MD;  Location: [facility] OR;  Service: Orthopedics;  Laterality: Right;   History reviewed. No pertinent family history. History  Substance Use Topics  . Smoking status: Never Smoker   . Smokeless tobacco: Never Used  . Alcohol Use: Yes     Comment: rarely    Review of Systems  Constitutional: Negative.   Respiratory: Negative.   Cardiovascular: Negative.   Gastrointestinal: Negative.   Genitourinary: Positive for difficulty urinating.   Chronic indwelling Foley catheter  Musculoskeletal: Negative.   Skin: Negative.   Neurological: Negative.   Psychiatric/Behavioral: Negative.     Allergies  Review of patient's allergies indicates no known allergies.  Home Medications   Current Outpatient Rx  Name  Route  Sig  Dispense  Refill  . acetaminophen (TYLENOL) 500 MG tablet   Oral   Take 1,000 mg by mouth every 6 (six) hours as needed.         Marland Kitchen aspirin 81 MG tablet   Oral   Take 81 mg by mouth every morning.          Marland Kitchen HYDROcodone-acetaminophen (NORCO/VICODIN) 5-325 MG per tablet   Oral   Take 1 tablet by mouth every 6 (six) hours as needed for moderate pain.         Marland Kitchen insulin glargine (LANTUS) 100 UNIT/ML injection   Subcutaneous   Inject 20 Units into the skin at bedtime.         . insulin lispro (HUMALOG) 100 UNIT/ML injection   Subcutaneous   Inject 2-3 Units into the skin daily as needed for high blood sugar.          . metoprolol tartrate (LOPRESSOR) 25 MG tablet   Oral   Take 25 mg by mouth daily.         . tamsulosin (FLOMAX) 0.4 MG CAPS capsule   Oral   Take 0.4 mg by mouth 2 (two) times daily.  BP 161/79  Pulse 64  Temp(Src) 98.6 F (37 C) (Oral)  Resp 16  SpO2 100% Physical Exam  Nursing note and vitals reviewed. Constitutional: He appears well-developed and well-nourished.  HENT:  Head: Normocephalic and atraumatic.  Eyes: Conjunctivae are normal. Pupils are equal, round, and reactive to light.  Neck: Neck supple. No tracheal deviation present. No thyromegaly present.  Cardiovascular: Normal rate and regular rhythm.   No murmur heard. Pulmonary/Chest: Effort normal and breath sounds normal.  Abdominal: Soft. Bowel sounds are normal. He exhibits no distension. There is no tenderness.  Musculoskeletal: Normal range of motion. He exhibits no edema and no tenderness.  Right BKA   Neurological: He is alert. Coordination normal.  Skin: Skin is warm and dry. No rash  noted.  Psychiatric: He has a normal mood and affect.    ED Course  Procedures (including critical care time) Labs Review Labs Reviewed - No data to display Imaging Review No results found.  EKG Interpretation   None      Foley catheter bag was replaced in the emergency department. Case worker was called. She contacted his urology office to arrange for followup. MDM  No diagnosis found. Diagnosis #1 Foley catheter malfunction #2 hypertension    Doug Sou, MD 12/18/12 1523

## 2012-12-18 NOTE — ED Notes (Signed)
Dr. Ethelda Chick spoke with pt about following up with urology for further management of catheter care. Pt was upset and asked for Dr. Ethelda Chick supervising doctor. Charge nurse made aware.

## 2012-12-18 NOTE — ED Notes (Signed)
Attempted to show pt how to replace catheter bag, pt was not interested in learning.

## 2012-12-18 NOTE — ED Notes (Signed)
Pt c/o catheter bag leakage. Pt states the actual catheter is not leaking just the bag. He has this happen every 3-4wks and comes here to get a new bag. Pt has no other complaints and no distress noted. Pt was here two weeks ago for the same.

## 2012-12-18 NOTE — Progress Notes (Signed)
ED CM contacted [facility] to discuss the ED visits patient has made without contacting the them in regards to indwelling catheter management. Spoke with Elnita Maxwell she states that patient has a follow up appointment on December 9th, but has not contacted them with any issues about the catheter and the leakage. She stated, the office will contact him to follow up. No Further CM need identified.

## 2012-12-18 NOTE — ED Notes (Signed)
Case management at bedside to speak with pt about getting supplies for catheter.

## 2012-12-20 ENCOUNTER — Telehealth: Payer: Self-pay | Admitting: Family Medicine

## 2012-12-20 NOTE — Telephone Encounter (Signed)
Pt called and would like Dr. [name] to call him about an appointment he has coming up. JW

## 2012-12-22 MED ORDER — INSULIN LISPRO 100 UNIT/ML ~~LOC~~ SOLN: 2.0000 [IU] | Freq: Every day | SUBCUTANEOUS | Status: DC | PRN

## 2012-12-22 MED ORDER — INSULIN GLARGINE 100 UNIT/ML ~~LOC~~ SOLN: 20.0000 [IU] | Freq: Every day | SUBCUTANEOUS | Status: DC

## 2012-12-22 NOTE — Telephone Encounter (Signed)
It is okay to give Lawrence [name] a limited amount of Lantus samples; would ask him when he expects to get his Medicaid.  If he would like a refill prescription sent, I will do this (sending now).  JB

## 2012-12-22 NOTE — Telephone Encounter (Signed)
Pt called and said he is Medicaid pending and that Dr. [name] sometimes gives him samples.  Pt then states he has no way of getting up here.  I told patient I would discuss leaving medication sample up front or we could call in prescription.  Pt states he has no way to get up here.  Will forward to Dr. [name] as an Lorain Childes.  Sukhraj Esquivias, Darlyne Russian, CMA

## 2012-12-25 ENCOUNTER — Telehealth: Payer: Self-pay | Admitting: Family Medicine

## 2012-12-25 NOTE — Telephone Encounter (Signed)
Spoke with patient.  He has been Medicaid pending since March 2014.  Pt states he has been denied twice and has a third hearing on this Friday for Medicaid. He has enough Humalog but is in need of Lantus.  Gave patient one vial of Lantus 100units/mL.  NDC:0088-2220-34  EXP:04/2014  LOT: [id number]. Mireille Lacombe, Darlyne Russian, CMA

## 2012-12-25 NOTE — Telephone Encounter (Signed)
Pt called and wanted to know if we had any samples of insulin that he takes. If so please call him so someone can pick it up. JW

## 2013-01-09 ENCOUNTER — Encounter (HOSPITAL_COMMUNITY): Payer: Self-pay | Admitting: Emergency Medicine

## 2013-01-09 ENCOUNTER — Emergency Department (HOSPITAL_COMMUNITY)
Admission: EM | Admit: 2013-01-09 | Discharge: 2013-01-09 | Disposition: A | Discharge status: Skilled Nursing Facility | Payer: Medicaid Other | Attending: Emergency Medicine | Admitting: Emergency Medicine

## 2013-01-09 DIAGNOSIS — Z794 Long term (current) use of insulin: Secondary | ICD-10-CM | POA: Insufficient documentation

## 2013-01-09 DIAGNOSIS — S88119A Complete traumatic amputation at level between knee and ankle, unspecified lower leg, initial encounter: Secondary | ICD-10-CM | POA: Insufficient documentation

## 2013-01-09 DIAGNOSIS — T83091A Other mechanical complication of indwelling urethral catheter, initial encounter: Secondary | ICD-10-CM | POA: Insufficient documentation

## 2013-01-09 DIAGNOSIS — S98139A Complete traumatic amputation of one unspecified lesser toe, initial encounter: Secondary | ICD-10-CM | POA: Insufficient documentation

## 2013-01-09 DIAGNOSIS — Z8659 Personal history of other mental and behavioral disorders: Secondary | ICD-10-CM | POA: Insufficient documentation

## 2013-01-09 DIAGNOSIS — Z79899 Other long term (current) drug therapy: Secondary | ICD-10-CM | POA: Insufficient documentation

## 2013-01-09 DIAGNOSIS — E119 Type 2 diabetes mellitus without complications: Secondary | ICD-10-CM | POA: Insufficient documentation

## 2013-01-09 DIAGNOSIS — Z7982 Long term (current) use of aspirin: Secondary | ICD-10-CM | POA: Insufficient documentation

## 2013-01-09 DIAGNOSIS — I129 Hypertensive chronic kidney disease with stage 1 through stage 4 chronic kidney disease, or unspecified chronic kidney disease: Secondary | ICD-10-CM | POA: Insufficient documentation

## 2013-01-09 DIAGNOSIS — N189 Chronic kidney disease, unspecified: Secondary | ICD-10-CM | POA: Insufficient documentation

## 2013-01-09 DIAGNOSIS — T839XXA Unspecified complication of genitourinary prosthetic device, implant and graft, initial encounter: Secondary | ICD-10-CM

## 2013-01-09 DIAGNOSIS — Y846 Urinary catheterization as the cause of abnormal reaction of the patient, or of later complication, without mention of misadventure at the time of the procedure: Secondary | ICD-10-CM | POA: Insufficient documentation

## 2013-01-09 NOTE — ED Notes (Signed)
Pt. arrived with EMS from home requesting replacement of his leaking urinary leg bag . Denies urinary discomfort , indwelling urinary catheter intact and draining . New leg bag applied by triage nurse .

## 2013-01-09 NOTE — ED Notes (Signed)
Pt states his catheter bag started leaking at 8pm. States the tech replaced the bag on the way to room, states he doesn't need to be here anymore. Denies pain, denies CP, SOB, urine problems, N/V/D. Denies any symptoms.

## 2013-01-09 NOTE — ED Provider Notes (Signed)
CSN: [id number]     Arrival date & time 01/09/13  1957 History   First MD Initiated Contact with Patient 01/09/13 2034     No chief complaint on file.  (Consider location/radiation/quality/duration/timing/severity/associated sxs/prior Treatment) HPI Comments: 52 year old male presents for problems with his Foley catheter bag. He states his that began leaking 1.5 hours ago. Patient states this is a recurrent problem it usually happens every 3 weeks. Patient followed by Dr. Brunilda Payor of urology. He denies fever, chest pain, shortness of breath, abdominal pain, nausea or vomiting, urinary discomfort or hematuria, and numbness or tingling.  The history is provided by the patient. No language interpreter was used.    Past Medical History  Diagnosis Date  . Hypertension   . Chronic kidney disease   . Hypercholesteremia   . Diabetes mellitus     insulin dependent  . Depression   . PONV (postoperative nausea and vomiting)    Past Surgical History  Procedure Laterality Date  . Amputation Right 05/03/2012    Procedure: Right First Ray AMPUTATION;  Surgeon: Eldred Manges, MD;  Location: [facility] OR;  Service: Orthopedics;  Laterality: Right;  . I&d extremity Right 05/29/2012    Procedure: IRRIGATION AND DEBRIDEMENT EXTREMITY- right foot;  Surgeon: Eldred Manges, MD;  Location: [facility] OR;  Service: Orthopedics;  Laterality: Right;  Right Foot Debridement, VAC Application  . Toe amputation Right 05/29/2012    1ST  ray      Dr Ophelia Charter  . Amputation Right 08/16/2012    Procedure: AMPUTATION BELOW KNEE;  Surgeon: Eldred Manges, MD;  Location: [facility] OR;  Service: Orthopedics;  Laterality: Right;  Right Below Knee Amputation  . Amputation Right 10/04/2012    Procedure: AMPUTATION BELOW KNEE REVISION;  Surgeon: Eldred Manges, MD;  Location: [facility] OR;  Service: Orthopedics;  Laterality: Right;   No family history on file. History  Substance Use Topics  . Smoking status: Never Smoker   . Smokeless tobacco: Never Used  . Alcohol  Use: Yes     Comment: rarely    Review of Systems  Genitourinary:       +foley catheter problem  All other systems reviewed and are negative.    Allergies  Review of patient's allergies indicates no known allergies.  Home Medications   Current Outpatient Rx  Name  Route  Sig  Dispense  Refill  . acetaminophen (TYLENOL) 500 MG tablet   Oral   Take 1,000 mg by mouth every 6 (six) hours as needed for mild pain.          Marland Kitchen aspirin 81 MG tablet   Oral   Take 81 mg by mouth every morning.          Marland Kitchen HYDROcodone-acetaminophen (NORCO/VICODIN) 5-325 MG per tablet   Oral   Take 1 tablet by mouth every 6 (six) hours as needed for moderate pain.         Marland Kitchen insulin glargine (LANTUS) 100 UNIT/ML injection   Subcutaneous   Inject 0.2 mLs (20 Units total) into the skin at bedtime.   10 mL   6   . insulin lispro (HUMALOG) 100 UNIT/ML injection   Subcutaneous   Inject 2-3 Units into the skin daily as needed for high blood sugar.   10 mL   6   . metoprolol tartrate (LOPRESSOR) 25 MG tablet   Oral   Take 25 mg by mouth daily.         . tamsulosin (FLOMAX) 0.4 MG  CAPS capsule   Oral   Take 0.4 mg by mouth 2 (two) times daily.          BP 157/58  Pulse 63  Temp(Src) 98.3 F (36.8 C) (Oral)  Resp 14  Ht 5\' 5"  (1.651 m)  Wt 169 lb (76.658 kg)  BMI 28.12 kg/m2  SpO2 100%  Physical Exam  Nursing note and vitals reviewed. Constitutional: He is oriented to person, place, and time. He appears well-developed and well-nourished. No distress.  HENT:  Head: Normocephalic and atraumatic.  Eyes: Conjunctivae and EOM are normal. No scleral icterus.  Neck: Normal range of motion.  Cardiovascular: Normal rate, regular rhythm and normal heart sounds.   Pulmonary/Chest: Effort normal and breath sounds normal. No respiratory distress. He has no wheezes. He has no rales.  Abdominal: Soft. There is no tenderness. There is no rebound and no guarding.  Genitourinary:  + foley  catheter; draining.  Musculoskeletal: Normal range of motion.  Right BKA  Neurological: He is alert and oriented to person, place, and time.  GCS 15; moves extremities without ataxia.  Skin: Skin is warm and dry. No rash noted. He is not diaphoretic. No erythema. No pallor.  Psychiatric: He has a normal mood and affect. His behavior is normal.    ED Course  Procedures (including critical care time) Labs Review Labs Reviewed - No data to display Imaging Review No results found.  EKG Interpretation   None       MDM   1. Foley catheter problem, initial encounter    52 year old male presents for his leaking foley catheter bag. This is a recurrent problem that the patient has been seen for in the past. He states that foley bag leaks approximately every 3 weeks. Bag changed today by triage nurse without any complications; foley draining. Patient denies any other associated symptoms. He is hemodynamically stable and appropriate for discharge with urology followup as needed. Return precautions discussed and patient agreeable to plan with no unaddressed concerns.   Filed Vitals:   01/09/13 2028  BP: 157/58  Pulse: 63  Temp: 98.3 F (36.8 C)  Resp: [address], [place] 01/09/13 2048

## 2013-01-09 NOTE — ED Notes (Signed)
Secretary informed to call [facility].

## 2013-01-10 NOTE — ED Provider Notes (Signed)
Medical screening examination/treatment/procedure(s) were performed by non-physician practitioner and as supervising physician I was immediately available for consultation/collaboration.  EKG Interpretation   None         Candyce Churn, MD 01/10/13 1242

## 2013-01-18 NOTE — Progress Notes (Signed)
Patient ID: Lawrence [name], male   DOB: [birth year], 52 y.o.   MRN: [id number]     [facility]  No Known Allergies  Chief Complaint  Patient presents with  . Acute Visit    bleeding from stump    HPI  He fell this morning in the therapy room; landing in his stump causing the incision line to open up with active bleeding present. I have applied direct pressure to the wound and applied a pressure dressing in order to minimize the bleeding and to keep the tissue together long enough for him to go to the ed for further workup.   Past Medical History  Diagnosis Date  . Hypertension   . Chronic kidney disease   . Hypercholesteremia   . Diabetes mellitus     insulin dependent  . Depression   . PONV (postoperative nausea and vomiting)     Past Surgical History  Procedure Laterality Date  . Amputation Right 05/03/2012    Procedure: Right First Ray AMPUTATION;  Surgeon: Eldred Manges, MD;  Location: [facility] OR;  Service: Orthopedics;  Laterality: Right;  . I&d extremity Right 05/29/2012    Procedure: IRRIGATION AND DEBRIDEMENT EXTREMITY- right foot;  Surgeon: Eldred Manges, MD;  Location: [facility] OR;  Service: Orthopedics;  Laterality: Right;  Right Foot Debridement, VAC Application  . Toe amputation Right 05/29/2012    1ST  ray      Dr Ophelia Charter  . Amputation Right 08/16/2012    Procedure: AMPUTATION BELOW KNEE;  Surgeon: Eldred Manges, MD;  Location: [facility] OR;  Service: Orthopedics;  Laterality: Right;  Right Below Knee Amputation   Filed Vitals:   01/18/13 1007  BP: 110/68  Pulse: 86  Height: 5\' 5"  (1.651 m)  Weight: 194 lb (87.998 kg)    MEDICATIONS REVIEWED  LABS REVIEWED  Review of Systems  Constitutional: Negative for malaise/fatigue.  Cardiovascular: Negative for chest pain and palpitations.  Skin:       Right stump open and actively bleeding    Physical Exam  Constitutional: He is oriented to person, place, and time. He appears well-developed and well-nourished. He appears  distressed.  Cardiovascular: Normal rate and regular rhythm.   Respiratory: Breath sounds normal. He is in respiratory distress.  Has increased effort and rate present   Neurological: He is alert and oriented to person, place, and time.  Skin: He is diaphoretic.  Right stump  Incision line completely open with copious amount of bleeding present.      ASSESSMENT/PLAN  Will send him the ed for further evaluation and treatment will continue; he will need a revision of his stump.   Time spent with patient 40 minutes

## 2013-01-26 ENCOUNTER — Encounter (HOSPITAL_COMMUNITY): Payer: Self-pay | Admitting: Emergency Medicine

## 2013-01-26 ENCOUNTER — Emergency Department (HOSPITAL_COMMUNITY)
Admission: EM | Admit: 2013-01-26 | Discharge: 2013-01-27 | Disposition: A | Discharge status: Home / Self Care | Payer: Medicaid Other | Attending: Emergency Medicine | Admitting: Emergency Medicine

## 2013-01-26 DIAGNOSIS — S98139A Complete traumatic amputation of one unspecified lesser toe, initial encounter: Secondary | ICD-10-CM | POA: Insufficient documentation

## 2013-01-26 DIAGNOSIS — T839XXA Unspecified complication of genitourinary prosthetic device, implant and graft, initial encounter: Secondary | ICD-10-CM

## 2013-01-26 DIAGNOSIS — Z794 Long term (current) use of insulin: Secondary | ICD-10-CM | POA: Insufficient documentation

## 2013-01-26 DIAGNOSIS — E119 Type 2 diabetes mellitus without complications: Secondary | ICD-10-CM | POA: Insufficient documentation

## 2013-01-26 DIAGNOSIS — H579 Unspecified disorder of eye and adnexa: Secondary | ICD-10-CM | POA: Insufficient documentation

## 2013-01-26 DIAGNOSIS — T8389XA Other specified complication of genitourinary prosthetic devices, implants and grafts, initial encounter: Secondary | ICD-10-CM | POA: Insufficient documentation

## 2013-01-26 DIAGNOSIS — Z7982 Long term (current) use of aspirin: Secondary | ICD-10-CM | POA: Insufficient documentation

## 2013-01-26 DIAGNOSIS — H5710 Ocular pain, unspecified eye: Secondary | ICD-10-CM | POA: Insufficient documentation

## 2013-01-26 DIAGNOSIS — H5789 Other specified disorders of eye and adnexa: Secondary | ICD-10-CM

## 2013-01-26 DIAGNOSIS — Z79899 Other long term (current) drug therapy: Secondary | ICD-10-CM | POA: Insufficient documentation

## 2013-01-26 DIAGNOSIS — S88119A Complete traumatic amputation at level between knee and ankle, unspecified lower leg, initial encounter: Secondary | ICD-10-CM | POA: Insufficient documentation

## 2013-01-26 DIAGNOSIS — Z8659 Personal history of other mental and behavioral disorders: Secondary | ICD-10-CM | POA: Insufficient documentation

## 2013-01-26 DIAGNOSIS — N189 Chronic kidney disease, unspecified: Secondary | ICD-10-CM | POA: Insufficient documentation

## 2013-01-26 DIAGNOSIS — Y846 Urinary catheterization as the cause of abnormal reaction of the patient, or of later complication, without mention of misadventure at the time of the procedure: Secondary | ICD-10-CM | POA: Insufficient documentation

## 2013-01-26 DIAGNOSIS — I129 Hypertensive chronic kidney disease with stage 1 through stage 4 chronic kidney disease, or unspecified chronic kidney disease: Secondary | ICD-10-CM | POA: Insufficient documentation

## 2013-01-26 MED ORDER — FLUORESCEIN SODIUM 1 MG OP STRP
1.0000 | ORAL_STRIP | Freq: Once | OPHTHALMIC | Status: DC
  Filled 2013-01-26: qty 1

## 2013-01-26 MED ORDER — TETRACAINE HCL 0.5 % OP SOLN
1.0000 [drp] | Freq: Once | OPHTHALMIC | Status: DC
  Filled 2013-01-26: qty 2

## 2013-01-26 NOTE — ED Provider Notes (Signed)
CSN: [id number]     Arrival date & time 01/26/13  2056 History  This chart was scribed for Ruby Cola, PA-C, working with Loren Racer, MD by Blanchard Kelch, ED Scribe. This patient was seen in room WTR6/WTR6 and the patient's care was started at 11:19 PM.    Chief Complaint  Patient presents with  . Illegal value: [    catheter bag leaking    The history is provided by the patient. No language interpreter was used.    HPI Comments: Lawrence [name] is a 52 y.o. male brought in by ambulance with a history of a permanent catheter who presents to the Emergency Department complaining of a leak in his catheter two hours ago. He states that he emptied his catheter prior to getting ready for bed and did not noticed any wet area on his pants or groin. However, when he went to bed he noticed the bed was wet. He tightened the switched on the urine bag on his leg after noticing the urine leak, which he believes alleviated the problem. He has not noticed any leaking since arriving at the ED. His catheter was last changed about ten days ago.   He is also complaining of an unchanged foreign body sensation in his left eye that began three days ago. He has not been outside lately and does not remember getting anything in his eye. He has mild pain to the eye but denies blurred, double or decreased vision. He describes the sensation as itching. He denies any eye discharge or fever.  Past Medical History  Diagnosis Date  . Hypertension   . Chronic kidney disease   . Hypercholesteremia   . Diabetes mellitus     insulin dependent  . Depression   . PONV (postoperative nausea and vomiting)    Past Surgical History  Procedure Laterality Date  . Amputation Right 05/03/2012    Procedure: Right First Ray AMPUTATION;  Surgeon: Eldred Manges, MD;  Location: [facility] OR;  Service: Orthopedics;  Laterality: Right;  . I&d extremity Right 05/29/2012    Procedure: IRRIGATION AND DEBRIDEMENT EXTREMITY- right  foot;  Surgeon: Eldred Manges, MD;  Location: [facility] OR;  Service: Orthopedics;  Laterality: Right;  Right Foot Debridement, VAC Application  . Toe amputation Right 05/29/2012    1ST  ray      Dr Ophelia Charter  . Amputation Right 08/16/2012    Procedure: AMPUTATION BELOW KNEE;  Surgeon: Eldred Manges, MD;  Location: [facility] OR;  Service: Orthopedics;  Laterality: Right;  Right Below Knee Amputation  . Amputation Right 10/04/2012    Procedure: AMPUTATION BELOW KNEE REVISION;  Surgeon: Eldred Manges, MD;  Location: [facility] OR;  Service: Orthopedics;  Laterality: Right;   History reviewed. No pertinent family history. History  Substance Use Topics  . Smoking status: Never Smoker   . Smokeless tobacco: Never Used  . Alcohol Use: Yes     Comment: rarely    Review of Systems  Eyes: Positive for pain and itching. Negative for visual disturbance.  All other systems reviewed and are negative.    Allergies  Review of patient's allergies indicates no known allergies.  Home Medications   Current Outpatient Rx  Name  Route  Sig  Dispense  Refill  . aspirin 81 MG tablet   Oral   Take 81 mg by mouth every morning.          Marland Kitchen HYDROcodone-acetaminophen (NORCO/VICODIN) 5-325 MG per tablet   Oral   Take  1 tablet by mouth every 6 (six) hours as needed for moderate pain.         Marland Kitchen insulin glargine (LANTUS) 100 UNIT/ML injection   Subcutaneous   Inject 0.2 mLs (20 Units total) into the skin at bedtime.   10 mL   6   . metoprolol tartrate (LOPRESSOR) 25 MG tablet   Oral   Take 25 mg by mouth every evening.           Triage Vitals: BP 161/85  Pulse 78  Temp(Src) 99 F (37.2 C) (Oral)  Resp 18  SpO2 100%  Physical Exam  Nursing note and vitals reviewed. Constitutional: He is oriented to person, place, and time. He appears well-developed and well-nourished. No distress.  HENT:  Head: Normocephalic and atraumatic.  Eyes: EOM are normal. Pupils are equal, round, and reactive to light. Right eye exhibits no  discharge. Left eye exhibits no discharge.  There is what appears to be pingueculae on both lateral and medial aspect of cornea on both eyes.  No conjunctival injection.  Eye stained w/ fluorescein and no visible corneal abrasion.   Neck: Normal range of motion.  Pulmonary/Chest: Effort normal.  Musculoskeletal: Normal range of motion.  Neurological: He is alert and oriented to person, place, and time.  Psychiatric: He has a normal mood and affect. His behavior is normal.    ED Course  Procedures (including critical care time)  DIAGNOSTIC STUDIES: Oxygen Saturation is 100% on room air, normal by my interpretation.    COORDINATION OF CARE: 11:24 PM - Patient verbalizes understanding and agrees with treatment plan.    Labs Review Labs Reviewed - No data to display Imaging Review No results found.  EKG Interpretation   None       MDM   1. Urinary catheter complication, initial encounter   2. Irritation of left eye    52yo diabetic M w/ chronic indwelling catheter, presents w/ multiple complaints.  Believes his leg bag was leaking this evening b/c bed sheets were wet, but tightened the drain cap and has not noticed any drainage since.  Also c/o mild, non-traumatic, foreign body sensation and pruritis L eye x 2 days.  No associated sx, including vision changes.  Exam unremarkable w/ exception of what I believe is pinguecula both medial and lateral to cornea bilaterally.  This may be cause of sx.  No corneal abrasion or obvious infectious process.  Recommended artificial tears and f/u w/ his ophthalmologist this week.    I personally performed the services described in this documentation, which was scribed in my presence. The recorded information has been reviewed and is accurate.    Otilio Miu, PA-C 01/27/13 [phone]

## 2013-01-26 NOTE — ED Notes (Signed)
Patient reports that his catheter bag is leaking and needs a new one

## 2013-01-27 NOTE — ED Provider Notes (Signed)
Medical screening examination/treatment/procedure(s) were performed by non-physician practitioner and as supervising physician I was immediately available for consultation/collaboration.  EKG Interpretation   None         William Bucky Grigg, MD 01/27/13 2314 

## 2013-01-27 NOTE — ED Notes (Signed)
[facility] here to take pt back to residence.  

## 2013-01-27 NOTE — ED Notes (Signed)
[facility] called to take pt back to residence.  

## 2013-02-02 ENCOUNTER — Emergency Department (HOSPITAL_COMMUNITY)
Admission: EM | Admit: 2013-02-02 | Discharge: 2013-02-02 | Disposition: A | Discharge status: Home / Self Care | Payer: Medicaid Other | Attending: Emergency Medicine | Admitting: Emergency Medicine

## 2013-02-02 ENCOUNTER — Encounter (HOSPITAL_COMMUNITY): Payer: Self-pay | Admitting: Emergency Medicine

## 2013-02-02 DIAGNOSIS — E119 Type 2 diabetes mellitus without complications: Secondary | ICD-10-CM | POA: Insufficient documentation

## 2013-02-02 DIAGNOSIS — T839XXD Unspecified complication of genitourinary prosthetic device, implant and graft, subsequent encounter: Secondary | ICD-10-CM

## 2013-02-02 DIAGNOSIS — Z794 Long term (current) use of insulin: Secondary | ICD-10-CM | POA: Insufficient documentation

## 2013-02-02 DIAGNOSIS — N189 Chronic kidney disease, unspecified: Secondary | ICD-10-CM | POA: Insufficient documentation

## 2013-02-02 DIAGNOSIS — Y838 Other surgical procedures as the cause of abnormal reaction of the patient, or of later complication, without mention of misadventure at the time of the procedure: Secondary | ICD-10-CM | POA: Insufficient documentation

## 2013-02-02 DIAGNOSIS — Z7982 Long term (current) use of aspirin: Secondary | ICD-10-CM | POA: Insufficient documentation

## 2013-02-02 DIAGNOSIS — Z8639 Personal history of other endocrine, nutritional and metabolic disease: Secondary | ICD-10-CM | POA: Insufficient documentation

## 2013-02-02 DIAGNOSIS — Z862 Personal history of diseases of the blood and blood-forming organs and certain disorders involving the immune mechanism: Secondary | ICD-10-CM | POA: Insufficient documentation

## 2013-02-02 DIAGNOSIS — I129 Hypertensive chronic kidney disease with stage 1 through stage 4 chronic kidney disease, or unspecified chronic kidney disease: Secondary | ICD-10-CM | POA: Insufficient documentation

## 2013-02-02 DIAGNOSIS — Z79899 Other long term (current) drug therapy: Secondary | ICD-10-CM | POA: Insufficient documentation

## 2013-02-02 DIAGNOSIS — T83091A Other mechanical complication of indwelling urethral catheter, initial encounter: Secondary | ICD-10-CM | POA: Insufficient documentation

## 2013-02-02 NOTE — ED Provider Notes (Signed)
CSN: [id number]     Arrival date & time 02/02/13  0602 History   First MD Initiated Contact with Patient 02/02/13 [phone]     Chief Complaint  Patient presents with  . Needs new foley bag    (Consider location/radiation/quality/duration/timing/severity/associated sxs/prior Treatment) HPI Lawrence [name] is a 52 y.o.male with a significant PMH of hypertension, chronic kidney disease, diabetes, depression, and permanent foley catheter presents to the ER with complaints of foley bag leaking. HE says this is a normal problem that happens every two weeks like clock work. He tells me that he was told by one of the physicians not to come back here to have his foley bag replaced but he does not have home health and has no other options.    Past Medical History  Diagnosis Date  . Hypertension   . Chronic kidney disease   . Hypercholesteremia   . Diabetes mellitus     insulin dependent  . Depression   . PONV (postoperative nausea and vomiting)    Past Surgical History  Procedure Laterality Date  . Amputation Right 05/03/2012    Procedure: Right First Ray AMPUTATION;  Surgeon: Eldred Manges, MD;  Location: [facility] OR;  Service: Orthopedics;  Laterality: Right;  . I&d extremity Right 05/29/2012    Procedure: IRRIGATION AND DEBRIDEMENT EXTREMITY- right foot;  Surgeon: Eldred Manges, MD;  Location: [facility] OR;  Service: Orthopedics;  Laterality: Right;  Right Foot Debridement, VAC Application  . Toe amputation Right 05/29/2012    1ST  ray      Dr Ophelia Charter  . Amputation Right 08/16/2012    Procedure: AMPUTATION BELOW KNEE;  Surgeon: Eldred Manges, MD;  Location: [facility] OR;  Service: Orthopedics;  Laterality: Right;  Right Below Knee Amputation  . Amputation Right 10/04/2012    Procedure: AMPUTATION BELOW KNEE REVISION;  Surgeon: Eldred Manges, MD;  Location: [facility] OR;  Service: Orthopedics;  Laterality: Right;   History reviewed. No pertinent family history. History  Substance Use Topics  . Smoking status: Never Smoker    . Smokeless tobacco: Never Used  . Alcohol Use: Yes     Comment: rarely    Review of Systems The patient denies anorexia, fever, weight loss,, vision loss, decreased hearing, hoarseness, chest pain, syncope, dyspnea on exertion, peripheral edema, balance deficits, hemoptysis, abdominal pain, melena, hematochezia, severe indigestion/heartburn, hematuria, incontinence, genital sores, muscle weakness, suspicious skin lesions, transient blindness, difficulty walking, depression, unusual weight change, abnormal bleeding, enlarged lymph nodes, angioedema, and breast masses.  Allergies  Review of patient's allergies indicates no known allergies.  Home Medications   Current Outpatient Rx  Name  Route  Sig  Dispense  Refill  . aspirin 81 MG tablet   Oral   Take 81 mg by mouth every morning.          Marland Kitchen HYDROcodone-acetaminophen (NORCO/VICODIN) 5-325 MG per tablet   Oral   Take 1 tablet by mouth every 6 (six) hours as needed for moderate pain.         Marland Kitchen insulin glargine (LANTUS) 100 UNIT/ML injection   Subcutaneous   Inject 0.2 mLs (20 Units total) into the skin at bedtime.   10 mL   6   . metoprolol tartrate (LOPRESSOR) 25 MG tablet   Oral   Take 25 mg by mouth every evening.           BP 162/61  Pulse 77  Temp(Src) 98.4 F (36.9 C) (Oral)  Resp 20  SpO2 99%  Physical Exam  Nursing note and vitals reviewed. Constitutional: He appears well-developed and well-nourished. No distress.  HENT:  Head: Normocephalic and atraumatic.  Eyes: Pupils are equal, round, and reactive to light.  Neck: Normal range of motion. Neck supple.  Cardiovascular: Normal rate and regular rhythm.   Pulmonary/Chest: Effort normal.  Abdominal: Soft.  Neurological: He is alert.  Skin: Skin is warm and dry.    ED Course  Procedures (including critical care time) Labs Review Labs Reviewed - No data to display Imaging Review No results found.  EKG Interpretation   None       MDM   1.  Complication of Foley catheter, subsequent encounter    Foley bag replaced by nurse. Pt has no more complaints. I advised patient to speak with his PCP about getting home health.  52 y.o.Lawrence [name]'s evaluation in the Emergency Department is complete. It has been determined that no acute conditions requiring further emergency intervention are present at this time. The patient/guardian have been advised of the diagnosis and plan. We have discussed signs and symptoms that warrant return to the ED, such as changes or worsening in symptoms.  Vital signs are stable at discharge. Filed Vitals:   02/02/13 0611  BP: 162/61  Pulse: 77  Temp: 98.4 F (36.9 C)  Resp: 20    Patient/guardian has voiced understanding and agreed to follow-up with the PCP or specialist.     Dorthula Matas, PA-C 02/02/13 [phone]

## 2013-02-02 NOTE — ED Notes (Signed)
[facility] called for transport.  

## 2013-02-02 NOTE — ED Notes (Signed)
Per EMS: pt coming from home with c/o foley bag malfunction. Pt needs a new foley bag. No other complaints.

## 2013-02-02 NOTE — ED Notes (Signed)
PA-C at bedside 

## 2013-02-02 NOTE — ED Provider Notes (Signed)
Medical screening examination/treatment/procedure(s) were performed by non-physician practitioner and as supervising physician I was immediately available for consultation/collaboration.    Olivia [name], MD 02/02/13 [phone]

## 2013-02-19 ENCOUNTER — Other Ambulatory Visit: Payer: Self-pay | Admitting: Urology

## 2013-02-21 ENCOUNTER — Other Ambulatory Visit: Payer: Self-pay | Admitting: Urology

## 2013-02-21 ENCOUNTER — Encounter (HOSPITAL_COMMUNITY): Payer: Self-pay | Admitting: Pharmacy Technician

## 2013-02-21 NOTE — Progress Notes (Signed)
Surgery scheduled for 03/02/13.  Need orders in EPIC.  Thank You.  

## 2013-02-22 ENCOUNTER — Encounter (HOSPITAL_COMMUNITY): Payer: Self-pay | Admitting: *Deleted

## 2013-02-23 ENCOUNTER — Telehealth: Payer: Self-pay | Admitting: Family Medicine

## 2013-02-23 NOTE — Progress Notes (Signed)
Need orders in EPIC.  Surgery scheduled for 03/02/13.  Thank You.  

## 2013-02-23 NOTE — Telephone Encounter (Signed)
Pt called because he is low on insulin and wanted to know if Dr. Mauricio PoBreen had any samples available> he does not have insurance and also no money. jw

## 2013-02-26 NOTE — Telephone Encounter (Signed)
Pt is still waiting on answer about Medicaid. Only has enough to last thru tomorrow

## 2013-02-26 NOTE — Telephone Encounter (Signed)
Will fwd to MD since last OV was 06/2012.  Storm Sovine, Darlyne RussianKristen L, CMA

## 2013-02-27 MED ORDER — INSULIN GLARGINE 100 UNIT/ML ~~LOC~~ SOLN: SUBCUTANEOUS | Status: DC

## 2013-02-27 NOTE — Telephone Encounter (Signed)
Last seen in May, 2014.  Needs follow up visit in office for DM and to discuss refills.   JB

## 2013-02-27 NOTE — Telephone Encounter (Signed)
Called patient and told him he needs an office visit for refills. Mr Lawrence [name] says he is a below the knee amputee and can only come in for an office visit by ambulance which will cost him $1,000, he is unable to travel by car. He is requesting that Dr Mauricio PoBreen gives him a sample of insulin that he will have his sister pick up for him. I told him I would forward this message to Dr Mauricio PoBreen and let him know something later in the day.Busick, Rodena Medinobert Lee

## 2013-02-27 NOTE — Telephone Encounter (Signed)
It will be difficult for Mr Lawrence [name] and I to maintain a therapeutic relationship if he is unable to physically come to the office due to his amputations.  I am fine with giving sample Lantus if it is available.  I told his sister myself that the availability of samples is not guaranteed, and that it is reserved for patients without insurance or Medicaid. JB

## 2013-02-27 NOTE — Telephone Encounter (Signed)
Called pt. Put 2 bottles of Lantus up front for pick up. Pt is very happy about this. He reports, that he will receive Medicaid, has a approval letter already. Lorenda Hatchet.Cinthia Rodden, Renato Battleshekla

## 2013-03-01 NOTE — Progress Notes (Signed)
Antonietta BreachPat Chapman, RN [facility] that patient had no one to stay with him for 24 hours at home after surgery and that patient informed Antonietta BreachPat Chapman, RN that he was not spending the night as he was coming by EMS to hospital am of surgery. Selita called back and informed me that she left messages for Dr. Brunilda PayorNesi and has not heard back from him and she cannot do anything else. Called patient to inform him that his surgery may be cancelled if he does not have a responsible person to stay with him for 24 hours after surgery and he informed me that why was I doing this to him and that he was not happy about this. He informed me that his sister would come in am with him and stay with him.

## 2013-03-02 ENCOUNTER — Ambulatory Visit (HOSPITAL_COMMUNITY): Payer: Medicaid Other | Admitting: Anesthesiology

## 2013-03-02 ENCOUNTER — Encounter (HOSPITAL_COMMUNITY): Payer: Self-pay | Admitting: *Deleted

## 2013-03-02 ENCOUNTER — Encounter (HOSPITAL_COMMUNITY)
Admission: RE | Disposition: A | Discharge status: Home / Self Care | Payer: Self-pay | Source: Ambulatory Visit | Attending: Urology

## 2013-03-02 ENCOUNTER — Ambulatory Visit (HOSPITAL_COMMUNITY)
Admission: RE | Admit: 2013-03-02 | Discharge: 2013-03-02 | Disposition: A | Discharge status: Home / Self Care | Payer: Medicaid Other | Source: Ambulatory Visit | Attending: Urology | Admitting: Urology

## 2013-03-02 ENCOUNTER — Telehealth: Payer: Self-pay | Admitting: *Deleted

## 2013-03-02 ENCOUNTER — Telehealth: Payer: Self-pay | Admitting: Family Medicine

## 2013-03-02 ENCOUNTER — Encounter (HOSPITAL_COMMUNITY): Payer: Medicaid Other | Admitting: Anesthesiology

## 2013-03-02 DIAGNOSIS — R339 Retention of urine, unspecified: Secondary | ICD-10-CM | POA: Insufficient documentation

## 2013-03-02 DIAGNOSIS — Z7982 Long term (current) use of aspirin: Secondary | ICD-10-CM | POA: Insufficient documentation

## 2013-03-02 DIAGNOSIS — E785 Hyperlipidemia, unspecified: Secondary | ICD-10-CM | POA: Insufficient documentation

## 2013-03-02 DIAGNOSIS — N400 Enlarged prostate without lower urinary tract symptoms: Secondary | ICD-10-CM | POA: Insufficient documentation

## 2013-03-02 DIAGNOSIS — Z87440 Personal history of urinary (tract) infections: Secondary | ICD-10-CM | POA: Insufficient documentation

## 2013-03-02 DIAGNOSIS — Z794 Long term (current) use of insulin: Secondary | ICD-10-CM | POA: Insufficient documentation

## 2013-03-02 DIAGNOSIS — Z79899 Other long term (current) drug therapy: Secondary | ICD-10-CM | POA: Insufficient documentation

## 2013-03-02 DIAGNOSIS — I10 Essential (primary) hypertension: Secondary | ICD-10-CM | POA: Insufficient documentation

## 2013-03-02 DIAGNOSIS — I739 Peripheral vascular disease, unspecified: Secondary | ICD-10-CM | POA: Insufficient documentation

## 2013-03-02 DIAGNOSIS — S88119A Complete traumatic amputation at level between knee and ankle, unspecified lower leg, initial encounter: Secondary | ICD-10-CM | POA: Insufficient documentation

## 2013-03-02 DIAGNOSIS — E119 Type 2 diabetes mellitus without complications: Secondary | ICD-10-CM | POA: Insufficient documentation

## 2013-03-02 HISTORY — DX: Peripheral vascular disease, unspecified: I73.9

## 2013-03-02 HISTORY — DX: Personal history of other diseases of the digestive system: Z87.19

## 2013-03-02 HISTORY — PX: INSERTION OF SUPRAPUBIC CATHETER: SHX5870

## 2013-03-02 LAB — CBC
HCT: 29.3 % — ABNORMAL LOW (ref 39.0–52.0)
Hemoglobin: 9.8 g/dL — ABNORMAL LOW (ref 13.0–17.0)
MCH: 27.7 pg (ref 26.0–34.0)
MCHC: 33.4 g/dL (ref 30.0–36.0)
MCV: 82.8 fL (ref 78.0–100.0)
PLATELETS: 134 10*3/uL — AB (ref 150–400)
RBC: 3.54 MIL/uL — AB (ref 4.22–5.81)
RDW: 13.8 % (ref 11.5–15.5)
WBC: 5.3 10*3/uL (ref 4.0–10.5)

## 2013-03-02 LAB — BASIC METABOLIC PANEL
BUN: 29 mg/dL — ABNORMAL HIGH (ref 6–23)
CHLORIDE: 105 meq/L (ref 96–112)
CO2: 26 mEq/L (ref 19–32)
Calcium: 8.8 mg/dL (ref 8.4–10.5)
Creatinine, Ser: 1.63 mg/dL — ABNORMAL HIGH (ref 0.50–1.35)
GFR calc Af Amer: 54 mL/min — ABNORMAL LOW (ref >90)
GFR calc non Af Amer: 47 mL/min — ABNORMAL LOW (ref >90)
Glucose, Bld: 112 mg/dL — ABNORMAL HIGH (ref 70–99)
POTASSIUM: 4.7 meq/L (ref 3.7–5.3)
Sodium: 140 mEq/L (ref 137–147)

## 2013-03-02 LAB — GLUCOSE, CAPILLARY
GLUCOSE-CAPILLARY: 101 mg/dL — AB (ref 70–99)
Glucose-Capillary: 87 mg/dL (ref 70–99)
Glucose-Capillary: 77 mg/dL (ref 70–99)

## 2013-03-02 SURGERY — INSERTION, SUPRAPUBIC CATHETER
Anesthesia: General | Site: Abdomen

## 2013-03-02 MED ORDER — MIDAZOLAM HCL 2 MG/2ML IJ SOLN
INTRAMUSCULAR | Status: AC
  Filled 2013-03-02: qty 2

## 2013-03-02 MED ORDER — 0.9 % SODIUM CHLORIDE (POUR BTL) OPTIME
TOPICAL | Status: DC | PRN
  Administered 2013-03-02: 1000 mL

## 2013-03-02 MED ORDER — LACTATED RINGERS IV SOLN: INTRAVENOUS | Status: DC

## 2013-03-02 MED ORDER — HYDROCODONE-ACETAMINOPHEN 5-325 MG PO TABS
1.0000 | ORAL_TABLET | Freq: Once | ORAL | Status: AC
  Administered 2013-03-02: 1 via ORAL
  Filled 2013-03-02: qty 1

## 2013-03-02 MED ORDER — MEPERIDINE HCL 50 MG/ML IJ SOLN: 6.2500 mg | INTRAMUSCULAR | Status: DC | PRN

## 2013-03-02 MED ORDER — LIDOCAINE HCL (CARDIAC) 20 MG/ML IV SOLN
INTRAVENOUS | Status: AC
  Filled 2013-03-02: qty 5

## 2013-03-02 MED ORDER — FENTANYL CITRATE 0.05 MG/ML IJ SOLN
INTRAMUSCULAR | Status: AC
  Filled 2013-03-02: qty 2

## 2013-03-02 MED ORDER — SODIUM CHLORIDE 0.9 % IR SOLN
Status: DC | PRN
  Administered 2013-03-02: 3000 mL

## 2013-03-02 MED ORDER — LIDOCAINE HCL (CARDIAC) 20 MG/ML IV SOLN
INTRAVENOUS | Status: DC | PRN
  Administered 2013-03-02: 60 mg via INTRAVENOUS

## 2013-03-02 MED ORDER — FENTANYL CITRATE 0.05 MG/ML IJ SOLN
INTRAMUSCULAR | Status: DC | PRN
  Administered 2013-03-02 (×2): 25 ug via INTRAVENOUS
  Administered 2013-03-02: 50 ug via INTRAVENOUS

## 2013-03-02 MED ORDER — LACTATED RINGERS IV SOLN
INTRAVENOUS | Status: DC
  Administered 2013-03-02: 1000 mL via INTRAVENOUS

## 2013-03-02 MED ORDER — HYDROCODONE-ACETAMINOPHEN 5-325 MG PO TABS: 1.0000 | ORAL_TABLET | Freq: Four times a day (QID) | ORAL | Status: DC | PRN

## 2013-03-02 MED ORDER — ONDANSETRON HCL 4 MG/2ML IJ SOLN
INTRAMUSCULAR | Status: AC
  Filled 2013-03-02: qty 2

## 2013-03-02 MED ORDER — LACTATED RINGERS IV SOLN
INTRAVENOUS | Status: DC | PRN
  Administered 2013-03-02: 11:00:00 via INTRAVENOUS

## 2013-03-02 MED ORDER — CEFAZOLIN SODIUM-DEXTROSE 2-3 GM-% IV SOLR
2.0000 g | INTRAVENOUS | Status: AC
  Administered 2013-03-02: 2 g via INTRAVENOUS

## 2013-03-02 MED ORDER — MIDAZOLAM HCL 5 MG/5ML IJ SOLN
INTRAMUSCULAR | Status: DC | PRN
  Administered 2013-03-02: 2 mg via INTRAVENOUS

## 2013-03-02 MED ORDER — ONDANSETRON HCL 4 MG/2ML IJ SOLN
INTRAMUSCULAR | Status: DC | PRN
  Administered 2013-03-02: 4 mg via INTRAVENOUS

## 2013-03-02 MED ORDER — PROPOFOL 10 MG/ML IV BOLUS
INTRAVENOUS | Status: AC
  Filled 2013-03-02: qty 20

## 2013-03-02 MED ORDER — FENTANYL CITRATE 0.05 MG/ML IJ SOLN
25.0000 ug | INTRAMUSCULAR | Status: DC | PRN
  Administered 2013-03-02 (×2): 25 ug via INTRAVENOUS

## 2013-03-02 MED ORDER — PROMETHAZINE HCL 25 MG/ML IJ SOLN: 6.2500 mg | INTRAMUSCULAR | Status: DC | PRN

## 2013-03-02 MED ORDER — CEFAZOLIN SODIUM-DEXTROSE 2-3 GM-% IV SOLR
INTRAVENOUS | Status: AC
  Filled 2013-03-02: qty 50

## 2013-03-02 MED ORDER — PROPOFOL 10 MG/ML IV BOLUS
INTRAVENOUS | Status: DC | PRN
  Administered 2013-03-02: 150 mg via INTRAVENOUS
  Administered 2013-03-02: 30 mg via INTRAVENOUS

## 2013-03-02 SURGICAL SUPPLY — 24 items
BAG URINE DRAINAGE (UROLOGICAL SUPPLIES) ×3 IMPLANT
BAG URINE LEG 500ML (DRAIN) ×3 IMPLANT
BLADE SURG 15 STRL LF DISP TIS (BLADE) ×1 IMPLANT
BLADE SURG 15 STRL SS (BLADE) ×3
CATH SILASTIC FOLEY 18FRX5CC (CATHETERS) ×3 IMPLANT
CATH URET 5FR 28IN OPEN ENDED (CATHETERS) ×3 IMPLANT
COVER SURGICAL LIGHT HANDLE (MISCELLANEOUS) ×6 IMPLANT
ELECT REM PT RETURN 9FT ADLT (ELECTROSURGICAL) ×3
ELECTRODE REM PT RTRN 9FT ADLT (ELECTROSURGICAL) ×1 IMPLANT
GLOVE SURG SS PI 8.0 STRL IVOR (GLOVE) ×3 IMPLANT
GOWN STRL REUS W/TWL XL LVL3 (GOWN DISPOSABLE) ×6 IMPLANT
KIT SUPRAPUBIC CATH (MISCELLANEOUS) ×3 IMPLANT
MANIFOLD NEPTUNE II (INSTRUMENTS) ×3 IMPLANT
NEEDLE HYPO 22GX1.5 SAFETY (NEEDLE) ×3 IMPLANT
NS IRRIG 1000ML POUR BTL (IV SOLUTION) ×3 IMPLANT
PACK CYSTO (CUSTOM PROCEDURE TRAY) ×3 IMPLANT
PENCIL BUTTON HOLSTER BLD 10FT (ELECTRODE) IMPLANT
PLUG CATH AND CAP STER (CATHETERS) ×3 IMPLANT
SPONGE GAUZE 4X4 12PLY (GAUZE/BANDAGES/DRESSINGS) ×3 IMPLANT
SUT ETHILON 2 0 PS N (SUTURE) ×3 IMPLANT
SUT ETHILON 3 0 FSL (SUTURE) ×3 IMPLANT
TAPE CLOTH SURG 4X10 WHT LF (GAUZE/BANDAGES/DRESSINGS) ×3 IMPLANT
TOWEL OR 17X26 10 PK STRL BLUE (TOWEL DISPOSABLE) ×3 IMPLANT
WATER STERILE IRR 3000ML UROMA (IV SOLUTION) ×3 IMPLANT

## 2013-03-02 NOTE — H&P (Signed)
History of Present Illness Lawrence [name] has an indwelling Foley catheter and failed several voiding trials. He states that he is interested in S/P tube. The catheter was replaced today. Will schedule him for S/P tube placement.   Lawrence [name] failed another voiding trial today. I discussed the management options with him: indwelling Foley versus self intermittent catheterization versus S/P tube. He does not think he can do self catheterization. He would like to have the Foley for another month and he will think about S/P tube.   Past Medical History Problems  1. History of diabetes mellitus (V12.29) 2. History of hyperlipidemia (V12.29) 3. History of hypertension (V12.59) 4. History of Microscopic hematuria (599.72) 5. History of Osteomyelitis (015) 6. History of Urinary Tract Infection (V13.02)  Surgical History Problems  1. History of Amputation Of Leg Below Knee  Current Meds 1. Aspirin 325 MG Oral Tablet;  Therapy: (Recorded:02Sep2014) to Recorded 2. Ciprofloxacin HCl - 500 MG Oral Tablet; TAKE 1 TABLET BID STARTING THE DAY  BEFORE PROCEDURE;  Therapy: 22Oct2014 to (Last Rx:22Oct2014) Ordered 3. Dulcolax 10 MG Rectal Suppository;  Therapy: (Recorded:12Aug2014) to Recorded 4. Flomax 0.4 MG Oral Capsule;  Therapy: (Recorded:12Aug2014) to Recorded 5. Gabapentin 100 MG Oral Capsule;  Therapy: (Recorded:12Aug2014) to Recorded 6. GlipiZIDE ER 10 MG Oral Tablet Extended Release 24 Hour;  Therapy: (Recorded:21Mar2013) to Recorded 7. Lantus SOLN;  Therapy: (Recorded:21Mar2013) to Recorded 8. Lisinopril TABS;  Therapy: (Recorded:21Mar2013) to Recorded 9. Maalox Max SUSP;  Therapy: (Recorded:12Aug2014) to Recorded 10. MetFORMIN HCl - 850 MG Oral Tablet;   Therapy: (Recorded:21Mar2013) to Recorded 11. Metoprolol Tartrate 25 MG Oral Tablet;   Therapy: (Recorded:12Aug2014) to Recorded 12. MiraLax POWD;   Therapy: (Recorded:12Aug2014) to Recorded 13. Oxycodone-Acetaminophen 5-325 MG Oral  Tablet;   Therapy: (Recorded:12Aug2014) to Recorded 14. Robaxin 500 MG Oral Tablet;   Therapy: (Recorded:12Aug2014) to Recorded 15. Tylenol 325 MG Oral Tablet;   Therapy: (Recorded:12Aug2014) to Recorded  Allergies Medication  1. No Known Drug Allergies  Family History Problems  1. Family history of Father Deceased At Age ____ 2. Family history of Mother Deceased At Age ____ 3. No pertinent family history : Mother  Social History Problems  1. Alcohol Use   2-3 a month 2. Caffeine Use   2-3 a day 3. Marital History - Single 4. Never A Smoker  Review of Systems Genitourinary, constitutional, skin, eye, otolaryngeal, hematologic/lymphatic, cardiovascular, pulmonary, endocrine, musculoskeletal, gastrointestinal, neurological and psychiatric system(s) were reviewed and pertinent findings if present are noted. He is unable to void. He is S/P right BKA.    Physical Exam Constitutional: Well nourished and well developed . No acute distress.  ENT:. The ears and nose are normal in appearance.  Neck: The appearance of the neck is normal and no neck mass is present.  Pulmonary: No respiratory distress and normal respiratory rhythm and effort.  Cardiovascular: Heart rate and rhythm are normal . No peripheral edema.  Abdomen: The abdomen is soft and nontender. No masses are palpated. No CVA tenderness. No hernias are palpable. No hepatosplenomegaly noted.  Rectal: Rectal exam demonstrates normal sphincter tone, no tenderness and no masses. The prostate has no nodularity and is not tender. The left seminal vesicle is nonpalpable. The right seminal vesicle is nonpalpable. The perineum is normal on inspection.  Genitourinary: Examination of the penis demonstrates no discharge, no masses, no lesions and a normal meatus. The scrotum is without lesions. The right epididymis is palpably normal and non-tender. The left epididymis is palpably normal and non-tender. The right  testis is non-tender and  without masses. The left testis is non-tender and without masses.  Lymphatics: The femoral and inguinal nodes are not enlarged or tender.  Skin: Normal skin turgor, no visible rash and no visible skin lesions.  Neuro/Psych:. Mood and affect are appropriate. He is S/P right BKA.    Assessment Assessed  1. Urinary retention (788.20)  Plan Insertion of S/P tube. The procedure, risks, benefits were explained to the patient. the risks include but are not limited to hemorrhage, infection, injury to adjacent organs. He understands and wishes to proceed.

## 2013-03-02 NOTE — Preoperative (Signed)
Beta Blockers   Reason not to administer Beta Blockers:Metoprolol taken 03-01-13 at 2030

## 2013-03-02 NOTE — Progress Notes (Signed)
Received at call from Lawrence MaineMichael Nanny RN in PACU. States patient does not have a glucose meter or strips to test blood sugar. Spoke with patient at bedside, state meter broke some time ago and he has not been able to replace it. Patient states "if it cost money I can do it". Reviewed all available resources but all require some money for processing. Contacted Diabetes Coordinator to discuss and she agrees with options as they were presented to patient. Contacted Dr. Marinell BlightBreen's office to make them aware of the issue and they will try to obtain one through the Health Dept. They will contact the patient with an update once they have contacted the Health Dept. Patient aware of options and will await call from Dr. Marinell BlightBreen's office.

## 2013-03-02 NOTE — Anesthesia Preprocedure Evaluation (Addendum)
Anesthesia Evaluation  Patient identified by MRN, date of birth, ID band Patient awake    Reviewed: Allergy & Precautions, H&P , NPO status , Patient's Chart, lab work & pertinent test results  History of Anesthesia Complications (+) PONV  Airway Mallampati: II  Neck ROM: full    Dental   Pulmonary neg pulmonary ROS,          Cardiovascular hypertension, + Peripheral Vascular Disease     Neuro/Psych Depression negative neurological ROS  negative psych ROS   GI/Hepatic negative GI ROS, Neg liver ROS,   Endo/Other  diabetes, Poorly Controlled, Type 2, Insulin Dependent  Renal/GU Renal disease  negative genitourinary   Musculoskeletal negative musculoskeletal ROS (+)   Abdominal   Peds negative pediatric ROS (+)  Hematology negative hematology ROS (+)   Anesthesia Other Findings   Reproductive/Obstetrics negative OB ROS                         Anesthesia Physical Anesthesia Plan  ASA: III  Anesthesia Plan: General   Post-op Pain Management:    Induction: Intravenous  Airway Management Planned: LMA  Additional Equipment:   Intra-op Plan:   Post-operative Plan:   Informed Consent: I have reviewed the patients History and Physical, chart, labs and discussed the procedure including the risks, benefits and alternatives for the proposed anesthesia with the patient or authorized representative who has indicated his/her understanding and acceptance.   Dental advisory given  Plan Discussed with: CRNA  Anesthesia Plan Comments:         Anesthesia Quick Evaluation

## 2013-03-02 NOTE — Transfer of Care (Signed)
Immediate Anesthesia Transfer of Care Note  Patient: Lawrence [name]  Procedure(s) Performed: Procedure(s): INSERTION OF SUPRAPUBIC CATHETER (N/A)  Patient Location: PACU  Anesthesia Type:General  Level of Consciousness: awake, alert  and oriented  Airway & Oxygen Therapy: Patient Spontanous Breathing and Patient connected to face mask oxygen  Post-op Assessment: Report given to PACU RN and Post -op Vital signs reviewed and stable  Post vital signs: Reviewed and stable  Complications: No apparent anesthesia complications

## 2013-03-02 NOTE — Op Note (Signed)
Lawrence [name] is a 53 y.o.   03/02/2013  General  Pre-op diagnosis: Urinary retention  Postop diagnosis: Same  Procedure done: Cystoscopy, insertion of SP tube  Surgeon: Wendie SimmerMarc H. Keeara Frees  Anesthesia: General  Indication: Patient is a 53 years old male who had a right BK amputation. He states that he was voiding well for the procedure.  But since then he has been unable to void on his own. He failed several voiding trials.. Urodynamic studies showed hyposensitive bladder. The options were indwelling Foley catheter versus SP tube. He elected to have an SP tube. He is scheduled today for the procedure  Procedure: Patient was identified by his wrist band and proper timeout was taken.  Under general anesthesia he was prepped and draped and placed in the dorsolithotomy position. A panendoscope was inserted in the bladder. The anterior urethra is normal. There is moderate prostatic hypertrophy without obstruction of the bladder neck. The bladder mucosa is normal. There is no stone or tumor in the bladder. The ureteral orifices are in normal position and shape. The bladder was then fitted with about 400 cc of fluid. The cystoscope was removed. A Lowsley sound was passed in the bladder. The patient was then placed in deep Trendelenburg position. The tip of the sound was palpated in the suprapubic area. A stab wound incision was then made over the tip of the sound. The sound then passed through the incision. A #18 French Foley catheter was then passed between the jaws of the lousy sound. The catheter was then pulled through the bladder and the urethra. The cystoscope was then reinserted in the urethra and under direct vision the Foley was gradually pulled back in the bladder. The balloon of the Foley catheter was then inflated with 15 cc of water. The catheter was draining well. The cystoscope was then removed. A stab wound incision was then closed with #2-0 nylon and the Foley catheter was secured with the  #2-0 nylon. Sterile eye dressing was then applied to the wound.   EBL:  minimal   Needles, sponges count: Correct.  The patient tolerated the procedure well and left the OR in satisfactory condition to postanesthesia care unit.

## 2013-03-02 NOTE — Telephone Encounter (Signed)
Pt called after hours line Suprapubic catheter placed today (initial placement today) CBG on the way home from procedure was 70. Pt ate PB&J sandwich around 6:30 Pt no longer feeling shaky.  Pt has gone for days before w/o insulin Pt takes lantus 20 every night.   Pt instructed to hold lantus tonight and resume tomorrow if feeling and eating nml. Pt to cal lagain or go to ED if becomes shaky or ill feeling.   Shelly Flattenavid Merrell, MD Family Medicine PGY-3 03/02/2013, 8:49 PM

## 2013-03-02 NOTE — Telephone Encounter (Signed)
[facility] for procedure with Dr. Brunilda PayorNesi and will be discharged today.  Does not have a glucometer or strips.  [facility] has "exhausted" all resources since patient is not able to afford anything out of pocket.  CBG was 88 during visit.  [facility] wants to know if we have any resources to assist patient with getting a meter with strips based on his limited ability to pay.  No insurance or orange card coverage noted in Epic.  Will route note to PCP and Dr. Raymondo BandKoval (pharmacy) for advice and will call patient with info.

## 2013-03-02 NOTE — Discharge Instructions (Signed)
Suprapubic Catheter Replacement   Care After   Refer to this sheet in the next few weeks. These instructions provide you with information on caring for yourself after changing your catheter. Your caregiver may also give you more specific instructions. Call your caregiver if you have any problems or questions after you change your catheter.   HOME CARE INSTRUCTIONS   Take all medicines prescribed by your caregiver. Follow the directions carefully.   Drink 8 glasses of water every day. This produces good urine flow.   Check the skin around your catheter a few times every day. Watch for redness and swelling. Look for any fluids coming out of the opening.   Do not use powder or cream around the catheter opening.   Do not take tub baths or use pools or hot tubs.   Keep all follow-up appointments.  SEEK MEDICAL CARE IF:   You leak urine.   Your skin around the catheter becomes red or sore.   Your urine flow slows down.   Your urine gets cloudy or smelly.  SEEK IMMEDIATE MEDICAL CARE IF:   You have chills, nausea, or back pain.   You have trouble changing your catheter.   Your catheter comes out.   You have blood in your urine.   You have no urine flow for 1 hour.   You have a fever.  Document Released: 10/13/2010 Document Revised: 04/19/2011 Document Reviewed: 10/13/2010   ExitCare® Patient Information ©2014 [employer].

## 2013-03-02 NOTE — Progress Notes (Signed)
PACU Nursing Note: Case Manager Dept notified re: pt states he does not monitor his blood sugars at home due to him unable to afford a meter and strips. Case Management consult request to assist pt in this matter and to interview patient to assess for any other obstacles or additional equipment that pt may need.  Juanda CrumbleMichael Kalaysia Demonbreun,. RN, BSN, CPAN, CCRN

## 2013-03-02 NOTE — Telephone Encounter (Signed)
Per Dr. Alverda SkeansKoval--patient can have Accu-check Aviva Plus glucometer with sample strips from our office.  Returned call to Rosalita ChessmanSuzanne, [facility]raiserN [facility] and [facility] has already been discharged.  Called patient and left message to call our office back.  Gaylene Brooksichardson, Amery Minasyan Ann, RN

## 2013-03-02 NOTE — Progress Notes (Signed)
Pt's sister, Rhoderick MoodySusie [name] is here with pt in room.  Lawrence states she will stay here while pt in surgery and she plans to be with pt at home after surgery thru the nite.

## 2013-03-05 ENCOUNTER — Encounter (HOSPITAL_COMMUNITY): Payer: Self-pay | Admitting: Urology

## 2013-03-05 NOTE — Anesthesia Postprocedure Evaluation (Signed)
  Anesthesia Post-op Note  Patient: Lawrence RainbowMark J Schwering  Procedure(s) Performed: Procedure(s) (LRB): INSERTION OF SUPRAPUBIC CATHETER (N/A)  Patient Location: PACU  Anesthesia Type: General  Level of Consciousness: awake and alert   Airway and Oxygen Therapy: Patient Spontanous Breathing  Post-op Pain: mild  Post-op Assessment: Post-op Vital signs reviewed, Patient's Cardiovascular Status Stable, Respiratory Function Stable, Patent Airway and No signs of Nausea or vomiting  Last Vitals:  Filed Vitals:   03/02/13 1421  BP: 178/82  Pulse:   Temp: 36.7 C  Resp: 18    Post-op Vital Signs: stable   Complications: No apparent anesthesia complications

## 2013-03-14 NOTE — Telephone Encounter (Signed)
Called patient and informed that Accu-Chek Aviva Plus glucometer kit here for him to pick up at front desk.  Burna Forts, BSN, RN-BC

## 2013-03-21 ENCOUNTER — Encounter (HOSPITAL_COMMUNITY): Payer: Self-pay | Admitting: Emergency Medicine

## 2013-03-21 ENCOUNTER — Emergency Department (HOSPITAL_COMMUNITY)
Admission: EM | Admit: 2013-03-21 | Discharge: 2013-03-21 | Disposition: A | Discharge status: Home / Self Care | Payer: Medicaid Other | Attending: Emergency Medicine | Admitting: Emergency Medicine

## 2013-03-21 DIAGNOSIS — Z8719 Personal history of other diseases of the digestive system: Secondary | ICD-10-CM | POA: Insufficient documentation

## 2013-03-21 DIAGNOSIS — I129 Hypertensive chronic kidney disease with stage 1 through stage 4 chronic kidney disease, or unspecified chronic kidney disease: Secondary | ICD-10-CM | POA: Insufficient documentation

## 2013-03-21 DIAGNOSIS — Y829 Unspecified medical devices associated with adverse incidents: Secondary | ICD-10-CM

## 2013-03-21 DIAGNOSIS — Z79899 Other long term (current) drug therapy: Secondary | ICD-10-CM | POA: Insufficient documentation

## 2013-03-21 DIAGNOSIS — T83091A Other mechanical complication of indwelling urethral catheter, initial encounter: Secondary | ICD-10-CM | POA: Insufficient documentation

## 2013-03-21 DIAGNOSIS — Z7982 Long term (current) use of aspirin: Secondary | ICD-10-CM | POA: Insufficient documentation

## 2013-03-21 DIAGNOSIS — N189 Chronic kidney disease, unspecified: Secondary | ICD-10-CM | POA: Insufficient documentation

## 2013-03-21 DIAGNOSIS — Z794 Long term (current) use of insulin: Secondary | ICD-10-CM | POA: Insufficient documentation

## 2013-03-21 DIAGNOSIS — Y846 Urinary catheterization as the cause of abnormal reaction of the patient, or of later complication, without mention of misadventure at the time of the procedure: Secondary | ICD-10-CM | POA: Insufficient documentation

## 2013-03-21 DIAGNOSIS — S98139A Complete traumatic amputation of one unspecified lesser toe, initial encounter: Secondary | ICD-10-CM | POA: Insufficient documentation

## 2013-03-21 DIAGNOSIS — IMO0002 Reserved for concepts with insufficient information to code with codable children: Secondary | ICD-10-CM | POA: Insufficient documentation

## 2013-03-21 DIAGNOSIS — E119 Type 2 diabetes mellitus without complications: Secondary | ICD-10-CM | POA: Insufficient documentation

## 2013-03-21 DIAGNOSIS — Z8659 Personal history of other mental and behavioral disorders: Secondary | ICD-10-CM | POA: Insufficient documentation

## 2013-03-21 NOTE — ED Provider Notes (Signed)
CSN: [id number]     Arrival date & time 03/21/13  1826 History   First MD Initiated Contact with Patient 03/21/13 2029     Chief Complaint  Patient presents with  . foley bag leaking      (Consider location/radiation/quality/duration/timing/severity/associated sxs/prior Treatment) HPI Comments: Patient is an ongoing chronic Foley, states the back is again, he's hip replacement bag  The history is provided by the patient.    Past Medical History  Diagnosis Date  . Hypertension   . Chronic kidney disease   . Hypercholesteremia   . Diabetes mellitus     insulin dependent  . Depression   . PONV (postoperative nausea and vomiting)   . Peripheral vascular disease   . H/O hiatal hernia     hx of years ago    Past Surgical History  Procedure Laterality Date  . Amputation Right 05/03/2012    Procedure: Right First Ray AMPUTATION;  Surgeon: Eldred MangesMark C Yates, MD;  [facility];  Service: Orthopedics;  Laterality: Right;  . I&d extremity Right 05/29/2012    Procedure: IRRIGATION AND DEBRIDEMENT EXTREMITY- right foot;  Surgeon: Eldred MangesMark C Yates, MD;  Location: [facility] OR;  Service: Orthopedics;  Laterality: Right;  Right Foot Debridement, VAC Application  . Toe amputation Right 05/29/2012    1ST  ray      Dr Ophelia CharterYates  . Amputation Right 08/16/2012    Procedure: AMPUTATION BELOW KNEE;  Surgeon: Eldred MangesMark C Yates, MD;  [facility];  Service: Orthopedics;  Laterality: Right;  Right Below Knee Amputation  . Amputation Right 10/04/2012    Procedure: AMPUTATION BELOW KNEE REVISION;  Surgeon: Eldred MangesMark C Yates, MD;  Location: [facility] OR;  Service: Orthopedics;  Laterality: Right;  . Insertion of suprapubic catheter N/A 03/02/2013    Procedure: INSERTION OF SUPRAPUBIC CATHETER;  Surgeon: Su GrandMarc Nesi, MD;  Location: [facility] ORS;  Service: Urology;  Laterality: N/A;   History reviewed. No pertinent family history. History  Substance Use Topics  . Smoking status: Never Smoker   . Smokeless tobacco: Never Used  . Alcohol Use: Yes      Comment: rarely    Review of Systems  Gastrointestinal: Negative for abdominal pain.  All other systems reviewed and are negative.      Allergies  Review of patient's allergies indicates no known allergies.  Home Medications   Current Outpatient Rx  Name  Route  Sig  Dispense  Refill  . aspirin 81 MG tablet   Oral   Take 81 mg by mouth at bedtime.          . Brinzolamide-Brimonidine (SIMBRINZA) 1-0.2 % SUSP   Left Eye   Place 1 drop into the left eye 2 (two) times daily.         Marland Kitchen. HYDROcodone-acetaminophen (NORCO) 5-325 MG per tablet   Oral   Take 1 tablet by mouth every 6 (six) hours as needed for moderate pain.   30 tablet   0   . insulin glargine (LANTUS) 100 UNIT/ML injection   Subcutaneous   Inject 0.2 mLs (20 Units total) into the skin at bedtime.   10 mL   6   . metoprolol tartrate (LOPRESSOR) 25 MG tablet   Oral   Take 25 mg by mouth daily.         . prednisoLONE acetate (PRED FORTE) 1 % ophthalmic suspension   Left Eye   Place 1 drop into the left eye 3 (three) times daily.         .Marland Kitchen  PRESCRIPTION MEDICATION   Left Eye   Place 1 drop into the left eye daily. Red top eye drop          BP 185/88  Pulse 78  Temp(Src) 97.1 F (36.2 C) (Oral)  Resp 18  SpO2 98% Physical Exam  Nursing note and vitals reviewed. Constitutional: He appears well-developed and well-nourished.  HENT:  Head: Normocephalic.  Eyes: Pupils are equal, round, and reactive to light.  Neck: Normal range of motion.  Cardiovascular: Normal rate and regular rhythm.   Abdominal: Soft. He exhibits no distension.  Neurological: He is alert.  Skin: Skin is warm.    ED Course  Procedures (including critical care time) Labs Review Labs Reviewed - No data to display Imaging Review No results found.  EKG Interpretation   None       MDM   Final diagnoses:  None     Will replace Foley bag.  I recommend that contact his urologist, for a prescription that  he can fill at the medical supply store for regular replacement of his Foley leg bag    Arman Filter, NP 03/21/13 2047

## 2013-03-21 NOTE — Discharge Instructions (Signed)
Your Foley tag was replaced.  Please try to make an appointment with urologist, for prescription that, you can take to the medical supply store, Medicaid will pay for this

## 2013-03-21 NOTE — ED Notes (Signed)
Pt denies any complaints other than foley bag leaking. Pt states it happens approximately every 3 weeks. Golden urine noted in bag. No obvious leakage noted.

## 2013-03-21 NOTE — ED Provider Notes (Signed)
Medical screening examination/treatment/procedure(s) were performed by non-physician practitioner and as supervising physician I was immediately available for consultation/collaboration.  EKG Interpretation   None         Glynn OctaveStephen Tag Wurtz, MD 03/21/13 2300

## 2013-03-21 NOTE — ED Notes (Signed)
Per EMS, pt here due to foley bag leaking.

## 2013-03-21 NOTE — ED Notes (Signed)
Leg bad changed with instructions. Pt home via ptar. Pt alert x4 resp easy non labored

## 2013-03-23 ENCOUNTER — Ambulatory Visit (INDEPENDENT_AMBULATORY_CARE_PROVIDER_SITE_OTHER): Payer: Medicaid Other | Admitting: Family Medicine

## 2013-03-23 ENCOUNTER — Encounter: Payer: Self-pay | Admitting: Family Medicine

## 2013-03-23 VITALS — BP 160/80 | HR 79 | Ht 65.0 in | Wt 169.0 lb

## 2013-03-23 DIAGNOSIS — N181 Chronic kidney disease, stage 1: Secondary | ICD-10-CM

## 2013-03-23 DIAGNOSIS — I10 Essential (primary) hypertension: Secondary | ICD-10-CM

## 2013-03-23 DIAGNOSIS — IMO0001 Reserved for inherently not codable concepts without codable children: Secondary | ICD-10-CM

## 2013-03-23 DIAGNOSIS — E1165 Type 2 diabetes mellitus with hyperglycemia: Secondary | ICD-10-CM

## 2013-03-23 DIAGNOSIS — IMO0002 Reserved for concepts with insufficient information to code with codable children: Secondary | ICD-10-CM

## 2013-03-23 LAB — POCT GLYCOSYLATED HEMOGLOBIN (HGB A1C): Hemoglobin A1C: 6.8

## 2013-03-23 MED ORDER — METOPROLOL TARTRATE 25 MG PO TABS: 25.0000 mg | ORAL_TABLET | Freq: Two times a day (BID) | ORAL | Status: DC

## 2013-03-23 MED ORDER — INSULIN GLARGINE 100 UNIT/ML ~~LOC~~ SOLN: 20.0000 [IU] | Freq: Every day | SUBCUTANEOUS | Status: DC

## 2013-03-23 NOTE — Assessment & Plan Note (Signed)
A1C today is improved to 6.8%.  Continue current Lantus regimen.  Of note, worsening renal function since previously, when his A1C was much higher. Prescriptions for Lantus, I have told Loraine LericheMark that he will need to purchase now that he has Medicaid.  We are no longer able to provide free samples as we did when he was uninsured (I explained that we use the samples for uninsured people like he used to be).

## 2013-03-23 NOTE — Assessment & Plan Note (Signed)
Manual BP 160/80 by my read today; is taking metoprolol tartrate only once daily. To increase to twice daily.  Ideally, would like to put on an ACEI, will recheck Cr today and if stable or improved, will start on low-dose ACEI.

## 2013-03-23 NOTE — Assessment & Plan Note (Signed)
Likely secondary to DM, HTN, question whether possible contribution post-renal as well with bladder dysfunction. Recheck BMet today.  Consider ACEI if Cr is improved or stable.  Outpatient renal if worse.

## 2013-03-23 NOTE — Patient Instructions (Signed)
It was a pleasure to see you today.  I am increasing your metoprolol to 25mg  TWICE DAILY.   [facility] for Lantus and metoprolol.    Keep up with the baby aspirin every day.   I would like you back to check your blood pressure and to recheck your kidney function in the labs in 2 to 4 weeks.

## 2013-03-23 NOTE — Progress Notes (Signed)
   Subjective:    Patient ID: Lawrence [name], male    DOB: [birth year], 53 y.o.   MRN: [id number]  HPI Follow up on DM; he now has Medicaid, is asking for samples of Lantus due to inability to cover copay for the insulin.  Dropped the sample vials we gave him before and they broke.   Has indwelling foley catheter, Dr Brunilda PayorNesi following him. Goes back to Dr Brunilda PayorNesi on March 9th.   Sees Dr Luciana Axeankin for retinopathy, is having laser treatments.   Reports he is taking the metoprolol 25mg  one time daily.    Offered flu vaccine, he declines it.   Review of Systems Denies fevers or chills. No cough, no chest pain.     Objective:   Physical Exam Alert, in wheelchair, no apparent distress. HEENT Neck supple. No cervical adenopathy.  COR Regular S1S2 PULM Clear bilaterally.         Assessment & Plan:

## 2013-04-06 ENCOUNTER — Ambulatory Visit: Payer: Medicaid Other | Admitting: Family Medicine

## 2013-04-20 ENCOUNTER — Ambulatory Visit: Payer: Medicaid Other | Admitting: Family Medicine

## 2013-04-20 ENCOUNTER — Telehealth: Payer: Self-pay | Admitting: Family Medicine

## 2013-04-20 NOTE — Telephone Encounter (Signed)
Pt cancelled his appt today because he didn't have transporation. DSS would not come get him because he doesn't have a ramp at his house. SCAT costs 1.50 each way and he doesn't have the money. Also he has to apply for it, get a drs note and be approved before transportion will be provided. Says he doesn't have the money because he hasnt worked in over a year and is waiting for disabiltiy

## 2013-04-20 NOTE — Telephone Encounter (Signed)
Pt was coming today to get prescription for his diabetic shoes and inserts. [employer] told him they could receive a faxed prescription Fax: [phone] Please advise i

## 2013-04-20 NOTE — Telephone Encounter (Signed)
Unfortunately, patient needs an office visit for the diabetic shoe prescription.  MD has to measure patient's foot and perform a diabetic foot exam to be written on the prescription.  Pt notified.  Ofilia Rayon, Darlyne RussianKristen L, CMA

## 2013-04-26 ENCOUNTER — Ambulatory Visit: Payer: Medicaid Other | Attending: Orthopaedic Surgery | Admitting: Physical Therapy

## 2013-04-26 DIAGNOSIS — R5381 Other malaise: Secondary | ICD-10-CM | POA: Insufficient documentation

## 2013-04-26 DIAGNOSIS — R269 Unspecified abnormalities of gait and mobility: Secondary | ICD-10-CM | POA: Insufficient documentation

## 2013-04-26 DIAGNOSIS — M6281 Muscle weakness (generalized): Secondary | ICD-10-CM | POA: Insufficient documentation

## 2013-04-27 ENCOUNTER — Telehealth: Payer: Self-pay | Admitting: *Deleted

## 2013-04-27 ENCOUNTER — Ambulatory Visit (INDEPENDENT_AMBULATORY_CARE_PROVIDER_SITE_OTHER): Payer: Medicaid Other | Admitting: Family Medicine

## 2013-04-27 ENCOUNTER — Encounter: Payer: Self-pay | Admitting: Family Medicine

## 2013-04-27 VITALS — BP 158/70 | HR 60 | Ht 65.0 in | Wt 170.0 lb

## 2013-04-27 DIAGNOSIS — IMO0002 Reserved for concepts with insufficient information to code with codable children: Secondary | ICD-10-CM

## 2013-04-27 DIAGNOSIS — E1165 Type 2 diabetes mellitus with hyperglycemia: Secondary | ICD-10-CM

## 2013-04-27 DIAGNOSIS — N181 Chronic kidney disease, stage 1: Secondary | ICD-10-CM

## 2013-04-27 DIAGNOSIS — I10 Essential (primary) hypertension: Secondary | ICD-10-CM

## 2013-04-27 DIAGNOSIS — IMO0001 Reserved for inherently not codable concepts without codable children: Secondary | ICD-10-CM

## 2013-04-27 LAB — BASIC METABOLIC PANEL
BUN: 32 mg/dL — ABNORMAL HIGH (ref 6–23)
CO2: 27 mEq/L (ref 19–32)
Calcium: 9.2 mg/dL (ref 8.4–10.5)
Chloride: 108 mEq/L (ref 96–112)
Creat: 1.78 mg/dL — ABNORMAL HIGH (ref 0.50–1.35)
Glucose, Bld: 77 mg/dL (ref 70–99)
POTASSIUM: 4.5 meq/L (ref 3.5–5.3)
SODIUM: 143 meq/L (ref 135–145)

## 2013-04-27 MED ORDER — LISINOPRIL 20 MG PO TABS: 20.0000 mg | ORAL_TABLET | Freq: Every day | ORAL | Status: DC

## 2013-04-27 NOTE — Progress Notes (Signed)
Subjective:     Patient ID: Lawrence [name], male   DOB: Jun 24, 1960, 53 y.o.   MRN: [id number]  Attending Note; patient seen and examined by me in conjunction with medical student Marlou Porch, [facility] and I agree with his documentation with additions in bold type.  [name] comes in for follow up of DM.  Would like a diabetic shoe with insert for his left foot; he is s/p R BKA.  Denies chest pain/pressure, dyspnea, cough, fever or chills. Checks stump and L foot regularly.  Does not check CBGs at home.   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  Lawrence [name] is going through the process of applying for disability.  He expresses concern with his ability to purchase any medications, even generic box-store $4 list meds, due to financial constraints.    We discussed the recent elevation in his serum Cr and the fact that his uncontrolled DM and HTN are contributing to this worsening.  He had been on ACEi in the past, however his medication list was dramatically reduced after leaving the nursing home after his BKA.  He reports that he thinks his A1C got better because of better control while he was in the SNF. JB  HPI:  Lawrence [name] presents for f/u of his diabetes.  He would like a prescription for diabetic shoes.  His diabetes is managed on Insulin alone, and most recent HbA1c was 6.8% in Feb 2015.  He does not check sugars at home, but knows what to look for if he were to become hypoglycemic and has not experienced this.  He has a BKA on the R leg, and the stump has not been giving him any problems.  He checks his left foot consistently.  He also wants to know what is going on with his kidneys, as he remembers the blood work  "showing something" at his last visit (creatinine was 1.63 on 03/02/13).    He has recently had significant blurry vision in both eyes, left worse that right.  Dr. Luciana Axe manages his retinopathy, which most recently demonstrated proliferation of blood vessels.   Received an injection on Monday .   Review of Systems Negative for chest pain, shortness of breath, edema, double vision, headaches, tingling in hands and feet.    Objective:   Physical Exam  Constitutional: He appears well-developed and well-nourished.  HENT:  Head: Normocephalic.  Eyes: Conjunctivae are normal. Pupils are equal, round, and reactive to light.  Neck: Neck supple.  Cardiovascular: Normal rate, regular rhythm, normal heart sounds and intact distal pulses.   Pulmonary/Chest: Effort normal and breath sounds normal.  Abdominal: Soft. Bowel sounds are normal.  Skin: Skin is dry.   There is a crackling, peeling, scaly skin in between his toes.  EXAM: Well appearing, in wheelchair, no apparent distress HEENT neck supple.  COR Regular S1S2, no extra sounds PULM Clear bilaterally, no rales or wheezes.  EXTS: Left foot with some flaking and maceration of skin between digits 3/4 and 4/5.  No open wounds. Sensation grossly intact with monofilament testing. JB     Assessment:      [name] diabetic condition is worsening.  Despite good glycemic control, he is developing renal disease.  He needs to be put on an ACE to help protect kidney function.  His eyesight is also worsening and his retinopathy is being followed by Dr. Luciana Axe.  His high blood pressure puts him at an even greater risk for stroke and  heart attack.  The ACE, combined with the metoprolol that he is already taking, should help with his blood pressure--assuming he will purchase the medication.  The $4 medication is still too expensive for him.  He has dry and crackly skin on his L foot, and may have developed a fungal infection Plan:     1. Lisinopril 20 mg qd  2. CMP to recheck creatinine 3. F/u in 4-6 weeks to check renal function and assess tolerance to ACE.

## 2013-04-27 NOTE — Assessment & Plan Note (Signed)
Handwritten Rx for diabetic shoe with inserts given to Red team staff, to be faxed to medical supply company. JB

## 2013-04-27 NOTE — Telephone Encounter (Signed)
Fax[facility] Rx for diabetic shoes to [facility] [phone] .Arlyss RepressSlade, Kahil Agner

## 2013-04-27 NOTE — Assessment & Plan Note (Signed)
Lawrence [name] has had a rise in his Cr likely related to DM/HTN nephropathy.  To add ACEI to regimen, recheck BMet 2 weeks after he starts back on the med.  He expresses doubts about his ability to afford the med, even though it is a $4 med at [facility].    Also to discuss addition of statin at next visit; again, concern over Lawrence [name]'s ability to afford/reinstitute these meds all at one time. WIll plan to follow up on this at his next appointment. JB

## 2013-04-27 NOTE — Patient Instructions (Signed)
It was a pleasure to see you today.   As we discussed, I am concerned for your blood pressure and kidney function, which is worsening. Recheck of the kidney function today.  I am starting you back on lisinopril 20mg  daily, and to recehck the kidney function 2 weeks after you begin taking it.   I hand-wrote a prescription for diabetic shoes with insert, to be faxed to [facility].   Labs 2 weeks after you start the lisinopril. Follow up in 3 months with me.

## 2013-04-30 ENCOUNTER — Encounter: Payer: Self-pay | Admitting: Family Medicine

## 2013-05-03 ENCOUNTER — Telehealth: Payer: Self-pay | Admitting: Family Medicine

## 2013-05-03 DIAGNOSIS — I10 Essential (primary) hypertension: Secondary | ICD-10-CM

## 2013-05-03 NOTE — Telephone Encounter (Signed)
Patient would like to get 90 day supply of all meds. States its cheaper that way.

## 2013-05-03 NOTE — Telephone Encounter (Signed)
Will fwd to PCP for refills. Thanks .Arlyss RepressSlade, Wednesday Ericsson

## 2013-05-04 MED ORDER — METOPROLOL TARTRATE 25 MG PO TABS: 25.0000 mg | ORAL_TABLET | Freq: Two times a day (BID) | ORAL | Status: DC

## 2013-05-04 MED ORDER — LISINOPRIL 20 MG PO TABS: 20.0000 mg | ORAL_TABLET | Freq: Every day | ORAL | Status: DC

## 2013-05-04 MED ORDER — INSULIN GLARGINE 100 UNIT/ML ~~LOC~~ SOLN: 20.0000 [IU] | Freq: Every day | SUBCUTANEOUS | Status: DC

## 2013-05-04 NOTE — Telephone Encounter (Signed)
90-day refills done. JB

## 2013-05-04 NOTE — Telephone Encounter (Signed)
Pt notified.  Amberli Ruegg L, CMA  

## 2013-05-09 ENCOUNTER — Ambulatory Visit: Payer: Medicaid Other | Attending: Orthopaedic Surgery | Admitting: Physical Therapy

## 2013-05-09 DIAGNOSIS — M6281 Muscle weakness (generalized): Secondary | ICD-10-CM | POA: Insufficient documentation

## 2013-05-09 DIAGNOSIS — R269 Unspecified abnormalities of gait and mobility: Secondary | ICD-10-CM | POA: Insufficient documentation

## 2013-05-09 DIAGNOSIS — R5381 Other malaise: Secondary | ICD-10-CM | POA: Diagnosis not present

## 2013-05-09 DIAGNOSIS — Z4789 Encounter for other orthopedic aftercare: Secondary | ICD-10-CM | POA: Diagnosis present

## 2013-05-16 ENCOUNTER — Ambulatory Visit: Payer: Medicaid Other | Admitting: Physical Therapy

## 2013-05-16 DIAGNOSIS — R269 Unspecified abnormalities of gait and mobility: Secondary | ICD-10-CM | POA: Diagnosis not present

## 2013-05-17 ENCOUNTER — Emergency Department (HOSPITAL_COMMUNITY)
Admission: EM | Admit: 2013-05-17 | Discharge: 2013-05-18 | Disposition: A | Discharge status: Home / Self Care | Payer: Medicaid Other | Attending: Emergency Medicine | Admitting: Emergency Medicine

## 2013-05-17 ENCOUNTER — Encounter (HOSPITAL_COMMUNITY): Payer: Self-pay | Admitting: Emergency Medicine

## 2013-05-17 DIAGNOSIS — I120 Hypertensive chronic kidney disease with stage 5 chronic kidney disease or end stage renal disease: Secondary | ICD-10-CM | POA: Insufficient documentation

## 2013-05-17 DIAGNOSIS — R301 Vesical tenesmus: Secondary | ICD-10-CM

## 2013-05-17 DIAGNOSIS — Z8719 Personal history of other diseases of the digestive system: Secondary | ICD-10-CM | POA: Insufficient documentation

## 2013-05-17 DIAGNOSIS — R109 Unspecified abdominal pain: Secondary | ICD-10-CM | POA: Insufficient documentation

## 2013-05-17 DIAGNOSIS — Y846 Urinary catheterization as the cause of abnormal reaction of the patient, or of later complication, without mention of misadventure at the time of the procedure: Secondary | ICD-10-CM | POA: Insufficient documentation

## 2013-05-17 DIAGNOSIS — N3289 Other specified disorders of bladder: Secondary | ICD-10-CM | POA: Insufficient documentation

## 2013-05-17 DIAGNOSIS — Z7982 Long term (current) use of aspirin: Secondary | ICD-10-CM | POA: Insufficient documentation

## 2013-05-17 DIAGNOSIS — E119 Type 2 diabetes mellitus without complications: Secondary | ICD-10-CM | POA: Insufficient documentation

## 2013-05-17 DIAGNOSIS — E7800 Pure hypercholesterolemia, unspecified: Secondary | ICD-10-CM | POA: Insufficient documentation

## 2013-05-17 DIAGNOSIS — Z794 Long term (current) use of insulin: Secondary | ICD-10-CM | POA: Insufficient documentation

## 2013-05-17 DIAGNOSIS — Z8659 Personal history of other mental and behavioral disorders: Secondary | ICD-10-CM | POA: Insufficient documentation

## 2013-05-17 DIAGNOSIS — Z79899 Other long term (current) drug therapy: Secondary | ICD-10-CM | POA: Insufficient documentation

## 2013-05-17 DIAGNOSIS — N189 Chronic kidney disease, unspecified: Secondary | ICD-10-CM | POA: Insufficient documentation

## 2013-05-17 DIAGNOSIS — IMO0002 Reserved for concepts with insufficient information to code with codable children: Secondary | ICD-10-CM | POA: Insufficient documentation

## 2013-05-17 DIAGNOSIS — L539 Erythematous condition, unspecified: Secondary | ICD-10-CM | POA: Insufficient documentation

## 2013-05-17 MED ORDER — METOPROLOL TARTRATE 25 MG PO TABS
25.0000 mg | ORAL_TABLET | Freq: Once | ORAL | Status: AC
  Administered 2013-05-18: 25 mg via ORAL
  Filled 2013-05-17: qty 1

## 2013-05-17 MED ORDER — NYSTATIN 100000 UNIT/GM EX POWD
Freq: Once | CUTANEOUS | Status: AC
  Administered 2013-05-18: via TOPICAL
  Filled 2013-05-17: qty 15

## 2013-05-17 MED ORDER — OXYBUTYNIN CHLORIDE 5 MG PO TABS
2.5000 mg | ORAL_TABLET | Freq: Three times a day (TID) | ORAL | Status: DC | PRN
  Administered 2013-05-18: 2.5 mg via ORAL
  Filled 2013-05-17: qty 0.5

## 2013-05-17 MED ORDER — PHENAZOPYRIDINE HCL 100 MG PO TABS
200.0000 mg | ORAL_TABLET | Freq: Once | ORAL | Status: AC
  Administered 2013-05-17: 200 mg via ORAL
  Filled 2013-05-17: qty 2

## 2013-05-17 NOTE — ED Notes (Signed)
Pt has suprapubic catheter, but reports penile pain. Dr. Norlene Campbelltter at bedside.

## 2013-05-17 NOTE — ED Notes (Signed)
PER EMS: pt from home, reports pain at the insertion site of his urinary catheter due to chronic kidney disease, inserted "a couple of months ago." Urine clear in bag. Denies blood in urine. Pain 10/10. Pt A&POx4. BP-140/107, HR-93, O2-99% RA.

## 2013-05-18 LAB — URINALYSIS, ROUTINE W REFLEX MICROSCOPIC
Bilirubin Urine: NEGATIVE
Glucose, UA: NEGATIVE mg/dL
KETONES UR: NEGATIVE mg/dL
Nitrite: NEGATIVE
Specific Gravity, Urine: 1.012 (ref 1.005–1.030)
UROBILINOGEN UA: 1 mg/dL (ref 0.0–1.0)
pH: 6.5 (ref 5.0–8.0)

## 2013-05-18 LAB — URINE MICROSCOPIC-ADD ON

## 2013-05-18 MED ORDER — BELLADONNA ALKALOIDS-OPIUM 16.2-60 MG RE SUPP
1.0000 | Freq: Once | RECTAL | Status: AC
  Administered 2013-05-18: 1 via RECTAL
  Filled 2013-05-18: qty 1

## 2013-05-18 MED ORDER — OXYBUTYNIN CHLORIDE 5 MG PO TABS: 5.0000 mg | ORAL_TABLET | Freq: Three times a day (TID) | ORAL | Status: DC | PRN

## 2013-05-18 MED ORDER — HYDROCODONE-ACETAMINOPHEN 5-325 MG PO TABS: 2.0000 | ORAL_TABLET | ORAL | Status: DC | PRN

## 2013-05-18 NOTE — Discharge Instructions (Signed)
Please call your urologist later today to have follow up scheduled.  Take medications as prescribed.  Your urine was sent for culture today, and you will be contacted if you need to take antibiotics.  Return to the ER for worsening condition or new concerning symptoms.

## 2013-05-18 NOTE — ED Notes (Signed)
[facility]

## 2013-05-18 NOTE — ED Notes (Signed)
MD at bedside. 

## 2013-05-18 NOTE — ED Provider Notes (Signed)
CSN: [id number]     Arrival date & time 05/17/13  2318 History   First MD Initiated Contact with Patient 05/17/13 2334     Chief Complaint  Patient presents [id number]  . Suprapubic Catheter Complication    . Groin Pain     (Consider location/radiation/quality/duration/timing/severity/associated sxs/prior Treatment) HPI 53 year old male presents to emergency apartment from home via EMS with complaint of penile pain.  Patient reports he's had increasing pain in his urethra as though he needs to pee, and the urine is burning from the inside.  This began earlier in the week after having his suprapubic catheter replaced.  He reports he's had similar symptoms in the past with replacement of his catheter.  Usually they go away after a day, but this time symptoms have persisted.  He reports good output through his suprapubic catheter.  He does not urinate from his penis since having the suprapubic catheter placed.  No fevers no chills. Past Medical History  Diagnosis Date  . Hypertension   . Chronic kidney disease   . Hypercholesteremia   . Diabetes mellitus     insulin dependent  . Depression   . PONV (postoperative nausea and vomiting)   . Peripheral vascular disease   . H/O hiatal hernia     hx of years ago    Past Surgical History  Procedure Laterality Date  . Amputation Right 05/03/2012    Procedure: Right First Ray AMPUTATION;  Surgeon: Eldred MangesMark C Yates, MD;  [facility];  Service: Orthopedics;  Laterality: Right;  . I&d extremity Right 05/29/2012    Procedure: IRRIGATION AND DEBRIDEMENT EXTREMITY- right foot;  Surgeon: Eldred MangesMark C Yates, MD;  Location: [facility] OR;  Service: Orthopedics;  Laterality: Right;  Right Foot Debridement, VAC Application  . Toe amputation Right 05/29/2012    1ST  ray      Dr Ophelia CharterYates  . Amputation Right 08/16/2012    Procedure: AMPUTATION BELOW KNEE;  Surgeon: Eldred MangesMark C Yates, MD;  [facility];  Service: Orthopedics;  Laterality: Right;  Right Below Knee Amputation  . Amputation  Right 10/04/2012    Procedure: AMPUTATION BELOW KNEE REVISION;  Surgeon: Eldred MangesMark C Yates, MD;  Location: [facility] OR;  Service: Orthopedics;  Laterality: Right;  . Insertion of suprapubic catheter N/A 03/02/2013    Procedure: INSERTION OF SUPRAPUBIC CATHETER;  Surgeon: Su GrandMarc Nesi, MD;  Location: [facility] ORS;  Service: Urology;  Laterality: N/A;   No family history on file. History  Substance Use Topics  . Smoking status: Never Smoker   . Smokeless tobacco: Never Used  . Alcohol Use: Yes     Comment: rarely    Review of Systems  See History of Present Illness; otherwise all other systems are reviewed and negative   Allergies  Review of patient's allergies indicates no known allergies.  Home Medications   Current Outpatient Rx  Name  Route  Sig  Dispense  Refill  . aspirin 81 MG tablet   Oral   Take 81 mg by mouth at bedtime.          . Brinzolamide-Brimonidine (SIMBRINZA) 1-0.2 % SUSP   Left Eye   Place 1 drop into the left eye 2 (two) times daily.         Marland Kitchen. HYDROcodone-acetaminophen (NORCO) 5-325 MG per tablet   Oral   Take 1 tablet by mouth every 6 (six) hours as needed for moderate pain.   30 tablet   0   . insulin glargine (LANTUS) 100 UNIT/ML injection  Subcutaneous   Inject 0.2 mLs (20 Units total) into the skin at bedtime.   20 mL   3   . lisinopril (PRINIVIL,ZESTRIL) 20 MG tablet   Oral   Take 1 tablet (20 mg total) by mouth daily.   90 tablet   4   . metoprolol tartrate (LOPRESSOR) 25 MG tablet   Oral   Take 1 tablet (25 mg total) by mouth 2 (two) times daily.   180 tablet   4   . prednisoLONE acetate (PRED FORTE) 1 % ophthalmic suspension   Left Eye   Place 1 drop into the left eye 3 (three) times daily.         Marland Kitchen PRESCRIPTION MEDICATION   Left Eye   Place 1 drop into the left eye daily. Red top eye drop          BP 140/107  Pulse 93  Temp(Src) 97.9 F (36.6 C) (Oral)  Resp 18  Ht 5\' 5"  (1.651 m)  Wt 175 lb (79.379 kg)  BMI 29.12 kg/m2  SpO2  99% Physical Exam  Nursing note and vitals reviewed. Constitutional: He is oriented to person, place, and time. He appears well-developed and well-nourished. No distress.  HENT:  Head: Normocephalic and atraumatic.  Nose: Nose normal.  Mouth/Throat: Oropharynx is clear and moist.  Eyes: Conjunctivae and EOM are normal. Pupils are equal, round, and reactive to light.  Neck: Normal range of motion. Neck supple. No JVD present. No tracheal deviation present. No thyromegaly present.  Cardiovascular: Normal rate, regular rhythm, normal heart sounds and intact distal pulses.  Exam reveals no gallop and no friction rub.   No murmur heard. Pulmonary/Chest: Effort normal and breath sounds normal. No stridor. No respiratory distress. He has no wheezes. He has no rales. He exhibits no tenderness.  Abdominal: Soft. Bowel sounds are normal. He exhibits no distension and no mass. There is no tenderness. There is no rebound and no guarding.  Genitourinary:  Foreskin retracted, glans appears normal, he does have a discharge within the foreskin that appears to be yeast.  Penis is nontender to palpation.  Patient has erythematous rash around the insertion of the suprapubic catheter, which also appears to be a candida infection  Lymphadenopathy:    He has no cervical adenopathy.  Neurological: He is alert and oriented to person, place, and time. He exhibits normal muscle tone. Coordination normal.  Skin: Skin is warm and dry. No rash noted. No erythema. No pallor.  Psychiatric: He has a normal mood and affect. His behavior is normal. Judgment and thought content normal.    ED Course  Procedures (including critical care time) Labs Review Labs Reviewed  URINALYSIS, ROUTINE W REFLEX MICROSCOPIC - Abnormal; Notable for the following:    APPearance CLOUDY (*)    Hgb urine dipstick MODERATE (*)    Protein, ur >300 (*)    Leukocytes, UA TRACE (*)    All other components within normal limits  URINE  MICROSCOPIC-ADD ON - Abnormal; Notable for the following:    Bacteria, UA MANY (*)    All other components within normal limits  URINE CULTURE   Imaging Review No results found.   EKG Interpretation None      MDM   Final diagnoses:  Painful bladder spasm    53 year old male with penile pain.  Symptoms started after having his suprapubic catheter replaced.  He may be having some bladder spasms.  Urine sent for examination.  Bladder scan shows less than 10  mL's in his bladder.  Will start on nystatin powder for the appearance of yeast.  Will refer him back to his urologist.    Olivia [name], MD 05/18/13 [phone]

## 2013-05-18 NOTE — ED Notes (Signed)
MD informed of pt's request for pain medication, pt states his pain has returned.

## 2013-05-20 LAB — URINE CULTURE: Colony Count: >=100000

## 2013-05-22 ENCOUNTER — Ambulatory Visit: Payer: Medicaid Other | Admitting: Physical Therapy

## 2013-05-22 ENCOUNTER — Telehealth (HOSPITAL_BASED_OUTPATIENT_CLINIC_OR_DEPARTMENT_OTHER): Payer: Self-pay | Admitting: *Deleted

## 2013-05-22 DIAGNOSIS — R269 Unspecified abnormalities of gait and mobility: Secondary | ICD-10-CM | POA: Diagnosis not present

## 2013-05-22 NOTE — ED Notes (Signed)
+   Urine Recommend Keflex 500 mg TID x 7 days  Per Trixie DredgeEmily West  Needs to be called to pharmacy.

## 2013-05-23 NOTE — Progress Notes (Signed)
ED Antimicrobial Stewardship Positive Culture Follow Up   [name] is an 53 y.o. male who [facility] with a chief complaint of  Chief Complaint  Patient presents with  . Suprapubic Catheter Complication    . Groin Pain    Recent Results (from the past 720 hour(s))  URINE CULTURE     Status: None   Collection Time    05/17/13 11:29 PM      Result Value Ref Range Status   Specimen Description URINE, CATHETERIZED   Final   Special Requests NONE   Final   Culture  Setup Time     Final   Value: 05/18/2013 01:27     Performed at [facility]   Colony Count     Final   Value: >=100,000 COLONIES/ML     Performed at [facility]   Culture     Final   Value: KLEBSIELLA OXYTOCA     Performed at [facility]   Report Status 05/20/2013 FINAL   Final   Organism ID, Bacteria KLEBSIELLA OXYTOCA   Final    [x]  Patient discharged originally without antimicrobial agent and treatment is now indicated  New antibiotic prescription: Keflex PO 500mg  TID x7 days  ED Provider: Trixie DredgeEmily West, PA   Shelba FlakeNathan E. Achilles Dunkope, PharmD Clinical Pharmacist - Resident Pager: [phone] Pharmacy: [phone] 05/22/2013

## 2013-05-24 ENCOUNTER — Telehealth (HOSPITAL_BASED_OUTPATIENT_CLINIC_OR_DEPARTMENT_OTHER): Payer: Self-pay

## 2013-05-24 NOTE — Telephone Encounter (Signed)
alled to check urine culture results. states he does not have any symptoms, Urine is draining clear.  no fevers.  has follow up appointment with urologist on May 4th. Will hold off on antibiotics for now.

## 2013-05-25 ENCOUNTER — Ambulatory Visit: Payer: Medicaid Other | Admitting: Family Medicine

## 2013-05-30 ENCOUNTER — Ambulatory Visit: Payer: Medicaid Other | Admitting: Physical Therapy

## 2013-05-30 DIAGNOSIS — R269 Unspecified abnormalities of gait and mobility: Secondary | ICD-10-CM | POA: Diagnosis not present

## 2013-06-06 ENCOUNTER — Ambulatory Visit: Payer: Medicaid Other | Admitting: Physical Therapy

## 2013-06-06 DIAGNOSIS — R269 Unspecified abnormalities of gait and mobility: Secondary | ICD-10-CM | POA: Diagnosis not present

## 2013-06-08 ENCOUNTER — Encounter: Payer: Self-pay | Admitting: Family Medicine

## 2013-06-08 ENCOUNTER — Ambulatory Visit (INDEPENDENT_AMBULATORY_CARE_PROVIDER_SITE_OTHER): Payer: Medicaid Other | Admitting: Family Medicine

## 2013-06-08 VITALS — BP 186/63 | HR 63 | Ht 65.0 in | Wt 175.0 lb

## 2013-06-08 DIAGNOSIS — E1165 Type 2 diabetes mellitus with hyperglycemia: Secondary | ICD-10-CM

## 2013-06-08 DIAGNOSIS — I10 Essential (primary) hypertension: Secondary | ICD-10-CM

## 2013-06-08 DIAGNOSIS — N181 Chronic kidney disease, stage 1: Secondary | ICD-10-CM

## 2013-06-08 DIAGNOSIS — IMO0002 Reserved for concepts with insufficient information to code with codable children: Secondary | ICD-10-CM

## 2013-06-08 DIAGNOSIS — IMO0001 Reserved for inherently not codable concepts without codable children: Secondary | ICD-10-CM

## 2013-06-08 LAB — BASIC METABOLIC PANEL
BUN: 30 mg/dL — AB (ref 6–23)
CHLORIDE: 110 meq/L (ref 96–112)
CO2: 25 mEq/L (ref 19–32)
CREATININE: 1.62 mg/dL — AB (ref 0.50–1.35)
Calcium: 9.2 mg/dL (ref 8.4–10.5)
Glucose, Bld: 161 mg/dL — ABNORMAL HIGH (ref 70–99)
Potassium: 4.7 mEq/L (ref 3.5–5.3)
Sodium: 140 mEq/L (ref 135–145)

## 2013-06-08 MED ORDER — INSULIN GLARGINE 100 UNIT/ML ~~LOC~~ SOLN: 20.0000 [IU] | Freq: Every day | SUBCUTANEOUS | Status: DC

## 2013-06-08 MED ORDER — "INSULIN SYRINGE 30G X 5/16"" 0.3 ML MISC": 1.0000 | Freq: Every day | Status: DC

## 2013-06-08 MED ORDER — AMLODIPINE BESYLATE 5 MG PO TABS: 5.0000 mg | ORAL_TABLET | Freq: Every day | ORAL | Status: DC

## 2013-06-08 NOTE — Assessment & Plan Note (Signed)
To recheck A1C in the coming month; was better controlled at the last visit than previous visits. Continue with Lantus and refills done today.

## 2013-06-08 NOTE — Progress Notes (Signed)
   Subjective:    Patient ID: Lawrence [name], male    DOB: Sep 23, 1960, 53 y.o.   MRN: [id number]  HPI Here for follow up on HTN, as well as CKD.  He has had a relatively rapidly advancing Cr.  Is taking the lisinopril 20mg  daily.  Has been able to afford with the help of loans from friends.   Recently in ED with pain in penis, diagnosed with bladder spasms.  Has indwelling catheter that is changed in Urology monthly.  Has follow up there with Dr Brunilda PayorNesi on May 6th.  The penile pain is somewhat improved but not gone.  No fevers or chills.  Has not been checking blood pressure at home.  Does not check this nor sugars because "I can't see well enough to read the results".  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  Is actively being followed for this.   Has a form for [employer] to have his doctor attest to need for [employer] bus.  I completed form today and had a copy scanned; completed on basis of his R lower extremity amputation and inability to ambulate 3 blocks to bus stop.  He uses walker sometimes, usually manual wheelchair.   Denies chest pain or dyspnea, no cough, no fevers or chills.    Review of Systems     Objective:   Physical Exam Well appearing, no apparent distress HEENT Neck supple, no cervical adenopathy COR regular S1S2 PULM Clear bilaterally, no rales or wheezes.        Assessment & Plan:

## 2013-06-08 NOTE — Assessment & Plan Note (Signed)
CKDIV likely secondary to uncontrolled HTN and previously poorly controlled DM.  Recently started on AcEI; to have recheck of BMet today.  Referral to [facility] for outpatient evaluation.  Will add CCB today to better control his BP.  He had been on several BP medications in the past [place] but these had been discontinued due to cost issues.  To recheck BP with our RN clinic and escalate meds to try and get controlled.  Continue ASA daily.

## 2013-06-08 NOTE — Assessment & Plan Note (Signed)
Blood pressure not controlled; this is likely the cause of his accelerated Chronic Kidney disease. Adding amlodipine back on his medication regimen today, to check BP in the coming 2-3 weeks and escalate to 10mg /day if needed. Checking BMet today given recent starting on ACEI.  Will call him with results.

## 2013-06-08 NOTE — Patient Instructions (Signed)
It was a pleasure to see you today.  We are adding AMLODIPINE 5mg  ONE TIME DAILY to the lisinopril 20mg  daily you are already taking.   I will contact you [phone] with the results.   I would like to have your blood pressure checked again in another 2-3 weeks with our nurse (RN CLINIC) to be sure we are getting better control.   I am making a referral with [facility] to be initiated.  RN CLINIC 2-3 WEEKS FOR BP CHECK AND REPEAT HGB A1C. FOLLOW UP WITH DR Mauricio PoBREEN IN 4 TO 6 WEEKS.

## 2013-06-11 ENCOUNTER — Telehealth: Payer: Self-pay | Admitting: Family Medicine

## 2013-06-11 NOTE — Telephone Encounter (Signed)
Call to patient to report results of labs from Friday, May 1st.  I wish to tell him that his kidney function is stabilized from previous lab result.  I also wanted to confirm that he has received antibiotic therapy for his positive urine culture done in the ED on April 09th.  I left voice message asking him to call back. JB

## 2013-06-11 NOTE — Telephone Encounter (Signed)
Message given to pt. Will be going to urlogist on wed

## 2013-06-12 ENCOUNTER — Ambulatory Visit: Payer: Medicaid Other | Attending: Orthopaedic Surgery | Admitting: Physical Therapy

## 2013-06-12 DIAGNOSIS — M6281 Muscle weakness (generalized): Secondary | ICD-10-CM | POA: Insufficient documentation

## 2013-06-12 DIAGNOSIS — R5381 Other malaise: Secondary | ICD-10-CM | POA: Insufficient documentation

## 2013-06-12 DIAGNOSIS — R269 Unspecified abnormalities of gait and mobility: Secondary | ICD-10-CM | POA: Insufficient documentation

## 2013-06-13 ENCOUNTER — Encounter: Payer: Medicaid Other | Admitting: Physical Therapy

## 2013-06-20 ENCOUNTER — Ambulatory Visit: Payer: Medicaid Other | Admitting: Physical Therapy

## 2013-06-22 ENCOUNTER — Emergency Department (HOSPITAL_COMMUNITY)
Admission: EM | Admit: 2013-06-22 | Discharge: 2013-06-22 | Disposition: A | Discharge status: Home / Self Care | Payer: Medicaid Other | Attending: Emergency Medicine | Admitting: Emergency Medicine

## 2013-06-22 ENCOUNTER — Ambulatory Visit: Payer: Medicaid Other | Admitting: Family Medicine

## 2013-06-22 ENCOUNTER — Ambulatory Visit (INDEPENDENT_AMBULATORY_CARE_PROVIDER_SITE_OTHER): Payer: Medicaid Other | Admitting: Family Medicine

## 2013-06-22 ENCOUNTER — Encounter (HOSPITAL_COMMUNITY): Payer: Self-pay | Admitting: Emergency Medicine

## 2013-06-22 ENCOUNTER — Encounter: Payer: Self-pay | Admitting: Family Medicine

## 2013-06-22 VITALS — BP 138/64 | HR 123 | Temp 99.0°F | Resp 20

## 2013-06-22 DIAGNOSIS — E119 Type 2 diabetes mellitus without complications: Secondary | ICD-10-CM | POA: Insufficient documentation

## 2013-06-22 DIAGNOSIS — N189 Chronic kidney disease, unspecified: Secondary | ICD-10-CM | POA: Insufficient documentation

## 2013-06-22 DIAGNOSIS — E1165 Type 2 diabetes mellitus with hyperglycemia: Secondary | ICD-10-CM

## 2013-06-22 DIAGNOSIS — I129 Hypertensive chronic kidney disease with stage 1 through stage 4 chronic kidney disease, or unspecified chronic kidney disease: Secondary | ICD-10-CM | POA: Insufficient documentation

## 2013-06-22 DIAGNOSIS — IMO0002 Reserved for concepts with insufficient information to code with codable children: Secondary | ICD-10-CM | POA: Insufficient documentation

## 2013-06-22 DIAGNOSIS — R05 Cough: Secondary | ICD-10-CM

## 2013-06-22 DIAGNOSIS — Z7982 Long term (current) use of aspirin: Secondary | ICD-10-CM | POA: Insufficient documentation

## 2013-06-22 DIAGNOSIS — R059 Cough, unspecified: Secondary | ICD-10-CM

## 2013-06-22 DIAGNOSIS — Z794 Long term (current) use of insulin: Secondary | ICD-10-CM | POA: Insufficient documentation

## 2013-06-22 DIAGNOSIS — E7800 Pure hypercholesterolemia, unspecified: Secondary | ICD-10-CM | POA: Insufficient documentation

## 2013-06-22 DIAGNOSIS — Z8719 Personal history of other diseases of the digestive system: Secondary | ICD-10-CM | POA: Insufficient documentation

## 2013-06-22 DIAGNOSIS — N181 Chronic kidney disease, stage 1: Secondary | ICD-10-CM

## 2013-06-22 DIAGNOSIS — Z8659 Personal history of other mental and behavioral disorders: Secondary | ICD-10-CM | POA: Insufficient documentation

## 2013-06-22 DIAGNOSIS — N3289 Other specified disorders of bladder: Secondary | ICD-10-CM

## 2013-06-22 DIAGNOSIS — IMO0001 Reserved for inherently not codable concepts without codable children: Secondary | ICD-10-CM

## 2013-06-22 DIAGNOSIS — R053 Chronic cough: Secondary | ICD-10-CM

## 2013-06-22 LAB — URINALYSIS, ROUTINE W REFLEX MICROSCOPIC
BILIRUBIN URINE: NEGATIVE
Glucose, UA: 100 mg/dL — AB
HGB URINE DIPSTICK: NEGATIVE
Ketones, ur: NEGATIVE mg/dL
Nitrite: NEGATIVE
Protein, ur: >300 mg/dL — AB
Specific Gravity, Urine: 1.015 (ref 1.005–1.030)
UROBILINOGEN UA: 0.2 mg/dL (ref 0.0–1.0)
pH: 8 (ref 5.0–8.0)

## 2013-06-22 LAB — URINE MICROSCOPIC-ADD ON

## 2013-06-22 LAB — POCT GLYCOSYLATED HEMOGLOBIN (HGB A1C): Hemoglobin A1C: 7.2

## 2013-06-22 MED ORDER — ATORVASTATIN CALCIUM 40 MG PO TABS: 40.0000 mg | ORAL_TABLET | Freq: Every day | ORAL | Status: DC

## 2013-06-22 MED ORDER — HYDROMORPHONE HCL PF 1 MG/ML IJ SOLN
1.0000 mg | Freq: Once | INTRAMUSCULAR | Status: AC
  Administered 2013-06-22: 1 mg via INTRAMUSCULAR
  Filled 2013-06-22: qty 1

## 2013-06-22 MED ORDER — OXYCODONE-ACETAMINOPHEN 5-325 MG PO TABS: 2.0000 | ORAL_TABLET | ORAL | Status: DC | PRN

## 2013-06-22 MED ORDER — CEPHALEXIN 500 MG PO CAPS: 500.0000 mg | ORAL_CAPSULE | Freq: Four times a day (QID) | ORAL | Status: DC

## 2013-06-22 MED ORDER — BELLADONNA ALKALOIDS-OPIUM 16.2-60 MG RE SUPP
1.0000 | Freq: Once | RECTAL | Status: AC
  Administered 2013-06-22: 1 via RECTAL

## 2013-06-22 NOTE — Assessment & Plan Note (Signed)
Restarted statin now that he has ability to obtain via Medicaid.

## 2013-06-22 NOTE — Progress Notes (Signed)
   Subjective:    Patient ID: Lawrence [name], male    DOB: Apr 27, 1960, 53 y.o.   MRN: [id number]  HPI Lawrence [name] is here for follow up.  On May 1st we added amlodipine for blood pressure control; his BP is better controlled today, now on CCB, BB, and ACEI.  Reviewed results of his most recent labs.  A1C today is mildly increased from previous value 3 months ago.  He is using hte Lantus 20units daily.   Was in ED this morning for bladder spasms, told he likely has a bladder infection and is being treated with Keflex.  Awaiting culture results.  No fevers or chills, no chest pain, no shortness of breath.  Has some dry cough, no reflux sx although he has a history of GERD ("does not feel like my GERD").  Never-smoker. No sputum production.   [place] to see his urologist, Dr Brunilda PayorNesi, again on June 2nd.  He has not been seen by Nephrology.    Review of Systems     Objective:   Physical Exam Well appearing, no apparent distress HEENT Neck supple. TMs clear; clear nasal mucosa. No rhinorrhea. Clear oropharynx, no adenopathy COR REgular S1S2, no extra sounds PULM Clear bilaterally, no rales or wheezes ABD Soft, nontender.        Assessment & Plan:

## 2013-06-22 NOTE — Patient Instructions (Addendum)
It was a pleasure to see you today. Your hemoglobin A1C today is 7.2%, which is up slightly from last visit.    I am pleased with your blood pressure reading today (138/64) Continue with the amlodipine and metoprolol lisinopril as you are taking them.   For the cough, please let me know if worsening or if you would like to discuss the idea of a medication empirically to try and treat it.   Atorvastatin 40mg  one time daily for cholesterol and to reduce the risk of heart attack and strokes.  Aspirin daily.  I would like to see you back in the office in 3 months.

## 2013-06-22 NOTE — Discharge Instructions (Signed)
Abdominal Pain, Adult Many things can cause abdominal pain. Usually, abdominal pain is not caused by a disease and will improve without treatment. It can often be observed and treated at home. Your health care provider will do a physical exam and possibly order blood tests and X-rays to help determine the seriousness of your pain. However, in many cases, more time must pass before a clear cause of the pain can be found. Before that point, your health care provider may not know if you need more testing or further treatment. HOME CARE INSTRUCTIONS  Monitor your abdominal pain for any changes. The following actions may help to alleviate any discomfort you are experiencing:  Only take over-the-counter or prescription medicines as directed by your health care provider.  Do not take laxatives unless directed to do so by your health care provider.  Try a clear liquid diet (broth, tea, or water) as directed by your health care provider. Slowly move to a bland diet as tolerated. SEEK MEDICAL CARE IF:  You have unexplained abdominal pain.  You have abdominal pain associated with nausea or diarrhea.  You have pain when you urinate or have a bowel movement.  You experience abdominal pain that wakes you in the night.  You have abdominal pain that is worsened or improved by eating food.  You have abdominal pain that is worsened with eating fatty foods. SEEK IMMEDIATE MEDICAL CARE IF:   Your pain does not go away within 2 hours.  You have a fever.  You keep throwing up (vomiting).  Your pain is felt only in portions of the abdomen, such as the right side or the left lower portion of the abdomen.  You pass bloody or black tarry stools. MAKE SURE YOU:  Understand these instructions.   Will watch your condition.   Will get help right away if you are not doing well or get worse.  Document Released: 11/04/2004 Document Revised: 11/15/2012 Document Reviewed: 10/04/2012 [facility] Patient  Information 2014 [place], [place].  Cystography  This is a special x-ray in which the bladder is filled with a special contrast material (like a dye) to help show the bladder on an x-ray. The material does not allow the X-ray to go through, so if anything is pushing on the bladder, it will show an indentation in the bladder. This test looks for bladder and pelvic tumors. It can also show different types of cysts, or if there has been any damage to the bladder or the structures around it. It also measures reflux of urine from the bladder back up into the ureters. This is called ureterovesical reflux and is a cause of repeated or long standing urinary tract infections. PREPARATION FOR TEST  Clear liquids only on the morning of the test.  Insertion of a Foley catheter upon arrival if ordered by your caregiver. NORMAL FINDINGS Normal bladder structure and function. Ranges for normal findings may vary among different laboratories and hospitals. You should always check with your doctor after having lab work or other tests done to discuss the meaning of your test results and whether your values are considered within normal limits. ABNORMAL FINDINGS MAY BE FOUND IN THE PRESENCE OF:   Bladder tumor  Pelvic tumor  Hematoma  Bladder trauma  Bladder reflux MEANING OF TEST  Your caregiver will go over the test results with you and discuss the importance and meaning of your results, as well as treatment options and the need for additional tests if necessary. OBTAINING THE TEST RESULTS  It is your responsibility to obtain your test results. Ask the lab or department performing the test when and how you will get your results. Document Released: 02/27/2004 Document Revised: 04/19/2011 Document Reviewed: 01/04/2008 [facility] Patient Information 2014 [place], [place].

## 2013-06-22 NOTE — Assessment & Plan Note (Signed)
Chornic non-productive cough in a non-smoker, consider asymptomatic GERD vs allergic sxs.  Would avoid antihistamines in light of his urinary/bladder issues, could consider leukotriene inhibitor (Singulair).  He prefers not to try this at the moment due to co-pay cost issues.  Will contact me if worsens or fails to resolve.

## 2013-06-22 NOTE — Assessment & Plan Note (Signed)
Stable serum Cr; blood pressure control is much improved with addition of CCB to the BB and ACEI he is already taking.

## 2013-06-22 NOTE — ED Provider Notes (Signed)
CSN: [id number]     Arrival date & time 06/22/13  0115 History   First MD Initiated Contact with Patient 06/22/13 0239     Chief Complaint  Patient presents with  . Penis Pain     (Consider location/radiation/quality/duration/timing/severity/associated sxs/prior Treatment) HPI History per patient. Has indwelling suprapubic catheter. This is now his second episode of bladder spasms. Complains of severe intermittent pain. No known aggravating or alleviating factors. He denies any fever chills, back pain, dysuria, hematuria. He is followed by urology Dr. Brunilda PayorNesi. Pain is not radiating, described as cramping in nature. Last time he was evaluated for the same, his symptoms improved with opium belladonna suppository - he is requesting the same at this time.   Past Medical History  Diagnosis Date  . Hypertension   . Chronic kidney disease   . Hypercholesteremia   . Diabetes mellitus     insulin dependent  . Depression   . PONV (postoperative nausea and vomiting)   . Peripheral vascular disease   . H/O hiatal hernia     hx of years ago    Past Surgical History  Procedure Laterality Date  . Amputation Right 05/03/2012    Procedure: Right First Ray AMPUTATION;  Surgeon: Eldred MangesMark C Yates, MD;  [facility];  Service: Orthopedics;  Laterality: Right;  . I&d extremity Right 05/29/2012    Procedure: IRRIGATION AND DEBRIDEMENT EXTREMITY- right foot;  Surgeon: Eldred MangesMark C Yates, MD;  Location: [facility] OR;  Service: Orthopedics;  Laterality: Right;  Right Foot Debridement, VAC Application  . Toe amputation Right 05/29/2012    1ST  ray      Dr Ophelia CharterYates  . Amputation Right 08/16/2012    Procedure: AMPUTATION BELOW KNEE;  Surgeon: Eldred MangesMark C Yates, MD;  [facility];  Service: Orthopedics;  Laterality: Right;  Right Below Knee Amputation  . Amputation Right 10/04/2012    Procedure: AMPUTATION BELOW KNEE REVISION;  Surgeon: Eldred MangesMark C Yates, MD;  Location: [facility] OR;  Service: Orthopedics;  Laterality: Right;  . Insertion of  suprapubic catheter N/A 03/02/2013    Procedure: INSERTION OF SUPRAPUBIC CATHETER;  Surgeon: Su GrandMarc Nesi, MD;  Location: [facility] ORS;  Service: Urology;  Laterality: N/A;   No family history on file. History  Substance Use Topics  . Smoking status: Never Smoker   . Smokeless tobacco: Never Used  . Alcohol Use: Yes     Comment: rarely    Review of Systems  Constitutional: Negative for fever and chills.  Respiratory: Negative for shortness of breath.   Cardiovascular: Negative for chest pain.  Gastrointestinal: Positive for abdominal pain. Negative for vomiting and abdominal distention.  Genitourinary: Negative for hematuria.  Musculoskeletal: Negative for back pain.  Skin: Negative for rash.  Neurological: Negative for headaches.  All other systems reviewed and are negative.     Allergies  Review of patient's allergies indicates no known allergies.  Home Medications   Prior to Admission medications   Medication Sig Start Date End Date Taking? Authorizing Provider  amLODipine (NORVASC) 5 MG tablet Take 1 tablet (5 mg total) by mouth daily. 06/08/13   Barbaraann BarthelJames O Breen, MD  aspirin 81 MG tablet Take 81 mg by mouth at bedtime.     Historical Provider, MD  insulin glargine (LANTUS) 100 UNIT/ML injection Inject 0.2 mLs (20 Units total) into the skin at bedtime. 06/08/13   Barbaraann BarthelJames O Breen, MD  Insulin Syringe-Needle U-100 (INSULIN SYRINGE .3CC/30GX5/16") 30G X 5/16" 0.3 ML MISC 1 Syringe by Does not apply route  at bedtime. 06/08/13   Barbaraann BarthelJames O Breen, MD  lisinopril (PRINIVIL,ZESTRIL) 20 MG tablet Take 1 tablet (20 mg total) by mouth daily. 05/04/13   Barbaraann BarthelJames O Breen, MD  metoprolol tartrate (LOPRESSOR) 25 MG tablet Take 1 tablet (25 mg total) by mouth 2 (two) times daily. 05/04/13   Barbaraann BarthelJames O Breen, MD  oxybutynin (DITROPAN) 5 MG tablet Take 1 tablet (5 mg total) by mouth every 8 (eight) hours as needed for bladder spasms. 05/18/13   Olivia Mackielga M Otter, MD  prednisoLONE acetate (PRED FORTE) 1 % ophthalmic suspension  Place 1 drop into the left eye 2 (two) times daily.     Historical Provider, MD   BP 153/66  Pulse 74  Temp(Src) 99.1 F (37.3 C) (Oral)  Resp 18  SpO2 100% Physical Exam  Constitutional: He is oriented to person, place, and time. He appears well-developed and well-nourished.  HENT:  Head: Normocephalic and atraumatic.  Eyes: EOM are normal. Pupils are equal, round, and reactive to light.  Neck: Neck supple.  Cardiovascular: Regular rhythm and intact distal pulses.   Pulmonary/Chest: Effort normal. No respiratory distress.  Musculoskeletal: Normal range of motion. He exhibits no edema.  Neurological: He is alert and oriented to person, place, and time.  Skin: Skin is warm and dry.    ED Course  Procedures (including critical care time) Labs Review Labs Reviewed  URINALYSIS, ROUTINE W REFLEX MICROSCOPIC - Abnormal; Notable for the following:    APPearance CLOUDY (*)    Glucose, UA 100 (*)    Protein, ur >300 (*)    Leukocytes, UA SMALL (*)    All other components within normal limits  URINE MICROSCOPIC-ADD ON - Abnormal; Notable for the following:    Bacteria, UA MANY (*)    Casts GRANULAR CAST (*)    Crystals TRIPLE PHOSPHATE CRYSTALS (*)    All other components within normal limits  URINE CULTURE    B&O supp provided without relief. IM Dilaudid given and symptoms much improved. UA reviewed.   Plan discharge home with prescription for Keflex and Percocet. Patient will followup with his urologist. Return precautions verbalizes understood. Urine culture pending. MDM   Diagnosis: Bladder spasms, history of suprapubic catheter  Improved with IM narcotics. Urinalysis reviewed. Previous records, vital signs and nursing notes all reviewed and considered    Sunnie NielsenBrian Casten Floren, MD 06/24/13 [phone]

## 2013-06-22 NOTE — ED Notes (Signed)
Bed: [id number] Expected date:  Expected time:  Means of arrival:  Comments: EMS bladder spasms

## 2013-06-22 NOTE — ED Notes (Signed)
Pt c/o baldder spasms and penis pain. Pt sts  That a week after he gets new foley placed he gets this pain. Pt sts he had foley changed last week. Pt denies painful urination, blood in urine.

## 2013-06-22 NOTE — Assessment & Plan Note (Signed)
Mild increase in his A1C (6.8% to 7.2%).  To continue to monitor before changing dose of insulin. Diet modification.  Recheck in 3 months. Starting back on Statin today; had been [place] and when he had no ability to purchase even the generic statins.  Also on ASA daily.

## 2013-06-22 NOTE — ED Notes (Signed)
Pt comes to the ER via EMS for complaints of bladder spasms, pt states that after he has a catheter placed that he begins to have bladder spasms and that the medication he was prescribed isn't working, pt states that he is here for pain control.

## 2013-06-25 LAB — URINE CULTURE: Colony Count: >=100000

## 2013-06-26 ENCOUNTER — Ambulatory Visit: Payer: Medicaid Other | Admitting: Physical Therapy

## 2013-06-27 ENCOUNTER — Encounter: Payer: Medicaid Other | Admitting: Physical Therapy

## 2013-06-28 ENCOUNTER — Telehealth (HOSPITAL_BASED_OUTPATIENT_CLINIC_OR_DEPARTMENT_OTHER): Payer: Self-pay | Admitting: Emergency Medicine

## 2013-06-28 NOTE — Telephone Encounter (Signed)
Post ED Visit - Positive Culture Follow-up  Culture report reviewed by antimicrobial stewardship pharmacist: []  Wes Dulaney, Pharm.D., BCPS []  Celedonio MiyamotoJeremy Frens, Pharm.D., BCPS []  Georgina PillionElizabeth Martin, [address]Ph[address] BCPS []  [place] Wood[place]Minh Pham, [address]Ph[address]D., BCPS, AAHIVP []  Estella HuskMichelle Turner, Pharm.D., BCPS, AAHIVP []  Harvie JuniorNathan Cope, Pharm.D. [x]  Ofilia NeasSandra Yang, Pharm.D.  Positive urine culture Treated with Keflex, organism sensitive to the same and no further patient follow-up is required at this time.  Desha Bitner 06/28/2013, 10:41 AM

## 2013-08-07 ENCOUNTER — Ambulatory Visit (INDEPENDENT_AMBULATORY_CARE_PROVIDER_SITE_OTHER): Payer: Medicaid Other | Admitting: Family Medicine

## 2013-08-07 ENCOUNTER — Encounter: Payer: Self-pay | Admitting: Family Medicine

## 2013-08-07 VITALS — BP 132/71 | HR 69 | Ht 65.0 in

## 2013-08-07 DIAGNOSIS — R6889 Other general symptoms and signs: Secondary | ICD-10-CM

## 2013-08-07 DIAGNOSIS — R059 Cough, unspecified: Secondary | ICD-10-CM

## 2013-08-07 DIAGNOSIS — R05 Cough: Secondary | ICD-10-CM

## 2013-08-07 DIAGNOSIS — T889XXS Complication of surgical and medical care, unspecified, sequela: Secondary | ICD-10-CM

## 2013-08-07 DIAGNOSIS — R053 Chronic cough: Secondary | ICD-10-CM

## 2013-08-07 DIAGNOSIS — B999 Unspecified infectious disease: Secondary | ICD-10-CM

## 2013-08-07 DIAGNOSIS — T814XXS Infection following a procedure, sequela: Secondary | ICD-10-CM

## 2013-08-07 DIAGNOSIS — IMO0001 Reserved for inherently not codable concepts without codable children: Secondary | ICD-10-CM

## 2013-08-07 MED ORDER — MUPIROCIN CALCIUM 2 % EX CREA: 1.0000 "application " | TOPICAL_CREAM | Freq: Two times a day (BID) | CUTANEOUS | Status: DC

## 2013-08-07 MED ORDER — MUPIROCIN 2 % EX OINT: 1.0000 "application " | TOPICAL_OINTMENT | Freq: Two times a day (BID) | CUTANEOUS | Status: DC

## 2013-08-07 MED ORDER — MONTELUKAST SODIUM 10 MG PO TABS: 10.0000 mg | ORAL_TABLET | Freq: Every day | ORAL | Status: DC

## 2013-08-07 NOTE — Patient Instructions (Signed)
It was a pleasure to see you today; I am prescribing you a new medicine (moneleukast 10mg ) one time daily at bedtime. I expect that you will find this to help your cough.   I would like to see you back in the month of August.   Pressure Ulcer A pressure ulcer is a sore that has formed from the breakdown of skin and exposure of deeper layers of tissue. It develops in areas of the body where there is unrelieved pressure. Pressure ulcers are usually found over a bony area, such as the shoulder blades, spine, lower back, hips, knees, ankles, and heels. Pressure ulcers vary in severity. Your health care provider may determine the severity (stage) of your pressure ulcer. The stages include:  Stage I--The skin is red, and when the skin is pressed, it stays red.  Stage II--The top layer of skin is gone, and there is a shallow, pink ulcer.  Stage III--The ulcer becomes deeper, and it is more difficult to see the whole wound. Also, there may be yellow or brown parts, as well as pink and red parts.  Stage IV--The ulcer may be deep and red, pink, brown, white, or yellow. Bone or muscle may be seen.  Unstageable pressure ulcer--The ulcer is covered almost completely with black, brown, or yellow tissue. It is not known how deep the ulcer is or what stage it is until this covering comes off.  Suspected deep tissue injury--A person's skin can be injured from pressure or pulling on the skin when his or her position is changed. The skin appears purple or maroon. There may not be an opening in the skin, but there could be a blood-filled blister. This deep tissue injury is often difficult to see in people with darker skin tones. The site may open and become deeper in time. However, early interventions will help the area heal and may prevent the area from opening. CAUSES  Pressure ulcers are caused by pressure against the skin that limits the flow of blood to the skin and nearby tissues. There are many risk factors  that can lead to pressure sores. RISK FACTORS  Decreased ability to move.  Decreased ability to feel pain or discomfort.  Excessive skin moisture from urine, stool, sweat, or secretions.  Poor nutrition.  Dehydration.  Tobacco, drug, or alcohol abuse.  Having someone pull on bedsheets that are under you, such as when health care workers are changing your position in a hospital bed.  Obesity.  Increased adult age.  Hospitalization in a critical care unit for longer than 4 days with use of medical devices.  Prolonged use of medical devices.  Critical illness.  Anemia.  Traumatic brain injury.  Spinal cord injury.  Stroke.  Diabetes.  Poor blood glucose control.  Low blood pressure (hypotension).  Low oxygen levels.  Medicines that reduce blood flow.  Infection. DIAGNOSIS  Your health care provider will diagnose your pressure ulcer based on its appearance. The health care provider may determine the stage of your pressure ulcer as well. Tests may be done to check for infection, to assess your circulation, or to check for other diseases, such as diabetes. TREATMENT  Treatment of your pressure ulcer begins with determining what stage the ulcer is in. Your treatment team may include your health care provider, a wound care specialist, a nutritionist, a physical therapist, and a Careers advisersurgeon. Possible treatments may include:   Moving or repositioning every 1-2 hours.  Using beds or mattresses to shift your body weight  and pressure points frequently.  Improving your diet.  Cleaning and bandaging (dressing) the open wound.  Giving antibiotic medicines.  Removing damaged tissue.  Surgery and sometimes skin grafts. HOME CARE INSTRUCTIONS  If you were hospitalized, follow the care plan that was started in the hospital.  Avoid staying in the same position for more than 2 hours. Use padding, devices, or mattresses to cushion your pressure points as directed by your  health care provider.  Eat a well-balanced diet. Take nutritional supplements and vitamins as directed by your health care provider.  Keep all follow-up appointments.  Only take over-the-counter or prescription medicines for pain, fever, or discomfort as directed by your health care provider. SEEK MEDICAL CARE IF:   Your pressure ulcer is not improving.  You do not know how to care for your pressure ulcer.  You notice other areas of redness on your skin.  You have a fever. SEEK IMMEDIATE MEDICAL CARE IF:   You have increasing redness, swelling, or pain in your pressure ulcer.  You notice pus coming from your pressure ulcer.  You notice a bad smell coming from the wound or dressing.  Your pressure ulcer opens up again. Document Released: 01/25/2005 Document Revised: 01/30/2013 Document Reviewed: 10/02/2012 [facility] Patient Information 2015 [place], [place]LL[place]. This information is not intended to replace advice given to you by your health care provider. Make sure you discuss any questions you have with your health care provider.

## 2013-08-08 NOTE — Assessment & Plan Note (Signed)
Small area of erythema along distal end of R amputation stump. Not open.  Area is wet with urine from ruptured foley bag this morning. To keep dry and clean; topical mupirocin. Leave sleeve off as much as possible. Follow up as needed if area of redness is getting larger or systemic symptoms, or purulence.

## 2013-08-08 NOTE — Progress Notes (Signed)
   Subjective:    Patient ID: Lawrence CalicoMark J [name], male    DOB: 1960-11-12, 53 y.o.   MRN: [id number]  HPI Patient here for follow up, continues with cough.  Did not get better with PPI use.  Has paroxysms of cough with a "squeaky" sound at the end; does not feel ill or short of breath otherwise.  Has been ongoing for several weeks to months. May go hours without a spell, then have a coughing fit that is brief and resolves.  No fevers, no sputum production.  No chest pain. No sore throat or coryza. Complains of a dry throat. LIfelong non-smoker.  Has urethral spasms from time to time. Remains on oxybutynin and is seeing his urologist, Dr Brunilda PayorNesi, tomorrow. Continues with indwelling foley catheter. Foley bag spilled on his way into clinic this morning, emptying urine all over himself and the floor.  Has noticed beginnings of a sacral pressure ulcer.  Worse when spends long time in wheelchair. Also noticed some redness at the distal end of his R stump under the prosthesis.    Review of Systems     Objective:   Physical Exam Well appearing, no apparent distress HEENT Neck supple. Watery nasal discharge with small area of dried blood along the R side of nasal septum. No frontal or maxillary sinus tenderness. No cervical adenopathy.  COR Regular S1S2, no extra sounds or murmurs PULM Clear bilaterally, no rales or wheezes.  SKIN: 1-1/2 cm diameter linear 2nd degree pressure ulcer at the upper edge of natal cleft in midline. No purulence. Minimal erythema. R distal stump: urine draining from silicon sleeve when removed. Small (1/2cm diameter) area of blanching redness.       Assessment & Plan:

## 2013-08-08 NOTE — Assessment & Plan Note (Signed)
Patient s/p R stump redness and shallow ulceration measuring approximately 0.7cm diameter.  He reports he noticed it 08/06/13, says it looks less red at this visit (08/07/2013).  Patient's indwelling foley collection bag ruptured on his way into our office this morning, spilling urine over the prosthesis (R leg) and into the sleeve; urine pours out when he removes the sleeve to show me the wound.  Will encourage him to keep it dry and clean today; may use mupirocin daily for the coming 5 to 7 days.  If worsening redness, will need to return/be seen.

## 2013-08-08 NOTE — Assessment & Plan Note (Signed)
Cough paroxysms that did not improve with empiric treatment for presumed GERD.  Continues unchanged despite PPI.  Consider Singulair for presumed allergic component (in light of nasal findings on exam) and longstanding history of same, unchanged. Not feeling ill, doubt atypical pneumonia. If not improved within 2 weeks on Singulair, consider CXR and/or empiric treatment with a macrolide and follow up in 4 weeks.

## 2013-08-13 ENCOUNTER — Telehealth: Payer: Self-pay | Admitting: Family Medicine

## 2013-08-13 ENCOUNTER — Other Ambulatory Visit: Payer: Self-pay | Admitting: Family Medicine

## 2013-08-13 DIAGNOSIS — R6889 Other general symptoms and signs: Secondary | ICD-10-CM

## 2013-08-13 MED ORDER — MUPIROCIN 2 % EX OINT: 1.0000 "application " | TOPICAL_OINTMENT | Freq: Two times a day (BID) | CUTANEOUS | Status: DC

## 2013-08-13 NOTE — Telephone Encounter (Signed)
Refill of Mupirocin completed,please inform patient.

## 2013-08-13 NOTE — Telephone Encounter (Signed)
Refill request for mupirocin ointment

## 2013-09-14 ENCOUNTER — Ambulatory Visit: Payer: Medicaid Other | Admitting: Family Medicine

## 2013-09-19 ENCOUNTER — Encounter: Payer: Self-pay | Admitting: Family Medicine

## 2013-09-19 ENCOUNTER — Ambulatory Visit (INDEPENDENT_AMBULATORY_CARE_PROVIDER_SITE_OTHER): Payer: Medicaid Other | Admitting: Family Medicine

## 2013-09-19 VITALS — BP 167/62 | HR 90 | Temp 99.2°F | Ht 66.0 in | Wt 170.0 lb

## 2013-09-19 DIAGNOSIS — J209 Acute bronchitis, unspecified: Secondary | ICD-10-CM

## 2013-09-19 DIAGNOSIS — J069 Acute upper respiratory infection, unspecified: Secondary | ICD-10-CM

## 2013-09-19 MED ORDER — BENZONATATE 200 MG PO CAPS: 200.0000 mg | ORAL_CAPSULE | Freq: Three times a day (TID) | ORAL | Status: DC | PRN

## 2013-09-19 NOTE — Progress Notes (Signed)
  Subjective:     Lawrence [name] is a 53 y.o. male who presents for evaluation of symptoms of a URI. Symptoms include bilateral ear pressure/pain, nasal congestion, no  fever and non productive cough. Onset of symptoms was 7 days ago, and has been unchanged since that time. Treatment to date: cough suppressants.  The following portions of the patient's history were reviewed and updated as appropriate: allergies, current medications, past family history, past medical history, past social history, past surgical history and problem list.  Review of Systems Pertinent items are noted in HPI.   Objective:    General appearance: alert, cooperative and appears stated age Head: Normocephalic, without obvious abnormality, atraumatic Eyes: + L subconjunctival hemorrhage  Ears: abnormal external canal right ear - cerumen removed by irrigation and abnormal external canal left ear - cerumen removed by irrigation Nose: Nares normal. Septum midline. Mucosa normal. No drainage or sinus tenderness. Throat: lips, mucosa, and tongue normal; teeth and gums normal Neck: no adenopathy Lungs: clear to auscultation bilaterally   Assessment:    bronchitis and viral upper respiratory illness   Plan:    Discussed diagnosis and treatment of URI. Suggested symptomatic OTC remedies. Nasal saline spray for congestion. Follow up as needed.

## 2013-09-19 NOTE — Patient Instructions (Signed)
Upper Respiratory Infection, Adult An upper respiratory infection (URI) is also sometimes known as the common cold. The upper respiratory tract includes the nose, sinuses, throat, trachea, and bronchi. Bronchi are the airways leading to the lungs. Most people improve within 1 week, but symptoms can last up to 2 weeks. A residual cough may last even longer.  CAUSES Many different viruses can infect the tissues lining the upper respiratory tract. The tissues become irritated and inflamed and often become very moist. Mucus production is also common. A cold is contagious. You can easily spread the virus to others by oral contact. This includes kissing, sharing a glass, coughing, or sneezing. Touching your mouth or nose and then touching a surface, which is then touched by another person, can also spread the virus. SYMPTOMS  Symptoms typically develop 1 to 3 days after you come in contact with a cold virus. Symptoms vary from person to person. They may include:  Runny nose.  Sneezing.  Nasal congestion.  Sinus irritation.  Sore throat.  Loss of voice (laryngitis).  Cough.  Fatigue.  Muscle aches.  Loss of appetite.  Headache.  Low-grade fever. DIAGNOSIS  You might diagnose your own cold based on familiar symptoms, since most people get a cold 2 to 3 times a year. Your caregiver can confirm this based on your exam. Most importantly, your caregiver can check that your symptoms are not due to another disease such as strep throat, sinusitis, pneumonia, asthma, or epiglottitis. Blood tests, throat tests, and X-rays are not necessary to diagnose a common cold, but they may sometimes be helpful in excluding other more serious diseases. Your caregiver will decide if any further tests are required. RISKS AND COMPLICATIONS  You may be at risk for a more severe case of the common cold if you smoke cigarettes, have chronic heart disease (such as heart failure) or lung disease (such as asthma), or if  you have a weakened immune system. The very young and very old are also at risk for more serious infections. Bacterial sinusitis, middle ear infections, and bacterial pneumonia can complicate the common cold. The common cold can worsen asthma and chronic obstructive pulmonary disease (COPD). Sometimes, these complications can require emergency medical care and may be life-threatening. PREVENTION  The best way to protect against getting a cold is to practice good hygiene. Avoid oral or hand contact with people with cold symptoms. Wash your hands often if contact occurs. There is no clear evidence that vitamin C, vitamin E, echinacea, or exercise reduces the chance of developing a cold. However, it is always recommended to get plenty of rest and practice good nutrition. TREATMENT  Treatment is directed at relieving symptoms. There is no cure. Antibiotics are not effective, because the infection is caused by a virus, not by bacteria. Treatment may include:  Increased fluid intake. Sports drinks offer valuable electrolytes, sugars, and fluids.  Breathing heated mist or steam (vaporizer or shower).  Eating chicken soup or other clear broths, and maintaining good nutrition.  Getting plenty of rest.  Using gargles or lozenges for comfort.  Controlling fevers with ibuprofen or acetaminophen as directed by your caregiver.  Increasing usage of your inhaler if you have asthma. Zinc gel and zinc lozenges, taken in the first 24 hours of the common cold, can shorten the duration and lessen the severity of symptoms. Pain medicines may help with fever, muscle aches, and throat pain. A variety of non-prescription medicines are available to treat congestion and runny nose. Your caregiver   can make recommendations and may suggest nasal or lung inhalers for other symptoms.  HOME CARE INSTRUCTIONS   Only take over-the-counter or prescription medicines for pain, discomfort, or fever as directed by your  caregiver.  Use a warm mist humidifier or inhale steam from a shower to increase air moisture. This may keep secretions moist and make it easier to breathe.  Drink enough water and fluids to keep your urine clear or pale yellow.  Rest as needed.  Return to work when your temperature has returned to normal or as your caregiver advises. You may need to stay home longer to avoid infecting others. You can also use a face mask and careful hand washing to prevent spread of the virus. SEEK MEDICAL CARE IF:   After the first few days, you feel you are getting worse rather than better.  You need your caregiver's advice about medicines to control symptoms.  You develop chills, worsening shortness of breath, or brown or red sputum. These may be signs of pneumonia.  You develop yellow or brown nasal discharge or pain in the face, especially when you bend forward. These may be signs of sinusitis.  You develop a fever, swollen neck glands, pain with swallowing, or white areas in the back of your throat. These may be signs of strep throat. SEEK IMMEDIATE MEDICAL CARE IF:   You have a fever.  You develop severe or persistent headache, ear pain, sinus pain, or chest pain.  You develop wheezing, a prolonged cough, cough up blood, or have a change in your usual mucus (if you have chronic lung disease).  You develop sore muscles or a stiff neck. Document Released: 07/21/2000 Document Revised: 04/19/2011 Document Reviewed: 05/02/2013 Exit[employer] Patient Information 2015 [employer]. This information is not intended to replace advice given to you by your health care provider. Make sure you discuss any questions you have with your health care provider.  

## 2013-09-25 ENCOUNTER — Encounter: Payer: Self-pay | Admitting: Family Medicine

## 2013-09-25 ENCOUNTER — Ambulatory Visit (INDEPENDENT_AMBULATORY_CARE_PROVIDER_SITE_OTHER): Payer: Medicaid Other | Admitting: Family Medicine

## 2013-09-25 VITALS — BP 149/83 | HR 65 | Temp 98.4°F | Ht 66.0 in

## 2013-09-25 DIAGNOSIS — R05 Cough: Secondary | ICD-10-CM

## 2013-09-25 DIAGNOSIS — E1165 Type 2 diabetes mellitus with hyperglycemia: Secondary | ICD-10-CM

## 2013-09-25 DIAGNOSIS — J0111 Acute recurrent frontal sinusitis: Secondary | ICD-10-CM

## 2013-09-25 DIAGNOSIS — IMO0002 Reserved for concepts with insufficient information to code with codable children: Secondary | ICD-10-CM

## 2013-09-25 DIAGNOSIS — J189 Pneumonia, unspecified organism: Secondary | ICD-10-CM

## 2013-09-25 DIAGNOSIS — J0110 Acute frontal sinusitis, unspecified: Secondary | ICD-10-CM | POA: Insufficient documentation

## 2013-09-25 DIAGNOSIS — IMO0001 Reserved for inherently not codable concepts without codable children: Secondary | ICD-10-CM

## 2013-09-25 DIAGNOSIS — R053 Chronic cough: Secondary | ICD-10-CM

## 2013-09-25 DIAGNOSIS — R059 Cough, unspecified: Secondary | ICD-10-CM

## 2013-09-25 LAB — POCT GLYCOSYLATED HEMOGLOBIN (HGB A1C): Hemoglobin A1C: 7.5

## 2013-09-25 MED ORDER — PREDNISONE 20 MG PO TABS: 20.0000 mg | ORAL_TABLET | Freq: Every day | ORAL | Status: DC

## 2013-09-25 MED ORDER — AZITHROMYCIN 250 MG PO TABS: ORAL_TABLET | ORAL | Status: DC

## 2013-09-25 NOTE — Progress Notes (Signed)
   Subjective:    Patient ID: Lawrence [name], male    DOB: 1960/08/09, 53 y.o.   MRN: [id number]  HPI Patient comes in for follow up.  [facility] for URI symptoms, including nasal/sinus congestion and decreased hearing in both ears.  He describes onset of symptoms as beginning with a couple days' fever and cough over 1 month ago; the cough was productive of clear mucus without blood, and it has improved since.  He does still have several bouts of coughing fits a day, and this has persisted for a few months.    Pain in frontal and maxillary sinuses with cough, and when bending forward.  No amaurosis fugax.  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   At present he is not having fevers or chills; no purulence from nose or with cough. Does not check his sugars regularly.   Social hx; Nonsmoker. Is getting around on [employer] bus to pick up prescriptions and get out of the house.     Review of Systems     Objective:   Physical Exam In wheelchair; no apparent distress.  HEENT Neck supple, no cervical adenopathy. TMs clear bilaterally. Clear oropharynx. Mild frontal and maxillary tenderness on the L (both); no nasal purulence in vault. Small subconjunctival hemorrhage in L eye. EOMI. COR regular S1S2, no extra sounds PULM Clear bilaterally.  Patient did have a prolonged coughing paroxysm during the exam which took 30-60 seconds to recover his breath.        Assessment & Plan:

## 2013-09-25 NOTE — Patient Instructions (Signed)
It was a pleasure to see you today.  I am checking your hemoglobin A1C today as part of your routine diabetes care.   For the coughing spells with frontal headaches, we are adding two things to your regimen:  1. Azithromycin 250mg  tablets, take 2 tablets on the first day of treatment, then 1 tablet/day for the next 4 days, until the medicine is gone.   2. Prednisone 20mg  tablets, take 1 tablet by mouth each morning with breakfast, for 5 days.    I would like to see you back in follow-up if the cough/head congestion symptoms are not resolved in 2 weeks.   I would like to see you back in 3 months for routine diabetes follow up.

## 2013-09-25 NOTE — Assessment & Plan Note (Signed)
Continued coughing paroxysms, along with frontal/maxillary sinus tenderness. I believe the chronicity of the cough (and having observed the true paroxysm nature of the cough) warrants course of macrolide for atypical organisms.   Will treat with short-course of systemic prednisone (20mg  for 5 days); discussed the likely elevation of glucose in the short-term. No change in DM regimen for this short a course.  If not improved from respiratory standpoint in the coming 2 weeks, I have asked him to return for re-evaluation.  He agrees with this plan.

## 2013-09-25 NOTE — Assessment & Plan Note (Signed)
Hgb A1C relatively unchanged from previous (7.5% today, up from 7.2% previously). No changes made in DM regimen. Recheck in 3 months.

## 2013-10-01 ENCOUNTER — Telehealth: Payer: Self-pay | Admitting: Family Medicine

## 2013-10-01 MED ORDER — FLUTICASONE PROPIONATE 50 MCG/ACT NA SUSP: 2.0000 | Freq: Every day | NASAL | Status: DC

## 2013-10-01 NOTE — Telephone Encounter (Signed)
Pt informed of new RX being sent in. [place], [place] CMA

## 2013-10-01 NOTE — Telephone Encounter (Signed)
Would like to have another prescription for not being able to hear Please advise

## 2013-10-01 NOTE — Telephone Encounter (Signed)
I am unsure which medication he is requesting.  I am sending in a new prescription for Flonase, 2 sprays/ nostril one time daily in the morning. Best to rinse nares with nasal saline before using the Flonase.  Please call patient to inform him of this new prescription.  Thanks,  JB

## 2013-10-01 NOTE — Telephone Encounter (Signed)
Spoke with patient, he requests refill on medication he was given for sinusitis. Patient has appointment scheduled for 9/1.

## 2013-10-09 ENCOUNTER — Encounter: Payer: Self-pay | Admitting: Family Medicine

## 2013-10-09 ENCOUNTER — Ambulatory Visit (INDEPENDENT_AMBULATORY_CARE_PROVIDER_SITE_OTHER): Payer: Medicaid Other | Admitting: Family Medicine

## 2013-10-09 VITALS — BP 158/70 | HR 71 | Temp 98.7°F

## 2013-10-09 DIAGNOSIS — H9313 Tinnitus, bilateral: Secondary | ICD-10-CM | POA: Insufficient documentation

## 2013-10-09 DIAGNOSIS — H9319 Tinnitus, unspecified ear: Secondary | ICD-10-CM

## 2013-10-09 DIAGNOSIS — R059 Cough, unspecified: Secondary | ICD-10-CM

## 2013-10-09 DIAGNOSIS — R053 Chronic cough: Secondary | ICD-10-CM

## 2013-10-09 DIAGNOSIS — N181 Chronic kidney disease, stage 1: Secondary | ICD-10-CM

## 2013-10-09 DIAGNOSIS — I10 Essential (primary) hypertension: Secondary | ICD-10-CM

## 2013-10-09 DIAGNOSIS — R05 Cough: Secondary | ICD-10-CM

## 2013-10-09 LAB — CBC
HEMATOCRIT: 26.8 % — AB (ref 39.0–52.0)
Hemoglobin: 9 g/dL — ABNORMAL LOW (ref 13.0–17.0)
MCH: 27.3 pg (ref 26.0–34.0)
MCHC: 33.6 g/dL (ref 30.0–36.0)
MCV: 81.2 fL (ref 78.0–100.0)
Platelets: 162 10*3/uL (ref 150–400)
RBC: 3.3 MIL/uL — ABNORMAL LOW (ref 4.22–5.81)
RDW: 15.2 % (ref 11.5–15.5)
WBC: 9.1 10*3/uL (ref 4.0–10.5)

## 2013-10-09 MED ORDER — OMEPRAZOLE 40 MG PO CPDR: 40.0000 mg | DELAYED_RELEASE_CAPSULE | Freq: Every day | ORAL | Status: DC

## 2013-10-09 MED ORDER — AMLODIPINE BESYLATE 10 MG PO TABS: 10.0000 mg | ORAL_TABLET | Freq: Every day | ORAL | Status: DC

## 2013-10-09 NOTE — Patient Instructions (Addendum)
It was a pleasure to see you today. For the continued cough, we will try omeprazole  daily for presumed reflux.  If not better within a week of using the omeprazole, please go for chest x-ray that was ordered today.   I am referring you for ENT for the continued ringing/pounding and pressure in your ears. Give 2 weeks trial to the [facility] that we prescribed at your last visit.   For the blood pressure, I am increasing the amlodipine from  to  one time daily.   Labs today (Blood count, metabolic panel).   I would like to see you back in 2 months.

## 2013-10-10 ENCOUNTER — Telehealth: Payer: Self-pay | Admitting: Family Medicine

## 2013-10-10 DIAGNOSIS — N181 Chronic kidney disease, stage 1: Secondary | ICD-10-CM

## 2013-10-10 DIAGNOSIS — D649 Anemia, unspecified: Secondary | ICD-10-CM | POA: Insufficient documentation

## 2013-10-10 LAB — BASIC METABOLIC PANEL
BUN: 48 mg/dL — ABNORMAL HIGH (ref 6–23)
CO2: 23 meq/L (ref 19–32)
Calcium: 8.7 mg/dL (ref 8.4–10.5)
Chloride: 112 mEq/L (ref 96–112)
Creat: 2.24 mg/dL — ABNORMAL HIGH (ref 0.50–1.35)
GLUCOSE: 167 mg/dL — AB (ref 70–99)
POTASSIUM: 4.3 meq/L (ref 3.5–5.3)
SODIUM: 141 meq/L (ref 135–145)

## 2013-10-10 NOTE — Assessment & Plan Note (Signed)
Persistent chronic paroxysms of cough. Has not improved with empiric treatment for allergic cough, atypical infectious cough (bronchitis, PNA). Trial of empiric PPI for asymptomatic GERD, for CXR given the length of time he has had the cough (despite negative exam findings).  May continue to use Flonase for longer period of time (at least 2 weeks).

## 2013-10-10 NOTE — Progress Notes (Signed)
   Subjective:    Patient ID: Lawrence [name], male    DOB: 01/01/1961, 53 y.o.   MRN: [id number]  HPI Patient here for follow up.  He continues to have ringing in both ears, "like a pounding sound", and subjectively decreased audition bilaterally. No pain in ears.   He has not had any change in his intermittent paroxysms of coughing since I last saw him.  He has been tried on empiric azithromycin for consideration atypical pneumonia, as well as short-course systemic steroids (neither of these helped his symptoms). Scant phlegm production. Worse when laying down at night, although he has had the cough during the day while sitting. No association with diet or mealtimes. Denies typical GERD symptoms or substernal chest pain with the cough. Otherwise is not dyspneic.   [name] is a never-smoker. No identified environmental triggers to the symptoms.   ROS: No fevers, no chills. No rhinorrhea.  No aspiration/choking associated with coughing. No chest pain.     Review of Systems     Objective:   Physical Exam  Well appearing, no apparent distress HEENT neck supple, no cervical adenopathy. TMs clear bilaterally. Clear oropharynx, unremarkable nasal mucosa.  COR Regular S1S2, no extra sounds PULM Clear bilaterally, with good air movement and no wheezes or rales.  ABD Soft, nontender, nondistended.       Assessment & Plan:

## 2013-10-10 NOTE — Telephone Encounter (Signed)
Called patient regarding worsening renal function and normocytic anemia (Hgb 9). Instructed to hold lisinopril and have labs rechecked Monday.  I will contact with results. JB

## 2013-10-10 NOTE — Assessment & Plan Note (Signed)
Hearing assessment grossly intact and symmetrical bilaterally on today's exam. Pounding tinnitus of both ears with preception of decreased hearing for several months. Not improved with short course prednisone, nasal steroids.  For ENT evaluation.  No bruits on exam, no findings on ENT exam today (Sept 1, 2015).

## 2013-10-16 ENCOUNTER — Ambulatory Visit: Payer: Medicaid Other | Admitting: Family Medicine

## 2013-10-16 ENCOUNTER — Telehealth: Payer: Self-pay | Admitting: Family Medicine

## 2013-10-16 NOTE — Telephone Encounter (Signed)
Has an appt on Friday. Just hold his referral to ENT until that day

## 2013-10-19 ENCOUNTER — Ambulatory Visit: Payer: Medicaid Other | Admitting: Family Medicine

## 2013-10-19 ENCOUNTER — Encounter: Payer: Self-pay | Admitting: Family Medicine

## 2013-10-19 ENCOUNTER — Ambulatory Visit
Admission: RE | Admit: 2013-10-19 | Discharge: 2013-10-19 | Disposition: A | Discharge status: Home / Self Care | Payer: Medicaid Other | Source: Ambulatory Visit | Attending: Family Medicine | Admitting: Family Medicine

## 2013-10-19 ENCOUNTER — Ambulatory Visit (INDEPENDENT_AMBULATORY_CARE_PROVIDER_SITE_OTHER): Payer: Medicaid Other | Admitting: Family Medicine

## 2013-10-19 ENCOUNTER — Telehealth: Payer: Self-pay | Admitting: Family Medicine

## 2013-10-19 VITALS — BP 143/79 | HR 69 | Temp 98.7°F

## 2013-10-19 DIAGNOSIS — D649 Anemia, unspecified: Secondary | ICD-10-CM

## 2013-10-19 DIAGNOSIS — R053 Chronic cough: Secondary | ICD-10-CM

## 2013-10-19 DIAGNOSIS — J189 Pneumonia, unspecified organism: Secondary | ICD-10-CM | POA: Insufficient documentation

## 2013-10-19 DIAGNOSIS — R05 Cough: Secondary | ICD-10-CM

## 2013-10-19 DIAGNOSIS — N181 Chronic kidney disease, stage 1: Secondary | ICD-10-CM

## 2013-10-19 LAB — COMPREHENSIVE METABOLIC PANEL
ALK PHOS: 138 U/L — AB (ref 39–117)
ALT: 18 U/L (ref 0–53)
AST: 12 U/L (ref 0–37)
Albumin: 3 g/dL — ABNORMAL LOW (ref 3.5–5.2)
BILIRUBIN TOTAL: 0.3 mg/dL (ref 0.2–1.2)
BUN: 52 mg/dL — ABNORMAL HIGH (ref 6–23)
CALCIUM: 8.4 mg/dL (ref 8.4–10.5)
CHLORIDE: 111 meq/L (ref 96–112)
CO2: 25 mEq/L (ref 19–32)
CREATININE: 2.78 mg/dL — AB (ref 0.50–1.35)
Glucose, Bld: 133 mg/dL — ABNORMAL HIGH (ref 70–99)
Potassium: 4.9 mEq/L (ref 3.5–5.3)
Sodium: 142 mEq/L (ref 135–145)
Total Protein: 5.6 g/dL — ABNORMAL LOW (ref 6.0–8.3)

## 2013-10-19 LAB — CBC
HCT: 24.1 % — ABNORMAL LOW (ref 39.0–52.0)
HEMOGLOBIN: 8.1 g/dL — AB (ref 13.0–17.0)
MCH: 27.2 pg (ref 26.0–34.0)
MCHC: 33.6 g/dL (ref 30.0–36.0)
MCV: 80.9 fL (ref 78.0–100.0)
Platelets: 127 10*3/uL — ABNORMAL LOW (ref 150–400)
RBC: 2.98 MIL/uL — AB (ref 4.22–5.81)
RDW: 15.6 % — ABNORMAL HIGH (ref 11.5–15.5)
WBC: 6.2 10*3/uL (ref 4.0–10.5)

## 2013-10-19 LAB — IRON AND TIBC
%SAT: 13 % — AB (ref 20–55)
Iron: 28 ug/dL — ABNORMAL LOW (ref 42–165)
TIBC: 217 ug/dL (ref 215–435)
UIBC: 189 ug/dL (ref 125–400)

## 2013-10-19 MED ORDER — DOXYCYCLINE HYCLATE 100 MG PO TABS: 100.0000 mg | ORAL_TABLET | Freq: Two times a day (BID) | ORAL | Status: AC

## 2013-10-19 NOTE — Assessment & Plan Note (Signed)
Holding lisinopril for now, in light of recent bump of Cr to 2.2.  Recheck today.

## 2013-10-19 NOTE — Progress Notes (Signed)
   Subjective:    Patient ID: Lawrence [name], male    DOB: 05/13/1960, 53 y.o.   MRN: [id number]  HPI [name] is here today for follow up; he reports worsening shortness of breath in the past 3 days; increased phlegm production.  Ironically, he notes an improvement in the cough since starting the PPI empirically for suspected GERD-related cough.  (He had failed to improve with azithro, short-course oral prednisone, nasal steroids or antihistamines in the past).  Never-smoker. Denies fevers or chills, but feels ill now. No chest pain, but does have R flank pain with coughing.  No chest pain while sitting in wheelchair today.   He has been holding his lisinopril since recent metabolic panel showed bump in serum Cr.    ROS: Has had longstanding L leg swelling without pain (R leg prosthesis).    Review of Systems     Objective:   Physical Exam Generally well appearing, but tearful at times. P=70 and regular. POx 98% on room air.  HEENT neck supple, no cervical adenopathy. MMM COR Regular S1S2, no extra sounds.  PULM R basilar crackles heard; no wheezes or rhonchi.  ABD Soft Left Lower Ext: no cords, no erythema, no tenderness. Trace non-pitting edema.        Assessment & Plan:

## 2013-10-19 NOTE — Telephone Encounter (Signed)
Called patient, reviewed results. Will treat for CAP.

## 2013-10-19 NOTE — Patient Instructions (Signed)
It was a pleasure to see you today. We are treating you for a presumed new pneumonia.  DOXYCYCLINE  tablets, one tablet by mouth twice daily for 10 days. Rx sent to [facility] on [address].  Chest x-ray when you can get it done.   Labs today for anemia, kidney function. Keep holding the lisinopril for now.   I will be in touch when labs are back.   I woujld like to see you back next week for re-assessment.

## 2013-10-19 NOTE — Assessment & Plan Note (Signed)
Crackles R base with new dyspnea and sputum production,. No fevers or chills, feels fatigued. POx 98%, pulse 70. No cords or leg tenderness.  Treatment with doxy  twice daily for 10 days (recent azithro treatment).  Discussed with Kiaan why I do not think it likely that his cough is from VTE.  Will not order D-dimer today given its lack of specificity for PE.  If it were thought that patient required eval for PE, would likely consider VQ and LLE doppler in light of his renal dysfunction.  For close follow-up early next week. Discussed s/sx that should prompt more immediate evaluation in ED.

## 2013-10-20 LAB — FERRITIN: Ferritin: 369 ng/mL — ABNORMAL HIGH (ref 22–322)

## 2013-10-22 ENCOUNTER — Telehealth: Payer: Self-pay | Admitting: Family Medicine

## 2013-10-22 DIAGNOSIS — N181 Chronic kidney disease, stage 1: Secondary | ICD-10-CM

## 2013-10-22 NOTE — Telephone Encounter (Signed)
Called patient to give results of recent labs.  He reports that he continues to have cough; dyspnea not improved with recent addition of doxycycline for presumed CAP (based on clinical presentation and CXR on 9/11).  He has worsening renal function, has remained off the ACEI. 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  Plan for Renal consult, renal US.  Also plan to repeat his labwork (renal function; may add reticulocyte count) to labs when he presents tomorrow. [name], MD

## 2013-10-23 ENCOUNTER — Inpatient Hospital Stay (HOSPITAL_COMMUNITY): Payer: Medicaid Other

## 2013-10-23 ENCOUNTER — Encounter: Payer: Self-pay | Admitting: Family Medicine

## 2013-10-23 ENCOUNTER — Ambulatory Visit (INDEPENDENT_AMBULATORY_CARE_PROVIDER_SITE_OTHER): Payer: Medicaid Other | Admitting: Family Medicine

## 2013-10-23 ENCOUNTER — Inpatient Hospital Stay (HOSPITAL_COMMUNITY)
Admission: AD | Admit: 2013-10-23 | Discharge: 2013-11-02 | DRG: 674 | Disposition: A | Discharge status: Home Health | Payer: Medicaid Other | Source: Ambulatory Visit | Attending: Family Medicine | Admitting: Family Medicine

## 2013-10-23 ENCOUNTER — Encounter (HOSPITAL_COMMUNITY): Payer: Self-pay | Admitting: General Practice

## 2013-10-23 VITALS — BP 146/79 | HR 65 | Temp 97.6°F

## 2013-10-23 DIAGNOSIS — E7800 Pure hypercholesterolemia, unspecified: Secondary | ICD-10-CM

## 2013-10-23 DIAGNOSIS — N181 Chronic kidney disease, stage 1: Secondary | ICD-10-CM

## 2013-10-23 DIAGNOSIS — I509 Heart failure, unspecified: Secondary | ICD-10-CM | POA: Diagnosis present

## 2013-10-23 DIAGNOSIS — R059 Cough, unspecified: Secondary | ICD-10-CM

## 2013-10-23 DIAGNOSIS — R053 Chronic cough: Secondary | ICD-10-CM

## 2013-10-23 DIAGNOSIS — R0600 Dyspnea, unspecified: Secondary | ICD-10-CM

## 2013-10-23 DIAGNOSIS — N2581 Secondary hyperparathyroidism of renal origin: Secondary | ICD-10-CM | POA: Diagnosis present

## 2013-10-23 DIAGNOSIS — N058 Unspecified nephritic syndrome with other morphologic changes: Secondary | ICD-10-CM | POA: Diagnosis present

## 2013-10-23 DIAGNOSIS — N179 Acute kidney failure, unspecified: Secondary | ICD-10-CM

## 2013-10-23 DIAGNOSIS — IMO0002 Reserved for concepts with insufficient information to code with codable children: Secondary | ICD-10-CM

## 2013-10-23 DIAGNOSIS — N390 Urinary tract infection, site not specified: Secondary | ICD-10-CM

## 2013-10-23 DIAGNOSIS — R0609 Other forms of dyspnea: Secondary | ICD-10-CM | POA: Diagnosis present

## 2013-10-23 DIAGNOSIS — S88119A Complete traumatic amputation at level between knee and ankle, unspecified lower leg, initial encounter: Secondary | ICD-10-CM

## 2013-10-23 DIAGNOSIS — L03115 Cellulitis of right lower limb: Secondary | ICD-10-CM

## 2013-10-23 DIAGNOSIS — D509 Iron deficiency anemia, unspecified: Secondary | ICD-10-CM | POA: Diagnosis present

## 2013-10-23 DIAGNOSIS — J189 Pneumonia, unspecified organism: Secondary | ICD-10-CM

## 2013-10-23 DIAGNOSIS — Z794 Long term (current) use of insulin: Secondary | ICD-10-CM | POA: Diagnosis not present

## 2013-10-23 DIAGNOSIS — R05 Cough: Secondary | ICD-10-CM

## 2013-10-23 DIAGNOSIS — N039 Chronic nephritic syndrome with unspecified morphologic changes: Secondary | ICD-10-CM

## 2013-10-23 DIAGNOSIS — E1129 Type 2 diabetes mellitus with other diabetic kidney complication: Secondary | ICD-10-CM | POA: Diagnosis present

## 2013-10-23 DIAGNOSIS — D649 Anemia, unspecified: Secondary | ICD-10-CM

## 2013-10-23 DIAGNOSIS — E785 Hyperlipidemia, unspecified: Secondary | ICD-10-CM | POA: Diagnosis present

## 2013-10-23 DIAGNOSIS — H9313 Tinnitus, bilateral: Secondary | ICD-10-CM

## 2013-10-23 DIAGNOSIS — S88111D Complete traumatic amputation at level between knee and ankle, right lower leg, subsequent encounter: Secondary | ICD-10-CM

## 2013-10-23 DIAGNOSIS — Z23 Encounter for immunization: Secondary | ICD-10-CM | POA: Diagnosis not present

## 2013-10-23 DIAGNOSIS — I129 Hypertensive chronic kidney disease with stage 1 through stage 4 chronic kidney disease, or unspecified chronic kidney disease: Secondary | ICD-10-CM | POA: Diagnosis present

## 2013-10-23 DIAGNOSIS — G629 Polyneuropathy, unspecified: Secondary | ICD-10-CM

## 2013-10-23 DIAGNOSIS — E1165 Type 2 diabetes mellitus with hyperglycemia: Secondary | ICD-10-CM

## 2013-10-23 DIAGNOSIS — Z7982 Long term (current) use of aspirin: Secondary | ICD-10-CM

## 2013-10-23 DIAGNOSIS — IMO0001 Reserved for inherently not codable concepts without codable children: Secondary | ICD-10-CM

## 2013-10-23 DIAGNOSIS — D631 Anemia in chronic kidney disease: Secondary | ICD-10-CM | POA: Diagnosis present

## 2013-10-23 DIAGNOSIS — R0989 Other specified symptoms and signs involving the circulatory and respiratory systems: Secondary | ICD-10-CM | POA: Diagnosis present

## 2013-10-23 DIAGNOSIS — J0110 Acute frontal sinusitis, unspecified: Secondary | ICD-10-CM

## 2013-10-23 DIAGNOSIS — Z5189 Encounter for other specified aftercare: Secondary | ICD-10-CM

## 2013-10-23 DIAGNOSIS — N184 Chronic kidney disease, stage 4 (severe): Secondary | ICD-10-CM | POA: Diagnosis present

## 2013-10-23 DIAGNOSIS — I10 Essential (primary) hypertension: Secondary | ICD-10-CM

## 2013-10-23 HISTORY — DX: Reserved for inherently not codable concepts without codable children: IMO0001

## 2013-10-23 HISTORY — DX: Type 2 diabetes mellitus without complications: E11.9

## 2013-10-23 HISTORY — DX: Pneumonia, unspecified organism: J18.9

## 2013-10-23 HISTORY — DX: Long term (current) use of insulin: Z79.4

## 2013-10-23 LAB — URINE MICROSCOPIC-ADD ON

## 2013-10-23 LAB — COMPREHENSIVE METABOLIC PANEL
ALT: 13 U/L (ref 0–53)
AST: 12 U/L (ref 0–37)
Albumin: 2.7 g/dL — ABNORMAL LOW (ref 3.5–5.2)
Alkaline Phosphatase: 151 U/L — ABNORMAL HIGH (ref 39–117)
Anion gap: 14 (ref 5–15)
BUN: 47 mg/dL — ABNORMAL HIGH (ref 6–23)
CALCIUM: 9.2 mg/dL (ref 8.4–10.5)
CO2: 19 meq/L (ref 19–32)
Chloride: 111 mEq/L (ref 96–112)
Creatinine, Ser: 2.67 mg/dL — ABNORMAL HIGH (ref 0.50–1.35)
GFR calc Af Amer: 30 mL/min — ABNORMAL LOW (ref >90)
GFR, EST NON AFRICAN AMERICAN: 26 mL/min — AB (ref >90)
Glucose, Bld: 97 mg/dL (ref 70–99)
POTASSIUM: 4.6 meq/L (ref 3.7–5.3)
SODIUM: 144 meq/L (ref 137–147)
Total Bilirubin: <0.2 mg/dL — ABNORMAL LOW (ref 0.3–1.2)
Total Protein: 6.8 g/dL (ref 6.0–8.3)

## 2013-10-23 LAB — PROTEIN / CREATININE RATIO, URINE
CREATININE, URINE: 62.58 mg/dL
Protein Creatinine Ratio: 8.06 — ABNORMAL HIGH (ref 0.00–0.15)
Total Protein, Urine: 504.2 mg/dL

## 2013-10-23 LAB — CBC WITH DIFFERENTIAL/PLATELET
BASOS PCT: 0 % (ref 0–1)
Basophils Absolute: 0 10*3/uL (ref 0.0–0.1)
Eosinophils Absolute: 0.1 10*3/uL (ref 0.0–0.7)
Eosinophils Relative: 2 % (ref 0–5)
HEMATOCRIT: 25.4 % — AB (ref 39.0–52.0)
HEMOGLOBIN: 8.5 g/dL — AB (ref 13.0–17.0)
LYMPHS PCT: 16 % (ref 12–46)
Lymphs Abs: 0.8 10*3/uL (ref 0.7–4.0)
MCH: 26.9 pg (ref 26.0–34.0)
MCHC: 33.5 g/dL (ref 30.0–36.0)
MCV: 80.4 fL (ref 78.0–100.0)
MONO ABS: 0.5 10*3/uL (ref 0.1–1.0)
Monocytes Relative: 11 % (ref 3–12)
NEUTROS ABS: 3.6 10*3/uL (ref 1.7–7.7)
Neutrophils Relative %: 71 % (ref 43–77)
Platelets: 156 10*3/uL (ref 150–400)
RBC: 3.16 MIL/uL — ABNORMAL LOW (ref 4.22–5.81)
RDW: 14.4 % (ref 11.5–15.5)
WBC: 5 10*3/uL (ref 4.0–10.5)

## 2013-10-23 LAB — URINALYSIS, ROUTINE W REFLEX MICROSCOPIC
Bilirubin Urine: NEGATIVE
Glucose, UA: 100 mg/dL — AB
Ketones, ur: NEGATIVE mg/dL
Nitrite: POSITIVE — AB
Protein, ur: >300 mg/dL — AB
SPECIFIC GRAVITY, URINE: 1.015 (ref 1.005–1.030)
Urobilinogen, UA: 0.2 mg/dL (ref 0.0–1.0)
pH: 5.5 (ref 5.0–8.0)

## 2013-10-23 LAB — MAGNESIUM: Magnesium: 2.1 mg/dL (ref 1.5–2.5)

## 2013-10-23 LAB — TROPONIN I

## 2013-10-23 LAB — GLUCOSE, CAPILLARY
GLUCOSE-CAPILLARY: 71 mg/dL (ref 70–99)
GLUCOSE-CAPILLARY: 162 mg/dL — AB (ref 70–99)
Glucose-Capillary: 129 mg/dL — ABNORMAL HIGH (ref 70–99)

## 2013-10-23 LAB — PRO B NATRIURETIC PEPTIDE: Pro B Natriuretic peptide (BNP): 1661 pg/mL — ABNORMAL HIGH (ref 0–125)

## 2013-10-23 LAB — D-DIMER, QUANTITATIVE (NOT AT ARMC): D-Dimer, Quant: 0.81 ug/mL-FEU — ABNORMAL HIGH (ref 0.00–0.48)

## 2013-10-23 LAB — PHOSPHORUS: PHOSPHORUS: 4.7 mg/dL — AB (ref 2.3–4.6)

## 2013-10-23 MED ORDER — HEPARIN BOLUS VIA INFUSION
5000.0000 [IU] | Freq: Once | INTRAVENOUS | Status: AC
  Administered 2013-10-23: 5000 [IU] via INTRAVENOUS
  Filled 2013-10-23: qty 5000

## 2013-10-23 MED ORDER — HEPARIN (PORCINE) IN NACL 100-0.45 UNIT/ML-% IJ SOLN
1400.0000 [IU]/h | INTRAMUSCULAR | Status: DC
  Administered 2013-10-23: 1500 [IU]/h via INTRAVENOUS
  Administered 2013-10-24 (×2): 1400 [IU]/h via INTRAVENOUS
  Filled 2013-10-23 (×3): qty 250

## 2013-10-23 MED ORDER — INSULIN ASPART 100 UNIT/ML ~~LOC~~ SOLN
0.0000 [IU] | Freq: Three times a day (TID) | SUBCUTANEOUS | Status: DC
  Administered 2013-10-24: 1 [IU] via SUBCUTANEOUS
  Administered 2013-10-24: 3 [IU] via SUBCUTANEOUS
  Administered 2013-10-25 – 2013-10-27 (×4): 1 [IU] via SUBCUTANEOUS
  Administered 2013-10-28 (×2): 2 [IU] via SUBCUTANEOUS
  Administered 2013-10-28: 1 [IU] via SUBCUTANEOUS
  Administered 2013-10-29 – 2013-10-30 (×4): 2 [IU] via SUBCUTANEOUS
  Administered 2013-10-30: 3 [IU] via SUBCUTANEOUS
  Administered 2013-10-31: 2 [IU] via SUBCUTANEOUS
  Administered 2013-10-31: 5 [IU] via SUBCUTANEOUS
  Administered 2013-11-02: 3 [IU] via SUBCUTANEOUS
  Administered 2013-11-02: 1 [IU] via SUBCUTANEOUS

## 2013-10-23 MED ORDER — DIPHENHYDRAMINE HCL 25 MG PO CAPS
25.0000 mg | ORAL_CAPSULE | Freq: Every evening | ORAL | Status: DC | PRN
  Administered 2013-10-24 – 2013-10-25 (×3): 25 mg via ORAL
  Filled 2013-10-23 (×3): qty 1

## 2013-10-23 MED ORDER — SODIUM CHLORIDE 0.9 % IJ SOLN
3.0000 mL | Freq: Two times a day (BID) | INTRAMUSCULAR | Status: DC
  Administered 2013-10-23 – 2013-11-01 (×15): 3 mL via INTRAVENOUS

## 2013-10-23 MED ORDER — BRINZOLAMIDE 1 % OP SUSP
1.0000 [drp] | Freq: Two times a day (BID) | OPHTHALMIC | Status: DC
  Administered 2013-10-24 – 2013-11-02 (×20): 1 [drp] via OPHTHALMIC
  Filled 2013-10-23: qty 10

## 2013-10-23 MED ORDER — FLUTICASONE PROPIONATE 50 MCG/ACT NA SUSP
2.0000 | Freq: Every day | NASAL | Status: DC
  Administered 2013-10-28 – 2013-11-01 (×5): 2 via NASAL
  Filled 2013-10-23: qty 16

## 2013-10-23 MED ORDER — OXYBUTYNIN CHLORIDE 5 MG PO TABS
5.0000 mg | ORAL_TABLET | Freq: Three times a day (TID) | ORAL | Status: DC | PRN
  Filled 2013-10-23: qty 1

## 2013-10-23 MED ORDER — ATORVASTATIN CALCIUM 40 MG PO TABS
40.0000 mg | ORAL_TABLET | Freq: Every day | ORAL | Status: DC
  Administered 2013-10-24 – 2013-11-02 (×10): 40 mg via ORAL
  Filled 2013-10-23 (×11): qty 1

## 2013-10-23 MED ORDER — ENOXAPARIN SODIUM 30 MG/0.3ML ~~LOC~~ SOLN
30.0000 mg | SUBCUTANEOUS | Status: DC
  Filled 2013-10-23: qty 0.3

## 2013-10-23 MED ORDER — ASPIRIN EC 81 MG PO TBEC
81.0000 mg | DELAYED_RELEASE_TABLET | Freq: Every day | ORAL | Status: DC
  Administered 2013-10-24 – 2013-10-27 (×4): 81 mg via ORAL
  Filled 2013-10-23 (×6): qty 1

## 2013-10-23 MED ORDER — AMLODIPINE BESYLATE 10 MG PO TABS
10.0000 mg | ORAL_TABLET | Freq: Every day | ORAL | Status: DC
  Administered 2013-10-24 – 2013-11-02 (×10): 10 mg via ORAL
  Filled 2013-10-23 (×11): qty 1

## 2013-10-23 MED ORDER — PNEUMOCOCCAL VAC POLYVALENT 25 MCG/0.5ML IJ INJ
0.5000 mL | INJECTION | INTRAMUSCULAR | Status: AC
  Administered 2013-10-25: 0.5 mL via INTRAMUSCULAR
  Filled 2013-10-23: qty 0.5

## 2013-10-23 MED ORDER — METOPROLOL TARTRATE 25 MG PO TABS
25.0000 mg | ORAL_TABLET | Freq: Two times a day (BID) | ORAL | Status: DC
  Administered 2013-10-23 – 2013-11-02 (×20): 25 mg via ORAL
  Filled 2013-10-23 (×22): qty 1

## 2013-10-23 MED ORDER — ALBUTEROL SULFATE (2.5 MG/3ML) 0.083% IN NEBU
2.5000 mg | INHALATION_SOLUTION | Freq: Once | RESPIRATORY_TRACT | Status: AC
  Administered 2013-10-23: 2.5 mg via RESPIRATORY_TRACT
  Filled 2013-10-23: qty 3

## 2013-10-23 MED ORDER — INSULIN GLARGINE 100 UNIT/ML ~~LOC~~ SOLN
15.0000 [IU] | Freq: Every day | SUBCUTANEOUS | Status: DC
  Administered 2013-10-23 – 2013-10-29 (×7): 15 [IU] via SUBCUTANEOUS
  Filled 2013-10-23 (×7): qty 0.15

## 2013-10-23 MED ORDER — INSULIN GLARGINE 100 UNIT/ML ~~LOC~~ SOLN
20.0000 [IU] | Freq: Every day | SUBCUTANEOUS | Status: DC
  Filled 2013-10-23: qty 0.2

## 2013-10-23 MED ORDER — PANTOPRAZOLE SODIUM 40 MG PO TBEC
80.0000 mg | DELAYED_RELEASE_TABLET | Freq: Every day | ORAL | Status: DC
  Administered 2013-10-24 – 2013-11-02 (×10): 80 mg via ORAL
  Filled 2013-10-23 (×9): qty 2

## 2013-10-23 MED ORDER — BRIMONIDINE TARTRATE 0.2 % OP SOLN
1.0000 [drp] | Freq: Two times a day (BID) | OPHTHALMIC | Status: DC
  Administered 2013-10-24 – 2013-11-02 (×20): 1 [drp] via OPHTHALMIC
  Filled 2013-10-23: qty 5

## 2013-10-23 NOTE — Progress Notes (Signed)
ANTICOAGULATION CONSULT NOTE - Initial Consult  Pharmacy Consult for Heparin Indication: pulmonary embolus  - empiric coverage pending further work-up for possible PE   No Known Allergies  Patient Measurements: Height:  (167.6 cm) Weight: 223 lb (101.152 kg) IBW/kg (Calculated) : 63.8 Heparin Dosing Weight: 86.17 kg Vital Signs: Temp: 98.3 F (36.8 C) (09/15 2028) Temp src: Oral (09/15 2028) BP: 166/73 mmHg (09/15 2028) Pulse Rate: 81 (09/15 2028)  Labs:  Recent Labs  10/23/13 1149 10/23/13 1230 10/23/13 1811  HGB  --  8.5*  --   HCT  --  25.4*  --   PLT  --  156  --   CREATININE  --  2.67*  --   TROPONINI <0.30  --  <0.30    Estimated Creatinine Clearance: 36.1 ml/min (by C-G formula based on Cr of 2.67).   Medical History: Past Medical History  Diagnosis Date  . Hypertension   . Chronic kidney disease   . Hypercholesteremia   . PONV (postoperative nausea and vomiting)   . Peripheral vascular disease   . H/O hiatal hernia     hx of years ago   . Pneumonia 10/23/2013  . IDDM (insulin dependent diabetes mellitus)   . 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 amLODipine (NORVASC) 10 MG tablet Take 1 tablet (10 mg total) by mouth daily.  90 tablet  3  . aspirin 81 MG tablet Take 81 mg by mouth at bedtime.       Marland Kitchen atorvastatin (LIPITOR) 40 MG tablet Take 1 tablet (40 mg total) by mouth daily.  30 tablet  6  . Brinzolamide-Brimonidine (SIMBRINZA) 1-0.2 % SUSP Place 1 drop into the left eye 2 (two) times daily.      Marland Kitchen doxycycline (VIBRA-TABS) 100 MG tablet Take 1 tablet (100 mg total) by mouth 2 (two) times daily.  20 tablet  0  . fluticasone (FLONASE) 50 MCG/ACT nasal spray Place 2 sprays into both nostrils daily.  16 g  1  . insulin glargine (LANTUS) 100 UNIT/ML injection Inject 20 Units into the skin at bedtime.      Marland Kitchen lisinopril (PRINIVIL,ZESTRIL) 20 MG  tablet Take 1 tablet (20 mg total) by mouth daily.  90 tablet  4  . metoprolol tartrate (LOPRESSOR) 25 MG tablet Take 1 tablet (25 mg total) by mouth 2 (two) times daily.  180 tablet  4  . montelukast (SINGULAIR) 10 MG tablet Take 1 tablet (10 mg total) by mouth at bedtime.  30 tablet  3  . mupirocin ointment (BACTROBAN) 2 % Apply 1 application topically 2 (two) times daily. Apply to affecte  15 g  0  . omeprazole (PRILOSEC) 40 MG capsule Take 1 capsule (40 mg total) by mouth daily.  30 capsule  3  . oxybutynin (DITROPAN) 5 MG tablet Take 1 tablet (5 mg total) by mouth every 8 (eight) hours as needed for bladder spasms.  30 tablet  0   Scheduled:  . amLODipine  10 mg Oral Daily  . aspirin EC  81 mg Oral QHS  . atorvastatin  40 mg Oral Daily  . fluticasone  2 spray Each Nare Daily  . insulin aspart  0-9 Units Subcutaneous TID WC  . insulin glargine  15 Units Subcutaneous QHS  . metoprolol tartrate  25 mg Oral BID  . pantoprazole  80 mg Oral Daily  . [  START ON 10/24/2013] pneumococcal 23 valent vaccine  0.5 mL Intramuscular Tomorrow-1000  . sodium chloride  3 mL Intravenous Q12H    Assessment: 53 y.o male presented with worsening dyspnea and ARF.  Not on any anticoagulant prior to admission.  H/H 8.6/25.4 and PLTC = 156K.  D-Dimer elevated = 0.81.  PMH is significant for chronic cough, CKD, poorly controlled HTN and DM, HLD, neuropathy, acute frontal sinusitis in August, urinary retention and as noted above (medical history).  No bleeding noted.  Goal of Therapy:  Heparin level 0.3-0.7 units/ml Monitor platelets by anticoagulation protocol: Yes   Plan:  Heparin bolus 5000 units IV x1 Heparin infusion 1500 units/ hr Initial 6 hour heparin level and CBC Daily heparin level and CBC.  Lawrence [name], RPh Clinical Pharmacist Pager: [phone] 10/23/2013,9:22 PM

## 2013-10-23 NOTE — Progress Notes (Signed)
Family Practice Teaching Service Interval Progress Note  S: Paged by nurse, patient still having shortness of breath and requested something to help him sleep. Evaluated patient, sitting up in chair at bedside. He reports having persistent shortness of breath described as "wheezing", worse when he is lying flat in bed, endorses chronic cough and chronic worsening lower ext edema in Left leg. Denies any chest pain or tightness, significant worsening SOB since admission, nausea, diaphoresis, calf pain. Additionally, requested something to help him sleep, previously not on any sleep-aids.  Discussed current treatment plan with empiric Heparin until further testing to rule out PE. He denied any prior hx of PUD or prior GI bleed. No prior DVT / PE. Expressed concern for likely CHF, pending further testing.  O: BP 166/73  Pulse 81  Temp(Src) 98.3 F (36.8 C) (Oral)  Ht  (1.676 m)  Wt 223 lb (101.152 kg)  BMI 36.01 kg/m2  SpO2 96%  Gen - sitting up in bedside chair, cooperative, NAD HEENT - MMM, no supplemental O2 Neck - supple, stable mild JVD (difficult to discern with excess neck tissue) Heart - RRR, no murmurs heard Lungs - Persistent bibasilar crackles heard, no wheezing or focal crackles. Mild inc work of breathing, without accessory muscle use or abdominal breathing. Ext - LLE: stable +4 pitting edema to knee, non-tender and no erythema. Right BKA Skin - warm, dry Neuro - awake, alert  A&P: Briefly, Rock Sobol is a 6 yr M admitted for acute renal failure and worsening dyspnea with cough, PMH poorly controlled DM, HTN, CKD. No prior diagnosis of CHF or significant cardiac history, no prior DVT / PE.  # Dyspnea - Presumed likely new dx CHF (elevated ProBNP 1661, concern for effusion on CXR) vs fluid overload from ARF. Additionally, concern for PE now with elevated D-dimer 0.81, unable to obtain CTA Chest due to AKI. Less likely acute MI with chronic symptoms and troponin-I  negative x 2. - Requested EKG per RN - Troponin-I x1 pending - Heparin gtt per pharm, empiric coverage for PE (no hx GIB, stable Hgb) - Ordered VQ scan to rule out PE - Pending Lower Ext Duplex for eval DVT - Ordered Albuterol neb x 1 dose as trial only, unlikely to provide significant relief, no wheezing on exam, reported strong second hand smoke history  # Insomnia - Ordered Benadryl  PO QHS PRN  Saralyn Pilar, DO Family Medicine PGY-2 Service Pager [phone]

## 2013-10-23 NOTE — Assessment & Plan Note (Signed)
Patient with worsening dyspnea and cough, now with PND (cannot lay supine to sleep).  Suspect pulmonary edema secondary to worsening renal function; also to consider failure of treatment for CAP, or possibly PE (much less likely).  To get CXR 2-view when arrives to floor; also D-dimer.  If D-dimer is positive, to proceed to doppler US of lower extremity and consideration VQ scan.  ECG, ECHO, pro-BNP and serial Troponin (x2) given risk factors for MI. The tightness and dyspnea he describes does not have anginal quality.

## 2013-10-23 NOTE — Assessment & Plan Note (Addendum)
Worsening renal function, has been holding ACEI and still with rising Cr.  Plan to admit the patient given rapid acceleration, as well as associated worsening dyspnea and cough. Renal US and UA upon admission, will need renal consult during this hospitalization.

## 2013-10-23 NOTE — Progress Notes (Signed)
   Subjective:    Patient ID: Lawrence [name], male    DOB: 1961-02-02, 53 y.o.   MRN: [id number]  HPI Lawrence [name] comes in today for followup of several issues.  He continues with the cough, which has returned to being a dry cough since last visit.  Now gets sweats with the cough.  New PND, cannot lay supine to sleep and has had several sleepless nights.  Denies fevers or chills.  Denies chest pain but has had some tightness when he coughs.   ROS: No measured fevers; no decreased appetite. New PND since past visit on Sept 11; endorses chest tightness and R lower flank pain with coughing only.  No diarrhea or nausea/vomiting. He continues to have indwelling urinary catheter and has not had bladder spasms in recent times.   Social Hx: Never-smoker, does not drink. Review of Systems     Objective:   Physical Exam Alert, oriented, generally well-appearing. Sallow appearance.  HEENT Neck supple, no cervical adenopathy. No observable JVD on exam. Moist mucus membranes.  COR regular S1S2, no extra sounds PULM Bibasilar crackles heard on lung exam; no wheezing.  ABD Obese, soft, nontender. No CVA tenderness.  MSK: S/p R BKA, has prosthesis. Left LE with non-pitting edema. No cords, no popliteal tenderness. No erythema.        Assessment & Plan:

## 2013-10-23 NOTE — H&P (Signed)
FMTS Attending ADmit Note Pt seen and examined by me, discussed with admitting resident and I agree with Dr Lucienne Minks note.  Please see my clinic note from today with further details.   [name], MD

## 2013-10-23 NOTE — Assessment & Plan Note (Signed)
Patient with rapidly worsening ARF; we have been holding his ACEI with continued progression.  He does not appear pre-renal on clinical exam. No other nephrotoxic agents on history. Suspect related to poorly controlled HTN and DM.  To order renal US and UA; consult nephrology for evaluation and recommendations.

## 2013-10-23 NOTE — H&P (Signed)
Family Medicine Teaching [facility] Admission History and Physical Service Pager: [phone]  Patient name: Lawrence [name] Medical record number: [id number] Date of birth: [birth year] Age: 53 y.o. Gender: male  Primary Care Provider: [name], MD Consultants: Renal Code Status: FULL per discussion with patient  Chief Complaint: dyspnea, ARF  Assessment and Plan: Lawrence [name] is a 53 y.o. male presenting with worsened dyspnea and ARF. PMH is significant for chronic cough, CKD, poorly controlled HTN and DM, HLD, neuropathy, acute frontal sinusitis in August, and urinary retention.  AoCKD Creatinine 2.78 today from baseline over past 6 months of 1.6-1.7, prior to that 1.2 (11/2012)., BUN:Cr ratio 18.7. In setting of PCP holding ACE-I. Recent UA with hyaline casts. However, does not appear prerenal on exam. Suspecting renal failure is sequelae from his poorly controlled HTN and DM. Fluid overload currently is likely resultant. - Admit to FPTS to telemetry, attending Dr Lum Babe. - CMET, Mg, Phos; AM BMET. - UA - Renal US - Nephrology consulted and spoke with [name]. Their service will eval once studies begin to come back. Ordered urine protein:creatinine ration on her suggestion. Greatly appreciate recs.  Cough DDx includes new CHF in pt at risk for silent MI, renal failure causing fluid overload, untreated pneumonia. No h/o CHF or other cardiac issue per pt. Low likelihood of PE given other cause of cough is more likely; WELLS score for PE is 0. Also consider contribution of anemia. Afebrile with stable vitals on room air currently. - 2D Echocardiogram, BNP - 12-lead EKG and Troponin x 2 with chest tightness and h/o poorly controlled DM and HTN - 2 view CXR - d-dimer; if positive, will proceed with LE duplex and possible VQ scan. - CBC with diff to eval WBC in setting of prior concern for pna. AM CBC. - d/c doxycycline - Continue flonase, PPI, and aspirin  daily. 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 - CBC today. - Consider reticulocyte count. - Colonoscopy recommendation if not already done at 50.  Poorly controlled HTN - Continue norvasc, metoprolol   BID - Continue to hold lisinopril in setting of AoCKD.  DM Per PCP, historically he has not been very compliant with lantus. - Continue lantus but decrease to 15 units daily (from 20) in setting of worsened renal function, and added sensitive SSI. - DM educator consulted.  HLD - Continue lipitor  daily.  FEN/GI: SLIV, Heart Healthy/Carb Modified diet Prophylaxis: SQ Lovenox with renal correction  Disposition: Pending workup for ARF and dyspnea.  History of Present Illness: Lawrence [name] is a 53 y.o. male presenting with worsening dyspnea and cough, now with 1 week of orthopnea and wet cough. He reports cough had been present a few months and PCP was attempting treatment for allergic rhinitis related cough and also for CAP with no improvement with macrolide or doxycycline. On visit just prior to today (9/11), he had new right flank pain and bibasilar crackles and xray showed new finding of bibasilar opacities with probable minimal effusions. Also endorsed wheezing, chest tightness and dyspnea with no chest pain. He thinks his legs have been swelling more lately as well, though clothes fit the same. He denies h/o heart failure or other cardiac illness. He denies fevers or chills but has had sweats. He also denies h/o asthma or COPD. He denies dizziness or syncope. He  denies other symptoms.  Also noted to have worsening renal function despite holding ACE-I. He has been eating and drinking normally and reports that urine appears normal. 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 He denies any malfunction of his catheter. He denies new medications other than the recent doxycycline and medications for allergy. He stopped lisinopril last week. Has started also passing urine from his penis and denies dysuria or flank pain.   Review Of Systems: Per HPI with the following additions: None Otherwise 12 point review of systems was performed and was unremarkable.  Patient Active Problem List   Diagnosis Date Noted  . ARF (acute renal failure) 10/23/2013  . Normocytic anemia 10/10/2013  . Tinnitus of both ears 10/09/2013  . Sinusitis, acute frontal 09/25/2013  . Suspected soft tissue infection 08/07/2013  . Surgical wound dehiscence 10/04/2012  . Dehiscence of amputation stump 10/04/2012  . Unilateral complete BKA--right 08/22/2012  . Urinary retention 08/19/2012  . Osteomyelitis of right foot 08/17/2012    Class: Diagnosis of  . Cellulitis 06/03/2012  . Post-operative wound abscess 05/29/2012  . Gangrene of toe 05/02/2012  . Chronic cough 02/22/2012  . Hematuria 03/23/2011  . Prostate asymmetry 03/23/2011  . Colon cancer screening 02/19/2011  . Neuropathy 08/25/2010  . Chronic kidney disease (CKD), stage 4 03/14/2009  . Accelerated hypertension 06/18/2008  . DM (diabetes mellitus), type 2, uncontrolled 12/26/2007  . HYPERCHOLESTEROLEMIA 12/26/2007   Past Medical History: Past Medical History  Diagnosis Date  . Hypertension   . Chronic kidney disease   . Hypercholesteremia   . Diabetes mellitus     insulin dependent  . Depression   . PONV (postoperative nausea and vomiting)   . Peripheral vascular disease   . H/O hiatal hernia     hx of years ago    Past Surgical History: Past Surgical History  Procedure Laterality Date  . Amputation Right 05/03/2012    Procedure: Right First Ray AMPUTATION;  Surgeon: Eldred Manges, MD;  Location: [facility] OR;  Service: Orthopedics;  Laterality: Right;  . I&d extremity Right 05/29/2012     Procedure: IRRIGATION AND DEBRIDEMENT EXTREMITY- right foot;  Surgeon: Eldred Manges, MD;  Location: [facility] OR;  Service: Orthopedics;  Laterality: Right;  Right Foot Debridement, VAC Application  . Toe amputation Right 05/29/2012    1ST  ray      Dr Ophelia Charter  . Amputation Right 08/16/2012    Procedure: AMPUTATION BELOW KNEE;  Surgeon: Eldred Manges, MD;  Location: [facility] OR;  Service: Orthopedics;  Laterality: Right;  Right Below Knee Amputation  . Amputation Right 10/04/2012    Procedure: AMPUTATION BELOW KNEE REVISION;  Surgeon: Eldred Manges, MD;  Location: [facility] OR;  Service: Orthopedics;  Laterality: Right;  . Insertion of suprapubic catheter N/A 03/02/2013    Procedure: INSERTION OF SUPRAPUBIC CATHETER;  Surgeon: Su Grand, MD;  Location: [facility] ORS;  Service: Urology;  Laterality: N/A;   Social History: History  Substance Use Topics  . Smoking status: Never Smoker   . Smokeless tobacco: Never Used  . Alcohol Use: Yes     Comment: rarely  Denies tobacco, drugs, or alcohol use  Additional social history: None Please also refer to relevant sections of EMR.  Family History: No family history on file. Allergies and Medications: No Known Allergies No current facility-administered medications on file prior to encounter.   Current Outpatient Prescriptions on  File Prior to Encounter  Medication Sig Dispense Refill  . amLODipine (NORVASC) 10 MG tablet Take 1 tablet (10 mg total) by mouth daily.  90 tablet  3  . aspirin 81 MG tablet Take 81 mg by mouth at bedtime.       Marland Kitchen atorvastatin (LIPITOR) 40 MG tablet Take 1 tablet (40 mg total) by mouth daily.  30 tablet  6  . doxycycline (VIBRA-TABS) 100 MG tablet Take 1 tablet (100 mg total) by mouth 2 (two) times daily.  20 tablet  0  . fluticasone (FLONASE) 50 MCG/ACT nasal spray Place 2 sprays into both nostrils daily.  16 g  1  . insulin glargine (LANTUS) 100 UNIT/ML injection Inject 20 Units into the skin at bedtime.      Marland Kitchen lisinopril (PRINIVIL,ZESTRIL) 20 MG  tablet Take 1 tablet (20 mg total) by mouth daily.  90 tablet  4  . metoprolol tartrate (LOPRESSOR) 25 MG tablet Take 1 tablet (25 mg total) by mouth 2 (two) times daily.  180 tablet  4  . montelukast (SINGULAIR) 10 MG tablet Take 1 tablet (10 mg total) by mouth at bedtime.  30 tablet  3  . mupirocin ointment (BACTROBAN) 2 % Apply 1 application topically 2 (two) times daily. Apply to affecte  15 g  0  . omeprazole (PRILOSEC) 40 MG capsule Take 1 capsule (40 mg total) by mouth daily.  30 capsule  3  . oxybutynin (DITROPAN) 5 MG tablet Take 1 tablet (5 mg total) by mouth every 8 (eight) hours as needed for bladder spasms.  30 tablet  0  . oxyCODONE-acetaminophen (PERCOCET/ROXICET) 5-325 MG per tablet Take 2 tablets by mouth every 4 (four) hours as needed for severe pain.  6 tablet  0  . prednisoLONE acetate (PRED FORTE) 1 % ophthalmic suspension Place 1 drop into the left eye 2 (two) times daily.         Objective: There were no vitals taken for this visit. Exam: General: NAD, laying in hospital bed HEENT: AT/[place], sclera mildly icteric vs baseline for pt, EOMI, o/p clear, neck with much redundant soft tissue making palpation of LN difficult but no obvious LAD, tender left submandibular area, neck supple, MMM Cardiovascular: RRR, no m/r/g, distant due to habitus, 1+ bilateral radial and left DP pulse, mild JVD to 2cm, limited by neck redundant tissue Respiratory: Bibasilar crackles, normal effort, no nasal flaring or abdominal breathing Abdomen: Obese, s/nt/nd, no obvious organomegaly Extremities: Right BKA, left 4+ pitting edema to knee, no tenderness or erythema Skin: No rash or cyanosis Neuro: Awake, alert, normal speech, grossly intact  Labs and Imaging: CBC BMET   Recent Labs Lab 10/19/13 0916  WBC 6.2  HGB 8.1*  HCT 24.1*  PLT 127*    Recent Labs Lab 10/19/13 0916  NA 142  K 4.9  CL 111  CO2 25  BUN 52*  CREATININE 2.78*  GLUCOSE 133*  CALCIUM 8.4     CXR 10/19/13:  IMPRESSION:  New bibasilar lung opacity which may be due to atelectasis,  pneumonia or other infiltrate, or a combination. Probable minimal  effusions. No overt pulmonary edema.   Leona Singleton, MD 10/23/2013, 11:47 AM PGY-3, [facility] FPTS Intern pager: [phone], text pages welcome

## 2013-10-24 DIAGNOSIS — N181 Chronic kidney disease, stage 1: Secondary | ICD-10-CM

## 2013-10-24 DIAGNOSIS — R0989 Other specified symptoms and signs involving the circulatory and respiratory systems: Secondary | ICD-10-CM

## 2013-10-24 DIAGNOSIS — I10 Essential (primary) hypertension: Secondary | ICD-10-CM

## 2013-10-24 DIAGNOSIS — R791 Abnormal coagulation profile: Secondary | ICD-10-CM

## 2013-10-24 DIAGNOSIS — R0609 Other forms of dyspnea: Secondary | ICD-10-CM

## 2013-10-24 LAB — HEPARIN LEVEL (UNFRACTIONATED)
HEPARIN UNFRACTIONATED: 0.72 [IU]/mL — AB (ref 0.30–0.70)
Heparin Unfractionated: 0.82 IU/mL — ABNORMAL HIGH (ref 0.30–0.70)

## 2013-10-24 LAB — BASIC METABOLIC PANEL
Anion gap: 16 — ABNORMAL HIGH (ref 5–15)
BUN: 46 mg/dL — ABNORMAL HIGH (ref 6–23)
CALCIUM: 8.9 mg/dL (ref 8.4–10.5)
CO2: 17 meq/L — AB (ref 19–32)
CREATININE: 3 mg/dL — AB (ref 0.50–1.35)
Chloride: 110 mEq/L (ref 96–112)
GFR calc Af Amer: 26 mL/min — ABNORMAL LOW (ref >90)
GFR calc non Af Amer: 22 mL/min — ABNORMAL LOW (ref >90)
GLUCOSE: 186 mg/dL — AB (ref 70–99)
Potassium: 4.7 mEq/L (ref 3.7–5.3)
Sodium: 143 mEq/L (ref 137–147)

## 2013-10-24 LAB — CBC
HEMATOCRIT: 24.7 % — AB (ref 39.0–52.0)
Hemoglobin: 8.3 g/dL — ABNORMAL LOW (ref 13.0–17.0)
MCH: 27 pg (ref 26.0–34.0)
MCHC: 33.6 g/dL (ref 30.0–36.0)
MCV: 80.5 fL (ref 78.0–100.0)
Platelets: 158 10*3/uL (ref 150–400)
RBC: 3.07 MIL/uL — ABNORMAL LOW (ref 4.22–5.81)
RDW: 14.6 % (ref 11.5–15.5)
WBC: 4.3 10*3/uL (ref 4.0–10.5)

## 2013-10-24 LAB — GLUCOSE, CAPILLARY
GLUCOSE-CAPILLARY: 164 mg/dL — AB (ref 70–99)
GLUCOSE-CAPILLARY: 87 mg/dL (ref 70–99)
Glucose-Capillary: 121 mg/dL — ABNORMAL HIGH (ref 70–99)
Glucose-Capillary: 114 mg/dL — ABNORMAL HIGH (ref 70–99)

## 2013-10-24 MED ORDER — SENNA 8.6 MG PO TABS
1.0000 | ORAL_TABLET | Freq: Every day | ORAL | Status: DC | PRN
  Filled 2013-10-24: qty 1

## 2013-10-24 MED ORDER — HEPARIN (PORCINE) IN NACL 100-0.45 UNIT/ML-% IJ SOLN
1250.0000 [IU]/h | INTRAMUSCULAR | Status: DC
  Administered 2013-10-25: 1250 [IU]/h via INTRAVENOUS
  Filled 2013-10-24: qty 250

## 2013-10-24 MED ORDER — FERROUS SULFATE 325 (65 FE) MG PO TABS
325.0000 mg | ORAL_TABLET | Freq: Two times a day (BID) | ORAL | Status: DC
  Administered 2013-10-24 – 2013-10-31 (×14): 325 mg via ORAL
  Filled 2013-10-24 (×16): qty 1

## 2013-10-24 MED ORDER — FUROSEMIDE 10 MG/ML IJ SOLN
80.0000 mg | Freq: Once | INTRAMUSCULAR | Status: AC
  Administered 2013-10-24: 80 mg via INTRAVENOUS
  Filled 2013-10-24: qty 8

## 2013-10-24 MED ORDER — LEVOFLOXACIN 750 MG PO TABS
750.0000 mg | ORAL_TABLET | ORAL | Status: DC
  Administered 2013-10-24: 750 mg via ORAL
  Filled 2013-10-24 (×2): qty 1

## 2013-10-24 NOTE — Progress Notes (Signed)
*  PRELIMINARY RESULTS* Vascular Ultrasound Lower extremity venous duplex has been completed.  Preliminary findings: no evidence of DVT  Farrel Demark, RDMS, RVT  10/24/2013, 9:50 AM

## 2013-10-24 NOTE — Progress Notes (Signed)
FMTS ATTENDING  NOTE Juliahna Wiswell,MD I  have seen and examined this patient, reviewed their chart. I have discussed this patient with the resident. I agree with the resident's findings, assessment and care plan.   53 y.o. male with PMX of HTN, HLD, DM type II , s/p right BKA (2014), chronic suprapubic catheter (placed in 02/2013 by Dr. Brunilda Payor), and CKD presented with hx of worsening SOB and left lower extremity swelling. He endorsed associated cough for about 1 wk for which his PCP treated him for few days on antibiotic with no improvement of his symptoms,he denies having fever at home. No other concern, feels better today except that his bed is not comfortable for him hence will like a change of bed.  No current facility-administered medications on file prior to encounter.   Current Outpatient Prescriptions on File Prior to Encounter  Medication Sig Dispense Refill  . amLODipine (NORVASC) 10 MG tablet Take 1 tablet (10 mg total) by mouth daily.  90 tablet  3  . aspirin 81 MG tablet Take 81 mg by mouth at bedtime.       Marland Kitchen atorvastatin (LIPITOR) 40 MG tablet Take 1 tablet (40 mg total) by mouth daily.  30 tablet  6  . doxycycline (VIBRA-TABS) 100 MG tablet Take 1 tablet (100 mg total) by mouth 2 (two) times daily.  20 tablet  0  . fluticasone (FLONASE) 50 MCG/ACT nasal spray Place 2 sprays into both nostrils daily.  16 g  1  . insulin glargine (LANTUS) 100 UNIT/ML injection Inject 20 Units into the skin at bedtime.      Marland Kitchen lisinopril (PRINIVIL,ZESTRIL) 20 MG tablet Take 1 tablet (20 mg total) by mouth daily.  90 tablet  4  . metoprolol tartrate (LOPRESSOR) 25 MG tablet Take 1 tablet (25 mg total) by mouth 2 (two) times daily.  180 tablet  4  . montelukast (SINGULAIR) 10 MG tablet Take 1 tablet (10 mg total) by mouth at bedtime.  30 tablet  3  . mupirocin ointment (BACTROBAN) 2 % Apply 1 application topically 2 (two) times daily. Apply to affecte  15 g  0  . omeprazole (PRILOSEC) 40 MG capsule Take 1  capsule (40 mg total) by mouth daily.  30 capsule  3  . oxybutynin (DITROPAN) 5 MG tablet Take 1 tablet (5 mg total) by mouth every 8 (eight) hours as needed for bladder spasms.  30 tablet  0   Past Medical History  Diagnosis Date  . Hypertension   . Chronic kidney disease   . Hypercholesteremia   . PONV (postoperative nausea and vomiting)   . Peripheral vascular disease   . H/O hiatal hernia     hx of years ago   . Pneumonia 10/23/2013  . IDDM (insulin dependent diabetes mellitus)   . Depression 08/2012    "just when foot first got cut off"   Filed Vitals:   10/23/13 2028 10/23/13 2336 10/24/13 0419 10/24/13 1432  BP: 166/73  153/65 145/65  Pulse: 81  99 76  Temp: 98.3 F (36.8 C)  99.1 F (37.3 C) 98.4 F (36.9 C)  TempSrc: Oral  Oral Oral  Resp:   20   Height:      Weight:   214 lb 15.2 oz (97.5 kg)   SpO2: 96% 96% 96% 97%   Exam: Gen: Not in distress seated on his wheel chair. HEENT; [place]/AT, EOMI, PERRLA. Resp: Air entry equal and clear b/L, no wheezing. Heart: S1 S2 normal, no  murmurs. Abd: Soft, NT,BS+. Ext: RLL BKA with ++ of left LL.  A/P: 53 Y/O M with  1. Dyspnea: New onset CHF vs Pulmonary embolism vs CAP.     S/P doxycycline treatment as outpatient for PNA.     Chest xray showed increased bibasal infiltrate, patient however afebrile with stable white count.     Start Levaquin for PNA coverage.     V/Q scan to r/o PE.     ECHO for LVEF R/O CHF.     Monitor O2 requirement.  2. Acute on chronic kidney disease and abnormal UA     ?? Medication induced ( ACEi) vs CHF/cardiorenal syndrome considering pedal swelling.     Renal U/S reviewed with no hydronephrosis.     Recommend renal consult.     Monitor urine output.     UA reviewed with + nitrite and leukocyte, hx of multiple episodes of UTI ( Klebsiella)     Likely related to chronic suprapubic catheter.     F/U urine culture. Now on Levaquin.  3. HTN/DM/HLD: I agree with current treatment regimen.

## 2013-10-24 NOTE — Progress Notes (Signed)
Inpatient Diabetes Program Recommendations  AACE/ADA: New Consensus Statement on Inpatient Glycemic Control  Target Ranges:  Prepandial:   less than 140 mg/dL      Peak postprandial:   less than 180 mg/dL (1-2 hours)      Critically ill patients:  140 - 180 mg/dL  Pager:  [phone] Hours:  8 am-10pm   Reason for Visit: Consult    Patient has worsening renal function.  Current HgbA1C 7.5.  Patient has not been taking long acting insulin as prescribed.  Most likely due to low glucose.  I agree with decreasing home dose.  Current glucose this am was 186 mg/dL.  Will continue to follow as needed.  Alfredia Client PhD, RN, BC-ADM Diabetes Coordinator  Office:  [phone] Team Pager:  [phone]

## 2013-10-24 NOTE — Progress Notes (Signed)
ANTICOAGULATION CONSULT NOTE - Follow Up Consult  Pharmacy Consult for heparin Indication: r/o PE  Labs:  Recent Labs  10/23/13 1149 10/23/13 1230 10/23/13 1811 10/24/13 0535  HGB  --  8.5*  --  8.3*  HCT  --  25.4*  --  24.7*  PLT  --  156  --  158  HEPARINUNFRC  --   --   --  0.82*  CREATININE  --  2.67*  --   --   TROPONINI <0.30  --  <0.30  --     Assessment: 52yo male supratherapeutic on heparin with initial dosing for possible PE, bolus may still have a small effect.  Goal of Therapy:  Heparin level 0.3-0.7 units/ml   Plan:  Will decrease heparin gtt by 1 unit/kg/hr to 1400 units/hr and check level in 8hr.  Vernard Gambles, PharmD, BCPS  10/24/2013,6:18 AM

## 2013-10-24 NOTE — Progress Notes (Signed)
ANTICOAGULATION CONSULT NOTE - FOLLOW UP  Pharmacy Consult:  Heparin Indication:   Rule out PE  No Known Allergies  Patient Measurements: Height:  (167.6 cm) Weight: 214 lb 15.2 oz (97.5 kg) IBW/kg (Calculated) : 63.8 Heparin Dosing Weight: 86 kg  Vital Signs: Temp: 98.4 F (36.9 C) (09/16 1432) Temp src: Oral (09/16 1432) BP: 145/65 mmHg (09/16 1432) Pulse Rate: 76 (09/16 1432)  Labs:  Recent Labs  10/23/13 1149 10/23/13 1230 10/23/13 1811 10/24/13 0535 10/24/13 1410  HGB  --  8.5*  --  8.3*  --   HCT  --  25.4*  --  24.7*  --   PLT  --  156  --  158  --   HEPARINUNFRC  --   --   --  0.82* 0.72*  CREATININE  --  2.67*  --  3.00*  --   TROPONINI <0.30  --  <0.30  --   --     Estimated Creatinine Clearance: 31.5 ml/min (by C-G formula based on Cr of 3).    Assessment: Lawrence [name] continues on IV heparin for rule out PE.  Doppler negative for DVT and patient couldn't tolerate VQ scan due to dyspnea from lying flat.  Heparin level remains slightly supra-therapeutic post rate decrease.  No bleeding reported.   Goal of Therapy:  Heparin level 0.3-0.7 units/ml Monitor platelets by anticoagulation protocol: Yes    Plan:  - Decrease heparin gtt to 1250 units/hr - Daily HL / CBC    Blimy Napoleon D. Laney Potash, PharmD, BCPS Pager:  [phone] 10/24/2013, 3:12 PM

## 2013-10-24 NOTE — Consult Note (Signed)
Reason for Consult: Acute on Chronic Renal Failure Referring Physician: Dr. Dalbert Mayotte  HPI: Lawrence [name] is an 53 y.o. male w/ PPMHx of HTN, HLD, DM type II (last HbA1c 7.5 on 09/25/13), s/p right BKA (2014), chronic suprapubic catheter (placed in 02/2013 by Dr. Janice Norrie), and CKD, admitted on 10/23/13 w/ worsening shortness of breath and cough. The patient says he has been having a dry cough for a few months, but claims he started having cold-like symptoms about 4 weeks ago. At that time, he made a visit to his PCP (Dr. Lindell Noe) on 09/25/13 who treated him w/ a short course of Prednisone + azithromycin and also started him on PPI. Patient returned to the clinic on 10/19/13 and was found to have crackles in the right lung base and increased sputum production w/ his cough and started on Doxycycline. The patient also notes difficulty sleeping at night recently d/t worsening orthopnea, and PND. He also admits to dyspnea w/ exertion and recent increased left leg swelling. He denies any associated fever, chills, nausea, or vomiting.   Also of note, the patient had right BKA in 08/2012 after which he had urinary retention had an indwelling foley catheter for several months, continued to fail voiding trials, and then had a suprapubic catheter placed in 02/2013. About 1-2 months ago (?), he started being able to void normally again and claims his urine output has been very adequate, w/out dysuria, hematuria, urgency, flank pain, or significant nocturia. Mr. Wilczynski has had issues w/ his kidney function in the past in 05/2012, associated w/ Lisinopril/NSAID use and an ongoing right foot infection at that time. He was again restarted on Lisinopril in 04/2013 for optimization of his BP, but was held on 10/19/13 d/t increased Cr to 2.24.  Creatinine trending is as follows: Creatinine, Ser  Date/Time Value Ref Range Status  10/24/2013  5:35 AM 3.00* 0.50 - 1.35 mg/dL Final  10/23/2013 12:30 PM 2.67* 0.50 - 1.35 mg/dL Final  10/19/13  2.78   Lisinopril discontinued    10/09/13 2.24    06/08/13 1.62    04/27/13 1.78   Lisinopril restarted    03/02/2013  8:40 AM 1.63* 0.50 - 1.35 mg/dL Final  11/18/2012  5:00 AM 1.23  0.50 - 1.35 mg/dL Final  11/17/2012 10:14 PM 1.27  0.50 - 1.35 mg/dL Final  10/04/2012  4:29 PM 1.06  0.50 - 1.35 mg/dL Final  08/28/2012  6:35 AM 1.21  0.50 - 1.35 mg/dL Final  08/22/2012  6:40 AM 0.99  0.50 - 1.35 mg/dL Final  08/20/2012 11:24 AM 1.06  0.50 - 1.35 mg/dL Final  08/16/2012  8:01 PM 1.20  0.50 - 1.35 mg/dL Final  08/15/2012  2:00 PM 1.19  0.50 - 1.35 mg/dL Final  06/06/2012  8:07 AM 4.74* AKI during admission for osteomyelitis, ray amputation/NSAIDS/lisinopril/foot infection 0.50 - 1.35 mg/dL Final  06/05/2012  7:29 AM 4.79* 0.50 - 1.35 mg/dL Final  06/04/2012  6:40 AM 4.73* 0.50 - 1.35 mg/dL Final  06/03/2012 11:00 AM 4.70* 0.50 - 1.35 mg/dL Final  06/03/2012  7:15 AM 4.80* 0.50 - 1.35 mg/dL Final  06/02/2012  5:00 AM 4.35* 0.50 - 1.35 mg/dL Final  06/01/2012  4:44 AM 4.08* 0.50 - 1.35 mg/dL Final  05/31/2012  5:00 AM 3.33* 0.50 - 1.35 mg/dL Final  05/24/2012  2:33 PM 1.49* 0.50 - 1.35 mg/dL Final  05/07/2012  5:45 AM 2.03* 0.50 - 1.35 mg/dL Final  05/06/2012  7:23 AM 2.12* 0.50 - 1.35 mg/dL Final  05/05/2012  6:05 AM 2.24* 0.50 - 1.35 mg/dL Final  05/04/2012  4:55 AM 1.77* 0.50 - 1.35 mg/dL Final  05/03/2012  9:07 PM 1.63* 0.50 - 1.35 mg/dL Final  05/03/2012  6:10 AM 1.45* 0.50 - 1.35 mg/dL Final  05/02/2012  7:14 PM 1.23  0.50 - 1.35 mg/dL Final  01/17/2011  1:30 AM 0.97  0.50 - 1.35 mg/dL Final  03/14/2009  8:12 PM 1.06  0.40-1.50 mg/dL Final  12/17/2008  9:09 PM 1.75* 0.40-1.50 mg/dL Final  12/13/2008  8:15 PM 1.22  0.40-1.50 mg/dL Final  01/30/2008  8:53 PM 0.69  0.40-1.50 mg/dL Final  12/26/2007 10:05 PM 0.75  0.40-1.50 mg/dL Final  07/29/2007  6:55 PM 0.89   Final   Notably has had dipstick proteinuria since 2011, not quantitated until the current admission  PMH:   Past Medical History  Diagnosis Date  .  Hypertension   . Chronic kidney disease   . Hypercholesteremia   . PONV (postoperative nausea and vomiting)   . Peripheral vascular disease   . H/O hiatal hernia     hx of years ago   . Pneumonia 10/23/2013  . IDDM (insulin dependent diabetes mellitus)   . Depression 08/2012    "just when foot first got cut off"   PSH:   Past Surgical History  Procedure Laterality Date  . Amputation Right 05/03/2012    Procedure: Right First Ray AMPUTATION;  Surgeon: Marybelle Killings, MD;  Location: [facility];  Service: Orthopedics;  Laterality: Right;  . I&d extremity Right 05/29/2012    Procedure: IRRIGATION AND DEBRIDEMENT EXTREMITY- right foot;  Surgeon: Marybelle Killings, MD;  Location: [facility];  Service: Orthopedics;  Laterality: Right;  Right Foot Debridement, VAC Application  . Amputation Right 08/16/2012    Procedure: AMPUTATION BELOW KNEE;  Surgeon: Marybelle Killings, MD;  Location: [facility];  Service: Orthopedics;  Laterality: Right;  Right Below Knee Amputation  . Amputation Right 10/04/2012    Procedure: AMPUTATION BELOW KNEE REVISION;  Surgeon: Marybelle Killings, MD;  Location: [facility];  Service: Orthopedics;  Laterality: Right;  . Insertion of suprapubic catheter N/A 03/02/2013    Procedure: INSERTION OF SUPRAPUBIC CATHETER;  Surgeon: Lowella Bandy, MD;  Location: [facility];  Service: Urology;  Laterality: N/A;  . Tonsillectomy  ~ 1967    Allergies: No Known Allergies  Medications:   Prior to Admission medications   Medication Sig Start Date End Date Taking? Authorizing Provider  amLODipine (NORVASC) 10 MG tablet Take 1 tablet (10 mg total) by mouth daily. 10/09/13  Yes Willeen Niece, MD  aspirin 81 MG tablet Take 81 mg by mouth at bedtime.    Yes Historical Provider, MD  atorvastatin (LIPITOR) 40 MG tablet Take 1 tablet (40 mg total) by mouth daily. 06/22/13  Yes Willeen Niece, MD  Brinzolamide-Brimonidine [facility]) 1-0.2 % SUSP Place 1 drop into the left eye 2 (two) times daily.   Yes Historical Provider, MD  doxycycline  (VIBRA-TABS) 100 MG tablet Take 1 tablet (100 mg total) by mouth 2 (two) times daily. 10/19/13 10/29/13 Yes Willeen Niece, MD  fluticasone (FLONASE) 50 MCG/ACT nasal spray Place 2 sprays into both nostrils daily. 10/01/13  Yes Willeen Niece, MD  insulin glargine (LANTUS) 100 UNIT/ML injection Inject 20 Units into the skin at bedtime.   Yes Historical Provider, MD  lisinopril (PRINIVIL,ZESTRIL) 20 MG tablet Take 1 tablet (20 mg total) by mouth daily. 05/04/13 (was stopped prior toadm) Yes Willeen Niece,  MD  metoprolol tartrate (LOPRESSOR) 25 MG tablet Take 1 tablet (25 mg total) by mouth 2 (two) times daily. 05/04/13  Yes Willeen Niece, MD  montelukast (SINGULAIR) 10 MG tablet Take 1 tablet (10 mg total) by mouth at bedtime. 08/07/13  Yes Willeen Niece, MD  mupirocin ointment (BACTROBAN) 2 % Apply 1 application topically 2 (two) times daily. Apply to affecte 08/13/13  Yes Andrena Mews, MD  omeprazole (PRILOSEC) 40 MG capsule Take 1 capsule (40 mg total) by mouth daily. 10/09/13  Yes Willeen Niece, MD  oxybutynin (DITROPAN) 5 MG tablet Take 1 tablet (5 mg total) by mouth every 8 (eight) hours as needed for bladder spasms. 05/18/13  Yes Kalman Drape, MD    Discontinued Meds:   Medications Discontinued During This Encounter  Medication Reason  . insulin glargine (LANTUS) injection 20 Units   . oxyCODONE-acetaminophen (PERCOCET/ROXICET) 5-325 MG per tablet Patient has not taken in last 30 days  . prednisoLONE acetate (PRED FORTE) 1 % ophthalmic suspension Change in therapy  . enoxaparin (LOVENOX) injection 30 mg     Family History:  History reviewed. No pertinent family history.   Social History:  reports that he has never smoked. He has never used smokeless tobacco. He reports that he drinks alcohol. He reports that he does not use illicit drugs.Unmarried, no children. Family = sister, cousins   Review of Systems  Constitutional: Positive for malaise/fatigue. Negative for fever, chills, weight loss and  diaphoresis.  HENT: Negative for congestion and sore throat.   Respiratory: Positive for cough, sputum production and shortness of breath.   Cardiovascular: Positive for orthopnea, leg swelling and PND. Negative for chest pain, palpitations and claudication.  Gastrointestinal: Negative for nausea, vomiting, abdominal pain and diarrhea.  Genitourinary: Negative for dysuria, urgency, frequency, hematuria and flank pain.       Has suprapubic tube that has been clamped and has been voiding normally per urethra  Musculoskeletal: Negative for back pain and myalgias.  Neurological: Negative for dizziness, tingling, focal weakness, weakness and headaches.  Psychiatric/Behavioral: Negative for depression.    Physical Exam Blood pressure 153/65, pulse 99, temperature 99.1 F (37.3 C), temperature source Oral, resp. rate 20, height 5' 6" (1.676 m), weight 214 lb 15.2 oz (97.5 kg), SpO2 96.00%.  General: Morbidly obese male, alert, cooperative, NAD. HEENT: PERRL, EOMI. Moist mucus membranes. Poor dentition. Neck: Full range of motion without pain, supple, no lymphadenopathy or carotid bruits Lungs: Poor inspiratory effort. Air entry equal bilaterally, mild bibasilar crackles. + faint end exp wheezes Heart: RRR, no murmurs, gallops, or rubs Abdomen: Soft, non-tender, non-distended, BS +. Suprapubic catheter tube present.  Extremities: Right leg BKA w/ prosthesis. LLE w/ +3 pitting edema (also w/ non-pitting component) extending to knee and to thigh.  Neurologic: Alert & oriented X3, cranial nerves II-XII intact, strength grossly intact, sensation intact to light touch   Results for orders placed during the hospital encounter of 10/23/13 (from the past 48 hour(s))  PRO B NATRIURETIC PEPTIDE     Status: Abnormal   Collection Time    10/23/13 11:49 AM      Result Value Ref Range   Pro B Natriuretic peptide (BNP) 1661.0 (*) 0 - 125 pg/mL  TROPONIN I     Status: None   Collection Time    10/23/13  11:49 AM      Result Value Ref Range   Troponin I <0.30  <0.30 ng/mL   Comment:  Due to the release kinetics of cTnI,     a negative result within the first hours     of the onset of symptoms does not rule out     myocardial infarction with certainty.     If myocardial infarction is still suspected,     repeat the test at appropriate intervals.  GLUCOSE, CAPILLARY     Status: None   Collection Time    10/23/13 11:53 AM      Result Value Ref Range   Glucose-Capillary 71  70 - 99 mg/dL  COMPREHENSIVE METABOLIC PANEL     Status: Abnormal   Collection Time    10/23/13 12:30 PM      Result Value Ref Range   Sodium 144  137 - 147 mEq/L   Potassium 4.6  3.7 - 5.3 mEq/L   Chloride 111  96 - 112 mEq/L   CO2 19  19 - 32 mEq/L   Glucose, Bld 97  70 - 99 mg/dL   BUN 47 (*) 6 - 23 mg/dL   Creatinine, Ser 2.67 (*) 0.50 - 1.35 mg/dL   Calcium 9.2  8.4 - 10.5 mg/dL   Total Protein 6.8  6.0 - 8.3 g/dL   Albumin 2.7 (*) 3.5 - 5.2 g/dL   AST 12  0 - 37 U/L   ALT 13  0 - 53 U/L   Alkaline Phosphatase 151 (*) 39 - 117 U/L   Total Bilirubin <0.2 (*) 0.3 - 1.2 mg/dL   GFR calc non Af Amer 26 (*) >90 mL/min   GFR calc Af Amer 30 (*) >90 mL/min   Comment: (NOTE)     The eGFR has been calculated using the CKD EPI equation.     This calculation has not been validated in all clinical situations.     eGFR's persistently <90 mL/min signify possible Chronic Kidney     Disease.   Anion gap 14  5 - 15  MAGNESIUM     Status: None   Collection Time    10/23/13 12:30 PM      Result Value Ref Range   Magnesium 2.1  1.5 - 2.5 mg/dL  PHOSPHORUS     Status: Abnormal   Collection Time    10/23/13 12:30 PM      Result Value Ref Range   Phosphorus 4.7 (*) 2.3 - 4.6 mg/dL  CBC WITH DIFFERENTIAL     Status: Abnormal   Collection Time    10/23/13 12:30 PM      Result Value Ref Range   WBC 5.0  4.0 - 10.5 K/uL   RBC 3.16 (*) 4.22 - 5.81 MIL/uL   Hemoglobin 8.5 (*) 13.0 - 17.0 g/dL   HCT 25.4 (*)  39.0 - 52.0 %   MCV 80.4  78.0 - 100.0 fL   MCH 26.9  26.0 - 34.0 pg   MCHC 33.5  30.0 - 36.0 g/dL   RDW 14.4  11.5 - 15.5 %   Platelets 156  150 - 400 K/uL   Neutrophils Relative % 71  43 - 77 %   Neutro Abs 3.6  1.7 - 7.7 K/uL   Lymphocytes Relative 16  12 - 46 %   Lymphs Abs 0.8  0.7 - 4.0 K/uL   Monocytes Relative 11  3 - 12 %   Monocytes Absolute 0.5  0.1 - 1.0 K/uL   Eosinophils Relative 2  0 - 5 %   Eosinophils Absolute 0.1  0.0 - 0.7 K/uL  Basophils Relative 0  0 - 1 %   Basophils Absolute 0.0  0.0 - 0.1 K/uL  D-DIMER, QUANTITATIVE     Status: Abnormal   Collection Time    10/23/13 12:30 PM      Result Value Ref Range   D-Dimer, Quant 0.81 (*) 0.00 - 0.48 ug/mL-FEU   Comment:            AT THE INHOUSE ESTABLISHED CUTOFF     VALUE OF 0.48 ug/mL FEU,     THIS ASSAY HAS BEEN DOCUMENTED     IN THE LITERATURE TO HAVE     A SENSITIVITY AND NEGATIVE     PREDICTIVE VALUE OF AT LEAST     98 TO 99%.  THE TEST RESULT     SHOULD BE CORRELATED WITH     AN ASSESSMENT OF THE CLINICAL     PROBABILITY OF DVT / VTE.  URINALYSIS, ROUTINE W REFLEX MICROSCOPIC     Status: Abnormal   Collection Time    10/23/13  3:45 PM      Result Value Ref Range   Color, Urine YELLOW  YELLOW   APPearance CLOUDY (*) CLEAR   Specific Gravity, Urine 1.015  1.005 - 1.030   pH 5.5  5.0 - 8.0   Glucose, UA 100 (*) NEGATIVE mg/dL   Hgb urine dipstick MODERATE (*) NEGATIVE   Bilirubin Urine NEGATIVE  NEGATIVE   Ketones, ur NEGATIVE  NEGATIVE mg/dL   Protein, ur >300 (*) NEGATIVE mg/dL   Urobilinogen, UA 0.2  0.0 - 1.0 mg/dL   Nitrite POSITIVE (*) NEGATIVE   Leukocytes, UA MODERATE (*) NEGATIVE  PROTEIN / CREATININE RATIO, URINE     Status: Abnormal   Collection Time    10/23/13  3:45 PM      Result Value Ref Range   Creatinine, Urine 62.58     Total Protein, Urine 504.2     Comment: NO NORMAL RANGE ESTABLISHED FOR THIS TEST   PROTEIN CREATININE RATIO 8.06 (*) 0.00 - 0.15  URINE MICROSCOPIC-ADD ON      Status: Abnormal   Collection Time    10/23/13  3:45 PM      Result Value Ref Range   Squamous Epithelial / LPF RARE  RARE   WBC, UA 21-50  <3 WBC/hpf   RBC / HPF 3-6  <3 RBC/hpf   Bacteria, UA MANY (*) RARE   Casts HYALINE CASTS (*) NEGATIVE   Comment: GRANULAR CAST  GLUCOSE, CAPILLARY     Status: Abnormal   Collection Time    10/23/13  4:20 PM      Result Value Ref Range   Glucose-Capillary 129 (*) 70 - 99 mg/dL  TROPONIN I     Status: None   Collection Time    10/23/13  6:11 PM      Result Value Ref Range   Troponin I <0.30  <0.30 ng/mL   Comment:            Due to the release kinetics of cTnI,     a negative result within the first hours     of the onset of symptoms does not rule out     myocardial infarction with certainty.     If myocardial infarction is still suspected,     repeat the test at appropriate intervals.  GLUCOSE, CAPILLARY     Status: Abnormal   Collection Time    10/23/13  8:43 PM      Result Value Ref  Range   Glucose-Capillary 162 (*) 70 - 99 mg/dL  BASIC METABOLIC PANEL     Status: Abnormal   Collection Time    10/24/13  5:35 AM      Result Value Ref Range   Sodium 143  137 - 147 mEq/L   Potassium 4.7  3.7 - 5.3 mEq/L   Chloride 110  96 - 112 mEq/L   CO2 17 (*) 19 - 32 mEq/L   Glucose, Bld 186 (*) 70 - 99 mg/dL   BUN 46 (*) 6 - 23 mg/dL   Creatinine, Ser 3.00 (*) 0.50 - 1.35 mg/dL   Calcium 8.9  8.4 - 10.5 mg/dL   GFR calc non Af Amer 22 (*) >90 mL/min   GFR calc Af Amer 26 (*) >90 mL/min   Comment: (NOTE)     The eGFR has been calculated using the CKD EPI equation.     This calculation has not been validated in all clinical situations.     eGFR's persistently <90 mL/min signify possible Chronic Kidney     Disease.   Anion gap 16 (*) 5 - 15  CBC     Status: Abnormal   Collection Time    10/24/13  5:35 AM      Result Value Ref Range   WBC 4.3  4.0 - 10.5 K/uL   RBC 3.07 (*) 4.22 - 5.81 MIL/uL   Hemoglobin 8.3 (*) 13.0 - 17.0 g/dL    HCT 24.7 (*) 39.0 - 52.0 %   MCV 80.5  78.0 - 100.0 fL   MCH 27.0  26.0 - 34.0 pg   MCHC 33.6  30.0 - 36.0 g/dL   RDW 14.6  11.5 - 15.5 %   Platelets 158  150 - 400 K/uL  HEPARIN LEVEL (UNFRACTIONATED)     Status: Abnormal   Collection Time    10/24/13  5:35 AM      Result Value Ref Range   Heparin Unfractionated 0.82 (*) 0.30 - 0.70 IU/mL   Comment:            IF HEPARIN RESULTS ARE BELOW     EXPECTED VALUES, AND PATIENT     DOSAGE HAS BEEN CONFIRMED,     SUGGEST FOLLOW UP TESTING     OF ANTITHROMBIN III LEVELS.  GLUCOSE, CAPILLARY     Status: Abnormal   Collection Time    10/24/13  6:05 AM      Result Value Ref Range   Glucose-Capillary 164 (*) 70 - 99 mg/dL    X-ray Chest Pa And Lateral   10/23/2013   CLINICAL DATA:  Shortness of breath for several days  EXAM: CHEST  2 VIEW  COMPARISON:  10/19/2013  FINDINGS: Cardiac shadow is enlarged but stable. Bibasilar infiltrates are again noted with increasing pleural effusions bilaterally. No acute bony abnormality is seen.  IMPRESSION: Increasing bibasilar infiltrates with associated effusions.   Electronically Signed   By: Inez Catalina M.D.   On: 10/23/2013 14:38   US Renal  10/23/2013   CLINICAL DATA:  Acute renal failure.  EXAM: RENAL/URINARY TRACT ULTRASOUND COMPLETE  COMPARISON:  06/01/2012  FINDINGS: Right Kidney:  Length: 13 cm. Mild increased renal parenchymal echogenicity and renal cortical thinning. No mass or stone. No hydronephrosis.  Left Kidney:  Length: 14 cm. Echogenicity within normal limits. No mass or hydronephrosis visualized.  Bladder:  Decompressed by a Foley catheter.  IMPRESSION: 1. No hydronephrosis. 2. Mild increased renal parenchymal echogenicity on the right with mild renal  cortical thinning suggesting medical renal disease.   Electronically Signed   By: Lajean Manes M.D.   On: 10/23/2013 15:17    Assessment/Plan: 53 y.o. male w/ PMHx of HTN, HLD, DM type II, s/p right BKA, chronic suprapubic catheter, and CKD  admitted for worsening shortness of breath and acute on chronic renal failure.   Acute on CKD- CKD stage 3 associated w/ poorly controlled HTN and DM type II. Baseline appears to be ~1.2, however, it has increased more to the 2.0 range recently (2015) . Suspect acute worsening may be associated w/recent respiratory illness vs new onset CHF. Patient denies recent NSAID use. May also be establishing a new baseline of his CKD. Renal US suggestive only of medical renal disease, w/ no hydronephrosis. Additionally UA w/ >300 protein and urine protein creatinine ratio 8.06 c/w nephrotic range proteinuria. Currently net -800 mL since admission.  -Continue to hold ACEI  Dyspnea/cough- Suspect this is likely 2/2 new onset CHF likely exacerbated by nephrotic syndrome given his recent worsening of LE edema, PND, orthopnea, bibasilar crackles on exam, and bilateral pleural effusions/infiltrates and 8 grams proteinuria. LLE doppler negative. Patient did not tolerate V/Q scan given his dyspnea w/ lying flat. ECHO pending.  History of urinary retention w/ suprapubic catheter- Patient claims he has been passing urine normally recently, saw Dr. Janice Norrie who plugged suprapubic for now. Patient claims he is having adequate urine output. [place] does not suggest hydronephrosis.  -Continue to monitor I/O's Secondary Hyperparathyroidism- Previous PTH in 05/2012 was 322. Started on Zemplar by Dr. Mercy Moore in 06/2012, but it is unclear if patient has been taking this (not on medication list either). Noted to have elevated phos of 4.7 on admission.  -Repeat PTH Anemia- Most likely 2/2 iron deficiency as well as CKD. Iron from 10/19/13 low at 28 w/ elevated ferritin.  -Start Ferrous sulfate 325 mg bid (but consider repleting IV once volume status is better) HTN- 140-170/60-80 since admission. Currently on Norvasc 10 mg qd + Lopressor 25 mg bid. Continue strict BP control.  DM type II- Most recent HbA1c 7.5. Continue current insulin regimen.     Luanne Bras 10/24/2013, 9:31 AM  PGY-2, Internal Medicine Pager: [phone] Patient seen and examined with Dr. Ronnald Ramp. Patient is 53 yo WM with DM, HTN, HLD, prior BKA d/t osteo with AKI on CKD at that time.  Has a SP catheter for urinary retention but has been voiding well recently (with no hydro on a recent US) Has had proteinuria since at least 2011, with baseline creatinine that has been increasing over 2015.  He is presently admitted with worsening SOB, relatively new onset edema by his history, PND, orthopnea and worsening renal function on the tail end of an illness thought c/w PNA.  On exam he is grossly volume overloaded with pitting edema to the thigh, crackles, JVD, CXR with (to my eye) effusions and probable component of interstitial edema. Creatinine is up to 3, urine protein:creatinine ratio 8 grams of proteinuria, c/w nephrotic syndrome.  His course is compatible with diabetic nephropathy (although I am somewhat bothered by the seeming acuity of onset of his edema). Also wonder about a cardiorenal component as a contributor. At this point would diurese with lasix (in addition to focus on BP and DM control as you are doing, check echo.  He has had prior elevations in PTH but has not been on recent medication - check intact PTH.  His anemia is in part due to iron deficiency - po Fe  has been added but would consider IV ferrleccit once volume status is better. Appreciate the consult. Will follow closely with you.

## 2013-10-24 NOTE — Progress Notes (Signed)
*  PRELIMINARY RESULTS* Echocardiogram 2D Echocardiogram has been performed.  Lawrence [name] 10/24/2013, 10:55 AM

## 2013-10-24 NOTE — Discharge Summary (Signed)
Family Medicine Teaching [facility] Discharge Summary  Patient name: Lawrence [name] Medical record number: [id number] Date of birth: February 26, 1960 Age: 53 y.o. Gender: male Date of Admission: 10/23/2013  Date of Discharge: 11/02/2013 Admitting Physician: [name], MD  Primary Care Provider: [name], MD Consultants: Nephrology, Vascular surgery  Indication for Hospitalization: Dyspnea, orthopnea, AKI  Discharge Diagnoses/Problem List:  AoCKD T2DM HTN HLD Chronic Anemia UTI in setting of chronic indwelling suprapubic catheter  Disposition: Home  Discharge Condition: Improved  Discharge Exam: Please see progress note for 11/02/2013  Brief Hospital Course:  Lawrence [name] is a 53 y.o. male who presented with worsened dyspnea, orthopnea and found to have AKI. PMH is significant for chronic cough, CKD, poorly controlled HTN and DM, HLD, neuropathy, acute frontal sinusitis in August, and urinary retention.   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 Patient presented with Creatinine 2.78 on admission up from baseline over past 6 months of 1.6-1.7, prior to that 1.2 (11/2012). This did not appear to be a prerenal etiology on clinical exam as patient appeared volume overloaded (pitting edema and bibasilar crackles on lung exam). Renal work up was started and nephrology was consulted. Renal US showed no hydronephrosis, mild renal cortical thinning suggesting medical renal disease. UA was remarkable for >300 protein, positive nitrites, and positive leukocytes. UPC was elevated at 8.06, consistent with nephrotic syndrome, most likely diabetic nephropathy. Per nephrology, the patient was started on aggressive IV diuresis. Further work up was remarkable for negative ANCA, ANA, and complement levels. SPEP/UPEP were obtained which showed a possible restricted band in the gamma region and a polyclonal increase in free kappa and  lambda light chains. Renal biopsy was performed, and the pathology report was pending at the time of discharge. As the patient will likely need hemodialysis in the near future, vascular surgery was consulted and the patient had an right brachiocephalic AVF placed prior to discharge. He was eventually transitioned to his home lasix regimen, and continued to have adequate diuresis. He diuresed approximately 12L during his admission and his renal function was stable (Cr 3.79) at the time of discharge. He will follow up with vascular surgery and nephrology as an outpatient.   #Dyspnea secondary to volume overload likely secondary to AoCKD. Patient reported significant orthopnea, dyspnea, and chest tightness on admission. Differential included CHF vs renal failure causing volume overload vs untreated pneumonia vs pulmonary embolus. Patient had an elevated D-dimer, but was unable to tolerate a V/Q scan due to orthopnea, and thus was started on empiric heparin. CXR showed bibasilar infiltrates and edema. He had received 5 days of doxycline prior to arrival and, despite a low suspicion for pneumonia (afebrile, normal WBC), he received a complete course of antibiotics while here. Additionally, given the low clinical suspicion for a PE (negative LE dopplers, lack of hypoxia and tachycardia, improvement of dyspnea with diuresis), V/Q scan was not reattempted and heparin was stopped.. Troponin was negative x2 and the patient had an EKG which showed NSR with no acute ischemic changes.   Patient's BNP was elevated to 1661 on admission. Echocardiogram was done which showed an EF of 50%, however was a limited study due to the inability to position the patient. Patient's dyspnea and orthopnea improved with diuresis.   #UTI in setting of chronic indwelling suprapubic catheter. UA on admission was remarkable for protein, nitrites, and leukocyte, and many bacteria, however the patient was asymptomatic. Urine culture grew serratia  fonticola, however patient was not given further antibiotics as he is likely chronically colonized due to chronic indwelling suprapubic catheter (which was placed secondary to urinary retention and bladder spasms). Patient's suprapubic catheter was clamped while here and he was able to pass urine adequately through his urethra. He remained asymptomatic.   #Chronic anemia. Patient's labs showed normocytic, elevated ferritin, low iron, likely ACD secondary to CKD, with iron deficiency contributing. We continued iron repletion while here. His CBC was closely monitored and he required 1U pRBC during his stay.   #Poorly controlled HTN. Controlled here on home norvasc and metoprolol. Held home lisinopril in setting of AoCKD, restarted at discharge.  #Secondary Hyperparathyroidism. PTH 243 here. Started on calcitriol.   #DM. Controlled here on lantus and SSI. DM educator was consulted.   #HLD. Continued lipitor  daily.    Issues for Follow Up:  1) Volume status 2) Follow up creatinine and CBC 3) Will need repeat echo as outpatient secondary to poor study here due to orthopnea 4) Consider colonoscopy if not already done given iron deficiency anemia 5) Will need follow up with Dr Brunilda Payor for suprapubic catheter removal   Significant Procedures: Right brachiocephalic AVF placement  Significant Labs and Imaging:   Recent Labs Lab 10/31/13 0518 11/01/13 0456 11/02/13 0648  WBC 6.4 7.4 7.3  HGB 8.5* 8.7* 8.5*  HCT 25.7* 25.4* 25.3*  PLT 183 193 210    Recent Labs Lab 10/29/13 0230 10/30/13 0547 10/31/13 0518 11/01/13 0456 11/02/13 0648  NA 138 137 139 138 142  K 4.3 4.1 4.0 4.0 4.1  CL 104 101 103 102 104  CO2 GLUCOSE 183* 214* 191* 202* 127*  BUN 65* 68* 71* 68* 67*  CREATININE 4.00* 4.16* 4.22* 3.89* 3.79*  CALCIUM 8.6 8.7 8.9 9.0 9.1  PHOS 5.6* 5.7* 5.4* 5.5* 5.4*  ALBUMIN 2.3* 2.3* 2.5* 2.3* 2.5*    Recent Labs Lab 11/01/13 1314 11/01/13 1622  11/01/13 2134 11/02/13 0623 11/02/13 1120  GLUCAP 138* 105* 91 134* 208*   X-ray Chest Pa And Lateral  10/23/2013   IMPRESSION: Increasing bibasilar infiltrates with associated effusions.     US Renal 10/23/2013    IMPRESSION: 1. No hydronephrosis. 2. Mild increased renal parenchymal echogenicity on the right with mild renal cortical thinning suggesting medical renal disease.    Results/Tests Pending at Time of Discharge: Renal biopsy pathology  Discharge Medications:    Medication List    STOP taking these medications       doxycycline 100 MG tablet  Commonly known as:  VIBRA-TABS      TAKE these medications       amLODipine 10 MG tablet  Commonly known as:  NORVASC  Take 1 tablet (10 mg total) by mouth daily.     aspirin 81 MG tablet  Take 81 mg by mouth at bedtime.     atorvastatin 40 MG tablet  Commonly known as:  LIPITOR  Take 1 tablet (40 mg total) by mouth daily.     calcitRIOL 0.25 MCG capsule  Commonly known as:  ROCALTROL  Take 1 capsule (0.25 mcg total) by mouth daily.     fluticasone 50 MCG/ACT nasal spray  Commonly known as:  FLONASE  Place 2 sprays into both nostrils daily.     furosemide 40 MG tablet  Commonly known as:  LASIX  Take 1 tablet (40 mg total) by mouth 2 (two) times daily.     insulin glargine 100 UNIT/ML  injection  Commonly known as:  LANTUS  Inject 20 Units into the skin at bedtime.     lisinopril 20 MG tablet  Commonly known as:  PRINIVIL,ZESTRIL  Take 1 tablet (20 mg total) by mouth daily.     metoprolol tartrate 25 MG tablet  Commonly known as:  LOPRESSOR  Take 1 tablet (25 mg total) by mouth 2 (two) times daily.     montelukast 10 MG tablet  Commonly known as:  SINGULAIR  Take 1 tablet (10 mg total) by mouth at bedtime.     mupirocin ointment 2 %  Commonly known as:  BACTROBAN  Apply 1 application topically 2 (two) times daily. Apply to affecte     omeprazole 40 MG capsule  Commonly known as:  PRILOSEC  Take 1  capsule (40 mg total) by mouth daily.     oxybutynin 5 MG tablet  Commonly known as:  DITROPAN  Take 1 tablet (5 mg total) by mouth every 8 (eight) hours as needed for bladder spasms.     SIMBRINZA 1-0.2 % Susp  Generic drug:  Brinzolamide-Brimonidine  Place 1 drop into the left eye 2 (two) times daily.        Discharge Instructions: Please refer to Patient Instructions section of EMR for full details.  Patient was counseled important signs and symptoms that should prompt return to medical care, changes in medications, dietary instructions, activity restrictions, and follow up appointments.   Follow-Up Appointments: Follow-up Information   Follow up with Uvaldo Rising, MD On 11/12/2013. (11:30, Hospital follow up)    Specialty:  Family Medicine   Contact information:   [address] [place] [address] [phone]       Follow up with [facility] On 11/09/2013. (11:30 for lab draw)       Schedule an appointment as soon as possible for a visit with Sadie Haber, MD.   Specialty:  Nephrology   Contact information:   [address] [place] [address] [phone]       Schedule an appointment as soon as possible for a visit with Danae Chen, MD. (for catheter removal)    Specialty:  Urology   Contact information:   [address] [place] [address] [phone]       Jacquiline Doe, MD 11/02/2013, 10:20 PM PGY-1, [facility] Family Medicine

## 2013-10-24 NOTE — Progress Notes (Signed)
Family Medicine Teaching Service Daily Progress Note Intern Pager: [phone]  Patient name: Lawrence [name] Medical record number: [id number] Date of birth: [birth year] Age: 53 y.o. Gender: male  Primary Care Provider: [name], MD Consultants: Nephrology Code Status: Full  Pt Overview and Major Events to Date:  9/15 - admitted with dyspnea and ARF  Assessment and Plan: Lawrence [name] is a 53 y.o. male presenting with worsened dyspnea and ARF. PMH is significant for chronic cough, CKD, poorly controlled HTN and DM, HLD, neuropathy, acute frontal sinusitis in August, and urinary retention.   #AoCKD. Cr 2.78 on admission up from baseline over past 6 months of 1.6-1.7, prior to that 1.2 (11/2012). Patient does not appear prerenal on exam. Most likely etiology is sequelae from poorly controlled HTN and DM. - Nephrology consulted, appreciate assistance - Renal US: No hydronephrosis, mild renal cortical thinning suggesting medical renal disease - UPC: 8.06 - UA remarkable for >300 protein, nitrites, leukocytes  #Dyspnea. DDx includes new CHF in pt at risk for silent MI, renal failure causing fluid overload, untreated pneumonia, or PE. Patient remains orthopneic and has bibasilar crackles. Patient with elevated D-dimer to 0.81. - Patient unable to tolerate V/Q scan due to orthopnea, will re-attempt once orthopnea improves - Empiric heparin gtt per pharmacy until V/Q scan to r/o PE - Lower extremity dopplers with no evidence of DVT - BNP 1661.0 - f/u 2D Echocardiogram - CXR with bibasilar infiltrates  - Will hold off on antibiotics for now as patient is afebrile and has normal WBC. - EKG shows NSR, troponin negative x2 - Continue flonase, PPI, and aspirin  daily.   #Pyuria. UA remarkable for protein, nitrites, and leukocyte, and many bacteria. 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 Patient is currently  asymptomatic. - f/u urine culture  #Anemia. Normocytic, elevated ferritin, low iron. Likely ACD secondary to CKD, possibly iron deficiency. - HgB stable here, continue to monitor - Colonoscopy recommendation if not already done at 50.   #Poorly controlled HTN  - Continue norvasc, metoprolol  BID  - Continue to hold lisinopril in setting of AoCKD.   #DM. On 20U lantus at home.  - Continue lantus but decrease to 15 units daily (from 20) in setting of worsened renal function, and added sensitive SSI.  - DM educator consulted.   #HLD  - Continue lipitor  daily.  FEN/GI: SLIV, Heart Healthy/Carb Modified diet  Prophylaxis: SQ Lovenox with renal correction  Disposition: Admitted to inpatient status pending above evaluation.   Subjective:  NAEON. Still orthopneic this morning. Unable to lie flat for V/Q scan. No chest pain.   Objective: Temp:  [97.6 F (36.4 C)-99.1 F (37.3 C)] 99.1 F (37.3 C) (09/16 0419) Pulse Rate:  [65-99] 99 (09/16 0419) Resp:  [20] 20 (09/16 0419) BP: (146-166)/(65-79) 153/65 mmHg (09/16 0419) SpO2:  [96 %-97 %] 96 % (09/16 0419) Weight:  [214 lb 15.2 oz (97.5 kg)-223 lb (101.152 kg)] 214 lb 15.2 oz (97.5 kg) (09/16 0419) Physical Exam: General: NAD, sitting in chair beside bed Cardiovascular: RRR, no murmurs appreciated Respiratory: Minimally increased work of breathing. Poor inspiratory effort. CTAB with crackles noted.  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     US Renal 10/23/2013   IMPRESSION: 1. No hydronephrosis. 2. Mild increased renal parenchymal echogenicity on the right with mild renal cortical thinning suggesting medical renal disease.      Jacquiline Doe, MD 10/24/2013, 6:59 AM PGY-1, [facility] Family Medicine FPTS Intern pager: [phone], text pages welcome

## 2013-10-25 DIAGNOSIS — N390 Urinary tract infection, site not specified: Secondary | ICD-10-CM

## 2013-10-25 LAB — CBC
HEMATOCRIT: 20.8 % — AB (ref 39.0–52.0)
HEMATOCRIT: 23 % — AB (ref 39.0–52.0)
Hemoglobin: 6.9 g/dL — CL (ref 13.0–17.0)
Hemoglobin: 7.8 g/dL — ABNORMAL LOW (ref 13.0–17.0)
MCH: 26.4 pg (ref 26.0–34.0)
MCH: 27.3 pg (ref 26.0–34.0)
MCHC: 33.2 g/dL (ref 30.0–36.0)
MCHC: 33.9 g/dL (ref 30.0–36.0)
MCV: 79.7 fL (ref 78.0–100.0)
MCV: 80.4 fL (ref 78.0–100.0)
Platelets: 149 10*3/uL — ABNORMAL LOW (ref 150–400)
Platelets: 158 10*3/uL (ref 150–400)
RBC: 2.61 MIL/uL — ABNORMAL LOW (ref 4.22–5.81)
RBC: 2.86 MIL/uL — AB (ref 4.22–5.81)
RDW: 14.4 % (ref 11.5–15.5)
RDW: 14.3 % (ref 11.5–15.5)
WBC: 4.4 10*3/uL (ref 4.0–10.5)
WBC: 4.3 10*3/uL (ref 4.0–10.5)

## 2013-10-25 LAB — RENAL FUNCTION PANEL
ALBUMIN: 2.2 g/dL — AB (ref 3.5–5.2)
Anion gap: 14 (ref 5–15)
BUN: 48 mg/dL — AB (ref 6–23)
CO2: 18 mEq/L — ABNORMAL LOW (ref 19–32)
Calcium: 8.7 mg/dL (ref 8.4–10.5)
Chloride: 107 mEq/L (ref 96–112)
Creatinine, Ser: 2.85 mg/dL — ABNORMAL HIGH (ref 0.50–1.35)
GFR calc Af Amer: 28 mL/min — ABNORMAL LOW (ref >90)
GFR calc non Af Amer: 24 mL/min — ABNORMAL LOW (ref >90)
Glucose, Bld: 168 mg/dL — ABNORMAL HIGH (ref 70–99)
PHOSPHORUS: 4.4 mg/dL (ref 2.3–4.6)
Potassium: 4.6 mEq/L (ref 3.7–5.3)
Sodium: 139 mEq/L (ref 137–147)

## 2013-10-25 LAB — GLUCOSE, CAPILLARY
GLUCOSE-CAPILLARY: 107 mg/dL — AB (ref 70–99)
Glucose-Capillary: 145 mg/dL — ABNORMAL HIGH (ref 70–99)
Glucose-Capillary: 132 mg/dL — ABNORMAL HIGH (ref 70–99)
Glucose-Capillary: 98 mg/dL (ref 70–99)

## 2013-10-25 LAB — PARATHYROID HORMONE, INTACT (NO CA): PTH: 243 pg/mL — AB (ref 14–64)

## 2013-10-25 LAB — HEPARIN LEVEL (UNFRACTIONATED): Heparin Unfractionated: 0.47 IU/mL (ref 0.30–0.70)

## 2013-10-25 MED ORDER — FUROSEMIDE 10 MG/ML IJ SOLN
80.0000 mg | Freq: Two times a day (BID) | INTRAMUSCULAR | Status: DC
  Administered 2013-10-25 – 2013-10-26 (×3): 80 mg via INTRAVENOUS
  Filled 2013-10-25 (×5): qty 8

## 2013-10-25 NOTE — Clinical Documentation Improvement (Signed)
Please clarify acuity and type of CHF if appropriate. Thank you.  . Document acuity --Acute --Chronic --Acute on Chronic . Document type --Diastolic --Systolic --Combined systolic and diastolic . Due to or associated with --Hypertension --Other (specify)  Supporting Information: He reports having persistent shortness of breath described as "wheezing", worse when he is lying flat in bed, endorses chronic cough and chronic worsening lower ext edema in Left leg  +4 pitting edema to knee  Lungs - Persistent bibasilar crackles heard, no wheezing or focal crackles. Mild inc work of breathing, without accessory muscle use or abdominal breathing.  H&P: Cough  DDx includes new CHF in pt at risk for silent MI, renal failure causing fluid overload, untreated pneumonia. No h/o CHF or other cardiac issue per pt. chest tightness and h/o poorly controlled DM and HTN   9/15 progress note: # Dyspnea  - Presumed likely new dx CHF (elevated ProBNP 1661, concern for effusion on CXR) vs fluid overload from ARF.  9/16 progress note:Dyspnea: New onset CHF vs Pulmonary embolism vs CAP.  9/17 progress note: Still claims to be having SOB, especially when lying flat. Still w/ cough this morning. Lungs: Poor inspiratory effort. Air entry equal bilaterally, still w/ mild bibasilar crackles. LLE w/ +3 pitting edema (also w/ non-pitting component) extending to thigh. SOB/cough- Felt that this was largely contributed to by possible underlying new onset CHF in combination w/ preceding respiratory illness and nephrotic syndrome. ECHO from yesterday shows EF of 50% w/ grade 2 diastolic dysfunction. Given Lasix 80 mg IV yesterday, now net -3.2L.    Tests: 9/16 Echo: EF 50% 9/15 BNP 1661.0 Treatments: Daily weights; strict ins and outs 2D Echocardiogram, BNP 2 view CXR: Chest xray showed increased bibasal infiltrate, patient however afebrile with stable white count  Treatments: IV Lasix  bid Daily  weights Strict I&O Echo BNP CXR   Thank You,  Diante Barley T. Luiz Ochoa, MSN, MBA/MHA Clinical Documentation Specialist [email or web address] Office # [phone]

## 2013-10-25 NOTE — Care Management Note (Signed)
    Page 1 of 1   11/02/2013     2:23:10 PM CARE MANAGEMENT NOTE 11/02/2013  Patient:  Lawrence [name], Lawrence [name]   Account Number:  [id number]  Date Initiated:  10/25/2013  Documentation initiated by:  Traeton Bordas  Subjective/Objective Assessment:   Pt adm on 9/15 with dyspnea, ARF, anemia.  PTA, pt resided at home with uncle.  States "we take care of each other."     Action/Plan:   Will follow for dc needs as pt progresses.  PT eval pending.   Anticipated DC Date:  10/31/2013   Anticipated DC Plan:  HOME W HOME HEALTH SERVICES  In-house referral  Clinical Social Worker      DC Planning Services  CM consult      Choice offered to / List presented to:             Status of service:  Completed, signed off Medicare Important Message given?   (If response is "NO", the following Medicare IM given date fields will be blank) Date Medicare IM given:   Medicare IM given by:   Date Additional Medicare IM given:   Additional Medicare IM given by:    Discharge Disposition:  HOME/SELF CARE  Per UR Regulation:  Reviewed for med. necessity/level of care/duration of stay  If discussed at Long Length of Stay Meetings, dates discussed:   10/30/2013  11/01/2013    Comments:  11/02/13 Sidney Ace, RN, BSN [phone] Pt for dc home today; needs transport home.  CSW consulted for transportation needs.  10/30/13 Sidney Ace, RN, BSN [phone] PT recommending no follow up at dc.  Pt may benefit from [facility] at dc for CHF follow up and restorative care.  MD, please order if you agree.  CSW consulted, as pt may need assistance with [facility] transportation at dc.

## 2013-10-25 NOTE — Progress Notes (Signed)
ANTICOAGULATION CONSULT NOTE - Follow Up Consult  Pharmacy Consult for heparin Indication: r/o PE  Labs:  Recent Labs  10/23/13 1149  10/23/13 1230 10/23/13 1811 10/24/13 0535 10/24/13 1410 10/25/13 0500 10/25/13 0530  HGB  --   < > 8.5*  --  8.3*  --   --  6.9*  HCT  --   --  25.4*  --  24.7*  --   --  20.8*  PLT  --   --  156  --  158  --   --  149*  HEPARINUNFRC  --   --   --   --  0.82* 0.72* 0.47  --   CREATININE  --   --  2.67*  --  3.00*  --   --   --   TROPONINI <0.30  --   --  <0.30  --   --   --   --   < > = values in this interval not displayed.   Assessment/Plan:  53yo male therapeutic on heparin after rate decreases, VQ scan pending. Will continue gtt at current rate and confirm stable with additional level.   Vernard Gambles, PharmD, BCPS  10/25/2013,6:21 AM

## 2013-10-25 NOTE — Progress Notes (Signed)
Family Medicine Teaching Service Daily Progress Note Intern Pager: [phone]  Patient name: Lawrence [name] Medical record number: [id number] Date of birth: August 05, 1960 Age: 53 y.o. Gender: male  Primary Care Provider: [name], MD Consultants: Nephrology Code Status: Full  Pt Overview and Major Events to Date:  9/15 - admitted with dyspnea and ARF 9/16 - Started on IV lasix for diuresis  Assessment and Plan: Lawrence [name] is a 53 y.o. male presenting with worsened dyspnea and ARF. PMH is significant for chronic cough, CKD, poorly controlled HTN and DM, HLD, neuropathy, and urinary retention.   #AoCKD. Cr 2.78 on admission up from baseline over past 6 months of 1.6-1.7, prior to that 1.2 (11/2012). Labs thus far most consistent with nephrotic syndrome (UPC 8). Most likely diabetic nephropathy. Appears clinically volume overloaded.  - Nephrology consulted, appreciate assistance - Continue HTN and DM management - Will proceed with diuresis with lasix, per nephrology - f/u PTH  #Dyspnea. DDx CHF vs renal failure causing fluid overload vs PNA untreated pneumonia vs PE. Patient remains orthopneic and has faint bibasilar crackles. Patient with elevated D-dimer to 0.81. - Patient unable to tolerate V/Q scan due to orthopnea, consider re-attempt once orthopnea improves - Holding heparin for now in setting of anemia and low clinical suspicion for PE - Lower extremity dopplers with no evidence of DVT - Echo: 50% EF, grade 2 diastolic dysfunction, though limited study due to inability to position patient - Will re-attempt Echo once orthopnea improves - CXR with bibasilar infiltrates  - Received 5 days of doxycycline prior to admission - Though the patient's white count is stable and he has been afebrile, will cover with antibiotics until clinical picture improves. - Levoquin (9/16- ) - Continue aspirin  daily.   #Pyuria. UA remarkable for protein, nitrites, and leukocyte, and many  bacteria. 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 Patient is currently asymptomatic. - f/u urine culture  #Anemia. Normocytic, elevated ferritin, low iron. Likely secondary to CKD, but possibly iron deficiency playing a role. - PO Fe - Colonoscopy recommendation if not already done at 50.  - Consider transfusion for HgB <7.0, will need to discuss with nephrology risk vs benefits of extra volume in already volume overloaded patient - f/u 1300 CBC  #Poorly controlled HTN  - Continue norvasc, metoprolol  BID  - Continue to hold lisinopril in setting of AoCKD.   #DM. On 20U lantus at home.  - Continue lantus but decrease to 15 units daily (from 20) in setting of worsened renal function, and added sensitive SSI.  - DM educator consulted.   #HLD  - Continue lipitor  daily.   #Chronic Cough -Continue home flonase, PPI -Consider switching from ACEi to ARB   FEN/GI: SLIV, Heart Healthy/Carb Modified diet  Prophylaxis: SQ Lovenox with renal correction  Disposition: Admitted to inpatient status pending above evaluation.   Subjective:  NAEON. Did not sleep well last night. No chest pain or shortness of breath. Continues to have orthopnea.   Objective: Temp:  [98.2 F (36.8 C)-98.4 F (36.9 C)] 98.2 F (36.8 C) (09/17 0341) Pulse Rate:  [76-79] 78 (09/17 0341) Resp:  [18-20] 18 (09/17 0341) BP: (138-150)/(61-66) 138/61 mmHg (09/17 0341) SpO2:  [96 %-97 %] 96 % (09/17 0341) Weight:  [211 lb 3.2 oz (95.8 kg)] 211 lb 3.2 oz (95.8 kg) (09/17 2956) Physical Exam: General: NAD, sitting in chair beside bed Cardiovascular: RRR, no murmurs appreciated Respiratory:  Minimally increased work of breathing. Poor inspiratory effort. CTAB with faint crackles noted.  Abdomen: Soft, nontender, nondistended Extremities: Right BKA, Left leg with 2+ edema to knee Skin: Warm, dry Neuro: Awake,  alert  Laboratory:  Recent Labs Lab 10/23/13 1230 10/24/13 0535 10/25/13 0530  WBC 5.0 4.3 4.4  HGB 8.5* 8.3* 6.9*  HCT 25.4* 24.7* 20.8*  PLT 156 158 149*    Recent Labs Lab 10/19/13 0916 10/23/13 1230 10/24/13 0535 10/25/13 0530  NA 142 144 143 139  K 4.9 4.6 4.7 4.6  CL 111 111 110 107  CO2 25 19 17* 18*  BUN 52* 47* 46* 48*  CREATININE 2.78* 2.67* 3.00* 2.85*  CALCIUM 8.4 9.2 8.9 8.7  PROT 5.6* 6.8  --   --   BILITOT 0.3 <0.2*  --   --   ALKPHOS 138* 151*  --   --   ALT 18 13  --   --   AST 12 12  --   --   GLUCOSE 133* 97 186* 168*    Imaging/Diagnostic Tests:   X-ray Chest Pa And Lateral 10/23/2013    IMPRESSION: Increasing bibasilar infiltrates with associated effusions.     US Renal 10/23/2013   IMPRESSION: 1. No hydronephrosis. 2. Mild increased renal parenchymal echogenicity on the right with mild renal cortical thinning suggesting medical renal disease.      Jacquiline Doe, MD 10/25/2013, 9:27 AM PGY-1,  Family Medicine FPTS Intern pager: [phone], text pages welcome

## 2013-10-25 NOTE — Progress Notes (Signed)
CRITICAL VALUE ALERT  Critical value received:  Hemoglobin 6.9   Date of notification:  9/17  Time of notification:  0606  Critical value read back yes   Nurse who received alert:  kds   MD notified (1st page):  On-call Family meds  Time of first page:  0615  MD notified (2nd page): on-call  Time of second page:0630  No response back on critical lab value. MD paged x2 by RN and x1 by charge nurse.

## 2013-10-25 NOTE — Progress Notes (Signed)
FMTS ATTENDING  NOTE Lawrence Matchett,MD I  have seen and examined this patient, reviewed their chart. I have discussed this patient with the resident. I agree with the resident's findings, assessment and care plan. Patient feels okay today, denies any SOB, no dizziness or weakness, denies any bleeding, no change in bowel color. Vital signs are stable. He is s/p 80 mg IV lasix X 1 and his kidney function improved some after. He does still have some edema, may consider another dose of Lasix. Urine culture is pending. Hgb dropped to 6.9 today, patient is totally asymptomatic, this may be dilutional but with such rapid drop will obtain stool guaiac to r/o bleeding, also hold off on heparin for now since PE is not confirmed and O2 sat is ok. Recheck CBC this afternoon, if continued to drop we will transfuse him.

## 2013-10-25 NOTE — Progress Notes (Signed)
Subjective:  Patient seen at bedside this AM, eating breakfast, no significant complaints. Still claims to be having SOB, especially when lying flat. Still w/ cough this morning.  Net -3.5L this morning.  Weight 211 lbs, decreased from 214 lbs yesterday morning.    Medications:  Infusions: . heparin 1,250 Units/hr (10/25/13 0700)   Scheduled Medications: . amLODipine  10 mg Oral Daily  . aspirin EC  81 mg Oral QHS  . atorvastatin  40 mg Oral Daily  . brimonidine  1 drop Left Eye BID  . brinzolamide  1 drop Left Eye BID  . ferrous sulfate  325 mg Oral BID WC  . fluticasone  2 spray Each Nare Daily  . insulin aspart  0-9 Units Subcutaneous TID WC  . insulin glargine  15 Units Subcutaneous QHS  . levofloxacin  750 mg Oral Q48H  . metoprolol tartrate  25 mg Oral BID  . pantoprazole  80 mg Oral Daily  . pneumococcal 23 valent vaccine  0.5 mL Intramuscular Tomorrow-1000  . sodium chloride  3 mL Intravenous Q12H   PRN Meds:.diphenhydrAMINE, oxybutynin, senna  BP 138/61  Pulse 78  Temp(Src) 98.2 F (36.8 C) (Oral)  Resp 18  Ht  (1.676 m)  Wt 211 lb 3.2 oz (95.8 kg)  BMI 34.10 kg/m2  SpO2 96%   Intake/Output Summary (Last 24 hours) at 10/25/13 1610 Last data filed at 10/25/13 9604  Gross per 24 hour  Intake    150 ml  Output   2900 ml  Net  -2750 ml    Weight change: -11 lb 12.8 oz (-5.352 kg)   EXAM:  General: Morbidly obese male, alert, cooperative, NAD. HEENT: PERRL, EOMI. Moist mucus membranes. Poor dentition. Neck: Full range of motion without pain, supple, no lymphadenopathy or carotid bruits Lungs: Poor inspiratory effort. Air entry equal bilaterally, still w/ mild bibasilar crackles.  Heart: RRR, no murmurs, gallops, or rubs Abdomen: Soft, non-tender, non-distended, BS +. Suprapubic catheter tube present.  Extremities: Right leg BKA w/ prosthesis. LLE w/ +3 pitting edema (also w/ non-pitting component) extending to thigh.  Neurologic: Alert & oriented  X3, cranial nerves II-XII intact, strength grossly intact, sensation intact to light touch   Labs: Basic Metabolic Panel:  Recent Labs Lab 10/23/13 1230 10/24/13 0535 10/25/13 0530  NA 144 143 139  K 4.6 4.7 4.6  CL 111 110 107  CO2 19 17* 18*  GLUCOSE 97 186* 168*  BUN 47* 46* 48*  CREATININE 2.67* 3.00* 2.85*  CALCIUM 9.2 8.9 8.7  MG 2.1  --   --   PHOS 4.7*  --  4.4    Liver Function Tests:  Recent Labs Lab 10/19/13 0916 10/23/13 1230 10/25/13 0530  AST 12 12  --   ALT 18 13  --   ALKPHOS 138* 151*  --   BILITOT 0.3 <0.2*  --   PROT 5.6* 6.8  --   ALBUMIN 3.0* 2.7* 2.2*   CBC:  Recent Labs Lab 10/19/13 0916 10/23/13 1230 10/24/13 0535 10/25/13 0530  WBC 6.2 5.0 4.3 4.4  NEUTROABS  --  3.6  --   --   HGB 8.1* 8.5* 8.3* 6.9*  HCT 24.1* 25.4* 24.7* 20.8*  MCV 80.9 80.4 80.5 79.7  PLT 127* 156 158 149*    Cardiac Enzymes:  Recent Labs Lab 10/23/13 1149 10/23/13 1811  TROPONINI <0.30 <0.30    CBG:  Recent Labs Lab 10/24/13 0605 10/24/13 1117 10/24/13 1633 10/24/13 2110 10/25/13 0628  GLUCAP  164* 121* 87 114* 145*    Studies/Results: X-ray Chest Pa And Lateral   10/23/2013   CLINICAL DATA:  Shortness of breath for several days  EXAM: CHEST  2 VIEW  COMPARISON:  10/19/2013  FINDINGS: Cardiac shadow is enlarged but stable. Bibasilar infiltrates are again noted with increasing pleural effusions bilaterally. No acute bony abnormality is seen.  IMPRESSION: Increasing bibasilar infiltrates with associated effusions.   Electronically Signed   By: Alcide Clever M.D.   On: 10/23/2013 14:38   US Renal  10/23/2013   CLINICAL DATA:  Acute renal failure.  EXAM: RENAL/URINARY TRACT ULTRASOUND COMPLETE  COMPARISON:  06/01/2012  FINDINGS: Right Kidney:  Length: 13 cm. Mild increased renal parenchymal echogenicity and renal cortical thinning. No mass or stone. No hydronephrosis.  Left Kidney:  Length: 14 cm. Echogenicity within normal limits. No mass or  hydronephrosis visualized.  Bladder:  Decompressed by a Foley catheter.  IMPRESSION: 1. No hydronephrosis. 2. Mild increased renal parenchymal echogenicity on the right with mild renal cortical thinning suggesting medical renal disease.   Electronically Signed   By: Amie Portland M.D.   On: 10/23/2013 15:17   Assessment/Plan: 53 y.o. male w/ PMHx of HTN, HLD, DM type II, s/p right BKA, chronic suprapubic catheter, and CKD stage 3, admitted w/ worsening SOB and acute on chronic renal failure w/ nephrotic range proteinuria.   Acute on Chronic CKD- CKD stage 3 associated w/ poorly controlled HTN and DM type II. Baseline appears to be ~1.2, however, it has increased more to the 2.0 range recently (2015). Renal US suggestive of medical renal disease, w/ no hydronephrosis. Additionally UA w/ >300 protein and urine protein creatinine ratio 8.06 c/w nephrotic range proteinuria. Most likely proteinuria is 2/2 diabetes, however, some concern for another etiology given relatively acute onset of symptoms. Now net -3.2L. According to recorded weights, patient is now 211 lbs, down from 223 lbs on admission (214 lbs yesterday). Cr. decreased to 2.85 this morning.  -Continue Lasix 80 mg IV bid today SOB/cough- Felt that this was largely contributed to by possible underlying new onset CHF in combination w/ preceding respiratory illness and nephrotic syndrome. ECHO from yesterday shows EF of 50% w/ grade 2 diastolic dysfunction. Given Lasix 80 mg IV yesterday, now net -3.2L. Do not suspect SOB is related to VTE.  -Lasix as above  -Recommend repeat 2v chest XR tomorrow -Suggest discontinuing Heparin gtt given decreased Hb given better explanation of pulmonary findings History of urinary retention w/ suprapubic catheter- Patient claims he has been passing urine normally recently, saw Dr. Brunilda Payor who plugged suprapubic for now. Patient claims he is having adequate urine output. [place] does not suggest hydronephrosis. UA suggestive  of infection on admission, however, asymptomatic, likely colonized given suprapubic catheter.  -Continue to monitor I/O's, daily weights.  Secondary Hyperparathyroidism- Previous PTH in 05/2012 was 322. Started on Zemplar by Dr. Briant Cedar in 06/2012, but it is unclear if patient has been taking this (not on medication list either). Noted to have elevated phos of 4.7 on admission.  -PTH pending Anemia- Thought to be 2/2 iron deficiency in combination w/ CKD, started on Ferrous sulfate 325 bid yesterday. Hb 6.9 this morning, however. Denies dark stools or hematochezia. Given volume status, would hold off on transfusion at this time given absence of signs/symptoms of hypovolemia. -FOBT -Continue Iron supplementation -Repeat CBC in PM HTN- BP well controlled this AM. Currently on Norvasc 10 mg qd + Lopressor 25 mg bid. Continue strict BP control.  DM type II- Most recent HbA1c 7.5. Continue current insulin regimen.    Signed: Lars Masson, MD 10/25/2013 8:24 AM  I have seen and examined this patient and agree with plan as outlined in the above note by Dr. Yetta Barre. Pt diuresing well with 80 mg doses of lasix so will continue 80 IV Q12H.  Creatinine slight;y improved today.  Drop in Hb worrisome. Need to r/o blood loss (has known anemia and Fe deficiency but that would not explain the acute drop). Since not symptomatic would hold off on transfusion until volume status a bit better. ? If pt truly has PNA and as for his urine, more suspicious of colonization than true infection, so wonder if levaquin necessary.  Yilin Weedon B,MD 10/25/2013 11:44 AM

## 2013-10-26 ENCOUNTER — Inpatient Hospital Stay (HOSPITAL_COMMUNITY): Payer: Medicaid Other

## 2013-10-26 LAB — GLUCOSE, CAPILLARY
GLUCOSE-CAPILLARY: 107 mg/dL — AB (ref 70–99)
GLUCOSE-CAPILLARY: 132 mg/dL — AB (ref 70–99)
GLUCOSE-CAPILLARY: 133 mg/dL — AB (ref 70–99)
Glucose-Capillary: 100 mg/dL — ABNORMAL HIGH (ref 70–99)

## 2013-10-26 LAB — CBC
HCT: 24.4 % — ABNORMAL LOW (ref 39.0–52.0)
HEMOGLOBIN: 8.2 g/dL — AB (ref 13.0–17.0)
MCH: 27.4 pg (ref 26.0–34.0)
MCHC: 33.6 g/dL (ref 30.0–36.0)
MCV: 81.6 fL (ref 78.0–100.0)
PLATELETS: 177 10*3/uL (ref 150–400)
RBC: 2.99 MIL/uL — AB (ref 4.22–5.81)
RDW: 14.3 % (ref 11.5–15.5)
WBC: 5 10*3/uL (ref 4.0–10.5)

## 2013-10-26 LAB — RENAL FUNCTION PANEL
ANION GAP: 15 (ref 5–15)
Albumin: 2.3 g/dL — ABNORMAL LOW (ref 3.5–5.2)
BUN: 49 mg/dL — AB (ref 6–23)
CHLORIDE: 104 meq/L (ref 96–112)
CO2: 22 meq/L (ref 19–32)
Calcium: 8.7 mg/dL (ref 8.4–10.5)
Creatinine, Ser: 3.27 mg/dL — ABNORMAL HIGH (ref 0.50–1.35)
GFR calc non Af Amer: 20 mL/min — ABNORMAL LOW (ref >90)
GFR, EST AFRICAN AMERICAN: 23 mL/min — AB (ref >90)
GLUCOSE: 104 mg/dL — AB (ref 70–99)
POTASSIUM: 4.1 meq/L (ref 3.7–5.3)
Phosphorus: 4.6 mg/dL (ref 2.3–4.6)
SODIUM: 141 meq/L (ref 137–147)

## 2013-10-26 MED ORDER — FUROSEMIDE 80 MG PO TABS: 80.0000 mg | ORAL_TABLET | Freq: Two times a day (BID) | ORAL | Status: DC

## 2013-10-26 MED ORDER — TRAZODONE HCL 50 MG PO TABS
50.0000 mg | ORAL_TABLET | Freq: Every evening | ORAL | Status: DC | PRN
  Administered 2013-10-26 – 2013-11-01 (×6): 50 mg via ORAL
  Filled 2013-10-26 (×6): qty 1

## 2013-10-26 MED ORDER — CALCITRIOL 0.25 MCG PO CAPS
0.2500 ug | ORAL_CAPSULE | Freq: Every day | ORAL | Status: DC
  Administered 2013-10-26 – 2013-11-02 (×8): 0.25 ug via ORAL
  Filled 2013-10-26 (×8): qty 1

## 2013-10-26 MED ORDER — BACITRACIN 500 UNIT/GM EX OINT
1.0000 "application " | TOPICAL_OINTMENT | Freq: Two times a day (BID) | CUTANEOUS | Status: DC
  Filled 2013-10-26 (×2): qty 0.9

## 2013-10-26 MED ORDER — BACITRACIN ZINC 500 UNIT/GM EX OINT
TOPICAL_OINTMENT | Freq: Two times a day (BID) | CUTANEOUS | Status: DC
  Administered 2013-10-26: 22:00:00 via TOPICAL
  Administered 2013-10-26: 1 via TOPICAL
  Administered 2013-10-27: 11:00:00 via TOPICAL
  Administered 2013-10-27: 1 via TOPICAL
  Administered 2013-10-28 – 2013-11-01 (×9): via TOPICAL
  Administered 2013-11-01: 1 via TOPICAL
  Administered 2013-11-02: 11:00:00 via TOPICAL
  Filled 2013-10-26: qty 28.35

## 2013-10-26 MED ORDER — FUROSEMIDE 10 MG/ML IJ SOLN: 80.0000 mg | Freq: Every day | INTRAMUSCULAR | Status: DC

## 2013-10-26 MED ORDER — FUROSEMIDE 80 MG PO TABS
80.0000 mg | ORAL_TABLET | Freq: Two times a day (BID) | ORAL | Status: DC
  Administered 2013-10-26 – 2013-10-27 (×3): 80 mg via ORAL
  Filled 2013-10-26 (×5): qty 1

## 2013-10-26 NOTE — Progress Notes (Signed)
Patient IV occluded today, unable to flush, no IV lasix given this am. New IV started, however, spoke with nephrology who wants to change to PO BID  lasix. Advised patient needed am dose, ordered and given, see MAR.

## 2013-10-26 NOTE — Progress Notes (Signed)
Family Medicine Teaching Service Daily Progress Note Intern Pager: [phone]  Patient name: Lawrence [name] Medical record number: [id number] Date of birth: 09-16-1960 Age: 53 y.o. Gender: male  Primary Care Provider: [name], MD Consultants: Nephrology Code Status: Full  Pt Overview and Major Events to Date:  9/15 - admitted with dyspnea and ARF 9/16 - Started on IV lasix for diuresis  Assessment and Plan: Lawrence [name] is a 53 y.o. male presenting with worsened dyspnea and ARF. PMH is significant for chronic cough, CKD, poorly controlled HTN and DM, HLD, neuropathy, and urinary retention.   #AoCKD. Cr 2.78 on admission up from baseline over past 6 months of 1.6-1.7, prior to that 1.2 (11/2012). Labs thus far most consistent with nephrotic syndrome (UPC 8). Most likely diabetic nephropathy. Appears clinically volume overloaded.  - Nephrology consulted, appreciate assistance - Continue HTN and DM management - Will proceed with diuresis with lasix, per nephrology  #Dyspnea likely due to volume overload in the setting of AoCKD. DDx CHF vs renal failure causing fluid overload. Unlikely to be PNA untreated pneumonia vs PE. Patient remains orthopneic and has faint bibasilar crackles. - Patient unable to tolerate V/Q scan due to orthopnea, will not pursue at this time given low suspicion for PE (negative LE dopplers, improvement of dyspnea with diuresis and lack of desats on room air). Can consider re-attempt if worsening or not improving clinically. - Echo: 50% EF, grade 2 diastolic dysfunction, though limited study due to inability to position patient - Will re-attempt Echo once orthopnea improves - CXR (9/18) with persistent bibasilar infiltrates, edema - Patient has been covered for possible CAP with full completed course of antibiotics. As the patient is not febrile and has a stable WBC, we will hold off on further antibiotics for now.  - Continue aspirin  daily.   #Pyuria. UA  remarkable for protein, nitrites, and leukocyte, and many bacteria. 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 Patient is currently asymptomatic. - f/u urine culture  #Anemia. Normocytic, elevated ferritin, low iron. Likely secondary to CKD, but possibly iron deficiency playing a role. - PO Fe - Colonoscopy recommendation if not already done at 50.  - Consider transfusion for HgB <7.0, will need to discuss with nephrology risk vs benefits of extra volume in already volume overloaded patient  #Poorly controlled HTN  - Continue norvasc, metoprolol  BID  - Continue to hold lisinopril in setting of AoCKD.   #DM. On 20U lantus at home.  - Continue lantus but decrease to 15 units daily (from 20) in setting of worsened renal function, and added sensitive SSI.  - DM educator consulted.   #RLE Wound- Well healing approximately 3cm irregular wound on RLE -bacitracin ointment bid  #HLD  - Continue lipitor  daily.   #Chronic Cough -Continue home flonase, PPI -Consider switching from ACEi to ARB   FEN/GI: SLIV, Heart Healthy/Carb Modified diet  Prophylaxis: SCDs, no chemoprophylaxis in setting of anemia  Disposition: Admitted to inpatient status pending above evaluation.   Subjective:  NAEON. Did not sleep well last night. Orthopnea improving. Lying in bed this morning.   Objective: Temp:  [98.4 F (36.9 C)-98.7 F (37.1 C)] 98.6 F (37 C) (09/18 0536) Pulse Rate:  [69-83] 83 (09/18 0536) Resp:  [18-20] 18 (09/18 0536) BP: (133-144)/(60-65) 133/65 mmHg (09/18 0536) SpO2:  [97 %-99 %] 97 % (09/18 0536) Weight:  [209 lb 10.5 oz (95.1 kg)] 209 lb 10.5  oz (95.1 kg) (09/18 0536) Physical Exam: General: NAD, lying reclined in hospital bed Cardiovascular: RRR, no murmurs appreciated Respiratory: Minimally increased work of breathing. Poor inspiratory effort. CTAB with faint crackles noted at  bases Abdomen: Soft, nontender, nondistended Extremities: Right BKA, Left leg with 2+ edema to knee, 3-4cm well healing wound in RLE Skin: Warm, dry Neuro: Awake, alert  Laboratory:  Recent Labs Lab 10/25/13 0530 10/25/13 1350 10/26/13 0747  WBC 4.4 4.3 5.0  HGB 6.9* 7.8* 8.2*  HCT 20.8* 23.0* 24.4*  PLT 149* 158 177    Recent Labs Lab 10/23/13 1230 10/24/13 0535 10/25/13 0530 10/26/13 0352  NA 144 143 139 141  K 4.6 4.7 4.6 4.1  CL 111 110 107 104  CO2 19 17* 18* 22  BUN 47* 46* 48* 49*  CREATININE 2.67* 3.00* 2.85* 3.27*  CALCIUM 9.2 8.9 8.7 8.7  PROT 6.8  --   --   --   BILITOT <0.2*  --   --   --   ALKPHOS 151*  --   --   --   ALT 13  --   --   --   AST 12  --   --   --   GLUCOSE 97 186* 168* 104*    Imaging/Diagnostic Tests:  CXR (9/18) IMPRESSION:  Grossly unchanged findings of pulmonary edema, small bilateral  effusions and associated bibasilar opacities, left greater than  right, atelectasis versus infiltrate. A follow-up chest radiograph  in 4 to 6 weeks after treatment is recommended to ensure resolution.    Jacquiline Doe, MD 10/26/2013, 9:29 AM PGY-1, [facility] FPTS Intern pager: [phone], text pages welcome

## 2013-10-26 NOTE — Progress Notes (Signed)
Subjective:  Patient seen at bedside this AM, says he is feeling much better today stating that his breathing is significantly improved. Still did not sleep well on account of the uncomfortable bed.  Repeat Hb 7.8 yesterday afternoon.  Output of 4L over 24 hours Cr increased to 3.27 this AM.   Medications:  Infusions:   Scheduled Medications: . amLODipine  10 mg Oral Daily  . aspirin EC  81 mg Oral QHS  . atorvastatin  40 mg Oral Daily  . brimonidine  1 drop Left Eye BID  . brinzolamide  1 drop Left Eye BID  . ferrous sulfate  325 mg Oral BID WC  . fluticasone  2 spray Each Nare Daily  . furosemide  80 mg Intravenous BID  . insulin aspart  0-9 Units Subcutaneous TID WC  . insulin glargine  15 Units Subcutaneous QHS  . levofloxacin  750 mg Oral Q48H  . metoprolol tartrate  25 mg Oral BID  . pantoprazole  80 mg Oral Daily  . sodium chloride  3 mL Intravenous Q12H   PRN Meds:.diphenhydrAMINE, oxybutynin, senna  BP 133/65  Pulse 83  Temp(Src) 98.6 F (37 C) (Oral)  Resp 18  Ht  (1.676 m)  Wt 209 lb 10.5 oz (95.1 kg)  BMI 33.86 kg/m2  SpO2 97%   Intake/Output Summary (Last 24 hours) at 10/26/13 0718 Last data filed at 10/26/13 0500  Gross per 24 hour  Intake    720 ml  Output   4050 ml  Net  -3330 ml    Weight change: -1 lb 8.7 oz (-0.7 kg)  10/26/13 0536 209 lb 10.5 oz (95.1 kg) 10/25/13 0653 211 lb 3.2 oz (95.8 kg) 10/24/13 0419 214 lb 15.2 oz (97.5 kg) 10/23/13 1145 223 lb (101.152 kg)      EXAM:  General: Morbidly obese male, alert, cooperative, NAD. HEENT: PERRL, EOMI. Moist mucus membranes.  Neck: Full range of motion without pain, supple, no lymphadenopathy or carotid bruits Lungs: Poor inspiratory effort. Air entry equal bilaterally, decreased breath sounds in bases, otherwise clear.  Heart: RRR, no murmurs, gallops, or rubs Abdomen: Soft, non-tender, non-distended, BS +. Suprapubic catheter tube present.  Extremities: Right leg BKA w/  prosthesis. LLE still w/ +3 pitting edema (also w/ non-pitting component) extending to thigh.  Neurologic: Alert & oriented X3, cranial nerves II-XII intact, strength grossly intact, sensation intact to light touch   Labs: Basic Metabolic Panel:  Recent Labs Lab 10/23/13 1230 10/24/13 0535 10/25/13 0530 10/26/13 0352  NA 144 143 139 141  K 4.6 4.7 4.6 4.1  CL 111 110 107 104  CO2 19 17* 18* 22  GLUCOSE 97 186* 168* 104*  BUN 47* 46* 48* 49*  CREATININE 2.67* 3.00* 2.85* 3.27*  CALCIUM 9.2 8.9 8.7 8.7  MG 2.1  --   --   --   PHOS 4.7*  --  4.4 4.6    Liver Function Tests:  Recent Labs Lab 10/19/13 0916 10/23/13 1230 10/25/13 0530 10/26/13 0352  AST 12 12  --   --   ALT 18 13  --   --   ALKPHOS 138* 151*  --   --   BILITOT 0.3 <0.2*  --   --   PROT 5.6* 6.8  --   --   ALBUMIN 3.0* 2.7* 2.2* 2.3*   CBC:  Recent Labs Lab 10/19/13 0916 10/23/13 1230 10/24/13 0535 10/25/13 0530 10/25/13 1350  WBC 6.2 5.0 4.3 4.4 4.3  NEUTROABS  --  3.6  --   --   --   HGB 8.1* 8.5* 8.3* 6.9* 7.8*  HCT 24.1* 25.4* 24.7* 20.8* 23.0*  MCV 80.9 80.4 80.5 79.7 80.4  PLT 127* 156 158 149* 158    Cardiac Enzymes:  Recent Labs Lab 10/23/13 1149 10/23/13 1811  TROPONINI <0.30 <0.30    CBG:  Recent Labs Lab 10/25/13 0628 10/25/13 1100 10/25/13 1602 10/25/13 2128 10/26/13 0649  GLUCAP 145* 132* 98 107* 107*    Assessment/Plan: 53 y.o. male w/ PMHx of HTN, HLD, DM type II, s/p right BKA, chronic suprapubic catheter, and CKD stage 3, admitted w/ worsening SOB and acute on chronic renal failure w/ nephrotic range proteinuria.   Acute on Chronic CKD- CKD stage 3 associated w/ poorly controlled HTN and DM type II. Baseline appears to be ~1.2, however, it has increased more to the 2.0 range recently (2015). Renal US suggestive of medical renal disease, w/ no hydronephrosis. Additionally UA w/ >300 protein and urine protein creatinine ratio 8.06 c/w nephrotic range  proteinuria. Most likely proteinuria is 2/2 diabetes, however, some concern for another etiology given relatively acute onset of symptoms. 4L urine output in 24 hours, w/ adequate diuresis, however, Cr increased to 3.27 (GFR decreased from 28 to 23). Decrease Lasix to 80 BID orally and stop IV lasix  SOB/cough- Felt that this was largely contributed to by possible underlying new onset CHF in combination w/ preceding respiratory illness and nephrotic syndrome. ECHO from 9/16 shows EF of 50% w/ grade 2 diastolic dysfunction. Repeat CXR today w/ minimal change, still suggestive of pleural effusions. Lasix as above. History of urinary retention w/ suprapubic catheter- Patient claims he has been passing urine normally recently, saw Dr. Brunilda Payor who plugged suprapubic for now. Patient claims he is having adequate urine output. [place] does not suggest hydronephrosis. UA suggestive of infection on admission, however, asymptomatic, likely colonized given suprapubic catheter. Continue to monitor I/O's, daily weights.  Secondary Hyperparathyroidism- Previous PTH in 05/2012 was 322. Started on Zemplar by Dr. Briant Cedar in 06/2012, but it is unclear if patient has been taking this (not on medication list either). Noted to have elevated phos of 4.7 on admission.  Repeat PTH 243 on 9/16. Add po calcitriol. Anemia- Thought to be 2/2 iron deficiency in combination w/ CKD, started on Ferrous sulfate 325 bid yesterday. Hb 6.9 on 9/17 Am, but increased to 7.8 yesterday afternoon w.out any intervention. Denies dark stools or hematochezia. FOBT still pending. Continue Iron supplementation. HTN- BP well controlled. Currently on Norvasc 10 mg qd + Lopressor 25 mg bid. Continue strict BP control.  DM type II- Most recent HbA1c 7.5. Continue current insulin regimen.    Signed: Lars Masson, MD 10/26/2013 7:18 AM I have seen and examined this patient and agree with plan as outlined in the note above.  Change from IV-po lasix. Continue to  watch renal fx.  I have not totally ruled out the need for a diagnostic renal biopsy given the trajectory of loss of kidney function in 2015. Dalylah Ramey B,MD 10/26/2013 11:09 AM

## 2013-10-26 NOTE — Evaluation (Signed)
Physical Therapy Evaluation Patient Details Name: Lawrence [name] MRN: [id number] DOB: 1960/09/26 Today's Date: 10/26/2013   History of Present Illness  Lawrence [name] is a 53 y.o. male presenting with worsened dyspnea and ARF. PMH is significant for chronic cough, CKD, poorly controlled HTN and DM, HLD, neuropathy, acute frontal sinusitis in August, and urinary retention.  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  Pt lives alone and takes SCAT if he has to go out  Clinical Impression  Pt had to increase the padding under his prosthesis to 3 ply due to loss of fluid.  Pt did well walking with RW and prosthesis and only needed min guard for safety for 120 and 80 feet.  Pt has had full extent of therapy for his BKA and has no history of falls. He should do well going home and I encouraged him walk with help at hospital to build up his endurance before gong home.    Follow Up Recommendations No PT follow up    Equipment Recommendations  None recommended by PT    Recommendations for Other Services       Precautions / Restrictions Precautions Precautions: Fall Required Braces or Orthoses:  (right prosthesis) Restrictions Weight Bearing Restrictions: No      Mobility  Bed Mobility Overal bed mobility: Independent                Transfers Overall transfer level: Modified independent   Transfers: Stand Pivot Transfers   Stand pivot transfers: Supervision          Ambulation/Gait Ambulation/Gait assistance: Min guard Ambulation Distance (Feet):  (120 feet and 80 feet) Assistive device: Rolling walker (2 wheeled) Gait Pattern/deviations: Step-through pattern     General Gait Details: Pt has been duiresed and lost a lot of fluid since being in hospital. he usually just wears sleeve under prosthesis but we worked about and found that 3 ply works best for him today.  pt steady with his walking.  it has been a week since he walked.  Pt said  his leg startd feeling heavy at 120 feet so sat and rested before finishing walk.  he said walk felt good to him  Information systems manager [employer] (Stroke Patients Only)       Balance                                             Pertinent Vitals/Pain Pain Assessment: No/denies pain    Home Living Family/patient expects to be discharged to:: Private residence Living Arrangements: Non-relatives/Friends   Type of Home: House Home Access: Level entry     Home Layout: One level Home Equipment: Environmental consultant - 2 wheels;Wheelchair - manual Additional Comments: Pt said he lives alone and  has ramp.  pt spends half of day in wheelchair and half of day walking with RW.  Pt denies history of falls    Prior Function Level of Independence: Independent               Hand Dominance        Extremity/Trunk Assessment               Lower Extremity Assessment: Generalized weakness      Cervical / Trunk  Assessment: Normal  Communication   Communication: No difficulties  Cognition Arousal/Alertness: Awake/alert Behavior During Therapy: WFL for tasks assessed/performed Overall Cognitive Status: Within Functional Limits for tasks assessed                      General Comments General comments (skin integrity, edema, etc.): reveiwed with pt to monitor his skin - he has no way to do this since he cant see well.  I encouraged him to have sister check foot and residual imb when she visits to help monitor for any red spots    Exercises        Assessment/Plan    PT Assessment Patient needs continued PT services  PT Diagnosis Generalized weakness   PT Problem List Decreased strength;Decreased activity tolerance;Decreased mobility  PT Treatment Interventions Gait training;Therapeutic activities;Therapeutic exercise;Functional mobility training   PT Goals (Current goals can be found in the Care Plan section) Acute Rehab PT  Goals Patient Stated Goal: to get energy back and get home safely PT Goal Formulation: With patient Time For Goal Achievement: 11/09/13 Potential to Achieve Goals: Good    Frequency Min 3X/week   Barriers to discharge   pt has a steep driveway so unable to go outside by himself - this has worked OK for him for last year    Co-evaluation               End of Session Equipment Utilized During Treatment: Gait belt Activity Tolerance: Patient tolerated treatment well Patient left: in bed Nurse Communication: Mobility status         Time: 1610-9604 PT Time Calculation (min): 30 min   Charges:   PT Evaluation $Initial PT Evaluation Tier I: 1 Procedure PT Treatments $Gait Training: 8-22 mins   PT G Codes:          Lawrence [name] 10/26/2013, 2:41 PM  10/26/2013   Lawrence [name], PT

## 2013-10-26 NOTE — Progress Notes (Signed)
FMTS ATTENDING  NOTE Lawrence Eniola,MD I  have seen and examined this patient, reviewed their chart. I have discussed this patient with the resident. I agree with the resident's findings, assessment and care plan. 

## 2013-10-27 LAB — URINE CULTURE: Colony Count: >=100000

## 2013-10-27 LAB — HEPATITIS B SURFACE ANTIGEN: Hepatitis B Surface Ag: NEGATIVE

## 2013-10-27 LAB — RENAL FUNCTION PANEL
ALBUMIN: 2.6 g/dL — AB (ref 3.5–5.2)
ANION GAP: 14 (ref 5–15)
BUN: 53 mg/dL — ABNORMAL HIGH (ref 6–23)
CALCIUM: 8.9 mg/dL (ref 8.4–10.5)
CO2: 22 mEq/L (ref 19–32)
Chloride: 103 mEq/L (ref 96–112)
Creatinine, Ser: 3.4 mg/dL — ABNORMAL HIGH (ref 0.50–1.35)
GFR calc non Af Amer: 19 mL/min — ABNORMAL LOW (ref >90)
GFR, EST AFRICAN AMERICAN: 22 mL/min — AB (ref >90)
Glucose, Bld: 142 mg/dL — ABNORMAL HIGH (ref 70–99)
PHOSPHORUS: 5 mg/dL — AB (ref 2.3–4.6)
POTASSIUM: 4.3 meq/L (ref 3.7–5.3)
SODIUM: 139 meq/L (ref 137–147)

## 2013-10-27 LAB — CBC
HEMATOCRIT: 22.9 % — AB (ref 39.0–52.0)
Hemoglobin: 7.7 g/dL — ABNORMAL LOW (ref 13.0–17.0)
MCH: 27 pg (ref 26.0–34.0)
MCHC: 33.6 g/dL (ref 30.0–36.0)
MCV: 80.4 fL (ref 78.0–100.0)
Platelets: 170 10*3/uL (ref 150–400)
RBC: 2.85 MIL/uL — ABNORMAL LOW (ref 4.22–5.81)
RDW: 14.3 % (ref 11.5–15.5)
WBC: 6.4 10*3/uL (ref 4.0–10.5)

## 2013-10-27 LAB — GLUCOSE, CAPILLARY
GLUCOSE-CAPILLARY: 144 mg/dL — AB (ref 70–99)
GLUCOSE-CAPILLARY: 151 mg/dL — AB (ref 70–99)
Glucose-Capillary: 159 mg/dL — ABNORMAL HIGH (ref 70–99)
Glucose-Capillary: 135 mg/dL — ABNORMAL HIGH (ref 70–99)

## 2013-10-27 LAB — HEPATITIS C ANTIBODY: HCV Ab: NEGATIVE

## 2013-10-27 MED ORDER — FUROSEMIDE 40 MG PO TABS
40.0000 mg | ORAL_TABLET | Freq: Two times a day (BID) | ORAL | Status: DC
  Administered 2013-10-27 – 2013-11-02 (×11): 40 mg via ORAL
  Filled 2013-10-27 (×14): qty 1

## 2013-10-27 NOTE — Progress Notes (Signed)
FMTS ATTENDING NOTE Shekina Cordell,MD I  have seen and examined this patient, reviewed their chart. I have discussed this patient with the resident. I agree with the resident's findings, assessment and care plan.Patient doing well in general, denies SOB, his O2 sat holding up well. He has been afebrile, urine culture + gram neg rod, sensitivity pending, plan to treat based on sensitivity report. Continue diuresis and monitor kidney function.

## 2013-10-27 NOTE — Progress Notes (Signed)
Subjective:  Patient seen at bedside this AM, feeling well this morning, breathing improved, leg swelling significantly better, slept well overnight.  Hb stable. Output of 1.1L over 24 hours Cr increased to 3.40 this AM.   Medications:  Infusions:   Scheduled Medications: . amLODipine  10 mg Oral Daily  . aspirin EC  81 mg Oral QHS  . atorvastatin  40 mg Oral Daily  . bacitracin   Topical BID  . brimonidine  1 drop Left Eye BID  . brinzolamide  1 drop Left Eye BID  . calcitRIOL  0.25 mcg Oral Daily  . ferrous sulfate  325 mg Oral BID WC  . fluticasone  2 spray Each Nare Daily  . furosemide  80 mg Oral BID  . insulin aspart  0-9 Units Subcutaneous TID WC  . insulin glargine  15 Units Subcutaneous QHS  . metoprolol tartrate  25 mg Oral BID  . pantoprazole  80 mg Oral Daily  . sodium chloride  3 mL Intravenous Q12H   PRN Meds:.oxybutynin, senna, traZODone  BP 133/58  Pulse 76  Temp(Src) 98.2 F (36.8 C) (Oral)  Resp 18  Ht  (1.676 m)  Wt 209 lb 10.5 oz (95.1 kg)  BMI 33.86 kg/m2  SpO2 100%   Intake/Output Summary (Last 24 hours) at 10/27/13 0848 Last data filed at 10/27/13 0700  Gross per 24 hour  Intake    360 ml  Output   1150 ml  Net   -790 ml    Weight change:  10/27/13  10/26/13 0536 209 lb 10.5 oz (95.1 kg) 10/25/13 0653 211 lb 3.2 oz (95.8 kg) 10/24/13 0419 214 lb 15.2 oz (97.5 kg) 10/23/13 1145 223 lb (101.152 kg)      EXAM:  General: Morbidly obese male, alert, cooperative, NAD. HEENT: PERRL, EOMI. Moist mucus membranes.  Neck: Full range of motion without pain, supple, no lymphadenopathy or carotid bruits Lungs: Poor inspiratory effort. Air entry equal bilaterally, decreased breath sounds in bases, otherwise clear.  Heart: RRR, no murmurs, gallops, or rubs Abdomen: Soft, non-tender, non-distended, BS +. Suprapubic catheter tube present.  Extremities: Right leg BKA w/ prosthesis. LLE still w/ +2 pitting edema. Significant less tense today.   Neurologic: Alert & oriented X3, cranial nerves II-XII intact, strength grossly intact, sensation intact to light touch   Labs: Basic Metabolic Panel:  Recent Labs Lab 10/23/13 1230  10/25/13 0530 10/26/13 0352 10/27/13 0358  NA 144  < > 139 141 139  K 4.6  < > 4.6 4.1 4.3  CL 111  < > 107 104 103  CO2 19  < > 18* 22 22  GLUCOSE 97  < > 168* 104* 142*  BUN 47*  < > 48* 49* 53*  CREATININE 2.67*  < > 2.85* 3.27* 3.40*  CALCIUM 9.2  < > 8.7 8.7 8.9  MG 2.1  --   --   --   --   PHOS 4.7*  --  4.4 4.6 5.0*  < > = values in this interval not displayed.  Liver Function Tests:  Recent Labs Lab 10/23/13 1230 10/25/13 0530 10/26/13 0352 10/27/13 0358  AST 12  --   --   --   ALT 13  --   --   --   ALKPHOS 151*  --   --   --   BILITOT <0.2*  --   --   --   PROT 6.8  --   --   --  ALBUMIN 2.7* 2.2* 2.3* 2.6*   CBC:  Recent Labs Lab 10/23/13 1230 10/24/13 0535 10/25/13 0530 10/25/13 1350 10/26/13 0747 10/27/13 0358  WBC 5.0 4.3 4.4 4.3 5.0 6.4  NEUTROABS 3.6  --   --   --   --   --   HGB 8.5* 8.3* 6.9* 7.8* 8.2* 7.7*  HCT 25.4* 24.7* 20.8* 23.0* 24.4* 22.9*  MCV 80.4 80.5 79.7 80.4 81.6 80.4  PLT 156 158 149* 158 177 170    Cardiac Enzymes:  Recent Labs Lab 10/23/13 1149 10/23/13 1811  TROPONINI <0.30 <0.30    CBG:  Recent Labs Lab 10/26/13 0649 10/26/13 1103 10/26/13 1648 10/26/13 2131 10/27/13 0621  GLUCAP 107* 132* 100* 133* 144*    Assessment/Plan: 53 y.o. male w/ PMHx of HTN, HLD, DM type II, s/p right BKA, chronic suprapubic catheter, and CKD stage 3, admitted w/ worsening SOB and acute on chronic renal failure w/ nephrotic range proteinuria.   Acute on Chronic CKD- CKD stage 3 associated w/ poorly controlled HTN and DM type II. Baseline appears to be ~1.2, however, it has increased more to the 2.0 range recently (2015). Renal US suggestive of medical renal disease, w/ no hydronephrosis. Additionally UA w/ >300 protein and urine protein  creatinine ratio 8.06 c/w nephrotic range proteinuria. Most likely proteinuria is 2/2 diabetes, however, still some concern for another etiology given relatively acute onset of symptoms and continued worsening renal function. 1.1L urine output in 24 hours, w/ adequate diuresis, however, Cr now to 3.40. Decreased Lasix to 80 BID orally yesterday. Will decrease Lasix to 40 mg bid. Also ordered complement, SPEP, UPEP, ANCA, ANA, Hep B Ag, Hep C, serum light chains. Possibility patient may need renal biopsy early next week.  SOB/cough- Felt that this was largely contributed to by possible underlying new onset CHF in combination w/ preceding respiratory illness and nephrotic syndrome. ECHO from 9/16 shows EF of 50% w/ grade 2 diastolic dysfunction. Repeat CXR w/ minimal change, still suggestive of pleural effusions. Lasix as above. History of urinary retention w/ suprapubic catheter- Patient claims he has been passing urine normally recently, saw Dr. Brunilda Payor who plugged suprapubic for now. Patient claims he is having adequate urine output. [place] does not suggest hydronephrosis. UA suggestive of infection on admission w/ >100,000 colonies of gram negative rods, however, asymptomatic, likely colonized given suprapubic catheter. Continue to monitor I/O's, daily weights.  Secondary Hyperparathyroidism- Previous PTH in 05/2012 was 322. Started on Zemplar by Dr. Briant Cedar in 06/2012, but it is unclear if patient has been taking this (not on medication list either). Noted to have elevated phos of 4.7 on admission.  Repeat PTH 243 on 9/16. Continue po calcitriol. Anemia- Thought to be 2/2 iron deficiency in combination w/ CKD, started on Ferrous sulfate 325 bid on admission. Hb stable at 7.7. Continue Iron supplementation. HTN- BP well controlled. Currently on Norvasc 10 mg qd + Lopressor 25 mg bid. Continue strict BP control.  DM type II- Most recent HbA1c 7.5. Continue current insulin regimen.    Signed: Lars Masson,  MD 10/27/2013 8:48 AM  I have seen and examined this patient and agree with assessment and recommendations above.  Renal function continues to worsen and diuretics have been reduced - although he still has significant LE edema, pulm wise has improved dramatically so I am OK with this.  I am bothered by the continued worsening of renal function, and also by the trajectory of renal function loss this year which by way of  review is as follows: Creatinine, Ser   Date/Time  Value  Ref Range  Status   10/27/2013 3.40   Diuretics reduced    10/26/2013 3.27   Diuretics changed po    10/25/2013 2.85    10/24/2013 5:35 AM  3.00* diuresis initiated for CHF 0.50 - 1.35 mg/dL  Final   1/61/0960 45:40 PM  2.67*  0.50 - 1.35 mg/dL  Final   9/81/19  1.47 Lisinopril discontinued     10/09/13  2.24     06/08/13  1.62     04/27/13  1.78 Lisinopril restarted     03/02/2013 8:40 AM  1.63*  0.50 - 1.35 mg/dL  Final   82/95/6213 0:86 AM  1.23  0.50 - 1.35 mg/dL  Final    As previously documented I had initially attributed his nephrotic range proteinuria (8 grams) to his DM, given the presence of dipstick proteinuria since 2011but was bothered by the acuity with which he had develop symptomatic edema and volume overload. We are obtaining some additional serologic studies (ANA, ANCA, complements, SPEP, UPEP, serum free light chains, hepatitis werologies) re: his kidney injury/nephrotic proteinuria, and if we do not see plateau or marked improvement in renal function over the weekend I think he should undergo diagnostic percutaneous renal biopsy first of the week.  Have discussed this possibility with the patient and he understands and will agree if necessary.  Avenir Lozinski B,MD 10/27/2013 11:47 AM

## 2013-10-27 NOTE — Progress Notes (Signed)
Family Medicine Teaching Service Daily Progress Note Intern Pager: [phone]  Patient name: Lawrence [name] Medical record number: [id number] Date of birth: 1960/08/21 Age: 53 y.o. Gender: male  Primary Care Provider: [name], MD Consultants: Nephrology Code Status: Full  Pt Overview and Major Events to Date:  9/15 - admitted with dyspnea and ARF 9/16 - Started on IV lasix for diuresis 9/18- switched to PO lasix  Assessment and Plan: Lawrence [name] is a 53 y.o. male presenting with worsened dyspnea and ARF. PMH is significant for chronic cough, CKD, poorly controlled HTN and DM, HLD, neuropathy, and urinary retention.   #AoCKD. Cr 2.78 on admission up from baseline over past 6 months of 1.6-1.7, prior to that 1.2 (11/2012). Labs thus far most consistent with nephrotic syndrome (UPC 8). Most likely diabetic nephropathy. Appears clinically volume overloaded but apparently better since admission. Cr limits aggressive diuresis. Now 3.4 this morning; will defer to renal for recs  - Nephrology consulted, appreciate assistance - Continue HTN and DM management  #Dyspnea likely due to volume overload in the setting of AoCKD.  low suspicion for PE (negative LE dopplers, improvement of dyspnea with diuresis and lack of desats on room air). Completed full course abx for CAP. Sating ok on RA - Echo: 50% EF, grade 2 diastolic dysfunction, though limited study due to inability to position patient - Will re-attempt Echo once orthopnea improves, likely outpt - Continue aspirin  daily.   #Pyuria. UA remarkable for protein, nitrites, and leukocyte, and many bacteria. >100,000 GNR - f/u urine culture  #Anemia. Normocytic, elevated ferritin, low iron. Likely secondary to CKD, but possibly iron deficiency playing a role. 8.2 -> 7.7 (10/27/13) - PO Fe - Colonoscopy recommendation if not already done at 50.  - Consider transfusion for HgB <7.0, will need to discuss with nephrology risk vs benefits of  extra volume in already volume overloaded patient  #Poorly controlled HTN normotensive currently - Continue norvasc, metoprolol  BID  - Continue to hold lisinopril in setting of AoCKD.   #DM. On 20U lantus at home. CBGs 100s-140s - Continue lantus but at decreased 15 units daily (from 20) in setting of worsened renal function - sensitive SSI.  - DM educator consulted.   #RLE Wound- Well healing approximately 3cm irregular wound on RLE -bacitracin ointment bid  #HLD  - Continue lipitor  daily.   #Chronic Cough -Continue home flonase, PPI -Consider switching from ACEi to ARB (currently holding)  FEN/GI: SLIV, Heart Healthy/Carb Modified diet  Prophylaxis: SCDs, no chemoprophylaxis in setting of anemia  Disposition: further diuresis   Subjective: Feels much better this morning; no concerns with breathing  Objective: Temp:  [98.2 F (36.8 C)-98.4 F (36.9 C)] 98.2 F (36.8 C) (09/19 0355) Pulse Rate:  [69-79] 76 (09/19 0355) Resp:  [18] 18 (09/19 0355) BP: (132-149)/(58-64) 133/58 mmHg (09/19 0355) SpO2:  [99 %-100 %] 100 % (09/19 0355) Physical Exam: General: NAD, sitting up in bedside chair Cardiovascular: RRR, no murmurs appreciated Respiratory: nml WOB; CTAB decreased at bibasilar fields Abdomen: Soft, nontender, nondistended Extremities: Right BKA, Left leg with 2+ doughy edema to knee, 3-4cm well healing wound in RLE Skin: Warm, dry Neuro: Awake, alert  Laboratory:  Recent Labs Lab 10/25/13 1350 10/26/13 0747 10/27/13 0358  WBC 4.3 5.0 6.4  HGB 7.8* 8.2* 7.7*  HCT 23.0* 24.4* 22.9*  PLT 158 177 170    Recent Labs Lab 10/23/13 1230  10/25/13 0530 10/26/13 0352 10/27/13 0358  NA 144  < >  139 141 139  K 4.6  < > 4.6 4.1 4.3  CL 111  < > 107 104 103  CO2 19  < > 18* 22 22  BUN 47*  < > 48* 49* 53*  CREATININE 2.67*  < > 2.85* 3.27* 3.40*  CALCIUM 9.2  < > 8.7 8.7 8.9  PROT 6.8  --   --   --   --   BILITOT <0.2*  --   --   --   --    ALKPHOS 151*  --   --   --   --   ALT 13  --   --   --   --   AST 12  --   --   --   --   GLUCOSE 97  < > 168* 104* 142*  < > = values in this interval not displayed.  Imaging/Diagnostic Tests:  CXR (9/18) IMPRESSION:  Grossly unchanged findings of pulmonary edema, small bilateral  effusions and associated bibasilar opacities, left greater than  right, atelectasis versus infiltrate. A follow-up chest radiograph  in 4 to 6 weeks after treatment is recommended to ensure resolution.    Charlane Ferretti, MD 10/27/2013, 8:10 AM PGY-2, [facility] FPTS Intern pager: [phone], text pages welcome

## 2013-10-27 NOTE — Progress Notes (Signed)
Chaplain Note: Responded to request from RN to provide support for patient. Patient upset because he feels he is being told different things by too many different doctors. Acknowledged patient's frustration and provided emotional support. Shared  patient's concerns with RN. Follow up recommended. Rutherford Nail, Chaplain

## 2013-10-27 NOTE — Progress Notes (Signed)
Pt telemetry discontinued as ordered; called in to CCMD. Dionne Bucy RN

## 2013-10-28 DIAGNOSIS — J0110 Acute frontal sinusitis, unspecified: Secondary | ICD-10-CM

## 2013-10-28 LAB — GLUCOSE, CAPILLARY
GLUCOSE-CAPILLARY: 146 mg/dL — AB (ref 70–99)
GLUCOSE-CAPILLARY: 170 mg/dL — AB (ref 70–99)
Glucose-Capillary: 158 mg/dL — ABNORMAL HIGH (ref 70–99)
Glucose-Capillary: 190 mg/dL — ABNORMAL HIGH (ref 70–99)

## 2013-10-28 LAB — CBC
HCT: 20.5 % — ABNORMAL LOW (ref 39.0–52.0)
Hemoglobin: 7 g/dL — ABNORMAL LOW (ref 13.0–17.0)
MCH: 28.1 pg (ref 26.0–34.0)
MCHC: 34.1 g/dL (ref 30.0–36.0)
MCV: 82.3 fL (ref 78.0–100.0)
Platelets: 149 10*3/uL — ABNORMAL LOW (ref 150–400)
RBC: 2.49 MIL/uL — ABNORMAL LOW (ref 4.22–5.81)
RDW: 14.4 % (ref 11.5–15.5)
WBC: 6 10*3/uL (ref 4.0–10.5)

## 2013-10-28 LAB — RENAL FUNCTION PANEL
ALBUMIN: 2.2 g/dL — AB (ref 3.5–5.2)
ANION GAP: 15 (ref 5–15)
BUN: 62 mg/dL — ABNORMAL HIGH (ref 6–23)
CALCIUM: 8.5 mg/dL (ref 8.4–10.5)
CO2: 20 mEq/L (ref 19–32)
Chloride: 103 mEq/L (ref 96–112)
Creatinine, Ser: 4.02 mg/dL — ABNORMAL HIGH (ref 0.50–1.35)
GFR calc non Af Amer: 16 mL/min — ABNORMAL LOW (ref >90)
GFR, EST AFRICAN AMERICAN: 18 mL/min — AB (ref >90)
Glucose, Bld: 189 mg/dL — ABNORMAL HIGH (ref 70–99)
Phosphorus: 5.2 mg/dL — ABNORMAL HIGH (ref 2.3–4.6)
Potassium: 4.1 mEq/L (ref 3.7–5.3)
SODIUM: 138 meq/L (ref 137–147)

## 2013-10-28 LAB — PROTIME-INR
INR: 1.21 (ref 0.00–1.49)
PROTHROMBIN TIME: 15.3 s — AB (ref 11.6–15.2)

## 2013-10-28 LAB — PREPARE RBC (CROSSMATCH)

## 2013-10-28 LAB — ABO/RH: ABO/RH(D): A POS

## 2013-10-28 MED ORDER — ACETAMINOPHEN 325 MG PO TABS
650.0000 mg | ORAL_TABLET | Freq: Four times a day (QID) | ORAL | Status: DC | PRN
  Administered 2013-10-28 – 2013-10-30 (×2): 650 mg via ORAL
  Filled 2013-10-28 (×2): qty 2

## 2013-10-28 MED ORDER — IPRATROPIUM BROMIDE 0.06 % NA SOLN
2.0000 | Freq: Three times a day (TID) | NASAL | Status: DC
  Administered 2013-10-28 – 2013-11-01 (×2): 2 via NASAL
  Filled 2013-10-28: qty 15

## 2013-10-28 MED ORDER — SODIUM CHLORIDE 0.9 % IV SOLN
Freq: Once | INTRAVENOUS | Status: AC
  Administered 2013-10-28: 16:00:00 via INTRAVENOUS

## 2013-10-28 NOTE — Progress Notes (Addendum)
Subjective:  Patient seen at bedside this AM, no new complaints. Denies dizziness, lightheadedness, or SOB.  Urine output 2.1L over past 24 hours, net -9L since admission. Cr continues to increase, now 4.02. Hb 7.0 this AM  Objective: Medications:   Scheduled Medications: . amLODipine  10 mg Oral Daily  . aspirin EC  81 mg Oral QHS  . atorvastatin  40 mg Oral Daily  . bacitracin   Topical BID  . brimonidine  1 drop Left Eye BID  . brinzolamide  1 drop Left Eye BID  . calcitRIOL  0.25 mcg Oral Daily  . ferrous sulfate  325 mg Oral BID WC  . fluticasone  2 spray Each Nare Daily  . furosemide  40 mg Oral BID  . insulin aspart  0-9 Units Subcutaneous TID WC  . insulin glargine  15 Units Subcutaneous QHS  . metoprolol tartrate  25 mg Oral BID  . pantoprazole  80 mg Oral Daily  . sodium chloride  3 mL Intravenous Q12H   PRN Meds:.oxybutynin, senna, traZODone  BP 120/48  Pulse 74  Temp(Src) 98.5 F (36.9 C) (Oral)  Resp 18  Ht  (1.676 m)  Wt 209 lb 10.5 oz (95.1 kg)  BMI 33.86 kg/m2  SpO2 98%   Intake/Output Summary (Last 24 hours) at 10/28/13 0737 Last data filed at 10/28/13 0503  Gross per 24 hour  Intake    480 ml  Output   2175 ml  Net  -1695 ml    Weight change:  10/28/13 REFUSED 10/26/13 0536 209 lb 10.5 oz (95.1 kg) 10/25/13 0653 211 lb 3.2 oz (95.8 kg) 10/24/13 0419 214 lb 15.2 oz (97.5 kg) 10/23/13 1145 223 lb (101.152 kg)      EXAM:  General: Morbidly obese male, alert, cooperative, NAD. HEENT: PERRL, EOMI. Moist mucus membranes.  Neck: Full range of motion without pain, supple, no lymphadenopathy or carotid bruits Lungs: Poor inspiratory effort. Air entry equal bilaterally, decreased breath sounds in bases, otherwise clear.  Heart: RRR, no murmurs, gallops, or rubs Abdomen: Soft, non-tender, non-distended, BS +. Suprapubic catheter tube present.  Extremities: Right leg BKA w/ prosthesis. LLE still w/ +2 pitting edema. Still w/ mild improvement  today. Neurologic: Alert & oriented X3, cranial nerves II-XII intact, strength grossly intact, sensation intact to light touch   Labs: Basic Metabolic Panel:  Recent Labs Lab 10/23/13 1230  10/26/13 0352 10/27/13 0358 10/28/13 0437  NA 144  < > 141 139 138  K 4.6  < > 4.1 4.3 4.1  CL 111  < > 104 103 103  CO2 19  < > GLUCOSE 97  < > 104* 142* 189*  BUN 47*  < > 49* 53* 62*  CREATININE 2.67*  < > 3.27* 3.40* 4.02*  CALCIUM 9.2  < > 8.7 8.9 8.5  MG 2.1  --   --   --   --   PHOS 4.7*  < > 4.6 5.0* 5.2*  < > = values in this interval not displayed.  Creatinine, Ser   Date/Time  Value  Ref Range  Status   10/28/2013 4.02 0.50 - 1.35 mg/dL Final   1/61/0960 4.54   Diuretics reduced 0.50 - 1.35 mg/dL Final   0/98/1191 4.78   Diuretics changed po 0.50 - 1.35 mg/dL Final   2/95/6213 0.86 0.50 - 1.35 mg/dL Final   5/78/4696 2:95 AM  3.00*  diuresis initiated for CHF 0.50 - 1.35 mg/dL  Final  10/23/2013 12:30 PM  2.67*  0.50 - 1.35 mg/dL  Final   07/12/52  0.98  Lisinopril discontinued  0.50 - 1.35 mg/dL Final   02/08/89  4.78  2.95 - 1.35 mg/dL Final   07/11/11  0.86  5.78 - 1.35 mg/dL Final   4/69/62  9.52 Lisinopril restarted  0.50 - 1.35 mg/dL Final   8/41/3244 0:10 AM  1.63*  0.50 - 1.35 mg/dL  Final   27/25/3664 4:03 AM  1.23  0.50 - 1.35 mg/dL  Final    Liver Function Tests:  Recent Labs Lab 10/23/13 1230  10/26/13 0352 10/27/13 0358 10/28/13 0437  AST 12  --   --   --   --   ALT 13  --   --   --   --   ALKPHOS 151*  --   --   --   --   BILITOT <0.2*  --   --   --   --   PROT 6.8  --   --   --   --   ALBUMIN 2.7*  < > 2.3* 2.6* 2.2*  < > = values in this interval not displayed. CBC:  Recent Labs Lab 10/23/13 1230  10/25/13 0530 10/25/13 1350 10/26/13 0747 10/27/13 0358 10/28/13 0437  WBC 5.0  < > 4.4 4.3 5.0 6.4 6.0  NEUTROABS 3.6  --   --   --   --   --   --   HGB 8.5*  < > 6.9* 7.8* 8.2* 7.7* 7.0*  HCT 25.4*  < > 20.8* 23.0* 24.4* 22.9* 20.5*   MCV 80.4  < > 79.7 80.4 81.6 80.4 82.3  PLT 156  < > 149* 158 177 170 149*  < > = values in this interval not displayed.  Cardiac Enzymes:  Recent Labs Lab 10/23/13 1149 10/23/13 1811  TROPONINI <0.30 <0.30    CBG:  Recent Labs Lab 10/27/13 0621 10/27/13 1200 10/27/13 1620 10/27/13 2129 10/28/13 0651  GLUCAP 144* 159* 151* 135* 146*    Assessment/Plan: 53 y.o. male w/ PMHx of HTN, HLD, DM type II, s/p right BKA, chronic suprapubic catheter, and CKD stage 3, admitted w/ worsening SOB and acute on chronic renal failure w/ nephrotic range proteinuria.   Acute on Chronic CKD- CKD stage 3 associated w/ poorly controlled HTN and DM type II. Baseline appears to be ~1.2, however, this seems to have steadily increased over the past year. Renal US suggestive of medical renal disease, w/ no hydronephrosis. Additionally urine protein creatinine ratio 8.06 c/w nephrotic range proteinuria. Most likely proteinuria is 2/2 diabetes, however, significant concern for another etiology given acute onset of symptoms and continued worsening renal function. 2.1L urine output in 24 hours, w/ adequate diuresis, however, Cr still increasing, now 4.02. On Lasix 40 mg bid currently. Complement, SPEP, UPEP, ANCA, ANA, Hep B Ag, Hep C, serum light chains pending. Given continued increase in Cr despite decreased diuresis, will schedule renal biopsy for this week, timing as per IR. Will check PT/INR. Cough- Felt that this was largely contributed to by possible underlying new onset CHF in combination w/ preceding respiratory illness and nephrotic syndrome. ECHO from 9/16 shows EF of 50% w/ grade 2 diastolic dysfunction. Repeat CXR w/ minimal change, still suggestive of pleural effusions. Lasix as above. History of urinary retention w/ suprapubic catheter- Currently urinating normally, suprapubic catheter plugged. No dysuria or other urinary symptoms. Urine Culture from admission w/ >100,000 colonies serratia  fonticola. Received 1 dose Levaquin.  Secondary Hyperparathyroidism- Previous PTH in 05/2012 was 322. Started on Zemplar by Dr. Briant Cedar in 06/2012, but it is unclear if patient has been taking this (not on medication list either). Noted to have elevated phos of 4.7 on admission.  Repeat PTH 243 on 9/16. Continue po calcitriol. Anemia- Thought to be 2/2 iron deficiency in combination w/ CKD, started on Ferrous sulfate 325 bid on admission. Hb decreased to 7.0 this AM, currently asymptomatic. Given upcoming renal biopsy, will give 1 unit PRBC's today. Continue iron supplementation. HTN- BP well controlled. Currently on Norvasc 10 mg qd + Lopressor 25 mg bid. Continue strict BP control.  DM type II- Most recent HbA1c 7.5. Continue current insulin regimen.    Signed: Lars Masson, MD 10/28/2013 7:37 AM  I have seen and examined this patient and agree with above note, assessment and plan . Multiple studies are still pending, including ANA, complements, ANCA, light chains, SPEP, UPEP.  Because of fairly rapid recent trajectory of loss of renal function, acuity of development of edema with recognition of nephrotic range proteinuria (despite dipstick proteinuria for around 4 years at least in the setting of DM), and continued acute worsening of renal function in the hospital disproportionate to any specific renal insult, volume or BP shift or medication, I feel renal biopsy indicated.  Will try to arrange through IR for Monday, more likely Tuesday. With Hb of 7, very little cushion in event of bleeding complication so recommend transfusion of 1 unit of blood. Stop aspirin.  Have discussed with patient who is in agreement.   Gage Treiber B,MD 10/28/2013 11:54 AM

## 2013-10-28 NOTE — Progress Notes (Signed)
Family Medicine Teaching Service Daily Progress Note Intern Pager: [phone]  Patient name: Lawrence [name] Medical record number: [id number] Date of birth: May 11, 1960 Age: 53 y.o. Gender: male  Primary Care Provider: [name], MD Consultants: Nephrology Code Status: Full  Pt Overview and Major Events to Date:  9/15 - admitted with dyspnea and ARF 9/16 - Started on IV lasix for diuresis 9/18- switched to PO lasix  Assessment and Plan: Lawrence [name] is a 53 y.o. male presenting with worsened dyspnea and ARF. PMH is significant for chronic cough, CKD, poorly controlled HTN and DM, HLD, neuropathy, and urinary retention.   #AoCKD. Cr 2.78 on admission up from baseline over past 6 months of 1.6-1.7, prior to that 1.2 (11/2012). Labs thus far most consistent with nephrotic syndrome (UPC 8). Most likely diabetic nephropathy. Appears clinically volume overloaded but down 8L from admission. Cr limits aggressive diuresis. Now 4 this morning; will defer to renal for recs  - Nephrology consulted, appreciate assistance - Continue HTN and DM management - f/u complement, SPEP, UPEP, ANCA, ANA, serum light chains - hep B and C neg - renal scheduling biopsy this week given cr continuing to rise  #Dyspnea likely due to volume overload in the setting of AoCKD.  low suspicion for PE (negative LE dopplers, improvement of dyspnea with diuresis and lack of desats on room air). Completed full course abx for CAP. Sating ok on RA - Echo: 50% EF, grade 2 diastolic dysfunction, though limited study due to inability to position patient - Will re-attempt Echo once orthopnea improves, likely outpt - Continue aspirin  daily.   #Pyuria. UA remarkable for protein, nitrites, and leukocyte, and many bacteria. >100,000 GNR - urine culture >100,000 colonies serratia fonticola  #Anemia. Normocytic, elevated ferritin, low iron. Likely secondary to CKD, but possibly iron deficiency playing a role. 8.2 -> 7.7>7  (9/20) - PO Fe - Colonoscopy recommendation if not already done at 50.  - Consider transfusion for HgB <7.0, will need to discuss with nephrology risk vs benefits of extra volume in already volume overloaded patient  #Poorly controlled HTN normotensive currently - Continue norvasc, metoprolol  BID  - Continue to hold lisinopril in setting of AoCKD.   #DM. On 20U lantus at home. CBGs 100s-180s - Continue lantus but at decreased 15 units daily in setting of worsened renal function - sensitive SSI.  - DM educator consulted.   #RLE Wound- Well healing approximately 3cm irregular wound on RLE -bacitracin ointment bid  #HLD  - Continue lipitor  daily.   #Chronic Cough, ear pressure -Continue home flonase, PPI -Consider switching from ACEi to ARB (currently holding) -Add atrovent nasal spray for likely eustachian tube disfunction  FEN/GI: SLIV, Heart Healthy/Carb Modified diet  Prophylaxis: SCDs, no chemoprophylaxis in setting of anemia  Disposition: further diuresis   Subjective: Feels much better this morning; has been moving around the unit to get his strength back, complaining that his ears feel clogged and he's having trouble hearing  Objective: Temp:  [98.3 F (36.8 C)-98.8 F (37.1 C)] 98.5 F (36.9 C) (09/20 0652) Pulse Rate:  [71-83] 74 (09/20 0652) Resp:  [17-18] 18 (09/20 0652) BP: (120-138)/(48-64) 120/48 mmHg (09/20 0652) SpO2:  [98 %-100 %] 98 % (09/20 1308) Physical Exam: General: NAD, sitting up in bedside chair Cardiovascular: RRR, no murmurs appreciated Respiratory: nml WOB; CTAB decreased at bibasilar fields Abdomen: Soft, nontender, nondistended Extremities: Right BKA, Left leg with 1+ edema to knee, 3-4cm well healing wound in RLE Skin: Warm,  dry Neuro: Awake, alert  Laboratory:  Recent Labs Lab 10/26/13 0747 10/27/13 0358 10/28/13 0437  WBC 5.0 6.4 6.0  HGB 8.2* 7.7* 7.0*  HCT 24.4* 22.9* 20.5*  PLT 177 170 149*    Recent Labs Lab  10/23/13 1230  10/26/13 0352 10/27/13 0358 10/28/13 0437  NA 144  < > 141 139 138  K 4.6  < > 4.1 4.3 4.1  CL 111  < > 104 103 103  CO2 19  < > BUN 47*  < > 49* 53* 62*  CREATININE 2.67*  < > 3.27* 3.40* 4.02*  CALCIUM 9.2  < > 8.7 8.9 8.5  PROT 6.8  --   --   --   --   BILITOT <0.2*  --   --   --   --   ALKPHOS 151*  --   --   --   --   ALT 13  --   --   --   --   AST 12  --   --   --   --   GLUCOSE 97  < > 104* 142* 189*  < > = values in this interval not displayed.  Imaging/Diagnostic Tests: CXR (9/18)  Grossly unchanged findings of pulmonary edema, small bilateral effusions and associated bibasilar opacities, left greater than right, atelectasis versus infiltrate. A follow-up chest radiograph in 4 to 6 weeks after treatment is recommended to ensure resolution.   Abram Sander, MD 10/28/2013, 8:14 AM PGY-2, [facility] FPTS Intern pager: [phone], text pages welcome

## 2013-10-28 NOTE — Progress Notes (Addendum)
FMTS ATTENDING NOTE [name],MD I  have seen and examined this patient, reviewed their chart. I have discussed this patient with the resident. I agree with the resident's findings, assessment and care plan. Patient doing well with no concern today, urine culture positive with sensitivity to Clindamycin,he already had doses of Levaquin. Currently asymptomatic hence will not treat further.

## 2013-10-29 DIAGNOSIS — G589 Mononeuropathy, unspecified: Secondary | ICD-10-CM

## 2013-10-29 DIAGNOSIS — L02419 Cutaneous abscess of limb, unspecified: Secondary | ICD-10-CM

## 2013-10-29 DIAGNOSIS — E7800 Pure hypercholesterolemia, unspecified: Secondary | ICD-10-CM

## 2013-10-29 DIAGNOSIS — L03119 Cellulitis of unspecified part of limb: Secondary | ICD-10-CM

## 2013-10-29 DIAGNOSIS — H9319 Tinnitus, unspecified ear: Secondary | ICD-10-CM

## 2013-10-29 LAB — GLUCOSE, CAPILLARY
GLUCOSE-CAPILLARY: 157 mg/dL — AB (ref 70–99)
Glucose-Capillary: 174 mg/dL — ABNORMAL HIGH (ref 70–99)
Glucose-Capillary: 157 mg/dL — ABNORMAL HIGH (ref 70–99)
Glucose-Capillary: 200 mg/dL — ABNORMAL HIGH (ref 70–99)

## 2013-10-29 LAB — TYPE AND SCREEN
ABO/RH(D): A POS
Antibody Screen: NEGATIVE
UNIT DIVISION: 0

## 2013-10-29 LAB — CBC
HCT: 23.1 % — ABNORMAL LOW (ref 39.0–52.0)
HEMOGLOBIN: 8 g/dL — AB (ref 13.0–17.0)
MCH: 27.9 pg (ref 26.0–34.0)
MCHC: 34.6 g/dL (ref 30.0–36.0)
MCV: 80.5 fL (ref 78.0–100.0)
Platelets: 148 10*3/uL — ABNORMAL LOW (ref 150–400)
RBC: 2.87 MIL/uL — AB (ref 4.22–5.81)
RDW: 14.3 % (ref 11.5–15.5)
WBC: 5.9 10*3/uL (ref 4.0–10.5)

## 2013-10-29 LAB — RENAL FUNCTION PANEL
Albumin: 2.3 g/dL — ABNORMAL LOW (ref 3.5–5.2)
Anion gap: 12 (ref 5–15)
BUN: 65 mg/dL — AB (ref 6–23)
CALCIUM: 8.6 mg/dL (ref 8.4–10.5)
CO2: 22 mEq/L (ref 19–32)
Chloride: 104 mEq/L (ref 96–112)
Creatinine, Ser: 4 mg/dL — ABNORMAL HIGH (ref 0.50–1.35)
GFR calc Af Amer: 18 mL/min — ABNORMAL LOW (ref >90)
GFR calc non Af Amer: 16 mL/min — ABNORMAL LOW (ref >90)
GLUCOSE: 183 mg/dL — AB (ref 70–99)
Phosphorus: 5.6 mg/dL — ABNORMAL HIGH (ref 2.3–4.6)
Potassium: 4.3 mEq/L (ref 3.7–5.3)
Sodium: 138 mEq/L (ref 137–147)

## 2013-10-29 LAB — ANCA SCREEN W REFLEX TITER
ATYPICAL P-ANCA SCREEN: NEGATIVE
C-ANCA SCREEN: NEGATIVE
P-ANCA SCREEN: NEGATIVE

## 2013-10-29 LAB — MPO/PR-3 (ANCA) ANTIBODIES: Myeloperoxidase Abs: <1

## 2013-10-29 LAB — KAPPA/LAMBDA LIGHT CHAINS
Kappa free light chain: 20.1 mg/dL — ABNORMAL HIGH (ref 0.33–1.94)
Kappa, lambda light chain ratio: 1.86 — ABNORMAL HIGH (ref 0.26–1.65)
Lambda free light chains: 10.8 mg/dL — ABNORMAL HIGH (ref 0.57–2.63)

## 2013-10-29 LAB — ANA: Anti Nuclear Antibody(ANA): NEGATIVE

## 2013-10-29 NOTE — Progress Notes (Signed)
Physical Therapy Treatment Patient Details Name: Lawrence [name] MRN: [id number] DOB: [birth year] Today's Date: 10/29/2013    History of Present Illness Lawrence [name] is a 53 y.o. male presenting with worsened dyspnea and ARF. PMH is significant for chronic cough, CKD, poorly controlled HTN and DM, HLD, neuropathy, acute frontal sinusitis in August, and urinary retention.  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  Pt lives alone and takes SCAT if he has to go out.  Pt is planning to have kidney biopsy on 10-30-13    PT Comments    Pt doing well today.  He would benefit from walking with nursing staff to help build up his endurance.  He is safe with prosthetic - just needs chair following in case pt gets fatiqued.  Follow Up Recommendations  No PT follow up     Equipment Recommendations  None recommended by PT    Recommendations for Other Services       Precautions / Restrictions Precautions Precautions: Fall Required Braces or Orthoses:  (right prosthetic) Restrictions Weight Bearing Restrictions: No    Mobility  Bed Mobility Overal bed mobility: Independent                Transfers                    Ambulation/Gait Ambulation/Gait assistance: Min guard Ambulation Distance (Feet):  (140 feet x 2) Assistive device: Rolling walker (2 wheeled) (chair following pt) Gait Pattern/deviations: Step-through pattern     General Gait Details: Pt did not need any extra layers in prosthesis today - he feels his leg is more swollen than normal and had harder time getting it on (9-18 he wore additional 3 ply).  Pt cued on posture.  pt fatiqued at 140 feet and had to sit and rest and hten walked back.  pt surprised he has lost so much endurance since being in hospital   Stairs            Wheelchair Mobility    Modified Rankin (Stroke Patients Only)       Balance                                    Cognition  Arousal/Alertness: Awake/alert Behavior During Therapy: WFL for tasks assessed/performed Overall Cognitive Status: Within Functional Limits for tasks assessed                      Exercises      General Comments General comments (skin integrity, edema, etc.): reviewed skin integrity and improtance of stopping if any pain in residual imb and checking skin      Pertinent Vitals/Pain Pain Assessment: No/denies pain    Home Living                      Prior Function            PT Goals (current goals can now be found in the care plan section) Acute Rehab PT Goals Potential to Achieve Goals: Good Progress towards PT goals: Progressing toward goals    Frequency       PT Plan Current plan remains appropriate    Co-evaluation             End of Session Equipment Utilized During Treatment: Gait belt Activity Tolerance: Patient tolerated  treatment well Patient left: in chair;with call bell/phone within reach     Time: 0952-1012 PT Time Calculation (min): 20 min  Charges:  $Gait Training: 8-22 mins                    G Codes:      Judson Roch 10/29/2013, 10:37 AM 10/29/2013   Ranae Palms, PT

## 2013-10-29 NOTE — Progress Notes (Signed)
10/29/2013 4:21 AM Progress Notes The patient's hemoglobin has gone from 7.0 to 8.0 after receiving one Unit of PRBCs. Harriet Masson

## 2013-10-29 NOTE — Progress Notes (Signed)
I saw the patient and agree with the above assessment and plan.     Pt doing well, volume status much improved.  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  I suspect this is acclerated phase of diabetic kidney disease. Serologies pending.  For renal biopsy tomorrow, then likely can leave on Wednesday if labs and volume status stable.

## 2013-10-29 NOTE — Progress Notes (Signed)
FMTS Attending Note Patient seen and examined by me, discussed with resident team and I agree with Dr Lavone Neri note for today.  Patient is in good spirits. Ambulating with PT. Appreciate Nephrology consult, and plans for renal biopsy tomorrow.  [name], MD

## 2013-10-29 NOTE — Progress Notes (Signed)
Family Medicine Teaching Service Daily Progress Note Intern Pager: [phone]  Patient name: Lawrence [name] Medical record number: [id number] Date of birth: 03-May-1960 Age: 53 y.o. Gender: male  Primary Care Provider: [name], MD Consultants: Nephrology Code Status: Full  Pt Overview and Major Events to Date:  9/15 - admitted with dyspnea and ARF 9/16 - Started on IV lasix for diuresis 9/18- switched to PO lasix  Assessment and Plan: Lawrence [name] is a 53 y.o. male presenting with worsened dyspnea and ARF. PMH is significant for chronic cough, CKD, poorly controlled HTN and DM, HLD, neuropathy, and urinary retention.   #AoCKD. Cr 2.78 on admission up from baseline over past 6 months of 1.6-1.7, prior to that 1.2 (11/2012). Labs thus far most consistent with nephrotic syndrome (UPC 8). Most likely diabetic nephropathy. Currently diuresing with PO lasix - Nephrology consulted, appreciate assistance - Continue HTN and DM management - f/u complement, SPEP, UPEP, ANCA, ANA, serum light chains - Will have renal Bx this week, per nephrology given continued rise in Cr  #Dyspnea likely due to volume overload in the setting of AoCKD.  low suspicion for PE (negative LE dopplers, improvement of dyspnea with diuresis and lack of desats on room air). Completed full course abx for CAP. Sating ok on RA - Echo: 50% EF, grade 2 diastolic dysfunction, though limited study due to inability to position patient - Will re-attempt Echo once orthopnea improves, likely outpt   #UTI. UA remarkable for protein, nitrites, and leukocyte, and many bacteria. 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 Normocytic, elevated ferritin, low iron. Likely secondary to CKD, but possibly iron deficiency  playing a role. 8.2 -> 7.7>7 (9/20) - PO Fe - Colonoscopy recommendation if not already done at 50.  - Consider transfusion for HgB <7.0, will need to discuss with nephrology risk vs benefits of extra volume in already volume overloaded patient - Holding Aspirin  in setting of anemia   #Poorly controlled HTN normotensive currently - Continue norvasc, metoprolol  BID  - Continue to hold lisinopril in setting of AoCKD.   #DM. On 20U lantus at home. CBGs 100s-180s - Continue lantus but at decreased 15 units daily in setting of worsened renal function - sensitive SSI.  - DM educator consulted.   #RLE Wound- Well healing approximately 3cm irregular wound on RLE -bacitracin ointment bid  #HLD  - Continue lipitor  daily.   #Chronic Cough, ear pressure -Continue home flonase, PPI -Consider switching from ACEi to ARB (currently holding) -Add atrovent nasal spray for likely eustachian tube disfunction  FEN/GI: SLIV, Heart Healthy/Carb Modified diet  Prophylaxis: SCDs, no chemoprophylaxis in setting of anemia  Disposition: Admitted pending work up as above.  Subjective: Says his dyspnea is improving day by day. Has not tried laying flat since yesterday. No chest pain. No fevers or chills.   Objective: Temp:  [98 F (36.7 C)-99.1 F (37.3 C)] 98.7 F (37.1 C) (09/21 0604) Pulse Rate:  [67-83] 83 (09/21 0604) Resp:  [16-20] 18 (09/21 0604) BP: (100-137)/(47-73) 123/57 mmHg (09/21 0604) SpO2:  [99 %-100 %] 99 % (09/21 0604) Physical Exam: General: NAD, sitting up in bedside chair Cardiovascular: RRR, no murmurs appreciated Respiratory: nml WOB; CTAB decreased at bibasilar fields Abdomen: Soft, nontender, nondistended Extremities: Right BKA, Left leg with 1+ edema  to knee, 3-4cm well healing wound in RLE Skin: Warm, dry Neuro: Awake, alert  Laboratory:  Recent Labs Lab 10/27/13 0358 10/28/13 0437 10/29/13 0230  WBC 6.4 6.0 5.9  HGB 7.7* 7.0* 8.0*  HCT 22.9*  20.5* 23.1*  PLT 170 149* 148*    Recent Labs Lab 10/23/13 1230  10/27/13 0358 10/28/13 0437 10/29/13 0230  NA 144  < > 139 138 138  K 4.6  < > 4.3 4.1 4.3  CL 111  < > 103 103 104  CO2 19  < > BUN 47*  < > 53* 62* 65*  CREATININE 2.67*  < > 3.40* 4.02* 4.00*  CALCIUM 9.2  < > 8.9 8.5 8.6  PROT 6.8  --   --   --   --   BILITOT <0.2*  --   --   --   --   ALKPHOS 151*  --   --   --   --   ALT 13  --   --   --   --   AST 12  --   --   --   --   GLUCOSE 97  < > 142* 189* 183*  < > = values in this interval not displayed.  Imaging/Diagnostic Tests: CXR (9/18)  Grossly unchanged findings of pulmonary edema, small bilateral effusions and associated bibasilar opacities, left greater than right, atelectasis versus infiltrate. A follow-up chest radiograph in 4 to 6 weeks after treatment is recommended to ensure resolution.   Jacquiline Doe, MD 10/29/2013, 7:07 AM PGY-2, [facility] FPTS Intern pager: [phone], text pages welcome

## 2013-10-29 NOTE — Consult Note (Signed)
Chief Complaint: Acute on chronic renal failure Worsening renal fxn proteinuria  Referring Physician(s): [name]  History of Present Illness: Lawrence [name] is a 53 y.o. male  Pt with acute on chronic renal failure Worsening fxn; proteinuria Long hx poorly controlled HTN DM- R BKA Hx urinary retention--indwelling suprapubic catheter Admitted with increasing shortness of breath; fatigue; weakness Request for IR consult per [name] for renal biopsy I have seen and examined pt Scheduled for same 9/22 am  Past Medical History  Diagnosis Date  . Hypertension   . Chronic kidney disease   . Hypercholesteremia   . PONV (postoperative nausea and vomiting)   . Peripheral vascular disease   . H/O hiatal hernia     hx of years ago   . Pneumonia 10/23/2013  . IDDM (insulin dependent diabetes mellitus)   . Depression 08/2012    "just when foot first got cut off"    Past Surgical History  Procedure Laterality Date  . Amputation Right 05/03/2012    Procedure: Right First Ray AMPUTATION;  Surgeon: Eldred Manges, MD;  Location[facility];  Service: Orthopedics;  Laterality: Right;  . I&d extremity Right 05/29/2012    Procedure: IRRIGATION AND DEBRIDEMENT EXTREMITY- right foot;  Surgeon: Eldred Manges, MD;  Location: [facility] OR;  Service: Orthopedics;  Laterality: Right;  Right Foot Debridement, VAC Application  . Amputation Right 08/16/2012    Procedure: AMPUTATION BELOW KNEE;  Surgeon: Eldred Manges, MD;  Location: [facility] OR;  Service: Orthopedics;  Laterality: Right;  Right Below Knee Amputation  . Amputation Right 10/04/2012    Procedure: AMPUTATION BELOW KNEE REVISION;  Surgeon: Eldred Manges, MD;  Location: [facility] OR;  Service: Orthopedics;  Laterality: Right;  . Insertion of suprapubic catheter N/A 03/02/2013    Procedure: INSERTION OF SUPRAPUBIC CATHETER;  Surgeon: Su Grand, MD;  Location: [facility] ORS;  Service: Urology;  Laterality: N/A;  . Tonsillectomy  ~ 1967    Allergies: Review of patient's  allergies indicates no known allergies.  Medications: Prior to Admission medications   Medication Sig Start Date End Date Taking? Authorizing Provider  amLODipine (NORVASC) 10 MG tablet Take 1 tablet (10 mg total) by mouth daily. 10/09/13  Yes [name], MD  aspirin 81 MG tablet Take 81 mg by mouth at bedtime.    Yes Historical Provider, MD  atorvastatin (LIPITOR) 40 MG tablet Take 1 tablet (40 mg total) by mouth daily. 06/22/13  Yes [name], MD  Brinzolamide-Brimonidine [facility]) 1-0.2 % SUSP Place 1 drop into the left eye 2 (two) times daily.   Yes Historical Provider, MD  doxycycline (VIBRA-TABS) 100 MG tablet Take 1 tablet (100 mg total) by mouth 2 (two) times daily. 10/19/13 10/29/13 Yes [name], MD  fluticasone (FLONASE) 50 MCG/ACT nasal spray Place 2 sprays into both nostrils daily. 10/01/13  Yes [name], MD  insulin glargine (LANTUS) 100 UNIT/ML injection Inject 20 Units into the skin at bedtime.   Yes Historical Provider, MD  lisinopril (PRINIVIL,ZESTRIL) 20 MG tablet Take 1 tablet (20 mg total) by mouth daily. 05/04/13  Yes [name], MD  metoprolol tartrate (LOPRESSOR) 25 MG tablet Take 1 tablet (25 mg total) by mouth 2 (two) times daily. 05/04/13  Yes [name], MD  montelukast (SINGULAIR) 10 MG tablet Take 1 tablet (10 mg total) by mouth at bedtime. 08/07/13  Yes [name], MD  mupirocin ointment (BACTROBAN) 2 % Apply 1 application topically 2 (  two) times daily. Apply to affecte 08/13/13  Yes Janit Pagan, MD  omeprazole (PRILOSEC) 40 MG capsule Take 1 capsule (40 mg total) by mouth daily. 10/09/13  Yes [name], MD  oxybutynin (DITROPAN) 5 MG tablet Take 1 tablet (5 mg total) by mouth every 8 (eight) hours as needed for bladder spasms. 05/18/13  Yes Olivia [name], MD    History reviewed. No pertinent family history.  History   Social History  . Marital Status: Single    Spouse Name: N/A    Number of Children: N/A  . Years of Education: N/A    Social History Main Topics  . Smoking status: Never Smoker   . Smokeless tobacco: Never Used  . Alcohol Use: Yes     Comment: 10/23/2013 "couple times/month I'll have a beer"  . Drug Use: No  . Sexual Activity: Not Currently   Other Topics Concern  . None   Social History Narrative  . None    Review of Systems: A 12 point ROS discussed and pertinent positives are indicated in the HPI above.  All other systems are negative.  Review of Systems  Constitutional: Positive for fatigue. Negative for activity change, appetite change and unexpected weight change.  Respiratory: Positive for chest tightness and shortness of breath.   Cardiovascular: Negative for chest pain.  Gastrointestinal: Positive for nausea. Negative for vomiting.  Genitourinary: Positive for decreased urine volume. Negative for difficulty urinating.  Musculoskeletal: Negative for back pain.  Skin: Negative for color change.  Psychiatric/Behavioral: Negative for behavioral problems and confusion.    Vital Signs: BP 120/58  Pulse 83  Temp(Src) 98.7 F (37.1 C) (Oral)  Resp 18  Ht  (1.676 m)  Wt 95.1 kg (209 lb 10.5 oz)  BMI 33.86 kg/m2  SpO2 99%  Physical Exam  Constitutional: He is oriented to person, place, and time. He appears well-nourished.  Cardiovascular: Normal rate and regular rhythm.   No murmur heard. Pulmonary/Chest: Effort normal. He has wheezes.  Abdominal: Soft. Bowel sounds are normal. There is no tenderness.  Musculoskeletal: Normal range of motion.  R BKA  Neurological: He is oriented to person, place, and time.  Skin: Skin is warm and dry.  Psychiatric: He has a normal mood and affect. His behavior is normal. Judgment and thought content normal.    Imaging: Dg Chest 2 View  10/26/2013   CLINICAL DATA:  Volume overload, weakness  EXAM: CHEST  2 VIEW  COMPARISON:  10/24/18 15; 10/19/2013; 05/02/2012  FINDINGS: Grossly unchanged enlarged cardiac silhouette and mediastinal  contours. The pulmonary vasculature remains indistinct with cephalization of flow. Unchanged small bilateral effusions with associated bibasilar opacities, left greater than right. No new focal airspace opacities. No pneumothorax. Grossly unchanged bones.  IMPRESSION: Grossly unchanged findings of pulmonary edema, small bilateral effusions and associated bibasilar opacities, left greater than right, atelectasis versus infiltrate. A follow-up chest radiograph in 4 to 6 weeks after treatment is recommended to ensure resolution.   Electronically Signed   By: Simonne Come M.D.   On: 10/26/2013 08:05   X-ray Chest Pa And Lateral   10/23/2013   CLINICAL DATA:  Shortness of breath for several days  EXAM: CHEST  2 VIEW  COMPARISON:  10/19/2013  FINDINGS: Cardiac shadow is enlarged but stable. Bibasilar infiltrates are again noted with increasing pleural effusions bilaterally. No acute bony abnormality is seen.  IMPRESSION: Increasing bibasilar infiltrates with associated effusions.   Electronically Signed   By: Eulah Pont.D.  On: 10/23/2013 14:38   Dg Chest 2 View  10/19/2013   CLINICAL DATA:  Cough for 6 weeks.  EXAM: CHEST  2 VIEW  COMPARISON:  05/02/2012.  FINDINGS: There is bibasilar opacity new from the prior exam. This may reflect atelectasis or infiltrates or a combination. There are probable minimal effusions. No pulmonary edema. Remainder of the lungs is clear.  Cardiac silhouette is mildly enlarged. No mediastinal or hilar masses.  Bony thorax is demineralized but intact.  IMPRESSION: New bibasilar lung opacity which may be due to atelectasis, pneumonia or other infiltrate, or a combination. Probable minimal effusions. No overt pulmonary edema.   Electronically Signed   By: Amie Portland M.D.   On: 10/19/2013 10:20   US Renal  10/23/2013   CLINICAL DATA:  Acute renal failure.  EXAM: RENAL/URINARY TRACT ULTRASOUND COMPLETE  COMPARISON:  06/01/2012  FINDINGS: Right Kidney:  Length: 13 cm. Mild increased  renal parenchymal echogenicity and renal cortical thinning. No mass or stone. No hydronephrosis.  Left Kidney:  Length: 14 cm. Echogenicity within normal limits. No mass or hydronephrosis visualized.  Bladder:  Decompressed by a Foley catheter.  IMPRESSION: 1. No hydronephrosis. 2. Mild increased renal parenchymal echogenicity on the right with mild renal cortical thinning suggesting medical renal disease.   Electronically Signed   By: Amie Portland M.D.   On: 10/23/2013 15:17    Labs: Lab Results  Component Value Date   WBC 5.9 10/29/2013   HCT 23.1* 10/29/2013   MCV 80.5 10/29/2013   PLT 148* 10/29/2013   NA 138 10/29/2013   K 4.3 10/29/2013   CL 104 10/29/2013   CO2 22 10/29/2013   GLUCOSE 183* 10/29/2013   BUN 65* 10/29/2013   CREATININE 4.00* 10/29/2013   CALCIUM 8.6 10/29/2013   PROT 6.8 10/23/2013   ALBUMIN 2.3* 10/29/2013   AST 12 10/23/2013   ALT 13 10/23/2013   ALKPHOS 151* 10/23/2013   BILITOT <0.2* 10/23/2013   GFRNONAA 16* 10/29/2013   GFRAA 18* 10/29/2013   INR 1.21 10/28/2013    Assessment and Plan:  Acute on chronic renal failure Worsening renal fxn Proteinuria Now scheduled for random renal bx per [name] Pt aware of procedure benefits and risks and agreeable to proceed Consent signed and in chart  Thank you for this interesting consult.  I greatly enjoyed meeting JULION GATT and look forward to participating in their care.   Robet Leu PAC-IR   I spent a total of 30 minutes face to face in clinical consultation, greater than 50% of which was counseling/coordinating care for random renal bx

## 2013-10-29 NOTE — Progress Notes (Signed)
Subjective:  Patient seen at bedside this AM, no new complaints.  1.1L UO over apst 24 hours. Cr 4.00 today, generally unchanged from yesterday.  For renal biopsy tomorrow.  S/p 1 unit PRBC's yesterday, Hb 8.0 this AM.   Objective: Medications:   Scheduled Medications: . amLODipine  10 mg Oral Daily  . atorvastatin  40 mg Oral Daily  . bacitracin   Topical BID  . brimonidine  1 drop Left Eye BID  . brinzolamide  1 drop Left Eye BID  . calcitRIOL  0.25 mcg Oral Daily  . ferrous sulfate  325 mg Oral BID WC  . fluticasone  2 spray Each Nare Daily  . furosemide  40 mg Oral BID  . insulin aspart  0-9 Units Subcutaneous TID WC  . insulin glargine  15 Units Subcutaneous QHS  . ipratropium  2 spray Each Nare TID  . metoprolol tartrate  25 mg Oral BID  . pantoprazole  80 mg Oral Daily  . sodium chloride  3 mL Intravenous Q12H   PRN Meds:.acetaminophen, oxybutynin, senna, traZODone  BP 123/57  Pulse 83  Temp(Src) 98.7 F (37.1 C) (Oral)  Resp 18  Ht  (1.676 m)  Wt 209 lb 10.5 oz (95.1 kg)  BMI 33.86 kg/m2  SpO2 99%   Intake/Output Summary (Last 24 hours) at 10/29/13 0736 Last data filed at 10/28/13 2134  Gross per 24 hour  Intake  752.5 ml  Output   1125 ml  Net -372.5 ml    Weight change:   10/26/13 0536 209 lb 10.5 oz (95.1 kg) 10/25/13 0653 211 lb 3.2 oz (95.8 kg) 10/24/13 0419 214 lb 15.2 oz (97.5 kg) 10/23/13 1145 223 lb (101.152 kg)      EXAM:  General: Morbidly obese male, alert, cooperative, NAD. HEENT: PERRL, EOMI. Moist mucus membranes.  Neck: Full range of motion without pain, supple, no lymphadenopathy or carotid bruits Lungs: Air entry equal bilaterally. Bibasilar crackles, otherwise clear. No wheezes.  Heart: RRR, no murmurs, gallops, or rubs Abdomen: Soft, non-tender, non-distended, BS +. Suprapubic catheter tube present.  Extremities: Right leg BKA w/ prosthesis. LLE still w/ +2 pitting edema.  Neurologic: Alert & oriented X3, cranial  nerves II-XII intact, strength grossly intact, sensation intact to light touch   Labs: Basic Metabolic Panel:  Recent Labs Lab 10/23/13 1230  10/27/13 0358 10/28/13 0437 10/29/13 0230  NA 144  < > 139 138 138  K 4.6  < > 4.3 4.1 4.3  CL 111  < > 103 103 104  CO2 19  < > GLUCOSE 97  < > 142* 189* 183*  BUN 47*  < > 53* 62* 65*  CREATININE 2.67*  < > 3.40* 4.02* 4.00*  CALCIUM 9.2  < > 8.9 8.5 8.6  MG 2.1  --   --   --   --   PHOS 4.7*  < > 5.0* 5.2* 5.6*  < > = values in this interval not displayed.  Creatinine, Ser   Date/Time  Value  Ref Range  Status   10/29/2013 4.00 0.50 - 1.35 mg/dL Final  1/61/0960 4.54 0.98 - 1.35 mg/dL Final   02/27/1476 2.95   Diuretics reduced 0.50 - 1.35 mg/dL Final   07/29/3084 5.78   Diuretics changed po 0.50 - 1.35 mg/dL Final   4/69/6295 2.84 0.50 - 1.35 mg/dL Final   1/32/4401 0:27 AM  3.00*  diuresis initiated for CHF 0.50 - 1.35 mg/dL  Final  10/23/2013 12:30 PM  2.67*  0.50 - 1.35 mg/dL  Final   1/61/09  6.04  Lisinopril discontinued  0.50 - 1.35 mg/dL Final   06/12/07  8.11  9.14 - 1.35 mg/dL Final   08/16/27  5.62  1.30 - 1.35 mg/dL Final   8/65/78  4.69 Lisinopril restarted  0.50 - 1.35 mg/dL Final   08/07/5282 1:32 AM  1.63*  0.50 - 1.35 mg/dL  Final   44/02/270 5:36 AM  1.23  0.50 - 1.35 mg/dL  Final    Liver Function Tests:  Recent Labs Lab 10/23/13 1230  10/27/13 0358 10/28/13 0437 10/29/13 0230  AST 12  --   --   --   --   ALT 13  --   --   --   --   ALKPHOS 151*  --   --   --   --   BILITOT <0.2*  --   --   --   --   PROT 6.8  --   --   --   --   ALBUMIN 2.7*  < > 2.6* 2.2* 2.3*  < > = values in this interval not displayed. CBC:  Recent Labs Lab 10/23/13 1230  10/25/13 1350 10/26/13 0747 10/27/13 0358 10/28/13 0437 10/29/13 0230  WBC 5.0  < > 4.3 5.0 6.4 6.0 5.9  NEUTROABS 3.6  --   --   --   --   --   --   HGB 8.5*  < > 7.8* 8.2* 7.7* 7.0* 8.0*  HCT 25.4*  < > 23.0* 24.4* 22.9* 20.5* 23.1*  MCV 80.4   < > 80.4 81.6 80.4 82.3 80.5  PLT 156  < > 158 177 170 149* 148*  < > = values in this interval not displayed.  Cardiac Enzymes:  Recent Labs Lab 10/23/13 1149 10/23/13 1811  TROPONINI <0.30 <0.30    CBG:  Recent Labs Lab 10/28/13 0651 10/28/13 1130 10/28/13 1627 10/28/13 2128 10/29/13 0605  GLUCAP 146* 170* 158* 190* 157*    Assessment/Plan: 53 y.o. male w/ PMHx of HTN, HLD, DM type II, s/p right BKA, chronic suprapubic catheter, and CKD stage 3, admitted w/ worsening SOB and acute on chronic renal failure w/ nephrotic range proteinuria.   Acute on Chronic CKD- CKD stage 3 associated w/ poorly controlled HTN and DM type II. Baseline appears to be ~1.2, however, this seems to have steadily increased over the past year. Renal US suggestive of medical renal disease, w/ no hydronephrosis. Additionally urine protein creatinine ratio 8.06 c/w nephrotic range proteinuria. Most likely proteinuria is 2/2 diabetes, however, significant concern for another etiology given acute onset of symptoms and continued worsening renal function. 1.1L urine output in 24 hours, w/ adequate diuresis, Cr not worsened today, now 4.00. On Lasix 40 mg bid currently. Complement, SPEP, UPEP, ANCA, ANA, Hep B Ag, Hep C, serum light chains pending. For IR renal biopsy tomorrow to rule out other causes of nephrotic syndrome.  Cough- Felt that this was largely contributed to by possible underlying new onset CHF in combination w/ preceding respiratory illness and nephrotic syndrome. ECHO from 9/16 shows EF of 50% w/ grade 2 diastolic dysfunction. Repeat CXR w/ minimal change, still suggestive of pleural effusions. Lasix as above. History of urinary retention w/ suprapubic catheter- Currently urinating normally, suprapubic catheter plugged. No dysuria or other urinary symptoms. Urine Culture from admission w/ >100,000 colonies serratia fonticola. Received 1 dose Levaquin. Secondary Hyperparathyroidism- Previous PTH in  05/2012 was 322.  Started on Zemplar by Dr. Briant Cedar in 06/2012, but it is unclear if patient has been taking this (not on medication list either). Noted to have elevated phos of 4.7 on admission.  Repeat PTH 243 on 9/16. Continue po calcitriol. Anemia- Thought to be 2/2 iron deficiency in combination w/ CKD, started on Ferrous sulfate 325 bid on admission. Hb decreased to 8.0 this AM, s/p 1 unit PRBC's yesterday in preparation for renal biopsy. Continue iron supplementation. HTN- BP well controlled. Currently on Norvasc 10 mg qd + Lopressor 25 mg bid. Continue strict BP control.  DM type II- Most recent HbA1c 7.5. Continue current insulin regimen.    Signed: Lars Masson, MD 10/29/2013 7:36 AM

## 2013-10-30 ENCOUNTER — Inpatient Hospital Stay (HOSPITAL_COMMUNITY): Payer: Medicaid Other

## 2013-10-30 DIAGNOSIS — N184 Chronic kidney disease, stage 4 (severe): Secondary | ICD-10-CM

## 2013-10-30 LAB — UIFE/LIGHT CHAINS/TP QN, 24-HR UR
ALPHA 1 UR: DETECTED — AB
ALPHA 2 UR: DETECTED — AB
Albumin, U: DETECTED
Beta, Urine: DETECTED — AB
GAMMA UR: DETECTED — AB
Total Protein, Urine: 156 mg/dL — ABNORMAL HIGH (ref 5–25)

## 2013-10-30 LAB — GLUCOSE, CAPILLARY
GLUCOSE-CAPILLARY: 216 mg/dL — AB (ref 70–99)
GLUCOSE-CAPILLARY: 182 mg/dL — AB (ref 70–99)
GLUCOSE-CAPILLARY: 92 mg/dL (ref 70–99)
Glucose-Capillary: 218 mg/dL — ABNORMAL HIGH (ref 70–99)

## 2013-10-30 LAB — IRON AND TIBC
IRON: 30 ug/dL — AB (ref 42–135)
Saturation Ratios: 15 % — ABNORMAL LOW (ref 20–55)
TIBC: 200 ug/dL — AB (ref 215–435)
UIBC: 170 ug/dL (ref 125–400)

## 2013-10-30 LAB — CBC
HCT: 24.9 % — ABNORMAL LOW (ref 39.0–52.0)
HEMOGLOBIN: 8.5 g/dL — AB (ref 13.0–17.0)
MCH: 27.7 pg (ref 26.0–34.0)
MCHC: 34.1 g/dL (ref 30.0–36.0)
MCV: 81.1 fL (ref 78.0–100.0)
PLATELETS: 181 10*3/uL (ref 150–400)
RBC: 3.07 MIL/uL — AB (ref 4.22–5.81)
RDW: 14.4 % (ref 11.5–15.5)
WBC: 6.5 10*3/uL (ref 4.0–10.5)

## 2013-10-30 LAB — RENAL FUNCTION PANEL
Albumin: 2.3 g/dL — ABNORMAL LOW (ref 3.5–5.2)
Anion gap: 15 (ref 5–15)
BUN: 68 mg/dL — ABNORMAL HIGH (ref 6–23)
CALCIUM: 8.7 mg/dL (ref 8.4–10.5)
CHLORIDE: 101 meq/L (ref 96–112)
CO2: 21 meq/L (ref 19–32)
Creatinine, Ser: 4.16 mg/dL — ABNORMAL HIGH (ref 0.50–1.35)
GFR calc non Af Amer: 15 mL/min — ABNORMAL LOW (ref >90)
GFR, EST AFRICAN AMERICAN: 18 mL/min — AB (ref >90)
GLUCOSE: 214 mg/dL — AB (ref 70–99)
Phosphorus: 5.7 mg/dL — ABNORMAL HIGH (ref 2.3–4.6)
Potassium: 4.1 mEq/L (ref 3.7–5.3)
SODIUM: 137 meq/L (ref 137–147)

## 2013-10-30 LAB — PROTEIN ELECTROPHORESIS, SERUM
Albumin ELP: 48.8 % — ABNORMAL LOW (ref 55.8–66.1)
Alpha-1-Globulin: 7.7 % — ABNORMAL HIGH (ref 2.9–4.9)
Alpha-2-Globulin: 18.3 % — ABNORMAL HIGH (ref 7.1–11.8)
BETA 2: 6.5 % (ref 3.2–6.5)
BETA GLOBULIN: 6 % (ref 4.7–7.2)
GAMMA GLOBULIN: 12.7 % (ref 11.1–18.8)
M-SPIKE, %: NOT DETECTED g/dL
Total Protein ELP: 5.3 g/dL — ABNORMAL LOW (ref 6.0–8.3)

## 2013-10-30 LAB — C4 COMPLEMENT: Complement C4, Body Fluid: 34 mg/dL (ref 10–40)

## 2013-10-30 LAB — C3 COMPLEMENT: C3 Complement: 94 mg/dL (ref 90–180)

## 2013-10-30 LAB — FERRITIN: Ferritin: 351 ng/mL — ABNORMAL HIGH (ref 22–322)

## 2013-10-30 MED ORDER — MIDAZOLAM HCL 2 MG/2ML IJ SOLN
INTRAMUSCULAR | Status: AC | PRN
  Administered 2013-10-30: 1 mg via INTRAVENOUS

## 2013-10-30 MED ORDER — INSULIN GLARGINE 100 UNIT/ML ~~LOC~~ SOLN
18.0000 [IU] | Freq: Every day | SUBCUTANEOUS | Status: DC
  Administered 2013-10-30 – 2013-10-31 (×2): 18 [IU] via SUBCUTANEOUS
  Filled 2013-10-30 (×2): qty 0.18

## 2013-10-30 MED ORDER — FENTANYL CITRATE 0.05 MG/ML IJ SOLN
INTRAMUSCULAR | Status: AC
  Filled 2013-10-30: qty 4

## 2013-10-30 MED ORDER — LIDOCAINE HCL (PF) 1 % IJ SOLN
INTRAMUSCULAR | Status: AC
  Filled 2013-10-30: qty 10

## 2013-10-30 MED ORDER — MIDAZOLAM HCL 2 MG/2ML IJ SOLN
INTRAMUSCULAR | Status: AC
  Filled 2013-10-30: qty 4

## 2013-10-30 MED ORDER — FENTANYL CITRATE 0.05 MG/ML IJ SOLN
INTRAMUSCULAR | Status: AC | PRN
  Administered 2013-10-30: 25 ug via INTRAVENOUS

## 2013-10-30 MED ORDER — HYDROCODONE-ACETAMINOPHEN 5-325 MG PO TABS
1.0000 | ORAL_TABLET | ORAL | Status: DC | PRN
  Administered 2013-11-02: 2 via ORAL
  Filled 2013-10-30: qty 2

## 2013-10-30 MED ORDER — SODIUM CHLORIDE 0.9 % IV SOLN
INTRAVENOUS | Status: AC | PRN
  Administered 2013-10-30: 30 mL/h via INTRAVENOUS

## 2013-10-30 NOTE — Progress Notes (Signed)
FMTS Attending Note Patient seen and examined by me this morning, he reports improvement in his PND since admission but cannot sleep in bed ("too uncomfortable"). Going for renal biopsy today.  Appreciate Renal consult and plan; it is my understanding that Lawrence [name] will follow up with [name] in the office as his nephrologist. Likely discharge tomorrow; may follow up with me (I am his primary) in the coming 2 weeks.  [name], MD

## 2013-10-30 NOTE — Progress Notes (Addendum)
I saw the patient and agree with the above assessment and plan.    Patient is doing well this morning and a renal biopsy is planned. Renal function is relatively stable and his volume status is unchanged. Neurologic workup has been negative. 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 I think reasonabale to pursue AVF at this time as well, doesn't necessarily have to be completed before discharge.  Will discuss with [facility].

## 2013-10-30 NOTE — Procedures (Signed)
Interventional Radiology Procedure Note  Procedure: Random biopsy LEFT kidney Complications: None immediate Recommendations: - Bedrest x 4 hrs - Path is pending  Signed,  Sterling Big, MD

## 2013-10-30 NOTE — Progress Notes (Signed)
Subjective: Patient seen at bedside this AM, no new complaints.  2L UO over past 24 hours. Cr 4.16 today (GFR 15). For renal biopsy this AM.  Hb 8.5 this AM.   Objective: Medications:   Scheduled Medications: . amLODipine  10 mg Oral Daily  . atorvastatin  40 mg Oral Daily  . bacitracin   Topical BID  . brimonidine  1 drop Left Eye BID  . brinzolamide  1 drop Left Eye BID  . calcitRIOL  0.25 mcg Oral Daily  . ferrous sulfate  325 mg Oral BID WC  . fluticasone  2 spray Each Nare Daily  . furosemide  40 mg Oral BID  . insulin aspart  0-9 Units Subcutaneous TID WC  . insulin glargine  15 Units Subcutaneous QHS  . ipratropium  2 spray Each Nare TID  . metoprolol tartrate  25 mg Oral BID  . pantoprazole  80 mg Oral Daily  . sodium chloride  3 mL Intravenous Q12H   PRN Meds:.acetaminophen, oxybutynin, senna, traZODone  BP 143/56  Pulse 80  Temp(Src) 99.4 F (37.4 C) (Oral)  Resp 18  Ht  (1.676 m)  Wt 209 lb 10.5 oz (95.1 kg)  BMI 33.86 kg/m2  SpO2 98%   Intake/Output Summary (Last 24 hours) at 10/30/13 0721 Last data filed at 10/30/13 4098  Gross per 24 hour  Intake    480 ml  Output   2050 ml  Net  -1570 ml    Weight change:   10/26/13 0536 209 lb 10.5 oz (95.1 kg) 10/25/13 0653 211 lb 3.2 oz (95.8 kg) 10/24/13 0419 214 lb 15.2 oz (97.5 kg) 10/23/13 1145 223 lb (101.152 kg)      EXAM:  General: Morbidly obese male, alert, cooperative, NAD. HEENT: PERRL, EOMI. Moist mucus membranes.  Neck: Full range of motion without pain, supple, no lymphadenopathy or carotid bruits Lungs: Air entry clear and equal bilaterally. No wheezes or rales heard on exam today.  Heart: RRR, no murmurs, gallops, or rubs Abdomen: Soft, non-tender, non-distended, BS +. Suprapubic catheter tube present.  Extremities: Right leg BKA w/ prosthesis. LLE still w/ +2 pitting edema.  Neurologic: Alert & oriented X3, cranial nerves II-XII intact, strength grossly intact, sensation intact  to light touch   Labs: Basic Metabolic Panel:  Recent Labs Lab 10/23/13 1230  10/28/13 0437 10/29/13 0230 10/30/13 0547  NA 144  < > 138 138 137  K 4.6  < > 4.1 4.3 4.1  CL 111  < > 103 104 101  CO2 19  < > GLUCOSE 97  < > 189* 183* 214*  BUN 47*  < > 62* 65* 68*  CREATININE 2.67*  < > 4.02* 4.00* 4.16*  CALCIUM 9.2  < > 8.5 8.6 8.7  MG 2.1  --   --   --   --   PHOS 4.7*  < > 5.2* 5.6* 5.7*  < > = values in this interval not displayed.  Creatinine, Ser   Date/Time  Value  Ref Range  Status   10/30/2013 4.16 0.50 - 1.35 mg/dL Final   02/27/1476 2.95 0.50 - 1.35 mg/dL Final  07/29/3084 5.78 4.69 - 1.35 mg/dL Final   08/07/5282 1.32   Diuretics reduced 0.50 - 1.35 mg/dL Final   4/40/1027 2.53   Diuretics changed po 0.50 - 1.35 mg/dL Final   6/64/4034 7.42 0.50 - 1.35 mg/dL Final   5/95/6387 5:64 AM  3.00*  diuresis initiated for CHF  0.50 - 1.35 mg/dL  Final   6/96/2952 84:13 PM  2.67*  0.50 - 1.35 mg/dL  Final   2/44/01  0.27  Lisinopril discontinued  0.50 - 1.35 mg/dL Final   03/15/34  6.44  0.34 - 1.35 mg/dL Final   08/12/23  9.56  3.87 - 1.35 mg/dL Final   5/64/33  2.95 Lisinopril restarted  0.50 - 1.35 mg/dL Final   1/88/4166 0:63 AM  1.63*  0.50 - 1.35 mg/dL  Final   01/60/1093 2:35 AM  1.23  0.50 - 1.35 mg/dL  Final    Liver Function Tests:  Recent Labs Lab 10/23/13 1230  10/28/13 0437 10/29/13 0230 10/30/13 0547  AST 12  --   --   --   --   ALT 13  --   --   --   --   ALKPHOS 151*  --   --   --   --   BILITOT <0.2*  --   --   --   --   PROT 6.8  --   --   --   --   ALBUMIN 2.7*  < > 2.2* 2.3* 2.3*  < > = values in this interval not displayed. CBC:  Recent Labs Lab 10/23/13 1230  10/26/13 0747 10/27/13 0358 10/28/13 0437 10/29/13 0230 10/30/13 0547  WBC 5.0  < > 5.0 6.4 6.0 5.9 6.5  NEUTROABS 3.6  --   --   --   --   --   --   HGB 8.5*  < > 8.2* 7.7* 7.0* 8.0* 8.5*  HCT 25.4*  < > 24.4* 22.9* 20.5* 23.1* 24.9*  MCV 80.4  < > 81.6 80.4 82.3  80.5 81.1  PLT 156  < > 177 170 149* 148* 181  < > = values in this interval not displayed.  Cardiac Enzymes:  Recent Labs Lab 10/23/13 1149 10/23/13 1811  TROPONINI <0.30 <0.30    CBG:  Recent Labs Lab 10/29/13 0605 10/29/13 1109 10/29/13 1615 10/29/13 2120 10/30/13 0633  GLUCAP 157* 174* 157* 200* 216*    Assessment/Plan: 53 y.o. male w/ PMHx of HTN, HLD, DM type II, s/p right BKA, chronic suprapubic catheter, and CKD stage 3, admitted w/ worsening SOB and acute on chronic renal failure w/ nephrotic range proteinuria.   Acute on Chronic CKD- CKD stage 3 associated w/ poorly controlled HTN and DM type II. Baseline appears to be ~1.2, however, this seems to have steadily increased over the past year. Renal US suggestive of medical renal disease, w/ no hydronephrosis. Urine protein creatinine ratio 8.06 c/w nephrotic range proteinuria. Most likely proteinuria is 2/2 diabetes, however, some concern for another etiology given acute onset of symptoms and continued worsening renal function. 2L urine output in 24 hours, w/ adequate diuresis, Cr 4.16 today. On Lasix 40 mg bid currently. C3, C4 levels wnl, ANCA, Hep B, Hep C, ANA, all negative. SPEP, UPEP pending. For renal biopsy this AM to rule out other causes of nephrotic syndrome.  Cough- Improved. Felt that this was largely contributed to by possible underlying new onset CHF in combination w/ preceding respiratory illness and nephrotic syndrome. ECHO from 9/16 shows EF of 50% w/ grade 2 diastolic dysfunction. Repeat CXR w/ minimal change, still suggestive of pleural effusions. Lasix as above. History of urinary retention w/ suprapubic catheter- Currently urinating normally, suprapubic catheter plugged. No dysuria or other urinary symptoms. Urine Culture from admission w/ >100,000 colonies serratia fonticola. Received 1 dose Levaquin. Secondary Hyperparathyroidism- Previous  PTH in 05/2012 was 322. Started on Zemplar by Dr. Briant Cedar in  06/2012, but it is unclear if patient has been taking this (not on medication list either). Noted to have elevated phos of 4.7 on admission.  Repeat PTH 243 on 9/16. Continue po calcitriol. Anemia- Thought to be 2/2 iron deficiency in combination w/ CKD, started on Ferrous sulfate 325 bid on admission. Hb 8.5 this AM, s/p 1 unit PRBC's 10/28/13 in preparation for renal biopsy. Continue iron supplementation. Will repeat iron studies and consider giving IV iron + Aranesp pending results.  HTN- BP well controlled. Currently on Norvasc 10 mg qd + Lopressor 25 mg bid. Continue strict BP control.  DM type II- Most recent HbA1c 7.5. Continue current insulin regimen.    Signed: Lars Masson, MD 10/30/2013 7:21 AM

## 2013-10-30 NOTE — Consult Note (Signed)
[facility] Consult  Reason for Consult:  Permanent access Referring Physician:  Sanford MRN #:  [id number]  History of Present Illness: This is a 52 y.o. male who admitted on 10/23/13 for worsening dyspnea and cough.  He has a past medical history of chronic kidney disease stage 3 with poorly controlled hypertension and insulin dependent diabetes mellitus II, s/p right BKA in 2014 and chronic suprapubic catheter (02/2013 by Dr. Nesi). On this admission, he was found to have worsening renal function. His baseline creatinine is 1.2. We have been consulted for permanent dialysis access placement.   The patient is left hand dominant. The patient has not had previous access procedures. He is not yet on dialysis.  He currently denies any chest pain or shortness of breath. He notes a productive cough.    He has hypertension not well managed with three antihypertensive agents. He has diabetes not well managed with insulin. He has hyperlipidemia managed with a statin. He takes a daily aspirin.  Past Medical History  Diagnosis Date  . Hypertension   . Chronic kidney disease   . Hypercholesteremia   . PONV (postoperative nausea and vomiting)   . Peripheral vascular disease   . H/O hiatal hernia     hx of years ago   . Pneumonia 10/23/2013  . IDDM (insulin dependent diabetes mellitus)   . Depression 08/2012    "just when foot first got cut off"   Past Surgical History  Procedure Laterality Date  . Amputation Right 05/03/2012    Procedure: Right [place] AMPUTATION;  Surgeon: Macarthur C Yates, MD;  Location: [facility] OR;  Service: Orthopedics;  Laterality: Right;  . I&d extremity Right 05/29/2012    Procedure: IRRIGATION AND DEBRIDEMENT EXTREMITY- right foot;  Surgeon: Hanz C Yates, MD;  Location: [facility] OR;  Service: Orthopedics;  Laterality: Right;  Right Foot Debridement, VAC Application  . Amputation Right 08/16/2012    Procedure: AMPUTATION BELOW KNEE;  Surgeon: Aleck C Yates, MD;   Location: [facility] OR;  Service: Orthopedics;  Laterality: Right;  Right Below Knee Amputation  . Amputation Right 10/04/2012    Procedure: AMPUTATION BELOW KNEE REVISION;  Surgeon: Nephtali C Yates, MD;  Location: [facility] OR;  Service: Orthopedics;  Laterality: Right;  . Insertion of suprapubic catheter N/A 03/02/2013    Procedure: INSERTION OF SUPRAPUBIC CATHETER;  Surgeon: Marc Nesi, MD;  Location: [facility];  Service: Urology;  Laterality: N/A;  . Tonsillectomy  ~ 1967   History   Social History  . Marital Status: Single    Spouse Name: N/A    Number of Children: N/A  . Years of Education: N/A   Occupational History  . Not on file.   Social History Main Topics  . Smoking status: Never Smoker   . Smokeless tobacco: Never Used  . Alcohol Use: Yes     Comment: 10/23/2013 "couple times/month I'll have a beer"  . Drug Use: No  . Sexual Activity: Not Currently   Other Topics Concern  . Not on file   Social History Narrative  . No narrative on file   He has never been a smoker.  History reviewed. No pertinent family history.  No family history coronary artery disease.  No current facility-administered medications on file prior to encounter.   Current Outpatient Prescriptions on File Prior to Encounter  Medication Sig Dispense Refill  . amLODipine (NORVASC) 10 MG tablet Take 1 tablet (10 mg total) by mouth daily.  90 tablet  3  .   aspirin 81 MG tablet Take 81 mg by mouth at bedtime.       . atorvastatin (LIPITOR) 40 MG tablet Take 1 tablet (40 mg total) by mouth daily.  30 tablet  6  . fluticasone (FLONASE) 50 MCG/ACT nasal spray Place 2 sprays into both nostrils daily.  16 g  1  . insulin glargine (LANTUS) 100 UNIT/ML injection Inject 20 Units into the skin at bedtime.      . lisinopril (PRINIVIL,ZESTRIL) 20 MG tablet Take 1 tablet (20 mg total) by mouth daily.  90 tablet  4  . metoprolol tartrate (LOPRESSOR) 25 MG tablet Take 1 tablet (25 mg total) by mouth 2 (two) times daily.  180 tablet  4    . montelukast (SINGULAIR) 10 MG tablet Take 1 tablet (10 mg total) by mouth at bedtime.  30 tablet  3  . mupirocin ointment (BACTROBAN) 2 % Apply 1 application topically 2 (two) times daily. Apply to affecte  15 g  0  . omeprazole (PRILOSEC) 40 MG capsule Take 1 capsule (40 mg total) by mouth daily.  30 capsule  3  . oxybutynin (DITROPAN) 5 MG tablet Take 1 tablet (5 mg total) by mouth every 8 (eight) hours as needed for bladder spasms.  30 tablet  0   No Known Allergies  REVIEW OF SYSTEMS:  (Positives checked otherwise negative)  CARDIOVASCULAR:  [] chest pain, [] chest pressure, [] palpitations, [] shortness of breath when laying flat, [] shortness of breath with exertion,  [] pain in feet when walking, [] pain in feet when laying flat, [] history of blood clot in veins (DVT), [] history of phlebitis, [x] swelling in legs, [] varicose veins  PULMONARY:  [x] productive cough, [] asthma, [] wheezing  NEUROLOGIC:  [] weakness in arms or legs, [] numbness in arms or legs, [] difficulty speaking or slurred speech, [] temporary loss of vision in one eye, [] dizziness  HEMATOLOGIC:  [] bleeding problems, [] problems with blood clotting too easily  MUSCULOSKEL:  [] joint pain, [] joint swelling  GASTROINTEST:  [] vomiting blood, [] blood in stool     GENITOURINARY:  [] burning with urination, [] blood in urine  PSYCHIATRIC:  [x] history of major depression  INTEGUMENTARY:  [] rashes, []   CONSTITUTIONAL:  [] fever, [] chills  Physical Examination  Filed Vitals:   10/30/13 1036 10/30/13 1047 10/30/13 1052 10/30/13 1057  BP: 148/61 141/62 140/59 135/75  Pulse: 79 83 81 80  Temp:      TempSrc:      Resp: 21 22 23 24  Height:      Weight:      SpO2: 99% 100% 98% 96%   Body mass index is 33.86 kg/(m^2).  General: A&O x 3, Morbidly obese male resting in bed in NAD  Head: [place]/AT  Eyes: EOMI, mild scleral icterus  Neck: Supple, obese  Pulmonary: Sym exp, good air movt, CTAB, no  rales, rhonchi, & wheezing  Cardiac: RRR, Nl S1, S2, no Murmurs, rubs or gallops, left carotid bruit  Vascular: Vessel Right Left  Radial Palpable Palpable  Ulnar Palpable Palpable  Aorta Not palpable N/A  Femoral Palpable Not palpable  Popliteal Not palpable Not palpable  PT BKA Not palpable  DP BKA Faintly palpable 1+   Gastrointestinal: soft, NTND, obese, - HSM, - masses, suprapubic catheter in place  Musculoskeletal: M/S 5/5 throughout. Extremities without ischemic changes.   Neurologic: CN 2-12 grossly intact. Pain and light touch   intact in extremities.   Psychiatric: Judgment intact, Mood & affect appropriate for pt's clinical situation, history of depression  Dermatologic: See M/S exam for extremity exam, no rashes otherwise noted  Medical Decision Making  Emma J Divita is a 52 y.o. male who presents with acute on chronic kidney disease (CKD stage 3) not yet on dialysis. He is status post renal biopsy today.   Vein mapping has been ordered. The patient is left handed.   Had an extensive discussion with this patient in regards to the nature of access surgery, including risk, benefits, and alternatives.  At this time, he is not interested in having surgery and would like to have more time to think about it. He does say that he is open to having dialysis if it is medically necessary in the future. Advised him to follow up as an outpatient to discuss access placement. Dr. Treena Cosman to see patient.   Kimberly Trinh, PA-C [facility] Office: [phone] Pager: [phone]  10/30/2013, 11:02 AM  Agree with above. Currently the patient tells me he is not agreeable to proceed with hemodialysis access. He does tell me that he is agreeable to complete his workup. His vein map has been ordered. I have also ordered an upper extremity arterial Doppler study. He had a kidney biopsy today and request that these studies be done tomorrow.  Latorria Zeoli, MD, FACS Beeper [phone] 10/30/2013       

## 2013-10-30 NOTE — Plan of Care (Signed)
Problem: Phase III Progression Outcomes Goal: Voiding independently Outcome: Adequate for Discharge Pt with suprapubic catheter.Lawrence [name], Randall An RN

## 2013-10-30 NOTE — Progress Notes (Signed)
Family Medicine Teaching Service Daily Progress Note Intern Pager: [phone]  Patient name: Lawrence [name] Medical record number: [id number] Date of birth: Sep 10, 1960 Age: 53 y.o. Gender: male  Primary Care Provider: [name], MD Consultants: Nephrology Code Status: Full  Pt Overview and Major Events to Date:  9/15 - admitted with dyspnea and ARF 9/16 - Started on IV lasix for diuresis 9/18- switched to PO lasix 9/22- Renal biopsy  Assessment and Plan: Lawrence [name] is a 53 y.o. male presenting with worsened dyspnea and ARF. PMH is significant for chronic cough, CKD, poorly controlled HTN and DM, HLD, neuropathy, and urinary retention.   #AoCKD. Cr 2.78 on admission up from baseline over past 6 months of 1.6-1.7, prior to that 1.2 (11/2012). Labs thus far most consistent with nephrotic syndrome (UPC 8). Most likely diabetic nephropathy. Currently diuresing with PO lasix - Nephrology consulted, appreciate assistance - Continue HTN and DM management - f/u SPEP, UPEP - Elevated kappa (20.10) and lambda (10.80) free light chains - ANCA, ANA, complement wnl - Scheduled to have renal biopsy 9/22  #Dyspnea likely due to volume overload in the setting of AoCKD.  low suspicion for PE (negative LE dopplers, improvement of dyspnea with diuresis and lack of desats on room air). Completed full course abx for CAP. Sating ok on RA - Echo: 50% EF, grade 2 diastolic dysfunction, though limited study due to inability to position patient - Will re-attempt Echo once orthopnea improves, likely outpt  #UTI. UA remarkable for protein, nitrites, and leukocyte, and many bacteria. 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 Normocytic, elevated ferritin, low iron. Likely secondary to CKD,  but possibly iron deficiency playing a role.  - PO Fe - Colonoscopy recommendation if not already done at 50.  - Consider transfusion for HgB <7.0, will need to discuss with nephrology risk vs benefits of extra volume in already volume overloaded patient - Holding Aspirin  in setting of anemia   #Poorly controlled HTN normotensive currently - Continue norvasc, metoprolol  BID  - Continue to hold lisinopril in setting of AoCKD.   #DM. On 20U lantus at home. CBGs moderately well controlled here - Lantus 18U daily - sensitive SSI.  - DM educator consulted.   #RLE Wound- Well healing approximately 3cm irregular wound on RLE -bacitracin ointment bid  #HLD  - Continue lipitor  daily.   #Chronic Cough, ear pressure -Continue home flonase, PPI -Consider switching from ACEi to ARB (currently holding) -Add atrovent nasal spray for likely eustachian tube disfunction  FEN/GI: SLIV, Heart Healthy/Carb Modified diet  Prophylaxis: SCDs, no chemoprophylaxis in setting of anemia  Disposition: Admitted pending work up as above.  Subjective: NAEON. Had a few episodes of dyspnea last night while trying to get out of chair. No chest pain. Was able to recline in chair comfortably.   Objective: Temp:  [98.6 F (37 C)-99.4 F (37.4 C)] 99.4 F (37.4 C) (09/22 0559) Pulse Rate:  [72-81] 80 (09/22 0559) Resp:  [18] 18 (09/22 0559) BP: (120-148)/(56-60) 143/56 mmHg (09/22 0559) SpO2:  [98 %-99 %] 98 % (09/22 0559) Physical Exam: General: NAD, sitting up in bedside chair Cardiovascular: RRR, no murmurs appreciated Respiratory: nml WOB; CTAB though decreased airflow at bibasilar fields Abdomen: Soft, nontender, nondistended Extremities: Right BKA, Left leg with 2+ edema to  knee, 3-4cm well healing wound in RLE Skin: Warm, dry Neuro: Awake, alert  Laboratory:  Recent Labs Lab 10/28/13 0437 10/29/13 0230 10/30/13 0547  WBC 6.0 5.9 6.5  HGB 7.0* 8.0* 8.5*  HCT 20.5* 23.1*  24.9*  PLT 149* 148* 181    Recent Labs Lab 10/23/13 1230  10/28/13 0437 10/29/13 0230 10/30/13 0547  NA 144  < > 138 138 137  K 4.6  < > 4.1 4.3 4.1  CL 111  < > 103 104 101  CO2 19  < > BUN 47*  < > 62* 65* 68*  CREATININE 2.67*  < > 4.02* 4.00* 4.16*  CALCIUM 9.2  < > 8.5 8.6 8.7  PROT 6.8  --   --   --   --   BILITOT <0.2*  --   --   --   --   ALKPHOS 151*  --   --   --   --   ALT 13  --   --   --   --   AST 12  --   --   --   --   GLUCOSE 97  < > 189* 183* 214*  < > = values in this interval not displayed.  Imaging/Diagnostic Tests: CXR (9/18)  Grossly unchanged findings of pulmonary edema, small bilateral effusions and associated bibasilar opacities, left greater than right, atelectasis versus infiltrate. A follow-up chest radiograph in 4 to 6 weeks after treatment is recommended to ensure resolution.   Jacquiline Doe, MD 10/30/2013, 7:17 AM PGY-1, [facility] FPTS Intern pager: [phone], text pages welcome

## 2013-10-31 LAB — RENAL FUNCTION PANEL
Albumin: 2.5 g/dL — ABNORMAL LOW (ref 3.5–5.2)
Anion gap: 13 (ref 5–15)
BUN: 71 mg/dL — AB (ref 6–23)
CALCIUM: 8.9 mg/dL (ref 8.4–10.5)
CO2: 23 meq/L (ref 19–32)
Chloride: 103 mEq/L (ref 96–112)
Creatinine, Ser: 4.22 mg/dL — ABNORMAL HIGH (ref 0.50–1.35)
GFR calc Af Amer: 17 mL/min — ABNORMAL LOW (ref >90)
GFR calc non Af Amer: 15 mL/min — ABNORMAL LOW (ref >90)
GLUCOSE: 191 mg/dL — AB (ref 70–99)
Phosphorus: 5.4 mg/dL — ABNORMAL HIGH (ref 2.3–4.6)
Potassium: 4 mEq/L (ref 3.7–5.3)
SODIUM: 139 meq/L (ref 137–147)

## 2013-10-31 LAB — GLUCOSE, CAPILLARY
GLUCOSE-CAPILLARY: 254 mg/dL — AB (ref 70–99)
GLUCOSE-CAPILLARY: 170 mg/dL — AB (ref 70–99)
Glucose-Capillary: 177 mg/dL — ABNORMAL HIGH (ref 70–99)
Glucose-Capillary: 237 mg/dL — ABNORMAL HIGH (ref 70–99)

## 2013-10-31 LAB — CBC
HCT: 25.7 % — ABNORMAL LOW (ref 39.0–52.0)
HEMOGLOBIN: 8.5 g/dL — AB (ref 13.0–17.0)
MCH: 27.6 pg (ref 26.0–34.0)
MCHC: 33.1 g/dL (ref 30.0–36.0)
MCV: 83.4 fL (ref 78.0–100.0)
PLATELETS: 183 10*3/uL (ref 150–400)
RBC: 3.08 MIL/uL — ABNORMAL LOW (ref 4.22–5.81)
RDW: 14.5 % (ref 11.5–15.5)
WBC: 6.4 10*3/uL (ref 4.0–10.5)

## 2013-10-31 MED ORDER — CEFUROXIME SODIUM 1.5 G IJ SOLR
1.5000 g | INTRAMUSCULAR | Status: AC
  Filled 2013-10-31 (×2): qty 1.5

## 2013-10-31 MED ORDER — SODIUM CHLORIDE 0.9 % IV SOLN
1020.0000 mg | Freq: Once | INTRAVENOUS | Status: AC
  Administered 2013-10-31: 1020 mg via INTRAVENOUS
  Filled 2013-10-31: qty 34

## 2013-10-31 NOTE — Progress Notes (Signed)
VASCULAR SURGERY:  The patient was agreeable to proceed with vein mapping and upper extremity arterial study today, but is not willing to proceed with placement of new access this admission. Once he is agreeable, we can schedule his surgery.  Lawrence Ferrari, MD, FACS Beeper [phone] 10/31/2013

## 2013-10-31 NOTE — Progress Notes (Signed)
I saw the patient and agree with the above assessment and plan.    Patient is doing well today and is status post renal biopsy yesterday. This was uneventful. This morning he denies gross hematuria, unexpected discomfort, or new issues. Repeat anemia workup demonstrates persistent iron deficiency and will replete iron today with one dose of IV feraheme.  He has failed oral iron therapy.  His renal function is largely stable today. Yesterday I asked Dr. Edilia Bo to see the patient given the trajectory of his GFR decline and the likelihood of needing dialysis in the next 6-12 months.  He is to undergo vein mapping today. He does not necessarily need to stay in the hospital for the surgery and this can be done on an outpatient basis in an expedited manner.  He understands the need for fistula creation as a measure to prepare for dialysis.  Results of the renal biopsy will not be available until tomorrow afternoon at the earliest. I think everything is stable for his discharge today once his outstanding issues are addressed. I have spoken with Dr. Eliott Nine and she will follow him closely in the clinic I will arrange followup.

## 2013-10-31 NOTE — Progress Notes (Signed)
PT Cancellation Note  Patient Details Name: ZAKKARY THIBAULT MRN: [id number] DOB: 19-Oct-1960   Cancelled Treatment:    Reason Eval/Treat Not Completed: Other (comment) (Pt refused ambulation or exercise due to "too tired.")   INGOLD,Rosco Harriott 10/31/2013, 1:03 PM  [facility] [phone] (pager)

## 2013-10-31 NOTE — Progress Notes (Signed)
Family Medicine Teaching Service Daily Progress Note Intern Pager: [phone]  Patient name: Lawrence [name] Medical record number: [id number] Date of birth: Jun 24, 1960 Age: 53 y.o. Gender: male  Primary Care Provider: [name], MD Consultants: Nephrology Code Status: Full  Pt Overview and Major Events to Date:  9/15 - admitted with dyspnea and ARF 9/16 - Started on IV lasix for diuresis 9/18- switched to PO lasix 9/22- Renal biopsy  Assessment and Plan: CHAO BLAZEJEWSKI is a 53 y.o. male presenting with worsened dyspnea and ARF. PMH is significant for chronic cough, CKD, poorly controlled HTN and DM, HLD, neuropathy, and urinary retention.   #AoCKD. Cr 2.78 on admission up from baseline over past 6 months of 1.6-1.7, prior to that 1.2 (11/2012). Labs thus far most consistent with nephrotic syndrome (UPC 8). Most likely diabetic nephropathy. Currently diuresing with PO lasix - Nephrology consulted, appreciate assistance - Continue HTN and DM management - SPEP: Cannot exclude possibility faint restricted band in gamma region - UPEP: Polyclonal increase in free kappa/lambda light chains - s/p renal biopsy 9/22, f/u pathology - Vascular consulted for AVF, will have vein/arterial mapping studies on 9/23  #Dyspnea likely due to volume overload in the setting of AoCKD.  low suspicion for PE (negative LE dopplers, improvement of dyspnea with diuresis and lack of desats on room air). Completed full course abx for CAP. Sating ok on RA - Echo: 50% EF, grade 2 diastolic dysfunction, though limited study due to inability to position patient - Will re-attempt Echo once orthopnea improves, likely outpt  #UTI. UA remarkable for protein, nitrites, and leukocyte, and many bacteria. 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 Normocytic, elevated ferritin, low iron. Likely secondary to CKD, but possibly iron deficiency playing a role.  - PO Fe - Colonoscopy recommendation if not already done at 50.  - Consider transfusion for HgB <7.0, will need to discuss with nephrology risk vs benefits of extra volume in already volume overloaded patient - Holding Aspirin  in setting of anemia   #Poorly controlled HTN normotensive currently - Continue norvasc, metoprolol  BID  - Continue to hold lisinopril in setting of AoCKD.   #DM. On 20U lantus at home. CBGs moderately well controlled here - Lantus 18U daily - sensitive SSI.  - DM educator consulted.   #RLE Wound- Well healing approximately 3cm irregular wound on RLE -bacitracin ointment bid  #HLD  - Continue lipitor  daily.   #Chronic Cough, ear pressure -Continue home flonase, PPI -Consider switching from ACEi to ARB (currently holding) -Add atrovent nasal spray for likely eustachian tube disfunction  FEN/GI: SLIV, Heart Healthy/Carb Modified diet  Prophylaxis: SCDs, no chemoprophylaxis in setting of anemia  Disposition: Possible discharge later today pending vein mapping.   Subjective: NAEON. Tolerated biopsy yesterday. No chest pain.  Objective: Temp:  [98.5 F (36.9 C)-98.7 F (37.1 C)] 98.5 F (36.9 C) (09/23 0500) Pulse Rate:  [76-83] 76 (09/23 0500) Resp:  [20-24] 20 (09/23 0500) BP: (102-148)/(34-75) 119/48 mmHg (09/23 0500) SpO2:  [95 %-100 %] 96 % (09/23 0500) Weight:  [207 lb 7.3 oz (94.1 kg)] 207 lb 7.3 oz (94.1 kg) (09/23 1478) Physical Exam: General: NAD, sitting up in bedside chair Cardiovascular: RRR, no murmurs appreciated Respiratory: nml WOB; CTAB though decreased airflow at bibasilar fields Abdomen: Soft,  nontender, nondistended Extremities: Right BKA, Left leg with 2+ edema to knee, 3-4cm well healing wound in RLE Skin: Warm, dry Neuro: Awake, alert  Laboratory:  Recent  Labs Lab 10/29/13 0230 10/30/13 0547 10/31/13 0518  WBC 5.9 6.5 6.4  HGB 8.0* 8.5* 8.5*  HCT 23.1* 24.9* 25.7*  PLT 148* 181 183    Recent Labs Lab 10/29/13 0230 10/30/13 0547 10/31/13 0518  NA 138 137 139  K 4.3 4.1 4.0  CL 104 101 103  CO2 BUN 65* 68* 71*  CREATININE 4.00* 4.16* 4.22*  CALCIUM 8.6 8.7 8.9  GLUCOSE 183* 214* 191*    Imaging/Diagnostic Tests: CXR (9/18)  Grossly unchanged findings of pulmonary edema, small bilateral effusions and associated bibasilar opacities, left greater than right, atelectasis versus infiltrate. A follow-up chest radiograph in 4 to 6 weeks after treatment is recommended to ensure resolution.   Jacquiline Doe, MD 10/31/2013, 9:54 AM PGY-1, [facility] Family Medicine FPTS Intern pager: [phone], text pages welcome

## 2013-10-31 NOTE — Progress Notes (Signed)
Subjective: Patient seen at bedside this AM, complaining of some nausea overnight. Otherwise no significant complaints. Overwhelmed by decision of permanent access placement.  S/p renal biopsy yesterday.  1.2L UO over past 24 hours. Cr 4.22 this AM.  Iron studies repeated, show Iron of 30, Ferritin 351 and Tsat of 15%.  Seen by vascular surgery, patient to undergo vein mapping and UE arterial study today.  Patient has not agreed to access placement at this time.   Objective: Medications:   Scheduled Medications: . amLODipine  10 mg Oral Daily  . atorvastatin  40 mg Oral Daily  . bacitracin   Topical BID  . brimonidine  1 drop Left Eye BID  . brinzolamide  1 drop Left Eye BID  . calcitRIOL  0.25 mcg Oral Daily  . ferrous sulfate  325 mg Oral BID WC  . fluticasone  2 spray Each Nare Daily  . furosemide  40 mg Oral BID  . insulin aspart  0-9 Units Subcutaneous TID WC  . insulin glargine  18 Units Subcutaneous QHS  . ipratropium  2 spray Each Nare TID  . metoprolol tartrate  25 mg Oral BID  . pantoprazole  80 mg Oral Daily  . sodium chloride  3 mL Intravenous Q12H   PRN Meds:.acetaminophen, HYDROcodone-acetaminophen, oxybutynin, senna, traZODone  BP 119/48  Pulse 76  Temp(Src) 98.5 F (36.9 C) (Oral)  Resp 20  Ht  (1.676 m)  Wt 207 lb 7.3 oz (94.1 kg)  BMI 33.50 kg/m2  SpO2 96%   Intake/Output Summary (Last 24 hours) at 10/31/13 0757 Last data filed at 10/30/13 2253  Gross per 24 hour  Intake    240 ml  Output   1275 ml  Net  -1035 ml    Weight change:  10/31/13 0632 207 lb 7.3 oz (94.1 kg) 10/26/13 0536 209 lb 10.5 oz (95.1 kg) 10/25/13 0653 211 lb 3.2 oz (95.8 kg) 10/24/13 0419 214 lb 15.2 oz (97.5 kg) 10/23/13 1145 223 lb (101.152 kg)      EXAM:  General: Morbidly obese male, alert, cooperative, NAD. HEENT: PERRL, EOMI. Moist mucus membranes.  Neck: Full range of motion without pain, supple, no lymphadenopathy or carotid bruits Lungs: Air entry clear  and equal bilaterally. No wheezes or rales heard on exam today.  Heart: RRR, no murmurs, gallops, or rubs Abdomen: Soft, non-tender, non-distended, BS +. Suprapubic catheter tube present.  Extremities: Right leg BKA w/ prosthesis. LLE still w/ +2 pitting edema.  Neurologic: Alert & oriented X3, cranial nerves II-XII intact, strength grossly intact, sensation intact to light touch   Labs: Basic Metabolic Panel:  Recent Labs Lab 10/29/13 0230 10/30/13 0547 10/31/13 0518  NA 138 137 139  K 4.3 4.1 4.0  CL 104 101 103  CO2 GLUCOSE 183* 214* 191*  BUN 65* 68* 71*  CREATININE 4.00* 4.16* 4.22*  CALCIUM 8.6 8.7 8.9  PHOS 5.6* 5.7* 5.4*    Creatinine, Ser   Date/Time  Value  Ref Range  Status   10/31/2013 4.22 0.50 - 1.35 mg/dL Final   1/61/0960 4.54 0.50 - 1.35 mg/dL Final   0/98/1191 4.78 0.50 - 1.35 mg/dL Final  2/95/6213 0.86 5.78 - 1.35 mg/dL Final   4/69/6295 2.84   Diuretics reduced 0.50 - 1.35 mg/dL Final   1/32/4401 0.27   Diuretics changed po 0.50 - 1.35 mg/dL Final   2/53/6644 0.34 0.50 - 1.35 mg/dL Final   7/42/5956 3:87 AM  3.00*  diuresis initiated  for CHF 0.50 - 1.35 mg/dL  Final   1/61/0960 45:40 PM  2.67*  0.50 - 1.35 mg/dL  Final   9/81/19  1.47  Lisinopril discontinued  0.50 - 1.35 mg/dL Final   09/09/93  6.21  3.08 - 1.35 mg/dL Final   07/13/76  4.69  6.29 - 1.35 mg/dL Final   07/06/39  3.24 Lisinopril restarted  0.50 - 1.35 mg/dL Final   05/10/270 5:36 AM  1.63*  0.50 - 1.35 mg/dL  Final   64/40/3474 2:59 AM  1.23  0.50 - 1.35 mg/dL  Final    Liver Function Tests:  Recent Labs Lab 10/29/13 0230 10/30/13 0547 10/31/13 0518  ALBUMIN 2.3* 2.3* 2.5*   CBC:  Recent Labs Lab 10/27/13 0358 10/28/13 0437 10/29/13 0230 10/30/13 0547 10/31/13 0518  WBC 6.4 6.0 5.9 6.5 6.4  HGB 7.7* 7.0* 8.0* 8.5* 8.5*  HCT 22.9* 20.5* 23.1* 24.9* 25.7*  MCV 80.4 82.3 80.5 81.1 83.4  PLT 170 149* 148* 181 183    CBG:  Recent Labs Lab 10/30/13 0633  10/30/13 1127 10/30/13 1620 10/30/13 2126 10/31/13 0618  GLUCAP 216* 182* 92 218* 177*    Assessment/Plan: 53 y.o. male w/ PMHx of HTN, HLD, DM type II, s/p right BKA, chronic suprapubic catheter, and CKD stage 3, admitted w/ worsening SOB and acute on chronic renal failure w/ nephrotic range proteinuria.   Acute on Chronic CKD- CKD stage 3 and nephrotic syndrome most likely associated w/ poorly controlled HTN and DM type II. On Lasix 40 mg bid currently. C3, C4 levels wnl, ANCA, Hep B, Hep C, ANA, all negative. Renal biopsy performed yesterday, pathology pending. Will go for vein mapping and UE arterial study today per [facility]. Patient has not yet agreed to access placement at this time. Cr 4.22 this AM, relatively stable from yesterday. Continue Lasix 40 mg bid dosing, stable for discharge today w/ close follow up. Will set up appointment w/ [facility].  Cough- Improved. Felt that this was largely contributed to by possible underlying new onset CHF in combination w/ preceding respiratory illness and nephrotic syndrome. ECHO from 9/16 shows EF of 50% w/ grade 2 diastolic dysfunction. Lasix as above. History of urinary retention w/ suprapubic catheter- Urinating normally, suprapubic catheter plugged. No urinary symptoms. Urine Culture on admission w/ >100,000 colonies serratia fonticola. Received 1 dose Levaquin. Patient to follow up w/ Dr. Brunilda Payor on discharge for removal of suprapubic catheter.  Secondary Hyperparathyroidism- Previous PTH in 05/2012 was 322. Started on Zemplar by Dr. Briant Cedar in 06/2012, but it is unclear if patient has been taking this (not on medication list either). Noted to have elevated phos of 4.7 on admission.  Repeat PTH 243 on 9/16. Continue po calcitriol. Anemia- Thought to be 2/2 iron deficiency in combination w/ CKD, started on Ferrous sulfate 325 bid on admission. S/p 1 unit PRBC's 10/28/13. Hb 8.5 this AM. Repeat iron studies show  Iron of 30, Ferritin 351 and Tsat of  15%. Will give Feraheme IV for iron load this AM.  HTN- BP well controlled. Currently on Norvasc 10 mg qd + Lopressor 25 mg bid. Continue strict BP control.  DM type II- Most recent HbA1c 7.5. Continue current insulin regimen.    Signed: Lars Masson, MD 10/31/2013 7:57 AM

## 2013-10-31 NOTE — Progress Notes (Signed)
FMTS Attending Note Patient seen and examined by me, discussed with resident team and I agree with Dr Lavone Neri note for today. I had an extensive discussion with Lawrence [name] regarding permanent access.  He now agrees with the plan to establish access before being discharged.  To continue to follow renal function, fluid balance.  [name], MD

## 2013-11-01 ENCOUNTER — Encounter (HOSPITAL_COMMUNITY)
Admission: AD | Disposition: A | Discharge status: Home Health | Payer: Self-pay | Source: Ambulatory Visit | Attending: Family Medicine

## 2013-11-01 ENCOUNTER — Other Ambulatory Visit: Payer: Self-pay | Admitting: *Deleted

## 2013-11-01 ENCOUNTER — Inpatient Hospital Stay (HOSPITAL_COMMUNITY): Payer: Medicaid Other | Admitting: Anesthesiology

## 2013-11-01 ENCOUNTER — Encounter (HOSPITAL_COMMUNITY): Payer: Medicaid Other | Admitting: Anesthesiology

## 2013-11-01 ENCOUNTER — Other Ambulatory Visit: Payer: Self-pay | Admitting: Family Medicine

## 2013-11-01 DIAGNOSIS — N19 Unspecified kidney failure: Secondary | ICD-10-CM

## 2013-11-01 DIAGNOSIS — Z4931 Encounter for adequacy testing for hemodialysis: Secondary | ICD-10-CM

## 2013-11-01 DIAGNOSIS — N186 End stage renal disease: Secondary | ICD-10-CM

## 2013-11-01 DIAGNOSIS — N189 Chronic kidney disease, unspecified: Secondary | ICD-10-CM

## 2013-11-01 DIAGNOSIS — Z01810 Encounter for preprocedural cardiovascular examination: Secondary | ICD-10-CM

## 2013-11-01 HISTORY — PX: AV FISTULA PLACEMENT: SHX1204

## 2013-11-01 LAB — RENAL FUNCTION PANEL
Albumin: 2.3 g/dL — ABNORMAL LOW (ref 3.5–5.2)
Anion gap: 16 — ABNORMAL HIGH (ref 5–15)
BUN: 68 mg/dL — ABNORMAL HIGH (ref 6–23)
CALCIUM: 9 mg/dL (ref 8.4–10.5)
CO2: 20 mEq/L (ref 19–32)
CREATININE: 3.89 mg/dL — AB (ref 0.50–1.35)
Chloride: 102 mEq/L (ref 96–112)
GFR, EST AFRICAN AMERICAN: 19 mL/min — AB (ref >90)
GFR, EST NON AFRICAN AMERICAN: 16 mL/min — AB (ref >90)
Glucose, Bld: 202 mg/dL — ABNORMAL HIGH (ref 70–99)
PHOSPHORUS: 5.5 mg/dL — AB (ref 2.3–4.6)
Potassium: 4 mEq/L (ref 3.7–5.3)
Sodium: 138 mEq/L (ref 137–147)

## 2013-11-01 LAB — GLUCOSE, CAPILLARY
GLUCOSE-CAPILLARY: 171 mg/dL — AB (ref 70–99)
GLUCOSE-CAPILLARY: 105 mg/dL — AB (ref 70–99)
Glucose-Capillary: 138 mg/dL — ABNORMAL HIGH (ref 70–99)
Glucose-Capillary: 91 mg/dL (ref 70–99)

## 2013-11-01 LAB — PROTIME-INR
INR: 1.18 (ref 0.00–1.49)
Prothrombin Time: 15 seconds (ref 11.6–15.2)

## 2013-11-01 LAB — CBC
HEMATOCRIT: 25.4 % — AB (ref 39.0–52.0)
Hemoglobin: 8.7 g/dL — ABNORMAL LOW (ref 13.0–17.0)
MCH: 28 pg (ref 26.0–34.0)
MCHC: 34.3 g/dL (ref 30.0–36.0)
MCV: 81.7 fL (ref 78.0–100.0)
PLATELETS: 193 10*3/uL (ref 150–400)
RBC: 3.11 MIL/uL — ABNORMAL LOW (ref 4.22–5.81)
RDW: 14.5 % (ref 11.5–15.5)
WBC: 7.4 10*3/uL (ref 4.0–10.5)

## 2013-11-01 SURGERY — ARTERIOVENOUS (AV) FISTULA CREATION
Anesthesia: Monitor Anesthesia Care | Site: Arm Upper | Laterality: Right

## 2013-11-01 MED ORDER — PROPOFOL INFUSION 10 MG/ML OPTIME
INTRAVENOUS | Status: DC | PRN
Start: 2013-11-01 — End: 2013-11-01
  Administered 2013-11-01: 25 ug/kg/min via INTRAVENOUS

## 2013-11-01 MED ORDER — HEPARIN SODIUM (PORCINE) 1000 UNIT/ML IJ SOLN
INTRAMUSCULAR | Status: AC
  Filled 2013-11-01: qty 1

## 2013-11-01 MED ORDER — CEFAZOLIN SODIUM-DEXTROSE 2-3 GM-% IV SOLR
INTRAVENOUS | Status: AC
  Filled 2013-11-01: qty 50

## 2013-11-01 MED ORDER — PROTAMINE SULFATE 10 MG/ML IV SOLN
INTRAVENOUS | Status: AC
  Filled 2013-11-01: qty 5

## 2013-11-01 MED ORDER — PROTAMINE SULFATE 10 MG/ML IV SOLN
INTRAVENOUS | Status: DC | PRN
  Administered 2013-11-01 (×4): 10 mg via INTRAVENOUS

## 2013-11-01 MED ORDER — 0.9 % SODIUM CHLORIDE (POUR BTL) OPTIME
TOPICAL | Status: DC | PRN
  Administered 2013-11-01: 1000 mL

## 2013-11-01 MED ORDER — FENTANYL CITRATE 0.05 MG/ML IJ SOLN
INTRAMUSCULAR | Status: DC | PRN
Start: 2013-11-01 — End: 2013-11-01
  Administered 2013-11-01: 50 ug via INTRAVENOUS

## 2013-11-01 MED ORDER — SODIUM CHLORIDE 0.9 % IR SOLN
Status: DC | PRN
  Administered 2013-11-01: 15:00:00

## 2013-11-01 MED ORDER — CEFAZOLIN SODIUM-DEXTROSE 2-3 GM-% IV SOLR
INTRAVENOUS | Status: DC | PRN
  Administered 2013-11-01: 2 g via INTRAVENOUS

## 2013-11-01 MED ORDER — OXYCODONE-ACETAMINOPHEN 5-325 MG PO TABS: 1.0000 | ORAL_TABLET | ORAL | Status: DC | PRN

## 2013-11-01 MED ORDER — INSULIN GLARGINE 100 UNIT/ML ~~LOC~~ SOLN
20.0000 [IU] | Freq: Every day | SUBCUTANEOUS | Status: DC
  Filled 2013-11-01 (×3): qty 0.2

## 2013-11-01 MED ORDER — CALCITRIOL 0.25 MCG PO CAPS: 0.2500 ug | ORAL_CAPSULE | Freq: Every day | ORAL | Status: AC

## 2013-11-01 MED ORDER — ONDANSETRON HCL 4 MG/2ML IJ SOLN: 4.0000 mg | Freq: Three times a day (TID) | INTRAMUSCULAR | Status: DC | PRN

## 2013-11-01 MED ORDER — HYDROMORPHONE HCL 1 MG/ML IJ SOLN: 0.2500 mg | INTRAMUSCULAR | Status: DC | PRN

## 2013-11-01 MED ORDER — HEPARIN SODIUM (PORCINE) 1000 UNIT/ML IJ SOLN
INTRAMUSCULAR | Status: DC | PRN
  Administered 2013-11-01: 7000 [IU] via INTRAVENOUS

## 2013-11-01 MED ORDER — MIDAZOLAM HCL 5 MG/5ML IJ SOLN
INTRAMUSCULAR | Status: DC | PRN
  Administered 2013-11-01 (×2): 1 mg via INTRAVENOUS

## 2013-11-01 MED ORDER — SODIUM CHLORIDE 0.9 % IV SOLN
INTRAVENOUS | Status: DC | PRN
  Administered 2013-11-01: 15:00:00 via INTRAVENOUS

## 2013-11-01 MED ORDER — LIDOCAINE HCL (PF) 1 % IJ SOLN
INTRAMUSCULAR | Status: AC
  Filled 2013-11-01: qty 30

## 2013-11-01 MED ORDER — FENTANYL CITRATE 0.05 MG/ML IJ SOLN
INTRAMUSCULAR | Status: AC
  Filled 2013-11-01: qty 5

## 2013-11-01 MED ORDER — MIDAZOLAM HCL 2 MG/2ML IJ SOLN
INTRAMUSCULAR | Status: AC
  Filled 2013-11-01: qty 2

## 2013-11-01 MED ORDER — FUROSEMIDE 40 MG PO TABS: 40.0000 mg | ORAL_TABLET | Freq: Two times a day (BID) | ORAL | Status: DC

## 2013-11-01 MED ORDER — SODIUM CHLORIDE 0.9 % IV SOLN
INTRAVENOUS | Status: DC
  Administered 2013-11-01: 14:00:00 via INTRAVENOUS

## 2013-11-01 MED ORDER — LIDOCAINE HCL (PF) 1 % IJ SOLN
INTRAMUSCULAR | Status: DC | PRN
  Administered 2013-11-01: 11 mL

## 2013-11-01 SURGICAL SUPPLY — 42 items
ADH SKN CLS APL DERMABOND .7 (GAUZE/BANDAGES/DRESSINGS) ×1
ARMBAND PINK RESTRICT EXTREMIT (MISCELLANEOUS) ×3 IMPLANT
BLADE SURG 10 STRL SS (BLADE) ×3 IMPLANT
CANISTER SUCTION 2500CC (MISCELLANEOUS) ×3 IMPLANT
CANNULA VESSEL 3MM 2 BLNT TIP (CANNULA) ×3 IMPLANT
CLIP TI MEDIUM 6 (CLIP) ×3 IMPLANT
CLIP TI WIDE RED SMALL 6 (CLIP) ×3 IMPLANT
COVER PROBE W GEL 5X96 (DRAPES) IMPLANT
COVER SURGICAL LIGHT HANDLE (MISCELLANEOUS) ×3 IMPLANT
DECANTER SPIKE VIAL GLASS SM (MISCELLANEOUS) ×3 IMPLANT
DERMABOND ADVANCED (GAUZE/BANDAGES/DRESSINGS) ×2
DERMABOND ADVANCED .7 DNX12 (GAUZE/BANDAGES/DRESSINGS) ×1 IMPLANT
DRAIN PENROSE 1/2X12 LTX STRL (WOUND CARE) IMPLANT
ELECT REM PT RETURN 9FT ADLT (ELECTROSURGICAL) ×3
ELECTRODE REM PT RTRN 9FT ADLT (ELECTROSURGICAL) ×1 IMPLANT
GLOVE BIO SURGEON STRL SZ 6.5 (GLOVE) ×4 IMPLANT
GLOVE BIO SURGEON STRL SZ7.5 (GLOVE) ×3 IMPLANT
GLOVE BIO SURGEONS STRL SZ 6.5 (GLOVE) ×2
GLOVE BIOGEL PI IND STRL 6.5 (GLOVE) ×3 IMPLANT
GLOVE BIOGEL PI IND STRL 7.0 (GLOVE) ×2 IMPLANT
GLOVE BIOGEL PI IND STRL 8 (GLOVE) ×1 IMPLANT
GLOVE BIOGEL PI INDICATOR 6.5 (GLOVE) ×6
GLOVE BIOGEL PI INDICATOR 7.0 (GLOVE) ×4
GLOVE BIOGEL PI INDICATOR 8 (GLOVE) ×2
GLOVE SURG SS PI 7.0 STRL IVOR (GLOVE) ×6 IMPLANT
GOWN STRL REUS W/ TWL LRG LVL3 (GOWN DISPOSABLE) ×3 IMPLANT
GOWN STRL REUS W/ TWL XL LVL3 (GOWN DISPOSABLE) ×1 IMPLANT
GOWN STRL REUS W/TWL LRG LVL3 (GOWN DISPOSABLE) ×9
GOWN STRL REUS W/TWL XL LVL3 (GOWN DISPOSABLE) ×3
KIT BASIN OR (CUSTOM PROCEDURE TRAY) ×3 IMPLANT
KIT ROOM TURNOVER OR (KITS) ×3 IMPLANT
NS IRRIG 1000ML POUR BTL (IV SOLUTION) ×3 IMPLANT
PACK CV ACCESS (CUSTOM PROCEDURE TRAY) ×3 IMPLANT
PAD ARMBOARD 7.5X6 YLW CONV (MISCELLANEOUS) ×6 IMPLANT
SPONGE SURGIFOAM ABS GEL 100 (HEMOSTASIS) IMPLANT
SUT PROLENE 6 0 BV (SUTURE) ×6 IMPLANT
SUT VIC AB 3-0 SH 27 (SUTURE) ×3
SUT VIC AB 3-0 SH 27X BRD (SUTURE) ×1 IMPLANT
SUT VICRYL 4-0 PS2 18IN ABS (SUTURE) ×3 IMPLANT
TOWEL NATURAL 6PK STERILE (DISPOSABLE) ×3 IMPLANT
UNDERPAD 30X30 INCONTINENT (UNDERPADS AND DIAPERS) ×3 IMPLANT
WATER STERILE IRR 1000ML POUR (IV SOLUTION) ×3 IMPLANT

## 2013-11-01 NOTE — Progress Notes (Signed)
FMTS Attending Note Patient seen and examined by me, discussed with resident team and I agree with Dr. Lavone Neri note for today. Patient for vascular access placement today, then discharge. Close renal labs follow up and Nephrology outpatient follow up as well. [name], MD

## 2013-11-01 NOTE — Progress Notes (Signed)
  [facility] Progress Note  11/01/2013 9:59 AM  Patient agreed to have surgery for access placement yesterday. He will have a right arm fistula vs graft today with Dr. Edilia Bo.    Maris Berger, PA-C [facility] Office: [phone] Pager: [phone] 11/01/2013 9:59 AM

## 2013-11-01 NOTE — Transfer of Care (Signed)
Immediate Anesthesia Transfer of Care Note  Patient: Lawrence [name]  Procedure(s) Performed: Procedure(s): RIGHT ARM ARTERIOVENOUS (AV) FISTULA CREATION (Right)  Patient Location: PACU  Anesthesia Type:MAC  Level of Consciousness: awake and alert   Airway & Oxygen Therapy: Patient Spontanous Breathing and Patient connected to nasal cannula oxygen  Post-op Assessment: Report given to PACU RN, Post -op Vital signs reviewed and stable and Patient moving all extremities  Post vital signs: Reviewed and stable  Complications: No apparent anesthesia complications

## 2013-11-01 NOTE — H&P (View-Only) (Signed)
Vascular and Vein [facility] Consult  Reason for Consult:  Permanent access Referring Physician:  Marisue Humble MRN #:  [id number]  History of Present Illness: This is a 53 y.o. male who admitted on 10/23/13 for worsening dyspnea and cough.  He has a past medical history of chronic kidney disease stage 3 with poorly controlled hypertension and insulin dependent diabetes mellitus II, s/p right BKA in 2014 and chronic suprapubic catheter (02/2013 by Dr. Brunilda Payor). On this admission, he was found to have worsening renal function. His baseline creatinine is 1.2. We have been consulted for permanent dialysis access placement.   The patient is left hand dominant. The patient has not had previous access procedures. He is not yet on dialysis.  He currently denies any chest pain or shortness of breath. He notes a productive cough.    He has hypertension not well managed with three antihypertensive agents. He has diabetes not well managed with insulin. He has hyperlipidemia managed with a statin. He takes a daily aspirin.  Past Medical History  Diagnosis Date  . Hypertension   . Chronic kidney disease   . Hypercholesteremia   . PONV (postoperative nausea and vomiting)   . Peripheral vascular disease   . H/O hiatal hernia     hx of years ago   . Pneumonia 10/23/2013  . IDDM (insulin dependent diabetes mellitus)   . Depression 08/2012    "just when foot first got cut off"   Past Surgical History  Procedure Laterality Date  . Amputation Right 05/03/2012    Procedure: Right [place] AMPUTATION;  Surgeon: Eldred Manges, MD;  Location: [facility] OR;  Service: Orthopedics;  Laterality: Right;  . I&d extremity Right 05/29/2012    Procedure: IRRIGATION AND DEBRIDEMENT EXTREMITY- right foot;  Surgeon: Eldred Manges, MD;  Location: [facility] OR;  Service: Orthopedics;  Laterality: Right;  Right Foot Debridement, VAC Application  . Amputation Right 08/16/2012    Procedure: AMPUTATION BELOW KNEE;  Surgeon: Eldred Manges, MD;   Location: [facility] OR;  Service: Orthopedics;  Laterality: Right;  Right Below Knee Amputation  . Amputation Right 10/04/2012    Procedure: AMPUTATION BELOW KNEE REVISION;  Surgeon: Eldred Manges, MD;  Location: [facility] OR;  Service: Orthopedics;  Laterality: Right;  . Insertion of suprapubic catheter N/A 03/02/2013    Procedure: INSERTION OF SUPRAPUBIC CATHETER;  Surgeon: Su Grand, MD;  Location: [facility] ORS;  Service: Urology;  Laterality: N/A;  . Tonsillectomy  ~ 1967   History   Social History  . Marital Status: Single    Spouse Name: N/A    Number of Children: N/A  . Years of Education: N/A   Occupational History  . Not on file.   Social History Main Topics  . Smoking status: Never Smoker   . Smokeless tobacco: Never Used  . Alcohol Use: Yes     Comment: 10/23/2013 "couple times/month I'll have a beer"  . Drug Use: No  . Sexual Activity: Not Currently   Other Topics Concern  . Not on file   Social History Narrative  . No narrative on file   He has never been a smoker.  History reviewed. No pertinent family history.  No family history coronary artery disease.  No current facility-administered medications on file prior to encounter.   Current Outpatient Prescriptions on File Prior to Encounter  Medication Sig Dispense Refill  . amLODipine (NORVASC) 10 MG tablet Take 1 tablet (10 mg total) by mouth daily.  90 tablet  3  .  aspirin 81 MG tablet Take 81 mg by mouth at bedtime.       Marland Kitchen atorvastatin (LIPITOR) 40 MG tablet Take 1 tablet (40 mg total) by mouth daily.  30 tablet  6  . fluticasone (FLONASE) 50 MCG/ACT nasal spray Place 2 sprays into both nostrils daily.  16 g  1  . insulin glargine (LANTUS) 100 UNIT/ML injection Inject 20 Units into the skin at bedtime.      Marland Kitchen lisinopril (PRINIVIL,ZESTRIL) 20 MG tablet Take 1 tablet (20 mg total) by mouth daily.  90 tablet  4  . metoprolol tartrate (LOPRESSOR) 25 MG tablet Take 1 tablet (25 mg total) by mouth 2 (two) times daily.  180 tablet  4    . montelukast (SINGULAIR) 10 MG tablet Take 1 tablet (10 mg total) by mouth at bedtime.  30 tablet  3  . mupirocin ointment (BACTROBAN) 2 % Apply 1 application topically 2 (two) times daily. Apply to affecte  15 g  0  . omeprazole (PRILOSEC) 40 MG capsule Take 1 capsule (40 mg total) by mouth daily.  30 capsule  3  . oxybutynin (DITROPAN) 5 MG tablet Take 1 tablet (5 mg total) by mouth every 8 (eight) hours as needed for bladder spasms.  30 tablet  0   No Known Allergies  REVIEW OF SYSTEMS:  (Positives checked otherwise negative)  CARDIOVASCULAR:   chest pain,  chest pressure,  palpitations,  shortness of breath when laying flat,  shortness of breath with exertion,   pain in feet when walking,  pain in feet when laying flat,  history of blood clot in veins (DVT),  history of phlebitis,  swelling in legs,  varicose veins  PULMONARY:   productive cough,  asthma,  wheezing  NEUROLOGIC:   weakness in arms or legs,  numbness in arms or legs,  difficulty speaking or slurred speech,  temporary loss of vision in one eye,  dizziness  HEMATOLOGIC:   bleeding problems,  problems with blood clotting too easily  MUSCULOSKEL:   joint pain,  joint swelling  GASTROINTEST:   vomiting blood,  blood in stool     GENITOURINARY:   burning with urination,  blood in urine  PSYCHIATRIC:   history of major depression  INTEGUMENTARY:   rashes,    CONSTITUTIONAL:   fever,  chills  Physical Examination  Filed Vitals:   10/30/13 1036 10/30/13 1047 10/30/13 1052 10/30/13 1057  BP: 148/61 141/62 140/59 135/75  Pulse: 79 83 81 80  Temp:      TempSrc:      Resp: Height:      Weight:      SpO2: 99% 100% 98% 96%   Body mass index is 33.86 kg/(m^2).  General: A&O x 3, Morbidly obese male resting in bed in NAD  Head: [place]/AT  Eyes: EOMI, mild scleral icterus  Neck: Supple, obese  Pulmonary: Sym exp, good air movt, CTAB, no  rales, rhonchi, & wheezing  Cardiac: RRR, Nl S1, S2, no Murmurs, rubs or gallops, left carotid bruit  Vascular: Vessel Right Left  Radial Palpable Palpable  Ulnar Palpable Palpable  Aorta Not palpable N/A  Femoral Palpable Not palpable  Popliteal Not palpable Not palpable  PT BKA Not palpable  DP BKA Faintly palpable 1+   Gastrointestinal: soft, NTND, obese, - HSM, - masses, suprapubic catheter in place  Musculoskeletal: M/S 5/5 throughout. Extremities without ischemic changes.   Neurologic: CN 2-12 grossly intact. Pain and light touch  intact in extremities.   Psychiatric: Judgment intact, Mood & affect appropriate for pt's clinical situation, history of depression  Dermatologic: See M/S exam for extremity exam, no rashes otherwise noted  Medical Decision Making  Lawrence [name] is a 53 y.o. male who presents with acute on chronic kidney disease (CKD stage 3) not yet on dialysis. He is status post renal biopsy today.   Vein mapping has been ordered. The patient is left handed.   Had an extensive discussion with this patient in regards to the nature of access surgery, including risk, benefits, and alternatives.  At this time, he is not interested in having surgery and would like to have more time to think about it. He does say that he is open to having dialysis if it is medically necessary in the future. Advised him to follow up as an outpatient to discuss access placement. Dr. Edilia Bo to see patient.   Maris Berger, PA-C [facility] Office: [phone] Pager: [phone]  10/30/2013, 11:02 AM  Agree with above. Currently the patient tells me he is not agreeable to proceed with hemodialysis access. He does tell me that he is agreeable to complete his workup. His vein map has been ordered. I have also ordered an upper extremity arterial Doppler study. He had a kidney biopsy today and request that these studies be done tomorrow.  Waverly Ferrari, MD, FACS Beeper [phone] 10/30/2013

## 2013-11-01 NOTE — Progress Notes (Signed)
VASCULAR LAB PRELIMINARY RESULTS  Upper Extremity Arterial completed    RIGHT   LEFT    PRESSURE WAVEFORM  PRESSURE WAVEFORM  SUBCLAVIAN NA  SUBCLAVIAN NA   AXILLARY NA  AXILLARY NA   BRACHIAL 152  triphasic BRACHIAL 156 triphasic  RADIAL IV triphasic RADIAL 171 triphasic  ULNAR IV triphasic ULNAR 141 triphasic    RIGHT LEFT  PALMAR ARCH WNL Not done   Right  Upper Extremity Vein Map    Cephalic  Segment Diameter Depth Comment  1. Axilla 1.6 mm mm   2. Mid upper arm 2.4 mm mm   3. Above AC 2.2 mm mm   4. In AC 4.6 mm mm branch  5. Below AC 2.6 mm mm   6. Mid forearm 1.6 mm mm   7. Wrist 2 mm mm Above IV   mm mm    mm mm    mm mm    Basilic  Segment Diameter Depth Comment  1. Mid upper arm 2.5 mm 9.1 mm   2. Above AC 1.1 mm 3.7 mm   3. In AC mm mm Too small to measure  4. Below AC mm mm Too small to measure   mm mm    mm mm    mm mm    mm mm    mm mm    mm mm      Left Upper Extremity Vein Map    Cephalic  Segment Diameter Depth Comment  1. At shoulder 2.7 mm mm   2. Axilla 3.3 mm mm   3. Mid upper arm 2.1 mm mm   4. Above AC 2.9 mm mm   5. In AC 3.7 mm mm branch  6. Below AC 1.7 mm mm   7. Mid forearm 1.5 mm mm branch  8. At wrist 2.6 mm mm    mm mm    mm mm    Basilic  Segment Diameter Depth Comment       1. Mid upper arm 1.9 mm 1.1 mm   2. Above AC 1.6 mm 3.5 mm   3. In AC 1.7 mm 2.1 mm   4. Below AC mm mm Too small to measure    mm mm    mm mm    mm mm    mm mm    mm mm     Shanley Furlough, RVT 11/01/2013, 1:18 PM

## 2013-11-01 NOTE — Progress Notes (Signed)
Pt refused to have IV  Changed.  He states that he will be going home tomorrow, and that it hurts to bad. Encouraged pt to let us stick him, and we would use a numbing shot, but pt continues to refuse.

## 2013-11-01 NOTE — Anesthesia Postprocedure Evaluation (Signed)
  Anesthesia Post-op Note  Patient: Earmon Sherrow Lindenbaum  Procedure(s) Performed: Procedure(s): RIGHT ARM ARTERIOVENOUS (AV) FISTULA CREATION (Right)  Patient Location: PACU  Anesthesia Type:MAC  Level of Consciousness: awake  Airway and Oxygen Therapy: Patient Spontanous Breathing  Post-op Pain: mild  Post-op Assessment: Post-op Vital signs reviewed  Post-op Vital Signs: Reviewed  Last Vitals:  Filed Vitals:   11/01/13 1250  BP: 138/58  Pulse:   Temp:   Resp:     Complications: No apparent anesthesia complications

## 2013-11-01 NOTE — Progress Notes (Signed)
VASCULAR SURGERY:  Vein map reviewed. He does not appear to have adequate vein in either arm for a fistula. However, I have recommended that we explore his veins in the right arm with duplex and if any appear adequately could potentially place an AV fistula. More likely he will require placement of an AV graft. He is now agreeable to surgery.  Waverly Ferrari, MD, FACS Beeper [phone] 11/01/2013

## 2013-11-01 NOTE — Progress Notes (Signed)
Subjective: Patient seen at bedside this AM, complains of nausea.  Patient has agreed to have permament access placed prior to discharge, planned for today. Discussed w/ vascular PA.  Cr improved this AM, 3.89.  700 cc urine output overnight.   Objective: Medications:   Scheduled Medications: . amLODipine  10 mg Oral Daily  . atorvastatin  40 mg Oral Daily  . bacitracin   Topical BID  . brimonidine  1 drop Left Eye BID  . brinzolamide  1 drop Left Eye BID  . calcitRIOL  0.25 mcg Oral Daily  . cefUROXime (ZINACEF)  IV  1.5 g Intravenous On Call to OR  . fluticasone  2 spray Each Nare Daily  . furosemide  40 mg Oral BID  . insulin aspart  0-9 Units Subcutaneous TID WC  . insulin glargine  18 Units Subcutaneous QHS  . ipratropium  2 spray Each Nare TID  . metoprolol tartrate  25 mg Oral BID  . pantoprazole  80 mg Oral Daily  . sodium chloride  3 mL Intravenous Q12H   PRN Meds:.acetaminophen, HYDROcodone-acetaminophen, oxybutynin, senna, traZODone  BP 137/57  Pulse 79  Temp(Src) 99.2 F (37.3 C) (Oral)  Resp 20  Ht  (1.676 m)  Wt 207 lb 7.3 oz (94.1 kg)  BMI 33.50 kg/m2  SpO2 96%   Intake/Output Summary (Last 24 hours) at 11/01/13 0705 Last data filed at 10/31/13 1700  Gross per 24 hour  Intake    600 ml  Output    700 ml  Net   -100 ml    Weight change:  10/31/13 0632 207 lb 7.3 oz (94.1 kg) 10/26/13 0536 209 lb 10.5 oz (95.1 kg) 10/25/13 0653 211 lb 3.2 oz (95.8 kg) 10/24/13 0419 214 lb 15.2 oz (97.5 kg) 10/23/13 1145 223 lb (101.152 kg)      EXAM:  General: Morbidly obese [name], alert, cooperative, NAD. HEENT: PERRL, EOMI. Moist mucus membranes.  Neck: Full range of motion without pain, supple, no lymphadenopathy or carotid bruits Lungs: Air entry clear and equal bilaterally. No wheezes or rales heard on exam today.  Heart: RRR, no murmurs, gallops, or rubs Abdomen: Soft, non-tender, non-distended, BS +. Suprapubic catheter tube present.  Extremities:  Right leg BKA w/ prosthesis. LLE still w/ +2 pitting edema.  Neurologic: Alert & oriented X3, cranial nerves II-XII intact, strength grossly intact, sensation intact to light touch   Labs: Basic Metabolic Panel:  Recent Labs Lab 10/30/13 0547 10/31/13 0518 11/01/13 0456  NA 137 139 138  K 4.1 4.0 4.0  CL 101 103 102  CO2 GLUCOSE 214* 191* 202*  BUN 68* 71* 68*  CREATININE 4.16* 4.22* 3.89*  CALCIUM 8.7 8.9 9.0  PHOS 5.7* 5.4* 5.5*    Creatinine, Ser   Date/Time  Value  Ref Range  Status   11/01/2013 3.89 0.50 - 1.35 mg/dL Final  1/61/0960 4.54 0.98 - 1.35 mg/dL Final   02/27/1476 2.95 0.50 - 1.35 mg/dL Final   07/29/3084 5.78 0.50 - 1.35 mg/dL Final  4/69/6295 2.84 1.32 - 1.35 mg/dL Final   4/40/1027 2.53   Diuretics reduced 0.50 - 1.35 mg/dL Final   6/64/4034 7.42   Diuretics changed po 0.50 - 1.35 mg/dL Final   5/95/6387 5.64 0.50 - 1.35 mg/dL Final   3/32/9518 8:41 AM  3.00*  diuresis initiated for CHF 0.50 - 1.35 mg/dL  Final   6/60/6301 60:10 PM  2.67*  0.50 - 1.35 mg/dL  Final  10/19/13  2.78  Lisinopril discontinued  0.50 - 1.35 mg/dL Final   02/17/12  7.82  9.56 - 1.35 mg/dL Final   03/11/28  8.65  7.84 - 1.35 mg/dL Final   6/96/29  5.28 Lisinopril restarted  0.50 - 1.35 mg/dL Final   05/22/2438 1:02 AM  1.63*  0.50 - 1.35 mg/dL  Final   72/53/6644 0:34 AM  1.23  0.50 - 1.35 mg/dL  Final    Liver Function Tests:  Recent Labs Lab 10/30/13 0547 10/31/13 0518 11/01/13 0456  ALBUMIN 2.3* 2.5* 2.3*   CBC:  Recent Labs Lab 10/28/13 0437 10/29/13 0230 10/30/13 0547 10/31/13 0518 11/01/13 0456  WBC 6.0 5.9 6.5 6.4 7.4  HGB 7.0* 8.0* 8.5* 8.5* 8.7*  HCT 20.5* 23.1* 24.9* 25.7* 25.4*  MCV 82.3 80.5 81.1 83.4 81.7  PLT 149* 148* 181 183 193    CBG:  Recent Labs Lab 10/31/13 0618 10/31/13 1130 10/31/13 1609 10/31/13 2132 11/01/13 0624  GLUCAP 177* 254* 170* 237* 171*    Assessment/Plan: Lawrence [name] w/ PMHx of HTN, HLD, DM type II, s/p  right BKA, chronic suprapubic catheter, and CKD stage 3, admitted w/ worsening SOB and acute on chronic renal failure w/ nephrotic range proteinuria.   Acute on Chronic CKD- CKD stage 3 and nephrotic syndrome most likely associated w/ poorly controlled HTN and DM type II. On Lasix 40 mg bid currently. C3, C4 levels wnl, ANCA, Hep B, Hep C, ANA, all negative. Renal biopsy performed 10/30/13, pathology pending. Patient has agreed to stay for permanent access prior to discharge, planned for today. Discussed w/ vascular PA, AVF will be placed by Dr. Edilia Bo.  Cough- Improved. Felt that this was largely contributed to by possible underlying new onset CHF in combination w/ preceding respiratory illness and nephrotic syndrome. ECHO from 9/16 shows EF of 50% w/ grade 2 diastolic dysfunction. Lasix as above. History of urinary retention w/ suprapubic catheter- Urinating normally, suprapubic catheter plugged. No urinary symptoms. Urine Culture on admission w/ >100,000 colonies serratia fonticola. Received 1 dose Levaquin. Patient to follow up w/ Dr. Brunilda Payor on discharge for removal of suprapubic catheter.  Secondary Hyperparathyroidism- Previous PTH in 05/2012 was 322. Started on Zemplar by Dr. Briant Cedar in 06/2012, but it is unclear if patient has been taking this (not on medication list either). Noted to have elevated phos of 4.7 on admission.  Repeat PTH 243 on 9/16. Continue po calcitriol. Anemia- Thought to be 2/2 iron deficiency in combination w/ CKD, started on Ferrous sulfate 325 bid on admission. S/p 1 unit PRBC's 10/28/13. Hb 8.5 this AM. Repeat iron studies show  Iron of 30, Ferritin 351 and Tsat of 15%. Given Feraheme IV for iron load yesterday. HTN- BP well controlled. Currently on Norvasc 10 mg qd + Lopressor 25 mg bid. Continue strict BP control.  DM type II- Most recent HbA1c 7.5. Continue current insulin regimen.    Signed: Lars Masson, MD 11/01/2013 7:05 AM

## 2013-11-01 NOTE — Anesthesia Preprocedure Evaluation (Addendum)
Anesthesia Evaluation  Patient identified by MRN, date of birth, ID band Patient awake    Reviewed: Allergy & Precautions, H&P , NPO status , Patient's Chart, lab work & pertinent test results  Airway Mallampati: II TM Distance: >3 FB Neck ROM: Full    Dental  (+) Chipped, Dental Advisory Given   Pulmonary pneumonia -,          Cardiovascular hypertension, + Peripheral Vascular Disease     Neuro/Psych    GI/Hepatic Neg liver ROS, hiatal hernia,   Endo/Other  diabetes, Poorly Controlled, Type 2  Renal/GU Renal diseasePatient has suprapubic catheter- clamped.  Patient states it does not need to be emptied right now.     Musculoskeletal   Abdominal   Peds  Hematology   Anesthesia Other Findings   Reproductive/Obstetrics                          Anesthesia Physical Anesthesia Plan  ASA: III  Anesthesia Plan: MAC   Post-op Pain Management:    Induction: Intravenous  Airway Management Planned: Simple Face Mask  Additional Equipment:   Intra-op Plan:   Post-operative Plan:   Informed Consent: I have reviewed the patients History and Physical, chart, labs and discussed the procedure including the risks, benefits and alternatives for the proposed anesthesia with the patient or authorized representative who has indicated his/her understanding and acceptance.   Dental advisory given  Plan Discussed with: CRNA and Anesthesiologist  Anesthesia Plan Comments:         Anesthesia Quick Evaluation

## 2013-11-01 NOTE — Progress Notes (Signed)
PT Cancellation Note  Patient Details Name: Lawrence [name] MRN: [id number] DOB: [birth year]   Cancelled Treatment:    Reason Eval/Treat Not Completed: Patient at procedure or test/unavailable   [name] 11/01/2013, 1:36 PM

## 2013-11-01 NOTE — Progress Notes (Signed)
I saw the patient and agree with the above assessment and plan.    Patient is doing well with new issues. Plan is for AV fistula placement today. I appreciate the assistance of Dr. Edilia Bo.  GFR stable. Volume status is stable. Results of renal biopsy anticipated to perhaps later today or tomorrow. I think that after placement of fistula patient will be stable for discharge.

## 2013-11-01 NOTE — Progress Notes (Signed)
MD to place diet order post OR procedure. Louie Bun S 2:30 PM

## 2013-11-01 NOTE — Interval H&P Note (Signed)
History and Physical Interval Note:  11/01/2013 2:35 PM  Lawrence [name]  has presented today for surgery, with the diagnosis of End Stage Renal Disease  The various methods of treatment have been discussed with the patient and family. After consideration of risks, benefits and other options for treatment, the patient has consented to  Procedure(s): RIGHT ARM ARTERIOVENOUS (AV) FISTULA CREATION VS INSERTION RIGHT ARM AVGG (Right) as a surgical intervention .  The patient's history has been reviewed, patient examined, no change in status, stable for surgery.  I have reviewed the patient's chart and labs.  Questions were answered to the patient's satisfaction.     DICKSON,CHRISTOPHER S

## 2013-11-01 NOTE — Progress Notes (Signed)
Family Medicine Teaching Service Daily Progress Note Intern Pager: [phone]  Patient name: Lawrence [name] Medical record number: [id number] Date of birth: [birth year] Age: 53 y.o. Gender: male  Primary Care Provider: [name], MD Consultants: Nephrology Code Status: Full  Pt Overview and Major Events to Date:  9/15 - admitted with dyspnea and ARF 9/16 - Started on IV lasix for diuresis 9/18 - switched to PO lasix 9/22 - Renal biopsy  Assessment and Plan: Lawrence [name] is a 53 y.o. male presenting with worsened dyspnea and ARF. PMH is significant for chronic cough, CKD, poorly controlled HTN and DM, HLD, neuropathy, and urinary retention.   #AoCKD. Cr 2.78 on admission up from baseline over past 6 months of 1.6-1.7, prior to that 1.2 (11/2012). Labs thus far most consistent with nephrotic syndrome (UPC 8). Most likely diabetic nephropathy. Currently diuresing with PO lasix. - Nephrology consulted, appreciate assistance - Continue HTN and DM management - s/p renal biopsy 9/22, f/u pathology - Vascular consulted; will have AVF vs graft placed (on schedule for 9/25)   #Dyspnea likely due to volume overload in the setting of AoCKD. Improving. Low suspicion for PE (negative LE dopplers, improvement of dyspnea with diuresis and lack of desats on room air). Completed full course abx for CAP.  - Echo: 50% EF, grade 2 diastolic dysfunction, though limited study due to inability to position patient secondary to orthopnea - Will re-attempt Echo, likely outpt  #UTI. UA remarkable for protein, nitrites, and leukocyte, and many bacteria. 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 Normocytic, elevated ferritin, low iron. Likely secondary to CKD, but possibly iron deficiency playing  a role.  - PO Fe - Colonoscopy recommendation if not already done at 50.  - Consider transfusion for HgB <7.0, will need to discuss with nephrology risk vs benefits of extra volume in already volume overloaded patient - Holding Aspirin  in setting of anemia   #Poorly controlled HTN normotensive currently - Continue norvasc, metoprolol  BID  - Continue to hold lisinopril in setting of AoCKD.   #DM. On 20U lantus at home. CBGs moderately well controlled here - Lantus208U daily - sensitive SSI.  - DM educator consulted.   #RLE Wound- Well healing approximately 3cm irregular wound on RLE -bacitracin ointment bid  #HLD  - Continue lipitor  daily.   #Chronic Cough, ear pressure -Continue home flonase, PPI -Consider switching from ACEi to ARB (currently holding) -Add atrovent nasal spray for likely eustachian tube disfunction  FEN/GI: SLIV, Heart Healthy/Carb Modified diet  Prophylaxis: SCDs, no chemoprophylaxis in setting of anemia  Disposition: Discharge pending AVF vs graft placement.   Subjective: NAEON. Episode of emesis this morning. Hungry now. Dyspnea continuing to improve.   Objective: Temp:  [98.1 F (36.7 C)-99.2 F (37.3 C)] 98.3 F (36.8 C) (09/24 0700) Pulse Rate:  [69-79] 77 (09/24 0700) Resp:  [20] 20 (09/23 2100) BP: (110-140)/(52-57) 140/57 mmHg (09/24 0700) SpO2:  [96 %-99 %] 99 % (09/24 0700) Weight:  [208 lb 8.9 oz (94.6 kg)] 208 lb 8.9 oz (94.6 kg) (09/24 0700) Physical Exam: General: NAD, sitting up in bedside chair Cardiovascular: RRR, no murmurs appreciated Respiratory: nml WOB; CTAB though decreased airflow at bibasilar fields Abdomen: Soft, nontender, nondistended Extremities: Right BKA, Left leg with 2+ edema to knee, 3-4cm well healing  wound in RLE Skin: Warm, dry Neuro: Awake, alert  Laboratory:  Recent Labs Lab 10/30/13 0547 10/31/13 0518 11/01/13 0456  WBC 6.5 6.4 7.4  HGB 8.5* 8.5* 8.7*  HCT 24.9* 25.7* 25.4*  PLT 181  183 193    Recent Labs Lab 10/30/13 0547 10/31/13 0518 11/01/13 0456  NA 137 139 138  K 4.1 4.0 4.0  CL 101 103 102  CO2 BUN 68* 71* 68*  CREATININE 4.16* 4.22* 3.89*  CALCIUM 8.7 8.9 9.0  GLUCOSE 214* 191* 202*    Imaging/Diagnostic Tests: CXR (9/18)  Grossly unchanged findings of pulmonary edema, small bilateral effusions and associated bibasilar opacities, left greater than right, atelectasis versus infiltrate. A follow-up chest radiograph in 4 to 6 weeks after treatment is recommended to ensure resolution.   Lawrence Doe, MD 11/01/2013, 9:27 AM PGY-1, [facility] FPTS Intern pager: [phone], text pages welcome

## 2013-11-01 NOTE — Progress Notes (Signed)
MD notified, pt c/o nausea. MD to place order. Louie Bun S 7:01 PM

## 2013-11-01 NOTE — Progress Notes (Signed)
One attempt to insert new iv access was made and was unsuccessful.  Mr Lawrence [name] refused to be re-stuck. He was informed of the risk of infection by not having a new iv.

## 2013-11-01 NOTE — Op Note (Signed)
    NAME: Lawrence [name]   MRN: [id number] DOB: 11/25/1960    DATE OF OPERATION: 11/01/2013  PREOP DIAGNOSIS: Stage IV chronic kidney disease   POSTOP DIAGNOSIS: Same  PROCEDURE:  Right brachiocephalic AV fistula  SURGEON: Di Kindle. Edilia Bo, MD, FACS  ASSIST: Lianne Cure PA  ANESTHESIA: local with sedation   EBL: minimal  INDICATIONS: Lawrence [name] is a 53 y.o. male who presents for new access.  FINDINGS: 3.5 mm cephalic vein. 3 mm brachial artery.  TECHNIQUE: The patient was taken to the operating room and sedated by anesthesia. The right upper extremity was prepped and draped in the usual sterile fashion. After the skin was anesthetized, a transverse incision was made at the antecubital level. Here the cephalic vein was dissected free.  It was ligated distally and irrigated out with heparinized saline. It was an approximately 3.5 mm vein. The brachial artery was dissected free beneath the fascia. The patient was heparinized. The brachial artery was clamped proximally and distally and a longitudinal arteriotomy was made. The vein was sewn end to side to the artery using continuous 6-0 Prolene suture. At the completion was a palpable thrill in the fistula and a radial and ulnar signal with the Doppler. The heparin was partially reversed with protamine. The wounds closed the deep layer of 3-0 Vicryl the skin closed with 4-0 Vicryl. Dermabond was applied. The patient tolerated the procedure well and was transferred to the recovery room in stable condition. All needle and sponge counts were correct.  Waverly Ferrari, MD, FACS [facility]  DATE OF DICTATION:   11/01/2013

## 2013-11-01 NOTE — Discharge Instructions (Signed)
You were admitted to the hospital with acute kidney failure and shortness of breath. While here, you received lasix which diuresed around 12 liters of fluid out of your system. You also had an echocardiogram to look at your heart, however it was not entirely conclusive and will need to be repeated as an outpatient. You additionally had a biopsy of your kidneys, and the results were not back by the time your were discharged. You met with our vascular surgeons and had a fistula placed in cause you need dialysis in the future. It is important that you follow up with your PCP and that you follow up with the nephrologist you met here, Dr Lorrene Reid.   Kidney Failure Kidney failure happens when the kidneys cannot remove waste and excess fluid that naturally builds up in your blood after your body breaks down food. This leads to a dangerous buildup of waste products and fluid in the blood. HOME CARE  Follow your diet as told by your doctor.  Take all medicines as told by your doctor.  Keep all of your dialysis appointments. Call if you are unable to keep an appointment. GET HELP RIGHT AWAY IF:   You make a lot more or very little pee (urine).  Your face or ankles puff up (swell).  You develop shortness of breath.  You develop weakness, feel tired, or you do not feel hungry (appetite loss).  You feel poorly for no known reason. MAKE SURE YOU:   Understand these instructions.  Will watch your condition.  Will get help right away if you are not doing well or get worse. Document Released: 04/21/2009 Document Revised: 04/19/2011 Document Reviewed: 05/28/2009 [facility] Patient Information 2015 [place], [place]. This information is not intended to replace advice given to you by your health care provider. Make sure you discuss any questions you have with your health care provider.

## 2013-11-02 ENCOUNTER — Telehealth: Payer: Self-pay | Admitting: Vascular Surgery

## 2013-11-02 ENCOUNTER — Encounter (HOSPITAL_COMMUNITY): Payer: Self-pay | Admitting: Vascular Surgery

## 2013-11-02 LAB — CBC
HCT: 25.3 % — ABNORMAL LOW (ref 39.0–52.0)
Hemoglobin: 8.5 g/dL — ABNORMAL LOW (ref 13.0–17.0)
MCH: 27.6 pg (ref 26.0–34.0)
MCHC: 33.6 g/dL (ref 30.0–36.0)
MCV: 82.1 fL (ref 78.0–100.0)
Platelets: 210 10*3/uL (ref 150–400)
RBC: 3.08 MIL/uL — AB (ref 4.22–5.81)
RDW: 14.6 % (ref 11.5–15.5)
WBC: 7.3 10*3/uL (ref 4.0–10.5)

## 2013-11-02 LAB — RENAL FUNCTION PANEL
Albumin: 2.5 g/dL — ABNORMAL LOW (ref 3.5–5.2)
Anion gap: 15 (ref 5–15)
BUN: 67 mg/dL — ABNORMAL HIGH (ref 6–23)
CALCIUM: 9.1 mg/dL (ref 8.4–10.5)
CHLORIDE: 104 meq/L (ref 96–112)
CO2: 23 meq/L (ref 19–32)
Creatinine, Ser: 3.79 mg/dL — ABNORMAL HIGH (ref 0.50–1.35)
GFR, EST AFRICAN AMERICAN: 20 mL/min — AB (ref >90)
GFR, EST NON AFRICAN AMERICAN: 17 mL/min — AB (ref >90)
Glucose, Bld: 127 mg/dL — ABNORMAL HIGH (ref 70–99)
POTASSIUM: 4.1 meq/L (ref 3.7–5.3)
Phosphorus: 5.4 mg/dL — ABNORMAL HIGH (ref 2.3–4.6)
SODIUM: 142 meq/L (ref 137–147)

## 2013-11-02 LAB — GLUCOSE, CAPILLARY
GLUCOSE-CAPILLARY: 208 mg/dL — AB (ref 70–99)
Glucose-Capillary: 134 mg/dL — ABNORMAL HIGH (ref 70–99)

## 2013-11-02 MED ORDER — ACETAMINOPHEN 325 MG PO TABS: 650.0000 mg | ORAL_TABLET | Freq: Four times a day (QID) | ORAL | Status: DC | PRN

## 2013-11-02 NOTE — Progress Notes (Signed)
I saw the patient and agree with the above assessment and plan.    Patient doing clinically well and status post brachiocephalic fistula with Dr. Edilia Bo yesterday.  Renal function is stable with electrolytes are within normal limits. His volume status is stable. His hemoglobin is stable at 8.5 he is status post very hemodynamic spectral improve. Decision as to whether or not he needs and ESA can be made as an outpatient. His renal biopsy results could have a preliminary finding this afternoon and will be followed up in the clinic. He will followup with Dr. Eliott Nine.

## 2013-11-02 NOTE — Progress Notes (Signed)
Physical Therapy Treatment Patient Details Name: Lawrence [name] MRN: [id number] DOB: 1961-02-07 Today's Date: 11/02/2013    History of Present Illness Lawrence [name] is a 53 y.o. male presenting with worsened dyspnea and ARF. PMH is significant for chronic cough, CKD, poorly controlled HTN and DM, HLD, neuropathy, acute frontal sinusitis in August, and urinary retention.  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  Pt lives alone and takes SCAT if he has to go out.  Pt is planning to have kidney biopsy on 10-30-13    PT Comments    Pt hasnt walked since last PT visit and has had two procedures.  He is weaker than he was last week.  Pt did short walk today as DC soon and didn't want to be fatiqued.  Pts sister present for eduction.  Pt educated on safe home set up -higher seat heights.  Pt educated on improtance of slowly increasing his walking and how to progress to a walking program in his home.  We also talked about how to document his endurance and how to help motivate him to improve.  No further questions.  I encoruaged sister to help out as much as she can initially until pt gets his strength back.  She is concerned as pt not very active prior to illness - pt educated on ideas to be more active in everyday setting.  All education complete.  No equipment or follow up needs at this time.  Follow Up Recommendations        Equipment Recommendations       Recommendations for Other Services       Precautions / Restrictions Precautions Precautions: Fall Restrictions Weight Bearing Restrictions: No RLE Weight Bearing: Weight bearing as tolerated    Mobility  Bed Mobility                  Transfers                 General transfer comment: Pt stood from his lower chair - i educated him and his sister in how to raise chair heights at home - the higer the surfce the easier to get up.  Pt stood from lower chair with extra effort but di dnt need  any help  Ambulation/Gait Ambulation/Gait assistance: Supervision Ambulation Distance (Feet): 30 Feet Assistive device: Rolling walker (2 wheeled) Gait Pattern/deviations: Step-through pattern     General Gait Details: Pt still with no additional layers in prosthesis.  Pt hasnt walked since last PT visit - he has had 2 procedures since then.  Pt weaker today.  we discussed in detail how he can work on increasing his walking  how to add laps in his home, how to tiem and keep up iwht his endurance.  pt pushed to slowly increase his walking which will make him safer in home and have easeir time with getting out into community.  sister present for this treatment and heard all education as well.  she expresses concern as pt not very active at home.  I explained to her that pt can do his walking etc and will just need to build up his endurance  - this is up to pt to do.   Stairs Stairs:  (pt has ramp - going home via ambulance)          Wheelchair Mobility    Modified Rankin (Stroke Patients Only)       Balance  Cognition Arousal/Alertness: Awake/alert Behavior During Therapy: WFL for tasks assessed/performed Overall Cognitive Status: Within Functional Limits for tasks assessed                      Exercises      General Comments        Pertinent Vitals/Pain Pain Assessment:  (pt with pain right arm procedure site.)    Home Living                      Prior Function            PT Goals (current goals can now be found in the care plan section) Progress towards PT goals: Progressing toward goals (needs to walk more)    Frequency       PT Plan Current plan remains appropriate    Co-evaluation             End of Session Equipment Utilized During Treatment: Gait belt Activity Tolerance: Patient tolerated treatment well (didnt push pt as going home soon and didnt want to fatique him) Patient  left: in chair;with call bell/phone within reach;with family/visitor present (pts sister present)     Time: 1610-9604 PT Time Calculation (min): 13 min  Charges:  $Gait Training: 8-22 mins                    G Codes:      Judson Roch 11/02/2013, 12:39 PM 11/02/2013   Ranae Palms, PT

## 2013-11-02 NOTE — Progress Notes (Signed)
Discharge instructions given. Pt verbalized understanding and all questions were answered.  

## 2013-11-02 NOTE — Progress Notes (Signed)
FMTS Attending Note Patient seen and examined by me, reports improvement in nausea. Soreness at site of surgery but pain controlled.  Mild improvement in renal function today.  Plan for discharge today, with close follow up by me in office, as well as with Nephrology as outpatient.  [name], MD

## 2013-11-02 NOTE — Progress Notes (Signed)
Lawrence [name]: Patient seen at bedside this AM, complaining of some nausea overnight. Right brachiocephalic AVF placed yesterday, some mild pain. Good thrill and bruit. Cr improved today to 3.79.  Objective: Medications: . sodium chloride 10 mL/hr at 11/01/13 1400   Scheduled Medications: . amLODipine  10 mg Oral Daily  . atorvastatin  40 mg Oral Daily  . bacitracin   Topical BID  . brimonidine  1 drop Left Eye BID  . brinzolamide  1 drop Left Eye BID  . calcitRIOL  0.25 mcg Oral Daily  . fluticasone  2 spray Each Nare Daily  . furosemide  40 mg Oral BID  . insulin aspart  0-9 Units Subcutaneous TID WC  . insulin glargine  20 Units Subcutaneous QHS  . ipratropium  2 spray Each Nare TID  . metoprolol tartrate  25 mg Oral BID  . pantoprazole  80 mg Oral Daily  . sodium chloride  3 mL Intravenous Q12H   PRN Meds:.acetaminophen, HYDROcodone-acetaminophen, HYDROmorphone (DILAUDID) injection, ondansetron (ZOFRAN) IV, oxybutynin, oxyCODONE-acetaminophen, senna, traZODone  BP 130/54  Pulse 70  Temp(Src) 98.2 F (36.8 C) (Oral)  Resp 16  Ht  (1.676 m)  Wt 208 lb 8.9 oz (94.6 kg)  BMI 33.68 kg/m2  SpO2 100%   Intake/Output Summary (Last 24 hours) at 11/02/13 1610 Last data filed at 11/01/13 2139  Gross per 24 hour  Intake    700 ml  Output    775 ml  Net    -75 ml    Weight change:  10/31/13 0632 207 lb 7.3 oz (94.1 kg) 10/26/13 0536 209 lb 10.5 oz (95.1 kg) 10/25/13 0653 211 lb 3.2 oz (95.8 kg) 10/24/13 0419 214 lb 15.2 oz (97.5 kg) 10/23/13 1145 223 lb (101.152 kg)      EXAM:  General: Morbidly obese male, alert, cooperative, NAD. HEENT: PERRL, EOMI. Moist mucus membranes.  Neck: Full range of motion without pain, supple, no lymphadenopathy or carotid bruits Lungs: Air entry clear and equal bilaterally. No wheezes or rales heard on exam today.  Heart: RRR, no murmurs, gallops, or rubs Abdomen: Soft, non-tender, non-distended, BS +. Suprapubic catheter tube  present.  Extremities: Right leg BKA w/ prosthesis. LLE still w/ +2 pitting edema. RUE AVF w/ good thrill and bruit. Incision appears clean, dry, intact.  Neurologic: Alert & oriented X3, cranial nerves II-XII intact, strength grossly intact, sensation intact to light touch   Labs: Basic Metabolic Panel:  Recent Labs Lab 10/30/13 0547 10/31/13 0518 11/01/13 0456  NA 137 139 138  K 4.1 4.0 4.0  CL 101 103 102  CO2 GLUCOSE 214* 191* 202*  BUN 68* 71* 68*  CREATININE 4.16* 4.22* 3.89*  CALCIUM 8.7 8.9 9.0  PHOS 5.7* 5.4* 5.5*    Creatinine, Ser   Date/Time  Value  Ref Range  Status   11/02/2013 3.79 0.50 - 1.35 mg/dL Final   9/60/4540 9.81 0.50 - 1.35 mg/dL Final  1/91/4782 9.56 2.13 - 1.35 mg/dL Final   0/86/5784 6.96 0.50 - 1.35 mg/dL Final   2/95/2841 3.24 0.50 - 1.35 mg/dL Final  05/10/270 5.36 6.44 - 1.35 mg/dL Final   0/34/7425 9.56   Diuretics reduced 0.50 - 1.35 mg/dL Final   3/87/5643 3.29   Diuretics changed po 0.50 - 1.35 mg/dL Final   06/26/8414 6.06 0.50 - 1.35 mg/dL Final   04/08/6008 9:32 AM  3.00*  diuresis initiated for CHF 0.50 - 1.35 mg/dL  Final   3/55/7322 02:54 PM  2.67*  0.50 - 1.35 mg/dL  Final   1/61/09  6.04  Lisinopril discontinued  0.50 - 1.35 mg/dL Final   06/12/07  8.11  9.14 - 1.35 mg/dL Final   08/16/27  5.62  1.30 - 1.35 mg/dL Final   8/65/78  4.69 Lisinopril restarted  0.50 - 1.35 mg/dL Final   08/07/5282 1:32 AM  1.63*  0.50 - 1.35 mg/dL  Final   44/02/270 5:36 AM  1.23  0.50 - 1.35 mg/dL  Final    Liver Function Tests:  Recent Labs Lab 10/30/13 0547 10/31/13 0518 11/01/13 0456  ALBUMIN 2.3* 2.5* 2.3*   CBC:  Recent Labs Lab 10/28/13 0437 10/29/13 0230 10/30/13 0547 10/31/13 0518 11/01/13 0456  WBC 6.0 5.9 6.5 6.4 7.4  HGB 7.0* 8.0* 8.5* 8.5* 8.7*  HCT 20.5* 23.1* 24.9* 25.7* 25.4*  MCV 82.3 80.5 81.1 83.4 81.7  PLT 149* 148* 181 183 193    CBG:  Recent Labs Lab 11/01/13 0624 11/01/13 1314 11/01/13 1622  11/01/13 2134 11/02/13 0623  GLUCAP 171* 138* 105* 91 134*    Assessment/Plan: 53 y.o. male w/ PMHx of HTN, HLD, DM type II, s/p right BKA, chronic suprapubic catheter, and CKD stage 3, admitted w/ worsening SOB and acute on chronic renal failure w/ nephrotic range proteinuria.   Acute on Chronic CKD- CKD stage 3 and nephrotic syndrome most likely associated w/ poorly controlled HTN and DM type II. On Lasix 40 mg bid currently. Renal function improved today. RUE AVF placed 11/01/13. Patient is stable for discharge, will arrange close follow up as outpatient.  Cough- Felt that this was largely contributed to by possible underlying new onset CHF in combination w/ preceding respiratory illness and nephrotic syndrome. ECHO from 9/16 shows EF of 50% w/ grade 2 diastolic dysfunction. Lasix as above. History of urinary retention w/ suprapubic catheter- Urinating normally, suprapubic catheter plugged. No urinary symptoms. Urine Culture on admission w/ >100,000 colonies serratia fonticola. Received 1 dose Levaquin. Patient to follow up w/ Dr. Brunilda Payor on discharge for removal of suprapubic catheter.  Secondary Hyperparathyroidism- Previous PTH in 05/2012 was 322. Started on Zemplar by Dr. Briant Cedar in 06/2012, but it is unclear if patient has been taking this (not on medication list either). Noted to have elevated phos of 4.7 on admission.  Repeat PTH 243 on 9/16. Continue po calcitriol. Anemia- Thought to be 2/2 iron deficiency in combination w/ CKD, started on Ferrous sulfate 325 bid on admission. S/p 1 unit PRBC's 10/28/13. Hb 8.5 this AM. Repeat iron studies show  Iron of 30, Ferritin 351 and Tsat of 15%. Given Feraheme IV for iron load 10/31/13. HTN- BP well controlled. Currently on Norvasc 10 mg qd + Lopressor 25 mg bid. Continue strict BP control.  DM type II- Most recent HbA1c 7.5. Continue current insulin regimen.    Signed: Lars Masson, MD 11/02/2013 7:02 AM

## 2013-11-02 NOTE — Progress Notes (Signed)
Family Medicine Teaching Service Daily Progress Note Intern Pager: [phone]  Patient name: Lawrence [name] Medical record number: [id number] Date of birth: 1960-02-18 Age: 53 y.o. Gender: male  Primary Care Provider: [name], MD Consultants: Nephrology Code Status: Full  Pt Overview and Major Events to Date:  9/15 - admitted with dyspnea and ARF 9/16 - Started on IV lasix for diuresis 9/18 - switched to PO lasix 9/22 - Renal biopsy  Assessment and Plan: Lawrence [name] is a 53 y.o. male presenting with worsened dyspnea and ARF. PMH is significant for chronic cough, CKD, poorly controlled HTN and DM, HLD, neuropathy, and urinary retention.   #AoCKD. Cr 2.78 on admission up from baseline over past 6 months of 1.6-1.7, prior to that 1.2 (11/2012). Labs thus far most consistent with nephrotic syndrome (UPC 8). Most likely diabetic nephropathy. Currently diuresing with PO lasix. - Nephrology consulted, appreciate assistance - Continue HTN and DM management - s/p renal biopsy 9/22, f/u pathology - Surgery for right brachiocephalic AV fistula on 9/24  #Dyspnea likely due to volume overload in the setting of AoCKD. Improving. Low suspicion for PE (negative LE dopplers, improvement of dyspnea with diuresis and lack of desats on room air). Completed full course abx for CAP.  - Echo: 50% EF, grade 2 diastolic dysfunction, though limited study due to inability to position patient secondary to orthopnea - Will re-attempt Echo as outpt  #UTI. UA remarkable for protein, nitrites, and leukocyte, and many bacteria. 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 Normocytic, elevated ferritin, low iron. Likely secondary to CKD, but possibly iron deficiency playing a role.  - PO Fe -  Colonoscopy recommendation if not already done at 50.  - Consider transfusion for HgB <7.0, will need to discuss with nephrology risk vs benefits of extra volume in already volume overloaded patient - Holding Aspirin  in setting of anemia   #Poorly controlled HTN normotensive currently - Continue norvasc, metoprolol  BID  - Continue to hold lisinopril in setting of AoCKD.   #DM. On 20U lantus at home. CBGs moderately well controlled here - Lantus208U daily - sensitive SSI.  - DM educator consulted.   #RLE Wound- Well healing approximately 3cm irregular wound on RLE -bacitracin ointment bid  #HLD  - Continue lipitor  daily.   #Chronic Cough, ear pressure -Continue home flonase, PPI -Consider switching from ACEi to ARB (currently holding) -Add atrovent nasal spray for likely eustachian tube disfunction  FEN/GI: SLIV, Heart Healthy/Carb Modified diet  Prophylaxis: SCDs, no chemoprophylaxis in setting of anemia  Disposition: Discharge pending AVF vs graft placement.   Subjective: No acute complaints overnight. Denies shortness of breath.  Is having some pain at surgical site as expected. No further complaints today and states he is ready to go home.  Objective: Temp:  [97.6 F (36.4 C)-98.3 F (36.8 C)] 98.2 F (36.8 C) (09/25 0540) Pulse Rate:  [59-71] 70 (09/25 0540) Resp:  [14-18] 16 (09/25 0540) BP: (114-138)/(48-58) 130/54 mmHg (09/25 0540) SpO2:  [94 %-100 %] 100 % (09/25 0540)  Physical Exam: General: 52yo male resting comfortably in bedside chair and in no apparent distress Cardiovascular: RRR, no murmurs appreciated Respiratory: nml WOB; CTAB though decreased airflow at bibasilar fields Abdomen: Soft, nontender, nondistended Extremities: Right BKA, Left leg with 2+ edema to knee,  3-4cm well healing wound in RLE Skin: Warm, dry Neuro: Awake, alert  Laboratory:  Recent Labs Lab 10/30/13 0547 10/31/13 0518 11/01/13 0456  WBC 6.5 6.4 7.4  HGB  8.5* 8.5* 8.7*  HCT 24.9* 25.7* 25.4*  PLT 181 183 193    Recent Labs Lab 10/30/13 0547 10/31/13 0518 11/01/13 0456  NA 137 139 138  K 4.1 4.0 4.0  CL 101 103 102  CO2 BUN 68* 71* 68*  CREATININE 4.16* 4.22* 3.89*  CALCIUM 8.7 8.9 9.0  GLUCOSE 214* 191* 202*    Imaging/Diagnostic Tests: CXR (9/18)  Grossly unchanged findings of pulmonary edema, small bilateral effusions and associated bibasilar opacities, left greater than right, atelectasis versus infiltrate. A follow-up chest radiograph in 4 to 6 weeks after treatment is recommended to ensure resolution.   [address] [place], [place] 11/02/2013, 7:26 AM PGY-1, [facility] FPTS Intern pager: [phone], text pages welcome

## 2013-11-02 NOTE — Progress Notes (Signed)
   VASCULAR SURGERY ASSESSMENT & PLAN:  * 1 Day Post-Op s/p: Right Brachio-cephalic AVF  *  Fistula is working well. No steal symptoms  * I have arranged F/U in 6 weeks to check on the maturation of the fistula  * Vascular will be available as needed.   SUBJECTIVE: No specific complaints.  PHYSICAL EXAM: Filed Vitals:   11/01/13 1738 11/01/13 1740 11/01/13 2034 11/02/13 0540  BP: 118/48 125/54 131/51 130/54  Pulse: 59  71 70  Temp:  98 F (36.7 C) 98.3 F (36.8 C) 98.2 F (36.8 C)  TempSrc:   Oral Oral  Resp: Height:      Weight:      SpO2: 98% 94% 99% 100%   Good thrill in right BC AVF Incision looks fine Unable to palpate radial pulse, but hand well perfused.   LABS: Lab Results  Component Value Date   WBC 7.4 11/01/2013   HGB 8.7* 11/01/2013   HCT 25.4* 11/01/2013   MCV 81.7 11/01/2013   PLT 193 11/01/2013   Lab Results  Component Value Date   CREATININE 3.89* 11/01/2013   Lab Results  Component Value Date   INR 1.18 11/01/2013   CBG (last 3)   Recent Labs  11/01/13 1622 11/01/13 2134 11/02/13 0623  GLUCAP 105* 91 134*   Active Problems:   ARF (acute renal failure)  Lawrence [name] Beeper: [phone] 11/02/2013

## 2013-11-02 NOTE — Progress Notes (Signed)
CSW (Clinical Child psychotherapist) made aware that pt will need wheel chair transportation home. CSW called [employer] Transportation and confirmed that they will be able to arrange transport for pt to dc home. They have set pt up for pick up time 1:00pm from [facility]. CSW notified pt nurse. Pt nurse to update pt. Pt has no further hospital social work needs.  Arohi Salvatierra, LCSWA [phone]

## 2013-11-02 NOTE — Telephone Encounter (Addendum)
Message copied by Fredrich Birks on Fri Nov 02, 2013  9:34 AM ------      Message from: Lorin Mercy K      Created: Thu Nov 01, 2013  4:55 PM      Regarding: Schedule                   ----- Message -----         From: Chuck Hint, MD         Sent: 11/01/2013   4:12 PM           To: Vvs Charge Pool      Subject: charge and f/u                                           PROCEDURE:  Right brachiocephalic AV fistula            SURGEON: Di Kindle. Edilia Bo, MD, FACS            ASSIST: Lianne Cure PA            He will need a follow up visit in 6 weeks to check on the maturation of this fistula. Thank you. He will need a duplex at that time. CD ------  11/02/13: spoke with pt to schedule, mailed letter also, dpm

## 2013-11-09 ENCOUNTER — Encounter (HOSPITAL_COMMUNITY): Payer: Self-pay

## 2013-11-09 ENCOUNTER — Other Ambulatory Visit: Payer: Medicaid Other

## 2013-11-09 DIAGNOSIS — N181 Chronic kidney disease, stage 1: Secondary | ICD-10-CM

## 2013-11-09 DIAGNOSIS — N189 Chronic kidney disease, unspecified: Secondary | ICD-10-CM

## 2013-11-09 LAB — RETICULOCYTES
ABS Retic: 60.1 10*3/uL (ref 19.0–186.0)
RBC.: 3.34 MIL/uL — ABNORMAL LOW (ref 4.22–5.81)
Retic Ct Pct: 1.8 % (ref 0.4–2.3)

## 2013-11-09 LAB — CBC
HCT: 27.4 % — ABNORMAL LOW (ref 39.0–52.0)
HEMOGLOBIN: 9.2 g/dL — AB (ref 13.0–17.0)
MCH: 27.5 pg (ref 26.0–34.0)
MCHC: 33.6 g/dL (ref 30.0–36.0)
MCV: 82 fL (ref 78.0–100.0)
Platelets: 265 10*3/uL (ref 150–400)
RBC: 3.34 MIL/uL — AB (ref 4.22–5.81)
RDW: 15.5 % (ref 11.5–15.5)
WBC: 10.5 10*3/uL (ref 4.0–10.5)

## 2013-11-09 NOTE — Progress Notes (Signed)
BMP,RETIC,CBC AND RENAL DONE TODAY Koralyn Prestage

## 2013-11-10 LAB — RENAL FUNCTION PANEL
ALBUMIN: 2.8 g/dL — AB (ref 3.5–5.2)
BUN: 64 mg/dL — ABNORMAL HIGH (ref 6–23)
CO2: 25 meq/L (ref 19–32)
Calcium: 8.6 mg/dL (ref 8.4–10.5)
Chloride: 100 mEq/L (ref 96–112)
Creat: 3.42 mg/dL — ABNORMAL HIGH (ref 0.50–1.35)
GLUCOSE: 184 mg/dL — AB (ref 70–99)
Phosphorus: 5 mg/dL — ABNORMAL HIGH (ref 2.3–4.6)
Potassium: 4.3 mEq/L (ref 3.5–5.3)
Sodium: 139 mEq/L (ref 135–145)

## 2013-11-10 LAB — BASIC METABOLIC PANEL
BUN: 64 mg/dL — AB (ref 6–23)
CHLORIDE: 100 meq/L (ref 96–112)
CO2: 25 meq/L (ref 19–32)
Calcium: 8.6 mg/dL (ref 8.4–10.5)
Creat: 3.42 mg/dL — ABNORMAL HIGH (ref 0.50–1.35)
Glucose, Bld: 184 mg/dL — ABNORMAL HIGH (ref 70–99)
POTASSIUM: 4.3 meq/L (ref 3.5–5.3)
Sodium: 139 mEq/L (ref 135–145)

## 2013-11-12 ENCOUNTER — Ambulatory Visit (INDEPENDENT_AMBULATORY_CARE_PROVIDER_SITE_OTHER): Payer: Medicaid Other | Admitting: Family Medicine

## 2013-11-12 ENCOUNTER — Encounter: Payer: Self-pay | Admitting: Family Medicine

## 2013-11-12 VITALS — BP 146/68 | HR 86 | Temp 98.7°F | Wt 193.6 lb

## 2013-11-12 DIAGNOSIS — I5032 Chronic diastolic (congestive) heart failure: Secondary | ICD-10-CM

## 2013-11-12 DIAGNOSIS — D649 Anemia, unspecified: Secondary | ICD-10-CM

## 2013-11-12 DIAGNOSIS — R748 Abnormal levels of other serum enzymes: Secondary | ICD-10-CM

## 2013-11-12 DIAGNOSIS — D508 Other iron deficiency anemias: Secondary | ICD-10-CM

## 2013-11-12 DIAGNOSIS — I5030 Unspecified diastolic (congestive) heart failure: Secondary | ICD-10-CM | POA: Insufficient documentation

## 2013-11-12 DIAGNOSIS — N179 Acute kidney failure, unspecified: Secondary | ICD-10-CM

## 2013-11-12 DIAGNOSIS — R7989 Other specified abnormal findings of blood chemistry: Secondary | ICD-10-CM

## 2013-11-12 NOTE — Assessment & Plan Note (Signed)
Patient found to have grade 2 diastolic dysfunction on recent Echocardiogram during hospitalization.  -needs repeat Echocardiogram due to incomplete visualization -discussed need for repeat study with patient, it was decided to hold on ordering this at this time due to other acute issues with kidney disease -on adequate treatment for CHF with asa/statin/BB/Diuretic, can not tolerate ACE-I due to CKD

## 2013-11-12 NOTE — Assessment & Plan Note (Signed)
Patient presents for hospital discharge follow up from acute on chronic renal failure. Clinically consistent with diabetic nephropathy. Biopsy showed following results. 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 -patient has nephrology follow up scheduled for tomorrow -s/p right AVF placement during recent hospitalization in preparation for future HD -no changes made to current diuretic regimen (continue Laxis 40 mg BID) -patient reports poor urine output, will place on 1200 mL fluid restriction until seen by nephrology

## 2013-11-12 NOTE — Patient Instructions (Addendum)
Your kidney function is slightly improved from your discharge. Please keep your appointment tomorrow with the kidney doctor. For the time being please limit your fluid intake to 1200 ml per day ( 40 oz), please discuss the fluid restriction and lasix dose with the kidney doctor  We will not reorder the Echocardiogram at this time however you will need to discuss this with Dr. Mauricio PoBreen at follow up visits.  Return to see Dr. Mauricio PoBreen in 2 weeks.

## 2013-11-12 NOTE — Progress Notes (Signed)
   Subjective:    Patient ID: Lawrence CalicoMark J [name], male    DOB: 30-Sep-1960, 53 y.o.   MRN: [id number]  HPI 53 y/o male presents for hospital follow up. Admitted 10/23/13 - 11/02/13 due to volume overload from acute on chronic CKD.   Acute on chronic CKD - patient had extensive work up by nephrology including renal US, UA, ANA, ANCA, complement levels, SPEP/UPEP, and renal biopsy (pending at time of discharge). Work up was suggestive of diabetic nephropathy. Nephrology is considering HD in the near future, seen by vascular surgery who placed right AVF, has follow up scheduled with nephrology tomorrow and vascular surgery in mid-November, he has been taking Lasix 40 mg BID, reports little urine output (via suprapubic catheter), his dyspnea is improved from time of admission however still report dyspnea especially with any exertion  Diastolic CHF - Echocardiogram obtained during hospitalization to evaluate for CHF, Echo results below, has 3 pillow orthopnea at baseline, LLE edema is improved (has right LE prosthesis), no chest pain, no palpiations  Urinary retention with suprapubic catheter - seen by urology last week, no bladder spasms, patient reports low volume output, he was able to pass urine via the urethra during hospitalization  Anemia - labs in hospital suggestive of anemia of chronic disease, patient denies Melena, unsure of previous colonoscopy  Social history reviewed  Review of Systems  Constitutional: Negative for fever, chills and fatigue.  Respiratory: Positive for shortness of breath.   Cardiovascular: Positive for leg swelling. Negative for chest pain.  Gastrointestinal: Negative for nausea, vomiting and diarrhea.       Objective:   Physical Exam Vitals: reviewed Gen: pleasant caucasian male, in wheelchair, NAD HEENT: normocephalic, PERRL, EOMI, MMM Cardiac: RRR, S1 and S2 present, no murmurs, no heaves/thrills Res: poor air entry diffusely, normal work of breathing Ext: right  LE BKA, prosthesis present, LLE 1+ edema GU: suprapubic catheter in place  Reviewed BMP and CBC from 11/09/13  Echocardiogram - EF 50%, images inadequate to comment on wall motion, grade 2 Diastolic dysfunction (repeat Echocardiogram recommended as outpatient)    Assessment & Plan:  Please see problem specific assessment and plan.

## 2013-11-12 NOTE — Assessment & Plan Note (Signed)
Labs obtained during hospitalization suggest anemia of chronic disease. Hbg improved from time of discharge (8.5 up to 9.2) when checked on 11/09/13. -continue to monitor -may need EPO if kidney function does not improve

## 2013-11-13 ENCOUNTER — Other Ambulatory Visit: Payer: Self-pay | Admitting: Family Medicine

## 2013-11-14 ENCOUNTER — Telehealth: Payer: Self-pay | Admitting: Family Medicine

## 2013-11-14 DIAGNOSIS — S88111S Complete traumatic amputation at level between knee and ankle, right lower leg, sequela: Secondary | ICD-10-CM

## 2013-11-14 NOTE — Telephone Encounter (Signed)
Refill request for insulin syringes. Also, pt states he and Dr. Mauricio PoBreen spoke about a home health nurse/PCS. Pt would like to get orders for this. Also, pt states he thinks he would benefit from PT because he is getting weaker. Pls advise.

## 2013-11-15 NOTE — Telephone Encounter (Signed)
Home health nursing and PT ordered.  Please inform patient of this.  JB

## 2013-11-15 NOTE — Telephone Encounter (Signed)
Hey norma are you doing these? Ryler Laskowski CMA

## 2013-11-15 NOTE — Telephone Encounter (Signed)
[facility] and waiting on PCP to determine whether pt can benefit from an aide.  Theresia BoughNorma Tevis Conger, MSW, LCSW [phone]

## 2013-11-16 ENCOUNTER — Telehealth: Payer: Self-pay | Admitting: Family Medicine

## 2013-11-16 NOTE — Telephone Encounter (Signed)
Home health nurse with [facility] at patient's home today and reported that patient was not at home to receive services.  Want to report that attempt was made to see pat.

## 2013-11-16 NOTE — Telephone Encounter (Signed)
Duly noted.  JB

## 2013-11-22 ENCOUNTER — Other Ambulatory Visit (HOSPITAL_COMMUNITY): Payer: Self-pay | Admitting: *Deleted

## 2013-11-23 ENCOUNTER — Ambulatory Visit (HOSPITAL_COMMUNITY)
Admission: RE | Admit: 2013-11-23 | Discharge: 2013-11-23 | Disposition: A | Discharge status: Home / Self Care | Payer: Medicaid Other | Source: Ambulatory Visit | Attending: Nephrology | Admitting: Nephrology

## 2013-11-23 DIAGNOSIS — D509 Iron deficiency anemia, unspecified: Secondary | ICD-10-CM | POA: Insufficient documentation

## 2013-11-23 DIAGNOSIS — Z5181 Encounter for therapeutic drug level monitoring: Secondary | ICD-10-CM | POA: Insufficient documentation

## 2013-11-23 MED ORDER — SODIUM CHLORIDE 0.9 % IV SOLN
510.0000 mg | Freq: Once | INTRAVENOUS | Status: AC
  Administered 2013-11-23: 510 mg via INTRAVENOUS
  Filled 2013-11-23: qty 17

## 2013-11-27 ENCOUNTER — Encounter: Payer: Self-pay | Admitting: Family Medicine

## 2013-11-27 ENCOUNTER — Ambulatory Visit (INDEPENDENT_AMBULATORY_CARE_PROVIDER_SITE_OTHER): Payer: Medicaid Other | Admitting: Family Medicine

## 2013-11-27 ENCOUNTER — Telehealth: Payer: Self-pay | Admitting: Family Medicine

## 2013-11-27 VITALS — BP 101/62 | HR 65 | Temp 98.5°F

## 2013-11-27 DIAGNOSIS — N2581 Secondary hyperparathyroidism of renal origin: Secondary | ICD-10-CM

## 2013-11-27 DIAGNOSIS — E1165 Type 2 diabetes mellitus with hyperglycemia: Secondary | ICD-10-CM

## 2013-11-27 DIAGNOSIS — I10 Essential (primary) hypertension: Secondary | ICD-10-CM

## 2013-11-27 DIAGNOSIS — R053 Chronic cough: Secondary | ICD-10-CM

## 2013-11-27 DIAGNOSIS — R05 Cough: Secondary | ICD-10-CM

## 2013-11-27 DIAGNOSIS — IMO0002 Reserved for concepts with insufficient information to code with codable children: Secondary | ICD-10-CM

## 2013-11-27 DIAGNOSIS — Z23 Encounter for immunization: Secondary | ICD-10-CM

## 2013-11-27 MED ORDER — ATORVASTATIN CALCIUM 40 MG PO TABS: 40.0000 mg | ORAL_TABLET | Freq: Every day | ORAL | Status: DC

## 2013-11-27 NOTE — Patient Instructions (Addendum)
It was a pleasure to see you today.    Because your blood pressure is low, I am holding your AMLODIPINE (norvasc) now.  I ask that your HOME HEALTH NURSE check your blood pressures for the next two weeks and call/fax the values to me at phone [phone], Fax [phone].  Also to mention if you continue with dizziness despite being off the amlodipine.  The other medications you are taking are:   Lasix 40mg  twice daily Metoprolol 25mg  twice daily Aspirin 81mg  one time daily Atorvastatin 40mg  one time daily Calcitriol 0.225mcg one time daily lantus insulin 20 units at bedtime  You received the flu vaccine today.   I would like to see you back in 2 months, or sooner if needed.   Food Basics for Chronic Kidney Disease When your kidneys are not working well, they cannot remove waste and excess substances from your blood as effectively as they did before. This can lead to a buildup and imbalance of these substances, which can affect how your body functions. This buildup can also make your kidneys work harder, causing even more damage. You may need to eat less of certain foods that can lead to the buildup of these substances in your body. By making the changes to your diet that are recommended by your dietitian or health care provider, you could possibly help prevent further kidney damage and delay or prevent the need for dialysis. The following information can help give you a basic understanding of these substances and how they affect your bodily functions. The information also gives examples of foods that contain the highest amounts of these substances. WHAT DO I NEED TO KNOW ABOUT SUBSTANCES IN MY FOOD THAT I MAY NEED TO ADJUST? Food adjustments will be different for each person with chronic kidney disease. It is important that you see a dietitian who can help you determine the specific adjustments that you will need to make for each of the following substances: Potassium Potassium affects how  steadily your heart beats. If too much potassium builds up in your blood, it can cause an irregular heartbeat or even a heart attack. Examples of foods rich in potassium include:  Milk.  Fruits.  Vegetables. Phosphorus Phosphorus is a mineral found in your bones. A balance between calcium and phosphorous is needed to build and maintain healthy bones. Too much phosphorus pulls calcium from your bones. This can make your bones weak and more likely to break. Too much phosphorus can also make your skin itch. Examples of foods rich in phosphorus include:  Milk and cheese.  Dried beans.  Peas.  Colas.  Nuts and peanut butter. Animal Protein Animal protein helps you make and keep muscle. It also helps in the repair of your body's cells and tissues. One of the natural breakdown products of protein is a waste product called urea. When your kidneys are not working properly, they cannot remove wastes such as urea like they did before you developed chronic kidney disease. You will likely need to limit the amount of protein you eat to help prevent a buildup of urea in your blood. Examples of animal protein include:  Meat (all types).  Fish and seafood.  Poultry.  Eggs. Sodium Sodium, which is found in salt, helps maintain a healthy balance of fluids in your body. Too much sodium can increase your blood pressure level and have a negative affect on the function of your heart and lungs. Too much sodium also can cause your body to retain too  much fluid, making your kidneys work harder. Examples of foods with high levels of sodium include:  Salt seasonings.  Soy sauce.  Cured and processed meats.  Salted crackers and snack foods.  Fast food.  Canned soups and most canned foods. Glucose Glucose provides energy for your body. If you have diabetes mellitus that is not properly controlled, you have too much glucose in your blood. Too much glucose in your blood can worsen the function of your  kidneys by damaging small blood vessels. This prevents enough blood flow to your kidneys to give them what they need to work. If you have diabetes mellitus and chronic kidney disease, it is important to maintain your blood glucose at a level recommended by your health care provider. SHOULD I TAKE A VITAMIN AND MINERAL SUPPLEMENT? Because you may need to avoid eating certain foods, you may not get all of the vitamins and minerals that would normally come from those foods. Your health care provider or dietitian may recommend that you take a supplement to ensure that you get all of the vitamins and minerals that your body needs.  Document Released: 04/17/2002 Document Revised: 06/11/2013 Document Reviewed: 12/22/2012 [facility] Patient Information 2015 [employer] [place]. This information is not intended to replace advice given to you by your health care provider. Make sure you discuss any questions you have with your health care provider.

## 2013-11-27 NOTE — Telephone Encounter (Signed)
After hours call  Pt calling in stating that he has 6/0 numbness/tingling type pain in his hand distal to his fistula that was placed 3 weeks ago. He states that it has been hurting some throughout the time after surgery but today its slightly worse. It feels cool to the touch but appears normal in color. He has 2 sec capillary refill.   He says that he has Vicodin, oxycodone, or tylenol for pain. I recommended trying tylenol then using hydrocodone if needed. I advised him to call his vascular surgeons office tomorrow morning and he agrees. We reviewed reasons to seek emergency medical attention including pain not controlled by his meds, worsening pain or numbness, capillary refill (we discussed), or if the coolness in his hand worsens.   Murtis SinkSam Moyinoluwa Dawe, MD [facility] Resident, PGY-3 11/27/2013, 9:50 PM

## 2013-11-28 DIAGNOSIS — N2581 Secondary hyperparathyroidism of renal origin: Secondary | ICD-10-CM | POA: Insufficient documentation

## 2013-11-28 NOTE — Assessment & Plan Note (Signed)
Symptomatic with SBP in the 100-110 range recently. Plan to hold amlodipine [facility] and [facility]/fax to office.  To maintain BB and furosemide at current doses for now.

## 2013-11-28 NOTE — Progress Notes (Signed)
   Subjective:    Patient ID: Lawrence [name], male    DOB: Dec 10, 1960, [age] y.o.   MRN: [id number]  HPI Lawrence [name] comes in for follow up visit today. Reports that his home health nurse who comes to the home twice weekly has noticed his BP to be lower (SBP 100-110s).  He has been feeling more tired and occasionally dizzy. Denies chest pain or pressure, no L lower extremity swelling. His cough and shortness of breath have improved since recent hospitalization.  Continues to take Lasix 40mg  BID. Is able to sleep supine in bed with his usual 3 pillows (no longer needs to stay in easy chair all night).   Describes his greatest concern is the uncertainty of his future health; has little in the way of social support to rely on.  ROS: denies shortness of breath, bladder spasms; continues to have suprapubic catheter but is capped off.  Describes reduced urine output. Denies fevers or chills.  Review of Systems     Objective:   Physical Exam Sallow complexion, alert, no apparent distress.  HEENT Neck supple, no cervical adenopathy.  ABD soft, nontender.  AV fistula in R arm healed; loud audible bruit.  EXTS no edema in L leg (s/p R BKA)       Assessment & Plan:

## 2013-11-28 NOTE — Assessment & Plan Note (Signed)
Improved since recent hospitalization and diuresis.  Continues on Lasix 40mg  po bid.

## 2013-11-28 NOTE — Assessment & Plan Note (Signed)
Last A1C check approx 2 months ago. To recheck at next follow up in 1-2 months.

## 2013-12-05 ENCOUNTER — Telehealth: Payer: Self-pay | Admitting: Family Medicine

## 2013-12-05 NOTE — Telephone Encounter (Signed)
Pt's BP readings:  Thurs 10/22 -- 130/68  Today 10/28 -- 100/58  Pls advise.

## 2013-12-05 NOTE — Telephone Encounter (Signed)
Please contact HH RN and/or patient, and advise that patient is to remain off the amlodipine (we stopped it at his last visit).  Paula ComptonJames Ajahni Nay, MD

## 2013-12-07 NOTE — Telephone Encounter (Signed)
Talked to mr Limehouse and he said he hasn't taken it since his last visit. [place], [place] CMA

## 2013-12-11 ENCOUNTER — Telehealth: Payer: Self-pay | Admitting: Family Medicine

## 2013-12-11 NOTE — Telephone Encounter (Signed)
bp reading today was 100/62

## 2013-12-17 ENCOUNTER — Telehealth: Payer: Self-pay | Admitting: Family Medicine

## 2013-12-17 NOTE — Telephone Encounter (Signed)
BP readings 132/64 at 2:30 pm. This is the last time [facility] will send anyone to patient's home. FYI

## 2013-12-18 NOTE — Telephone Encounter (Signed)
Continue with current medication regimen. No changes until we recheck at patient's next office visit.  JB

## 2013-12-25 ENCOUNTER — Encounter: Payer: Self-pay | Admitting: Vascular Surgery

## 2013-12-26 ENCOUNTER — Ambulatory Visit (INDEPENDENT_AMBULATORY_CARE_PROVIDER_SITE_OTHER): Payer: Medicaid Other | Admitting: Vascular Surgery

## 2013-12-26 ENCOUNTER — Ambulatory Visit (HOSPITAL_COMMUNITY)
Admit: 2013-12-26 | Discharge: 2013-12-26 | Disposition: A | Discharge status: Home / Self Care | Payer: Medicaid Other | Source: Ambulatory Visit | Attending: Vascular Surgery | Admitting: Vascular Surgery

## 2013-12-26 ENCOUNTER — Encounter: Payer: Self-pay | Admitting: Vascular Surgery

## 2013-12-26 ENCOUNTER — Telehealth: Payer: Self-pay | Admitting: Family Medicine

## 2013-12-26 VITALS — BP 117/58 | HR 69 | Resp 18 | Ht 65.0 in | Wt 183.0 lb

## 2013-12-26 DIAGNOSIS — N186 End stage renal disease: Secondary | ICD-10-CM | POA: Diagnosis present

## 2013-12-26 DIAGNOSIS — Z4931 Encounter for adequacy testing for hemodialysis: Secondary | ICD-10-CM | POA: Diagnosis not present

## 2013-12-26 DIAGNOSIS — N184 Chronic kidney disease, stage 4 (severe): Secondary | ICD-10-CM

## 2013-12-26 NOTE — Assessment & Plan Note (Signed)
This fistula is maturing nicely. He has mild steal symptoms. I've explained that if his symptoms progress to let us know as we would have to consider either ligation of his fistula or potentially a DRIL procedure. However currently his symptoms appear tolerable and stable. This fistula should be ready to use an approximate 6 weeks if it is needed.

## 2013-12-26 NOTE — Progress Notes (Signed)
   Patient name: Lawrence [name] MRN: [id number] DOB: 1960-10-30 Sex: male  REASON FOR VISIT: Follow up of  Right brachial cephalic AV fistula.  HPI: Lawrence [name] is a 53 y.o. male who had a right brachiocephalic AV fistula placed on 29/52/841309/24/2015. He comes in for 6 week follow up visit. He is not yet on dialysis. He has noted some paresthesias in the right hand that has been relatively stable. He continues to exercise his arm every day.  REVIEW OF SYSTEMS: Arly.Keller[X ] denotes positive finding; [  ] denotes negative finding  CARDIOVASCULAR:  [ ]  chest pain   [ ]  dyspnea on exertion    CONSTITUTIONAL:  [ ]  fever   [ ]  chills  PHYSICAL EXAM: Filed Vitals:   12/26/13 1535  BP: 117/58  Pulse: 69  Resp: 18  Height: 5\' 5"  (1.651 m)  Weight: 183 lb (83.008 kg)   Body mass index is 30.45 kg/(m^2). GENERAL: The patient is a well-nourished male, in no acute distress. The vital signs are documented above. CARDIOVASCULAR: There is a regular rate and rhythm. PULMONARY: There is good air exchange bilaterally without wheezing or rales. His right upper arm fistula has an excellent bruit and thrill. I cannot palpate a radial pulse on the right although he has a fairly brisk Doppler signal on the right in the radial position and pulmonary arch position. This augments with compression of his fistula.  DUPLEX: The duplex shows that the diameters of the fistula range from 0.75-1.05 cm. There are no areas of significant stenosis noted.  MEDICAL ISSUES:  Chronic kidney disease (CKD), stage IV (severe) This fistula is maturing nicely. He has mild steal symptoms. I've explained that if his symptoms progress to let us know as we would have to consider either ligation of his fistula or potentially a DRIL procedure. However currently his symptoms appear tolerable and stable. This fistula should be ready to use an approximate 6 weeks if it is needed.    Kavari Parrillo S [facility]: [phone]

## 2013-12-26 NOTE — Telephone Encounter (Signed)
Pt called to let Dr. Mauricio PoBreen know that his BP was 117/58. jw

## 2014-01-15 ENCOUNTER — Ambulatory Visit (INDEPENDENT_AMBULATORY_CARE_PROVIDER_SITE_OTHER): Payer: Medicaid Other | Admitting: Family Medicine

## 2014-01-15 ENCOUNTER — Telehealth: Payer: Self-pay | Admitting: Family Medicine

## 2014-01-15 VITALS — BP 97/47 | HR 76 | Temp 98.1°F

## 2014-01-15 DIAGNOSIS — H578 Other specified disorders of eye and adnexa: Secondary | ICD-10-CM

## 2014-01-15 DIAGNOSIS — R51 Headache: Secondary | ICD-10-CM

## 2014-01-15 DIAGNOSIS — E1165 Type 2 diabetes mellitus with hyperglycemia: Secondary | ICD-10-CM

## 2014-01-15 DIAGNOSIS — R519 Headache, unspecified: Secondary | ICD-10-CM

## 2014-01-15 DIAGNOSIS — H5789 Other specified disorders of eye and adnexa: Secondary | ICD-10-CM | POA: Insufficient documentation

## 2014-01-15 DIAGNOSIS — IMO0002 Reserved for concepts with insufficient information to code with codable children: Secondary | ICD-10-CM

## 2014-01-15 LAB — POCT GLYCOSYLATED HEMOGLOBIN (HGB A1C): HEMOGLOBIN A1C: 7.1

## 2014-01-15 LAB — POCT SEDIMENTATION RATE: POCT SED RATE: 66 mm/hr — AB (ref 0–22)

## 2014-01-15 MED ORDER — INSULIN GLARGINE 100 UNIT/ML SOLOSTAR PEN: 20.0000 [IU] | PEN_INJECTOR | Freq: Every day | SUBCUTANEOUS | Status: DC

## 2014-01-15 NOTE — Patient Instructions (Addendum)
It was a pleasure to see you today.  Regarding the right-sided headache and eye pain/visual disturbance, it is crucial that you see Dr Luciana Axeankin as soon as possible. We checked an inflammatory marker called a Sed Rate today, and i WILL CALL [phone] WITH THE RESULTS:   I would like to have you back on December 18th for follow up with me. SCHEDULE ON DR Nolon [facility]BREEN'S [facility]

## 2014-01-15 NOTE — Telephone Encounter (Signed)
Pt called and wanted Dr. Mauricio PoBreen to know that he could not get to the appointment with Dr. Luciana Axeankin today. jw

## 2014-01-15 NOTE — Progress Notes (Signed)
   Subjective:    Patient ID: Lawrence CalicoMark J [name], male    DOB: December 21, 1960, [age] y.o.   MRN: [id number]  HPI Loraine LericheMark comes in for complaint of right eye pain and redness over the past 2-3 weeks; as well as right-sided headache and decreased visual acuity. He has had daily episodes of nausea/vomiting as well. Says that he has episodes of severe stabbing pain in the right eye, which intensifies the headache. Associated photophobia but no photopsia. No amaurosis fugax description. No motor changes, no dysarthria. No left leg weakness (he is s/p R BKA).  No hypoglycemic events.   Denies fevers/chills.  He has an established appointment with his ophthalmologist Dr Luciana Axeankin for tomorrow morning.    Review of Systems     Objective:   Physical Exam Iin wheelchair, no apparent distress. Comes in with thick sunglasses.  HEENT: R red eye; mid-sized pupil and cloudy cornea. Pupil with minimal response to direct light. EOMI. Left eye responds to light; not red.  Sensation in all three branches CN-V intact.  Tongue midline; shoulder shrug symmetric. Speech fluid.  No tenderness over temporal arteries bilaterally. Neck supple.         Assessment & Plan:

## 2014-01-16 ENCOUNTER — Telehealth: Payer: Self-pay | Admitting: Family Medicine

## 2014-01-16 NOTE — Telephone Encounter (Signed)
Pt called and wanted to ask Dr. Mauricio PoBreen does he need to keep the appointment on 12/18? Please let him know. jw

## 2014-01-17 ENCOUNTER — Encounter (HOSPITAL_COMMUNITY): Payer: Self-pay | Admitting: Vascular Surgery

## 2014-01-17 ENCOUNTER — Telehealth: Payer: Self-pay | Admitting: *Deleted

## 2014-01-17 NOTE — Telephone Encounter (Signed)
Called pt, he is in ophtho office now, was seen yesterday and being treated for macular degeneration.  We may defer his visit next Friday unless he has other needs that arise. Will leave on books and he may cancel if not needed before.  JB

## 2014-01-17 NOTE — Telephone Encounter (Signed)
Dr. Fawn KirkGary Rankin (patient's opthalmologist) would like Dr. Mauricio PoBreen to give him a call on his cell at [phone]. Will forward to PCP

## 2014-01-21 NOTE — Telephone Encounter (Signed)
I spoke with Dr Luciana Axeankin, patient with neovascular glaucoma with hemorrhage, for vitrectomy and photocoagulation of peripheral visual field in attempt to salvage his eye and ambulatory vision.  Scheduled for surgery under MAC on Weds.  From a general medical perspective I believe it is reasonable for Lawrence [name] to undergo MAC in the interest of preserving his eye. JB

## 2014-01-25 ENCOUNTER — Ambulatory Visit: Payer: Medicaid Other | Admitting: Family Medicine

## 2014-02-15 ENCOUNTER — Ambulatory Visit: Payer: Medicaid Other | Admitting: Family Medicine

## 2014-02-15 ENCOUNTER — Telehealth: Payer: Self-pay | Admitting: Family Medicine

## 2014-02-15 NOTE — Telephone Encounter (Signed)
Pt called to let us know that he is now using [facility] and we will be receiving faxes from them in the future for all his medications. jw

## 2014-02-15 NOTE — Telephone Encounter (Signed)
FYI to PCP

## 2014-02-21 ENCOUNTER — Other Ambulatory Visit: Payer: Self-pay | Admitting: Family Medicine

## 2014-03-05 ENCOUNTER — Ambulatory Visit: Payer: Medicaid Other | Admitting: Family Medicine

## 2014-03-15 ENCOUNTER — Ambulatory Visit (INDEPENDENT_AMBULATORY_CARE_PROVIDER_SITE_OTHER): Payer: Medicaid Other | Admitting: Family Medicine

## 2014-03-15 ENCOUNTER — Encounter: Payer: Self-pay | Admitting: Family Medicine

## 2014-03-15 VITALS — BP 123/70 | HR 71 | Temp 98.9°F | Wt 173.1 lb

## 2014-03-15 DIAGNOSIS — I10 Essential (primary) hypertension: Secondary | ICD-10-CM

## 2014-03-15 DIAGNOSIS — E1165 Type 2 diabetes mellitus with hyperglycemia: Secondary | ICD-10-CM

## 2014-03-15 DIAGNOSIS — IMO0002 Reserved for concepts with insufficient information to code with codable children: Secondary | ICD-10-CM

## 2014-03-15 NOTE — Patient Instructions (Signed)
It was a pleasure to see you today. I am pleased to hear that the kidney function is improving, and that Dr Luciana Axeankin is following your right eye.  I would like to see you back in March, to recheck your hemoglobin A1C.  FOLLOW UP IN 1-2 MONTHS FOR DM FOLLOWUP.

## 2014-03-15 NOTE — Progress Notes (Signed)
   Subjective:    Patient ID: Lawrence CalicoMark J [name], male    DOB: 1960-11-15, 54 y.o.   MRN: [id number]  HPI Loraine LericheMark is here for follow up.  He reports that he continues to be followed by Dr Luciana Axeankin for his right eye, has undergone 2 procedures and is expected to regain some vision from the eye (reports no sight from the right eye now). Does not have pain from the eye any longer.  He reports no decreased vision or pain in the left eye.   Continues with some intermittent urethral pain after emptying the suprapubic catheter. He had been voiding regularly but then resorted back to using the suprapubic catheter. Has an appointment with Dr Brunilda PayorNesi, his urologist, on Monday Feb 8th. Uses the oxybutynin occasionally with some relief of the pain. Says urine appears strong and concentrated, may have seen blood in urine.   Does not check his CBGs at home, but reports he has been using the Lantus 20 units nightly as prescribed. No sxs hypoglycemia.   Continues to be followed for his CKD IV by Dr Eliott Nineunham, whom he saw most recently in January.  He reports that she did labs then and he has improved his GFR from 19% to 27%.  Also that his Hgb was 11 at that time. She is managing his lasix and calcitriol.   Has had difficulty hearing out of either ear.  Is scheduling to see an ENT recommended to him by his ophthalmologist. Worse when he has a cold and head congestion, but he does not feel like he has a cold now.   Social Hx reviewed and no changes.  Patient reports that he is expected to lose his Medicaid benefits on March 1st, is unsure of what he'll do.  He has a Clinical research associatelawyer working with him.  Review of Systems No fevers/chills, no shortness of breath, no chest pain. No nausea/vomiting. No lower extremity edema in left leg (s/p right BKA).     Objective:   Physical Exam Well appearing, sallow, no apparent distress HEENT neck supple. Moist mucus membranes COR Regular S1S2 PULM Clear bilaterally, no rales or wheezees Left leg  without peripheral edema.        Assessment & Plan:

## 2014-03-15 NOTE — Assessment & Plan Note (Signed)
Within target range today, controlled. He continues to take Lasix 40mg  twice daily. Monitored by his nephrologist, Dr Eliott Nineunham.

## 2014-03-15 NOTE — Assessment & Plan Note (Signed)
Last A1C in December 2015, continues to use Lantus 20 units nightly. No sxs hypoglycemia. Does not check CBGs. Repeat A1C here in 2 months.

## 2014-03-21 ENCOUNTER — Telehealth: Payer: Self-pay | Admitting: Family Medicine

## 2014-03-21 NOTE — Telephone Encounter (Signed)
Pt asking for a letter stating he needs his medication and needs to have regular f/u appts or it could be life threatening. Says medicaid is due to be terminated on 04/09/14 unless he can pay a $5000 deductible, pt cannot afford that and must have medicaid since he is on insulin.

## 2014-03-22 NOTE — Telephone Encounter (Signed)
Letter written on patient's behalf.  Letter to be given/mailed to patient, so that he may distribute to whomever requires it.   JB

## 2014-03-22 NOTE — Telephone Encounter (Signed)
Patient informed that letter has been written, requests that I mail it to him. Letter mailed.

## 2014-04-12 ENCOUNTER — Ambulatory Visit: Payer: Medicaid Other | Admitting: Family Medicine

## 2014-04-19 ENCOUNTER — Encounter: Payer: Self-pay | Admitting: Family Medicine

## 2014-04-19 ENCOUNTER — Ambulatory Visit (INDEPENDENT_AMBULATORY_CARE_PROVIDER_SITE_OTHER): Payer: Self-pay | Admitting: Family Medicine

## 2014-04-19 VITALS — BP 140/60 | HR 68 | Temp 97.7°F | Ht 65.0 in

## 2014-04-19 DIAGNOSIS — E119 Type 2 diabetes mellitus without complications: Secondary | ICD-10-CM

## 2014-04-19 DIAGNOSIS — IMO0002 Reserved for concepts with insufficient information to code with codable children: Secondary | ICD-10-CM

## 2014-04-19 DIAGNOSIS — E1165 Type 2 diabetes mellitus with hyperglycemia: Secondary | ICD-10-CM

## 2014-04-19 DIAGNOSIS — I10 Essential (primary) hypertension: Secondary | ICD-10-CM

## 2014-04-19 LAB — POCT GLYCOSYLATED HEMOGLOBIN (HGB A1C): Hemoglobin A1C: 7.1

## 2014-04-19 MED ORDER — INSULIN GLARGINE 100 UNIT/ML SOLOSTAR PEN: PEN_INJECTOR | SUBCUTANEOUS | Status: DC

## 2014-04-19 NOTE — Assessment & Plan Note (Signed)
Admits to being unable to afford his Lantus, has not been using regularly.  Had a Medicaid hearing 2 weeks ago and has not heard news about the outcome. We began to discuss the option of transitioning to a less expensive insulin like 70/30; he reports [facility], was deemed 'disabled' in 2014 and believes he will have easier access to his meds at that time.

## 2014-04-19 NOTE — Progress Notes (Signed)
   Subjective:    Patient ID: Lawrence [name], male    DOB: [birth year], 54 y.o.   MRN: [id number]  HPI Lawrence [name] is here for follow up.  He is due for A1C check today. Reports he has not been compliant with his Lantus 20 units at bedtime, unable to afford it. No longer on Medicaid; had a hearing for Medicaid 2 weeks ago but has not heard back.  Says he had been deemed 'disabled' in 2014, will be eligible for Medicare in July of this year.   Continues to follow with Dr Eliott Nineunham for Nephrology.  He says his most recent GFR was 23%.  No immediate plans for HD. Has fistula in right UE.   Reports no further urethral symptoms. Not taking oxybutynin.   Continues to follow with Dr Luciana Axeankin.  Right eye vision is coming back slowly. No longer painful.   At end of visit reports some fullness and discomfort in his inner ears, down to his throat. Was taking sudafed a week ago, none recently. Did not help. No tinnitus. No otorrhea.    Review of Systems No cough, no shortness of breath. No fevers or chills.     Objective:   Physical Exam Well appearing, no apparent distress.  In wheelchair.  No redness of either eye. EOMI.  Neck supple. TMs clear bilaterally with some mild wax in EAC bilaterally. No redness of TMs. Good cones of light.         Assessment & Plan:

## 2014-04-19 NOTE — Assessment & Plan Note (Signed)
Patient with diagnosis of hypertension, has had antihypertensives held recently with low-normotensive BP readings. Today's reading @140s  systolic; difficult to know whether this is an outlier or a return to his prior elevated BP readings. Plan to recheck in the coming 2 weeks before decision whether to restart his amlodipine.  His pulse is hovering around 60 and may not tolerate a standing increase in his BB.

## 2014-04-19 NOTE — Patient Instructions (Addendum)
It was a pleasure to see you today.   We are rechecking your blood pressure in 2-3 weeks and I will see you then; decision about whether to restart amlodipine at that time.   Pen Lantus 20 units nightly; we can discuss possibility of changing to 70/30 insulin at that time.

## 2014-04-19 NOTE — Addendum Note (Signed)
Addended by: Lamonte SakaiZIMMERMAN RUMPLE, APRIL D on: 04/19/2014 02:42 PM   Modules accepted: Orders

## 2014-05-02 LAB — HM DIABETES EYE EXAM

## 2014-05-05 ENCOUNTER — Emergency Department (HOSPITAL_COMMUNITY): Payer: Self-pay

## 2014-05-05 ENCOUNTER — Emergency Department (HOSPITAL_COMMUNITY)
Admission: EM | Admit: 2014-05-05 | Discharge: 2014-05-05 | Disposition: A | Discharge status: Home / Self Care | Payer: Self-pay | Attending: Emergency Medicine | Admitting: Emergency Medicine

## 2014-05-05 DIAGNOSIS — E119 Type 2 diabetes mellitus without complications: Secondary | ICD-10-CM | POA: Insufficient documentation

## 2014-05-05 DIAGNOSIS — Y846 Urinary catheterization as the cause of abnormal reaction of the patient, or of later complication, without mention of misadventure at the time of the procedure: Secondary | ICD-10-CM | POA: Insufficient documentation

## 2014-05-05 DIAGNOSIS — E780 Pure hypercholesterolemia: Secondary | ICD-10-CM | POA: Insufficient documentation

## 2014-05-05 DIAGNOSIS — T83511A Infection and inflammatory reaction due to indwelling urethral catheter, initial encounter: Secondary | ICD-10-CM

## 2014-05-05 DIAGNOSIS — Z8701 Personal history of pneumonia (recurrent): Secondary | ICD-10-CM | POA: Insufficient documentation

## 2014-05-05 DIAGNOSIS — R509 Fever, unspecified: Secondary | ICD-10-CM

## 2014-05-05 DIAGNOSIS — Z8659 Personal history of other mental and behavioral disorders: Secondary | ICD-10-CM | POA: Insufficient documentation

## 2014-05-05 DIAGNOSIS — I129 Hypertensive chronic kidney disease with stage 1 through stage 4 chronic kidney disease, or unspecified chronic kidney disease: Secondary | ICD-10-CM | POA: Insufficient documentation

## 2014-05-05 DIAGNOSIS — Z79899 Other long term (current) drug therapy: Secondary | ICD-10-CM | POA: Insufficient documentation

## 2014-05-05 DIAGNOSIS — Z8719 Personal history of other diseases of the digestive system: Secondary | ICD-10-CM | POA: Insufficient documentation

## 2014-05-05 DIAGNOSIS — N390 Urinary tract infection, site not specified: Secondary | ICD-10-CM

## 2014-05-05 DIAGNOSIS — Z794 Long term (current) use of insulin: Secondary | ICD-10-CM | POA: Insufficient documentation

## 2014-05-05 DIAGNOSIS — N189 Chronic kidney disease, unspecified: Secondary | ICD-10-CM | POA: Insufficient documentation

## 2014-05-05 DIAGNOSIS — T8351XA Infection and inflammatory reaction due to indwelling urinary catheter, initial encounter: Secondary | ICD-10-CM | POA: Insufficient documentation

## 2014-05-05 DIAGNOSIS — Z8739 Personal history of other diseases of the musculoskeletal system and connective tissue: Secondary | ICD-10-CM | POA: Insufficient documentation

## 2014-05-05 DIAGNOSIS — Z7982 Long term (current) use of aspirin: Secondary | ICD-10-CM | POA: Insufficient documentation

## 2014-05-05 DIAGNOSIS — Z89511 Acquired absence of right leg below knee: Secondary | ICD-10-CM | POA: Insufficient documentation

## 2014-05-05 LAB — CBC WITH DIFFERENTIAL/PLATELET
BASOS PCT: 0 % (ref 0–1)
Basophils Absolute: 0 10*3/uL (ref 0.0–0.1)
Eosinophils Absolute: 0 10*3/uL (ref 0.0–0.7)
Eosinophils Relative: 0 % (ref 0–5)
HCT: 27.5 % — ABNORMAL LOW (ref 39.0–52.0)
HEMOGLOBIN: 9.3 g/dL — AB (ref 13.0–17.0)
LYMPHS ABS: 1 10*3/uL (ref 0.7–4.0)
LYMPHS PCT: 9 % — AB (ref 12–46)
MCH: 29.2 pg (ref 26.0–34.0)
MCHC: 33.8 g/dL (ref 30.0–36.0)
MCV: 86.5 fL (ref 78.0–100.0)
MONO ABS: 0.8 10*3/uL (ref 0.1–1.0)
Monocytes Relative: 8 % (ref 3–12)
Neutro Abs: 9.1 10*3/uL — ABNORMAL HIGH (ref 1.7–7.7)
Neutrophils Relative %: 83 % — ABNORMAL HIGH (ref 43–77)
Platelets: 123 10*3/uL — ABNORMAL LOW (ref 150–400)
RBC: 3.18 MIL/uL — ABNORMAL LOW (ref 4.22–5.81)
RDW: 12.7 % (ref 11.5–15.5)
WBC: 10.9 10*3/uL — ABNORMAL HIGH (ref 4.0–10.5)

## 2014-05-05 LAB — URINALYSIS, ROUTINE W REFLEX MICROSCOPIC
Bilirubin Urine: NEGATIVE
Glucose, UA: 500 mg/dL — AB
Ketones, ur: NEGATIVE mg/dL
Nitrite: NEGATIVE
Protein, ur: 100 mg/dL — AB
SPECIFIC GRAVITY, URINE: 1.014 (ref 1.005–1.030)
Urobilinogen, UA: 0.2 mg/dL (ref 0.0–1.0)
pH: 5 (ref 5.0–8.0)

## 2014-05-05 LAB — I-STAT CG4 LACTIC ACID, ED
LACTIC ACID, VENOUS: 1.01 mmol/L (ref 0.5–2.0)
Lactic Acid, Venous: 2.02 mmol/L (ref 0.5–2.0)

## 2014-05-05 LAB — URINE MICROSCOPIC-ADD ON

## 2014-05-05 LAB — BASIC METABOLIC PANEL
ANION GAP: 13 (ref 5–15)
BUN: 91 mg/dL — AB (ref 6–23)
CO2: 22 mmol/L (ref 19–32)
Calcium: 9.4 mg/dL (ref 8.4–10.5)
Chloride: 102 mmol/L (ref 96–112)
Creatinine, Ser: 2.93 mg/dL — ABNORMAL HIGH (ref 0.50–1.35)
GFR calc Af Amer: 27 mL/min — ABNORMAL LOW (ref >90)
GFR, EST NON AFRICAN AMERICAN: 23 mL/min — AB (ref >90)
Glucose, Bld: 278 mg/dL — ABNORMAL HIGH (ref 70–99)
Potassium: 3.6 mmol/L (ref 3.5–5.1)
Sodium: 137 mmol/L (ref 135–145)

## 2014-05-05 MED ORDER — CIPROFLOXACIN HCL 500 MG PO TABS
500.0000 mg | ORAL_TABLET | Freq: Two times a day (BID) | ORAL | Status: DC
  Administered 2014-05-05: 500 mg via ORAL
  Filled 2014-05-05: qty 1

## 2014-05-05 MED ORDER — CIPROFLOXACIN HCL 500 MG PO TABS: 500.0000 mg | ORAL_TABLET | Freq: Two times a day (BID) | ORAL | Status: DC

## 2014-05-05 MED ORDER — LIDOCAINE HCL 2 % EX GEL
CUTANEOUS | Status: AC
  Administered 2014-05-05: 05:00:00
  Filled 2014-05-05: qty 10

## 2014-05-05 MED ORDER — ACETAMINOPHEN 500 MG PO TABS
1000.0000 mg | ORAL_TABLET | Freq: Once | ORAL | Status: AC
  Administered 2014-05-05: 1000 mg via ORAL
  Filled 2014-05-05: qty 2

## 2014-05-05 NOTE — ED Provider Notes (Signed)
CSN: [id number]     Arrival date & time 05/05/14  0215 History   First MD Initiated Contact with Patient 05/05/14 0248     Chief Complaint  Patient presents with  . Urinary Frequency     (Consider location/radiation/quality/duration/timing/severity/associated sxs/prior Treatment) HPI  This is a 54 year old male status post right BKA for osteomyelitis. He also has a "nonfunctioning" bladder and has a indwelling suprapubic catheter. He is due to have the catheter changed on April 13. He is here with a three-day history of increased urinary frequency. He keeps his catheter plugged and only empties his bladder as needed; he does not keep it attached to a bag. He has needed to urinate about every 30 minutes. He has also felt general malaise and fatigue and was noted to have a fever of 102.3 on arrival. He denies nausea, vomiting or diarrhea. He denies chills. He has had a productive cough.  Past Medical History  Diagnosis Date  . Hypertension   . Chronic kidney disease   . Hypercholesteremia   . PONV (postoperative nausea and vomiting)   . Peripheral vascular disease   . H/O hiatal hernia     hx of years ago   . Pneumonia 10/23/2013  . IDDM (insulin dependent diabetes mellitus)   . Depression 08/2012    "just when foot first got cut off"   Past Surgical History  Procedure Laterality Date  . Amputation Right 05/03/2012    Procedure: Right First Ray AMPUTATION;  Surgeon: Eldred MangesMark C Yates, MD;  [facility];  Service: Orthopedics;  Laterality: Right;  . I&d extremity Right 05/29/2012    Procedure: IRRIGATION AND DEBRIDEMENT EXTREMITY- right foot;  Surgeon: Eldred MangesMark C Yates, MD;  Location: [facility] OR;  Service: Orthopedics;  Laterality: Right;  Right Foot Debridement, VAC Application  . Amputation Right 08/16/2012    Procedure: AMPUTATION BELOW KNEE;  Surgeon: Eldred MangesMark C Yates, MD;  [facility];  Service: Orthopedics;  Laterality: Right;  Right Below Knee Amputation  . Amputation Right 10/04/2012   Procedure: AMPUTATION BELOW KNEE REVISION;  Surgeon: Eldred MangesMark C Yates, MD;  Location: [facility] OR;  Service: Orthopedics;  Laterality: Right;  . Insertion of suprapubic catheter N/A 03/02/2013    Procedure: INSERTION OF SUPRAPUBIC CATHETER;  Surgeon: Su GrandMarc Nesi, MD;  Location: [facility] ORS;  Service: Urology;  Laterality: N/A;  . Tonsillectomy  ~ 1967  . Av fistula placement Right 11/01/2013    Procedure: RIGHT ARM ARTERIOVENOUS (AV) FISTULA CREATION;  Surgeon: Chuck Hinthristopher S Dickson, MD;  [facility];  Service: Vascular;  Laterality: Right;  . Abdominal aortagram N/A 05/03/2012    Procedure: ABDOMINAL Ronny FlurryAORTAGRAM;  Surgeon: Sherren Kernsharles E Fields, MD;  [facility];  Service: Cardiovascular;  Laterality: N/A;  . Lower extremity angiogram Right 05/03/2012    Procedure: LOWER EXTREMITY ANGIOGRAM;  Surgeon: Sherren Kernsharles E Fields, MD;  [facility];  Service: Cardiovascular;  Laterality: Right;  rt leg angio   No family history on file. History  Substance Use Topics  . Smoking status: Never Smoker   . Smokeless tobacco: Never Used  . Alcohol Use: Yes     Comment: 10/23/2013 "couple times/month I'll have a beer"    Review of Systems  All other systems reviewed and are negative.   Allergies  Review of patient's allergies indicates no known allergies.  Home Medications   Prior to Admission medications   Medication Sig Start Date End Date Taking? Authorizing Provider  aspirin 81 MG tablet Take 81 mg by  mouth daily.    Yes Historical Provider, MD  atorvastatin (LIPITOR) 40 MG tablet Take 1 tablet (40 mg total) by mouth daily. 11/27/13  Yes [name], MD  Brinzolamide-Brimonidine [facility]) 1-0.2 % SUSP Place 1 drop into the left eye 2 (two) times daily.   Yes Historical Provider, MD  calcitRIOL (ROCALTROL) 0.25 MCG capsule Take 1 capsule (0.25 mcg total) by mouth daily. 11/01/13  Yes Ardith Dark, MD  furosemide (LASIX) 40 MG tablet Take 1 tablet (40 mg total) by mouth 2 (two) times daily. 11/01/13   Yes Ardith Dark, MD  Insulin Glargine (LANTUS SOLOSTAR) 100 UNIT/ML Solostar Pen Inject 20 Units into the skin daily at 10 pm. 01/15/14  Yes [name], MD  metoprolol tartrate (LOPRESSOR) 25 MG tablet Take 25 mg by mouth 2 (two) times daily.   Yes Historical Provider, MD  oxybutynin (DITROPAN) 5 MG tablet Take 1 tablet (5 mg total) by mouth every 8 (eight) hours as needed for bladder spasms. 05/18/13  Yes Marisa Severin, MD  Insulin Glargine (LANTUS SOLOSTAR) 100 UNIT/ML Solostar Pen Use as directed. Patient not taking: Reported on 05/05/2014 04/19/14   [name], MD  metoprolol tartrate (LOPRESSOR) 25 MG tablet Take 1 tablet (25 mg total) by mouth 2 (two) times daily. Patient not taking: Reported on 05/05/2014 05/04/13   [name], MD  Stann Ore INSULIN SYRINGE 30G X 1/2" 0.3 ML MISC USE ONE SYRINGE AT BEDTIME 11/15/13   [name], MD   BP 107/43 mmHg  Pulse 88  Temp(Src) 99.9 F (37.7 C) (Oral)  Resp 16  SpO2 97%   Physical Exam  General: Well-developed, well-nourished male in no acute distress; appearance consistent with age of record HENT: normocephalic; atraumatic Eyes: Left pupil round and reactive to light; right pupil round and nonreactive; extraocular muscles intact; lens implants Neck: supple Heart: regular rate and rhythm Lungs: clear to auscultation bilaterally Abdomen: soft; nondistended; nontender; no masses or hepatosplenomegaly; bowel sounds present; suprapubic Foley catheter Extremities: Right BKA Neurologic: Awake, alert and oriented; motor function intact in all extremities and symmetric; no facial droop Skin: Warm and dry Psychiatric: Normal mood and affect    ED Course  Procedures (including critical care time)  SUPRAPUBIC CATHETER CHANGE After informed verbal consent was obtained the patient's indwelling Foley was removed after deflating the balloon. The urostomy site was prepped with Betadine and sterile 2% lidocaine jelly. An 18-gauge Foley was  inserted into the urostomy using sterile technique. The balloon was then inflated. The patient tolerated this well and there were no immediate complications.  MDM  Nursing notes and vitals signs, including pulse oximetry, reviewed.  Summary of this visit's results, reviewed by myself:  Labs:  Results for orders placed or performed during the hospital encounter of 05/05/14 (from the past 24 hour(s))  CBC with Differential/Platelet     Status: Abnormal   Collection Time: 05/05/14  2:48 AM  Result Value Ref Range   WBC 10.9 (H) 4.0 - 10.5 K/uL   RBC 3.18 (L) 4.22 - 5.81 MIL/uL   Hemoglobin 9.3 (L) 13.0 - 17.0 g/dL   HCT 91.4 (L) 78.2 - 95.6 %   MCV 86.5 78.0 - 100.0 fL   MCH 29.2 26.0 - 34.0 pg   MCHC 33.8 30.0 - 36.0 g/dL   RDW 21.3 08.6 - 57.8 %   Platelets 123 (L) 150 - 400 K/uL   Neutrophils Relative % 83 (H) 43 - 77 %   Neutro Abs  9.1 (H) 1.7 - 7.7 K/uL   Lymphocytes Relative 9 (L) 12 - 46 %   Lymphs Abs 1.0 0.7 - 4.0 K/uL   Monocytes Relative 8 3 - 12 %   Monocytes Absolute 0.8 0.1 - 1.0 K/uL   Eosinophils Relative 0 0 - 5 %   Eosinophils Absolute 0.0 0.0 - 0.7 K/uL   Basophils Relative 0 0 - 1 %   Basophils Absolute 0.0 0.0 - 0.1 K/uL  Basic metabolic panel     Status: Abnormal   Collection Time: 05/05/14  2:48 AM  Result Value Ref Range   Sodium 137 135 - 145 mmol/L   Potassium 3.6 3.5 - 5.1 mmol/L   Chloride 102 96 - 112 mmol/L   CO2 22 19 - 32 mmol/L   Glucose, Bld 278 (H) 70 - 99 mg/dL   BUN 91 (H) 6 - 23 mg/dL   Creatinine, Ser 1.61 (H) 0.50 - 1.35 mg/dL   Calcium 9.4 8.4 - 09.6 mg/dL   GFR calc non Af Amer 23 (L) >90 mL/min   GFR calc Af Amer 27 (L) >90 mL/min   Anion gap 13 5 - 15  I-Stat CG4 Lactic Acid, ED     Status: None   Collection Time: 05/05/14  4:07 AM  Result Value Ref Range   Lactic Acid, Venous 1.01 0.5 - 2.0 mmol/L  Urinalysis, Routine w reflex microscopic     Status: Abnormal   Collection Time: 05/05/14  5:23 AM  Result Value Ref Range    Color, Urine YELLOW YELLOW   APPearance CLOUDY (A) CLEAR   Specific Gravity, Urine 1.014 1.005 - 1.030   pH 5.0 5.0 - 8.0   Glucose, UA 500 (A) NEGATIVE mg/dL   Hgb urine dipstick MODERATE (A) NEGATIVE   Bilirubin Urine NEGATIVE NEGATIVE   Ketones, ur NEGATIVE NEGATIVE mg/dL   Protein, ur 045 (A) NEGATIVE mg/dL   Urobilinogen, UA 0.2 0.0 - 1.0 mg/dL   Nitrite NEGATIVE NEGATIVE   Leukocytes, UA SMALL (A) NEGATIVE  Urine microscopic-add on     Status: Abnormal   Collection Time: 05/05/14  5:23 AM  Result Value Ref Range   Squamous Epithelial / LPF RARE RARE   WBC, UA 21-50 <3 WBC/hpf   RBC / HPF 0-2 <3 RBC/hpf   Bacteria, UA MANY (A) RARE   Urine-Other AMORPHOUS URATES/PHOSPHATES     Imaging Studies: Dg Chest 2 View  05/05/2014   CLINICAL DATA:  Fatigue, fever, cough for 1 day.  EXAM: CHEST  2 VIEW  COMPARISON:  10/26/2013  FINDINGS: Small bilateral pleural effusions, improved from prior exam. Diminished associated bibasilar opacities. Heart is at the upper limits of normal in size. Pulmonary vasculature is normal. No new airspace disease. No pneumothorax. No acute osseous abnormalities are seen.  IMPRESSION: Bilateral pleural effusions, diminished from prior exam. Diminished associated bibasilar opacities, likely atelectasis. No new airspace disease.   Electronically Signed   By: Rubye Oaks M.D.   On: 05/05/2014 03:19       Paula Libra, MD 05/05/14 [phone]

## 2014-05-05 NOTE — ED Notes (Signed)
Pt resting quitely.  NAD.

## 2014-05-05 NOTE — ED Notes (Signed)
Bed: WA09 Expected date:  Expected time:  Means of arrival:  Comments: EMS 

## 2014-05-11 LAB — CULTURE, BLOOD (ROUTINE X 2)
Culture: NO GROWTH
Culture: NO GROWTH

## 2014-05-17 ENCOUNTER — Encounter: Payer: Self-pay | Admitting: Family Medicine

## 2014-05-17 ENCOUNTER — Ambulatory Visit (INDEPENDENT_AMBULATORY_CARE_PROVIDER_SITE_OTHER): Payer: Self-pay | Admitting: Family Medicine

## 2014-05-17 VITALS — BP 126/70 | HR 70 | Temp 97.5°F

## 2014-05-17 DIAGNOSIS — E1165 Type 2 diabetes mellitus with hyperglycemia: Secondary | ICD-10-CM

## 2014-05-17 DIAGNOSIS — IMO0002 Reserved for concepts with insufficient information to code with codable children: Secondary | ICD-10-CM

## 2014-05-17 NOTE — Patient Instructions (Addendum)
It was a pleasure to see you today.  I am glad your bladder issues are resolved.    As far as the Lantus insulin goes, please call back periodically to see if we have received more samples.    I would recommend re-contacting the MAP program regarding Lantus from the MAP program.

## 2014-05-17 NOTE — Addendum Note (Signed)
Addended by: Barbaraann BarthelBREEN, Keneisha Heckart O on: 05/17/2014 01:46 PM   Modules accepted: Level of Service

## 2014-05-17 NOTE — Assessment & Plan Note (Signed)
Difficulty maintaining supply of Lantus insulin, which he uses 20 units at bedtime every night.  Has relied on samples from our sample fridge lately, tells me that he spoke with the MAP program at [facility] and was told he did not qualify due to "too much income".  Lawrence [name] is unemployed and receives disability checks.  He is due to begin receiving Medicare on July 1st.  He does not have Medicaid or private insurance.   We are out of Lantus samples today; I have asked him to contact the practice in the coming weeks to see if we have more samples.  I find it unusual that he does not qualify for MAP program, as [place] in [place] and is unemployed, only income from disability checks.  For CSW to consider other options to obtain insulin.

## 2014-05-17 NOTE — Progress Notes (Signed)
   Subjective:    Patient ID: Scarlette CalicoMark J Caissie, male    DOB: 09-08-1960, 54 y.o.   MRN: [id number]  HPI Loraine LericheMark is seen today for hospital follow up; he was in the ED on March 27th with increased urinary frequency through his suprapubic indwelling catheter, also "shakes", noted to have a temp 102.7F on arrival at ED.  Had elevated lactic acid (2.02) which corrected; mildly elevated WBC (10), and blood cultures drawn which were negative.  Suprapubic catheter was changed in ED>  He has appt with Dr Madilyn HookNesi's office on April 22nd for another catheter change.  He has not had recurring fevers, bladder spasms, urinary frequency since then. Took Cipro 500mg  bid x 7 days.  He has DM, has been using his Lantus 20 units at bedtime regularly. Has had trouble accessing the insulin, tells me he has spoken with MAP program and does not qualify "due to income".  He remains unemployed and expects to get Medicare on July 1st.  He asks for samples of Lantus today, of which we have none.   ROS: No fevers or chills, no bladder spasms.    Review of Systems     Objective:   Physical Exam Generally well-appearing, no apparent distress Sallow appearance       Assessment & Plan:

## 2014-05-20 ENCOUNTER — Telehealth: Payer: Self-pay | Admitting: Family Medicine

## 2014-05-20 NOTE — Telephone Encounter (Signed)
Pt called and was checking the status of some samples of his insulin. He was told that we were waiting on it to come in the mail.jw

## 2014-05-20 NOTE — Telephone Encounter (Signed)
I will forward this to Juanita CraverStacey Karl who said she can call the patient to figure out this situation.  We still don't have any Lantus samples.  We'll try to get him scheduled in pharmacy clinic.

## 2014-05-20 NOTE — Telephone Encounter (Signed)
Lawrence [name] -- do you know when we'll have more insulin samples come in?

## 2014-05-20 NOTE — Telephone Encounter (Signed)
I called Lawrence [name] and told him that we were planning on getting Levemir samples later this week.  He was not interested in switching from Lantus to Levemir at this time.  I told him to contact the practice if he changes his mind.  He reports that he still has Lantus, probably enough to last him the rest of the week. Encouraged patient to think about getting Levemir as we will not receive any Lantus samples any time soon that we are aware of.

## 2014-05-20 NOTE — Telephone Encounter (Signed)
No we do not have any.  I'm sorry, I thought I put that in the message when I sent it. Lawrence [name], Lawrence [name]

## 2014-05-22 ENCOUNTER — Telehealth: Payer: Self-pay | Admitting: Family Medicine

## 2014-05-22 NOTE — Telephone Encounter (Signed)
Pt called and needs a list printed out stating what medications he takes and how often. He would like us to mil this to his home. The address in Epic is correct. jw

## 2014-05-22 NOTE — Telephone Encounter (Signed)
Medication list printed and mailed to patient. [name],CMA

## 2014-05-27 ENCOUNTER — Telehealth: Payer: Self-pay | Admitting: Family Medicine

## 2014-05-27 NOTE — Telephone Encounter (Signed)
Needs a statement for [facility] stating that it is medically necessary to take Lantus and assistance in affordability is needed. Please send to Mr. Winters's home address / thanks HoneywellSadie Reynolds, ASA

## 2014-05-30 ENCOUNTER — Encounter: Payer: Self-pay | Admitting: Family Medicine

## 2014-05-30 NOTE — Telephone Encounter (Signed)
Letter written and forwarded to front office staff

## 2014-06-05 ENCOUNTER — Telehealth: Payer: Self-pay | Admitting: Family Medicine

## 2014-06-05 NOTE — Telephone Encounter (Signed)
Would like to know if we have any insulin samples to provide for him, as he is out. Please call the patient and inform him if this is possible or not / thanks HoneywellSadie Reynolds, ASA

## 2014-06-06 NOTE — Telephone Encounter (Signed)
LVM for patient to call back to inform him that I have spoke with Dr. Gwendolyn GrantWalden and Paulino RilyPete Koval about this. I have a one time free voucher for the Lantus SoloSTAR pen for patient to pick up. No samples in office.

## 2014-06-11 NOTE — Telephone Encounter (Signed)
LMOVM again.  Will await call back. Fleeger, Jessica Dawn  

## 2014-07-04 ENCOUNTER — Other Ambulatory Visit: Payer: Self-pay | Admitting: Family Medicine

## 2014-07-05 MED ORDER — INSULIN DETEMIR 100 UNIT/ML ~~LOC~~ SOLN: 10.0000 [IU] | Freq: Two times a day (BID) | SUBCUTANEOUS | Status: DC

## 2014-07-05 NOTE — Telephone Encounter (Signed)
Contacted pt to inform him of below. Lamonte SakaiZimmerman Rumple, April D, [place]

## 2014-07-05 NOTE — Telephone Encounter (Signed)
Spoke with pt again to verify that he would have enough insulin to last him until July and he stated that he has a friend that is going to give him some money to pay for it if he gets low.   He stated that he does NOT want to use the levemir, stated that he has taken it before and it made things worse.  Stated that he would like an Rx called in for Levemir.

## 2014-07-05 NOTE — Telephone Encounter (Signed)
I have yet to meet Mr. Speights, he should schedule an appointment in the next month to meet me.  We will be unable to provide him Lantus samples until July (don't have enough).  I've discussed with pharmacy and we can provide him twice daily levemir instead.  10 units in the AM and 10 units in the PM.   Thanks!  JW

## 2014-07-10 MED ORDER — INSULIN GLARGINE 100 UNIT/ML SOLOSTAR PEN: 20.0000 [IU] | PEN_INJECTOR | Freq: Every day | SUBCUTANEOUS | Status: DC

## 2014-08-09 ENCOUNTER — Ambulatory Visit (INDEPENDENT_AMBULATORY_CARE_PROVIDER_SITE_OTHER): Payer: Medicare Other | Admitting: Family Medicine

## 2014-08-09 ENCOUNTER — Encounter: Payer: Self-pay | Admitting: Family Medicine

## 2014-08-09 VITALS — BP 149/60 | HR 78 | Temp 98.1°F

## 2014-08-09 DIAGNOSIS — E1165 Type 2 diabetes mellitus with hyperglycemia: Secondary | ICD-10-CM | POA: Diagnosis not present

## 2014-08-09 DIAGNOSIS — IMO0002 Reserved for concepts with insufficient information to code with codable children: Secondary | ICD-10-CM

## 2014-08-09 DIAGNOSIS — I10 Essential (primary) hypertension: Secondary | ICD-10-CM

## 2014-08-09 DIAGNOSIS — Z9359 Other cystostomy status: Secondary | ICD-10-CM

## 2014-08-09 DIAGNOSIS — I110 Hypertensive heart disease with heart failure: Secondary | ICD-10-CM | POA: Insufficient documentation

## 2014-08-09 DIAGNOSIS — N3289 Other specified disorders of bladder: Secondary | ICD-10-CM

## 2014-08-09 LAB — POCT GLYCOSYLATED HEMOGLOBIN (HGB A1C): HEMOGLOBIN A1C: 8

## 2014-08-09 MED ORDER — HYDROCODONE-ACETAMINOPHEN 5-325 MG PO TABS: 1.0000 | ORAL_TABLET | Freq: Three times a day (TID) | ORAL | Status: DC

## 2014-08-09 NOTE — Patient Instructions (Signed)
Your A1C was up, but I think this was from being out of the insulin.  Vicodin refill today for bladder spasms.  Otherwise I'll see you back in about 3 months unless something comes up.

## 2014-08-09 NOTE — Assessment & Plan Note (Signed)
Treated previously with Norco.   Not a chronic user.   Plan to refill today.   Will sign pain contract if this becomes an issue.

## 2014-08-09 NOTE — Progress Notes (Signed)
Subjective:    Lawrence [name] is a 54 y.o. male who presents to [facility] today for to meet his new doctor.  He currently has no concerns.  He has multiple chronic comorbidites but currently his main active issue is his diabetes.    Diabetes:  Currently on Lantus 20 units daily.  He has had several periods without any insulin over the past few months due to payment issues.  Today is the first day of his receiving Medicare payments.  No adverse effects from medication.  No hypoglycemic events.  No paresthesia or peripheral nerve pain.   Lab Results  Component Value Date   HGBA1C 7.1 04/19/2014   Bladder spasms:  Chronic issue.  Has been treated with vicodin in past.  30 pills lasts him for several months.  Spasms occur mostly at night.  Has suprapubic catheter in place.  No problems with this.   ROS as above per HPI, otherwise neg.    The following portions of the patient's history were reviewed and updated as appropriate: allergies, current medications, past medical history, family and social history, and problem list. Patient is a nonsmoker.    PMH reviewed.  Past Medical History  Diagnosis Date  . Hypertension   . Chronic kidney disease   . Hypercholesteremia   . PONV (postoperative nausea and vomiting)   . Peripheral vascular disease   . H/O hiatal hernia     hx of years ago   . Pneumonia 10/23/2013  . IDDM (insulin dependent diabetes mellitus)   . Depression 08/2012    "just when foot first got cut off"   Past Surgical History  Procedure Laterality Date  . Amputation Right 05/03/2012    Procedure: Right First Ray AMPUTATION;  Surgeon: Eldred Manges, MD;  Location: [facility] OR;  Service: Orthopedics;  Laterality: Right;  . I&d extremity Right 05/29/2012    Procedure: IRRIGATION AND DEBRIDEMENT EXTREMITY- right foot;  Surgeon: Eldred Manges, MD;  Location: [facility] OR;  Service: Orthopedics;  Laterality: Right;  Right Foot Debridement, VAC Application  . Amputation Right 08/16/2012    Procedure:  AMPUTATION BELOW KNEE;  Surgeon: Eldred Manges, MD;  Location: [facility] OR;  Service: Orthopedics;  Laterality: Right;  Right Below Knee Amputation  . Amputation Right 10/04/2012    Procedure: AMPUTATION BELOW KNEE REVISION;  Surgeon: Eldred Manges, MD;  Location: [facility] OR;  Service: Orthopedics;  Laterality: Right;  . Insertion of suprapubic catheter N/A 03/02/2013    Procedure: INSERTION OF SUPRAPUBIC CATHETER;  Surgeon: Su Grand, MD;  Location: [facility] ORS;  Service: Urology;  Laterality: N/A;  . Tonsillectomy  ~ 1967  . Av fistula placement Right 11/01/2013    Procedure: RIGHT ARM ARTERIOVENOUS (AV) FISTULA CREATION;  Surgeon: Chuck Hint, MD;  Location: [facility] OR;  Service: Vascular;  Laterality: Right;  . Abdominal aortagram N/A 05/03/2012    Procedure: ABDOMINAL Ronny Flurry;  Surgeon: Sherren Kerns, MD;  [facility] LAB;  Service: Cardiovascular;  Laterality: N/A;  . Lower extremity angiogram Right 05/03/2012    Procedure: LOWER EXTREMITY ANGIOGRAM;  Surgeon: Sherren Kerns, MD;  Location: [facility] CATH LAB;  Service: Cardiovascular;  Laterality: Right;  rt leg angio    Medications reviewed. Current Outpatient Prescriptions  Medication Sig Dispense Refill  . aspirin 81 MG tablet Take 81 mg by mouth daily.     Marland Kitchen atorvastatin (LIPITOR) 40 MG tablet TAKE 1 TABLET BY MOUTH ONCE DAILY 30 tablet 1  . Brinzolamide-Brimonidine (SIMBRINZA) 1-0.2 %  SUSP Place 1 drop into the left eye 2 (two) times daily.    . calcitRIOL (ROCALTROL) 0.25 MCG capsule Take 1 capsule (0.25 mcg total) by mouth daily. 30 capsule 0  . ciprofloxacin (CIPRO) 500 MG tablet Take 1 tablet (500 mg total) by mouth 2 (two) times daily. One po bid x 7 days 14 tablet 0  . EASY TOUCH PEN NEEDLES 31G X 8 MM MISC USE AS DIRECTED TO INJECT INSULIN. 100 each 1  . furosemide (LASIX) 40 MG tablet TAKE 1 TABLET BY MOUTH TWICE DAILY 30 tablet 1  . insulin detemir (LEVEMIR) 100 UNIT/ML injection Inject 0.1 mLs (10 Units total) into the skin 2 (two)  times daily. 10 mL 1  . Insulin Glargine (LANTUS SOLOSTAR) 100 UNIT/ML Solostar Pen Inject 20 Units into the skin daily at 10 pm. 15 mL 11  . metoprolol tartrate (LOPRESSOR) 25 MG tablet TAKE 1 TABLET BY MOUTH TWICE DAILY 60 tablet 1  . oxybutynin (DITROPAN) 5 MG tablet Take 1 tablet (5 mg total) by mouth every 8 (eight) hours as needed for bladder spasms. 30 tablet 0  . ULTICARE INSULIN SYRINGE 30G X 1/2" 0.3 ML MISC USE ONE SYRINGE AT BEDTIME 100 each 0   No current facility-administered medications for this visit.     Objective:   Physical Exam BP 149/60 mmHg  Pulse 78  Temp(Src) 98.1 F (36.7 C) (Oral) Gen:  Alert, cooperative patient who appears stated age in no acute distress.  Vital signs reviewed. HEENT: EOMI,  MMM.  Right eye red.  Nonreactive to light, hazy appearing.  Cardiac:  Regular rate and rhythm  Pulm:  Clear to auscultation bilaterally with good air movement.  No wheezes or rales noted.   Abd:  Soft/NT.   Skin:  Area around suprapubic cath clean and non-erythematous.  Ext:  Right BKA, well healed.  Left leg thin without edema.   No results found for this or any previous visit (from the past 72 hour(s)).

## 2014-08-09 NOTE — Assessment & Plan Note (Signed)
A1C worse than prior, but he has not been using his Lantus regularly. Think this should normalize now that he has insurance.  FU in 3 months.  Need's microalbumin that visit.

## 2014-08-16 ENCOUNTER — Encounter (HOSPITAL_COMMUNITY)
Admission: RE | Admit: 2014-08-16 | Discharge: 2014-08-16 | Disposition: A | Discharge status: Home / Self Care | Payer: Medicare Other | Source: Ambulatory Visit | Attending: Nephrology | Admitting: Nephrology

## 2014-08-16 DIAGNOSIS — N189 Chronic kidney disease, unspecified: Secondary | ICD-10-CM | POA: Insufficient documentation

## 2014-08-16 DIAGNOSIS — Z79899 Other long term (current) drug therapy: Secondary | ICD-10-CM | POA: Insufficient documentation

## 2014-08-16 DIAGNOSIS — Z5181 Encounter for therapeutic drug level monitoring: Secondary | ICD-10-CM | POA: Insufficient documentation

## 2014-08-16 DIAGNOSIS — D631 Anemia in chronic kidney disease: Secondary | ICD-10-CM | POA: Diagnosis not present

## 2014-08-16 LAB — POCT HEMOGLOBIN-HEMACUE: Hemoglobin: 8.7 g/dL — ABNORMAL LOW (ref 13.0–17.0)

## 2014-08-16 MED ORDER — DARBEPOETIN ALFA 100 MCG/0.5ML IJ SOSY
PREFILLED_SYRINGE | INTRAMUSCULAR | Status: AC
  Administered 2014-08-16: 100 ug
  Filled 2014-08-16: qty 0.5

## 2014-08-16 MED ORDER — DARBEPOETIN ALFA 100 MCG/0.5ML IJ SOSY: 100.0000 ug | PREFILLED_SYRINGE | INTRAMUSCULAR | Status: DC

## 2014-08-16 NOTE — Discharge Instructions (Signed)
Darbepoetin Alfa injection What is this medicine? DARBEPOETIN ALFA (dar be POE e tin AL fa) helps your body make more red blood cells. It is used to treat anemia caused by chronic kidney failure and chemotherapy. This medicine may be used for other purposes; ask your health care provider or pharmacist if you have questions. COMMON BRAND NAME(S): Aranesp What should I tell my health care provider before I take this medicine? They need to know if you have any of these conditions: -blood clotting disorders or history of blood clots -cancer patient not on chemotherapy -cystic fibrosis -heart disease, such as angina, heart failure, or a history of a heart attack -hemoglobin level of 12 g/dL or greater -high blood pressure -low levels of folate, iron, or vitamin B12 -seizures -an unusual or allergic reaction to darbepoetin, erythropoietin, albumin, hamster proteins, latex, other medicines, foods, dyes, or preservatives -pregnant or trying to get pregnant -breast-feeding How should I use this medicine? This medicine is for injection into a vein or under the skin. It is usually given by a health care professional in a hospital or clinic setting. If you get this medicine at home, you will be taught how to prepare and give this medicine. Do not shake the solution before you withdraw a dose. Use exactly as directed. Take your medicine at regular intervals. Do not take your medicine more often than directed. It is important that you put your used needles and syringes in a special sharps container. Do not put them in a trash can. If you do not have a sharps container, call your pharmacist or healthcare provider to get one. Talk to your pediatrician regarding the use of this medicine in children. While this medicine may be used in children as young as 1 year for selected conditions, precautions do apply. Overdosage: If you think you have taken too much of this medicine contact a poison control center or  emergency room at once. NOTE: This medicine is only for you. Do not share this medicine with others. What if I miss a dose? If you miss a dose, take it as soon as you can. If it is almost time for your next dose, take only that dose. Do not take double or extra doses. What may interact with this medicine? Do not take this medicine with any of the following medications: -epoetin alfa This list may not describe all possible interactions. Give your health care provider a list of all the medicines, herbs, non-prescription drugs, or dietary supplements you use. Also tell them if you smoke, drink alcohol, or use illegal drugs. Some items may interact with your medicine. What should I watch for while using this medicine? Visit your prescriber or health care professional for regular checks on your progress and for the needed blood tests and blood pressure measurements. It is especially important for the doctor to make sure your hemoglobin level is in the desired range, to limit the risk of potential side effects and to give you the best benefit. Keep all appointments for any recommended tests. Check your blood pressure as directed. Ask your doctor what your blood pressure should be and when you should contact him or her. As your body makes more red blood cells, you may need to take iron, folic acid, or vitamin B supplements. Ask your doctor or health care provider which products are right for you. If you have kidney disease continue dietary restrictions, even though this medication can make you feel better. Talk with your doctor or health   care professional about the foods you eat and the vitamins that you take. What side effects may I notice from receiving this medicine? Side effects that you should report to your doctor or health care professional as soon as possible: -allergic reactions like skin rash, itching or hives, swelling of the face, lips, or tongue -breathing problems -changes in vision -chest  pain -confusion, trouble speaking or understanding -feeling faint or lightheaded, falls -high blood pressure -muscle aches or pains -pain, swelling, warmth in the leg -rapid weight gain -severe headaches -sudden numbness or weakness of the face, arm or leg -trouble walking, dizziness, loss of balance or coordination -seizures (convulsions) -swelling of the ankles, feet, hands -unusually weak or tired Side effects that usually do not require medical attention (report to your doctor or health care professional if they continue or are bothersome): -diarrhea -fever, chills (flu-like symptoms) -headaches -nausea, vomiting -redness, stinging, or swelling at site where injected This list may not describe all possible side effects. Call your doctor for medical advice about side effects. You may report side effects to FDA at 1-800-FDA-1088. Where should I keep my medicine? Keep out of the reach of children. Store in a refrigerator between 2 and 8 degrees C (36 and 46 degrees F). Do not freeze. Do not shake. Throw away any unused portion if using a single-dose vial. Throw away any unused medicine after the expiration date. NOTE: This sheet is a summary. It may not cover all possible information. If you have questions about this medicine, talk to your doctor, pharmacist, or health care provider.  2015, [employer]. (2008-01-09 10:23:57)  

## 2014-09-09 ENCOUNTER — Ambulatory Visit (HOSPITAL_COMMUNITY)
Admission: RE | Admit: 2014-09-09 | Discharge: 2014-09-09 | Disposition: A | Discharge status: Home / Self Care | Payer: Medicare Other | Source: Ambulatory Visit | Attending: Nephrology | Admitting: Nephrology

## 2014-09-09 DIAGNOSIS — D631 Anemia in chronic kidney disease: Secondary | ICD-10-CM | POA: Diagnosis not present

## 2014-09-09 DIAGNOSIS — N189 Chronic kidney disease, unspecified: Secondary | ICD-10-CM | POA: Diagnosis present

## 2014-09-09 DIAGNOSIS — Z79899 Other long term (current) drug therapy: Secondary | ICD-10-CM | POA: Insufficient documentation

## 2014-09-09 DIAGNOSIS — Z5181 Encounter for therapeutic drug level monitoring: Secondary | ICD-10-CM | POA: Insufficient documentation

## 2014-09-09 LAB — IRON AND TIBC
Iron: 60 ug/dL (ref 45–182)
Saturation Ratios: 26 % (ref 17.9–39.5)
TIBC: 230 ug/dL — ABNORMAL LOW (ref 250–450)
UIBC: 170 ug/dL

## 2014-09-09 LAB — RENAL FUNCTION PANEL
Albumin: 3.1 g/dL — ABNORMAL LOW (ref 3.5–5.0)
Anion gap: 10 (ref 5–15)
BUN: 75 mg/dL — AB (ref 6–20)
CALCIUM: 9.4 mg/dL (ref 8.9–10.3)
CO2: 25 mmol/L (ref 22–32)
Chloride: 105 mmol/L (ref 101–111)
Creatinine, Ser: 2.73 mg/dL — ABNORMAL HIGH (ref 0.61–1.24)
GFR calc Af Amer: 29 mL/min — ABNORMAL LOW (ref >60)
GFR calc non Af Amer: 25 mL/min — ABNORMAL LOW (ref >60)
GLUCOSE: 247 mg/dL — AB (ref 65–99)
Phosphorus: 5.6 mg/dL — ABNORMAL HIGH (ref 2.5–4.6)
Potassium: 3.9 mmol/L (ref 3.5–5.1)
Sodium: 140 mmol/L (ref 135–145)

## 2014-09-09 LAB — POCT HEMOGLOBIN-HEMACUE: Hemoglobin: 10.1 g/dL — ABNORMAL LOW (ref 13.0–17.0)

## 2014-09-09 LAB — FERRITIN: FERRITIN: 558 ng/mL — AB (ref 24–336)

## 2014-09-09 MED ORDER — DARBEPOETIN ALFA 200 MCG/0.4ML IJ SOSY
200.0000 ug | PREFILLED_SYRINGE | INTRAMUSCULAR | Status: DC
  Administered 2014-09-09: 200 ug via SUBCUTANEOUS

## 2014-09-09 MED ORDER — DARBEPOETIN ALFA 200 MCG/0.4ML IJ SOSY
PREFILLED_SYRINGE | INTRAMUSCULAR | Status: AC
  Filled 2014-09-09: qty 0.4

## 2014-09-13 ENCOUNTER — Encounter (HOSPITAL_COMMUNITY): Payer: Medicare Other

## 2014-10-07 ENCOUNTER — Emergency Department (HOSPITAL_COMMUNITY)
Admission: EM | Admit: 2014-10-07 | Discharge: 2014-10-07 | Disposition: A | Discharge status: Home / Self Care | Payer: Medicare Other | Attending: Emergency Medicine | Admitting: Emergency Medicine

## 2014-10-07 ENCOUNTER — Encounter (HOSPITAL_COMMUNITY)
Admission: RE | Admit: 2014-10-07 | Discharge: 2014-10-07 | Disposition: A | Discharge status: Home / Self Care | Payer: Medicare Other | Source: Ambulatory Visit | Attending: Nephrology | Admitting: Nephrology

## 2014-10-07 ENCOUNTER — Encounter (HOSPITAL_COMMUNITY): Payer: Self-pay | Admitting: Vascular Surgery

## 2014-10-07 DIAGNOSIS — Z79899 Other long term (current) drug therapy: Secondary | ICD-10-CM | POA: Insufficient documentation

## 2014-10-07 DIAGNOSIS — N492 Inflammatory disorders of scrotum: Secondary | ICD-10-CM | POA: Diagnosis present

## 2014-10-07 DIAGNOSIS — I129 Hypertensive chronic kidney disease with stage 1 through stage 4 chronic kidney disease, or unspecified chronic kidney disease: Secondary | ICD-10-CM | POA: Diagnosis not present

## 2014-10-07 DIAGNOSIS — F329 Major depressive disorder, single episode, unspecified: Secondary | ICD-10-CM | POA: Diagnosis not present

## 2014-10-07 DIAGNOSIS — Z5181 Encounter for therapeutic drug level monitoring: Secondary | ICD-10-CM | POA: Insufficient documentation

## 2014-10-07 DIAGNOSIS — Z7982 Long term (current) use of aspirin: Secondary | ICD-10-CM | POA: Insufficient documentation

## 2014-10-07 DIAGNOSIS — D631 Anemia in chronic kidney disease: Secondary | ICD-10-CM | POA: Insufficient documentation

## 2014-10-07 DIAGNOSIS — E782 Mixed hyperlipidemia: Secondary | ICD-10-CM | POA: Diagnosis not present

## 2014-10-07 DIAGNOSIS — Z8701 Personal history of pneumonia (recurrent): Secondary | ICD-10-CM | POA: Diagnosis not present

## 2014-10-07 DIAGNOSIS — N189 Chronic kidney disease, unspecified: Secondary | ICD-10-CM | POA: Insufficient documentation

## 2014-10-07 LAB — IRON AND TIBC
IRON: 66 ug/dL (ref 45–182)
Saturation Ratios: 30 % (ref 17.9–39.5)
TIBC: 220 ug/dL — ABNORMAL LOW (ref 250–450)
UIBC: 154 ug/dL

## 2014-10-07 LAB — CBG MONITORING, ED: GLUCOSE-CAPILLARY: 159 mg/dL — AB (ref 65–99)

## 2014-10-07 LAB — FERRITIN: FERRITIN: 559 ng/mL — AB (ref 24–336)

## 2014-10-07 LAB — POCT HEMOGLOBIN-HEMACUE: Hemoglobin: 12.4 g/dL — ABNORMAL LOW (ref 13.0–17.0)

## 2014-10-07 MED ORDER — HYDROCODONE-ACETAMINOPHEN 5-325 MG PO TABS
1.0000 | ORAL_TABLET | Freq: Once | ORAL | Status: AC
  Administered 2014-10-07: 1 via ORAL
  Filled 2014-10-07: qty 1

## 2014-10-07 MED ORDER — DARBEPOETIN ALFA 200 MCG/0.4ML IJ SOSY
PREFILLED_SYRINGE | INTRAMUSCULAR | Status: AC
  Filled 2014-10-07: qty 0.4

## 2014-10-07 MED ORDER — LIDOCAINE HCL 2 % IJ SOLN
10.0000 mL | Freq: Once | INTRAMUSCULAR | Status: AC
  Administered 2014-10-07: 200 mg
  Filled 2014-10-07: qty 20

## 2014-10-07 MED ORDER — DARBEPOETIN ALFA 200 MCG/0.4ML IJ SOSY: 200.0000 ug | PREFILLED_SYRINGE | INTRAMUSCULAR | Status: DC

## 2014-10-07 MED ORDER — HYDROCODONE-ACETAMINOPHEN 5-325 MG PO TABS: 1.0000 | ORAL_TABLET | ORAL | Status: DC | PRN

## 2014-10-07 MED ORDER — SULFAMETHOXAZOLE-TRIMETHOPRIM 800-160 MG PO TABS: 1.0000 | ORAL_TABLET | Freq: Two times a day (BID) | ORAL | Status: AC

## 2014-10-07 MED ORDER — ONDANSETRON 4 MG PO TBDP
4.0000 mg | ORAL_TABLET | Freq: Once | ORAL | Status: AC
  Administered 2014-10-07: 4 mg via ORAL
  Filled 2014-10-07: qty 1

## 2014-10-07 NOTE — Discharge Instructions (Signed)

## 2014-10-07 NOTE — ED Notes (Signed)
Patient acuity changed per PA request.

## 2014-10-07 NOTE — ED Notes (Signed)
Pt reports to the ED for eval of a boil under his scrotum. He reports he first noted the swelling last Sunday and it did not start hurting until last Monday or Tuesday. He has been using OTC boil ease without relief. Pt A&Ox4, resp e/u, and skin warm and dry. Denies any other complaints.

## 2014-10-07 NOTE — ED Provider Notes (Signed)
CSN: [id number]     Arrival date & time 10/07/14  1114 History  This chart was scribed for non-physician practitioner, Marlon Pel, PA-C, working with Mancel Bale, MD, by Ronney Lion, ED Scribe. This patient was seen in room TR09C/TR09C and the patient's care was started at 12:31 PM.    Chief Complaint  Patient presents with  . Abscess    The history is provided by the patient. No language interpreter was used.    Lawrence [name] 53 y.o.male  PCP: [name], MD  Blood pressure 148/60, pulse 66, temperature 97.8 F (36.6 C), temperature source Oral, resp. rate 16, SpO2 100 %.  SIGNIFICANT PMH: IDDM, hypertension, CKD, POMV, peripheral vascular disease, h/o hiatal hernia, depression CHIEF COMPLAINT: Abscess  When: 1 week ago How: Pt states he first noticed the swelling 1 week ago, and pain onset the next day.  Frequency: Recurrent abscesses; however, pt states he last had an abscess 10 years ago, and he never had one in this location Duration: 1 week Location: Underneath scrotum Radiation: N/A Quality: Severe, constant, worsening pain.  Alleviating factors: None Worsening factors: Touch Treatments tried: OTC boil ease, with no relief Associated Symptoms: None Negative ROS: Confusion, diaphoresis, fever, headache, weakness (general or focal), change of vision,  neck pain, dysphagia, aphagia, chest pain, shortness of breath,  back pain, abdominal pains, nausea, vomiting, diarrhea, lower extremity swelling, rash.     Past Medical History  Diagnosis Date  . Hypertension   . Chronic kidney disease   . Hypercholesteremia   . PONV (postoperative nausea and vomiting)   . Peripheral vascular disease   . H/O hiatal hernia     hx of years ago   . Pneumonia 10/23/2013  . IDDM (insulin dependent diabetes mellitus)   . Depression 08/2012    "just when foot first got cut off"   Past Surgical History  Procedure Laterality Date  . Amputation Right 05/03/2012    Procedure: Right First  Ray AMPUTATION;  Surgeon: Eldred Manges, MD;  Location: [facility] OR;  Service: Orthopedics;  Laterality: Right;  . I&d extremity Right 05/29/2012    Procedure: IRRIGATION AND DEBRIDEMENT EXTREMITY- right foot;  Surgeon: Eldred Manges, MD;  Location: [facility] OR;  Service: Orthopedics;  Laterality: Right;  Right Foot Debridement, VAC Application  . Amputation Right 08/16/2012    Procedure: AMPUTATION BELOW KNEE;  Surgeon: Eldred Manges, MD;  Location: [facility] OR;  Service: Orthopedics;  Laterality: Right;  Right Below Knee Amputation  . Amputation Right 10/04/2012    Procedure: AMPUTATION BELOW KNEE REVISION;  Surgeon: Eldred Manges, MD;  Location: [facility] OR;  Service: Orthopedics;  Laterality: Right;  . Insertion of suprapubic catheter N/A 03/02/2013    Procedure: INSERTION OF SUPRAPUBIC CATHETER;  Surgeon: Su Grand, MD;  Location: [facility] ORS;  Service: Urology;  Laterality: N/A;  . Tonsillectomy  ~ 1967  . Av fistula placement Right 11/01/2013    Procedure: RIGHT ARM ARTERIOVENOUS (AV) FISTULA CREATION;  Surgeon: Chuck Hint, MD;  Location: [facility] OR;  Service: Vascular;  Laterality: Right;  . Abdominal aortagram N/A 05/03/2012    Procedure: ABDOMINAL Ronny Flurry;  Surgeon: Sherren Kerns, MD;  Location: [facility] CATH LAB;  Service: Cardiovascular;  Laterality: N/A;  . Lower extremity angiogram Right 05/03/2012    Procedure: LOWER EXTREMITY ANGIOGRAM;  Surgeon: Sherren Kerns, MD;  Location: [facility] CATH LAB;  Service: Cardiovascular;  Laterality: Right;  rt leg angio   No family history on file. Social History  Substance  Use Topics  . Smoking status: Never Smoker   . Smokeless tobacco: Never Used  . Alcohol Use: Yes     Comment: 10/23/2013 "couple times/month I'll have a beer"    Review of Systems A complete 10 system review of systems was obtained and all systems are negative except as noted in the HPI and PMH.    Allergies  Review of patient's allergies indicates no known allergies.  Home Medications   Prior to  Admission medications   Medication Sig Start Date End Date Taking? Authorizing Provider  aspirin 81 MG tablet Take 81 mg by mouth daily.     Historical Provider, MD  atorvastatin (LIPITOR) 40 MG tablet TAKE 1 TABLET BY MOUTH ONCE DAILY 07/05/14   Tobey Grim, MD  Brinzolamide-Brimonidine [facility]) 1-0.2 % SUSP Place 1 drop into the left eye 2 (two) times daily.    Historical Provider, MD  calcitRIOL (ROCALTROL) 0.25 MCG capsule Take 1 capsule (0.25 mcg total) by mouth daily. 11/01/13   Ardith Dark, MD  ciprofloxacin (CIPRO) 500 MG tablet Take 1 tablet (500 mg total) by mouth 2 (two) times daily. One po bid x 7 days 05/05/14   Paula Libra, MD  EASY TOUCH PEN NEEDLES 31G X 8 MM MISC USE AS DIRECTED TO INJECT INSULIN. 07/05/14   Tobey Grim, MD  furosemide (LASIX) 40 MG tablet TAKE 1 TABLET BY MOUTH TWICE DAILY 07/05/14   Tobey Grim, MD  HYDROcodone-acetaminophen (NORCO) 5-325 MG per tablet Take 1 tablet by mouth every 8 (eight) hours. 08/09/14   Tobey Grim, MD  HYDROcodone-acetaminophen (NORCO/VICODIN) 5-325 MG per tablet Take 1-2 tablets by mouth every 4 (four) hours as needed. 10/07/14   Vinal Rosengrant Neva Seat, PA-C  Insulin Glargine (LANTUS SOLOSTAR) 100 UNIT/ML Solostar Pen Inject 20 Units into the skin daily at 10 pm. 07/10/14   Tobey Grim, MD  metoprolol tartrate (LOPRESSOR) 25 MG tablet TAKE 1 TABLET BY MOUTH TWICE DAILY 07/05/14   Tobey Grim, MD  oxybutynin (DITROPAN) 5 MG tablet Take 1 tablet (5 mg total) by mouth every 8 (eight) hours as needed for bladder spasms. 05/18/13   Marisa Severin, MD  sulfamethoxazole-trimethoprim (BACTRIM DS,SEPTRA DS) 800-160 MG per tablet Take 1 tablet by mouth 2 (two) times daily. 10/07/14 10/14/14  Jamerica Snavely Neva Seat, PA-C  ULTICARE INSULIN SYRINGE 30G X 1/2" 0.3 ML MISC USE ONE SYRINGE AT BEDTIME 11/15/13   [name], MD   BP 148/60 mmHg  Pulse 66  Temp(Src) 97.8 F (36.6 C) (Oral)  Resp 16  SpO2 100% Physical Exam  Constitutional: He is  oriented to person, place, and time. He appears well-developed and well-nourished. No distress.  HENT:  Head: Normocephalic and atraumatic.  Eyes: Conjunctivae and EOM are normal.  Neck: Neck supple. No tracheal deviation present.  Cardiovascular: Normal rate.   Pulmonary/Chest: Effort normal. No respiratory distress.  Genitourinary:     Musculoskeletal: Normal range of motion.  Neurological: He is alert and oriented to person, place, and time.  Skin: Skin is warm and dry.  Psychiatric: He has a normal mood and affect. His behavior is normal.  Nursing note and vitals reviewed.   ED Course  Procedures (including critical care time)  DIAGNOSTIC STUDIES: Oxygen Saturation is 100% on RA, normal by my interpretation.    COORDINATION OF CARE: 12:36 PM - Attending physician Dr. Effie Shy in room to evaluate pt.   Labs Review Labs Reviewed  CBG MONITORING, ED - Abnormal; Notable for the following:  Glucose-Capillary 159 (*)    All other components within normal limits    INCISION AND DRAINAGE PROCEDURE NOTE: Patient identification was confirmed and verbal consent was obtained. This procedure was performed by Marlon Pel, PA-C, at 1:55 PM. Site: Base of scrotum Sterile procedures observed Needle size: 18 Anesthetic used (type and amt): lidocaine 1 % wo epi, 3 ml's Blade size: 11 Drainage: large Complexity: Complex Packing used NONE Site anesthetized, incision made over site, wound drained and explored loculations, rinsed with copious amounts of normal saline, wound packed with sterile gauze, covered with dry, sterile dressing.  Pt tolerated procedure well without complications.  Instructions for care discussed verbally and pt provided with additional written instructions for homecare and f/u.    MDM   Final diagnoses:  Scrotal wall abscess   NO Fournier's  Gangrene. Dr. Effie Shy evaluated patient as well and we discussed approach. He agrees that this abscess is  superficial and drainable. Will give rx for Keflex and Vicodin. F/u with PCP or Urologist for wound check.  Medications  lidocaine (XYLOCAINE) 2 % (with pres) injection 200 mg (200 mg Other Given 10/07/14 1312)  HYDROcodone-acetaminophen (NORCO/VICODIN) 5-325 MG per tablet 1 tablet (1 tablet Oral Given 10/07/14 1310)  ondansetron (ZOFRAN-ODT) disintegrating tablet 4 mg (4 mg Oral Given 10/07/14 1310)    53 y.o.Lawrence [name]'s evaluation in the Emergency Department is complete. It has been determined that no acute conditions requiring further emergency intervention are present at this time. The patient/guardian have been advised of the diagnosis and plan. We have discussed signs and symptoms that warrant return to the ED, such as changes or worsening in symptoms.  Vital signs are stable at discharge. Filed Vitals:   10/07/14 1150  BP: 148/60  Pulse: 66  Temp: 97.8 F (36.6 C)  Resp: 16    Patient/guardian has voiced understanding and agreed to follow-up with the PCP or specialist.   I personally performed the services described in this documentation, which was scribed in my presence. The recorded information has been reviewed and is accurate.     Marlon Pel, PA-C 10/07/14 1406  Mancel Bale, MD 10/07/14 [phone]

## 2014-10-09 ENCOUNTER — Other Ambulatory Visit: Payer: Self-pay | Admitting: Family Medicine

## 2014-10-09 ENCOUNTER — Telehealth: Payer: Self-pay | Admitting: *Deleted

## 2014-10-09 NOTE — Telephone Encounter (Signed)
Tameka with [facility] called stating that only a 15 day supply of metoprolol and lasix was sent in.  They are requesting a 30 day supply; patient takes medications twice a day.  Clovis Pu, RN

## 2014-10-10 MED ORDER — FUROSEMIDE 40 MG PO TABS: 40.0000 mg | ORAL_TABLET | Freq: Two times a day (BID) | ORAL | Status: DC

## 2014-10-10 MED ORDER — METOPROLOL TARTRATE 25 MG PO TABS: 25.0000 mg | ORAL_TABLET | Freq: Two times a day (BID) | ORAL | Status: DC

## 2014-10-10 NOTE — Telephone Encounter (Signed)
I am covering for Dr. Gwendolyn Grant who is away from the office.  New rx sent in with #60 for each of these medications.  Latrelle Dodrill, MD

## 2014-10-22 ENCOUNTER — Ambulatory Visit (HOSPITAL_COMMUNITY)
Admission: RE | Admit: 2014-10-22 | Discharge: 2014-10-22 | Disposition: A | Discharge status: Home / Self Care | Payer: Medicare Other | Source: Ambulatory Visit | Attending: Nephrology | Admitting: Nephrology

## 2014-10-22 DIAGNOSIS — D631 Anemia in chronic kidney disease: Secondary | ICD-10-CM | POA: Diagnosis not present

## 2014-10-22 DIAGNOSIS — N189 Chronic kidney disease, unspecified: Secondary | ICD-10-CM | POA: Insufficient documentation

## 2014-10-22 LAB — POCT HEMOGLOBIN-HEMACUE: HEMOGLOBIN: 11.3 g/dL — AB (ref 13.0–17.0)

## 2014-10-22 MED ORDER — DARBEPOETIN ALFA 200 MCG/0.4ML IJ SOSY
200.0000 ug | PREFILLED_SYRINGE | INTRAMUSCULAR | Status: DC
  Administered 2014-10-22: 200 ug via SUBCUTANEOUS

## 2014-10-22 MED ORDER — DARBEPOETIN ALFA 200 MCG/0.4ML IJ SOSY
PREFILLED_SYRINGE | INTRAMUSCULAR | Status: AC
  Filled 2014-10-22: qty 0.4

## 2014-11-01 ENCOUNTER — Ambulatory Visit: Payer: Medicare Other | Admitting: Family Medicine

## 2014-11-08 ENCOUNTER — Encounter: Payer: Self-pay | Admitting: Family Medicine

## 2014-11-08 ENCOUNTER — Ambulatory Visit (INDEPENDENT_AMBULATORY_CARE_PROVIDER_SITE_OTHER): Payer: Medicare Other | Admitting: Family Medicine

## 2014-11-08 VITALS — BP 141/66 | HR 85 | Temp 98.2°F | Ht 65.0 in | Wt 177.8 lb

## 2014-11-08 DIAGNOSIS — IMO0002 Reserved for concepts with insufficient information to code with codable children: Secondary | ICD-10-CM

## 2014-11-08 DIAGNOSIS — E1165 Type 2 diabetes mellitus with hyperglycemia: Secondary | ICD-10-CM

## 2014-11-08 DIAGNOSIS — R05 Cough: Secondary | ICD-10-CM | POA: Diagnosis not present

## 2014-11-08 DIAGNOSIS — R053 Chronic cough: Secondary | ICD-10-CM

## 2014-11-08 LAB — POCT GLYCOSYLATED HEMOGLOBIN (HGB A1C): HEMOGLOBIN A1C: 7.8

## 2014-11-08 NOTE — Assessment & Plan Note (Signed)
Control is better today. No change to insulin regimen.  Continue to follow diabetic diet.

## 2014-11-08 NOTE — Assessment & Plan Note (Signed)
Persists.  Showed him atelectasis on prior CXRs.  Likely cause of cough. Undiagnosed asthma another possibility.  Declined any further w/u for this today.

## 2014-11-08 NOTE — Progress Notes (Signed)
Subjective:    Lawrence [name] is a 54 y.o. male who presents to [facility] today for FU for dm2:  1.  Diabetes:  Currently on Lantus 20 units nightly.   No adverse effects from medication.  No hypoglycemic events.  No paresthesia or peripheral nerve pain.  Measures blood sugars at home every:  Day or so.  States no changes in diet recently.    Lab Results  Component Value Date   HGBA1C 7.8 11/08/2014   2. Cough:  Fairly persistent for at least a year.  Not worse anytime of day.  No weight loss.  Nonproductive.  NO fevers or chills.  States he's had multiple CXRs and never told the results.    ROS as above per HPI, otherwise neg.  Pertinently, no chest pain, palpitations, SOB, Fever, Chills, Abd pain, N/V/D.   The following portions of the patient's history were reviewed and updated as appropriate: allergies, current medications, past medical history, family and social history, and problem list. Patient is a nonsmoker.    PMH reviewed.  Past Medical History  Diagnosis Date  . Hypertension   . Chronic kidney disease   . Hypercholesteremia   . PONV (postoperative nausea and vomiting)   . Peripheral vascular disease   . H/O hiatal hernia     hx of years ago   . Pneumonia 10/23/2013  . IDDM (insulin dependent diabetes mellitus)   . Depression 08/2012    "just when foot first got cut off"   Past Surgical History  Procedure Laterality Date  . Amputation Right 05/03/2012    Procedure: Right First Ray AMPUTATION;  Surgeon: Eldred Manges, MD;  Location: [facility] OR;  Service: Orthopedics;  Laterality: Right;  . I&d extremity Right 05/29/2012    Procedure: IRRIGATION AND DEBRIDEMENT EXTREMITY- right foot;  Surgeon: Eldred Manges, MD;  Location: [facility] OR;  Service: Orthopedics;  Laterality: Right;  Right Foot Debridement, VAC Application  . Amputation Right 08/16/2012    Procedure: AMPUTATION BELOW KNEE;  Surgeon: Eldred Manges, MD;  Location: [facility] OR;  Service: Orthopedics;  Laterality: Right;  Right Below Knee  Amputation  . Amputation Right 10/04/2012    Procedure: AMPUTATION BELOW KNEE REVISION;  Surgeon: Eldred Manges, MD;  Location: [facility] OR;  Service: Orthopedics;  Laterality: Right;  . Insertion of suprapubic catheter N/A 03/02/2013    Procedure: INSERTION OF SUPRAPUBIC CATHETER;  Surgeon: Su Grand, MD;  Location: [facility] ORS;  Service: Urology;  Laterality: N/A;  . Tonsillectomy  ~ 1967  . Av fistula placement Right 11/01/2013    Procedure: RIGHT ARM ARTERIOVENOUS (AV) FISTULA CREATION;  Surgeon: Chuck Hint, MD;  Location: [facility] OR;  Service: Vascular;  Laterality: Right;  . Abdominal aortagram N/A 05/03/2012    Procedure: ABDOMINAL Ronny Flurry;  Surgeon: Sherren Kerns, MD;  Location[facility] CATH LAB;  Service: Cardiovascular;  Laterality: N/A;  . Lower extremity angiogram Right 05/03/2012    Procedure: LOWER EXTREMITY ANGIOGRAM;  Surgeon: Sherren Kerns, MD;  Location: [facility] CATH LAB;  Service: Cardiovascular;  Laterality: Right;  rt leg angio    Medications reviewed. Current Outpatient Prescriptions  Medication Sig Dispense Refill  . aspirin 81 MG tablet Take 81 mg by mouth daily.     Marland Kitchen atorvastatin (LIPITOR) 40 MG tablet TAKE 1 TABLET BY MOUTH EVERY DAY 30 tablet 3  . Brinzolamide-Brimonidine (SIMBRINZA) 1-0.2 % SUSP Place 1 drop into the left eye 2 (two) times daily.    . calcitRIOL (ROCALTROL) 0.25 MCG  capsule Take 1 capsule (0.25 mcg total) by mouth daily. 30 capsule 0  . ciprofloxacin (CIPRO) 500 MG tablet Take 1 tablet (500 mg total) by mouth 2 (two) times daily. One po bid x 7 days 14 tablet 0  . EASY TOUCH PEN NEEDLES 31G X 8 MM MISC USE AS DIRECTED TO INJECT INSULIN. 100 each 4  . furosemide (LASIX) 40 MG tablet Take 1 tablet (40 mg total) by mouth 2 (two) times daily. 60 tablet 3  . HYDROcodone-acetaminophen (NORCO) 5-325 MG per tablet Take 1 tablet by mouth every 8 (eight) hours. 30 tablet 0  . HYDROcodone-acetaminophen (NORCO/VICODIN) 5-325 MG per tablet Take 1-2 tablets by mouth every  4 (four) hours as needed. 10 tablet 0  . Insulin Glargine (LANTUS SOLOSTAR) 100 UNIT/ML Solostar Pen Inject 20 Units into the skin daily at 10 pm. 15 mL 11  . metoprolol tartrate (LOPRESSOR) 25 MG tablet Take 1 tablet (25 mg total) by mouth 2 (two) times daily. 60 tablet 3  . oxybutynin (DITROPAN) 5 MG tablet Take 1 tablet (5 mg total) by mouth every 8 (eight) hours as needed for bladder spasms. 30 tablet 0  . ULTICARE INSULIN SYRINGE 30G X 1/2" 0.3 ML MISC USE ONE SYRINGE AT BEDTIME 100 each 0   No current facility-administered medications for this visit.     Objective:   Physical Exam Temp(Src) 98.2 F (36.8 C) (Oral)  Ht  (1.651 m)  Wt 177 lb 12.8 oz (80.65 kg)  BMI 29.59 kg/m2  SpO2 100% Gen:  Alert, cooperative patient who appears stated age in no acute distress.  Vital signs reviewed.  Sallow appearance.  HEENT: EOMI,  MMM.  Chronic red eye noted.   Cardiac:  Regular rate and rhythm  Pulm:  Clear to auscultation bilaterally  Abd:  Soft/nondistended/nontender.      No results found for this or any previous visit (from the past 72 hour(s)).

## 2014-11-19 ENCOUNTER — Ambulatory Visit (HOSPITAL_COMMUNITY)
Admission: RE | Admit: 2014-11-19 | Discharge: 2014-11-19 | Disposition: A | Discharge status: Home / Self Care | Payer: Medicare Other | Source: Ambulatory Visit | Attending: Nephrology | Admitting: Nephrology

## 2014-11-19 DIAGNOSIS — Z5181 Encounter for therapeutic drug level monitoring: Secondary | ICD-10-CM | POA: Insufficient documentation

## 2014-11-19 DIAGNOSIS — N189 Chronic kidney disease, unspecified: Secondary | ICD-10-CM | POA: Insufficient documentation

## 2014-11-19 DIAGNOSIS — Z79899 Other long term (current) drug therapy: Secondary | ICD-10-CM | POA: Diagnosis not present

## 2014-11-19 DIAGNOSIS — D631 Anemia in chronic kidney disease: Secondary | ICD-10-CM | POA: Diagnosis not present

## 2014-11-19 LAB — RENAL FUNCTION PANEL
ALBUMIN: 3.3 g/dL — AB (ref 3.5–5.0)
Anion gap: 10 (ref 5–15)
BUN: 87 mg/dL — AB (ref 6–20)
CHLORIDE: 102 mmol/L (ref 101–111)
CO2: 24 mmol/L (ref 22–32)
CREATININE: 2.66 mg/dL — AB (ref 0.61–1.24)
Calcium: 9.3 mg/dL (ref 8.9–10.3)
GFR calc Af Amer: 30 mL/min — ABNORMAL LOW (ref >60)
GFR, EST NON AFRICAN AMERICAN: 26 mL/min — AB (ref >60)
Glucose, Bld: 281 mg/dL — ABNORMAL HIGH (ref 65–99)
Phosphorus: 5.8 mg/dL — ABNORMAL HIGH (ref 2.5–4.6)
Potassium: 4.1 mmol/L (ref 3.5–5.1)
Sodium: 136 mmol/L (ref 135–145)

## 2014-11-19 LAB — IRON AND TIBC
Iron: 81 ug/dL (ref 45–182)
SATURATION RATIOS: 35 % (ref 17.9–39.5)
TIBC: 232 ug/dL — ABNORMAL LOW (ref 250–450)
UIBC: 151 ug/dL

## 2014-11-19 LAB — FERRITIN: FERRITIN: 472 ng/mL — AB (ref 24–336)

## 2014-11-19 LAB — POCT HEMOGLOBIN-HEMACUE: HEMOGLOBIN: 13.5 g/dL (ref 13.0–17.0)

## 2014-11-19 MED ORDER — DARBEPOETIN ALFA 60 MCG/0.3ML IJ SOSY
PREFILLED_SYRINGE | INTRAMUSCULAR | Status: AC
  Filled 2014-11-19: qty 0.3

## 2014-11-19 MED ORDER — DARBEPOETIN ALFA 60 MCG/0.3ML IJ SOSY: 60.0000 ug | PREFILLED_SYRINGE | INTRAMUSCULAR | Status: DC

## 2014-12-03 ENCOUNTER — Ambulatory Visit (HOSPITAL_COMMUNITY)
Admission: RE | Admit: 2014-12-03 | Discharge: 2014-12-03 | Disposition: A | Discharge status: Home / Self Care | Payer: Medicare Other | Source: Ambulatory Visit | Attending: Nephrology | Admitting: Nephrology

## 2014-12-03 DIAGNOSIS — Z5181 Encounter for therapeutic drug level monitoring: Secondary | ICD-10-CM | POA: Diagnosis not present

## 2014-12-03 DIAGNOSIS — D631 Anemia in chronic kidney disease: Secondary | ICD-10-CM | POA: Insufficient documentation

## 2014-12-03 DIAGNOSIS — Z79899 Other long term (current) drug therapy: Secondary | ICD-10-CM | POA: Diagnosis not present

## 2014-12-03 DIAGNOSIS — N189 Chronic kidney disease, unspecified: Secondary | ICD-10-CM | POA: Diagnosis not present

## 2014-12-03 LAB — POCT HEMOGLOBIN-HEMACUE: HEMOGLOBIN: 12.9 g/dL — AB (ref 13.0–17.0)

## 2014-12-03 MED ORDER — DARBEPOETIN ALFA 60 MCG/0.3ML IJ SOSY: 60.0000 ug | PREFILLED_SYRINGE | INTRAMUSCULAR | Status: DC

## 2014-12-17 ENCOUNTER — Encounter (HOSPITAL_COMMUNITY)
Admission: RE | Admit: 2014-12-17 | Discharge: 2014-12-17 | Disposition: A | Discharge status: Home / Self Care | Payer: Medicare Other | Source: Ambulatory Visit | Attending: Nephrology | Admitting: Nephrology

## 2014-12-17 DIAGNOSIS — Z5181 Encounter for therapeutic drug level monitoring: Secondary | ICD-10-CM | POA: Diagnosis not present

## 2014-12-17 DIAGNOSIS — D631 Anemia in chronic kidney disease: Secondary | ICD-10-CM | POA: Diagnosis not present

## 2014-12-17 DIAGNOSIS — N189 Chronic kidney disease, unspecified: Secondary | ICD-10-CM | POA: Insufficient documentation

## 2014-12-17 DIAGNOSIS — Z79899 Other long term (current) drug therapy: Secondary | ICD-10-CM | POA: Diagnosis not present

## 2014-12-17 LAB — IRON AND TIBC
IRON: 138 ug/dL (ref 45–182)
Saturation Ratios: 55 % — ABNORMAL HIGH (ref 17.9–39.5)
TIBC: 251 ug/dL (ref 250–450)
UIBC: 113 ug/dL

## 2014-12-17 LAB — POCT HEMOGLOBIN-HEMACUE: HEMOGLOBIN: 12 g/dL — AB (ref 13.0–17.0)

## 2014-12-17 LAB — FERRITIN: FERRITIN: 834 ng/mL — AB (ref 24–336)

## 2014-12-17 MED ORDER — DARBEPOETIN ALFA 100 MCG/0.5ML IJ SOSY: 100.0000 ug | PREFILLED_SYRINGE | INTRAMUSCULAR | Status: DC

## 2014-12-27 DIAGNOSIS — N312 Flaccid neuropathic bladder, not elsewhere classified: Secondary | ICD-10-CM | POA: Diagnosis not present

## 2014-12-31 ENCOUNTER — Ambulatory Visit (HOSPITAL_COMMUNITY)
Admission: RE | Admit: 2014-12-31 | Discharge: 2014-12-31 | Disposition: A | Discharge status: Home / Self Care | Payer: Medicare Other | Source: Ambulatory Visit | Attending: Nephrology | Admitting: Nephrology

## 2014-12-31 DIAGNOSIS — Z79899 Other long term (current) drug therapy: Secondary | ICD-10-CM | POA: Insufficient documentation

## 2014-12-31 DIAGNOSIS — Z5181 Encounter for therapeutic drug level monitoring: Secondary | ICD-10-CM | POA: Insufficient documentation

## 2014-12-31 DIAGNOSIS — D631 Anemia in chronic kidney disease: Secondary | ICD-10-CM | POA: Insufficient documentation

## 2014-12-31 DIAGNOSIS — N189 Chronic kidney disease, unspecified: Secondary | ICD-10-CM | POA: Diagnosis not present

## 2014-12-31 LAB — POCT HEMOGLOBIN-HEMACUE: Hemoglobin: 10.7 g/dL — ABNORMAL LOW (ref 13.0–17.0)

## 2014-12-31 MED ORDER — DARBEPOETIN ALFA 100 MCG/0.5ML IJ SOSY
100.0000 ug | PREFILLED_SYRINGE | INTRAMUSCULAR | Status: DC
  Administered 2014-12-31: 100 ug via SUBCUTANEOUS

## 2014-12-31 MED ORDER — DARBEPOETIN ALFA 100 MCG/0.5ML IJ SOSY
PREFILLED_SYRINGE | INTRAMUSCULAR | Status: AC
Start: 2014-12-31 — End: 2014-12-31
  Administered 2014-12-31: 100 ug via SUBCUTANEOUS
  Filled 2014-12-31: qty 0.5

## 2015-01-14 ENCOUNTER — Ambulatory Visit: Payer: Medicare Other | Admitting: Family Medicine

## 2015-01-15 DIAGNOSIS — E113512 Type 2 diabetes mellitus with proliferative diabetic retinopathy with macular edema, left eye: Secondary | ICD-10-CM | POA: Diagnosis not present

## 2015-01-21 ENCOUNTER — Ambulatory Visit: Payer: Medicare Other | Admitting: Family Medicine

## 2015-01-24 ENCOUNTER — Ambulatory Visit: Payer: Medicare Other | Admitting: Family Medicine

## 2015-01-28 ENCOUNTER — Inpatient Hospital Stay (HOSPITAL_COMMUNITY): Admission: RE | Admit: 2015-01-28 | Payer: Medicare Other | Source: Ambulatory Visit

## 2015-01-29 ENCOUNTER — Other Ambulatory Visit: Payer: Self-pay | Admitting: Family Medicine

## 2015-01-29 DIAGNOSIS — N312 Flaccid neuropathic bladder, not elsewhere classified: Secondary | ICD-10-CM | POA: Diagnosis not present

## 2015-01-30 ENCOUNTER — Encounter (HOSPITAL_COMMUNITY)
Admission: RE | Admit: 2015-01-30 | Discharge: 2015-01-30 | Disposition: A | Discharge status: Home / Self Care | Payer: Medicare Other | Source: Ambulatory Visit | Attending: Nephrology | Admitting: Nephrology

## 2015-01-30 DIAGNOSIS — Z5181 Encounter for therapeutic drug level monitoring: Secondary | ICD-10-CM | POA: Insufficient documentation

## 2015-01-30 DIAGNOSIS — D631 Anemia in chronic kidney disease: Secondary | ICD-10-CM | POA: Insufficient documentation

## 2015-01-30 DIAGNOSIS — N184 Chronic kidney disease, stage 4 (severe): Secondary | ICD-10-CM | POA: Diagnosis not present

## 2015-01-30 DIAGNOSIS — Z79899 Other long term (current) drug therapy: Secondary | ICD-10-CM | POA: Diagnosis not present

## 2015-01-30 LAB — IRON AND TIBC
IRON: 94 ug/dL (ref 45–182)
Saturation Ratios: 38 % (ref 17.9–39.5)
TIBC: 246 ug/dL — ABNORMAL LOW (ref 250–450)
UIBC: 152 ug/dL

## 2015-01-30 LAB — POCT HEMOGLOBIN-HEMACUE: HEMOGLOBIN: 11.2 g/dL — AB (ref 13.0–17.0)

## 2015-01-30 LAB — FERRITIN: Ferritin: 699 ng/mL — ABNORMAL HIGH (ref 24–336)

## 2015-01-30 MED ORDER — DARBEPOETIN ALFA 100 MCG/0.5ML IJ SOSY: 100.0000 ug | PREFILLED_SYRINGE | INTRAMUSCULAR | Status: DC

## 2015-01-30 MED ORDER — DARBEPOETIN ALFA 100 MCG/0.5ML IJ SOSY
PREFILLED_SYRINGE | INTRAMUSCULAR | Status: AC
Start: 2015-01-30 — End: 2015-01-30
  Administered 2015-01-30: 100 ug via SUBCUTANEOUS
  Filled 2015-01-30: qty 0.5

## 2015-02-13 DIAGNOSIS — E1139 Type 2 diabetes mellitus with other diabetic ophthalmic complication: Secondary | ICD-10-CM | POA: Diagnosis not present

## 2015-02-13 DIAGNOSIS — H4060X2 Glaucoma secondary to drugs, unspecified eye, moderate stage: Secondary | ICD-10-CM | POA: Diagnosis not present

## 2015-02-13 DIAGNOSIS — E113512 Type 2 diabetes mellitus with proliferative diabetic retinopathy with macular edema, left eye: Secondary | ICD-10-CM | POA: Diagnosis not present

## 2015-02-13 DIAGNOSIS — E113593 Type 2 diabetes mellitus with proliferative diabetic retinopathy without macular edema, bilateral: Secondary | ICD-10-CM | POA: Diagnosis not present

## 2015-02-13 DIAGNOSIS — H44521 Atrophy of globe, right eye: Secondary | ICD-10-CM | POA: Diagnosis not present

## 2015-02-18 ENCOUNTER — Ambulatory Visit: Payer: Medicare Other | Admitting: Family Medicine

## 2015-02-25 DIAGNOSIS — N312 Flaccid neuropathic bladder, not elsewhere classified: Secondary | ICD-10-CM | POA: Diagnosis not present

## 2015-02-27 ENCOUNTER — Encounter (HOSPITAL_COMMUNITY)
Admission: RE | Admit: 2015-02-27 | Discharge: 2015-02-27 | Disposition: A | Discharge status: Home / Self Care | Payer: Medicare Other | Source: Ambulatory Visit | Attending: Nephrology | Admitting: Nephrology

## 2015-02-27 DIAGNOSIS — D631 Anemia in chronic kidney disease: Secondary | ICD-10-CM | POA: Insufficient documentation

## 2015-02-27 DIAGNOSIS — N189 Chronic kidney disease, unspecified: Secondary | ICD-10-CM | POA: Insufficient documentation

## 2015-02-27 DIAGNOSIS — Z5181 Encounter for therapeutic drug level monitoring: Secondary | ICD-10-CM | POA: Diagnosis not present

## 2015-02-27 DIAGNOSIS — Z79899 Other long term (current) drug therapy: Secondary | ICD-10-CM | POA: Insufficient documentation

## 2015-02-27 LAB — POCT HEMOGLOBIN-HEMACUE: HEMOGLOBIN: 11 g/dL — AB (ref 13.0–17.0)

## 2015-02-27 LAB — IRON AND TIBC
Iron: 83 ug/dL (ref 45–182)
Saturation Ratios: 37 % (ref 17.9–39.5)
TIBC: 224 ug/dL — ABNORMAL LOW (ref 250–450)
UIBC: 141 ug/dL

## 2015-02-27 LAB — FERRITIN: Ferritin: 536 ng/mL — ABNORMAL HIGH (ref 24–336)

## 2015-02-27 MED ORDER — DARBEPOETIN ALFA 100 MCG/0.5ML IJ SOSY
100.0000 ug | PREFILLED_SYRINGE | INTRAMUSCULAR | Status: DC
  Administered 2015-02-27: 100 ug via SUBCUTANEOUS

## 2015-02-27 MED ORDER — DARBEPOETIN ALFA 100 MCG/0.5ML IJ SOSY
PREFILLED_SYRINGE | INTRAMUSCULAR | Status: AC
  Filled 2015-02-27: qty 0.5

## 2015-02-28 ENCOUNTER — Ambulatory Visit: Payer: Medicare Other | Admitting: Family Medicine

## 2015-03-04 ENCOUNTER — Other Ambulatory Visit: Payer: Self-pay | Admitting: *Deleted

## 2015-03-04 MED ORDER — ATORVASTATIN CALCIUM 40 MG PO TABS: 40.0000 mg | ORAL_TABLET | Freq: Every day | ORAL | Status: DC

## 2015-03-04 MED ORDER — METOPROLOL TARTRATE 25 MG PO TABS: 25.0000 mg | ORAL_TABLET | Freq: Two times a day (BID) | ORAL | Status: DC

## 2015-03-04 MED ORDER — FUROSEMIDE 40 MG PO TABS: 40.0000 mg | ORAL_TABLET | Freq: Two times a day (BID) | ORAL | Status: DC

## 2015-03-04 MED ORDER — INSULIN GLARGINE 100 UNIT/ML SOLOSTAR PEN: 20.0000 [IU] | PEN_INJECTOR | Freq: Every day | SUBCUTANEOUS | Status: DC

## 2015-03-04 NOTE — Telephone Encounter (Signed)
Has changed pharmacy.  New pharmacy is [facility]--mail order. They should be sending fax about refills

## 2015-03-07 ENCOUNTER — Emergency Department (HOSPITAL_COMMUNITY)
Admission: EM | Admit: 2015-03-07 | Discharge: 2015-03-08 | Disposition: A | Discharge status: Home / Self Care | Payer: Medicare Other | Source: Home / Self Care | Attending: Emergency Medicine | Admitting: Emergency Medicine

## 2015-03-07 ENCOUNTER — Emergency Department (HOSPITAL_COMMUNITY): Payer: Medicare Other

## 2015-03-07 ENCOUNTER — Encounter (HOSPITAL_COMMUNITY): Payer: Self-pay

## 2015-03-07 DIAGNOSIS — E7800 Pure hypercholesterolemia, unspecified: Secondary | ICD-10-CM | POA: Insufficient documentation

## 2015-03-07 DIAGNOSIS — I5032 Chronic diastolic (congestive) heart failure: Secondary | ICD-10-CM | POA: Diagnosis not present

## 2015-03-07 DIAGNOSIS — N184 Chronic kidney disease, stage 4 (severe): Secondary | ICD-10-CM | POA: Diagnosis not present

## 2015-03-07 DIAGNOSIS — Z794 Long term (current) use of insulin: Secondary | ICD-10-CM

## 2015-03-07 DIAGNOSIS — Z79899 Other long term (current) drug therapy: Secondary | ICD-10-CM

## 2015-03-07 DIAGNOSIS — E1122 Type 2 diabetes mellitus with diabetic chronic kidney disease: Secondary | ICD-10-CM | POA: Diagnosis not present

## 2015-03-07 DIAGNOSIS — R58 Hemorrhage, not elsewhere classified: Secondary | ICD-10-CM | POA: Diagnosis not present

## 2015-03-07 DIAGNOSIS — Z7982 Long term (current) use of aspirin: Secondary | ICD-10-CM

## 2015-03-07 DIAGNOSIS — K645 Perianal venous thrombosis: Secondary | ICD-10-CM | POA: Diagnosis not present

## 2015-03-07 DIAGNOSIS — N179 Acute kidney failure, unspecified: Secondary | ICD-10-CM | POA: Diagnosis not present

## 2015-03-07 DIAGNOSIS — K649 Unspecified hemorrhoids: Secondary | ICD-10-CM

## 2015-03-07 DIAGNOSIS — E119 Type 2 diabetes mellitus without complications: Secondary | ICD-10-CM

## 2015-03-07 DIAGNOSIS — E1142 Type 2 diabetes mellitus with diabetic polyneuropathy: Secondary | ICD-10-CM | POA: Diagnosis not present

## 2015-03-07 DIAGNOSIS — R339 Retention of urine, unspecified: Secondary | ICD-10-CM | POA: Diagnosis not present

## 2015-03-07 DIAGNOSIS — Z8701 Personal history of pneumonia (recurrent): Secondary | ICD-10-CM | POA: Insufficient documentation

## 2015-03-07 DIAGNOSIS — Z8659 Personal history of other mental and behavioral disorders: Secondary | ICD-10-CM

## 2015-03-07 DIAGNOSIS — Z89511 Acquired absence of right leg below knee: Secondary | ICD-10-CM | POA: Diagnosis not present

## 2015-03-07 DIAGNOSIS — N3289 Other specified disorders of bladder: Secondary | ICD-10-CM | POA: Diagnosis not present

## 2015-03-07 DIAGNOSIS — K5900 Constipation, unspecified: Secondary | ICD-10-CM | POA: Diagnosis not present

## 2015-03-07 DIAGNOSIS — E860 Dehydration: Secondary | ICD-10-CM | POA: Diagnosis not present

## 2015-03-07 DIAGNOSIS — Z9889 Other specified postprocedural states: Secondary | ICD-10-CM

## 2015-03-07 DIAGNOSIS — I130 Hypertensive heart and chronic kidney disease with heart failure and stage 1 through stage 4 chronic kidney disease, or unspecified chronic kidney disease: Secondary | ICD-10-CM | POA: Diagnosis not present

## 2015-03-07 DIAGNOSIS — N2581 Secondary hyperparathyroidism of renal origin: Secondary | ICD-10-CM | POA: Diagnosis not present

## 2015-03-07 DIAGNOSIS — N390 Urinary tract infection, site not specified: Secondary | ICD-10-CM | POA: Insufficient documentation

## 2015-03-07 DIAGNOSIS — I129 Hypertensive chronic kidney disease with stage 1 through stage 4 chronic kidney disease, or unspecified chronic kidney disease: Secondary | ICD-10-CM

## 2015-03-07 DIAGNOSIS — N189 Chronic kidney disease, unspecified: Secondary | ICD-10-CM | POA: Insufficient documentation

## 2015-03-07 DIAGNOSIS — E43 Unspecified severe protein-calorie malnutrition: Secondary | ICD-10-CM | POA: Diagnosis not present

## 2015-03-07 DIAGNOSIS — K644 Residual hemorrhoidal skin tags: Secondary | ICD-10-CM

## 2015-03-07 DIAGNOSIS — M4802 Spinal stenosis, cervical region: Secondary | ICD-10-CM | POA: Diagnosis not present

## 2015-03-07 DIAGNOSIS — R52 Pain, unspecified: Secondary | ICD-10-CM | POA: Diagnosis not present

## 2015-03-07 DIAGNOSIS — K6289 Other specified diseases of anus and rectum: Secondary | ICD-10-CM | POA: Diagnosis not present

## 2015-03-07 DIAGNOSIS — R102 Pelvic and perineal pain: Secondary | ICD-10-CM | POA: Diagnosis not present

## 2015-03-07 DIAGNOSIS — H409 Unspecified glaucoma: Secondary | ICD-10-CM | POA: Diagnosis not present

## 2015-03-07 LAB — URINE MICROSCOPIC-ADD ON

## 2015-03-07 LAB — URINALYSIS, ROUTINE W REFLEX MICROSCOPIC
Bilirubin Urine: NEGATIVE
Glucose, UA: 250 mg/dL — AB
KETONES UR: NEGATIVE mg/dL
NITRITE: NEGATIVE
PH: 5 (ref 5.0–8.0)
Protein, ur: 100 mg/dL — AB
SPECIFIC GRAVITY, URINE: 1.015 (ref 1.005–1.030)

## 2015-03-07 LAB — CBC WITH DIFFERENTIAL/PLATELET
BASOS ABS: 0 10*3/uL (ref 0.0–0.1)
Basophils Relative: 0 %
Eosinophils Absolute: 0.1 10*3/uL (ref 0.0–0.7)
Eosinophils Relative: 1 %
HEMATOCRIT: 32 % — AB (ref 39.0–52.0)
Hemoglobin: 10.6 g/dL — ABNORMAL LOW (ref 13.0–17.0)
LYMPHS ABS: 0.7 10*3/uL (ref 0.7–4.0)
LYMPHS PCT: 5 %
MCH: 27.7 pg (ref 26.0–34.0)
MCHC: 33.1 g/dL (ref 30.0–36.0)
MCV: 83.8 fL (ref 78.0–100.0)
MONO ABS: 1.3 10*3/uL — AB (ref 0.1–1.0)
Monocytes Relative: 9 %
Neutro Abs: 11.9 10*3/uL — ABNORMAL HIGH (ref 1.7–7.7)
Neutrophils Relative %: 85 %
Platelets: 144 10*3/uL — ABNORMAL LOW (ref 150–400)
RBC: 3.82 MIL/uL — AB (ref 4.22–5.81)
RDW: 14.4 % (ref 11.5–15.5)
WBC: 14.1 10*3/uL — AB (ref 4.0–10.5)

## 2015-03-07 LAB — BASIC METABOLIC PANEL
ANION GAP: 14 (ref 5–15)
BUN: 74 mg/dL — AB (ref 6–20)
CHLORIDE: 101 mmol/L (ref 101–111)
CO2: 21 mmol/L — AB (ref 22–32)
Calcium: 9.2 mg/dL (ref 8.9–10.3)
Creatinine, Ser: 3.39 mg/dL — ABNORMAL HIGH (ref 0.61–1.24)
GFR calc Af Amer: 22 mL/min — ABNORMAL LOW (ref >60)
GFR calc non Af Amer: 19 mL/min — ABNORMAL LOW (ref >60)
GLUCOSE: 200 mg/dL — AB (ref 65–99)
POTASSIUM: 3.9 mmol/L (ref 3.5–5.1)
Sodium: 136 mmol/L (ref 135–145)

## 2015-03-07 LAB — POC OCCULT BLOOD, ED: Fecal Occult Bld: NEGATIVE

## 2015-03-07 MED ORDER — SODIUM CHLORIDE 0.9 % IV BOLUS (SEPSIS): 1000.0000 mL | Freq: Once | INTRAVENOUS | Status: DC

## 2015-03-07 MED ORDER — LIDOCAINE HCL (PF) 1 % IJ SOLN
INTRAMUSCULAR | Status: AC
  Administered 2015-03-07: 5 mL
  Filled 2015-03-07: qty 5

## 2015-03-07 MED ORDER — CEFTRIAXONE SODIUM 1 G IJ SOLR
1.0000 g | Freq: Once | INTRAMUSCULAR | Status: AC
  Administered 2015-03-07: 1 g via INTRAMUSCULAR
  Filled 2015-03-07: qty 10

## 2015-03-07 MED ORDER — HYDROCORTISONE 2.5 % RE CREA: TOPICAL_CREAM | RECTAL | Status: DC

## 2015-03-07 MED ORDER — CEPHALEXIN 500 MG PO CAPS: 500.0000 mg | ORAL_CAPSULE | Freq: Four times a day (QID) | ORAL | Status: DC

## 2015-03-07 MED ORDER — HYDROCODONE-ACETAMINOPHEN 5-325 MG PO TABS
1.0000 | ORAL_TABLET | Freq: Once | ORAL | Status: AC
  Administered 2015-03-07: 1 via ORAL
  Filled 2015-03-07: qty 1

## 2015-03-07 NOTE — ED Provider Notes (Signed)
Pending UA, fluids, abd series  C/O hemorrhoid and rectal pain - bloody stool one wek ago, guaiac neg today Has constipation -   D/c delayed due to elevated Cr, leukocytosis, suprapubic catheter  UA marginally positive. Will cover with Keflex - culture pending. Hemorrhoid to be treated with topical - Anusol, dietary suggestions, stool softener, Miralax VSS - patient is well appearing. He has appointment already scheduled with Dr. Gwendolyn Grant next week (Tuesday, 03/11/15) and will have recheck of urine and creatinine (sees Dr. Eliott Nine on Wednesday, 03/12/15).   D/C home. The patient has been seen and evaluated with Dr. Silverio Lay who assisted with care plan.  Elpidio Anis, PA-C 03/07/15 2202  Richardean Canal, MD 03/07/15 2312

## 2015-03-07 NOTE — ED Notes (Signed)
[facility] contacted to tx patient home 

## 2015-03-07 NOTE — ED Provider Notes (Signed)
CSN: [id number]     Arrival date & time 03/07/15  1741 History   First MD Initiated Contact with Patient 03/07/15 1759     Chief Complaint  Patient presents with  . Rectal Pain   HPI Lawrence [name] is a 55 y.o. M PMH significant for HTN, CKD, HLD, PVD, IDDM presenting with a 1 week history of rectal pain. He endorses constipation (has not had a BM in 1 week) but states this is chronic for him. He endorses one episode of hematochezia (BRB, scant amount surrounding stool). He denies fevers, chills, CP, SOB, abdominal pain, diarrhea, weakness.   Past Medical History  Diagnosis Date  . Hypertension   . Chronic kidney disease   . Hypercholesteremia   . PONV (postoperative nausea and vomiting)   . Peripheral vascular disease (HCC)   . H/O hiatal hernia     hx of years ago   . Pneumonia 10/23/2013  . IDDM (insulin dependent diabetes mellitus) (HCC)   . Depression 08/2012    "just when foot first got cut off"   Past Surgical History  Procedure Laterality Date  . Amputation Right 05/03/2012    Procedure: Right First Ray AMPUTATION;  Surgeon: Eldred Manges, MD;  Location: [facility] OR;  Service: Orthopedics;  Laterality: Right;  . I&d extremity Right 05/29/2012    Procedure: IRRIGATION AND DEBRIDEMENT EXTREMITY- right foot;  Surgeon: Eldred Manges, MD;  Location: [facility] OR;  Service: Orthopedics;  Laterality: Right;  Right Foot Debridement, VAC Application  . Amputation Right 08/16/2012    Procedure: AMPUTATION BELOW KNEE;  Surgeon: Eldred Manges, MD;  Location: [facility] OR;  Service: Orthopedics;  Laterality: Right;  Right Below Knee Amputation  . Amputation Right 10/04/2012    Procedure: AMPUTATION BELOW KNEE REVISION;  Surgeon: Eldred Manges, MD;  Location: [facility] OR;  Service: Orthopedics;  Laterality: Right;  . Insertion of suprapubic catheter N/A 03/02/2013    Procedure: INSERTION OF SUPRAPUBIC CATHETER;  Surgeon: Su Grand, MD;  Location: [facility] ORS;  Service: Urology;  Laterality: N/A;  . Tonsillectomy  ~ 1967  . Av  fistula placement Right 11/01/2013    Procedure: RIGHT ARM ARTERIOVENOUS (AV) FISTULA CREATION;  Surgeon: Chuck Hint, MD;  Location: [facility] OR;  Service: Vascular;  Laterality: Right;  . Abdominal aortagram N/A 05/03/2012    Procedure: ABDOMINAL Ronny Flurry;  Surgeon: Sherren Kerns, MD;  Location: [facility] CATH LAB;  Service: Cardiovascular;  Laterality: N/A;  . Lower extremity angiogram Right 05/03/2012    Procedure: LOWER EXTREMITY ANGIOGRAM;  Surgeon: Sherren Kerns, MD;  Location: [facility] CATH LAB;  Service: Cardiovascular;  Laterality: Right;  rt leg angio   History reviewed. No pertinent family history. Social History  Substance Use Topics  . Smoking status: Never Smoker   . Smokeless tobacco: Never Used  . Alcohol Use: Yes     Comment: 10/23/2013 "couple times/month I'll have a beer"    Review of Systems  Ten systems are reviewed and are negative for acute change except as noted in the HPI  Allergies  Review of patient's allergies indicates no known allergies.  Home Medications   Prior to Admission medications   Medication Sig Start Date End Date Taking? Authorizing Provider  aspirin 81 MG tablet Take 81 mg by mouth daily.    Yes Historical Provider, MD  atorvastatin (LIPITOR) 40 MG tablet Take 1 tablet (40 mg total) by mouth daily. 03/04/15  Yes Tobey Grim, MD  Brinzolamide-Brimonidine [facility]) 1-0.2 % SUSP  Place 1 drop into the left eye 2 (two) times daily.   Yes Historical Provider, MD  calcitRIOL (ROCALTROL) 0.25 MCG capsule Take 1 capsule (0.25 mcg total) by mouth daily. 11/01/13  Yes Ardith Dark, MD  furosemide (LASIX) 40 MG tablet Take 1 tablet (40 mg total) by mouth 2 (two) times daily. 03/04/15  Yes Tobey Grim, MD  Insulin Glargine (LANTUS SOLOSTAR) 100 UNIT/ML Solostar Pen Inject 20 Units into the skin daily at 10 pm. 03/04/15  Yes Tobey Grim, MD  metoprolol tartrate (LOPRESSOR) 25 MG tablet Take 1 tablet (25 mg total) by mouth 2 (two) times daily.  03/04/15  Yes Tobey Grim, MD  solifenacin (VESICARE) 5 MG tablet Take 5 mg by mouth daily.   Yes Historical Provider, MD  ciprofloxacin (CIPRO) 500 MG tablet Take 1 tablet (500 mg total) by mouth 2 (two) times daily. One po bid x 7 days Patient not taking: Reported on 03/07/2015 05/05/14   Paula Libra, MD  EASY TOUCH PEN NEEDLES 31G X 8 MM MISC USE AS DIRECTED TO INJECT INSULIN. 10/09/14   Latrelle Dodrill, MD  HYDROcodone-acetaminophen (NORCO) 5-325 MG per tablet Take 1 tablet by mouth every 8 (eight) hours. Patient not taking: Reported on 03/07/2015 08/09/14   Tobey Grim, MD  HYDROcodone-acetaminophen (NORCO/VICODIN) 5-325 MG per tablet Take 1-2 tablets by mouth every 4 (four) hours as needed. 10/07/14   Marlon Pel, PA-C  oxybutynin (DITROPAN) 5 MG tablet Take 1 tablet (5 mg total) by mouth every 8 (eight) hours as needed for bladder spasms. Patient not taking: Reported on 03/07/2015 05/18/13   Marisa Severin, MD  Stann Ore INSULIN SYRINGE 30G X 1/2" 0.3 ML MISC USE ONE SYRINGE AT BEDTIME 11/15/13   [name], MD   BP 154/66 mmHg  Pulse 93  Resp 14  SpO2 100% Physical Exam  Constitutional: He appears well-developed and well-nourished. No distress.  HENT:  Head: Normocephalic and atraumatic.  Mouth/Throat: Oropharynx is clear and moist. No oropharyngeal exudate.  Eyes: Conjunctivae are normal. Pupils are equal, round, and reactive to light. Right eye exhibits no discharge. Left eye exhibits no discharge. No scleral icterus.  Neck: No tracheal deviation present.  Cardiovascular: Normal rate, regular rhythm, normal heart sounds and intact distal pulses.  Exam reveals no gallop and no friction rub.   No murmur heard. Pulmonary/Chest: Effort normal and breath sounds normal. No respiratory distress. He has no wheezes. He has no rales. He exhibits no tenderness.  Abdominal: Soft. Bowel sounds are normal. He exhibits no distension and no mass. There is no tenderness. There is no rebound  and no guarding.  Genitourinary:  Soft, light brown stool noted on DRE. 1.5 cm nonthrombosed external hemorrhoid. No masses or internal hemorrhoid palpated.  Suprapubic catheter  Musculoskeletal: He exhibits no edema.  Lymphadenopathy:    He has no cervical adenopathy.  Neurological: He is alert. Coordination normal.  Skin: Skin is warm and dry. No rash noted. He is not diaphoretic. No erythema.  Psychiatric: He has a normal mood and affect. His behavior is normal.  Nursing note and vitals reviewed.   ED Course  Procedures  Labs Review Labs Reviewed  CBC WITH DIFFERENTIAL/PLATELET - Abnormal; Notable for the following:    WBC 14.1 (*)    RBC 3.82 (*)    Hemoglobin 10.6 (*)    HCT 32.0 (*)    Platelets 144 (*)    Neutro Abs 11.9 (*)    Monocytes Absolute 1.3 (*)  All other components within normal limits  BASIC METABOLIC PANEL - Abnormal; Notable for the following:    CO2 21 (*)    Glucose, Bld 200 (*)    BUN 74 (*)    Creatinine, Ser 3.39 (*)    GFR calc non Af Amer 19 (*)    GFR calc Af Amer 22 (*)    All other components within normal limits  POC OCCULT BLOOD, ED   MDM   Final diagnoses:  None   Patient non-toxic appearing. Hemorrhoid on exam. Will get basic labs. Do not suspect GI bleed.   Nonspecific leukocytosis of 14.1. SCr of 3.39, elevated from baseline.  Hemoccult negative.  Discussed case with Dr. Silverio Lay who advised further workup for possible UTI given pt has suprapubic catheter.   Care handoff given to Elpidio Anis, PA-C at shift change. Pending UA, fluids, abdominal series.   Melton Krebs, PA-C 03/08/15 2230  Richardean Canal, MD 03/08/15 2249

## 2015-03-07 NOTE — Discharge Instructions (Signed)
[name],  Nice meeting you! Try magnesium citrate for your constipation. Please follow-up with your primary care provider. Return to the emergency department if you develop increased pain, notice rectal bleeding, become short of breath, have chest pain or headaches. Feel better soon!  S. Lane Hacker, PA-C  RECOMMEND MIRALAX UP TO 3 TIMES DAILY UNTIL BOWELS ARE MOVING. USE ANUSOL AS DIRECTED FOR HEMORRHOIDAL RELIEF. TAKE KEFLEX FOR URINARY TRACT INFECTION AND FOLLOW UP WITH DR. Gwendolyn [name] AS SCHEDULED FOR RESULTS OF THE URINE CULTURE. LET DR. Eliott [name] KNOW YOUR CREATININE IS ELEVATED ABOVE YOUR BASELINE WHEN YOU SEE HER AT YOUR NEXT APPOINTMENT ON 03/12/15. Urinary Tract Infection Urinary tract infections (UTIs) can develop anywhere along your urinary tract. Your urinary tract is your body's drainage system for removing wastes and extra water. Your urinary tract includes two kidneys, two ureters, a bladder, and a urethra. Your kidneys are a pair of bean-shaped organs. Each kidney is about the size of your fist. They are located below your ribs, one on each side of your spine. CAUSES Infections are caused by microbes, which are microscopic organisms, including fungi, viruses, and bacteria. These organisms are so small that they can only be seen through a microscope. Bacteria are the microbes that most commonly cause UTIs. SYMPTOMS  Symptoms of UTIs may vary by age and gender of the patient and by the location of the infection. Symptoms in young women typically include a frequent and intense urge to urinate and a painful, burning feeling in the bladder or urethra during urination. Older women and men are more likely to be tired, shaky, and weak and have muscle aches and abdominal pain. A fever may mean the infection is in your kidneys. Other symptoms of a kidney infection include pain in your back or sides below the ribs, nausea, and vomiting. DIAGNOSIS To diagnose a UTI, your caregiver will ask you  about your symptoms. Your caregiver will also ask you to provide a urine sample. The urine sample will be tested for bacteria and white blood cells. White blood cells are made by your body to help fight infection. TREATMENT  Typically, UTIs can be treated with medication. Because most UTIs are caused by a bacterial infection, they usually can be treated with the use of antibiotics. The choice of antibiotic and length of treatment depend on your symptoms and the type of bacteria causing your infection. HOME CARE INSTRUCTIONS  If you were prescribed antibiotics, take them exactly as your caregiver instructs you. Finish the medication even if you feel better after you have only taken some of the medication.  Drink enough water and fluids to keep your urine clear or pale yellow.  Avoid caffeine, tea, and carbonated beverages. They tend to irritate your bladder.  Empty your bladder often. Avoid holding urine for long periods of time.  Empty your bladder before and after sexual intercourse.  After a bowel movement, women should cleanse from front to back. Use each tissue only once. SEEK MEDICAL CARE IF:   You have back pain.  You develop a fever.  Your symptoms do not begin to resolve within 3 days. SEEK IMMEDIATE MEDICAL CARE IF:   You have severe back pain or lower abdominal pain.  You develop chills.  You have nausea or vomiting.  You have continued burning or discomfort with urination. MAKE SURE YOU:   Understand these instructions.  Will watch your condition.  Will get help right away if you are not doing well or get  worse.   This information is not intended to replace advice given to you by your health care provider. Make sure you discuss any questions you have with your health care provider.   Document Released: 11/04/2004 Document Revised: 10/16/2014 Document Reviewed: 03/05/2011 Elsevier Interactive Patient Education 2016 Elsevier Inc. High-Fiber Diet Fiber, also  called dietary fiber, is a type of carbohydrate found in fruits, vegetables, whole grains, and beans. A high-fiber diet can have many health benefits. Your health care provider may recommend a high-fiber diet to help:  Prevent constipation. Fiber can make your bowel movements more regular.  Lower your cholesterol.  Relieve hemorrhoids, uncomplicated diverticulosis, or irritable bowel syndrome.  Prevent overeating as part of a weight-loss plan.  Prevent heart disease, type 2 diabetes, and certain cancers. WHAT IS MY PLAN? The recommended daily intake of fiber includes:  38 grams for men under age 19.  30 grams for men over age 28.  25 grams for women under age 49.  21 grams for women over age 28. You can get the recommended daily intake of dietary fiber by eating a variety of fruits, vegetables, grains, and beans. Your health care provider may also recommend a fiber supplement if it is not possible to get enough fiber through your diet. WHAT DO I NEED TO KNOW ABOUT A HIGH-FIBER DIET?  Fiber supplements have not been widely studied for their effectiveness, so it is better to get fiber through food sources.  Always check the fiber content on thenutrition facts label of any prepackaged food. Look for foods that contain at least 5 grams of fiber per serving.  Ask your dietitian if you have questions about specific foods that are related to your condition, especially if those foods are not listed in the following section.  Increase your daily fiber consumption gradually. Increasing your intake of dietary fiber too quickly may cause bloating, cramping, or gas.  Drink plenty of water. Water helps you to digest fiber. WHAT FOODS CAN I EAT? Grains Whole-grain breads. Multigrain cereal. Oats and oatmeal. Brown rice. Barley. Bulgur wheat. Millet. Bran muffins. Popcorn. Rye wafer crackers. Vegetables Sweet potatoes. Spinach. Kale. Artichokes. Cabbage. Broccoli. Green peas. Carrots.  Squash. Fruits Berries. Pears. Apples. Oranges. Avocados. Prunes and raisins. Dried figs. Meats and Other Protein Sources Navy, kidney, pinto, and soy beans. Split peas. Lentils. Nuts and seeds. Dairy Fiber-fortified yogurt. Beverages Fiber-fortified soy milk. Fiber-fortified orange juice. Other Fiber bars. The items listed above may not be a complete list of recommended foods or beverages. Contact your dietitian for more options. WHAT FOODS ARE NOT RECOMMENDED? Grains White bread. Pasta made with refined flour. White rice. Vegetables Fried potatoes. Canned vegetables. Well-cooked vegetables.  Fruits Fruit juice. Cooked, strained fruit. Meats and Other Protein Sources Fatty cuts of meat. Fried Environmental education officer or fried fish. Dairy Milk. Yogurt. Cream cheese. Sour cream. Beverages Soft drinks. Other Cakes and pastries. Butter and oils. The items listed above may not be a complete list of foods and beverages to avoid. Contact your dietitian for more information. WHAT ARE SOME TIPS FOR INCLUDING HIGH-FIBER FOODS IN MY DIET?  Eat a wide variety of high-fiber foods.  Make sure that half of all grains consumed each day are whole grains.  Replace breads and cereals made from refined flour or white flour with whole-grain breads and cereals.  Replace white rice with brown rice, bulgur wheat, or millet.  Start the day with a breakfast that is high in fiber, such as a cereal that contains at least 5  grams of fiber per serving.  Use beans in place of meat in soups, salads, or pasta.  Eat high-fiber snacks, such as berries, raw vegetables, nuts, or popcorn.   This information is not intended to replace advice given to you by your health care provider. Make sure you discuss any questions you have with your health care provider.   Document Released: 01/25/2005 Document Revised: 02/15/2014 Document Reviewed: 07/10/2013 Elsevier Interactive Patient Education 2016 [employer]. Constipation,  Adult Constipation is when a person has fewer than three bowel movements a week, has difficulty having a bowel movement, or has stools that are dry, hard, or larger than normal. As people grow older, constipation is more common. A low-fiber diet, not taking in enough fluids, and taking certain medicines may make constipation worse.  CAUSES   Certain medicines, such as antidepressants, pain medicine, iron supplements, antacids, and water pills.   Certain diseases, such as diabetes, irritable bowel syndrome (IBS), thyroid disease, or depression.   Not drinking enough water.   Not eating enough fiber-rich foods.   Stress or travel.   Lack of physical activity or exercise.   Ignoring the urge to have a bowel movement.   Using laxatives too much.  SIGNS AND SYMPTOMS   Having fewer than three bowel movements a week.   Straining to have a bowel movement.   Having stools that are hard, dry, or larger than normal.   Feeling full or bloated.   Pain in the lower abdomen.   Not feeling relief after having a bowel movement.  DIAGNOSIS  Your health care provider will take a medical history and perform a physical exam. Further testing may be done for severe constipation. Some tests may include:  A barium enema X-ray to examine your rectum, colon, and, sometimes, your small intestine.   A sigmoidoscopy to examine your lower colon.   A colonoscopy to examine your entire colon. TREATMENT  Treatment will depend on the severity of your constipation and what is causing it. Some dietary treatments include drinking more fluids and eating more fiber-rich foods. Lifestyle treatments may include regular exercise. If these diet and lifestyle recommendations do not help, your health care provider may recommend taking over-the-counter laxative medicines to help you have bowel movements. Prescription medicines may be prescribed if over-the-counter medicines do not work.  HOME CARE  INSTRUCTIONS   Eat foods that have a lot of fiber, such as fruits, vegetables, whole grains, and beans.  Limit foods high in fat and processed sugars, such as french fries, hamburgers, cookies, candies, and soda.   A fiber supplement may be added to your diet if you cannot get enough fiber from foods.   Drink enough fluids to keep your urine clear or pale yellow.   Exercise regularly or as directed by your health care provider.   Go to the restroom when you have the urge to go. Do not hold it.   Only take over-the-counter or prescription medicines as directed by your health care provider. Do not take other medicines for constipation without talking to your health care provider first.  SEEK IMMEDIATE MEDICAL CARE IF:   You have bright red blood in your stool.   Your constipation lasts for more than 4 days or gets worse.   You have abdominal or rectal pain.   You have thin, pencil-like stools.   You have unexplained weight loss. MAKE SURE YOU:   Understand these instructions.  Will watch your condition.  Will get help right away  if you are not doing well or get worse.   This information is not intended to replace advice given to you by your health care provider. Make sure you discuss any questions you have with your health care provider.   Document Released: 10/24/2003 Document Revised: 02/15/2014 Document Reviewed: 11/06/2012 Elsevier Interactive Patient Education 2016 [employer].   Hemorrhoids Hemorrhoids are puffy (swollen) veins around the rectum or anus. Hemorrhoids can cause pain, itching, bleeding, or irritation. HOME CARE  Eat foods with fiber, such as whole grains, beans, nuts, fruits, and vegetables. Ask your doctor about taking products with added fiber in them (fibersupplements).  Drink enough fluid to keep your pee (urine) clear or pale yellow.  Exercise often.  Go to the bathroom when you have the urge to poop. Do not wait.  Avoid  straining to poop (bowel movement).  Keep the butt area dry and clean. Use wet toilet paper or moist paper towels.  Medicated creams and medicine inserted into the anus (anal suppository) may be used or applied as told.  Only take medicine as told by your doctor.  Take a warm water bath (sitz bath) for 15-20 minutes to ease pain. Do this 3-4 times a day.  Place ice packs on the area if it is tender or puffy. Use the ice packs between the warm water baths.  Put ice in a plastic bag.  Place a towel between your skin and the bag.  Leave the ice on for 15-20 minutes, 03-04 times a day.  Do not use a donut-shaped pillow or sit on the toilet for a long time. GET HELP RIGHT AWAY IF:   You have more pain that is not controlled by treatment or medicine.  You have bleeding that will not stop.  You have trouble or are unable to poop (bowel movement).  You have pain or puffiness outside the area of the hemorrhoids. MAKE SURE YOU:   Understand these instructions.  Will watch your condition.  Will get help right away if you are not doing well or get worse.   This information is not intended to replace advice given to you by your health care provider. Make sure you discuss any questions you have with your health care provider.   Document Released: 11/04/2007 Document Revised: 01/12/2012 Document Reviewed: 12/07/2011 Elsevier Interactive Patient Education [employer]

## 2015-03-07 NOTE — ED Notes (Signed)
Per EMS: Pt complaining of rectal pain. Has not had a BM in 1 week. Pt states he noticed some bright red blood in stool last week. Has attempted to use laxities with no success.

## 2015-03-09 LAB — URINE CULTURE

## 2015-03-10 ENCOUNTER — Emergency Department (HOSPITAL_COMMUNITY)
Admission: EM | Admit: 2015-03-10 | Discharge: 2015-03-10 | Disposition: A | Discharge status: Home / Self Care | Payer: Medicare Other | Source: Home / Self Care | Attending: Emergency Medicine | Admitting: Emergency Medicine

## 2015-03-10 ENCOUNTER — Encounter (HOSPITAL_COMMUNITY): Payer: Self-pay | Admitting: Emergency Medicine

## 2015-03-10 DIAGNOSIS — K644 Residual hemorrhoidal skin tags: Secondary | ICD-10-CM

## 2015-03-10 DIAGNOSIS — N189 Chronic kidney disease, unspecified: Secondary | ICD-10-CM

## 2015-03-10 DIAGNOSIS — Z8701 Personal history of pneumonia (recurrent): Secondary | ICD-10-CM | POA: Insufficient documentation

## 2015-03-10 DIAGNOSIS — E119 Type 2 diabetes mellitus without complications: Secondary | ICD-10-CM

## 2015-03-10 DIAGNOSIS — F329 Major depressive disorder, single episode, unspecified: Secondary | ICD-10-CM | POA: Insufficient documentation

## 2015-03-10 DIAGNOSIS — E782 Mixed hyperlipidemia: Secondary | ICD-10-CM

## 2015-03-10 DIAGNOSIS — Z79899 Other long term (current) drug therapy: Secondary | ICD-10-CM

## 2015-03-10 DIAGNOSIS — Z794 Long term (current) use of insulin: Secondary | ICD-10-CM

## 2015-03-10 DIAGNOSIS — I129 Hypertensive chronic kidney disease with stage 1 through stage 4 chronic kidney disease, or unspecified chronic kidney disease: Secondary | ICD-10-CM | POA: Insufficient documentation

## 2015-03-10 DIAGNOSIS — Z7982 Long term (current) use of aspirin: Secondary | ICD-10-CM

## 2015-03-10 DIAGNOSIS — Z792 Long term (current) use of antibiotics: Secondary | ICD-10-CM

## 2015-03-10 LAB — POC OCCULT BLOOD, ED: Fecal Occult Bld: NEGATIVE

## 2015-03-10 MED ORDER — BUPIVACAINE-EPINEPHRINE (PF) 0.25% -1:200000 IJ SOLN
10.0000 mL | Freq: Once | INTRAMUSCULAR | Status: AC
  Administered 2015-03-10: 10 mL
  Filled 2015-03-10: qty 30

## 2015-03-10 MED ORDER — HYDROCODONE-ACETAMINOPHEN 5-325 MG PO TABS
1.0000 | ORAL_TABLET | Freq: Once | ORAL | Status: AC
  Administered 2015-03-10: 1 via ORAL
  Filled 2015-03-10: qty 1

## 2015-03-10 NOTE — ED Notes (Signed)
Patient denies pain and is resting comfortably.  

## 2015-03-10 NOTE — ED Notes (Signed)
Per EMS, pt from home called EMS to take him to [facility] to "lance his hemroids" b/c he "can't lay on them anymore."  Hx of CHF, HNT, Diabeties, and below knee amputation (right leg).  CBG was 78 however EMS stated he has "fruity breath." ."

## 2015-03-10 NOTE — ED Notes (Signed)
[facility] called to transport pt home.  Social worker in to speak to pt

## 2015-03-10 NOTE — ED Notes (Signed)
Pt to ED via [facility] with complaints of hemorrhoids.  Pt was also here on Fri. Via [facility] for same.  St's they gave him cream but it hasn't helped

## 2015-03-10 NOTE — ED Provider Notes (Signed)
CSN: [id number]     Arrival date & time 03/10/15  1640 History   First MD Initiated Contact with Patient 03/10/15 1717     Chief Complaint  Patient presents with  . Hemorrhoids   (Consider location/radiation/quality/duration/timing/severity/associated sxs/prior Treatment) HPI 55 y.o. male with a hx of HTN, CKD, HLD, presents to the Emergency Department by EMS today complaining of continued rectal pain from hemorrhoids. He was seen on 03/07/15 for same complaint. DCed with Anusol and Miralax with f/u to PCP on 03/11/15. Currently patient states that the pain is worse and is a 10/10 sharp around rectum. No bleeding. No CP/SOB/ABD pain. No Dysuria. He tried the anusol with minimal relief. He did not try the Miralax. Has not had a BM x1 week. No fevers. No headaches. No other symptoms noted.    Past Medical History  Diagnosis Date  . Hypertension   . Chronic kidney disease   . Hypercholesteremia   . PONV (postoperative nausea and vomiting)   . Peripheral vascular disease (HCC)   . H/O hiatal hernia     hx of years ago   . Pneumonia 10/23/2013  . IDDM (insulin dependent diabetes mellitus) (HCC)   . Depression 08/2012    "just when foot first got cut off"   Past Surgical History  Procedure Laterality Date  . Amputation Right 05/03/2012    Procedure: Right First Ray AMPUTATION;  Surgeon: Eldred Manges, MD;  Location: [facility] OR;  Service: Orthopedics;  Laterality: Right;  . I&d extremity Right 05/29/2012    Procedure: IRRIGATION AND DEBRIDEMENT EXTREMITY- right foot;  Surgeon: Eldred Manges, MD;  Location: [facility] OR;  Service: Orthopedics;  Laterality: Right;  Right Foot Debridement, VAC Application  . Amputation Right 08/16/2012    Procedure: AMPUTATION BELOW KNEE;  Surgeon: Eldred Manges, MD;  Location: [facility] OR;  Service: Orthopedics;  Laterality: Right;  Right Below Knee Amputation  . Amputation Right 10/04/2012    Procedure: AMPUTATION BELOW KNEE REVISION;  Surgeon: Eldred Manges, MD;  Location: [facility] OR;   Service: Orthopedics;  Laterality: Right;  . Insertion of suprapubic catheter N/A 03/02/2013    Procedure: INSERTION OF SUPRAPUBIC CATHETER;  Surgeon: Su Grand, MD;  Location: [facility] ORS;  Service: Urology;  Laterality: N/A;  . Tonsillectomy  ~ 1967  . Av fistula placement Right 11/01/2013    Procedure: RIGHT ARM ARTERIOVENOUS (AV) FISTULA CREATION;  Surgeon: Chuck Hint, MD;  Location: [facility] OR;  Service: Vascular;  Laterality: Right;  . Abdominal aortagram N/A 05/03/2012    Procedure: ABDOMINAL Ronny Flurry;  Surgeon: Sherren Kerns, MD;  Location: [facility] CATH LAB;  Service: Cardiovascular;  Laterality: N/A;  . Lower extremity angiogram Right 05/03/2012    Procedure: LOWER EXTREMITY ANGIOGRAM;  Surgeon: Sherren Kerns, MD;  Location: [facility] CATH LAB;  Service: Cardiovascular;  Laterality: Right;  rt leg angio   No family history on file. Social History  Substance Use Topics  . Smoking status: Never Smoker   . Smokeless tobacco: Never Used  . Alcohol Use: Yes     Comment: 10/23/2013 "couple times/month I'll have a beer"    Review of Systems ROS reviewed and all are negative for acute change except as noted in the HPI.  Allergies  Review of patient's allergies indicates no known allergies.  Home Medications   Prior to Admission medications   Medication Sig Start Date End Date Taking? Authorizing Provider  aspirin 81 MG tablet Take 81 mg by mouth daily.  Historical Provider, MD  atorvastatin (LIPITOR) 40 MG tablet Take 1 tablet (40 mg total) by mouth daily. 03/04/15   Tobey Grim, MD  Brinzolamide-Brimonidine [facility]) 1-0.2 % SUSP Place 1 drop into the left eye 2 (two) times daily.    Historical Provider, MD  calcitRIOL (ROCALTROL) 0.25 MCG capsule Take 1 capsule (0.25 mcg total) by mouth daily. 11/01/13   Ardith Dark, MD  cephALEXin (KEFLEX) 500 MG capsule Take 1 capsule (500 mg total) by mouth 4 (four) times daily. 03/07/15   Elpidio Anis, PA-C  ciprofloxacin (CIPRO) 500 MG  tablet Take 1 tablet (500 mg total) by mouth 2 (two) times daily. One po bid x 7 days Patient not taking: Reported on 03/07/2015 05/05/14   Paula Libra, MD  EASY TOUCH PEN NEEDLES 31G X 8 MM MISC USE AS DIRECTED TO INJECT INSULIN. 10/09/14   Latrelle Dodrill, MD  furosemide (LASIX) 40 MG tablet Take 1 tablet (40 mg total) by mouth 2 (two) times daily. 03/04/15   Tobey Grim, MD  HYDROcodone-acetaminophen (NORCO) 5-325 MG per tablet Take 1 tablet by mouth every 8 (eight) hours. Patient not taking: Reported on 03/07/2015 08/09/14   Tobey Grim, MD  HYDROcodone-acetaminophen (NORCO/VICODIN) 5-325 MG per tablet Take 1-2 tablets by mouth every 4 (four) hours as needed. 10/07/14   Tiffany Neva Seat, PA-C  hydrocortisone (ANUSOL-HC) 2.5 % rectal cream Apply rectally 2 times daily 03/07/15   Melton Krebs, PA-C  Insulin Glargine (LANTUS SOLOSTAR) 100 UNIT/ML Solostar Pen Inject 20 Units into the skin daily at 10 pm. 03/04/15   Tobey Grim, MD  metoprolol tartrate (LOPRESSOR) 25 MG tablet Take 1 tablet (25 mg total) by mouth 2 (two) times daily. 03/04/15   Tobey Grim, MD  oxybutynin (DITROPAN) 5 MG tablet Take 1 tablet (5 mg total) by mouth every 8 (eight) hours as needed for bladder spasms. Patient not taking: Reported on 03/07/2015 05/18/13   Marisa Severin, MD  solifenacin (VESICARE) 5 MG tablet Take 5 mg by mouth daily.    Historical Provider, MD  Stann Ore INSULIN SYRINGE 30G X 1/2" 0.3 ML MISC USE ONE SYRINGE AT BEDTIME 11/15/13   [name], MD   BP 132/44 mmHg  Pulse 94  Temp(Src) 98 F (36.7 C) (Oral)  Resp 20  Ht  (1.651 m)  Wt 77.111 kg  BMI 28.29 kg/m2  SpO2 100%   Physical Exam  Constitutional: He is oriented to person, place, and time. He appears well-developed and well-nourished.  HENT:  Head: Normocephalic and atraumatic.  Eyes: EOM are normal.  Neck: Normal range of motion. Neck supple.  Cardiovascular: Normal rate, regular rhythm and normal heart sounds.    Pulmonary/Chest: Effort normal and breath sounds normal.  Abdominal: Soft. Normal appearance and bowel sounds are normal. He exhibits no distension and no ascites. There is no tenderness. There is no rigidity, no rebound, no guarding, no CVA tenderness, no tenderness at McBurney's point and negative Murphy's sign.  Genitourinary: Rectal exam shows external hemorrhoid and tenderness. Rectal exam shows no internal hemorrhoid, no fissure and no mass.  Chaperone was present during rectal exam. No bleeding noted.    Musculoskeletal: Normal range of motion.  Neurological: He is alert and oriented to person, place, and time.  Skin: Skin is warm and dry.  Psychiatric: He has a normal mood and affect. His behavior is normal. Thought content normal.  Nursing note and vitals reviewed.   ED Course  Procedures (including critical  care time)  7:32 PM A block was placed on the patient's external hemorrhoid. Verbal consent was obtain with risks, benefits, and alternatives discussed. The patient expressed his understanding of the procedure being preformed. The patient's identity was confirmed verbally and with the patient's arm band. The indication was for pain relief. The patient was prepped and draped in the usual sterile fashion. A 24G needle was used with a mixture of Marcaine w/ Epinephrine. 1cc was used and injected underneath the external hemorrhoid. Pain was improved after application. The patient tolerated the procedure well with no immediate complications.   Labs Review Labs Reviewed  POC OCCULT BLOOD, ED   Imaging Review No results found. I have personally reviewed and evaluated these images and lab results as part of my medical decision-making.   EKG Interpretation None      MDM  I have reviewed relevant laboratory values. I have reviewed the relevant previous healthcare records. I have reviewed EMS Documentation. I obtained HPI from historian. Patient discussed with supervising  physician  ED Course:  Assessment: 39y M with hx HTN, CKD, HLD presents with worsening hemorrhoid pain since his previous visit to the ED on 03/07/15. No bleeding noted. No other symptoms since last visit. Being actively treated for UTI with Keflex. No abd pain. Afebrile. Has tried Anusol with little relief. Has not tried Miralax. Guaiac negative. Rectal exam showed external hemorrhoid. Not thrombosed. No bleeding noted. Injected with Marcaine w/ Epi with immediate relief. Case management was consulted for assistance with management of external hemorrhoid and management due to disability. Will DC with follow up to PCP at scheduled appointment tomorrow for further management of symptoms.    Patient is in no acute distress. Vital Signs are stable. Patient is able to ambulate. Patient able to tolerate PO.   Disposition/Plan:  DC Home Additional Verbal discharge instructions given and discussed with patient.  Pt Instructed to f/u with PCP tomorrow at scheduled appointment  Return precautions given Pt acknowledges and agrees with plan  Supervising Physician Rolland Porter, MD   Final diagnoses:  External hemorrhoid    Audry Pili, PA-C 03/10/15 1835  Audry Pili, PA-C 03/10/15 1945  Rolland Porter, MD 03/15/15 [phone]

## 2015-03-10 NOTE — Discharge Instructions (Signed)
Please read and follow all provided instructions.  Your diagnoses today include:  1. External hemorrhoid    Tests performed today include:  Vital signs. See below for your results today.   Medications prescribed:   None  Home care instructions:  Follow any educational materials contained in this packet.  Follow-up instructions: Please follow-up with your primary care provider at your scheduled appointment. You may need a referral to a General Surgeon for treatment of External Hemorrhoids.   Return instructions:   Please return to the Emergency Department if you do not get better, if you get worse, or new symptoms OR  - Fever (temperature greater than 101.56F)  - Bleeding that does not stop with holding pressure to the area    -Severe pain (please note that you may be more sore the day after your accident)  - Chest Pain  - Difficulty breathing  - Severe nausea or vomiting  - Inability to tolerate food and liquids  - Passing out  - Skin becoming red around your wounds  - Change in mental status (confusion or lethargy)  - New numbness or weakness     Please return if you have any other emergent concerns.  Additional Information:  Your vital signs today were: BP 132/44 mmHg   Pulse 94   Temp(Src) 98 F (36.7 C) (Oral)   Resp 20   Ht  (1.651 m)   Wt 77.111 kg   BMI 28.29 kg/m2   SpO2 100% If your blood pressure (BP) was elevated above 135/85 this visit, please have this repeated by your doctor within one month. ---------------

## 2015-03-11 ENCOUNTER — Ambulatory Visit (INDEPENDENT_AMBULATORY_CARE_PROVIDER_SITE_OTHER): Payer: Medicare Other | Admitting: Family Medicine

## 2015-03-11 ENCOUNTER — Encounter (HOSPITAL_COMMUNITY): Payer: Self-pay

## 2015-03-11 ENCOUNTER — Encounter: Payer: Self-pay | Admitting: Family Medicine

## 2015-03-11 ENCOUNTER — Inpatient Hospital Stay (HOSPITAL_COMMUNITY)
Admission: EM | Admit: 2015-03-11 | Discharge: 2015-03-16 | DRG: 682 | Disposition: A | Discharge status: Skilled Nursing Facility | Payer: Medicare Other | Attending: Family Medicine | Admitting: Family Medicine

## 2015-03-11 VITALS — BP 124/59 | HR 103 | Temp 97.5°F | Ht 65.0 in

## 2015-03-11 DIAGNOSIS — Z794 Long term (current) use of insulin: Secondary | ICD-10-CM | POA: Diagnosis not present

## 2015-03-11 DIAGNOSIS — M47812 Spondylosis without myelopathy or radiculopathy, cervical region: Secondary | ICD-10-CM | POA: Diagnosis not present

## 2015-03-11 DIAGNOSIS — Z79899 Other long term (current) drug therapy: Secondary | ICD-10-CM

## 2015-03-11 DIAGNOSIS — M4802 Spinal stenosis, cervical region: Secondary | ICD-10-CM | POA: Diagnosis not present

## 2015-03-11 DIAGNOSIS — N2581 Secondary hyperparathyroidism of renal origin: Secondary | ICD-10-CM | POA: Diagnosis present

## 2015-03-11 DIAGNOSIS — R531 Weakness: Secondary | ICD-10-CM | POA: Diagnosis not present

## 2015-03-11 DIAGNOSIS — H409 Unspecified glaucoma: Secondary | ICD-10-CM | POA: Diagnosis present

## 2015-03-11 DIAGNOSIS — E1122 Type 2 diabetes mellitus with diabetic chronic kidney disease: Secondary | ICD-10-CM | POA: Diagnosis present

## 2015-03-11 DIAGNOSIS — R5381 Other malaise: Secondary | ICD-10-CM | POA: Diagnosis present

## 2015-03-11 DIAGNOSIS — M62521 Muscle wasting and atrophy, not elsewhere classified, right upper arm: Secondary | ICD-10-CM | POA: Diagnosis not present

## 2015-03-11 DIAGNOSIS — Z7982 Long term (current) use of aspirin: Secondary | ICD-10-CM | POA: Diagnosis not present

## 2015-03-11 DIAGNOSIS — Z6831 Body mass index (BMI) 31.0-31.9, adult: Secondary | ICD-10-CM | POA: Diagnosis not present

## 2015-03-11 DIAGNOSIS — I110 Hypertensive heart disease with heart failure: Secondary | ICD-10-CM | POA: Diagnosis present

## 2015-03-11 DIAGNOSIS — Z62810 Personal history of physical and sexual abuse in childhood: Secondary | ICD-10-CM | POA: Diagnosis present

## 2015-03-11 DIAGNOSIS — N181 Chronic kidney disease, stage 1: Secondary | ICD-10-CM

## 2015-03-11 DIAGNOSIS — I5032 Chronic diastolic (congestive) heart failure: Secondary | ICD-10-CM | POA: Diagnosis present

## 2015-03-11 DIAGNOSIS — E43 Unspecified severe protein-calorie malnutrition: Secondary | ICD-10-CM | POA: Diagnosis not present

## 2015-03-11 DIAGNOSIS — K649 Unspecified hemorrhoids: Secondary | ICD-10-CM

## 2015-03-11 DIAGNOSIS — R339 Retention of urine, unspecified: Secondary | ICD-10-CM | POA: Diagnosis not present

## 2015-03-11 DIAGNOSIS — E7800 Pure hypercholesterolemia, unspecified: Secondary | ICD-10-CM | POA: Diagnosis present

## 2015-03-11 DIAGNOSIS — K644 Residual hemorrhoidal skin tags: Secondary | ICD-10-CM | POA: Diagnosis not present

## 2015-03-11 DIAGNOSIS — R748 Abnormal levels of other serum enzymes: Secondary | ICD-10-CM | POA: Diagnosis not present

## 2015-03-11 DIAGNOSIS — E860 Dehydration: Secondary | ICD-10-CM | POA: Diagnosis present

## 2015-03-11 DIAGNOSIS — Z89511 Acquired absence of right leg below knee: Secondary | ICD-10-CM

## 2015-03-11 DIAGNOSIS — N3289 Other specified disorders of bladder: Secondary | ICD-10-CM | POA: Diagnosis not present

## 2015-03-11 DIAGNOSIS — R160 Hepatomegaly, not elsewhere classified: Secondary | ICD-10-CM

## 2015-03-11 DIAGNOSIS — E1142 Type 2 diabetes mellitus with diabetic polyneuropathy: Secondary | ICD-10-CM | POA: Diagnosis present

## 2015-03-11 DIAGNOSIS — I130 Hypertensive heart and chronic kidney disease with heart failure and stage 1 through stage 4 chronic kidney disease, or unspecified chronic kidney disease: Secondary | ICD-10-CM | POA: Diagnosis present

## 2015-03-11 DIAGNOSIS — N179 Acute kidney failure, unspecified: Principal | ICD-10-CM | POA: Diagnosis present

## 2015-03-11 DIAGNOSIS — E119 Type 2 diabetes mellitus without complications: Secondary | ICD-10-CM | POA: Diagnosis present

## 2015-03-11 DIAGNOSIS — N184 Chronic kidney disease, stage 4 (severe): Secondary | ICD-10-CM | POA: Diagnosis present

## 2015-03-11 DIAGNOSIS — N390 Urinary tract infection, site not specified: Secondary | ICD-10-CM | POA: Diagnosis not present

## 2015-03-11 DIAGNOSIS — M6248 Contracture of muscle, other site: Secondary | ICD-10-CM | POA: Diagnosis not present

## 2015-03-11 DIAGNOSIS — M50221 Other cervical disc displacement at C4-C5 level: Secondary | ICD-10-CM | POA: Diagnosis not present

## 2015-03-11 DIAGNOSIS — M5021 Other cervical disc displacement,  high cervical region: Secondary | ICD-10-CM | POA: Diagnosis not present

## 2015-03-11 LAB — CBC
HCT: 29.5 % — ABNORMAL LOW (ref 39.0–52.0)
Hemoglobin: 9.6 g/dL — ABNORMAL LOW (ref 13.0–17.0)
MCH: 26.9 pg (ref 26.0–34.0)
MCHC: 32.5 g/dL (ref 30.0–36.0)
MCV: 82.6 fL (ref 78.0–100.0)
PLATELETS: 218 10*3/uL (ref 150–400)
RBC: 3.57 MIL/uL — ABNORMAL LOW (ref 4.22–5.81)
RDW: 15.3 % (ref 11.5–15.5)
WBC: 15.1 10*3/uL — ABNORMAL HIGH (ref 4.0–10.5)

## 2015-03-11 LAB — CREATININE, SERUM
CREATININE: 4.87 mg/dL — AB (ref 0.61–1.24)
GFR calc Af Amer: 14 mL/min — ABNORMAL LOW (ref >60)
GFR calc non Af Amer: 12 mL/min — ABNORMAL LOW (ref >60)

## 2015-03-11 LAB — CBC WITH DIFFERENTIAL/PLATELET
BASOS ABS: 0 10*3/uL (ref 0.0–0.1)
Basophils Relative: 0 %
EOS ABS: 0.2 10*3/uL (ref 0.0–0.7)
Eosinophils Relative: 1 %
HCT: 30 % — ABNORMAL LOW (ref 39.0–52.0)
Hemoglobin: 9.8 g/dL — ABNORMAL LOW (ref 13.0–17.0)
LYMPHS PCT: 9 %
Lymphs Abs: 1.5 10*3/uL (ref 0.7–4.0)
MCH: 27.1 pg (ref 26.0–34.0)
MCHC: 32.7 g/dL (ref 30.0–36.0)
MCV: 82.9 fL (ref 78.0–100.0)
Monocytes Absolute: 0.9 10*3/uL (ref 0.1–1.0)
Monocytes Relative: 5 %
NEUTROS PCT: 85 %
Neutro Abs: 14.5 10*3/uL — ABNORMAL HIGH (ref 1.7–7.7)
PLATELETS: 214 10*3/uL (ref 150–400)
RBC: 3.62 MIL/uL — AB (ref 4.22–5.81)
RDW: 15.3 % (ref 11.5–15.5)
WBC: 17.1 10*3/uL — ABNORMAL HIGH (ref 4.0–10.5)

## 2015-03-11 LAB — I-STAT CHEM 8, ED
BUN: 80 mg/dL — ABNORMAL HIGH (ref 6–20)
Calcium, Ion: 1.05 mmol/L — ABNORMAL LOW (ref 1.12–1.23)
Chloride: 100 mmol/L — ABNORMAL LOW (ref 101–111)
Creatinine, Ser: 4.5 mg/dL — ABNORMAL HIGH (ref 0.61–1.24)
Glucose, Bld: 48 mg/dL — ABNORMAL LOW (ref 65–99)
HCT: 35 % — ABNORMAL LOW (ref 39.0–52.0)
Hemoglobin: 11.9 g/dL — ABNORMAL LOW (ref 13.0–17.0)
Potassium: 3.7 mmol/L (ref 3.5–5.1)
Sodium: 133 mmol/L — ABNORMAL LOW (ref 135–145)
TCO2: 14 mmol/L (ref 0–100)

## 2015-03-11 LAB — COMPREHENSIVE METABOLIC PANEL
ALK PHOS: 164 U/L — AB (ref 38–126)
ALT: 19 U/L (ref 17–63)
ANION GAP: 25 — AB (ref 5–15)
AST: 28 U/L (ref 15–41)
Albumin: 2.4 g/dL — ABNORMAL LOW (ref 3.5–5.0)
BUN: 91 mg/dL — ABNORMAL HIGH (ref 6–20)
CALCIUM: 8.6 mg/dL — AB (ref 8.9–10.3)
CO2: 13 mmol/L — AB (ref 22–32)
Chloride: 97 mmol/L — ABNORMAL LOW (ref 101–111)
Creatinine, Ser: 4.87 mg/dL — ABNORMAL HIGH (ref 0.61–1.24)
GFR, EST AFRICAN AMERICAN: 14 mL/min — AB (ref >60)
GFR, EST NON AFRICAN AMERICAN: 12 mL/min — AB (ref >60)
Glucose, Bld: 36 mg/dL — CL (ref 65–99)
Potassium: 3.9 mmol/L (ref 3.5–5.1)
SODIUM: 135 mmol/L (ref 135–145)
Total Bilirubin: 1.1 mg/dL (ref 0.3–1.2)
Total Protein: 6.8 g/dL (ref 6.5–8.1)

## 2015-03-11 LAB — CBG MONITORING, ED: GLUCOSE-CAPILLARY: 52 mg/dL — AB (ref 65–99)

## 2015-03-11 LAB — GLUCOSE, CAPILLARY: GLUCOSE-CAPILLARY: 72 mg/dL (ref 65–99)

## 2015-03-11 MED ORDER — CEPHALEXIN 500 MG PO CAPS
500.0000 mg | ORAL_CAPSULE | Freq: Four times a day (QID) | ORAL | Status: DC
  Administered 2015-03-11: 500 mg via ORAL
  Filled 2015-03-11: qty 1

## 2015-03-11 MED ORDER — ASPIRIN 81 MG PO CHEW
81.0000 mg | CHEWABLE_TABLET | Freq: Every day | ORAL | Status: DC
  Administered 2015-03-11 – 2015-03-16 (×6): 81 mg via ORAL
  Filled 2015-03-11 (×8): qty 1

## 2015-03-11 MED ORDER — PRAMOXINE HCL 1 % RE FOAM
Freq: Three times a day (TID) | RECTAL | Status: DC | PRN
  Administered 2015-03-12: 1 via RECTAL
  Filled 2015-03-11: qty 15

## 2015-03-11 MED ORDER — HEPARIN SODIUM (PORCINE) 5000 UNIT/ML IJ SOLN
5000.0000 [IU] | Freq: Three times a day (TID) | INTRAMUSCULAR | Status: DC
  Administered 2015-03-11 – 2015-03-16 (×12): 5000 [IU] via SUBCUTANEOUS
  Filled 2015-03-11 (×10): qty 1

## 2015-03-11 MED ORDER — SODIUM CHLORIDE 0.9 % IV SOLN
INTRAVENOUS | Status: DC
  Administered 2015-03-12: 08:00:00 via INTRAVENOUS

## 2015-03-11 MED ORDER — INSULIN ASPART 100 UNIT/ML ~~LOC~~ SOLN
0.0000 [IU] | Freq: Every day | SUBCUTANEOUS | Status: DC
  Administered 2015-03-13 – 2015-03-15 (×3): 3 [IU] via SUBCUTANEOUS

## 2015-03-11 MED ORDER — METOPROLOL TARTRATE 25 MG PO TABS
25.0000 mg | ORAL_TABLET | Freq: Two times a day (BID) | ORAL | Status: DC
  Administered 2015-03-11 – 2015-03-16 (×10): 25 mg via ORAL
  Filled 2015-03-11 (×10): qty 1

## 2015-03-11 MED ORDER — SODIUM CHLORIDE 0.9 % IV SOLN
Freq: Once | INTRAVENOUS | Status: AC
  Administered 2015-03-11: 14:00:00 via INTRAVENOUS

## 2015-03-11 MED ORDER — DARIFENACIN HYDROBROMIDE ER 7.5 MG PO TB24
7.5000 mg | ORAL_TABLET | Freq: Every day | ORAL | Status: DC
  Administered 2015-03-12 – 2015-03-16 (×5): 7.5 mg via ORAL
  Filled 2015-03-11 (×6): qty 1

## 2015-03-11 MED ORDER — ENSURE ENLIVE PO LIQD: 237.0000 mL | Freq: Two times a day (BID) | ORAL | Status: DC

## 2015-03-11 MED ORDER — HYDROCORTISONE 2.5 % RE CREA
TOPICAL_CREAM | Freq: Three times a day (TID) | RECTAL | Status: DC
  Administered 2015-03-11 – 2015-03-13 (×4): via RECTAL
  Administered 2015-03-13 – 2015-03-14 (×2): 1 via RECTAL
  Administered 2015-03-14: 11:00:00 via RECTAL
  Administered 2015-03-14: 1 via RECTAL
  Administered 2015-03-15 – 2015-03-16 (×4): via RECTAL
  Filled 2015-03-11: qty 28.35

## 2015-03-11 MED ORDER — ACETAMINOPHEN 650 MG RE SUPP
650.0000 mg | Freq: Four times a day (QID) | RECTAL | Status: DC | PRN
Start: 2015-03-11 — End: 2015-03-16

## 2015-03-11 MED ORDER — POLYETHYLENE GLYCOL 3350 17 G PO PACK
17.0000 g | PACK | Freq: Every day | ORAL | Status: DC
  Administered 2015-03-11: 17 g via ORAL
  Filled 2015-03-11 (×5): qty 1

## 2015-03-11 MED ORDER — INSULIN ASPART 100 UNIT/ML ~~LOC~~ SOLN
0.0000 [IU] | Freq: Three times a day (TID) | SUBCUTANEOUS | Status: DC
  Administered 2015-03-12 (×2): 2 [IU] via SUBCUTANEOUS
  Administered 2015-03-12: 1 [IU] via SUBCUTANEOUS
  Administered 2015-03-13: 3 [IU] via SUBCUTANEOUS
  Administered 2015-03-13: 2 [IU] via SUBCUTANEOUS
  Administered 2015-03-13 – 2015-03-14 (×2): 3 [IU] via SUBCUTANEOUS
  Administered 2015-03-14: 5 [IU] via SUBCUTANEOUS
  Administered 2015-03-14 – 2015-03-15 (×2): 3 [IU] via SUBCUTANEOUS
  Administered 2015-03-15: 5 [IU] via SUBCUTANEOUS
  Administered 2015-03-15 – 2015-03-16 (×3): 3 [IU] via SUBCUTANEOUS

## 2015-03-11 MED ORDER — ACETAMINOPHEN 325 MG PO TABS
650.0000 mg | ORAL_TABLET | Freq: Four times a day (QID) | ORAL | Status: DC | PRN
Start: 2015-03-11 — End: 2015-03-16
  Administered 2015-03-12 – 2015-03-16 (×4): 650 mg via ORAL
  Filled 2015-03-11 (×4): qty 2

## 2015-03-11 MED ORDER — ATORVASTATIN CALCIUM 40 MG PO TABS
40.0000 mg | ORAL_TABLET | Freq: Every day | ORAL | Status: DC
  Administered 2015-03-12 – 2015-03-15 (×4): 40 mg via ORAL
  Filled 2015-03-11 (×5): qty 1

## 2015-03-11 MED ORDER — DEXTROSE 50 % IV SOLN
25.0000 mL | Freq: Once | INTRAVENOUS | Status: AC
  Administered 2015-03-11: 25 mL via INTRAVENOUS
  Filled 2015-03-11: qty 50

## 2015-03-11 MED ORDER — DOCUSATE SODIUM 100 MG PO CAPS
100.0000 mg | ORAL_CAPSULE | Freq: Two times a day (BID) | ORAL | Status: DC
  Administered 2015-03-11 – 2015-03-16 (×8): 100 mg via ORAL
  Filled 2015-03-11 (×10): qty 1

## 2015-03-11 NOTE — Consult Note (Signed)
Reason for Consult:  Hemorrhoids and rectal pain Referring Physician: Kolbe Delmonaco is an 55 y.o. male.   HPI: Pt seen on 03/07/15 with hemorrhoids and treated with Anusol and Miralax.  He returned yesterday with the same complaint, and he had the external hemorrhoid injected with marcaine/epinephrine, and he was sent home again.  He returned to the [facility] clinic today with ongoing pain, dehydration, and unable to stand.  He reports nothing to drink at all yesterday.  He is admitted by the [facility] service and we are ask to see about his hemorrhoids.    Past Medical History  Diagnosis Date  . Hypertension   . Chronic kidney disease   . Hypercholesteremia   . PONV (postoperative nausea and vomiting)   . Peripheral vascular disease (HCC)   . H/O hiatal hernia     hx of years ago   . Pneumonia 10/23/2013  . IDDM (insulin dependent diabetes mellitus) (HCC)   . Depression 08/2012    "just when foot first got cut off"    Past Surgical History  Procedure Laterality Date  . Amputation Right 05/03/2012    Procedure: Right First Ray AMPUTATION;  Surgeon: Eldred Manges, MD;  Location: [facility] OR;  Service: Orthopedics;  Laterality: Right;  . I&d extremity Right 05/29/2012    Procedure: IRRIGATION AND DEBRIDEMENT EXTREMITY- right foot;  Surgeon: Eldred Manges, MD;  Location: [facility] OR;  Service: Orthopedics;  Laterality: Right;  Right Foot Debridement, VAC Application  . Amputation Right 08/16/2012    Procedure: AMPUTATION BELOW KNEE;  Surgeon: Eldred Manges, MD;  Location: [facility] OR;  Service: Orthopedics;  Laterality: Right;  Right Below Knee Amputation  . Amputation Right 10/04/2012    Procedure: AMPUTATION BELOW KNEE REVISION;  Surgeon: Eldred Manges, MD;  Location: [facility] OR;  Service: Orthopedics;  Laterality: Right;  . Insertion of suprapubic catheter N/A 03/02/2013    Procedure: INSERTION OF SUPRAPUBIC CATHETER;  Surgeon: Su Grand, MD;  Location: [facility] ORS;  Service: Urology;  Laterality:  N/A;  . Tonsillectomy  ~ 1967  . Av fistula placement Right 11/01/2013    Procedure: RIGHT ARM ARTERIOVENOUS (AV) FISTULA CREATION;  Surgeon: Chuck Hint, MD;  Location: [facility] OR;  Service: Vascular;  Laterality: Right;  . Abdominal aortagram N/A 05/03/2012    Procedure: ABDOMINAL Ronny Flurry;  Surgeon: Sherren Kerns, MD;  Location: [facility] CATH LAB;  Service: Cardiovascular;  Laterality: N/A;  . Lower extremity angiogram Right 05/03/2012    Procedure: LOWER EXTREMITY ANGIOGRAM;  Surgeon: Sherren Kerns, MD;  Location: [facility] CATH LAB;  Service: Cardiovascular;  Laterality: Right;  rt leg angio    History reviewed. No pertinent family history.  Social History:  reports that he has never smoked. He has never used smokeless tobacco. He reports that he drinks alcohol. He reports that he does not use illicit drugs.  Allergies: No Known Allergies  Prior to Admission medications   Medication Sig Start Date End Date Taking? Authorizing Provider  aspirin 81 MG tablet Take 81 mg by mouth daily.    Yes Historical Provider, MD  atorvastatin (LIPITOR) 40 MG tablet Take 1 tablet (40 mg total) by mouth daily. 03/04/15  Yes Tobey Grim, MD  Brinzolamide-Brimonidine [facility]) 1-0.2 % SUSP Place 1 drop into the left eye 2 (two) times daily.   Yes Historical Provider, MD  calcitRIOL (ROCALTROL) 0.25 MCG capsule Take 1 capsule (0.25 mcg total) by mouth daily. 11/01/13  Yes Katina Degree  Jimmey Ralph, MD  cephALEXin (KEFLEX) 500 MG capsule Take 1 capsule (500 mg total) by mouth 4 (four) times daily. 03/07/15  Yes Shari Upstill, PA-C  EASY TOUCH PEN NEEDLES 31G X 8 MM MISC USE AS DIRECTED TO INJECT INSULIN. 10/09/14  Yes Latrelle Dodrill, MD  furosemide (LASIX) 40 MG tablet Take 1 tablet (40 mg total) by mouth 2 (two) times daily. 03/04/15  Yes Tobey Grim, MD  HYDROcodone-acetaminophen (NORCO/VICODIN) 5-325 MG per tablet Take 1-2 tablets by mouth every 4 (four) hours as needed. Patient taking differently: Take 1-2  tablets by mouth every 4 (four) hours as needed for moderate pain.  10/07/14  Yes Tiffany Neva Seat, PA-C  hydrocortisone (ANUSOL-HC) 2.5 % rectal cream Apply rectally 2 times daily 03/07/15  Yes Samantha Lane Hacker, PA-C  Insulin Glargine (LANTUS SOLOSTAR) 100 UNIT/ML Solostar Pen Inject 20 Units into the skin daily at 10 pm. 03/04/15  Yes Tobey Grim, MD  metoprolol tartrate (LOPRESSOR) 25 MG tablet Take 1 tablet (25 mg total) by mouth 2 (two) times daily. 03/04/15  Yes Tobey Grim, MD  solifenacin (VESICARE) 5 MG tablet Take 5 mg by mouth daily.   Yes Historical Provider, MD  Stann Ore INSULIN SYRINGE 30G X 1/2" 0.3 ML MISC USE ONE SYRINGE AT BEDTIME 11/15/13  Yes [name], MD     Results for orders placed or performed during the hospital encounter of 03/11/15 (from the past 48 hour(s))  I-Stat Chem 8, ED     Status: Abnormal   Collection Time: 03/11/15  2:09 PM  Result Value Ref Range   Sodium 133 (L) 135 - 145 mmol/L   Potassium 3.7 3.5 - 5.1 mmol/L   Chloride 100 (L) 101 - 111 mmol/L   BUN 80 (H) 6 - 20 mg/dL   Creatinine, Ser 1.61 (H) 0.61 - 1.24 mg/dL   Glucose, Bld 48 (L) 65 - 99 mg/dL   Calcium, Ion 0.96 (L) 1.12 - 1.23 mmol/L   TCO2 14 0 - 100 mmol/L   Hemoglobin 11.9 (L) 13.0 - 17.0 g/dL   HCT 04.5 (L) 40.9 - 81.1 %    No results found.  ROS Blood pressure 127/53, pulse 93, temperature 98.4 F (36.9 C), temperature source Oral, resp. rate 21, height  (1.651 m), weight 77.111 kg (170 lb), SpO2 98 %. Physical Exam Gen: Alert, cooperative patient who appears stated age in no acute distress. Vital signs reviewed. Wan appearing. Lying on a stretcher. HEENT: EOMI. Pupils equal reactive to light. Mucous membranes incredibly dry. Oropharynx pink. Neck: No JVD. Supple. Cardiac: Tachycardic with grade 1 murmur. Pulm: Clear to auscultation bilaterally with good air movement. No wheezes or rales noted.  Abd: Soft/nondistended/nontender. Good bowel sounds  throughout all four quadrants. No masses noted.  Exts: Non edematous BL LE, warm and well perfused.  Neuro: Strength is 3 out of 5 bilateral upper extremities. Hand grip strength is 4 out of 5. Unable to assess grip strength as he was lying on stretcher. Sensation is intact throughout. Rectal: Large edematous external hemorrhoid - no sign of thrombosis; mildly tender to palpation; smaller other external hemorrhoid - soft, small fissure   Assessment/Plan: No acute surgical indications  Since the patient cannot really do sitz baths, would recommend q4 hour hot compresses. Also continue anusol and consider lidocaine ointment.    JENNINGS,WILLARD 03/11/2015, 4:04 PM    Wilmon Arms. Corliss Skains, MD, [facility] Surgery  General/ Trauma Surgery  03/11/2015 4:54 PM

## 2015-03-11 NOTE — Assessment & Plan Note (Signed)
Main issue for today. Also weak. He is unable to even sit up in the stretcher today secondary to weakness. He has not been able to hydrate himself at home. This is secondary to both weakness as well as rectal pain. He is currently on stretcher with EMS and therefore we cannot wait for direct admission as we normally do with clinic. Because of this I'm sending him directly to the emergency department. We will have family practice teaching service come down to admit him after he is evaluated in the emergency department. He can have IV hydration as well as pain really started rather than waiting for a bed here in clinic. Also needs to be evaluated by physical therapy secondary to this new weakness.

## 2015-03-11 NOTE — Assessment & Plan Note (Addendum)
Deferred evaluation here in clinic as he was on a stretcher. Sending down to emergency department as above. Recommend surgical evaluation. It sounds like this is thrombosed.

## 2015-03-11 NOTE — Progress Notes (Signed)
Subjective:    Lawrence [name] is a 55 y.o. male who presents to [facility] today for FU for hemorrhoids::  1.  Patient has been having a rough week. He was seen in the emergency department last week and diagnosed UTI. He reports she's been having increasing weakness for roughly the past week as well. Starting around Wednesday last week he began noticing rectal pain. This became sharp and eventually excruciating. Describes this as 10 out of 10 over the weekend and he presented to the emergency department last night. She was diagnosed with hemorrhoids provided Anusol. He has not been able to apply this.  More importantly, he is dehydrated. He has not had anything to eat or drink for the past several days. He said he did have 12 ounces of water today to take his pain medication.  He states it's mostly pain that is keeping him from sitting up to get anything to eat or drink.  From a weakness standpoint, he has been lying in bed since at least last Wednesday. He was brought to clinic today by EMS who corroborates his story. They had to physically lift him up to put him onto the stretcher transport him here. They reported him be tachycardic to the mid 100s when taking his vital signs at home.  He did not have anything at all to drink yesterday. He denies abdominal pain. No nausea or vomiting.  No chest pain, diaphoresis, palpitations, shortness breath. Does endorse weakness as above. Has had some mild cramping in his legs. No lower extremity edema. No fevers or chills.   ROS as above per HPI, otherwise neg.    The following portions of the patient's history were reviewed and updated as appropriate: allergies, current medications, past medical history, family and social history, and problem list. Patient is a nonsmoker.    PMH reviewed.  Past Medical History  Diagnosis Date  . Hypertension   . Chronic kidney disease   . Hypercholesteremia   . PONV (postoperative nausea and vomiting)   . Peripheral vascular  disease (HCC)   . H/O hiatal hernia     hx of years ago   . Pneumonia 10/23/2013  . IDDM (insulin dependent diabetes mellitus) (HCC)   . Depression 08/2012    "just when foot first got cut off"   Past Surgical History  Procedure Laterality Date  . Amputation Right 05/03/2012    Procedure: Right First Ray AMPUTATION;  Surgeon: Eldred Manges, MD;  Location: [facility] OR;  Service: Orthopedics;  Laterality: Right;  . I&d extremity Right 05/29/2012    Procedure: IRRIGATION AND DEBRIDEMENT EXTREMITY- right foot;  Surgeon: Eldred Manges, MD;  Location: [facility] OR;  Service: Orthopedics;  Laterality: Right;  Right Foot Debridement, VAC Application  . Amputation Right 08/16/2012    Procedure: AMPUTATION BELOW KNEE;  Surgeon: Eldred Manges, MD;  Location: [facility] OR;  Service: Orthopedics;  Laterality: Right;  Right Below Knee Amputation  . Amputation Right 10/04/2012    Procedure: AMPUTATION BELOW KNEE REVISION;  Surgeon: Eldred Manges, MD;  Location: [facility] OR;  Service: Orthopedics;  Laterality: Right;  . Insertion of suprapubic catheter N/A 03/02/2013    Procedure: INSERTION OF SUPRAPUBIC CATHETER;  Surgeon: Su Grand, MD;  Location: [facility] ORS;  Service: Urology;  Laterality: N/A;  . Tonsillectomy  ~ 1967  . Av fistula placement Right 11/01/2013    Procedure: RIGHT ARM ARTERIOVENOUS (AV) FISTULA CREATION;  Surgeon: Chuck Hint, MD;  Location: [facility] OR;  Service: Vascular;  Laterality: Right;  . Abdominal aortagram N/A 05/03/2012    Procedure: ABDOMINAL Ronny Flurry;  Surgeon: Sherren Kerns, MD;  Location: [facility] CATH LAB;  Service: Cardiovascular;  Laterality: N/A;  . Lower extremity angiogram Right 05/03/2012    Procedure: LOWER EXTREMITY ANGIOGRAM;  Surgeon: Sherren Kerns, MD;  Location: [facility] CATH LAB;  Service: Cardiovascular;  Laterality: Right;  rt leg angio    Medications reviewed. Current Outpatient Prescriptions  Medication Sig Dispense Refill  . aspirin 81 MG tablet Take 81 mg by mouth daily.     Marland Kitchen atorvastatin  (LIPITOR) 40 MG tablet Take 1 tablet (40 mg total) by mouth daily. 90 tablet 1  . Brinzolamide-Brimonidine (SIMBRINZA) 1-0.2 % SUSP Place 1 drop into the left eye 2 (two) times daily.    . calcitRIOL (ROCALTROL) 0.25 MCG capsule Take 1 capsule (0.25 mcg total) by mouth daily. 30 capsule 0  . cephALEXin (KEFLEX) 500 MG capsule Take 1 capsule (500 mg total) by mouth 4 (four) times daily. 28 capsule 0  . ciprofloxacin (CIPRO) 500 MG tablet Take 1 tablet (500 mg total) by mouth 2 (two) times daily. One po bid x 7 days (Patient not taking: Reported on 03/07/2015) 14 tablet 0  . EASY TOUCH PEN NEEDLES 31G X 8 MM MISC USE AS DIRECTED TO INJECT INSULIN. 100 each 4  . furosemide (LASIX) 40 MG tablet Take 1 tablet (40 mg total) by mouth 2 (two) times daily. 180 tablet 1  . HYDROcodone-acetaminophen (NORCO) 5-325 MG per tablet Take 1 tablet by mouth every 8 (eight) hours. (Patient not taking: Reported on 03/07/2015) 30 tablet 0  . HYDROcodone-acetaminophen (NORCO/VICODIN) 5-325 MG per tablet Take 1-2 tablets by mouth every 4 (four) hours as needed. (Patient taking differently: Take 1-2 tablets by mouth every 4 (four) hours as needed for moderate pain. ) 10 tablet 0  . hydrocortisone (ANUSOL-HC) 2.5 % rectal cream Apply rectally 2 times daily 28.35 g 0  . Insulin Glargine (LANTUS SOLOSTAR) 100 UNIT/ML Solostar Pen Inject 20 Units into the skin daily at 10 pm. 15 mL 11  . metoprolol tartrate (LOPRESSOR) 25 MG tablet Take 1 tablet (25 mg total) by mouth 2 (two) times daily. 180 tablet 1  . oxybutynin (DITROPAN) 5 MG tablet Take 1 tablet (5 mg total) by mouth every 8 (eight) hours as needed for bladder spasms. (Patient not taking: Reported on 03/07/2015) 30 tablet 0  . solifenacin (VESICARE) 5 MG tablet Take 5 mg by mouth daily.    Marland Kitchen ULTICARE INSULIN SYRINGE 30G X 1/2" 0.3 ML MISC USE ONE SYRINGE AT BEDTIME 100 each 0   No current facility-administered medications for this visit.     Objective:   Physical  Exam BP 124/59 mmHg  Pulse 103  Temp(Src) 97.5 F (36.4 C) (Oral)  Ht  (1.651 m)  Wt   SpO2 100% Gen:  Alert, cooperative patient who appears stated age in no acute distress.  Vital signs reviewed.  Wan appearing. Lying on a stretcher. HEENT: EOMI. Pupils equal reactive to light. Mucous membranes incredibly dry. Oropharynx pink. Neck: No JVD. Supple. Cardiac:  Tachycardic with grade 1 murmur. Pulm:  Clear to auscultation bilaterally with good air movement.  No wheezes or rales noted.   Abd:  Soft/nondistended/nontender.  Good bowel sounds throughout all four quadrants.  No masses noted.  Exts: Non edematous BL  LE, warm and well perfused.  Neuro: Strength is 3 out of 5 bilateral upper extremities. Hand grip strength is 4 out of  5. Unable to assess grip strength as he was lying on stretcher.  Sensation is intact throughout. Rectal: Deferred secondary to plan to send him to the emergency department.  Results for orders placed or performed during the hospital encounter of 03/10/15 (from the past 72 hour(s))  POC occult blood, ED Provider will collect     Status: None   Collection Time: 03/10/15  6:11 PM  Result Value Ref Range   Fecal Occult Bld NEGATIVE NEGATIVE

## 2015-03-11 NOTE — Care Management (Signed)
ED CM met with patient regarding follow up appt with [facility] tomorrow 03/10/14 at 11:30 am. He denies any difficulties with obtaining monthly meds or,  transportation to appointments. Marland Kitchen Updated Stevphen Rochester PA-C and  Kim RN on [place] [facility]. No further ED CM needs identified.

## 2015-03-11 NOTE — Patient Instructions (Signed)
I'm sending you straight to the ED for your degree of dehydration and weakness.  They can also get you pain relief for your hemorrhoids.    Our Family Practice Teaching service will come down to admit you at that point.    It was good to see you today.

## 2015-03-11 NOTE — ED Notes (Signed)
Pt here with multiple recent visits for hemorrhoids as recent as last night.  Pt seen at [facility] practice center today and transferred here by [facility] for pain control of hemorrhoids and dehydration.  Pt advises he can't sit up due to pain from hemorrhoids and therefore can't eat or drink.

## 2015-03-11 NOTE — ED Notes (Signed)
Per PTAR - pt seen at [facility] for appointment w/ PCP. Pt dx w/ UTI recently. Pt mildly tachycardic (hr 105), dehydrated per PCP, and weakness. Pt to be direct admit at [facility]. No c/o pain at this time.

## 2015-03-11 NOTE — ED Notes (Signed)
Admitting MD at the bedside.  

## 2015-03-11 NOTE — ED Provider Notes (Signed)
CSN: [id number]     Arrival date & time 03/11/15  1244 History   First MD Initiated Contact with Patient 03/11/15 1304     Chief Complaint  Patient presents with  . Dehydration   HPI   55 year old male sent from family practice for hospital admission. Brief history the patient notes that he was seen in the emergency room last week diagnosed with urinary tract infection. He's had increasing weakness for the past week, Wednesday again having rectal pain. He reports the pain was severe, and presents to the emergency room last night. He was started on Anusol at that time. Patient was seen by his primary care provider today at the family medicine clinic, was found to be dehydrated, has not had significant by mouth intake over the last day. He reports the pain from the hemorrhoids is preventing him from sitting upright, and his cause nausea to keep him from eating or drinking.   Upon my initial evaluation patient was lying flat in the bed, he did appear dehydrated, but appear to be in no acute distress. I asked patient if he needed anything for pain and he denied at this point. Patient reports that he's been using the Anusol and believes this is helping. Patient denies any bleeding from his rectum, fever, chills, nausea or vomiting at this time.   Past Medical History  Diagnosis Date  . Hypertension   . Chronic kidney disease   . Hypercholesteremia   . PONV (postoperative nausea and vomiting)   . Peripheral vascular disease (HCC)   . H/O hiatal hernia     hx of years ago   . Pneumonia 10/23/2013  . IDDM (insulin dependent diabetes mellitus) (HCC)   . Depression 08/2012    "just when foot first got cut off"   Past Surgical History  Procedure Laterality Date  . Amputation Right 05/03/2012    Procedure: Right First Ray AMPUTATION;  Surgeon: Eldred Manges, MD;  Location: [facility] OR;  Service: Orthopedics;  Laterality: Right;  . I&d extremity Right 05/29/2012    Procedure: IRRIGATION AND DEBRIDEMENT  EXTREMITY- right foot;  Surgeon: Eldred Manges, MD;  Location: [facility] OR;  Service: Orthopedics;  Laterality: Right;  Right Foot Debridement, VAC Application  . Amputation Right 08/16/2012    Procedure: AMPUTATION BELOW KNEE;  Surgeon: Eldred Manges, MD;  Location: [facility] OR;  Service: Orthopedics;  Laterality: Right;  Right Below Knee Amputation  . Amputation Right 10/04/2012    Procedure: AMPUTATION BELOW KNEE REVISION;  Surgeon: Eldred Manges, MD;  Location: [facility] OR;  Service: Orthopedics;  Laterality: Right;  . Insertion of suprapubic catheter N/A 03/02/2013    Procedure: INSERTION OF SUPRAPUBIC CATHETER;  Surgeon: Su Grand, MD;  Location: [facility] ORS;  Service: Urology;  Laterality: N/A;  . Tonsillectomy  ~ 1967  . Av fistula placement Right 11/01/2013    Procedure: RIGHT ARM ARTERIOVENOUS (AV) FISTULA CREATION;  Surgeon: Chuck Hint, MD;  Location: [facility] OR;  Service: Vascular;  Laterality: Right;  . Abdominal aortagram N/A 05/03/2012    Procedure: ABDOMINAL Ronny Flurry;  Surgeon: Sherren Kerns, MD;  Location: [facility] CATH LAB;  Service: Cardiovascular;  Laterality: N/A;  . Lower extremity angiogram Right 05/03/2012    Procedure: LOWER EXTREMITY ANGIOGRAM;  Surgeon: Sherren Kerns, MD;  Location: [facility] CATH LAB;  Service: Cardiovascular;  Laterality: Right;  rt leg angio   History reviewed. No pertinent family history. Social History  Substance Use Topics  . Smoking status: Never Smoker   .  Smokeless tobacco: Never Used  . Alcohol Use: Yes     Comment: 10/23/2013 "couple times/month I'll have a beer"    Review of Systems  All other systems reviewed and are negative.     Allergies  Review of patient's allergies indicates no known allergies.  Home Medications   Prior to Admission medications   Medication Sig Start Date End Date Taking? Authorizing Provider  aspirin 81 MG tablet Take 81 mg by mouth daily.    Yes Historical Provider, MD  atorvastatin (LIPITOR) 40 MG tablet Take 1 tablet (40 mg  total) by mouth daily. 03/04/15  Yes Tobey Grim, MD  Brinzolamide-Brimonidine [facility]) 1-0.2 % SUSP Place 1 drop into the left eye 2 (two) times daily.   Yes Historical Provider, MD  calcitRIOL (ROCALTROL) 0.25 MCG capsule Take 1 capsule (0.25 mcg total) by mouth daily. 11/01/13  Yes Ardith Dark, MD  cephALEXin (KEFLEX) 500 MG capsule Take 1 capsule (500 mg total) by mouth 4 (four) times daily. 03/07/15  Yes Shari Upstill, PA-C  EASY TOUCH PEN NEEDLES 31G X 8 MM MISC USE AS DIRECTED TO INJECT INSULIN. 10/09/14  Yes Latrelle Dodrill, MD  furosemide (LASIX) 40 MG tablet Take 1 tablet (40 mg total) by mouth 2 (two) times daily. 03/04/15  Yes Tobey Grim, MD  HYDROcodone-acetaminophen (NORCO/VICODIN) 5-325 MG per tablet Take 1-2 tablets by mouth every 4 (four) hours as needed. Patient taking differently: Take 1-2 tablets by mouth every 4 (four) hours as needed for moderate pain.  10/07/14  Yes Tiffany Neva Seat, PA-C  hydrocortisone (ANUSOL-HC) 2.5 % rectal cream Apply rectally 2 times daily 03/07/15  Yes Samantha Lane Hacker, PA-C  Insulin Glargine (LANTUS SOLOSTAR) 100 UNIT/ML Solostar Pen Inject 20 Units into the skin daily at 10 pm. 03/04/15  Yes Tobey Grim, MD  metoprolol tartrate (LOPRESSOR) 25 MG tablet Take 1 tablet (25 mg total) by mouth 2 (two) times daily. 03/04/15  Yes Tobey Grim, MD  solifenacin (VESICARE) 5 MG tablet Take 5 mg by mouth daily.   Yes Historical Provider, MD  Stann Ore INSULIN SYRINGE 30G X 1/2" 0.3 ML MISC USE ONE SYRINGE AT BEDTIME 11/15/13  Yes [name], MD   BP 127/53 mmHg  Pulse 93  Temp(Src) 98.4 F (36.9 C) (Oral)  Resp 21  Ht  (1.651 m)  Wt 77.111 kg  BMI 28.29 kg/m2  SpO2 98% Physical Exam  Constitutional: He is oriented to person, place, and time. He appears well-developed. No distress.  Dry mucous membranes  HENT:  Head: Normocephalic and atraumatic.  Eyes: Conjunctivae are normal. Pupils are equal, round, and reactive to  light. Right eye exhibits no discharge. Left eye exhibits no discharge. No scleral icterus.  Neck: Normal range of motion. No JVD present. No tracheal deviation present.  Pulmonary/Chest: Effort normal. No stridor.  Abdominal: Soft. There is no tenderness.  Genitourinary:  External hemorrhoid noted, no signs of thrombosis, not significantly tender to palpation. Hemorrhoid non-tense  Neurological: He is alert and oriented to person, place, and time. Coordination normal.  Skin: Skin is warm and dry. No rash noted. He is not diaphoretic. No erythema. No pallor.  Psychiatric: He has a normal mood and affect. His behavior is normal. Judgment and thought content normal.  Nursing note and vitals reviewed.   ED Course  Procedures (including critical care time) Labs Review Labs Reviewed  I-STAT CHEM 8, ED - Abnormal; Notable for the following:    Sodium 133 (*)  Chloride 100 (*)    BUN 80 (*)    Creatinine, Ser 4.50 (*)    Glucose, Bld 48 (*)    Calcium, Ion 1.05 (*)    Hemoglobin 11.9 (*)    HCT 35.0 (*)    All other components within normal limits  CBC WITH DIFFERENTIAL/PLATELET  COMPREHENSIVE METABOLIC PANEL    Imaging Review No results found. I have personally reviewed and evaluated these images and lab results as part of my medical decision-making.   EKG Interpretation None      MDM   Final diagnoses:  Hemorrhoids, unspecified hemorrhoid type  Dehydration    Labs: I-STAT Chem-8  Imaging:  Consults:  Therapeutics:  Discharge Meds:   Assessment/Plan: Patient presents for admission to the ED at the request of family medicine. Patient was seen by them today and was directed to the emergency room for evaluation and hospital admission. They were not able to wait for direct admission in the clinic today. Recommendation for IV hydration and pain management. Patient does appear dehydrated he starting on normal saline 125 per hour, patient does not need pain medication at  this time, he was informed that family medicine service would be done to evaluate for admission. Chem-8 ordered here. No need for surgical consult this time. Attending physician Tilden Fossa MD spoke with family medicine service.          Eyvonne Mechanic, PA-C 03/11/15 1542  Tilden Fossa, MD 03/12/15 1230

## 2015-03-11 NOTE — H&P (Signed)
Family Medicine Teaching [facility] Admission History and Physical Service Pager: [phone]  Patient name: Lawrence [name] Medical record number: [id number] Date of birth: [birth year] Age: 55 y.o. Gender: male  Primary Care Provider: [name], MD Consultants: gen surgery Code Status: FULL (discussed on admission)  Chief Complaint: dehydration, weakness  Assessment and Plan: Lawrence [name] is a 55 y.o. male presenting with dehydration, weakness . 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 3.39 on 03/07/15 (baseline around 2.6-2.9). likely 2/2 dehydration in the setting of pain from possibly thrombosed hemorrhoids. Normotensive but with dry mucus membrane. Mildly tachycardic. Also with generalized weakness.  - admit to FPTS under Dr Deirdre Priest - Med surg - VS per floor protocol - BMP now, Repeat BMP in am - Hold lasix in the setting of AKI and dehydration. 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 UA with trace LE and rare bacteria on 03/07/2015. Started on Keflex that he reports taking. Skin around the catheter without sign of infection.  - continued his Keflex here although his UA is not convincing - Consider getting another UA and culture if febrile - Foley care  External Hemorrhoids: about 8 mm by 5 mm in dimension posterior to anal opening. No active bleeding. Doesn't look infected. Concern for thrombosis. Pain 9/10 when he moves. He tried Anusol cream that helped a little bit but reports that he is still unable to sit up to take PO. - continue Anusol cream - Can give home Norco for moderate to severe pain - General surgery consulted. No surgical intervention for now. Hot compresses q4 hours, topical pain cream.  Sits baths when able.  Hypocalcemia:  total Ca 9.2 (normal) three days ago. ionized Ca 1.05 (low). Has history of SHPTH per his chart.  -follow up on his Ca -Check phos -Consider checking Vit D-2-OH level  DM2: on Lantus 20 u qhs. A1c 7.8 in 10/2014. - Hold insulin if NPO for surgical intervention.  - Sens SSI - CBG qac and HS  HTN: BP 127/56 in ED. - Continue home Lopressor - Hold lasix in setting of AKI  Diastolic HF: Grade 2 diastolic on echo 02/4780. EF 50% though inadequate evaluation - Continue home BB.  Hold lasix - Repeat echo outpatient as clinically indicated  FEN/GI:  -IVFs -Sips with meds  Prophylaxis: sub-q heparin given AoCKD  Disposition: Admit to FPTS for rehydration and c/s gen surgery for hemorrhoid evaluation  History of Present Illness:  Lawrence [name] is a 55 y.o. male presenting with dehydration, aki  Patient seen in clinic today by PCP.  He was sent to ED for admission, as he was dehydrated and unable to stay hydrated in the setting of hemorrhoid pain. He reports that rectal pain began last Wednesday.  He came to the ED and was given anusol, which he reports doesn't help.  He reports that he cannot sit up to eat/drink because he cannot sit up 2/2 pain.  He denies rectal bleeding.  He resides with a mentally challenged uncle.  Has a chronic suprapubic catheter.  Recent UTI, currently on keflex QID.  Had a fever to 100F at home before starting Keflex.   No abd pain, back pain, nausea, vomiting, diarrhea, rasjhes.  Had a BM last week, once weekly BM is normal for him.  Has a  h/o constipation.  Uses a stool softener.  Pain is 0/10 lying and a 9/10 with movement and sitting up.  Does not smoke, drink or use drugs.  ED course: BMP significant for Na 133, Cr 4.5 (from 3.4 three days ago), Glucose of 48, low ionized Ca to 1.05 &  Hgb 12. Received 50% dextrose for hypoglycemia, 1L NS bolus for rehydration.  Review Of Systems: Per HPI with the following additions: none Otherwise the remainder of the systems  were negative.  Patient Active Problem List   Diagnosis Date Noted  . Dehydration 03/11/2015  . Hemorrhoids 03/11/2015  . Acute renal failure superimposed on stage 4 chronic kidney disease (HCC) 03/11/2015  . Hemorrhoids, external, thrombosed 03/11/2015  . Physical deconditioning 03/11/2015  . HTN (hypertension) 08/09/2014  . Suprapubic catheter (HCC) 08/09/2014  . Bladder spasms 08/09/2014  . Redness of eye, right 01/15/2014  . Secondary hyperparathyroidism (HCC) 11/28/2013  . Diastolic heart failure (HCC) 11/12/2013  . Normocytic anemia 10/10/2013  . Tinnitus of both ears 10/09/2013  . Surgical wound dehiscence 10/04/2012  . Dehiscence of amputation stump (HCC) 10/04/2012  . Unilateral complete BKA--right 08/22/2012  . Osteomyelitis of right foot (HCC) 08/17/2012    Class: Diagnosis of  . Chronic cough 02/22/2012  . Hematuria 03/23/2011  . Prostate asymmetry 03/23/2011  . Neuropathy (HCC) 08/25/2010  . Chronic kidney disease (CKD), stage 4 03/14/2009  . DM (diabetes mellitus), type 2, uncontrolled (HCC) 12/26/2007  . HYPERCHOLESTEROLEMIA 12/26/2007    Past Medical History: Past Medical History  Diagnosis Date  . Hypertension   . Chronic kidney disease   . Hypercholesteremia   . PONV (postoperative nausea and vomiting)   . Peripheral vascular disease (HCC)   . H/O hiatal hernia     hx of years ago   . Pneumonia 10/23/2013  . IDDM (insulin dependent diabetes mellitus) (HCC)   . Depression 08/2012    "just when foot first got cut off"    Past Surgical History: Past Surgical History  Procedure Laterality Date  . Amputation Right 05/03/2012    Procedure: Right [place] AMPUTATION;  Surgeon: Eldred Manges, MD;  Location: [facility] OR;  Service: Orthopedics;  Laterality: Right;  . I&d extremity Right 05/29/2012    Procedure: IRRIGATION AND DEBRIDEMENT EXTREMITY- right foot;  Surgeon: Eldred Manges, MD;  Location: [facility] OR;  Service: Orthopedics;  Laterality: Right;  Right Foot  Debridement, VAC Application  . Amputation Right 08/16/2012    Procedure: AMPUTATION BELOW KNEE;  Surgeon: Eldred Manges, MD;  Location[facility] OR;  Service: Orthopedics;  Laterality: Right;  Right Below Knee Amputation  . Amputation Right 10/04/2012    Procedure: AMPUTATION BELOW KNEE REVISION;  Surgeon: Eldred Manges, MD;  Location: [facility] OR;  Service: Orthopedics;  Laterality: Right;  . Insertion of suprapubic catheter N/A 03/02/2013    Procedure: INSERTION OF SUPRAPUBIC CATHETER;  Surgeon: Su Grand, MD;  Location: [facility] ORS;  Service: Urology;  Laterality: N/A;  . Tonsillectomy  ~ 1967  . Av fistula placement Right 11/01/2013    Procedure: RIGHT ARM ARTERIOVENOUS (AV) FISTULA CREATION;  Surgeon: Chuck Hint, MD;  Location: [facility] OR;  Service: Vascular;  Laterality: Right;  . Abdominal aortagram N/A 05/03/2012    Procedure: ABDOMINAL Ronny Flurry;  Surgeon: Sherren Kerns, MD;  Location: [facility] CATH LAB;  Service: Cardiovascular;  Laterality: N/A;  . Lower extremity angiogram Right 05/03/2012    Procedure: LOWER EXTREMITY ANGIOGRAM;  Surgeon: Sherren Kerns, MD;  Location: [facility] CATH  LAB;  Service: Cardiovascular;  Laterality: Right;  rt leg angio    Social History: Social History  Substance Use Topics  . Smoking status: Never Smoker   . Smokeless tobacco: Never Used  . Alcohol Use: Yes     Comment: 10/23/2013 "couple times/month I'll have a beer"    Family History: History reviewed. No pertinent family history.  Allergies and Medications: No Known Allergies No current facility-administered medications on file prior to encounter.   Current Outpatient Prescriptions on File Prior to Encounter  Medication Sig Dispense Refill  . aspirin 81 MG tablet Take 81 mg by mouth daily.     Marland Kitchen atorvastatin (LIPITOR) 40 MG tablet Take 1 tablet (40 mg total) by mouth daily. 90 tablet 1  . Brinzolamide-Brimonidine (SIMBRINZA) 1-0.2 % SUSP Place 1 drop into the left eye 2 (two) times daily.    . calcitRIOL  (ROCALTROL) 0.25 MCG capsule Take 1 capsule (0.25 mcg total) by mouth daily. 30 capsule 0  . cephALEXin (KEFLEX) 500 MG capsule Take 1 capsule (500 mg total) by mouth 4 (four) times daily. 28 capsule 0  . EASY TOUCH PEN NEEDLES 31G X 8 MM MISC USE AS DIRECTED TO INJECT INSULIN. 100 each 4  . furosemide (LASIX) 40 MG tablet Take 1 tablet (40 mg total) by mouth 2 (two) times daily. 180 tablet 1  . HYDROcodone-acetaminophen (NORCO/VICODIN) 5-325 MG per tablet Take 1-2 tablets by mouth every 4 (four) hours as needed. (Patient taking differently: Take 1-2 tablets by mouth every 4 (four) hours as needed for moderate pain. ) 10 tablet 0  . hydrocortisone (ANUSOL-HC) 2.5 % rectal cream Apply rectally 2 times daily 28.35 g 0  . Insulin Glargine (LANTUS SOLOSTAR) 100 UNIT/ML Solostar Pen Inject 20 Units into the skin daily at 10 pm. 15 mL 11  . metoprolol tartrate (LOPRESSOR) 25 MG tablet Take 1 tablet (25 mg total) by mouth 2 (two) times daily. 180 tablet 1  . solifenacin (VESICARE) 5 MG tablet Take 5 mg by mouth daily.    Marland Kitchen ULTICARE INSULIN SYRINGE 30G X 1/2" 0.3 ML MISC USE ONE SYRINGE AT BEDTIME 100 each 0    Objective: BP 127/53 mmHg  Pulse 93  Temp(Src) 98.4 F (36.9 C) (Oral)  Resp 21  Ht  (1.651 m)  Wt 170 lb (77.111 kg)  BMI 28.29 kg/m2  SpO2 98% Exam: Gen: lying in bed Oropharynx: dry, lips parched CV: regular rate and rythm. S1 & S2 audible, no murmurs. Resp: no apparent work of breathing GI: abdomen appears big and full to be able to palpate liver and spleen, dull to percussion on the right, bowel sounds normal, no tenderness, no rebound or guarding. GU: no suprapubic tenderness. Suprapubic catheter in place. Surrounding skin without sign of infection. No discharge or leak around the catheter. Skin: no lesion MSK: BKA on the right. Dry point wound on the tip of his big toe Neuro: awake, alert & oriented. No gross deficit.  Labs and Imaging: CBC BMET   Recent Labs Lab  03/07/15 1852 03/11/15 1409  WBC 14.1*  --   HGB 10.6* 11.9*  HCT 32.0* 35.0*  PLT 144*  --     Recent Labs Lab 03/07/15 1852 03/11/15 1409  NA 136 133*  K 3.9 3.7  CL 101 100*  CO2 21*  --   BUN 74* 80*  CREATININE 3.39* 4.50*  GLUCOSE 200* 48*  CALCIUM 9.2  --      No results found.  Alwyn Ren  Ludwig Lean, MD 03/11/2015, 5:35 PM PGY-1,  Family Medicine FPTS Intern pager: [phone], text pages welcome  I have separately seen and examined the patient. I have discussed the findings and exam with Dr Alanda Slim and agree with the above note.  My changes/additions are outlined in BLUE.   Jarmel Linhardt M. Nadine Counts, DO PGY-2, [facility] Family Medicine

## 2015-03-11 NOTE — Care Management Note (Addendum)
Case Management Note  Patient Details  Name: Lawrence [name] MRN: [id number] Date of Birth: 08-Jul-1960  Subjective/Objective:     Patient sent to [facility] ED from PCP office with low blood sugar. 3 ED visits in the past 6 months  Patient has been diagnosed with  hemorrhoids and have been having rectal pain for the past week.  Patient reports he has not been eating much to avoid bowel movemnents due to pain.  Patient is diabetic CBG in PCP was 52           Action/Plan: CM met with patient at bedside. Patient is known to CM. Discussed discharge planning with patient, discussed [place] services and how he could benefit from the services. Patient is agreeable with plan. Offered choice of a [place], selected [facility]. Patient has a Rt BKA with prosthesis ambulates with a cane. Also discussed [place], he is also agreeable. Referral was placed to [facility]. CM will continue follow for discharge plan.  Expected Discharge Date:                  Expected Discharge Plan:  [place]  In-House Referral:     Discharge planning Services  CM Consult  Post Acute Care Choice:    Choice offered to:  Patient  DME Arranged:    DME Agency:     HH Arranged:  PT, OT, RN [place] Agency:  [place]  Status of Service:  In process, will continue to follow  Medicare Important Message Given:    Date Medicare IM Given:    Medicare IM give by:    Date Additional Medicare IM Given:    Additional Medicare Important Message give by:     If discussed at [place] of Stay Meetings, dates discussed:    Additional CommentsLaurena Slimmer, RN 03/11/2015, 7:01 PM

## 2015-03-11 NOTE — ED Notes (Signed)
Pt's CBG 52, pt encouraged to drink orange juice and refuses states he doesn't want to sit up to drink or eat at this time, ED notified and order for half of D50 IV gotten.

## 2015-03-11 NOTE — Assessment & Plan Note (Signed)
Acute kidney injury. Needs to have recheck of creatinine. He does not have much room to work with this he is already chronic kidney disease stage IV

## 2015-03-12 ENCOUNTER — Other Ambulatory Visit: Payer: Self-pay

## 2015-03-12 DIAGNOSIS — E43 Unspecified severe protein-calorie malnutrition: Secondary | ICD-10-CM | POA: Insufficient documentation

## 2015-03-12 LAB — CBC
HCT: 27.5 % — ABNORMAL LOW (ref 39.0–52.0)
HEMOGLOBIN: 9.2 g/dL — AB (ref 13.0–17.0)
MCH: 27.7 pg (ref 26.0–34.0)
MCHC: 33.5 g/dL (ref 30.0–36.0)
MCV: 82.8 fL (ref 78.0–100.0)
PLATELETS: 214 10*3/uL (ref 150–400)
RBC: 3.32 MIL/uL — ABNORMAL LOW (ref 4.22–5.81)
RDW: 15.7 % — AB (ref 11.5–15.5)
WBC: 13.3 10*3/uL — ABNORMAL HIGH (ref 4.0–10.5)

## 2015-03-12 LAB — BASIC METABOLIC PANEL
Anion gap: 18 — ABNORMAL HIGH (ref 5–15)
BUN: 88 mg/dL — AB (ref 6–20)
CALCIUM: 7.8 mg/dL — AB (ref 8.9–10.3)
CHLORIDE: 101 mmol/L (ref 101–111)
CO2: 14 mmol/L — ABNORMAL LOW (ref 22–32)
CREATININE: 4.78 mg/dL — AB (ref 0.61–1.24)
GFR, EST AFRICAN AMERICAN: 15 mL/min — AB (ref >60)
GFR, EST NON AFRICAN AMERICAN: 13 mL/min — AB (ref >60)
Glucose, Bld: 182 mg/dL — ABNORMAL HIGH (ref 65–99)
Potassium: 3.5 mmol/L (ref 3.5–5.1)
SODIUM: 133 mmol/L — AB (ref 135–145)

## 2015-03-12 LAB — GLUCOSE, CAPILLARY
GLUCOSE-CAPILLARY: 187 mg/dL — AB (ref 65–99)
GLUCOSE-CAPILLARY: 194 mg/dL — AB (ref 65–99)
GLUCOSE-CAPILLARY: 129 mg/dL — AB (ref 65–99)
Glucose-Capillary: 144 mg/dL — ABNORMAL HIGH (ref 65–99)
Glucose-Capillary: 170 mg/dL — ABNORMAL HIGH (ref 65–99)

## 2015-03-12 LAB — CBC AND DIFFERENTIAL: WBC: 13.3 10^3/mL

## 2015-03-12 MED ORDER — GABAPENTIN 600 MG PO TABS: 600.0000 mg | ORAL_TABLET | Freq: Three times a day (TID) | ORAL | Status: DC

## 2015-03-12 MED ORDER — CEPHALEXIN 500 MG PO CAPS
500.0000 mg | ORAL_CAPSULE | Freq: Three times a day (TID) | ORAL | Status: DC
  Administered 2015-03-12: 500 mg via ORAL
  Filled 2015-03-12: qty 1

## 2015-03-12 MED ORDER — STERILE WATER FOR INJECTION IV SOLN
INTRAVENOUS | Status: DC
  Administered 2015-03-12 (×2): via INTRAVENOUS
  Filled 2015-03-12 (×3): qty 850

## 2015-03-12 MED ORDER — HYDRALAZINE HCL 20 MG/ML IJ SOLN: 5.0000 mg | Freq: Four times a day (QID) | INTRAMUSCULAR | Status: DC | PRN

## 2015-03-12 MED ORDER — GABAPENTIN 600 MG PO TABS
300.0000 mg | ORAL_TABLET | Freq: Three times a day (TID) | ORAL | Status: DC
  Administered 2015-03-12 – 2015-03-13 (×3): 300 mg via ORAL
  Filled 2015-03-12 (×3): qty 1

## 2015-03-12 MED ORDER — AMLODIPINE BESYLATE 5 MG PO TABS
5.0000 mg | ORAL_TABLET | Freq: Every day | ORAL | Status: DC
  Administered 2015-03-12 – 2015-03-16 (×4): 5 mg via ORAL
  Filled 2015-03-12 (×5): qty 1

## 2015-03-12 MED ORDER — DEXTROSE 50 % IV SOLN
INTRAVENOUS | Status: AC
  Filled 2015-03-12: qty 50

## 2015-03-12 MED ORDER — PRO-STAT SUGAR FREE PO LIQD
30.0000 mL | Freq: Three times a day (TID) | ORAL | Status: DC
  Administered 2015-03-12 – 2015-03-15 (×6): 30 mL via ORAL
  Filled 2015-03-12 (×9): qty 30

## 2015-03-12 MED ORDER — HYDROCODONE-ACETAMINOPHEN 5-325 MG PO TABS
1.0000 | ORAL_TABLET | Freq: Four times a day (QID) | ORAL | Status: DC | PRN
  Administered 2015-03-12: 1 via ORAL
  Filled 2015-03-12: qty 1

## 2015-03-12 NOTE — Consult Note (Signed)
WOC wound consult note Reason for Consult:intertriginous dermatitis with partial thickness tissue loss at the apex of the gluteal cleft. Wound type: Moisture with friction Pressure Ulcer POA: No Measurement:3cm x 0.1cm x 0.2cm Wound FAO:ZHYQ, moist Drainage (amount, consistency, odor) scant serous Periwound:intact, dry Dressing procedure/placement/frequency: This area should reepithelialize with a continually moist environment and pressure redistribution.  Orders are provided for Nursing that will pad and protect (silicone foam).  Patient to turn from side to side while in bed, avoiding the supine position except for meals and use a pressure redistribution chair cushion when OOB in chair. Patient expresses appreciation for visit and suggestions and is in agreement with the POC. WOC nursing team will not follow, but will remain available to this patient, the nursing and medical teams.  Please re-consult if needed. Thanks, Ladona Mow, MSN, RN, GNP, [place]  Pager# [phone]

## 2015-03-12 NOTE — Progress Notes (Signed)
Initial Nutrition Assessment  DOCUMENTATION CODES:   Severe malnutrition in context of acute illness/injury  INTERVENTION:  -Provide Prostat TID, 100 kcal, 15 g of Protein -d/c Ensure Enlive -Encourage PO intake  NUTRITION DIAGNOSIS:   Malnutrition related to poor appetite as evidenced by meal completion < 50%, severe depletion of muscle mass, moderate depletion of body fat.  GOAL:   Patient will meet greater than or equal to 90% of their needs  MONITOR:   PO intake, Supplement acceptance, Labs, Weight trends, I & O's  REASON FOR ASSESSMENT:   Malnutrition Screening Tool    ASSESSMENT:   55 year old male sent from family practice for hospital admission. Brief history the patient notes that he was seen in the emergency room last week diagnosed with urinary tract infection. He's had increasing weakness for the past week, Wednesday again having rectal pain. He reports the pain was severe, and presents to the emergency room last night. He was started on Anusol at that time. Patient was seen by his primary care provider today at the family medicine clinic, was found to be dehydrated, has not had significant by mouth intake over the last day. He reports the pain from the hemorrhoids is preventing him from sitting upright, and his cause nausea to keep him from eating or drinking.   Pt seen for MST. Pt states that his appetite is "no good". He is consuming 25%-50% of his meals. Pt states for 5 days PTA he did not eat due to pain level. Pt does not like Ensure or Boost, will order Prostat to help with poor PO.   Pt states he has no weight loss recently. Conducted a nutrition focused physical exam. Found signs of muscle and fat wasting. Pt severely malnourished in the context of acute illness.    Medications reviewed. Labs reviewed: CBG 52-72  Diet Order:  Diet heart healthy/carb modified Room service appropriate?: Yes; Fluid consistency:: Thin  Skin:  Wound (see comment) (Sacrum  wound)  Last BM:  unknown  Height:   Ht Readings from Last 1 Encounters:  03/11/15  (1.651 m)    Weight:   Wt Readings from Last 1 Encounters:  03/11/15 175 lb 14.4 oz (79.788 kg)    Ideal Body Weight:  57.7 kg (kg)  BMI:  Body mass index is 29.27 kg/(m^2).  Estimated Nutritional Needs:   Kcal:  2100-2300  Protein:  110-120  Fluid:  >/= 2L  EDUCATION NEEDS:   No education needs identified at this time  Brynda Rim, Dietetic Intern Pager: [phone]

## 2015-03-12 NOTE — Progress Notes (Signed)
Utilization review completed. Annielee Jemmott, RN, BSN. 

## 2015-03-12 NOTE — Progress Notes (Addendum)
Family Medicine Teaching Service Daily Progress Note Intern Pager: [phone]  Patient name: Lawrence [name] Medical record number: [id number] Date of birth: 1960/06/19 Age: 55 y.o. Gender: male  Primary Care Provider: [name], MD Consultants: Surgery  Code Status: Full   Pt Overview and Major Events to Date:    Assessment and Plan: Lawrence [name] is a 55 y.o. male presenting with dehydration, weakness . PMH is significant for DM2, HTN, hemorrhoids, CKD 4, Diastolic HF, secondary hyperparathyroidism, suprapubic catheter for urinary retention and spastic bladder, R BKA  Acute on Chronic renal failure, stage 4: Cr 4.5.> 4.78 (baseline around 2.6-2.9) likely 2/2 dehydration in the setting of pain from possibly thrombosed hemorrhoids. Mildly tachycardic - Continue BMET AM  - MIVF  ml/per  - Will consider nephrology consult, after discussion with PCP.  Left Hand Pain and Numbness. Unlikely steal syndrome as AV fistula is not new in its establishment. Likely due to neuropathy  - Will start gabapentin 300 mg TID for hand pain   External Hemorrhoids: about 8 mm by 5 mm in dimension posterior to anal opening. No active bleeding. No concern for thrombosis per surgery.  - Continue Anusol cream and Proctofoam  - General surgery consulted. No surgical intervention for now. Hot compresses q4 hours, topical pain cream.Sits baths when able. - Donut cushion  - PT/OT consult recommend SNF, will discuss with patient   DM2: on Lantus 20 u qhs. A1c 7.8 in 10/2014. CBG 182 - Sens SSI - CBG qac and HS  HTN: BP 163/57 - Continue home Lopressor - Norvasc 5 mg to help control HTN  - Hydralazine for bp greater than 180 or 110   Diastolic HF: Grade 2 diastolic on echo 02/3084. EF 50% though inadequate evaluation - Continue home BB. Hold lasix  - Repeat echo outpatient as clinically indicated  FEN/GI:  -IVFs - Heart healthy diet.    Prophylaxis: sub-q heparin given AoCKD  Disposition: home  once able to sit up and take fluids   Subjective:   Patient still having positional hemorrhoid pain. Chronic chest pain. Numbness and pain in right arm due to AV fistula site per patient.   Objective: Temp:  [97.5 F (36.4 C)-98.4 F (36.9 C)] 97.7 F (36.5 C) (02/01 0700) Pulse Rate:  [88-106] 101 (02/01 0700) Resp:  [13-27] 20 (02/01 0700) BP: (124-182)/(53-71) 163/57 mmHg (02/01 0700) SpO2:  [96 %-100 %] 99 % (02/01 0700) Weight:  [170 lb (77.111 kg)-175 lb 14.4 oz (79.788 kg)] 175 lb 14.4 oz (79.788 kg) (01/31 1943) Physical Exam: General: Alert and oriented 3  Cardiovascular: Regular rate rhythm no murmurs, gallops, rubs Respiratory: CTAB, no wheezing  Abdomen:  BS+, protuberant abdomen, no ttp Extremities: BKA of right leg, no lower extremity edema in left leg  Laboratory:  Recent Labs Lab 03/11/15 1400 03/11/15 1409 03/11/15 2126 03/12/15 0502  WBC 17.1*  --  15.1* 13.3*  HGB 9.8* 11.9* 9.6* 9.2*  HCT 30.0* 35.0* 29.5* 27.5*  PLT 214  --  218 214    Recent Labs Lab 03/07/15 1852 03/11/15 1400 03/11/15 1409 03/11/15 2126 03/12/15 0502  NA 136 135 133*  --  133*  K 3.9 3.9 3.7  --  3.5  CL 101 97* 100*  --  101  CO2 21* 13*  --   --  14*  BUN 74* 91* 80*  --  88*  CREATININE 3.39* 4.87* 4.50* 4.87* 4.78*  CALCIUM 9.2 8.6*  --   --  7.8*  PROT  --  6.8  --   --   --   BILITOT  --  1.1  --   --   --   ALKPHOS  --  164*  --   --   --   ALT  --  19  --   --   --   AST  --  28  --   --   --   GLUCOSE 200* 36* 48*  --  182*    Imaging/Diagnostic Tests: No results found.   Lawrence Mcquitty Mayra Reel, MD 03/12/2015, 9:34 AM PGY-1, [facility] Family Medicine FPTS Intern pager: [phone], text pages welcome

## 2015-03-12 NOTE — Evaluation (Signed)
Physical Therapy Evaluation Patient Details Name: Lawrence [name] MRN: [id number] DOB: [birth year] Today's Date: 03/12/2015   History of Present Illness  Lawrence [name] is a 55 y.o. male presenting with dehydration, weakness . PMH is significant for DM2, HTN, hemorrhoids, CKD 4, Diastolic HF, secondary hyperparathyroidism, suprapubic catheter for urinary retention and spastic bladder, R BKA  Clinical Impression  Pt with the above history present with the deficits outlined below. Per pt he is much weaker than before. PTA pt independently transferred into w/c for all mobility. Today pt is limited with all mobility and transfers due to pain. Required total A to don prosthesis and Mod-Max A for bed mobility before standing at EOB. With 2+ A pt able to maintain standing and step up at EOB to reposition. Pt will benefit from continued skilled PT and D/C to ST-SNF for continued rehab to achieve Mod I level of function for safe transition home. Aware of pt resisting recommendation of SNF, despite education on needing 24/7 assist if returning home which his uncle is unable to provide.     Follow Up Recommendations SNF;Supervision/Assistance - 24 hour    Equipment Recommendations  None recommended by PT;Other (comment) (possibly RW pt unsure if there is one at home)    Recommendations for Other Services       Precautions / Restrictions Precautions Precautions: Fall Required Braces or Orthoses: Other Brace/Splint Other Brace/Splint: R prosthetic Restrictions Weight Bearing Restrictions: No      Mobility  Bed Mobility Overal bed mobility: Needs Assistance Bed Mobility: Rolling;Sidelying to Sit;Sit to Supine Rolling: Mod assist Sidelying to sit: Mod assist;HOB elevated   Sit to supine: Min assist   General bed mobility comments: pt requiring max A for first attempt to sit EOB, returned to supine to don RLE, 2nd attempt Mod A to rolling and initiate trunk righting  Transfers Overall transfer  level: Needs assistance Equipment used: Rolling walker (2 wheeled) Transfers: Sit to/from Stand;Lateral/Scoot Transfers Sit to Stand: Mod assist;+2 physical assistance;From elevated surface         General transfer comment: Mod A x 2 with bed elevated to stand with mulptiple VC's and/hand palcement fro safe transfer, stood ~10 seconds before taking 3 small sidesteps up in bed  Ambulation/Gait             General Gait Details: limited to 3 steps HOB, Min A walker management and V/C's for sequencing  Stairs            Wheelchair Mobility    Modified Rankin (Stroke Patients Only)       Balance Overall balance assessment: Needs assistance Sitting-balance support: Bilateral upper extremity supported;Feet unsupported Sitting balance-Leahy Scale: Fair Sitting balance - Comments: upon sitting pt sayd "I can't do this Im going to fall", unable to don prosthetic in sitting   Standing balance support: Bilateral upper extremity supported Standing balance-Leahy Scale: Poor Standing balance comment: BUE supported on RW, c/o pain in R hand                             Pertinent Vitals/Pain Pain Assessment: Faces Faces Pain Scale: Hurts even more Pain Location: external hemrroids Pain Descriptors / Indicators: Constant Pain Intervention(s): Monitored during session;Limited activity within patient's tolerance    Home Living Family/patient expects to be discharged to:: Private residence Living Arrangements: Other relatives (lives with uncle-mentally disabled) Available Help at Discharge: Family;Available PRN/intermittently Type of Home: House Home Access: Ramped entrance  Home Layout: One level Home Equipment: Cane - quad;Wheelchair - manual      Prior Function Level of Independence: Independent with assistive device(s)         Comments: Transfers with lateral scoot independently into w/c and uses [employer] bus to navigate the community. LIves with uncle  who is metally disabled but he assists with the cooking     Hand Dominance   Dominant Hand: Left    Extremity/Trunk Assessment   Upper Extremity Assessment: Generalized weakness (right limitied function due to pain in hand)           Lower Extremity Assessment: Generalized weakness;RLE deficits/detail RLE Deficits / Details: R BKA       Communication   Communication: No difficulties  Cognition Arousal/Alertness: Awake/alert Behavior During Therapy: WFL for tasks assessed/performed Overall Cognitive Status: Impaired/Different from baseline Area of Impairment: Safety/judgement         Safety/Judgement: Decreased awareness of safety;Decreased awareness of deficits          General Comments General comments (skin integrity, edema, etc.): required total A to don RLE    Exercises        Assessment/Plan    PT Assessment Patient needs continued PT services  PT Diagnosis Generalized weakness;Acute pain;Difficulty walking   PT Problem List Decreased strength;Decreased activity tolerance;Decreased balance;Decreased mobility;Decreased knowledge of use of DME;Pain  PT Treatment Interventions DME instruction;Gait training;Functional mobility training;Therapeutic activities;Therapeutic exercise;Balance training;Wheelchair mobility training   PT Goals (Current goals can be found in the Care Plan section) Acute Rehab PT Goals Patient Stated Goal: Go home PT Goal Formulation: With patient Time For Goal Achievement: 03/25/15 Potential to Achieve Goals: Fair    Frequency Min 3X/week   Barriers to discharge Decreased caregiver support pt lives with uncle who is MR , can not help with physical support but able to complete hometasks and cooking    Co-evaluation               End of Session Equipment Utilized During Treatment: Gait belt Activity Tolerance: Patient limited by pain Patient left: in bed;with bed alarm set;with call bell/phone within reach Nurse  Communication: Mobility status         Time: 1914-7829 PT Time Calculation (min) (ACUTE ONLY): 25 min   Charges:   PT Evaluation $PT Eval Moderate Complexity: 1 Procedure PT Treatments $Therapeutic Activity: 8-22 mins   PT G Codes:        Ulyses Jarred Mar 13, 2015, 10:10 AM  Ulyses Jarred, Student Physical Therapist Acute Rehab [phone]

## 2015-03-12 NOTE — Progress Notes (Signed)
Patient ID: Lawrence [name], male   DOB: [birth year], 55 y.o.   MRN: [id number]     [place]      [address] [place], [place] [address]    Phone: [phone] FAX: [phone]     Subjective: Still sore. Warm compress not yet initiated.   Objective:  Vital signs:  Filed Vitals:   03/11/15 1915 03/11/15 1943 03/12/15 0507 03/12/15 0700  BP: 132/61 182/67 171/66 163/57  Pulse: 88 103 106 101  Temp:  98.2 F (36.8 C) 98.1 F (36.7 C) 97.7 F (36.5 C)  TempSrc:  Oral Oral Oral  Resp: 14 16 18 20   Height:  5' 5"  (1.651 m)    Weight:  79.788 kg (175 lb 14.4 oz)    SpO2: 97% 100% 99% 99%       Intake/Output   Yesterday:  01/31 0701 - 02/01 0700 In: 0  Out: 650 [Urine:650] This shift:    I/O last 3 completed shifts: In: 0  Out: 650 [Urine:650]    Physical Exam: General: Pt awake/alert/oriented x4 in no acute distress Anorectal-large external hemorrhoids, non thrombosed, but tender and edematous.   Problem List:   Principal Problem:   Dehydration Active Problems:   DM (diabetes mellitus), type 2, uncontrolled (HCC)   HTN (hypertension)   Bladder spasms   Hemorrhoids   Acute renal failure superimposed on stage 4 chronic kidney disease (HCC)   Hemorrhoids, external, thrombosed   Physical deconditioning    Results:   Labs: Results for orders placed or performed during the hospital encounter of 03/11/15 (from the past 48 hour(s))  CBC with Differential     Status: Abnormal   Collection Time: 03/11/15  2:00 PM  Result Value Ref Range   WBC 17.1 (H) 4.0 - 10.5 K/uL   RBC 3.62 (L) 4.22 - 5.81 MIL/uL   Hemoglobin 9.8 (L) 13.0 - 17.0 g/dL   HCT 30.0 (L) 39.0 - 52.0 %   MCV 82.9 78.0 - 100.0 fL   MCH 27.1 26.0 - 34.0 pg   MCHC 32.7 30.0 - 36.0 g/dL   RDW 15.3 11.5 - 15.5 %   Platelets 214 150 - 400 K/uL   Neutrophils Relative % 85 %   Lymphocytes Relative 9 %   Monocytes Relative 5 %   Eosinophils  Relative 1 %   Basophils Relative 0 %   Neutro Abs 14.5 (H) 1.7 - 7.7 K/uL   Lymphs Abs 1.5 0.7 - 4.0 K/uL   Monocytes Absolute 0.9 0.1 - 1.0 K/uL   Eosinophils Absolute 0.2 0.0 - 0.7 K/uL   Basophils Absolute 0.0 0.0 - 0.1 K/uL   RBC Morphology BURR CELLS    Smear Review LARGE PLATELETS PRESENT   Comprehensive metabolic panel     Status: Abnormal   Collection Time: 03/11/15  2:00 PM  Result Value Ref Range   Sodium 135 135 - 145 mmol/L   Potassium 3.9 3.5 - 5.1 mmol/L   Chloride 97 (L) 101 - 111 mmol/L   CO2 13 (L) 22 - 32 mmol/L   Glucose, Bld 36 (LL) 65 - 99 mg/dL    Comment: CRITICAL RESULT CALLED TO, READ BACK BY AND VERIFIED WITH: T ISLEY,RN 1944 03/01/2015 WBOND    BUN 91 (H) 6 - 20 mg/dL   Creatinine, Ser 4.87 (H) 0.61 - 1.24 mg/dL   Calcium 8.6 (L) 8.9 - 10.3 mg/dL   Total Protein 6.8 6.5 - 8.1 g/dL  Albumin 2.4 (L) 3.5 - 5.0 g/dL   AST 28 15 - 41 U/L   ALT 19 17 - 63 U/L   Alkaline Phosphatase 164 (H) 38 - 126 U/L   Total Bilirubin 1.1 0.3 - 1.2 mg/dL   GFR calc non Af Amer 12 (L) >60 mL/min   GFR calc Af Amer 14 (L) >60 mL/min    Comment: (NOTE) The eGFR has been calculated using the CKD EPI equation. This calculation has not been validated in all clinical situations. eGFR's persistently <60 mL/min signify possible Chronic Kidney Disease.    Anion gap 25 (H) 5 - 15  I-Stat Chem 8, ED     Status: Abnormal   Collection Time: 03/11/15  2:09 PM  Result Value Ref Range   Sodium 133 (L) 135 - 145 mmol/L   Potassium 3.7 3.5 - 5.1 mmol/L   Chloride 100 (L) 101 - 111 mmol/L   BUN 80 (H) 6 - 20 mg/dL   Creatinine, Ser 4.50 (H) 0.61 - 1.24 mg/dL   Glucose, Bld 48 (L) 65 - 99 mg/dL   Calcium, Ion 1.05 (L) 1.12 - 1.23 mmol/L   TCO2 14 0 - 100 mmol/L   Hemoglobin 11.9 (L) 13.0 - 17.0 g/dL   HCT 35.0 (L) 39.0 - 52.0 %  CBG monitoring, ED     Status: Abnormal   Collection Time: 03/11/15  3:35 PM  Result Value Ref Range   Glucose-Capillary 52 (L) 65 - 99 mg/dL   Glucose, capillary     Status: None   Collection Time: 03/11/15  7:52 PM  Result Value Ref Range   Glucose-Capillary 72 65 - 99 mg/dL  CBC     Status: Abnormal   Collection Time: 03/11/15  9:26 PM  Result Value Ref Range   WBC 15.1 (H) 4.0 - 10.5 K/uL   RBC 3.57 (L) 4.22 - 5.81 MIL/uL   Hemoglobin 9.6 (L) 13.0 - 17.0 g/dL   HCT 29.5 (L) 39.0 - 52.0 %   MCV 82.6 78.0 - 100.0 fL   MCH 26.9 26.0 - 34.0 pg   MCHC 32.5 30.0 - 36.0 g/dL   RDW 15.3 11.5 - 15.5 %   Platelets 218 150 - 400 K/uL  Creatinine, serum     Status: Abnormal   Collection Time: 03/11/15  9:26 PM  Result Value Ref Range   Creatinine, Ser 4.87 (H) 0.61 - 1.24 mg/dL   GFR calc non Af Amer 12 (L) >60 mL/min   GFR calc Af Amer 14 (L) >60 mL/min    Comment: (NOTE) The eGFR has been calculated using the CKD EPI equation. This calculation has not been validated in all clinical situations. eGFR's persistently <60 mL/min signify possible Chronic Kidney Disease.   CBC     Status: Abnormal   Collection Time: 03/12/15  5:02 AM  Result Value Ref Range   WBC 13.3 (H) 4.0 - 10.5 K/uL   RBC 3.32 (L) 4.22 - 5.81 MIL/uL   Hemoglobin 9.2 (L) 13.0 - 17.0 g/dL   HCT 27.5 (L) 39.0 - 52.0 %   MCV 82.8 78.0 - 100.0 fL   MCH 27.7 26.0 - 34.0 pg   MCHC 33.5 30.0 - 36.0 g/dL   RDW 15.7 (H) 11.5 - 15.5 %   Platelets 214 150 - 400 K/uL  Basic metabolic panel     Status: Abnormal   Collection Time: 03/12/15  5:02 AM  Result Value Ref Range   Sodium 133 (L) 135 - 145 mmol/L  Potassium 3.5 3.5 - 5.1 mmol/L   Chloride 101 101 - 111 mmol/L   CO2 14 (L) 22 - 32 mmol/L   Glucose, Bld 182 (H) 65 - 99 mg/dL   BUN 88 (H) 6 - 20 mg/dL   Creatinine, Ser 4.78 (H) 0.61 - 1.24 mg/dL   Calcium 7.8 (L) 8.9 - 10.3 mg/dL   GFR calc non Af Amer 13 (L) >60 mL/min   GFR calc Af Amer 15 (L) >60 mL/min    Comment: (NOTE) The eGFR has been calculated using the CKD EPI equation. This calculation has not been validated in all clinical  situations. eGFR's persistently <60 mL/min signify possible Chronic Kidney Disease.    Anion gap 18 (H) 5 - 15    Imaging / Studies: No results found.  Medications / Allergies:  Scheduled Meds: . aspirin  81 mg Oral Daily  . atorvastatin  40 mg Oral q1800  . cephALEXin  500 mg Oral QID  . darifenacin  7.5 mg Oral Daily  . docusate sodium  100 mg Oral BID  . feeding supplement (ENSURE ENLIVE)  237 mL Oral BID BM  . heparin  5,000 Units Subcutaneous 3 times per day  . hydrocortisone   Rectal TID  . insulin aspart  0-5 Units Subcutaneous QHS  . insulin aspart  0-9 Units Subcutaneous TID WC  . metoprolol tartrate  25 mg Oral BID  . polyethylene glycol  17 g Oral Daily   Continuous Infusions: . sodium chloride 125 mL/hr at 03/12/15 0816   PRN Meds:.acetaminophen **OR** acetaminophen, pramoxine  Antibiotics: Anti-infectives    Start     Dose/Rate Route Frequency Ordered Stop   03/11/15 1945  cephALEXin (KEFLEX) capsule 500 mg     500 mg Oral 4 times daily 03/11/15 1935          Assessment/Plan Large external hemorrhoids-continue anusol and initiate warm compresses.  No surgical indications. Bowel regimen, avoid constipation.    Erby Pian, [facility] Surgery Pager [phone]) For consults and floor pages call [phone](7A-4:30P)  03/12/2015 8:37 AM

## 2015-03-12 NOTE — Evaluation (Signed)
Occupational Therapy Evaluation Patient Details Name: Lawrence [name] MRN: [id number] DOB: [birth year] Today's Date: 03/12/2015    History of Present Illness Lawrence [name] is a 55 y.o. male presenting with dehydration, weakness . PMH is significant for DM2, HTN, hemorrhoids, CKD 4, Diastolic HF, secondary hyperparathyroidism, suprapubic catheter for urinary retention and spastic bladder, R BKA   Clinical Impression   Patient presenting with decreased ADL and functional mobility independence secondary to above. Patient mod I PTA. Patient currently functioning at an overall min to max assist level. Patient will benefit from acute OT to increase overall independence in the areas of ADLs, functional mobility, and overall safety in order to safely discharge to venue listed below. As of now, pt has been refusing SNF, clinically recommend SNF for increased independence and safety prior to patient discharging back home.   MD present during some of session and aware of pain in patient's RUE.     Follow Up Recommendations  SNF;Supervision/Assistance - 24 hour    Equipment Recommendations  Other (comment) (TBD next venue of care)    Recommendations for Other Services  None at this time   Precautions / Restrictions Precautions Precautions: Fall Required Braces or Orthoses: Other Brace/Splint Other Brace/Splint: R prosthetic Restrictions Weight Bearing Restrictions: No    Mobility Bed Mobility Overal bed mobility: Needs Assistance Bed Mobility: Rolling;Sidelying to Sit;Sit to Supine Rolling: Mod assist Sidelying to sit: Max assist   Sit to supine: Mod assist   General bed mobility comments: Increased assistance needed due to pain and generalized weakness, cues for technique and safety during bed mobility   Transfers Overall transfer level: Needs assistance Equipment used: Rolling walker (2 wheeled) Transfers: Sit to/from Stand;Lateral/Scoot Transfers Sit to Stand: Mod assist;+2 physical  assistance;From elevated surface General transfer comment: Did not occur, pt refused    Balance Overall balance assessment: Needs assistance Sitting-balance support: Feet unsupported;Bilateral upper extremity supported Sitting balance-Leahy Scale: Fair Sitting balance - Comments: In sitting, pt with increased pain in buttock. Pt leaning side to/from side to relieve some pressure/pain. During leaning to right pt lost balance and fell onto side, requiring max assist to sit back to EOB.    Standing balance support: Bilateral upper extremity supported Standing balance-Leahy Scale: Poor Standing balance comment: BUE supported on RW, c/o pain in R hand    ADL Overall ADL's : Needs assistance/impaired Eating/Feeding: Set up;Bed level   Grooming: Bed level;Minimal assistance   Upper Body Bathing: Minimal assitance;Bed level   Lower Body Bathing: Moderate assistance;Bed level   Upper Body Dressing : Minimal assistance;Bed level   Lower Body Dressing: Maximal assistance;Bed level     Toilet Transfer Details (indicate cue type and reason): pt refused General ADL Comments: Pt limited by increased pain in right hand, stating everything is harder because of this.     Pertinent Vitals/Pain Pain Assessment: 0-10 Pain Score: 7  Faces Pain Scale: Hurts even more Pain Location: external hemrroids and RUE Pain Descriptors / Indicators: Discomfort;Constant Pain Intervention(s): Limited activity within patient's tolerance;Monitored during session     Hand Dominance Left   Extremity/Trunk Assessment Upper Extremity Assessment Upper Extremity Assessment: Generalized weakness (right UE limited due to increased pain. Noted atrophy in dorsum of right hand.)   Lower Extremity Assessment Lower Extremity Assessment: Defer to PT evaluation RLE Deficits / Details: R BKA   Cervical / Trunk Assessment Cervical / Trunk Assessment: Kyphotic   Communication Communication Communication: No  difficulties   Cognition Arousal/Alertness: Awake/alert Behavior During Therapy: WFL for  tasks assessed/performed Overall Cognitive Status: Impaired/Different from baseline Area of Impairment: Safety/judgement Safety/Judgement: Decreased awareness of safety;Decreased awareness of deficits              Home Living Family/patient expects to be discharged to:: Private residence Living Arrangements: Other relatives (lives with uncles who is mentally disabled ) Available Help at Discharge: Family;Available PRN/intermittently Type of Home: House Home Access: Ramped entrance     Home Layout: One level     Bathroom Shower/Tub: Tub/shower unit;Other (comment) (strictly sponge bathes)   Bathroom Toilet: Standard     Home Equipment: Cane - quad;Wheelchair - manual;Bedside commode    Prior Functioning/Environment Level of Independence: Independent with assistive device(s)  Comments: Transfers with lateral scoot independently into w/c and uses [employer] bus to navigate the community. LIves with uncle who is metally disabled but he assists with the cooking    OT Diagnosis: Generalized weakness;Acute pain   OT Problem List: Decreased strength;Decreased activity tolerance;Impaired balance (sitting and/or standing);Decreased coordination;Decreased safety awareness;Decreased knowledge of use of DME or AE;Decreased knowledge of precautions;Pain   OT Treatment/Interventions: Self-care/ADL training;Therapeutic exercise;Energy conservation;DME and/or AE instruction;Therapeutic activities;Patient/family education;Balance training    OT Goals(Current goals can be found in the care plan section) Acute Rehab OT Goals Patient Stated Goal: Go home OT Goal Formulation: With patient Time For Goal Achievement: 03/26/15 Potential to Achieve Goals: Good ADL Goals Pt Will Perform Grooming: with modified independence;sitting Pt Will Perform Lower Body Bathing: with min assist;sit to/from stand Pt Will  Perform Lower Body Dressing: with min assist;sit to/from stand (including donning of prosthesis ) Pt Will Transfer to Toilet: with min assist;bedside commode;stand pivot transfer Additional ADL Goal #1: Pt will be supervision with bed mobility as a precursor for ADLs  OT Frequency: Min 2X/week   Barriers to D/C: Decreased caregiver support   End of Session Activity Tolerance: Patient limited by pain Patient left: in bed;with call bell/phone within reach   Time: 0981-1914 OT Time Calculation (min): 20 min Charges:  OT General Charges $OT Visit: 1 Procedure OT Evaluation $OT Eval Moderate Complexity: 1 Procedure  Edwin Cap , MS, OTR/L, CLT Pager: [phone]  03/12/2015, 10:56 AM

## 2015-03-13 ENCOUNTER — Other Ambulatory Visit: Payer: Self-pay | Admitting: *Deleted

## 2015-03-13 LAB — GLUCOSE, CAPILLARY
GLUCOSE-CAPILLARY: 240 mg/dL — AB (ref 65–99)
GLUCOSE-CAPILLARY: 224 mg/dL — AB (ref 65–99)
Glucose-Capillary: 181 mg/dL — ABNORMAL HIGH (ref 65–99)
Glucose-Capillary: 246 mg/dL — ABNORMAL HIGH (ref 65–99)

## 2015-03-13 LAB — BASIC METABOLIC PANEL
Anion gap: 16 — ABNORMAL HIGH (ref 5–15)
Anion gap: 15 (ref 5–15)
BUN: 76 mg/dL — AB (ref 6–20)
BUN: 75 mg/dL — ABNORMAL HIGH (ref 6–20)
CHLORIDE: 95 mmol/L — AB (ref 101–111)
CHLORIDE: 95 mmol/L — AB (ref 101–111)
CO2: 21 mmol/L — ABNORMAL LOW (ref 22–32)
CO2: 23 mmol/L (ref 22–32)
CREATININE: 3.76 mg/dL — AB (ref 0.61–1.24)
Calcium: 7.5 mg/dL — ABNORMAL LOW (ref 8.9–10.3)
Calcium: 7.8 mg/dL — ABNORMAL LOW (ref 8.9–10.3)
Creatinine, Ser: 3.68 mg/dL — ABNORMAL HIGH (ref 0.61–1.24)
GFR calc Af Amer: 20 mL/min — ABNORMAL LOW (ref >60)
GFR calc non Af Amer: 17 mL/min — ABNORMAL LOW (ref >60)
GFR, EST AFRICAN AMERICAN: 20 mL/min — AB (ref >60)
GFR, EST NON AFRICAN AMERICAN: 17 mL/min — AB (ref >60)
GLUCOSE: 191 mg/dL — AB (ref 65–99)
Glucose, Bld: 236 mg/dL — ABNORMAL HIGH (ref 65–99)
POTASSIUM: 2.9 mmol/L — AB (ref 3.5–5.1)
POTASSIUM: 3.1 mmol/L — AB (ref 3.5–5.1)
SODIUM: 133 mmol/L — AB (ref 135–145)
Sodium: 132 mmol/L — ABNORMAL LOW (ref 135–145)

## 2015-03-13 MED ORDER — POTASSIUM CHLORIDE CRYS ER 20 MEQ PO TBCR
40.0000 meq | EXTENDED_RELEASE_TABLET | Freq: Once | ORAL | Status: AC
  Administered 2015-03-13: 40 meq via ORAL
  Filled 2015-03-13: qty 2

## 2015-03-13 MED ORDER — BRIMONIDINE TARTRATE 0.2 % OP SOLN
1.0000 [drp] | Freq: Two times a day (BID) | OPHTHALMIC | Status: DC
  Administered 2015-03-13 – 2015-03-16 (×6): 1 [drp] via OPHTHALMIC
  Filled 2015-03-13: qty 5

## 2015-03-13 MED ORDER — POTASSIUM CHLORIDE CRYS ER 20 MEQ PO TBCR: 40.0000 meq | EXTENDED_RELEASE_TABLET | Freq: Three times a day (TID) | ORAL | Status: DC

## 2015-03-13 MED ORDER — INSULIN GLARGINE 100 UNIT/ML ~~LOC~~ SOLN: 10.0000 [IU] | Freq: Every morning | SUBCUTANEOUS | Status: DC

## 2015-03-13 MED ORDER — BRINZOLAMIDE 1 % OP SUSP
1.0000 [drp] | Freq: Two times a day (BID) | OPHTHALMIC | Status: DC
  Administered 2015-03-13 – 2015-03-16 (×6): 1 [drp] via OPHTHALMIC
  Filled 2015-03-13: qty 10

## 2015-03-13 MED ORDER — INSULIN GLARGINE 100 UNIT/ML ~~LOC~~ SOLN
5.0000 [IU] | Freq: Every morning | SUBCUTANEOUS | Status: DC
  Administered 2015-03-13: 5 [IU] via SUBCUTANEOUS
  Filled 2015-03-13 (×2): qty 0.05

## 2015-03-13 MED ORDER — HYDROCODONE-ACETAMINOPHEN 5-325 MG PO TABS
1.0000 | ORAL_TABLET | Freq: Two times a day (BID) | ORAL | Status: DC | PRN
  Administered 2015-03-13 – 2015-03-16 (×4): 1 via ORAL
  Filled 2015-03-13 (×5): qty 1

## 2015-03-13 MED ORDER — GABAPENTIN 100 MG PO CAPS
100.0000 mg | ORAL_CAPSULE | Freq: Three times a day (TID) | ORAL | Status: DC
  Administered 2015-03-13 – 2015-03-15 (×4): 100 mg via ORAL
  Filled 2015-03-13 (×5): qty 1

## 2015-03-13 MED ORDER — POTASSIUM CHLORIDE CRYS ER 20 MEQ PO TBCR
40.0000 meq | EXTENDED_RELEASE_TABLET | Freq: Three times a day (TID) | ORAL | Status: DC
  Administered 2015-03-13: 40 meq via ORAL
  Filled 2015-03-13: qty 2

## 2015-03-13 NOTE — Consult Note (Signed)
   [facility] CM Inpatient Consult   03/13/2015  Skyy Mcknight [facility] 1960/08/17 [id number]   EPIC referral for [facility] Care Management services. Went to speak with patient at bedside to discuss and offer [facility] services. He reports he lives with his mentally challenged. States he uses [employer] for transportation. He denies having issues with affording medication. Confirms Primary Care MD is with [facility]. Discussed recommendations for SNF placement. Patient reports discharge plan is "up in the air" but he states he does not want to go to rehab. Explained that he will receive post hospital transition of care calls and will be evaluated for monthly home visits. Explained that [facility] will not interfere or replace services provided by home health. Confirmed best contact number as [phone]. Discussed bedside visit with inpatient RNCM. Left [facility] Care Management packet and contact information at bedside.   Raiford Noble, MSN-Ed, RN,BSN [facility] Liaison [phone]

## 2015-03-13 NOTE — Progress Notes (Signed)
Patient ID: Lawrence [name], male   DOB: 06/14/1960, 55 y.o.   MRN: [id number]     [facility]      [address] [place], [place] [address]    Phone: [phone] FAX: [phone]     Subjective: Feels better.   Objective:  Vital signs:  Filed Vitals:   03/12/15 0700 03/12/15 1827 03/12/15 2217 03/13/15 0422  BP: 163/57 150/69 134/55 137/48  Pulse: 101 97 65 75  Temp: 97.7 F (36.5 C) 97.1 F (36.2 C) 97.4 F (36.3 C) 98.6 F (37 C)  TempSrc: Oral Oral Oral Oral  Resp: _0 Height:      Weight:   84.097 kg (185 lb 6.4 oz)   SpO2: 99% 98% 97% 94%       Intake/Output   Yesterday:  02/01 0701 - 02/02 0700 In: 2430.8 [P.O.:660; I.V.:1770.8] Out: 1975 [Urine:1975] This shift:  Total I/O In: -  Out: 525 [Urine:525]   Physical Exam: General: Pt awake/alert/oriented x4 in no acute distress Anorectal-large external hemorrhoids, non thrombosed, non tender and edematous.   Problem List:   Principal Problem:   Dehydration Active Problems:   DM (diabetes mellitus), type 2, uncontrolled (HCC)   HTN (hypertension)   Bladder spasms   Hemorrhoids   Acute renal failure superimposed on stage 4 chronic kidney disease (HCC)   Hemorrhoids, external, thrombosed   Physical deconditioning   Protein-calorie malnutrition, severe    Results:   Labs: Results for orders placed or performed during the hospital encounter of 03/11/15 (from the past 48 hour(s))  CBC with Differential     Status: Abnormal   Collection Time: 03/11/15  2:00 PM  Result Value Ref Range   WBC 17.1 (H) 4.0 - 10.5 K/uL   RBC 3.62 (L) 4.22 - 5.81 MIL/uL   Hemoglobin 9.8 (L) 13.0 - 17.0 g/dL   HCT 30.0 (L) 39.0 - 52.0 %   MCV 82.9 78.0 - 100.0 fL   MCH 27.1 26.0 - 34.0 pg   MCHC 32.7 30.0 - 36.0 g/dL   RDW 15.3 11.5 - 15.5 %   Platelets 214 150 - 400 K/uL   Neutrophils Relative % 85 %   Lymphocytes Relative 9 %   Monocytes Relative 5 %   Eosinophils Relative 1 %   Basophils Relative 0 %   Neutro Abs 14.5 (H) 1.7 - 7.7 K/uL   Lymphs Abs 1.5 0.7 - 4.0 K/uL   Monocytes Absolute 0.9 0.1 - 1.0 K/uL   Eosinophils Absolute 0.2 0.0 - 0.7 K/uL   Basophils Absolute 0.0 0.0 - 0.1 K/uL   RBC Morphology BURR CELLS    Smear Review LARGE PLATELETS PRESENT   Comprehensive metabolic panel     Status: Abnormal   Collection Time: 03/11/15  2:00 PM  Result Value Ref Range   Sodium 135 135 - 145 mmol/L   Potassium 3.9 3.5 - 5.1 mmol/L   Chloride 97 (L) 101 - 111 mmol/L   CO2 13 (L) 22 - 32 mmol/L   Glucose, Bld 36 (LL) 65 - 99 mg/dL    Comment: CRITICAL RESULT CALLED TO, READ BACK BY AND VERIFIED WITH: T ISLEY,RN 1944 03/01/2015 WBOND    BUN 91 (H) 6 - 20 mg/dL   Creatinine, Ser 4.87 (H) 0.61 - 1.24 mg/dL   Calcium 8.6 (L) 8.9 - 10.3 mg/dL   Total Protein 6.8 6.5 - 8.1 g/dL   Albumin 2.4 (L) 3.5 - 5.0  g/dL   AST 28 15 - 41 U/L   ALT 19 17 - 63 U/L   Alkaline Phosphatase 164 (H) 38 - 126 U/L   Total Bilirubin 1.1 0.3 - 1.2 mg/dL   GFR calc non Af Amer 12 (L) >60 mL/min   GFR calc Af Amer 14 (L) >60 mL/min    Comment: (NOTE) The eGFR has been calculated using the CKD EPI equation. This calculation has not been validated in all clinical situations. eGFR's persistently <60 mL/min signify possible Chronic Kidney Disease.    Anion gap 25 (H) 5 - 15  I-Stat Chem 8, ED     Status: Abnormal   Collection Time: 03/11/15  2:09 PM  Result Value Ref Range   Sodium 133 (L) 135 - 145 mmol/L   Potassium 3.7 3.5 - 5.1 mmol/L   Chloride 100 (L) 101 - 111 mmol/L   BUN 80 (H) 6 - 20 mg/dL   Creatinine, Ser 4.50 (H) 0.61 - 1.24 mg/dL   Glucose, Bld 48 (L) 65 - 99 mg/dL   Calcium, Ion 1.05 (L) 1.12 - 1.23 mmol/L   TCO2 14 0 - 100 mmol/L   Hemoglobin 11.9 (L) 13.0 - 17.0 g/dL   HCT 35.0 (L) 39.0 - 52.0 %  CBG monitoring, ED     Status: Abnormal   Collection Time: 03/11/15  3:35 PM  Result Value Ref Range   Glucose-Capillary 52 (L) 65 - 99  mg/dL  Glucose, capillary     Status: None   Collection Time: 03/11/15  7:52 PM  Result Value Ref Range   Glucose-Capillary 72 65 - 99 mg/dL  CBC     Status: Abnormal   Collection Time: 03/11/15  9:26 PM  Result Value Ref Range   WBC 15.1 (H) 4.0 - 10.5 K/uL   RBC 3.57 (L) 4.22 - 5.81 MIL/uL   Hemoglobin 9.6 (L) 13.0 - 17.0 g/dL   HCT 29.5 (L) 39.0 - 52.0 %   MCV 82.6 78.0 - 100.0 fL   MCH 26.9 26.0 - 34.0 pg   MCHC 32.5 30.0 - 36.0 g/dL   RDW 15.3 11.5 - 15.5 %   Platelets 218 150 - 400 K/uL  Creatinine, serum     Status: Abnormal   Collection Time: 03/11/15  9:26 PM  Result Value Ref Range   Creatinine, Ser 4.87 (H) 0.61 - 1.24 mg/dL   GFR calc non Af Amer 12 (L) >60 mL/min   GFR calc Af Amer 14 (L) >60 mL/min    Comment: (NOTE) The eGFR has been calculated using the CKD EPI equation. This calculation has not been validated in all clinical situations. eGFR's persistently <60 mL/min signify possible Chronic Kidney Disease.   CBC     Status: Abnormal   Collection Time: 03/12/15  5:02 AM  Result Value Ref Range   WBC 13.3 (H) 4.0 - 10.5 K/uL   RBC 3.32 (L) 4.22 - 5.81 MIL/uL   Hemoglobin 9.2 (L) 13.0 - 17.0 g/dL   HCT 27.5 (L) 39.0 - 52.0 %   MCV 82.8 78.0 - 100.0 fL   MCH 27.7 26.0 - 34.0 pg   MCHC 33.5 30.0 - 36.0 g/dL   RDW 15.7 (H) 11.5 - 15.5 %   Platelets 214 150 - 400 K/uL  Basic metabolic panel     Status: Abnormal   Collection Time: 03/12/15  5:02 AM  Result Value Ref Range   Sodium 133 (L) 135 - 145 mmol/L   Potassium 3.5 3.5 -  5.1 mmol/L   Chloride 101 101 - 111 mmol/L   CO2 14 (L) 22 - 32 mmol/L   Glucose, Bld 182 (H) 65 - 99 mg/dL   BUN 88 (H) 6 - 20 mg/dL   Creatinine, Ser 4.78 (H) 0.61 - 1.24 mg/dL   Calcium 7.8 (L) 8.9 - 10.3 mg/dL   GFR calc non Af Amer 13 (L) >60 mL/min   GFR calc Af Amer 15 (L) >60 mL/min    Comment: (NOTE) The eGFR has been calculated using the CKD EPI equation. This calculation has not been validated in all clinical  situations. eGFR's persistently <60 mL/min signify possible Chronic Kidney Disease.    Anion gap 18 (H) 5 - 15  Glucose, capillary     Status: Abnormal   Collection Time: 03/12/15  8:03 AM  Result Value Ref Range   Glucose-Capillary 187 (H) 65 - 99 mg/dL  Glucose, capillary     Status: Abnormal   Collection Time: 03/12/15 11:52 AM  Result Value Ref Range   Glucose-Capillary 194 (H) 65 - 99 mg/dL  Glucose, capillary     Status: Abnormal   Collection Time: 03/12/15  4:12 PM  Result Value Ref Range   Glucose-Capillary 129 (H) 65 - 99 mg/dL  Glucose, capillary     Status: Abnormal   Collection Time: 03/12/15  4:56 PM  Result Value Ref Range   Glucose-Capillary 144 (H) 65 - 99 mg/dL  Glucose, capillary     Status: Abnormal   Collection Time: 03/12/15  9:19 PM  Result Value Ref Range   Glucose-Capillary 170 (H) 65 - 99 mg/dL  Basic metabolic panel     Status: Abnormal   Collection Time: 03/13/15  5:00 AM  Result Value Ref Range   Sodium 132 (L) 135 - 145 mmol/L   Potassium 2.9 (L) 3.5 - 5.1 mmol/L    Comment: DELTA CHECK NOTED   Chloride 95 (L) 101 - 111 mmol/L   CO2 21 (L) 22 - 32 mmol/L   Glucose, Bld 191 (H) 65 - 99 mg/dL   BUN 76 (H) 6 - 20 mg/dL   Creatinine, Ser 3.76 (H) 0.61 - 1.24 mg/dL   Calcium 7.5 (L) 8.9 - 10.3 mg/dL   GFR calc non Af Amer 17 (L) >60 mL/min   GFR calc Af Amer 20 (L) >60 mL/min    Comment: (NOTE) The eGFR has been calculated using the CKD EPI equation. This calculation has not been validated in all clinical situations. eGFR's persistently <60 mL/min signify possible Chronic Kidney Disease.    Anion gap 16 (H) 5 - 15  Glucose, capillary     Status: Abnormal   Collection Time: 03/13/15  7:31 AM  Result Value Ref Range   Glucose-Capillary 181 (H) 65 - 99 mg/dL   Comment 1 Notify RN    Comment 2 Document in Chart     Imaging / Studies: No results found.  Medications / Allergies:  Scheduled Meds: . amLODipine  5 mg Oral Daily  . aspirin  81  mg Oral Daily  . atorvastatin  40 mg Oral q1800  . darifenacin  7.5 mg Oral Daily  . docusate sodium  100 mg Oral BID  . feeding supplement (PRO-STAT SUGAR FREE 64)  30 mL Oral TID  . gabapentin  300 mg Oral TID  . heparin  5,000 Units Subcutaneous 3 times per day  . hydrocortisone   Rectal TID  . insulin aspart  0-5 Units Subcutaneous QHS  . insulin  aspart  0-9 Units Subcutaneous TID WC  . insulin glargine  5 Units Subcutaneous q morning - 10a  . metoprolol tartrate  25 mg Oral BID  . polyethylene glycol  17 g Oral Daily  . potassium chloride  40 mEq Oral TID   Continuous Infusions:  PRN Meds:.acetaminophen **OR** acetaminophen, hydrALAZINE, HYDROcodone-acetaminophen, pramoxine  Antibiotics: Anti-infectives    Start     Dose/Rate Route Frequency Ordered Stop   03/12/15 1400  cephALEXin (KEFLEX) capsule 500 mg  Status:  Discontinued     500 mg Oral 3 times per day 03/12/15 0946 03/12/15 1401   03/11/15 1945  cephALEXin (KEFLEX) capsule 500 mg  Status:  Discontinued     500 mg Oral 4 times daily 03/11/15 1935 03/12/15 0946       Assessment/Plan Large external hemorrhoids-continue anusol and initiate warm compresses.improving.  Bowel regimen, avoid constipation.  Please call with further assistance.    Erby Pian, [facility] Pager [phone]) For consults and floor pages call [phone](7A-4:30P)  03/13/2015 10:33 AM

## 2015-03-13 NOTE — Progress Notes (Signed)
Patient stated he would like to try sitting in the chair.The tech and I attempted to transfer the patient in to the chair. After about 3-5 mins. The patient said he could not sit in the chair anymore because his hemorrhoids were hurting too much. We then transferred the patient back in to the bed.

## 2015-03-13 NOTE — Progress Notes (Signed)
Physical Therapy Treatment Patient Details Name: Lawrence [name] MRN: [id number] DOB: 05/11/1960 Today's Date: 03/13/2015    History of Present Illness Lawrence [name] is a 55 y.o. male presenting with dehydration, weakness . 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 Pt requiring Max A to achieve sitting EOB and maintains +2 status to stand EOB for brief seconds. Yet, to transfer into chair due to pain levels. Pt is worried his Uncle, whom he lives with will not have proper care if he goes to rehab. Spent time explaining benefits of rehab and safety concerns if he is to return home. Pt is beginning to understand his deficits. Verbalized not wanting to share a room at rehab or go "to a home" but is willing to look at options for a short rehab stay.   Follow Up Recommendations  SNF;Supervision/Assistance - 24 hour     Equipment Recommendations  None recommended by PT    Recommendations for Other Services       Precautions / Restrictions Precautions Precautions: Fall Required Braces or Orthoses: Other Brace/Splint Other Brace/Splint: R prosthetic Restrictions Weight Bearing Restrictions: No    Mobility  Bed Mobility Overal bed mobility: Needs Assistance Bed Mobility: Rolling;Sit to Supine;Sidelying to Sit Rolling: Mod assist Sidelying to sit: Max assist   Sit to supine: Min assist   General bed mobility comments: Pt requring Max use of bed rails for rolling and needing physical assistance to complete, requiring Max A to right trunk despite VC's and HOB elevated., able to scoot towards HOB once pt returned to supine  Transfers Overall transfer level: Needs assistance Equipment used: Rolling walker (2 wheeled) Transfers: Sit to/from Stand Sit to Stand: Mod assist;+2  physical assistance         General transfer comment: +2 assistance needed for pt's confidence, trunk excessively flexed, able to correct fro about 5 seconds before needing to sit due to pain  Ambulation/Gait                 Stairs            Wheelchair Mobility    Modified Rankin (Stroke Patients Only)       Balance Overall balance assessment: Needs assistance Sitting-balance support: Feet unsupported;Bilateral upper extremity supported Sitting balance-Leahy Scale: Poor Sitting balance - Comments: Pt required min A - min guard assist to maintain dynamic sitting EOB    Standing balance support: Bilateral upper extremity supported Standing balance-Leahy Scale: Poor Standing balance comment: stood 5 seconds heavy reliance on UEs on RW                    Cognition Arousal/Alertness: Awake/alert Behavior During Therapy: WFL for tasks assessed/performed Overall Cognitive Status: Impaired/Different from baseline Area of Impairment: Safety/judgement;Problem solving         Safety/Judgement: Decreased awareness of safety;Decreased awareness of deficits   Problem Solving: Slow processing;Requires verbal cues General Comments: May be pateint's baseline, however he is adamant upon going home despite safety concerns    Exercises Amputee Exercises Gluteal Sets: AROM;Both;10 reps;Supine Hip ABduction/ADduction: AROM;5 reps;Both;Supine Hip Flexion/Marching: AROM;5 reps;Right;Supine Knee Extension: AROM;Right;5 reps;Supine Other Exercises Other Exercises: Pt instructed in desensitization techniques for Rt UE     General Comments        Pertinent Vitals/Pain  Pain Assessment: 0-10 Pain Score: 5  Pain Location: R hand, hemrroids Pain Descriptors / Indicators: Burning Pain Intervention(s): Limited activity within patient's tolerance;Monitored during session    Home Living                      Prior Function            PT Goals (current  goals can now be found in the care plan section) Acute Rehab PT Goals Patient Stated Goal: go home Progress towards PT goals: Progressing toward goals    Frequency  Min 3X/week    PT Plan Current plan remains appropriate    Co-evaluation             End of Session Equipment Utilized During Treatment: Gait belt Activity Tolerance: Patient tolerated treatment well;Patient limited by pain Patient left: in bed;with call bell/phone within reach     Time: 1110-1130 PT Time Calculation (min) (ACUTE ONLY): 20 min  Charges:  $Therapeutic Activity: 8-22 mins                    G Codes:      Ulyses Jarred 28-Mar-2015, 1:45 PM Ulyses Jarred, Student Physical Therapist Acute Rehab [phone]

## 2015-03-13 NOTE — Progress Notes (Signed)
Family Medicine Teaching Service Daily Progress Note Intern Pager: [phone]  Patient name: Lawrence [name] Medical record number: [id number] Date of birth: [birth year] Age: 55 y.o. Gender: male  Primary Care Provider: [name], MD Consultants: Surgery  Code Status: Full   Pt Overview and Major Events to Date:    Assessment and Plan: Lawrence [name] is a 55 y.o. male presenting with dehydration, weakness . 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 K+ 2.9, Na 132.  - BMET @ 2PM - D/C Sodium Bicarb + free water @ 125 ml  - Now able to eat and drink without issue  - Repleted  KDUR 40 meq x 2  - Consult nephrology   Left Hand Pain and Numbness. Pain and numbness has improved, no longer present. Unlikely steal syndrome as AV fistula is not new in its establishment. Likely due to neuropathy  - Will start gabapentin 100 mg TID for hand pain  - Home Norco  BID for pain   External Hemorrhoids: about 8 mm by 5 mm in dimension posterior to anal opening. No active bleeding. No concern for thrombosis per surgery.  - Continue Anusol cream and Proctofoam  - General surgery consulted. No surgical intervention for now. Hot compresses q4 hours, topical pain cream.Sits baths when able. - Donut cushion  - PT/OT consult recommend SNF, will discuss with patient   DM2: on Lantus 20 u qhs. A1c 7.8 in 10/2014. CBG 181 - Start 5 units Lantus  - Sens SSI - CBG qac and HS  HTN: BP 163/57 - Continue home Lopressor - Norvasc 5 mg to help control HTN  - Hydralazine for bp greater than 180 or 110   Diastolic HF: Grade 2 diastolic on echo 02/4780. EF 50% though inadequate evaluation - Continue Lopressor - Hold  lasix  - Repeat echo outpatient as clinically indicated  FEN/GI:  - Heart healthy diet.    Prophylaxis: sub-q heparin given AoCKD  Disposition: home once able to sit up and take fluids    Subjective:  Patient able to sit up and eat breakfast as well as drink. Hemorrhoid pain has diminished. Patient is not willing to go to a SNF at this time. He feels that he has adequate sources at home to take care of himself.  Objective: Temp:  [97.1 F (36.2 C)-98.6 F (37 C)] 98.6 F (37 C) (02/02 0422) Pulse Rate:  [65-97] 75 (02/02 0422) Resp:  [16-20] 16 (02/02 0422) BP: (134-150)/(48-69) 137/48 mmHg (02/02 0422) SpO2:  [94 %-98 %] 94 % (02/02 0422) Weight:  [185 lb 6.4 oz (84.097 kg)] 185 lb 6.4 oz (84.097 kg) (02/01 2217) Physical Exam: General: Alert and oriented 3  Cardiovascular: Regular rate rhythm no murmurs, gallops, rubs Respiratory: CTAB, no wheezing  Abdomen:  BS+, protuberant abdomen, no ttp Extremities: BKA of right leg, no lower extremity edema in left leg  Laboratory:  Recent Labs Lab 03/11/15 1400 03/11/15 1409 03/11/15 2126 03/12/15 0502  WBC 17.1*  --  15.1* 13.3*  HGB 9.8* 11.9* 9.6* 9.2*  HCT 30.0* 35.0* 29.5* 27.5*  PLT 214  --  218 214    Recent Labs Lab 03/11/15 1400 03/11/15 1409 03/11/15 2126 03/12/15 0502 03/13/15 0500  NA 135 133*  --  133*  132*  K 3.9 3.7  --  3.5 2.9*  CL 97* 100*  --  101 95*  CO2 13*  --   --  14* 21*  BUN 91* 80*  --  88* 76*  CREATININE 4.87* 4.50* 4.87* 4.78* 3.76*  CALCIUM 8.6*  --   --  7.8* 7.5*  PROT 6.8  --   --   --   --   BILITOT 1.1  --   --   --   --   ALKPHOS 164*  --   --   --   --   ALT 19  --   --   --   --   AST 28  --   --   --   --   GLUCOSE 36* 48*  --  182* 191*    Imaging/Diagnostic Tests: No results found.   Xandrea Clarey Mayra Reel, MD 03/13/2015, 9:38 AM PGY-1, [facility] Family Medicine FPTS Intern pager: [phone], text pages welcome

## 2015-03-13 NOTE — Progress Notes (Signed)
Occupational Therapy Treatment Patient Details Name: Lawrence [name] MRN: [id number] DOB: [birth year] Today's Date: 03/13/2015    History of present illness Lawrence [name] is a 55 y.o. male presenting with dehydration, weakness . 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  He required min A - min guard assist for sitting balance while he attempted  unsuccessfully to don  His prosthesis.   Currently, he requires mod - total A for ADLs, and was unable to attempt to stand or transfer.   He lives with his cognitively disabled uncle.   He reports he "thinks" his uncle can provide max A, but is unsure of uncle's physical capabilities.   Long discussion with pt re: safety, risk of falls, bedsores, PNA, and readmission if he chooses to discharge home, and that the recommendation at this time is for SNF.  Pt reports he is open to  "listening" to what his SNF options are, but that he wants to go home.  IF he discharges home recommend HHOT, RN, PT, SW, aide, hospital bed, BSC.  Would likely benefit from a hoyer lift, but unsure if his uncle has the capability to safely maneuver it.     Follow Up Recommendations  SNF;Supervision/Assistance - 24 hour; if he discharges home recommend HHOT, PT, SW, RN, aide    Equipment Recommendations  Hospital bed;Other (comment) (possible hoyer lift, but unsure uncle can manage this safely), BSC   Recommendations for Other Services      Precautions / Restrictions Precautions Precautions: Fall Required Braces or Orthoses: Other Brace/Splint Other Brace/Splint: R prosthetic Restrictions Weight Bearing Restrictions: No       Mobility Bed Mobility Overal bed mobility: Needs Assistance Bed Mobility: Rolling;Supine to Sit;Sit to Supine Rolling: Mod assist Sidelying to sit:  Max assist   Sit to supine: Min assist   General bed mobility comments: Pt requires assist with all aspects.  Discussed need for hospital bed if he chooses to go home.  He states he would prefer his bed.  Requested pt attempt to reposition himself without use of bedrails.  Without use of bedrails, he requires total - max A for rolling and scooting in bed   Transfers                 General transfer comment: Unable to attempt     Balance Overall balance assessment: Needs assistance Sitting-balance support: Single extremity supported Sitting balance-Leahy Scale: Poor Sitting balance - Comments: Pt required min A - min guard assist to maintain dynamic sitting EOB                            ADL Overall ADL's : Needs assistance/impaired                     Lower Body Dressing: Total assistance;Bed level Lower Body Dressing Details (indicate cue type and reason): Pt attempted to don prosthesis, but couldn't do so due to increased Rt hand pain, hemorhhoidal pain  and impaired balance    Toilet Transfer Details (indicate cue type and reason): Pt unable to attempt            General ADL Comments: Pt significantly limited by Rt UE pain, peri pain (hemorrhoids), and impaired balance.   Long discussion with  pt re: safety at home, current functional level, risk of bed sores, falls, PNA.  He is insistent that he wants to go home.  He states he "thinks", his uncle can physically assist him, but is unsure.  Pt did state he would "listen" to his options re: choices of SNF (he reports bad experience at [facility]      Vision                     Perception     Praxis      Cognition   Behavior During Therapy: [facility] for tasks assessed/performed Overall Cognitive Status: Impaired/Different from baseline Area of Impairment: Safety/judgement          Safety/Judgement: Decreased awareness of safety;Decreased awareness of deficits     General Comments: This  may be patient's baseline level of functioning     Extremity/Trunk Assessment               Exercises Other Exercises Other Exercises: Pt instructed in desensitization techniques for Rt UE    Shoulder Instructions       General Comments      Pertinent Vitals/ Pain       Pain Assessment: 0-10 Pain Score: 6  Pain Location: Rt hand and peri area  Pain Descriptors / Indicators: Burning;Constant;Numbness;Pins and needles Pain Intervention(s): Monitored during session;Limited activity within patient's tolerance  Home Living                                          Prior Functioning/Environment              Frequency Min 2X/week     Progress Toward Goals  OT Goals(current goals can now be found in the care plan section)  Progress towards OT goals: Progressing toward goals (slowly )  ADL Goals Pt Will Perform Grooming: with modified independence;sitting Pt Will Perform Lower Body Bathing: with min assist;sit to/from stand Pt Will Perform Lower Body Dressing: with min assist;sit to/from stand Pt Will Transfer to Toilet: with min assist;bedside commode;stand pivot transfer Additional ADL Goal #1: Pt will be supervision with bed mobility as a precursor for ADLs  Plan Discharge plan remains appropriate    Co-evaluation                 End of Session     Activity Tolerance Patient limited by pain   Patient Left in bed;with call bell/phone within reach   Nurse Communication Mobility status        Time: 1010-1050 OT Time Calculation (min): 40 min  Charges: OT General Charges $OT Visit: 1 Procedure OT Treatments $Self Care/Home Management : 23-37 mins $Therapeutic Activity: 8-22 mins  Breck Hollinger M 03/13/2015, 11:28 AM

## 2015-03-14 ENCOUNTER — Inpatient Hospital Stay (HOSPITAL_COMMUNITY): Payer: Medicare Other

## 2015-03-14 LAB — BASIC METABOLIC PANEL
Anion gap: 12 (ref 5–15)
BUN: 84 mg/dL — AB (ref 6–20)
CHLORIDE: 96 mmol/L — AB (ref 101–111)
CO2: 25 mmol/L (ref 22–32)
CREATININE: 3.74 mg/dL — AB (ref 0.61–1.24)
Calcium: 8.3 mg/dL — ABNORMAL LOW (ref 8.9–10.3)
GFR calc Af Amer: 20 mL/min — ABNORMAL LOW (ref >60)
GFR calc non Af Amer: 17 mL/min — ABNORMAL LOW (ref >60)
Glucose, Bld: 284 mg/dL — ABNORMAL HIGH (ref 65–99)
Potassium: 4 mmol/L (ref 3.5–5.1)
Sodium: 133 mmol/L — ABNORMAL LOW (ref 135–145)

## 2015-03-14 LAB — CK: CK TOTAL: 73 U/L (ref 49–397)

## 2015-03-14 LAB — GLUCOSE, CAPILLARY
Glucose-Capillary: 222 mg/dL — ABNORMAL HIGH (ref 65–99)
Glucose-Capillary: 250 mg/dL — ABNORMAL HIGH (ref 65–99)
Glucose-Capillary: 285 mg/dL — ABNORMAL HIGH (ref 65–99)
Glucose-Capillary: 319 mg/dL — ABNORMAL HIGH (ref 65–99)

## 2015-03-14 LAB — TSH: TSH: 1.796 u[IU]/mL (ref 0.350–4.500)

## 2015-03-14 MED ORDER — INSULIN GLARGINE 100 UNIT/ML ~~LOC~~ SOLN
10.0000 [IU] | Freq: Every morning | SUBCUTANEOUS | Status: DC
  Administered 2015-03-14: 10 [IU] via SUBCUTANEOUS
  Filled 2015-03-14 (×2): qty 0.1
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
## 2015-03-14 NOTE — Care Management Important Message (Signed)
Important Message  Patient Details  Name: Lawrence [name] MRN: [id number] Date of Birth: May 30, 1960   Medicare Important Message Given:  Yes    Khloei Spiker P Felma Pfefferle 03/14/2015, 4:22 PM

## 2015-03-14 NOTE — NC FL2 (Signed)
[place] MEDICAID FL2 LEVEL OF CARE SCREENING TOOL     IDENTIFICATION  Patient Name: Lawrence [name] Birthdate: [birth year] Sex: male Admission Date (Current Location): 03/11/2015  [facility] and [place] Number:  Producer, television/film/video and Address:  [facility], [address], [place], [place] [address]      Provider Number: [id number]  Attending Physician Name and Address:  Carney Living, MD  Relative Name and Phone Number:  [name] - sister - [phone]    Current Level of Care: SNF Recommended Level of Care: Skilled Nursing Facility Prior Approval Number:    Date Approved/Denied:   PASRR Number: [id number] (Eff. 08/19/12)  Discharge Plan: SNF    Current Diagnoses: Patient Active Problem List   Diagnosis Date Noted  . Protein-calorie malnutrition, severe 03/12/2015  . Dehydration 03/11/2015  . Hemorrhoids 03/11/2015  . Acute renal failure superimposed on stage 4 chronic kidney disease (HCC) 03/11/2015  . Hemorrhoids, external, thrombosed 03/11/2015  . Physical deconditioning 03/11/2015  . HTN (hypertension) 08/09/2014  . Suprapubic catheter (HCC) 08/09/2014  . Bladder spasms 08/09/2014  . Redness of eye, right 01/15/2014  . Secondary hyperparathyroidism (HCC) 11/28/2013  . Diastolic heart failure (HCC) 11/12/2013  . Normocytic anemia 10/10/2013  . Tinnitus of both ears 10/09/2013  . Surgical wound dehiscence 10/04/2012  . Dehiscence of amputation stump (HCC) 10/04/2012  . Unilateral complete BKA--right 08/22/2012  . Osteomyelitis of right foot (HCC) 08/17/2012    Class: Diagnosis of  . Chronic cough 02/22/2012  . Hematuria 03/23/2011  . Prostate asymmetry 03/23/2011  . Neuropathy (HCC) 08/25/2010  . Chronic kidney disease (CKD), stage 4 03/14/2009  . DM (diabetes mellitus), type 2, uncontrolled (HCC) 12/26/2007  . HYPERCHOLESTEROLEMIA 12/26/2007    Orientation RESPIRATION BLADDER Height & Weight     Self, Time,  Situation, Place  Normal Continent, Indwelling catheter (Suprpubic catheter placed 1/23) Weight: 185 lb 6.4 oz (84.097 kg) Height:   (165.1 cm)  BEHAVIORAL SYMPTOMS/MOOD NEUROLOGICAL BOWEL NUTRITION STATUS      Continent Diet (Renal-Carb modified)  AMBULATORY STATUS COMMUNICATION OF NEEDS Skin   Extensive Assist (Patient unable to ambulate with physical therapy) Verbally Other (Comment) (DTI sacrum.  External Hemorrhoids: about 8 mm by 5 mm in dimension posterior to anal opening)                       Personal Care Assistance Level of Assistance  Bathing, Feeding, Dressing Bathing Assistance: Maximum assistance Feeding assistance: Independent Dressing Assistance: Maximum assistance     Functional Limitations Info  Sight, Hearing, Speech Sight Info: Adequate Hearing Info: Adequate Speech Info: Adequate    SPECIAL CARE FACTORS FREQUENCY  PT (By licensed PT), OT (By licensed OT)     PT Frequency: Evaluated 2/1 and minimum of 3x per week recommended OT Frequency: Evaluated 2/1 and minimum of 2x per week recommended            Contractures Contractures Info: Not present    Additional Factors Info  Code Status, Allergies, Insulin Sliding Scale Code Status Info: Full code Allergies Info: No known allergies   Insulin Sliding Scale Info: 4 times per day sliding scale       Current Medications (03/14/2015):  This is the current hospital active medication list Current Facility-Administered Medications  Medication Dose Route Frequency Provider Last Rate Last Dose  . acetaminophen (TYLENOL) tablet 650 mg  650 mg Oral Q6H PRN Ashly M Gottschalk, DO   650 mg  at 03/14/15 1118   Or  . acetaminophen (TYLENOL) suppository 650 mg  650 mg Rectal Q6H PRN Raliegh Ip, DO      . amLODipine (NORVASC) tablet 5 mg  5 mg Oral Daily Asiyah Mayra Reel, MD   5 mg at 03/13/15 0951  . aspirin chewable tablet 81 mg  81 mg Oral Daily Carney Living, MD   81 mg at 03/14/15 1020   . atorvastatin (LIPITOR) tablet 40 mg  40 mg Oral q1800 Ashly M Gottschalk, DO   40 mg at 03/13/15 1716  . brinzolamide (AZOPT) 1 % ophthalmic suspension 1 drop  1 drop Left Eye BID Narda Bonds, MD   1 drop at 03/14/15 1024   And  . brimonidine (ALPHAGAN) 0.2 % ophthalmic solution 1 drop  1 drop Left Eye BID Narda Bonds, MD   1 drop at 03/14/15 1024  . darifenacin (ENABLEX) 24 hr tablet 7.5 mg  7.5 mg Oral Daily Ashly M Gottschalk, DO   7.5 mg at 03/14/15 1019  . docusate sodium (COLACE) capsule 100 mg  100 mg Oral BID Ashly M Gottschalk, DO   100 mg at 03/14/15 1021  . feeding supplement (PRO-STAT SUGAR FREE 64) liquid 30 mL  30 mL Oral TID Marinell Blight, RD   30 mL at 03/14/15 1033  . gabapentin (NEURONTIN) capsule 100 mg  100 mg Oral TID Asiyah Mayra Reel, MD   100 mg at 03/13/15 2130  . heparin injection 5,000 Units  5,000 Units Subcutaneous 3 times per day Raliegh Ip, DO   5,000 Units at 03/13/15 2130  . hydrALAZINE (APRESOLINE) injection 5 mg  5 mg Intravenous Q6H PRN Asiyah Mayra Reel, MD      . HYDROcodone-acetaminophen (NORCO/VICODIN) 5-325 MG per tablet 1 tablet  1 tablet Oral BID PRN Asiyah Mayra Reel, MD   1 tablet at 03/13/15 2130  . hydrocortisone (ANUSOL-HC) 2.5 % rectal cream   Rectal TID Raliegh Ip, DO      . insulin aspart (novoLOG) injection 0-5 Units  0-5 Units Subcutaneous QHS Raliegh Ip, DO   3 Units at 03/13/15 2137  . insulin aspart (novoLOG) injection 0-9 Units  0-9 Units Subcutaneous TID WC Raliegh Ip, DO   3 Units at 03/14/15 1250  . insulin glargine (LANTUS) injection 10 Units  10 Units Subcutaneous q morning - 10a Asiyah Mayra Reel, MD   10 Units at 03/14/15 1121  . metoprolol tartrate (LOPRESSOR) tablet 25 mg  25 mg Oral BID Ashly M Gottschalk, DO   25 mg at 03/14/15 1020  . polyethylene glycol (MIRALAX / GLYCOLAX) packet 17 g  17 g Oral Daily Raliegh Ip, DO   17 g at 03/11/15 2122  . pramoxine (PROCTOFOAM) 1 %  foam   Rectal TID PRN Raliegh Ip, DO   1 application at 03/12/15 1608     Discharge Medications: Please see discharge summary for a list of discharge medications.  Relevant Imaging Results:  Relevant Lab Results:   Additional Information Right BKA and right leg prothesis.  Cristobal Goldmann, LCSW
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
## 2015-03-14 NOTE — Clinical Social Work Note (Signed)
Clinical Social Work Assessment  Patient Details  Name: Lawrence [name] MRN: [id number] Date of Birth: [birth year]  Date of referral:  03/14/15               Reason for consult:  Facility Placement                Permission sought to share information with:  Facility Industrial/product designer granted to share information::  Yes, Verbal Permission Granted  Name::        Agency::     Relationship::     Contact Information:     Housing/Transportation Living arrangements for the past 2 months:  Single Family Home Source of Information:  Patient Patient Interpreter Needed:  None Criminal Activity/Legal Involvement Pertinent to Current Situation/Hospitalization:    Significant Relationships:  None Lives with:  Relatives (Patient's mentally challenged ) Patient cares for his "mentally challenged" uncle - Tommy Ademy.  Do you feel safe going back to the place where you live?  No (Patient aware that he needs rehab before returning home) Need for family participation in patient care:  No (Coment)  Care giving concerns:  None expressed by patient.   Social Worker assessment / plan:  CSW talked with patient about discharge planning and recommendation of ST rehab. Mr. Martine is in agreement and reported that he has been to a skilled facility before, [facility] about 2 years ago.  Patient concerned about his uncle but added that family is looking after him.   Employment status:  Disabled (Comment on whether or not currently receiving Disability) Insurance information:  Teacher, English as a foreign language [facility]) PT Recommendations:  Skilled Nursing Facility Information / Referral to community resources:  Skilled Nursing Facility (Patient provided with SNF list)  Patient/Family's Response to care:  No concerns expressed regarding care while in hospital.  Patient/Family's Understanding of and Emotional Response to Diagnosis, Current Treatment, and Prognosis:  Not discussed.  Emotional Assessment Appearance:   Appears older than stated age Attitude/Demeanor/Rapport:  Other (Appropriate) Affect (typically observed):  Appropriate, Flat Orientation:  Oriented to Self, Oriented to Place, Oriented to  Time, Oriented to Situation Alcohol / Substance use:  Alcohol Use (Patient reports that he does not smoke but does drink a beer occassionallyt) Psych involvement (Current and /or in the community):  No (Comment)  Discharge Needs  Concerns to be addressed:  Discharge Planning Concerns Readmission within the last 30 days:  No Current discharge risk:  None Barriers to Discharge:  No Barriers Identified   Cristobal Goldmann, LCSW 03/14/2015, 4:09 PM

## 2015-03-14 NOTE — Clinical Social Work Placement (Addendum)
   CLINICAL SOCIAL WORK PLACEMENT  NOTE   Date:  03/14/2015  Patient Details  Name: Lawrence [name] MRN: [id number] Date of Birth: [birth year]  Clinical Social Work is seeking post-discharge placement for this patient at the Skilled  Nursing Facility level of care (*CSW will initial, date and re-position this form in  chart as items are completed):  Yes   Patient/family provided with [facility] Clinical Social Work Department's list of facilities offering this level of care within the geographic area requested by the patient (or if unable, by the patient's family).  Yes   Patient/family informed of their freedom to choose among providers that offer the needed level of care, that participate in Medicare, Medicaid or managed care program needed by the patient, have an available bed and are willing to accept the patient.  Yes   Patient/family informed of [facility]'s ownership interest in [facility] and [facility], as well as of the fact that they are under no obligation to receive care at these facilities.  PASRR submitted to EDS on       PASRR number received on       Existing PASRR number confirmed on 03/14/15     FL2 transmitted to all facilities in geographic area requested by pt/family on 03/14/15     FL2 transmitted to all facilities within larger geographic area on       Patient informed that his/her managed care company has contracts with or will negotiate with certain facilities, including the following:         03/14/15 - Patient/family informed of bed offers received.  Patient chooses bed at  [name]     Physician recommends and patient chooses bed at      Patient to be transferred to  [name] on  .03/16/15  Patient to be transferred to facility by     [facility]  Patient family notified on   of transfer. Unsuccessful. No answer. No VM.  Name of family member notified:      Sister Susie.  Did not make contact. PHYSICIAN       Additional Comment:     _______________________________________________ Cristobal Goldmann, LCSW 03/14/2015, 4:14 PM

## 2015-03-14 NOTE — Progress Notes (Addendum)
FPTS Interim Progress Note  S: Patient's mood improved this afternoon. Per patient history, has ways had some weakness in his arms however has never been diagnosed with any health problems. States that he did have a medically difficult birth, however no hx of CP.  Patient states his right hand with contractures over the past 2 years after AV fistula placement.   O: BP 152/58 mmHg  Pulse 84  Temp(Src) 98.4 F (36.9 C) (Oral)  Resp 16  Ht  (1.651 m)  Wt 185 lb 6.4 oz (84.097 kg)  BMI 30.85 kg/m2  SpO2 97%  Neuro Exam: 4/5  strength in upper extremities, right more than left. Lower extremity strength 5/5. Sensation normal throughout. Cranial 2-12 intact. Finger to nose intact. Right pupil midline and mid size, and not reactive to light, left pupil reactive to light.Right hand with contractures creating pain. 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   A/P:  Weakness: Patient states weakness over the past 2 weeks. Has been able to sit up and take care of himself in the past and not currently able to do this. Neuro exam significant for bilateral upper extremity weakness greater in right arm. Patient also found to be generally impaired in right eye. He states that he was diagnosed with a glaucoma 6 months ago and that is when he developed visual impairment. Differential deconditioning versus other organic causes. - Will get TSH and CK   Eunie Lawn Mayra Reel, MD 03/14/2015, 4:36 PM PGY-1, [facility] Family Medicine Service pager [phone]

## 2015-03-15 DIAGNOSIS — M62521 Muscle wasting and atrophy, not elsewhere classified, right upper arm: Secondary | ICD-10-CM

## 2015-03-15 LAB — BASIC METABOLIC PANEL
Anion gap: 14 (ref 5–15)
BUN: 93 mg/dL — AB (ref 4–21)
BUN: 93 mg/dL — ABNORMAL HIGH (ref 6–20)
CHLORIDE: 96 mmol/L — AB (ref 101–111)
CO2: 24 mmol/L (ref 22–32)
Calcium: 8.8 mg/dL — ABNORMAL LOW (ref 8.9–10.3)
Creatinine, Ser: 3.38 mg/dL — ABNORMAL HIGH (ref 0.61–1.24)
Creatinine: 3.4 mg/dL — AB (ref 0.6–1.3)
GFR calc Af Amer: 22 mL/min — ABNORMAL LOW (ref >60)
GFR calc non Af Amer: 19 mL/min — ABNORMAL LOW (ref >60)
GLUCOSE: 264 mg/dL
Glucose, Bld: 264 mg/dL — ABNORMAL HIGH (ref 65–99)
POTASSIUM: 3.8 mmol/L (ref 3.5–5.1)
POTASSIUM: 3.8 mmol/L (ref 3.4–5.3)
SODIUM: 134 mmol/L — AB (ref 135–145)
Sodium: 134 mmol/L — AB (ref 137–147)

## 2015-03-15 LAB — GLUCOSE, CAPILLARY
GLUCOSE-CAPILLARY: 263 mg/dL — AB (ref 65–99)
GLUCOSE-CAPILLARY: 224 mg/dL — AB (ref 65–99)
GLUCOSE-CAPILLARY: 244 mg/dL — AB (ref 65–99)
GLUCOSE-CAPILLARY: 253 mg/dL — AB (ref 65–99)

## 2015-03-15 MED ORDER — PRAMOXINE HCL 1 % RE FOAM: Freq: Three times a day (TID) | RECTAL | Status: DC | PRN

## 2015-03-15 MED ORDER — INSULIN GLARGINE 100 UNIT/ML ~~LOC~~ SOLN
15.0000 [IU] | Freq: Every morning | SUBCUTANEOUS | Status: DC
  Administered 2015-03-15 – 2015-03-16 (×2): 15 [IU] via SUBCUTANEOUS
  Filled 2015-03-15 (×3): qty 0.15

## 2015-03-15 MED ORDER — AMLODIPINE BESYLATE 5 MG PO TABS: 5.0000 mg | ORAL_TABLET | Freq: Every day | ORAL | Status: DC

## 2015-03-15 MED ORDER — DOCUSATE SODIUM 100 MG PO CAPS: 100.0000 mg | ORAL_CAPSULE | Freq: Two times a day (BID) | ORAL | Status: DC

## 2015-03-15 MED ORDER — GABAPENTIN 100 MG PO CAPS: 100.0000 mg | ORAL_CAPSULE | Freq: Three times a day (TID) | ORAL | Status: DC

## 2015-03-15 MED ORDER — POLYETHYLENE GLYCOL 3350 17 G PO PACK: 17.0000 g | PACK | Freq: Every day | ORAL | Status: DC

## 2015-03-15 MED ORDER — SENNA 8.6 MG PO TABS
1.0000 | ORAL_TABLET | Freq: Every day | ORAL | Status: DC
  Administered 2015-03-15 – 2015-03-16 (×2): 8.6 mg via ORAL
  Filled 2015-03-15 (×2): qty 1

## 2015-03-15 NOTE — Consult Note (Signed)
CC:  Chief Complaint  Patient presents with  . Dehydration    HPI: Lawrence [name] is a 55 y.o. male initially admitted for hemorrhoid pain last week. He says he was ambulatory at home, but after admission was not able to walk. He also has been complaining of right arm weakness and pain which has been ongoing over the last few weeks. He does not report any neck pain, and the pain in his right arm is currently fairly localized around the wrist. He does not report pain down the arm. MRI brain and cervical spine was obtained by the primary service and neurosurgical consultation was requested.  Of note, he does have an extensive medical history significant for ESRD, diastolic HF, peripheral vascular disease, and diabetes.  PMH: Past Medical History  Diagnosis Date  . Hypertension   . Chronic kidney disease   . Hypercholesteremia   . PONV (postoperative nausea and vomiting)   . Peripheral vascular disease (HCC)   . H/O hiatal hernia     hx of years ago   . Pneumonia 10/23/2013  . IDDM (insulin dependent diabetes mellitus) (HCC)   . Depression 08/2012    "just when foot first got cut off"    PSH: Past Surgical History  Procedure Laterality Date  . Amputation Right 05/03/2012    Procedure: Right First Ray AMPUTATION;  Surgeon: Eldred Manges, MD;  Location: [facility] OR;  Service: Orthopedics;  Laterality: Right;  . I&d extremity Right 05/29/2012    Procedure: IRRIGATION AND DEBRIDEMENT EXTREMITY- right foot;  Surgeon: Eldred Manges, MD;  Location: [facility] OR;  Service: Orthopedics;  Laterality: Right;  Right Foot Debridement, VAC Application  . Amputation Right 08/16/2012    Procedure: AMPUTATION BELOW KNEE;  Surgeon: Eldred Manges, MD;  Location: [facility] OR;  Service: Orthopedics;  Laterality: Right;  Right Below Knee Amputation  . Amputation Right 10/04/2012    Procedure: AMPUTATION BELOW KNEE REVISION;  Surgeon: Eldred Manges, MD;  Location: [facility] OR;  Service: Orthopedics;  Laterality: Right;  . Insertion of  suprapubic catheter N/A 03/02/2013    Procedure: INSERTION OF SUPRAPUBIC CATHETER;  Surgeon: Su Grand, MD;  Location: [facility] ORS;  Service: Urology;  Laterality: N/A;  . Tonsillectomy  ~ 1967  . Av fistula placement Right 11/01/2013    Procedure: RIGHT ARM ARTERIOVENOUS (AV) FISTULA CREATION;  Surgeon: Chuck Hint, MD;  Location: [facility] OR;  Service: Vascular;  Laterality: Right;  . Abdominal aortagram N/A 05/03/2012    Procedure: ABDOMINAL Ronny Flurry;  Surgeon: Sherren Kerns, MD;  Location: [facility] CATH LAB;  Service: Cardiovascular;  Laterality: N/A;  . Lower extremity angiogram Right 05/03/2012    Procedure: LOWER EXTREMITY ANGIOGRAM;  Surgeon: Sherren Kerns, MD;  Location: [facility] CATH LAB;  Service: Cardiovascular;  Laterality: Right;  rt leg angio    SH: Social History  Substance Use Topics  . Smoking status: Never Smoker   . Smokeless tobacco: Never Used  . Alcohol Use: Yes     Comment: 10/23/2013 "couple times/month I'll have a beer"    MEDS: Prior to Admission medications   Medication Sig Start Date End Date Taking? Authorizing Provider  aspirin 81 MG tablet Take 81 mg by mouth daily.    Yes Historical Provider, MD  atorvastatin (LIPITOR) 40 MG tablet Take 1 tablet (40 mg total) by mouth daily. 03/04/15  Yes Tobey Grim, MD  Brinzolamide-Brimonidine [facility]) 1-0.2 % SUSP Place 1 drop into the left eye 2 (two) times daily.  Yes Historical Provider, MD  calcitRIOL (ROCALTROL) 0.25 MCG capsule Take 1 capsule (0.25 mcg total) by mouth daily. 11/01/13  Yes Ardith Dark, MD  cephALEXin (KEFLEX) 500 MG capsule Take 1 capsule (500 mg total) by mouth 4 (four) times daily. 03/07/15  Yes Shari Upstill, PA-C  EASY TOUCH PEN NEEDLES 31G X 8 MM MISC USE AS DIRECTED TO INJECT INSULIN. 10/09/14  Yes Latrelle Dodrill, MD  furosemide (LASIX) 40 MG tablet Take 1 tablet (40 mg total) by mouth 2 (two) times daily. 03/04/15  Yes Tobey Grim, MD  HYDROcodone-acetaminophen (NORCO/VICODIN) 5-325  MG per tablet Take 1-2 tablets by mouth every 4 (four) hours as needed. Patient taking differently: Take 1-2 tablets by mouth every 4 (four) hours as needed for moderate pain.  10/07/14  Yes Tiffany Neva Seat, PA-C  hydrocortisone (ANUSOL-HC) 2.5 % rectal cream Apply rectally 2 times daily 03/07/15  Yes Samantha Lane Hacker, PA-C  Insulin Glargine (LANTUS SOLOSTAR) 100 UNIT/ML Solostar Pen Inject 20 Units into the skin daily at 10 pm. 03/04/15  Yes Tobey Grim, MD  metoprolol tartrate (LOPRESSOR) 25 MG tablet Take 1 tablet (25 mg total) by mouth 2 (two) times daily. 03/04/15  Yes Tobey Grim, MD  solifenacin (VESICARE) 5 MG tablet Take 5 mg by mouth daily.   Yes Historical Provider, MD  Stann Ore INSULIN SYRINGE 30G X 1/2" 0.3 ML MISC USE ONE SYRINGE AT BEDTIME 11/15/13  Yes [name], MD    ALLERGY: No Known Allergies  ROS: ROS  NEUROLOGIC EXAM: Awake, alert, oriented Memory and concentration grossly intact Speech fluent, appropriate CN grossly intact Motor exam: Upper Extremities Deltoid Bicep Tricep Grip  Right 5/5 4/5 4/5 3/5  Left 5/5 5/5 5/5 4/5   Lower Extremity IP Quad PF DF EHL  Right 5/5 5/5  BKA -->    Left 5/5 5/5 5/5 5/5 5/5   Sensation grossly intact to LT  [facility]: MRI brain demonstrates mild ventriculomegaly without evidence of transependymal flow. There is mild periventricular white matter disease. No acute changes.  MRI C-spine shows preservation of cervical lordosis. There is a congenitally narrow canal, with superimposed disc bulge and ligamentous hypertrophy leading to moderate stenosis primarily from C4-5 to C6-7.  IMPRESSION: - 55 y.o. male with primarily generalized weakness. His presentation is not consistent with normal pressure hydrocephalus. I suspect this is his baseline venticular size. His degenerative cervical spondylosis with stenosis could cause some of his symptoms, although he doesn't have overt signs of myelopathy (albeit this could be  masked by his underlying diabetes). Regardless, his cervical spondylosis is a chronic problem and can be managed on an outpatient basis after he has recovered from the current admission.  PLAN: - Would proceed with PT/OT and possibly SNF placement - I will plan on f/u with him in the outpatient clinic in a few weeks to further discuss possible treatment of his cervical stenosis knowing he is a suboptimal medical candidate for surgery given his multiple medical problems.

## 2015-03-15 NOTE — Progress Notes (Signed)
Family Medicine Teaching Service Daily Progress Note Intern Pager: [phone]  Patient name: Lawrence [name] Medical record number: [id number] Date of birth: 03/12/1960 Age: 55 y.o. Gender: male  Primary Care Provider: [name], MD Consultants: Surgery  Code Status: Full   Pt Overview and Major Events to Date:    Assessment and Plan: Lawrence [name] is a 55 y.o. male presenting with dehydration, weakness . PMH is significant for DM2, HTN, hemorrhoids, CKD 4, Diastolic HF, secondary hyperparathyroidism, suprapubic catheter for urinary retention and spastic bladder, R BKA  Acute on Chronic renal failure, stage 4: Cr 4.5.> 4.78>3.76>3.74  (baseline around 2.6-2.9) likely 2/2 dehydration in the setting of pain from possibly thrombosed hemorrhoids. - Able to eat and drink without issue, has been able to do so  - BMET f/u today;   Generalized Weakness: Not improved. MRI head significant for mild to moderate degree of diffuse ventriculomegaly. Differential considerations include Chronic Communicating Hydrocephalus, Normal Pressure Hydrocephalus, and less likely Ex Vacuo enlargement. 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 CK 73 and TSH 1.76, within normal limits  - Neurosurgery consulted   Left Hand Pain and Numbness. Pain and numbness in leg. Unlikely steal syndrome as AV fistula is not new in its establishment. Likely due to neuropathy  - Will start gabapentin 100 mg TID for hand pain and Norco  BID for pain   External Hemorrhoids: about 8 mm by 5 mm in dimension posterior to anal opening. No active bleeding. No concern for thrombosis per surgery.  - Continue Anusol cream, Proctofoam, and warm compresses, signed off for general Surgery  - Donut cushion  - PT/OT consult recommend SNF, will discuss with patient   DM2: on Lantus 20 u qhs. A1c 7.8 in 10/2014. CBG 181 - Increase 15 units Lantus  - Sens  SSI - CBG qac and HS  HTN: BP 153/68 - Continue home Lopressor - Norvasc 5 mg to help control HTN  - Hydralazine for bp greater than 180 or 110   Diastolic HF: Grade 2 diastolic on echo 02/4780. EF 50% though inadequate evaluation - Continue Lopressor - Hold lasix  - Repeat echo outpatient as clinically indicated  FEN/GI:  - Heart healthy diet.   - Senakot, Miralax   Prophylaxis: sub-q heparin given AoCKD  Disposition:  SNF  Subjective:  Patient doing well this morning. Complaining of right hand pain this AM. States he received Norco x1 last night for the pain which helped with the pain. No other complaints.    Objective: Temp:  [98.4 F (36.9 C)-99.1 F (37.3 C)] 99.1 F (37.3 C) (02/04 0542) Pulse Rate:  [75-84] 75 (02/04 0542) Resp:  [16-17] 16 (02/04 0542) BP: (146-153)/(58-68) 153/68 mmHg (02/04 0542) SpO2:  [93 %-97 %] 96 % (02/04 0542) Weight:  [188 lb (85.276 kg)] 188 lb (85.276 kg) (02/03 2145) Physical Exam: General: Alert and oriented 3  Cardiovascular: Regular rate rhythm no murmurs, gallops, rubs Respiratory: CTAB, no wheezing  Abdomen:  BS+, protuberant abdomen, no ttp Extremities: BKA of right leg, no lower extremity edema in left leg  Laboratory:  Recent Labs Lab 03/11/15 1400 03/11/15 1409 03/11/15 2126 03/12/15 0502  WBC 17.1*  --  15.1* 13.3*  HGB 9.8* 11.9* 9.6* 9.2*  HCT 30.0* 35.0* 29.5* 27.5*  PLT 214  --  218 214    Recent Labs Lab 03/11/15 1400  03/13/15 0500 03/13/15 1144 03/14/15 0530  NA 135  < >  132* 133* 133*  K 3.9  < > 2.9* 3.1* 4.0  CL 97*  < > 95* 95* 96*  CO2 13*  < > 21* 23 25  BUN 91*  < > 76* 75* 84*  CREATININE 4.87*  < > 3.76* 3.68* 3.74*  CALCIUM 8.6*  < > 7.5* 7.8* 8.3*  PROT 6.8  --   --   --   --   BILITOT 1.1  --   --   --   --   ALKPHOS 164*  --   --   --   --   ALT 19  --   --   --   --   AST 28  --   --   --   --   GLUCOSE 36*  < > 191* 236* 284*  < > = values in this interval not  displayed.  Imaging/Diagnostic Tests: Mr Lawrence [name] Contrast  03/14/2015  CLINICAL DATA:  55 year old male with bilateral lower extremity weakness, right upper extremity weakness. Initial encounter. EXAM: MRI HEAD WITHOUT CONTRAST TECHNIQUE: Multiplanar, multiecho pulse sequences of the brain and surrounding structures were obtained without intravenous contrast. COMPARISON:  Cervical spine MRI from today reported separately. FINDINGS: Mild to moderate ventriculomegaly, all ventricles affected. No evidence of transependymal edema. No strong evidence of aqueductal stenosis, and furthermore axial T2 images suggest hyperdynamic flow in the cerebral aqueduct (series 6, image 8). There is superimposed generalized decreased for age cerebral volume. No focal encephalomalacia identified. Wallace Cullens and white matter signal is within normal limits for age throughout the brain. No chronic cerebral blood products. No restricted diffusion to suggest acute infarction. No midline shift, mass effect, evidence of mass lesion, extra-axial collection or acute intracranial hemorrhage. Cervicomedullary junction and pituitary are within normal limits. Major intracranial vascular flow voids are preserved. Chronic mastoid sclerosis appears stable since 2009. Both tympanic cavities also appear chronically opacified. Other Visible internal auditory structures appear normal. Mild paranasal sinus mucosal thickening. Postoperative changes to both globes. Negative scalp soft tissues. Normal bone marrow signal. IMPRESSION: 1. Mild to moderate degree of diffuse ventriculomegaly. Differential considerations include Chronic Communicating Hydrocephalus, Normal Pressure Hydrocephalus, and less likely Ex Vacuo enlargement (but see also #2). 2. Cerebral volume does appear diminished for age in a generalized fashion. No focal encephalomalacia, and overall preserved gray and white matter signal. 3. 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  Electronically Signed   By: Odessa Fleming M.D.   On: 03/14/2015 20:36   Mr Cervical Spine Wo Contrast  03/14/2015  CLINICAL DATA:  55 year old male with bilateral lower extremity weakness, right upper extremity weakness. Initial encounter. EXAM: MRI CERVICAL SPINE WITHOUT CONTRAST TECHNIQUE: Multiplanar, multisequence MR imaging of the cervical spine was performed. No intravenous contrast was administered. COMPARISON:  Neck CT 07/29/2007. FINDINGS: Large body habitus. Preserved cervical lordosis. Bulky anterior C1-C2 osteophytosis better demonstrated on the 2009 CT. Trace associated prevertebral fluid about the C2 osteophyte, most likely degenerative in nature. No marrow edema or evidence of acute osseous abnormality. Suggestion of colpocephaly, incompletely evaluated. Visualized posterior fossa structures appear normal. Cervicomedullary junction is within normal limits. No signal abnormality in the visualized spinal cord. Large body habitus, mild subcutaneous edema posteriorly, a no definite acute paraspinal soft tissue abnormality. Partially visible bilateral sternoclavicular joint degeneration with joint effusions. 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 The following superimposed degenerative changes are noted: C2-C3:  Negative. C3-C4: Mild disc bulge. Borderline to mild spinal stenosis. Uncovertebral hypertrophy with moderate right C4  foraminal stenosis. C4-C5: Mild disc bulge. Spinal stenosis with no definite spinal cord mass effect. Uncovertebral hypertrophy with mild to moderate bilateral C5 foraminal stenosis. C5-C6: Broad-based posterior disc osteophyte complex best seen on series 6, image 18. Moderate ligament flavum hypertrophy. Spinal stenosis with mild to moderate spinal cord mass effect. Uncovertebral hypertrophy, but only mild C6 foraminal stenosis. C6-C7: Right eccentric broad-based posterior disc bulge. Mild to moderate ligament flavum hypertrophy. Spinal stenosis with up to mild spinal  cord mass effect. Uncovertebral hypertrophy with moderate to severe right C7 foraminal stenosis. C7-T1:  Mild facet hypertrophy. No upper thoracic spinal stenosis. IMPRESSION: 1. 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 No associated cord signal abnormality. 2. Lesser cervical spinal stenosis otherwise from C3-C4 to C6-C7. 3. Degenerative moderate or severe neural foraminal stenosis at the right C4 and right C7 nerve levels. 4. Chronic bulky osteophytosis at the anterior C1-C2 articulation with trace degenerative appearing prevertebral fluid there. No acute osseous abnormality identified. Electronically Signed   By: Odessa Fleming M.D.   On: 03/14/2015 20:18     Anays Detore Mayra Reel, MD 03/15/2015, 8:41 AM PGY-1, [place] [facility] FPTS Intern pager: [phone], text pages welcome

## 2015-03-15 NOTE — Discharge Summary (Signed)
Family Medicine Teaching [facility] Discharge Summary  Patient name: Lawrence [name] Medical record number: [id number] Date of birth: 1960/10/01 Age: 55 y.o. Gender: male Date of Admission: 03/11/2015  Date of Discharge: 03/16/2015 Admitting Physician: Carney Living, MD  Primary Care Provider: [name], MD Consultants: Neurosurgery, Surgery   Indication for Hospitalization: Acute on Chronic Kidney Injury   Discharge Diagnoses/Problem List:  CKD stage 4 T2DM with neuropathy Suprapubic catheter  HLD Diastolic Heart Failure  HTN Hemorrhoids  Protein- Calorie Malnutrition  Compressive neuropathy  Disposition:  SNF   Discharge Condition: Improved   Discharge Exam:  General: Resting comfortably in bed in NAD Cardiovascular: RRR, no murmurs appreciated Respiratory: CTAB, no wheezing  Abdomen: BS+, soft, non-tender, non-distended Extremities: BKA of right leg, atrophy and contraction of R arm Neuro: Alert; oriented to person, place, and time; no focal deficits   Brief Hospital Course:  Patient presenting with dehydration and weakness due to pain associated with hemorrhoids. Patient found to have acute kidney injury with a initial creatinine of 4.5, baseline around 2.6-2.9. Patient started on IV fluids to rehydration and then transitioned appropriately to PO intake during hospitalization, with continued down-trending creatinine. Surgery consulted for  Hemorrhoids, and indicated that no surgery was needed at this time. Advised patient to continue Anusol cream and Proctofoam and warm compresses.  During admission patient was found to have increased generalized weakness, as well as R hand weakness and pain, possible organic causes were investigated including MRI head and spine, TSH, and CK. MRI head showing some hydrocephalus, per neurosurgery not consistent with normal pressure hydrocephalus, more consistent with normal presentation in the presence of CKD with no need for  intervention. Noted to have some degenerative cervical spondylosis and stenosis, however no need for surgical intervention during this hospitalization. Per imaging, compressive imaging seems most likely cause of patient's R hand pain.   Patient's electrolytes and kidney function improved with patient able to maintain adequate hydration and nutrition and patient was considered ready for discharge to SNF.  Issues for Follow Up:  1. Right upper quadrant ultrasound finding of 1 cm polyp in gallbladder which carries small risk of carcinoma could consider outpatient surgical follow-up or annual ultrasound at minimum. 2. Follow-up with BMET to ensure downtrending creatinine. 3. Possible neurosurgical follow-up for cervical spinal stenosis as an outpatient. 4. Please continue Proctofoam and Anusol or hemorrhoid pain. 5. Started gabapentin 100 mg TID for possibly neuropathic pain in hand. Can continue norco BID for pain control PRN.  6. Use MiraLAX and Colace daily to ensure patient does not have constipation to prevent worsening of hemorrhoids.  7. Lasix was discontinued during this admission due to kidney injury. Started Norvasc 5 mg for blood pressure instead. Consider restarting lasix once kidney function improves.  Significant Procedures:  No results found.  Significant Labs and Imaging:   Recent Labs Lab 03/11/15 1400 03/11/15 1409 03/11/15 2126 03/12/15 0502  WBC 17.1*  --  15.1* 13.3*  HGB 9.8* 11.9* 9.6* 9.2*  HCT 30.0* 35.0* 29.5* 27.5*  PLT 214  --  218 214    Recent Labs Lab 03/11/15 1400  03/12/15 0502 03/13/15 0500 03/13/15 1144 03/14/15 0530 03/15/15 0900  NA 135  < > 133* 132* 133* 133* 134*  K 3.9  < > 3.5 2.9* 3.1* 4.0 3.8  CL 97*  < > 101 95* 95* 96* 96*  CO2 13*  --  14* 21* GLUCOSE 36*  < > 182* 191* 236* 284* 264*  BUN 91*  < > 88* 76* 75* 84* 93*  CREATININE 4.87*  < > 4.78* 3.76* 3.68* 3.74* 3.38*  CALCIUM 8.6*  --  7.8* 7.5* 7.8* 8.3* 8.8*   ALKPHOS 164*  --   --   --   --   --   --   AST 28  --   --   --   --   --   --   ALT 19  --   --   --   --   --   --   ALBUMIN 2.4*  --   --   --   --   --   --   < > = values in this interval not displayed.   Results/Tests Pending at Time of Discharge: None   Discharge Medications:    Medication List    STOP taking these medications        cephALEXin 500 MG capsule  Commonly known as:  KEFLEX     furosemide 40 MG tablet  Commonly known as:  LASIX      TAKE these medications        amLODipine 5 MG tablet  Commonly known as:  NORVASC  Take 1 tablet (5 mg total) by mouth daily.     aspirin 81 MG tablet  Take 81 mg by mouth daily.     atorvastatin 40 MG tablet  Commonly known as:  LIPITOR  Take 1 tablet (40 mg total) by mouth daily.     calcitRIOL 0.25 MCG capsule  Commonly known as:  ROCALTROL  Take 1 capsule (0.25 mcg total) by mouth daily.     docusate sodium 100 MG capsule  Commonly known as:  COLACE  Take 1 capsule (100 mg total) by mouth 2 (two) times daily.     EASY TOUCH PEN NEEDLES 31G X 8 MM Misc  Generic drug:  Insulin Pen Needle  USE AS DIRECTED TO INJECT INSULIN.     gabapentin 100 MG capsule  Commonly known as:  NEURONTIN  Take 1 capsule (100 mg total) by mouth 3 (three) times daily.     HYDROcodone-acetaminophen 5-325 MG tablet  Commonly known as:  NORCO/VICODIN  Take 1-2 tablets by mouth every 4 (four) hours as needed.     hydrocortisone 2.5 % rectal cream  Commonly known as:  ANUSOL-HC  Apply rectally 2 times daily     Insulin Glargine 100 UNIT/ML Solostar Pen  Commonly known as:  LANTUS SOLOSTAR  Inject 20 Units into the skin daily at 10 pm.     metoprolol tartrate 25 MG tablet  Commonly known as:  LOPRESSOR  Take 1 tablet (25 mg total) by mouth 2 (two) times daily.     polyethylene glycol packet  Commonly known as:  MIRALAX / GLYCOLAX  Take 17 g by mouth daily.     pramoxine 1 % foam  Commonly known as:  PROCTOFOAM  Place  rectally 3 (three) times daily as needed for itching, irritation or hemorrhoids.     SIMBRINZA 1-0.2 % Susp  Generic drug:  Brinzolamide-Brimonidine  Place 1 drop into the left eye 2 (two) times daily.     solifenacin 5 MG tablet  Commonly known as:  VESICARE  Take 5 mg by mouth daily.     ULTICARE INSULIN SYRINGE 30G X 1/2" 0.3 ML Misc  Generic drug:  Insulin Syringe-Needle U-100  USE ONE SYRINGE AT BEDTIME        Discharge Instructions: Please refer  to Patient Instructions section of EMR for full details.  Patient was counseled important signs and symptoms that should prompt return to medical care, changes in medications, dietary instructions, activity restrictions, and follow up appointments.   Follow-Up Appointments:   [name] Saa, MD 03/16/2015, 9:42 AM PGY-1, [facility]

## 2015-03-16 DIAGNOSIS — H04123 Dry eye syndrome of bilateral lacrimal glands: Secondary | ICD-10-CM | POA: Diagnosis not present

## 2015-03-16 DIAGNOSIS — N312 Flaccid neuropathic bladder, not elsewhere classified: Secondary | ICD-10-CM | POA: Diagnosis not present

## 2015-03-16 DIAGNOSIS — M6248 Contracture of muscle, other site: Secondary | ICD-10-CM | POA: Diagnosis not present

## 2015-03-16 DIAGNOSIS — Z89511 Acquired absence of right leg below knee: Secondary | ICD-10-CM | POA: Diagnosis not present

## 2015-03-16 DIAGNOSIS — I770 Arteriovenous fistula, acquired: Secondary | ICD-10-CM | POA: Diagnosis not present

## 2015-03-16 DIAGNOSIS — D508 Other iron deficiency anemias: Secondary | ICD-10-CM | POA: Diagnosis not present

## 2015-03-16 DIAGNOSIS — N2581 Secondary hyperparathyroidism of renal origin: Secondary | ICD-10-CM | POA: Diagnosis not present

## 2015-03-16 DIAGNOSIS — E1139 Type 2 diabetes mellitus with other diabetic ophthalmic complication: Secondary | ICD-10-CM | POA: Diagnosis not present

## 2015-03-16 DIAGNOSIS — H6121 Impacted cerumen, right ear: Secondary | ICD-10-CM | POA: Diagnosis not present

## 2015-03-16 DIAGNOSIS — R05 Cough: Secondary | ICD-10-CM | POA: Diagnosis not present

## 2015-03-16 DIAGNOSIS — N184 Chronic kidney disease, stage 4 (severe): Secondary | ICD-10-CM | POA: Diagnosis not present

## 2015-03-16 DIAGNOSIS — E113512 Type 2 diabetes mellitus with proliferative diabetic retinopathy with macular edema, left eye: Secondary | ICD-10-CM | POA: Diagnosis not present

## 2015-03-16 DIAGNOSIS — N179 Acute kidney failure, unspecified: Secondary | ICD-10-CM | POA: Diagnosis not present

## 2015-03-16 DIAGNOSIS — K611 Rectal abscess: Secondary | ICD-10-CM | POA: Diagnosis not present

## 2015-03-16 DIAGNOSIS — I110 Hypertensive heart disease with heart failure: Secondary | ICD-10-CM | POA: Diagnosis not present

## 2015-03-16 DIAGNOSIS — I10 Essential (primary) hypertension: Secondary | ICD-10-CM | POA: Diagnosis not present

## 2015-03-16 DIAGNOSIS — Z79899 Other long term (current) drug therapy: Secondary | ICD-10-CM | POA: Diagnosis not present

## 2015-03-16 DIAGNOSIS — R531 Weakness: Secondary | ICD-10-CM | POA: Diagnosis not present

## 2015-03-16 DIAGNOSIS — K649 Unspecified hemorrhoids: Secondary | ICD-10-CM | POA: Diagnosis not present

## 2015-03-16 DIAGNOSIS — E113599 Type 2 diabetes mellitus with proliferative diabetic retinopathy without macular edema, unspecified eye: Secondary | ICD-10-CM | POA: Diagnosis not present

## 2015-03-16 DIAGNOSIS — E860 Dehydration: Secondary | ICD-10-CM | POA: Diagnosis not present

## 2015-03-16 DIAGNOSIS — N181 Chronic kidney disease, stage 1: Secondary | ICD-10-CM | POA: Diagnosis not present

## 2015-03-16 DIAGNOSIS — G629 Polyneuropathy, unspecified: Secondary | ICD-10-CM | POA: Diagnosis not present

## 2015-03-16 DIAGNOSIS — F418 Other specified anxiety disorders: Secondary | ICD-10-CM | POA: Diagnosis not present

## 2015-03-16 DIAGNOSIS — K648 Other hemorrhoids: Secondary | ICD-10-CM | POA: Diagnosis not present

## 2015-03-16 DIAGNOSIS — E1140 Type 2 diabetes mellitus with diabetic neuropathy, unspecified: Secondary | ICD-10-CM | POA: Diagnosis not present

## 2015-03-16 DIAGNOSIS — M4722 Other spondylosis with radiculopathy, cervical region: Secondary | ICD-10-CM | POA: Diagnosis not present

## 2015-03-16 DIAGNOSIS — R5381 Other malaise: Secondary | ICD-10-CM | POA: Diagnosis not present

## 2015-03-16 DIAGNOSIS — H918X1 Other specified hearing loss, right ear: Secondary | ICD-10-CM | POA: Diagnosis not present

## 2015-03-16 DIAGNOSIS — E1129 Type 2 diabetes mellitus with other diabetic kidney complication: Secondary | ICD-10-CM | POA: Diagnosis not present

## 2015-03-16 DIAGNOSIS — J811 Chronic pulmonary edema: Secondary | ICD-10-CM | POA: Diagnosis not present

## 2015-03-16 DIAGNOSIS — M79641 Pain in right hand: Secondary | ICD-10-CM | POA: Diagnosis not present

## 2015-03-16 DIAGNOSIS — D631 Anemia in chronic kidney disease: Secondary | ICD-10-CM | POA: Diagnosis not present

## 2015-03-16 DIAGNOSIS — N189 Chronic kidney disease, unspecified: Secondary | ICD-10-CM | POA: Diagnosis not present

## 2015-03-16 LAB — GLUCOSE, CAPILLARY
GLUCOSE-CAPILLARY: 226 mg/dL — AB (ref 65–99)
GLUCOSE-CAPILLARY: 219 mg/dL — AB (ref 65–99)

## 2015-03-16 NOTE — Progress Notes (Signed)
CSW waiting on return phone call re: pt's d/c to [facility] today.  CSW will continue to follow.  Pollyann Savoy, LCSW Weekend [employer] [phone]

## 2015-03-16 NOTE — Progress Notes (Signed)
Non emergent ambulance arranged on behalf of pt to [facility].  Pt agreeable to d/c arrangements.  Attempted to call pt's sister, per pt request, but she did not answer (either home or mobile number) and no VM.  Pollyann Savoy, LCSW Weekend Coverage [phone]

## 2015-03-16 NOTE — Discharge Instructions (Signed)
We discontinued your Lasix during admission. We started Norvasc (amlodipine) instead. Please continue to take Norvasc  every day for high blood pressure.   For hemorrhoid pain, you can continue Proctofoam and Anusol.   For the pain in your hand, continue taking gabapentin 100 mg three times a day. You can also continue to take Norco twice a day for pain as needed.   Be sure to take Miralax and Colace daily to prevent constipation, as this could irritate your hemorrhoids and increase the pain.

## 2015-03-17 ENCOUNTER — Other Ambulatory Visit: Payer: Self-pay

## 2015-03-17 NOTE — Patient Outreach (Signed)
Patient is currently a resident of [facility].   Will follow up with him in 2 weeks regarding transition of care.    Plan: Telephone contact on 2/20

## 2015-03-17 NOTE — Consult Note (Signed)
Noted patient went to [facility] and not home at discharge. Will not request assign to [facility] Care Management program at this time. Will alert [facility] Care Management office.  Raiford Noble, MSN-Ed, RN,BSN [facility] Liaison [phone]

## 2015-03-18 ENCOUNTER — Non-Acute Institutional Stay (SKILLED_NURSING_FACILITY): Payer: Medicare Other | Admitting: Internal Medicine

## 2015-03-18 ENCOUNTER — Encounter: Payer: Self-pay | Admitting: Internal Medicine

## 2015-03-18 DIAGNOSIS — E871 Hypo-osmolality and hyponatremia: Secondary | ICD-10-CM | POA: Diagnosis not present

## 2015-03-18 DIAGNOSIS — IMO0001 Reserved for inherently not codable concepts without codable children: Secondary | ICD-10-CM

## 2015-03-18 DIAGNOSIS — K648 Other hemorrhoids: Secondary | ICD-10-CM | POA: Diagnosis not present

## 2015-03-18 DIAGNOSIS — N181 Chronic kidney disease, stage 1: Secondary | ICD-10-CM

## 2015-03-18 DIAGNOSIS — D72829 Elevated white blood cell count, unspecified: Secondary | ICD-10-CM

## 2015-03-18 DIAGNOSIS — K644 Residual hemorrhoidal skin tags: Secondary | ICD-10-CM

## 2015-03-18 DIAGNOSIS — E46 Unspecified protein-calorie malnutrition: Secondary | ICD-10-CM | POA: Diagnosis not present

## 2015-03-18 DIAGNOSIS — M4722 Other spondylosis with radiculopathy, cervical region: Secondary | ICD-10-CM | POA: Diagnosis not present

## 2015-03-18 DIAGNOSIS — Z794 Long term (current) use of insulin: Secondary | ICD-10-CM

## 2015-03-18 DIAGNOSIS — N2581 Secondary hyperparathyroidism of renal origin: Secondary | ICD-10-CM

## 2015-03-18 DIAGNOSIS — I10 Essential (primary) hypertension: Secondary | ICD-10-CM | POA: Diagnosis not present

## 2015-03-18 DIAGNOSIS — N189 Chronic kidney disease, unspecified: Secondary | ICD-10-CM

## 2015-03-18 DIAGNOSIS — E119 Type 2 diabetes mellitus without complications: Secondary | ICD-10-CM

## 2015-03-18 DIAGNOSIS — K5909 Other constipation: Secondary | ICD-10-CM

## 2015-03-18 DIAGNOSIS — K5900 Constipation, unspecified: Secondary | ICD-10-CM

## 2015-03-18 DIAGNOSIS — R5381 Other malaise: Secondary | ICD-10-CM

## 2015-03-18 DIAGNOSIS — I739 Peripheral vascular disease, unspecified: Secondary | ICD-10-CM | POA: Diagnosis not present

## 2015-03-18 DIAGNOSIS — Z9359 Other cystostomy status: Secondary | ICD-10-CM

## 2015-03-18 DIAGNOSIS — D631 Anemia in chronic kidney disease: Secondary | ICD-10-CM

## 2015-03-18 NOTE — Progress Notes (Signed)
Patient ID: Lawrence [name], male   DOB: 1961/01/02, 55 y.o.   MRN: [id number]    LOCATION: [facility]  PCP: [name], MD   Code Status: Full Code  Goals of care: Advanced Directive information Advanced Directives 03/11/2015  Does patient have an advance directive? No  Would patient like information on creating an advanced directive? No - patient declined information  Pre-existing out of facility DNR order (yellow form or pink MOST form) -       Extended Emergency Contact Information Primary Emergency Contact: Faith Rogue [place] Phone: [phone] Relation: Friend Secondary Emergency Contact: Calhoun,Susie Address: [address]          [facility] [place], [place] [address] [place] of [place] Home Phone: [phone] Mobile Phone: [phone] Relation: Sister   No Known Allergies  Chief Complaint  Patient presents with  . New Admit To SNF    New Admission     HPI:  Patient is a 55 y.o. male seen today for short term rehabilitation post hospital admission from 03/11/15-03/16/15 with acute on chronic renal failure with elevated creatinine of 4.5. He improved with iv fluids. He also had right hand weakness and pain. Mri of head and spine were obtained. No acute infarct, hydrocephalus was noted but neurosurgery did not think this was NPH. Mri spine showed cervical spondylosis and stenosis. He was also noted ot have hemorrhoids and surgery was consulted but non surgical intervention with anusol cream, proctofoam and warm compress were recommended. He is seen in his room today. He has PMH of CKD, DM, HTN, diastolic heart failure among others. He complaints of pain in his rectal area and in his right arm and hand. He had bowel movement a week back and says this is normal for him and would not like to be started on laxative. He has suprapubic catheter.   Review of Systems:  Constitutional: Negative for fever, chills HENT: Negative for headache,  congestion, nasal discharge Eyes: Negative for blurred vision, double vision and discharge.  Respiratory: Negative for cough, shortness of breath and wheezing.   Cardiovascular: Negative for chest pain, palpitations, leg swelling.  Gastrointestinal: Negative for heartburn, nausea, vomiting, abdominal pain. Genitourinary: has suprapubic catheter Musculoskeletal: Negative for back pain, fall Skin: Negative for itching, rash.  Neurological: Negative for dizziness Psychiatric/Behavioral: Negative for depression   Past Medical History  Diagnosis Date  . Hypertension   . Chronic kidney disease   . Hypercholesteremia   . PONV (postoperative nausea and vomiting)   . Peripheral vascular disease (HCC)   . H/O hiatal hernia     hx of years ago   . Pneumonia 10/23/2013  . IDDM (insulin dependent diabetes mellitus) (HCC)   . Depression 08/2012    "just when foot first got cut off"   Past Surgical History  Procedure Laterality Date  . Amputation Right 05/03/2012    Procedure: Right First Ray AMPUTATION;  Surgeon: Eldred Manges, MD;  Location: [facility] OR;  Service: Orthopedics;  Laterality: Right;  . I&d extremity Right 05/29/2012    Procedure: IRRIGATION AND DEBRIDEMENT EXTREMITY- right foot;  Surgeon: Eldred Manges, MD;  Location: [facility] OR;  Service: Orthopedics;  Laterality: Right;  Right Foot Debridement, VAC Application  . Amputation Right 08/16/2012    Procedure: AMPUTATION BELOW KNEE;  Surgeon: Eldred Manges, MD;  Location: [facility] OR;  Service: Orthopedics;  Laterality: Right;  Right Below Knee Amputation  . Amputation Right 10/04/2012    Procedure: AMPUTATION BELOW KNEE  REVISION;  Surgeon: Eldred Manges, MD;  Location: [facility] OR;  Service: Orthopedics;  Laterality: Right;  . Insertion of suprapubic catheter N/A 03/02/2013    Procedure: INSERTION OF SUPRAPUBIC CATHETER;  Surgeon: Su Grand, MD;  Location: [facility] ORS;  Service: Urology;  Laterality: N/A;  . Tonsillectomy  ~ 1967  . Av fistula placement Right 11/01/2013     Procedure: RIGHT ARM ARTERIOVENOUS (AV) FISTULA CREATION;  Surgeon: Chuck Hint, MD;  Location: [facility] OR;  Service: Vascular;  Laterality: Right;  . Abdominal aortagram N/A 05/03/2012    Procedure: ABDOMINAL Ronny Flurry;  Surgeon: Sherren Kerns, MD;  Location: [name] CATH LAB;  Service: Cardiovascular;  Laterality: N/A;  . Lower extremity angiogram Right 05/03/2012    Procedure: LOWER EXTREMITY ANGIOGRAM;  Surgeon: Sherren Kerns, MD;  Location: [facility] CATH LAB;  Service: Cardiovascular;  Laterality: Right;  rt leg angio   Social History:   reports that he has never smoked. He has never used smokeless tobacco. He reports that he drinks alcohol. He reports that he does not use illicit drugs.  History reviewed. No pertinent family history.  Medications:   Medication List       This list is accurate as of: 03/18/15  9:27 AM.  Always use your most recent med list.               amLODipine 5 MG tablet  Commonly known as:  NORVASC  Take 1 tablet (5 mg total) by mouth daily.     aspirin 81 MG tablet  Take 81 mg by mouth daily.     atorvastatin 40 MG tablet  Commonly known as:  LIPITOR  Take 1 tablet (40 mg total) by mouth daily.     calcitRIOL 0.25 MCG capsule  Commonly known as:  ROCALTROL  Take 1 capsule (0.25 mcg total) by mouth daily.     docusate sodium 100 MG capsule  Commonly known as:  COLACE  Take 1 capsule (100 mg total) by mouth 2 (two) times daily.     EASY TOUCH PEN NEEDLES 31G X 8 MM Misc  Generic drug:  Insulin Pen Needle  USE AS DIRECTED TO INJECT INSULIN.     gabapentin 100 MG capsule  Commonly known as:  NEURONTIN  Take 1 capsule (100 mg total) by mouth 3 (three) times daily.     HYDROcodone-acetaminophen 5-325 MG tablet  Commonly known as:  NORCO/VICODIN  Take 1-2 tablets by mouth every 4 (four) hours as needed.     hydrocortisone 2.5 % rectal cream  Commonly known as:  ANUSOL-HC  Apply rectally 2 times daily     Insulin Glargine 100 UNIT/ML  Solostar Pen  Commonly known as:  LANTUS SOLOSTAR  Inject 20 Units into the skin daily at 10 pm.     metoprolol tartrate 25 MG tablet  Commonly known as:  LOPRESSOR  Take 1 tablet (25 mg total) by mouth 2 (two) times daily.     polyethylene glycol packet  Commonly known as:  MIRALAX / GLYCOLAX  Take 17 g by mouth daily.     pramoxine 1 % foam  Commonly known as:  PROCTOFOAM  Place rectally 3 (three) times daily as needed for itching, irritation or hemorrhoids.     SIMBRINZA 1-0.2 % Susp  Generic drug:  Brinzolamide-Brimonidine  Place 1 drop into the left eye 2 (two) times daily.     solifenacin 5 MG tablet  Commonly known as:  VESICARE  Take 5 mg by mouth  daily.     ULTICARE INSULIN SYRINGE 30G X 1/2" 0.3 ML Misc  Generic drug:  Insulin Syringe-Needle U-100  USE ONE SYRINGE AT BEDTIME        Immunizations: Immunization History  Administered Date(s) Administered  . Influenza Split 02/19/2011  . Influenza,inj,Quad PF,36+ Mos 11/27/2013  . PPD Test 03/16/2015  . Pneumococcal Polysaccharide-23 10/25/2013  . Td 12/26/2007  . Tdap 05/03/2012     Physical Exam: Filed Vitals:   03/18/15 0918  BP: 152/63  Pulse: 79  Temp: 98.6 F (37 C)  TempSrc: Oral  Resp: 18  SpO2: 95%   General- adult male, central obesity with atrophied arms and legs, in no acute distress Head- normocephalic, atraumatic Nose- no maxillary or frontal sinus tenderness, no nasal discharge Throat- moist mucus membrane Eyes- PERRLA, EOMI, no pallor, no icterus, no discharge, normal conjunctiva, normal sclera Neck- no cervical lymphadenopathy Cardiovascular- normal s1,s2, no murmurs, no leg edema Respiratory- bilateral clear to auscultation, no wheeze, no rhonchi, no crackles, no use of accessory muscles Abdomen- bowel sounds present, soft, non tender, suprapubic catheter in place with site clean  Rectum- non thrombosed external hemorrhoid 2 noted, no bleeding, anal fissure +, ulceration of one of  the external hemorrhoids which is tender to touch Musculoskeletal- right BKA, able to move all 3 extremities, atrophied arm and leg muscles, cervical spine tenderness +, limited right arm range of motion with pain Neurological- alert and oriented to person, place and time Skin- warm and dry Psychiatry- normal mood and affect    Labs reviewed: Basic Metabolic Panel:  Recent Labs  16/10/96 1051 11/19/14 1045  03/13/15 1144 03/14/15 0530 03/15/15 03/15/15 0900  NA 140 136  < > 133* 133* 134* 134*  K 3.9 4.1  < > 3.1* 4.0 3.8 3.8  CL 105 102  < > 95* 96*  --  96*  CO2 25 24  < > 23 25  --  24  GLUCOSE 247* 281*  < > 236* 284*  --  264*  BUN 75* 87*  < > 75* 84* 93* 93*  CREATININE 2.73* 2.66*  < > 3.68* 3.74* 3.4* 3.38*  CALCIUM 9.4 9.3  < > 7.8* 8.3*  --  8.8*  PHOS 5.6* 5.8*  --   --   --   --   --   < > = values in this interval not displayed. Liver Function Tests:  Recent Labs  09/09/14 1051 11/19/14 1045 03/11/15 1400  AST  --   --  28  ALT  --   --  19  ALKPHOS  --   --  164*  BILITOT  --   --  1.1  PROT  --   --  6.8  ALBUMIN 3.1* 3.3* 2.4*   No results for input(s): LIPASE, AMYLASE in the last 8760 hours. No results for input(s): AMMONIA in the last 8760 hours. CBC:  Recent Labs  05/05/14 0248  03/07/15 1852 03/11/15 1400 03/11/15 1409 03/11/15 2126 03/12/15 03/12/15 0502  WBC 10.9*  --  14.1* 17.1*  --  15.1* 13.3 13.3*  NEUTROABS 9.1*  --  11.9* 14.5*  --   --   --   --   HGB 9.3*  < > 10.6* 9.8* 11.9* 9.6*  --  9.2*  HCT 27.5*  --  32.0* 30.0* 35.0* 29.5*  --  27.5*  MCV 86.5  --  83.8 82.9  --  82.6  --  82.8  PLT 123*  --  144*  214  --  218  --  214  < > = values in this interval not displayed. Cardiac Enzymes:  Recent Labs  03/14/15 1530  CKTOTAL 73   BNP: Invalid input(s): POCBNP CBG:  Recent Labs  03/15/15 2042 03/16/15 0830 03/16/15 1153  GLUCAP 253* 226* 219*    Radiological Exams: Mr Brain Wo Contrast  03/14/2015   CLINICAL DATA:  55 year old male with bilateral lower extremity weakness, right upper extremity weakness. Initial encounter. EXAM: MRI HEAD WITHOUT CONTRAST TECHNIQUE: Multiplanar, multiecho pulse sequences of the brain and surrounding structures were obtained without intravenous contrast. COMPARISON:  Cervical spine MRI from today reported separately. FINDINGS: Mild to moderate ventriculomegaly, all ventricles affected. No evidence of transependymal edema. No strong evidence of aqueductal stenosis, and furthermore axial T2 images suggest hyperdynamic flow in the cerebral aqueduct (series 6, image 8). There is superimposed generalized decreased for age cerebral volume. No focal encephalomalacia identified. Wallace Cullens and white matter signal is within normal limits for age throughout the brain. No chronic cerebral blood products. No restricted diffusion to suggest acute infarction. No midline shift, mass effect, evidence of mass lesion, extra-axial collection or acute intracranial hemorrhage. Cervicomedullary junction and pituitary are within normal limits. Major intracranial vascular flow voids are preserved. Chronic mastoid sclerosis appears stable since 2009. Both tympanic cavities also appear chronically opacified. Other Visible internal auditory structures appear normal. Mild paranasal sinus mucosal thickening. Postoperative changes to both globes. Negative scalp soft tissues. Normal bone marrow signal. IMPRESSION: 1. Mild to moderate degree of diffuse ventriculomegaly. Differential considerations include Chronic Communicating Hydrocephalus, Normal Pressure Hydrocephalus, and less likely Ex Vacuo enlargement (but see also #2). 2. Cerebral volume does appear diminished for age in a generalized fashion. No focal encephalomalacia, and overall preserved gray and white matter signal. 3. 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 Electronically Signed   By: Odessa Fleming M.D.   On: 03/14/2015 20:36   Mr Cervical  Spine Wo Contrast  03/14/2015  CLINICAL DATA:  55 year old male with bilateral lower extremity weakness, right upper extremity weakness. Initial encounter. EXAM: MRI CERVICAL SPINE WITHOUT CONTRAST TECHNIQUE: Multiplanar, multisequence MR imaging of the cervical spine was performed. No intravenous contrast was administered. COMPARISON:  Neck CT 07/29/2007. FINDINGS: Large body habitus. Preserved cervical lordosis. Bulky anterior C1-C2 osteophytosis better demonstrated on the 2009 CT. Trace associated prevertebral fluid about the C2 osteophyte, most likely degenerative in nature. No marrow edema or evidence of acute osseous abnormality. Suggestion of colpocephaly, incompletely evaluated. Visualized posterior fossa structures appear normal. Cervicomedullary junction is within normal limits. No signal abnormality in the visualized spinal cord. Large body habitus, mild subcutaneous edema posteriorly, a no definite acute paraspinal soft tissue abnormality. Partially visible bilateral sternoclavicular joint degeneration with joint effusions. 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 The following superimposed degenerative changes are noted: C2-C3:  Negative. C3-C4: Mild disc bulge. Borderline to mild spinal stenosis. Uncovertebral hypertrophy with moderate right C4 foraminal stenosis. C4-C5: Mild disc bulge. Spinal stenosis with no definite spinal cord mass effect. Uncovertebral hypertrophy with mild to moderate bilateral C5 foraminal stenosis. C5-C6: Broad-based posterior disc osteophyte complex best seen on series 6, image 18. Moderate ligament flavum hypertrophy. Spinal stenosis with mild to moderate spinal cord mass effect. Uncovertebral hypertrophy, but only mild C6 foraminal stenosis. C6-C7: Right eccentric broad-based posterior disc bulge. Mild to moderate ligament flavum hypertrophy. Spinal stenosis with up to mild spinal cord mass effect. Uncovertebral hypertrophy with moderate to severe right C7  foraminal stenosis. C7-T1:  Mild  facet hypertrophy. No upper thoracic spinal stenosis. IMPRESSION: 1. 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 No associated cord signal abnormality. 2. Lesser cervical spinal stenosis otherwise from C3-C4 to C6-C7. 3. Degenerative moderate or severe neural foraminal stenosis at the right C4 and right C7 nerve levels. 4. Chronic bulky osteophytosis at the anterior C1-C2 articulation with trace degenerative appearing prevertebral fluid there. No acute osseous abnormality identified. Electronically Signed   By: Odessa Fleming M.D.   On: 03/14/2015 20:18   Dg Abd Acute W/chest  03/07/2015  CLINICAL DATA:  Constipation.  Dehydration.  Anorexia for 1 week. EXAM: DG ABDOMEN ACUTE W/ 1V CHEST COMPARISON:  Chest radiograph on 05/05/2014 FINDINGS: There is no evidence of dilated bowel loops. Moderate amount stool is seen mainly within the transverse and descending colon. No evidence of free intraperitoneal air. Low lung volumes are noted. Heart size is within normal limits. Both lungs are clear. No evidence of pneumothorax or pleural effusion . IMPRESSION: No acute findings.  Moderate stool burden. Electronically Signed   By: Myles Rosenthal M.D.   On: 03/07/2015 21:26   US Abdomen Limited Ruq  03/14/2015  CLINICAL DATA:  Increased alkaline phosphatase EXAM: US ABDOMEN LIMITED - RIGHT UPPER QUADRANT COMPARISON:  None. FINDINGS: Gallbladder: No gallstones or wall thickening. There is a hyperechoic intraluminal object along the dependent wall representing either a small polyp or focal sludge. It is 1 cm in size. Common bile duct: Diameter: 5 mm. Liver: There is limited visualization of the right lobe due to body habitus. No obvious focal mass. Echogenicity is grossly within normal limits. IMPRESSION: Limited visualization of the liver.  No obvious mass. 1 cm polyp versus sludge ball in the gallbladder. This carries a small  risk of carcinoma. Surgical consultation is recommended. At a minimum, annual ultrasound follow-up is recommended. Electronically Signed   By: Jolaine Click M.D.   On: 03/14/2015 08:05    Assessment/Plan  Physical deconditioning Will have him work with physical therapy and occupational therapy team to help with gait training and muscle strengthening exercises.fall precautions. Skin care. Encourage to be out of bed.   External hemorrhoids Continue anusol rectal cream bid and proctofoam for now with warm compresses and monitor  Ulceration of external hemorrhoid With pain +. Start doxycycline 100 mg bid for now for a week and get appointment with general surgery to evaluate further. He also has anal fissure. Will have surgery team evaluate further for fistula as well.  Cervical spondylosis with radiculopathy Continues to have pain with current pain regimen helping some. Continue noroc 5-325 mg 1-2 tab q4h prn pain and gabapentin 100 mg tid for now. Get neurosurgery referral. To work with therapy team  Chronic constipation Currently on colace 100 mg bid and daily miralax. Change colace to senokot s 2 tab qhs and reassess. Hydration to be maintained  Hyponatremia With dehydration, maintain po fluid intake and monitor bmp  Leukocytosis With new ulcer noted on his rectal area, start antibiotic as above and monitor wbc and temp curve  Anemia of chronic disease Monitor cbc  HTN Stable, monitor bp, continue norvasc 5 mg daily and lopressor 25 mg bid and monitor BP  ckd  Monitor bmp  Secondary hyperparathyroidism With ckd. Continue calcitriol  IDDM with renal manifestation Continue to monitor cbg. Continue lantus 20 u daily with aspirin and statin  PVD S/p right BKA. Continue aspirin and statin  Suprapubic catheter With hx of  prostate problem. Continue skin and suprapubic catheter care  Protein calorie malnutrition Monitor po intake and get dietary consult with his  hypoalbuminemia  Goals of care: short term rehabilitation   Labs/tests ordered: cbc with diff, cmp  Family/ staff Communication: reviewed care plan with patient and nursing supervisor    Oneal Grout, MD Internal Medicine [facility] [address] [place], [place] [address] Cell Phone (Monday-Friday 8 am - 5 pm): [phone] On Call: [phone] and follow prompts after 5 pm and on weekends Office Phone: [phone] Office Fax: [phone]

## 2015-03-20 DIAGNOSIS — K611 Rectal abscess: Secondary | ICD-10-CM | POA: Diagnosis not present

## 2015-03-24 LAB — CBC AND DIFFERENTIAL
HCT: 29 % — AB (ref 41–53)
HEMOGLOBIN: 9.1 g/dL — AB (ref 13.5–17.5)
Platelets: 221 10*3/uL (ref 150–399)
WBC: 7.1 10^3/mL

## 2015-03-25 ENCOUNTER — Other Ambulatory Visit: Payer: Self-pay | Admitting: Internal Medicine

## 2015-03-26 ENCOUNTER — Other Ambulatory Visit (HOSPITAL_COMMUNITY): Payer: Self-pay

## 2015-03-27 ENCOUNTER — Inpatient Hospital Stay (HOSPITAL_COMMUNITY): Admission: RE | Admit: 2015-03-27 | Payer: Medicare Other | Source: Ambulatory Visit

## 2015-03-31 ENCOUNTER — Other Ambulatory Visit: Payer: Self-pay | Admitting: *Deleted

## 2015-03-31 ENCOUNTER — Other Ambulatory Visit: Payer: Self-pay

## 2015-03-31 MED ORDER — HYDROCODONE-ACETAMINOPHEN 5-325 MG PO TABS: ORAL_TABLET | ORAL | Status: DC

## 2015-03-31 NOTE — Telephone Encounter (Signed)
[facility] 

## 2015-03-31 NOTE — Patient Outreach (Signed)
Unsuccessful attempt made to contact patient to follow up on skilled nursing facility discharge. Call made to [facility] [phone]), spoke with Harland German.   Lorene Dy advised this RNCM patient is still at facility, no discharge date has been established at this point.   Plan: Make telephone contact on Thursday, February 23 for discharge information.

## 2015-04-02 LAB — BASIC METABOLIC PANEL
BUN: 56 mg/dL — AB (ref 4–21)
CREATININE: 2.8 mg/dL — AB (ref 0.6–1.3)
GLUCOSE: 142 mg/dL
Potassium: 4.8 mmol/L (ref 3.4–5.3)
SODIUM: 135 mmol/L — AB (ref 137–147)

## 2015-04-03 ENCOUNTER — Encounter: Payer: Self-pay | Admitting: Neurology

## 2015-04-03 ENCOUNTER — Ambulatory Visit (INDEPENDENT_AMBULATORY_CARE_PROVIDER_SITE_OTHER): Payer: Medicare Other | Admitting: Neurology

## 2015-04-03 VITALS — BP 145/71 | HR 74

## 2015-04-03 DIAGNOSIS — M79641 Pain in right hand: Secondary | ICD-10-CM

## 2015-04-03 DIAGNOSIS — G629 Polyneuropathy, unspecified: Secondary | ICD-10-CM

## 2015-04-03 NOTE — Progress Notes (Signed)
PATIENT: Lawrence [name] DOB: [birth year]  Chief Complaint  Patient presents with  . Right Hand Pain    He is here with concerns of pain in his right upper extremity, primarily in his hand.  He is having difficulty opening and closing his hand.  He has a fistula in that arm. He was recently in the hospital for hemorrhoids and underwent MRI scans of his brain and cervical spine.  He is currently at [facility] for rehab for two weeks then will go home, where he lives alone.  He is here today on a stretcher but typically ambulates with a walker.     HISTORICAL  Lawrence [name] is a 55 years old right-handed male, currently at [facility] place, he was brought in by transportation, following his hospital discharge for evaluation of right arm weakness and pain  I reviewed and summarized his most recent hospital admission from January 30 till March 19 2015, he had more than 30 years history of insulin-dependent diabetes, hypertension, hyperlipidemia, peripheral vascular disease, had right below knee amputation in 2014, he was admitted to the hospital due to dehydration, generalized weakness, pain that is associated with hemorrhoid, prior to hospital admission, he lives with his uncle all his life, his uncle suffered mental illness, he has quit driving since his right leg amputation, ambulate with a walker or wheelchair at home  He had a chronic kidney disease, has received right arm AV fistular since 2015, but never had hemodialysis, since the surgery, he was noted to have gradually worsening right below elbow pain, sharp shooting involving all 5 fingers, also developed gradual onset right arm weakness, could no longer straight out his right hand, and also has gradual fixed contraction of his right elbow, there was no significant left arm numbness or weakness, he does has chronic neck pain, has left leg ascending paresthesia  During his hospital stay, he was treated with IV hydration, symptoms has much  improved,  I have personally reviewed MRI of the brain February 2017 there is mild to moderate diffuse ventriculomegaly, 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 No associated cord signal abnormality. Lesser cervical spinal stenosis otherwise from C3-C4 to C6-C7. Degenerative moderate or severe neural foraminal stenosis at the right C4 and right C7 nerve levels. Chronic bulky osteophytosis at the anterior C1-C2 articulation with trace degenerative appearing prevertebral fluid there. No acute osseous abnormality identified.  Patient reported that he was born with "big head ", denies significant gait difficulty per to his right leg amputations. On today's examination, he was find to have limited range of motion at his left elbow, mild underdevelopment bilateral hands  REVIEW OF SYSTEMS: Full 14 system review of systems performed and notable only for as above   ALLERGIES: No Known Allergies  HOME MEDICATIONS: Current Outpatient Prescriptions  Medication Sig Dispense Refill  . amLODipine (NORVASC) 5 MG tablet Take 1 tablet (5 mg total) by mouth daily. 60 tablet 0  . aspirin 81 MG tablet Take 81 mg by mouth daily.     Marland Kitchen atorvastatin (LIPITOR) 40 MG tablet Take 1 tablet (40 mg total) by mouth daily. 90 tablet 1  . AZOPT 1 % ophthalmic suspension     . Brinzolamide-Brimonidine (SIMBRINZA) 1-0.2 % SUSP Place 1 drop into the left eye 2 (  two) times daily.    . calcitRIOL (ROCALTROL) 0.25 MCG capsule Take 1 capsule (0.25 mcg total) by mouth daily. 30 capsule 0  . docusate sodium (COLACE) 100 MG capsule Take 1 capsule (100 mg total) by mouth 2 (two) times daily. 60 capsule 0  . EASY TOUCH PEN NEEDLES 31G X 8 MM MISC USE AS DIRECTED TO  INJECT INSULIN. 100 each 4  . gabapentin (NEURONTIN) 100 MG capsule Take 1 capsule by mouth 3  times daily 90 capsule 1  . HYDROcodone-acetaminophen (NORCO/VICODIN) 5-325 MG tablet Take one tablet by mouth every 4 hours as needed for moderate pain. Do not exceed 4gm of Tylenol in 24 hours 180 tablet 0  . hydrocortisone (ANUSOL-HC) 2.5 % rectal cream Apply rectally 2 times daily 28.35 g 0  . Insulin Glargine (LANTUS SOLOSTAR) 100 UNIT/ML Solostar Pen Inject 20 Units into the skin daily at 10 pm. 15 mL 11  . metoprolol tartrate (LOPRESSOR) 25 MG tablet Take 1 tablet (25 mg total) by mouth 2 (two) times daily. 180 tablet 1  . polyethylene glycol (MIRALAX / GLYCOLAX) packet Take 17 g by mouth daily. 14 each 0  . pramoxine (PROCTOFOAM) 1 % foam Place rectally 3 (three) times daily as needed for itching, irritation or hemorrhoids. 15 g 0  . solifenacin (VESICARE) 5 MG tablet Take 5 mg by mouth daily.    Marland Kitchen ULTICARE INSULIN SYRINGE 30G X 1/2" 0.3 ML MISC USE ONE SYRINGE AT BEDTIME 100 each 0   No current facility-administered medications for this visit.    PAST MEDICAL HISTORY: Past Medical History  Diagnosis Date  . Hypertension   . Chronic kidney disease   . Hypercholesteremia   . PONV (postoperative nausea and vomiting)   . Peripheral vascular disease (HCC)   . H/O hiatal hernia     hx of years ago   . Pneumonia 10/23/2013  . IDDM (insulin dependent diabetes mellitus) (HCC)   . Depression 08/2012    "just when foot first got cut off"  . Fistula     PAST SURGICAL HISTORY: Past Surgical History  Procedure Laterality Date  . Amputation Right 05/03/2012    Procedure: Right First Ray AMPUTATION;  Surgeon: Eldred Manges, MD;  Location: [facility] OR;  Service: Orthopedics;  Laterality: Right;  . I&d extremity Right 05/29/2012    Procedure: IRRIGATION AND DEBRIDEMENT EXTREMITY- right foot;  Surgeon: Eldred Manges, MD;  Location: [facility] OR;  Service: Orthopedics;  Laterality: Right;  Right Foot Debridement,  VAC Application  . Amputation Right 08/16/2012    Procedure: AMPUTATION BELOW KNEE;  Surgeon: Eldred Manges, MD;  Location[facility] OR;  Service: Orthopedics;  Laterality: Right;  Right Below Knee Amputation  . Amputation Right 10/04/2012    Procedure: AMPUTATION BELOW KNEE REVISION;  Surgeon: Eldred Manges, MD;  Location: [facility] OR;  Service: Orthopedics;  Laterality: Right;  . Insertion of suprapubic catheter N/A 03/02/2013    Procedure: INSERTION OF SUPRAPUBIC CATHETER;  Surgeon: Su Grand, MD;  Location: [facility] ORS;  Service: Urology;  Laterality: N/A;  . Tonsillectomy  ~ 1967  . Av fistula placement Right 11/01/2013    Procedure: RIGHT ARM ARTERIOVENOUS (AV) FISTULA CREATION;  Surgeon: Chuck Hint, MD;  Location: [facility] OR;  Service: Vascular;  Laterality: Right;  . Abdominal aortagram N/A 05/03/2012    Procedure: ABDOMINAL Ronny Flurry;  Surgeon: Sherren Kerns, MD;  Location: [facility] CATH LAB;  Service: Cardiovascular;  Laterality: N/A;  . Lower extremity angiogram Right 05/03/2012  Procedure: LOWER EXTREMITY ANGIOGRAM;  Surgeon: Sherren Kerns, MD;  Location: [facility] CATH LAB;  Service: Cardiovascular;  Laterality: Right;  rt leg angio    FAMILY HISTORY: Family History  Problem Relation Age of Onset  . Stroke Mother   . Cancer Father     Unsure of type    SOCIAL HISTORY:  Social History   Social History  . Marital Status: Single    Spouse Name: N/A  . Number of Children: 0  . Years of Education: HS   Occupational History  . Disabled    Social History Main Topics  . Smoking status: Never Smoker   . Smokeless tobacco: Never Used  . Alcohol Use: No  . Drug Use: No  . Sexual Activity: Not Currently   Other Topics Concern  . Not on file   Social History Narrative   Lives at home by himself. Cares for uncle with mental disabilities, about 15 years older.     Left-handed.   No caffeine use.        PHYSICAL EXAM   Filed Vitals:   04/03/15 1350  BP: 145/71  Pulse: 74    Not  recorded      There is no weight on file to calculate BMI.  PHYSICAL EXAMNIATION:  Gen: NAD, conversant, well nourised, obese, well groomed                     Cardiovascular: Regular rate rhythm, no peripheral edema, warm, nontender. Eyes: Conjunctivae clear without exudates or hemorrhage Neck: Supple, no carotid bruise. Pulmonary: Clear to auscultation bilaterally   NEUROLOGICAL EXAM:  MENTAL STATUS: lies at stretcher, able to provide reasonable medical history  Speech:    Speech is normal; fluent and spontaneous with normal comprehension.    CRANIAL NERVES: CN II: Visual fields are full to confrontation.  Pupils are round equal and briskly reactive to light. CN III, IV, VI: extraocular movement are normal. No ptosis. CN V: Facial sensation is intact to pinprick in all 3 divisions bilaterally. Corneal responses are intact.  CN VII: Face is symmetric with normal eye closure and smile. CN VIII: Hearing is normal to rubbing fingers CN IX, X: Palate elevates symmetrically. Phonation is normal. CN XI: Head turning and shoulder shrug are intact CN XII: Tongue is midline with normal movements and no atrophy.  MOTOR: He has right hand intrinsic muscle atrophy, right arm AV fistula or above the right elbow, he has limited range of motion of right elbow, maximum about 150, left elbow to 170, mild hypoplasia of left hand, no significant left proximal and distal arm weakness, he has moderate right finger abduction, grip, wrist flexion, extension weakness. He had right below-knee amputation,   REFLEXES: Reflexes are 2+ and symmetric at the biceps, triceps, knees, and ankles. Plantar responses are flexor.  SENSORY: Length dependent decreased to light touch, pinprick and vibratory sensation to left mid shin level  COORDINATION: Rapid alternating movements and fine finger movements are intact. There is no dysmetria on finger-to-nose and heel-knee-shin.     GAIT/STANCE: Deferred   DIAGNOSTIC DATA (LABS, IMAGING, TESTING) - I reviewed patient records, labs, notes, testing and imaging myself where available.   ASSESSMENT AND PLAN  Lawrence [name] is a 55 y.o. male   Diabetic peripheral neuropathy , insulin-dependent diabetes  evidence of hydrocephalus, Congenital cervical spinal canal stenosis, Right hand weakness, pain,   consistent with right upper extremity neuropathy versus right cervical radiculopathy  EMG nerve conduction study  Continue gabapentin 100 mg 3 times a day, dosage is limited because of his abnormal kidney function, GFR is 22   Levert Feinstein, M.D. Ph.D.  [facility] [address] [place], [place] [address] Ph: [phone] Fax: [phone]  CC: Referring Provider

## 2015-04-04 DIAGNOSIS — K611 Rectal abscess: Secondary | ICD-10-CM | POA: Diagnosis not present

## 2015-04-14 ENCOUNTER — Non-Acute Institutional Stay (SKILLED_NURSING_FACILITY): Payer: Medicare Other | Admitting: Family

## 2015-04-14 DIAGNOSIS — F418 Other specified anxiety disorders: Secondary | ICD-10-CM

## 2015-04-14 MED ORDER — SERTRALINE HCL 50 MG PO TABS: 25.0000 mg | ORAL_TABLET | Freq: Every day | ORAL | Status: DC

## 2015-04-14 NOTE — Progress Notes (Signed)
Patient ID: Lawrence [name], male   DOB: 1960/05/25, 55 y.o.   MRN: [id number]  Location:  [facility]   Place of Service:  SNF [phone]) Provider: Oneal Grout, MD   [name], MD  Patient Care Team: Tobey Grim, MD as PCP - General (Family Medicine) Lavone Orn, RN as [facility] Care Management  Extended Emergency Contact Information Primary Emergency Contact: Faith Rogue [place] of [place] Phone: [phone] Relation: Friend Secondary Emergency Contact: Calhoun,Susie Address: [address]          [facility] [place], [place] [address] [place] of [place] Home Phone: [phone] Mobile Phone: [phone] Relation: Sister  Code Status:  Full Code  Goals of care: Advanced Directive information Advanced Directives 03/11/2015  Does patient have an advance directive? No  Would patient like information on creating an advanced directive? No - patient declined information     Chief Complaint  Patient presents with  . Acute Visit    Depression    HPI:  Pt is a 55 y.o. male seen today at [facility] for an acute visit for evaluation of depression. He has a medical history of HTN, CKD, IDDM, Right BKA amongst others. He is seen in his room today per facility Nurse request. Facility staff reports patient often expresses thought of depression and cries in his room. Patient denies feeling depressed during the visit.   Past Medical History  Diagnosis Date  . Hypertension   . Chronic kidney disease   . Hypercholesteremia   . PONV (postoperative nausea and vomiting)   . Peripheral vascular disease (HCC)   . H/O hiatal hernia     hx of years ago   . Pneumonia 10/23/2013  . IDDM (insulin dependent diabetes mellitus) (HCC)   . Depression 08/2012    "just when foot first got cut off"  . Fistula    Past Surgical History  Procedure Laterality Date  . Amputation Right 05/03/2012    Procedure: Right First  Ray AMPUTATION;  Surgeon: Eldred Manges, MD;  Location: [facility] OR;  Service: Orthopedics;  Laterality: Right;  . I&d extremity Right 05/29/2012    Procedure: IRRIGATION AND DEBRIDEMENT EXTREMITY- right foot;  Surgeon: Eldred Manges, MD;  Location: [facility] OR;  Service: Orthopedics;  Laterality: Right;  Right Foot Debridement, VAC Application  . Amputation Right 08/16/2012    Procedure: AMPUTATION BELOW KNEE;  Surgeon: Eldred Manges, MD;  Location: [facility] OR;  Service: Orthopedics;  Laterality: Right;  Right Below Knee Amputation  . Amputation Right 10/04/2012    Procedure: AMPUTATION BELOW KNEE REVISION;  Surgeon: Eldred Manges, MD;  Location: [facility] OR;  Service: Orthopedics;  Laterality: Right;  . Insertion of suprapubic catheter N/A 03/02/2013    Procedure: INSERTION OF SUPRAPUBIC CATHETER;  Surgeon: Su Grand, MD;  Location: [facility] ORS;  Service: Urology;  Laterality: N/A;  . Tonsillectomy  ~ 1967  . Av fistula placement Right 11/01/2013    Procedure: RIGHT ARM ARTERIOVENOUS (AV) FISTULA CREATION;  Surgeon: Chuck Hint, MD;  Location: [facility] OR;  Service: Vascular;  Laterality: Right;  . Abdominal aortagram N/A 05/03/2012    Procedure: ABDOMINAL Ronny Flurry;  Surgeon: Sherren Kerns, MD;  Location: [facility] CATH LAB;  Service: Cardiovascular;  Laterality: N/A;  . Lower extremity angiogram Right 05/03/2012    Procedure: LOWER EXTREMITY ANGIOGRAM;  Surgeon: Sherren Kerns, MD;  Location: [facility] CATH LAB;  Service: Cardiovascular;  Laterality: Right;  rt leg  angio    No Known Allergies    Medication List       This list is accurate as of: 04/14/15  4:30 PM.  Always use your most recent med list.               amLODipine 5 MG tablet  Commonly known as:  NORVASC  Take 1 tablet (5 mg total) by mouth daily.     aspirin 81 MG tablet  Take 81 mg by mouth daily.     atorvastatin 40 MG tablet  Commonly known as:  LIPITOR  Take 1 tablet (40 mg total) by mouth daily.     AZOPT 1 % ophthalmic suspension  Generic drug:   brinzolamide     calcitRIOL 0.25 MCG capsule  Commonly known as:  ROCALTROL  Take 1 capsule (0.25 mcg total) by mouth daily.     docusate sodium 100 MG capsule  Commonly known as:  COLACE  Take 1 capsule (100 mg total) by mouth 2 (two) times daily.     EASY TOUCH PEN NEEDLES 31G X 8 MM Misc  Generic drug:  Insulin Pen Needle  USE AS DIRECTED TO INJECT INSULIN.     gabapentin 100 MG capsule  Commonly known as:  NEURONTIN  Take 1 capsule by mouth 3  times daily     HYDROcodone-acetaminophen 5-325 MG tablet  Commonly known as:  NORCO/VICODIN  Take one tablet by mouth every 4 hours as needed for moderate pain. Do not exceed 4gm of Tylenol in 24 hours     hydrocortisone 2.5 % rectal cream  Commonly known as:  ANUSOL-HC  Apply rectally 2 times daily     Insulin Glargine 100 UNIT/ML Solostar Pen  Commonly known as:  LANTUS SOLOSTAR  Inject 20 Units into the skin daily at 10 pm.     metoprolol tartrate 25 MG tablet  Commonly known as:  LOPRESSOR  Take 1 tablet (25 mg total) by mouth 2 (two) times daily.     polyethylene glycol packet  Commonly known as:  MIRALAX / GLYCOLAX  Take 17 g by mouth daily.     pramoxine 1 % foam  Commonly known as:  PROCTOFOAM  Place rectally 3 (three) times daily as needed for itching, irritation or hemorrhoids.     SIMBRINZA 1-0.2 % Susp  Generic drug:  Brinzolamide-Brimonidine  Place 1 drop into the left eye 2 (two) times daily.     solifenacin 5 MG tablet  Commonly known as:  VESICARE  Take 5 mg by mouth daily.     ULTICARE INSULIN SYRINGE 30G X 1/2" 0.3 ML Misc  Generic drug:  Insulin Syringe-Needle U-100  USE ONE SYRINGE AT BEDTIME        Review of Systems  Constitutional: Negative for fever, chills, diaphoresis, activity change, appetite change and fatigue.  HENT: Negative.   Eyes: Negative.   Respiratory: Negative.   Cardiovascular: Negative.   Gastrointestinal: Negative.   Musculoskeletal: Positive for gait problem.        Right BKA   Skin: Negative.   Neurological: Negative.   Psychiatric/Behavioral: Negative for suicidal ideas, hallucinations, behavioral problems, confusion, sleep disturbance, self-injury and agitation. The patient is nervous/anxious.     Immunization History  Administered Date(s) Administered  . Influenza Split 02/19/2011  . Influenza,inj,Quad PF,36+ Mos 11/27/2013  . PPD Test 03/16/2015  . Pneumococcal Polysaccharide-23 10/25/2013  . Td 12/26/2007  . Tdap 05/03/2012   Pertinent  Health Maintenance Due  Topic Date Due  .  FOOT EXAM  02/19/2012  . URINE MICROALBUMIN  02/19/2012  . INFLUENZA VACCINE  05/09/2015 (Originally 09/09/2014)  . OPHTHALMOLOGY EXAM  05/02/2015  . HEMOGLOBIN A1C  05/08/2015  . COLONOSCOPY  02/18/2021   Fall Risk  08/09/2014  Falls in the past year? No  Risk for fall due to : Impaired mobility   Functional Status Survey:    Filed Vitals:   04/14/15 1622  BP: 133/65  Pulse: 94  Temp: 98.7 F (37.1 C)  Resp: 16  Weight: 186 lb 3.2 oz (84.46 kg)  SpO2: 94%   Body mass index is 30.99 kg/(m^2). Physical Exam  Constitutional: He is oriented to person, place, and time. He appears well-developed and well-nourished. No distress.  HENT:  Head: Normocephalic.  Eyes: Conjunctivae and EOM are normal. Pupils are equal, round, and reactive to light. Right eye exhibits no discharge. Left eye exhibits no discharge. No scleral icterus.  Neck: Neck supple. No JVD present. No tracheal deviation present.  Cardiovascular: Normal rate, regular rhythm and normal heart sounds.  Exam reveals no gallop and no friction rub.   No murmur heard. Pulmonary/Chest: Effort normal and breath sounds normal. No respiratory distress. He has no wheezes. He has no rales.  Abdominal: Soft. Bowel sounds are normal. He exhibits no distension. There is no tenderness. There is no rebound.  Lymphadenopathy:    He has no cervical adenopathy.  Neurological: He is oriented to person, place, and  time.  Skin: Skin is warm and dry.  Psychiatric:  Anxious     Labs reviewed:  Recent Labs  09/09/14 1051 11/19/14 1045  03/13/15 1144 03/14/15 0530 03/15/15 03/15/15 0900  NA 140 136  < > 133* 133* 134* 134*  K 3.9 4.1  < > 3.1* 4.0 3.8 3.8  CL 105 102  < > 95* 96*  --  96*  CO2 25 24  < > 23 25  --  24  GLUCOSE 247* 281*  < > 236* 284*  --  264*  BUN 75* 87*  < > 75* 84* 93* 93*  CREATININE 2.73* 2.66*  < > 3.68* 3.74* 3.4* 3.38*  CALCIUM 9.4 9.3  < > 7.8* 8.3*  --  8.8*  PHOS 5.6* 5.8*  --   --   --   --   --   < > = values in this interval not displayed.  Recent Labs  09/09/14 1051 11/19/14 1045 03/11/15 1400  AST  --   --  28  ALT  --   --  19  ALKPHOS  --   --  164*  BILITOT  --   --  1.1  PROT  --   --  6.8  ALBUMIN 3.1* 3.3* 2.4*    Recent Labs  05/05/14 0248  03/07/15 1852 03/11/15 1400 03/11/15 1409 03/11/15 2126 03/12/15 03/12/15 0502  WBC 10.9*  --  14.1* 17.1*  --  15.1* 13.3 13.3*  NEUTROABS 9.1*  --  11.9* 14.5*  --   --   --   --   HGB 9.3*  < > 10.6* 9.8* 11.9* 9.6*  --  9.2*  HCT 27.5*  --  32.0* 30.0* 35.0* 29.5*  --  27.5*  MCV 86.5  --  83.8 82.9  --  82.6  --  82.8  PLT 123*  --  144* 214  --  218  --  214  < > = values in this interval not displayed. Lab Results  Component Value Date  TSH 1.796 03/14/2015   Lab Results  Component Value Date   HGBA1C 7.8 11/08/2014   Lab Results  Component Value Date   CHOL 126 06/13/2012   HDL 23* 06/13/2012   LDLCALC 60 06/13/2012   TRIG 216* 06/13/2012   CHOLHDL 5.5 06/13/2012    Significant Diagnostic Results in last 30 days:  No results found.  Assessment/Plan  Depression with anxiety Increased depression and emotional reports by facility staff though patient states not depressed during the visit. On exam findings noted to be anxious. Will initiate sertraline 25 mg Tablet once daily.Will Consult with IPC if needed.     Family/ staff Communication: Reviewed with patient and Nurse  supervisor.   Labs/tests ordered:  None

## 2015-04-15 ENCOUNTER — Other Ambulatory Visit: Payer: Self-pay | Admitting: Internal Medicine

## 2015-04-18 ENCOUNTER — Non-Acute Institutional Stay (SKILLED_NURSING_FACILITY): Payer: Medicare Other | Admitting: Family

## 2015-04-18 ENCOUNTER — Encounter: Payer: Self-pay | Admitting: Family

## 2015-04-18 DIAGNOSIS — H918X1 Other specified hearing loss, right ear: Secondary | ICD-10-CM | POA: Diagnosis not present

## 2015-04-18 DIAGNOSIS — H6121 Impacted cerumen, right ear: Secondary | ICD-10-CM | POA: Diagnosis not present

## 2015-04-18 DIAGNOSIS — R05 Cough: Secondary | ICD-10-CM | POA: Diagnosis not present

## 2015-04-18 DIAGNOSIS — H6120 Impacted cerumen, unspecified ear: Secondary | ICD-10-CM | POA: Insufficient documentation

## 2015-04-18 DIAGNOSIS — R059 Cough, unspecified: Secondary | ICD-10-CM

## 2015-04-18 NOTE — Progress Notes (Signed)
Patient ID: Lawrence [name], male   DOB: 1960/04/14, 55 y.o.   MRN: [id number]  Location:  [facility] Room Number: 407 P Place of Service:  SNF ([phone]) Provider:Mahima Glade Lloyd, MD   [name], MD  Patient Care Team: Tobey Grim, MD as PCP - General (Family Medicine) Lavone Orn, RN as [facility] Care Management  Extended Emergency Contact Information Primary Emergency Contact: Faith Rogue [place] of [place] Phone: [phone] Relation: Friend Secondary Emergency Contact: [name] Address: [address]          [facility] [place], [place] [address] [place] of [place] Home Phone: [phone] Mobile Phone: [phone] Relation: Sister  Code Status:  Full Code  Goals of care: Advanced Directive information Advanced Directives 04/18/2015  Does patient have an advance directive? No  Would patient like information on creating an advanced directive? No - patient declined information     Chief Complaint  Patient presents with  . Acute Visit    Cerumen Impaction    HPI:  Pt is a 55 y.o. male seen today at [facility]  for an acute visit for evaluation of decreased hearing. He has a medical history of HTN, CKD stage 4, Neuropathy, Type 2 DM, among others. He is seen today in his room. He states has had worsening hearing loss on right ear for the past few days. He denies any fever, chills, sneezing, sore throat or pain in the ears. Facility staff reports no new concerns.    Past Medical History  Diagnosis Date  . Hypertension   . Chronic kidney disease   . Hypercholesteremia   . PONV (postoperative nausea and vomiting)   . Peripheral vascular disease (HCC)   . H/O hiatal hernia     hx of years ago   . Pneumonia 10/23/2013  . IDDM (insulin dependent diabetes mellitus) (HCC)   . Depression 08/2012    "just when foot first got cut off"  . Fistula    Past Surgical History    Procedure Laterality Date  . Amputation Right 05/03/2012    Procedure: Right First Ray AMPUTATION;  Surgeon: Eldred Manges, MD;  Location: [facility] OR;  Service: Orthopedics;  Laterality: Right;  . I&d extremity Right 05/29/2012    Procedure: IRRIGATION AND DEBRIDEMENT EXTREMITY- right foot;  Surgeon: Eldred Manges, MD;  Location: [facility] OR;  Service: Orthopedics;  Laterality: Right;  Right Foot Debridement, VAC Application  . Amputation Right 08/16/2012    Procedure: AMPUTATION BELOW KNEE;  Surgeon: Eldred Manges, MD;  Location: [facility] OR;  Service: Orthopedics;  Laterality: Right;  Right Below Knee Amputation  . Amputation Right 10/04/2012    Procedure: AMPUTATION BELOW KNEE REVISION;  Surgeon: Eldred Manges, MD;  Location: [facility] OR;  Service: Orthopedics;  Laterality: Right;  . Insertion of suprapubic catheter N/A 03/02/2013    Procedure: INSERTION OF SUPRAPUBIC CATHETER;  Surgeon: Su Grand, MD;  Location: [facility] ORS;  Service: Urology;  Laterality: N/A;  . Tonsillectomy  ~ 1967  . Av fistula placement Right 11/01/2013    Procedure: RIGHT ARM ARTERIOVENOUS (AV) FISTULA CREATION;  Surgeon: Chuck Hint, MD;  Location: [facility] OR;  Service: Vascular;  Laterality: Right;  . Abdominal aortagram N/A 05/03/2012    Procedure: ABDOMINAL Ronny Flurry;  Surgeon: Sherren Kerns, MD;  Location: [facility] CATH LAB;  Service: Cardiovascular;  Laterality: N/A;  . Lower extremity angiogram Right 05/03/2012    Procedure: LOWER EXTREMITY ANGIOGRAM;  Surgeon: Sherren Kerns, MD;  Location: [facility] CATH LAB;  Service: Cardiovascular;  Laterality: Right;  rt leg angio    No Known Allergies    Medication List       This list is accurate as of: 04/18/15  1:24 PM.  Always use your most recent med list.               amLODipine 5 MG tablet  Commonly known as:  NORVASC  Take 1 tablet by mouth  daily     aspirin 81 MG tablet  Take 81 mg by mouth daily.     atorvastatin 40 MG tablet  Commonly known as:  LIPITOR  Take 1 tablet (40 mg  total) by mouth daily.     calcitRIOL 0.25 MCG capsule  Commonly known as:  ROCALTROL  Take 1 capsule (0.25 mcg total) by mouth daily.     EASY TOUCH PEN NEEDLES 31G X 8 MM Misc  Generic drug:  Insulin Pen Needle  USE AS DIRECTED TO INJECT INSULIN.     feeding supplement (PRO-STAT SUGAR FREE 64) Liqd  Take 30 mLs by mouth daily.     gabapentin 100 MG capsule  Commonly known as:  NEURONTIN  Take 1 capsule by mouth 3  times daily     HYDROcodone-acetaminophen 5-325 MG tablet  Commonly known as:  NORCO/VICODIN  Take one tablet by mouth every 4 hours as needed for moderate pain. Do not exceed 4gm of Tylenol in 24 hours     hydrocortisone 2.5 % rectal cream  Commonly known as:  ANUSOL-HC  Apply rectally 2 times daily     Insulin Glargine 100 UNIT/ML Solostar Pen  Commonly known as:  LANTUS SOLOSTAR  Inject 20 Units into the skin daily at 10 pm.     metoprolol tartrate 25 MG tablet  Commonly known as:  LOPRESSOR  Take 1 tablet (25 mg total) by mouth 2 (two) times daily.     polyethylene glycol packet  Commonly known as:  MIRALAX / GLYCOLAX  Take 17 g by mouth daily.     pramoxine 1 % foam  Commonly known as:  PROCTOFOAM  Place rectally 3 (three) times daily as needed for itching, irritation or hemorrhoids.     senna 8.6 MG tablet  Commonly known as:  SENOKOT  Take 2 tablets by mouth at bedtime.     sertraline 25 MG tablet  Commonly known as:  ZOLOFT  Take 25 mg by mouth daily.     SIMBRINZA 1-0.2 % Susp  Generic drug:  Brinzolamide-Brimonidine  Place 1 drop into the left eye 2 (two) times daily.     solifenacin 5 MG tablet  Commonly known as:  VESICARE  Take 5 mg by mouth daily.     TUSSIN COUGH DM PO  Take 10 mLs by mouth. Daily for 7 days. Start 04-15-15, End 04-22-15     ULTICARE INSULIN SYRINGE 30G X 1/2" 0.3 ML Misc  Generic drug:  Insulin Syringe-Needle U-100  USE ONE SYRINGE AT BEDTIME        Review of Systems  Constitutional: Negative for fever,  chills, activity change and fatigue.  HENT: Negative for congestion, ear discharge, ear pain, sinus pressure, sneezing, sore throat and tinnitus.   Eyes: Negative.   Respiratory: Positive for cough. Negative for chest tightness, shortness of breath and wheezing.   Cardiovascular: Negative.   Gastrointestinal: Negative.   Skin: Negative.   Neurological: Positive for headaches. Negative for dizziness, seizures and  light-headedness.  Psychiatric/Behavioral: Negative.     Immunization History  Administered Date(s) Administered  . Influenza Split 02/19/2011  . Influenza,inj,Quad PF,36+ Mos 11/27/2013  . PPD Test 03/16/2015, 03/30/2015  . Pneumococcal Polysaccharide-23 10/25/2013  . Td 12/26/2007  . Tdap 05/03/2012   Pertinent  Health Maintenance Due  Topic Date Due  . FOOT EXAM  02/19/2012  . URINE MICROALBUMIN  02/19/2012  . INFLUENZA VACCINE  05/09/2015 (Originally 09/09/2014)  . OPHTHALMOLOGY EXAM  05/02/2015  . HEMOGLOBIN A1C  05/08/2015  . COLONOSCOPY  02/18/2021   Fall Risk  08/09/2014  Falls in the past year? No  Risk for fall due to : Impaired mobility   Functional Status Survey:    Filed Vitals:   04/18/15 1220  BP: 118/57  Pulse: 73  Temp: 98.2 F (36.8 C)  TempSrc: Oral  Resp: 20  Height: 5\' 5"  (1.651 m)  Weight: 186 lb 3.2 oz (84.46 kg)  SpO2: 99%   Body mass index is 30.99 kg/(m^2). Physical Exam  Constitutional: He is oriented to person, place, and time. He appears well-developed and well-nourished. No distress.  HENT:  Head: Normocephalic.  Mouth/Throat: Oropharynx is clear and moist.  Right ear cerumen impaction unable to visualize TM. Left ear TM clear, ear canal small amounts of cerumen noted.   Eyes: Conjunctivae and EOM are normal. Pupils are equal, round, and reactive to light. Right eye exhibits no discharge. Left eye exhibits no discharge. No scleral icterus.  Neck: Normal range of motion. No JVD present. No thyromegaly present.  Cardiovascular:  Normal rate, regular rhythm, normal heart sounds and intact distal pulses.  Exam reveals no gallop and no friction rub.   No murmur heard. Pulmonary/Chest: Effort normal. No respiratory distress. He has no wheezes. He has no rales. He exhibits no tenderness.  Right upper lobe diminished breath sounds.   Abdominal: Soft. Bowel sounds are normal. He exhibits no distension and no mass. There is no tenderness. There is no rebound and no guarding.  Musculoskeletal: He exhibits no edema or tenderness.  Lymphadenopathy:    He has no cervical adenopathy.  Neurological: He is oriented to person, place, and time.  Skin: Skin is warm and dry. No rash noted. No erythema. No pallor.  Psychiatric: He has a normal mood and affect.    Labs reviewed:  Recent Labs  09/09/14 1051 11/19/14 1045  03/13/15 1144 03/14/15 0530 03/15/15 03/15/15 0900 04/02/15  NA 140 136  < > 133* 133* 134* 134* 135*  K 3.9 4.1  < > 3.1* 4.0 3.8 3.8 4.8  CL 105 102  < > 95* 96*  --  96*  --   CO2 25 24  < > 23 25  --  24  --   GLUCOSE 247* 281*  < > 236* 284*  --  264*  --   BUN 75* 87*  < > 75* 84* 93* 93* 56*  CREATININE 2.73* 2.66*  < > 3.68* 3.74* 3.4* 3.38* 2.8*  CALCIUM 9.4 9.3  < > 7.8* 8.3*  --  8.8*  --   PHOS 5.6* 5.8*  --   --   --   --   --   --   < > = values in this interval not displayed.  Recent Labs  09/09/14 1051 11/19/14 1045 03/11/15 1400  AST  --   --  28  ALT  --   --  19  ALKPHOS  --   --  164*  BILITOT  --   --  1.1  PROT  --   --  6.8  ALBUMIN 3.1* 3.3* 2.4*    Recent Labs  05/05/14 0248  03/07/15 1852 03/11/15 1400  03/11/15 2126 03/12/15 03/12/15 0502 03/24/15  WBC 10.9*  --  14.1* 17.1*  --  15.1* 13.3 13.3* 7.1  NEUTROABS 9.1*  --  11.9* 14.5*  --   --   --   --   --   HGB 9.3*  < > 10.6* 9.8*  < > 9.6*  --  9.2* 9.1*  HCT 27.5*  --  32.0* 30.0*  < > 29.5*  --  27.5* 29*  MCV 86.5  --  83.8 82.9  --  82.6  --  82.8  --   PLT 123*  --  144* 214  --  218  --  214 221  < >  = values in this interval not displayed. Lab Results  Component Value Date   TSH 1.796 03/14/2015   Lab Results  Component Value Date   HGBA1C 7.8 11/08/2014   Lab Results  Component Value Date   CHOL 126 06/13/2012   HDL 23* 06/13/2012   LDLCALC 60 06/13/2012   TRIG 216* 06/13/2012   CHOLHDL 5.5 06/13/2012    Significant Diagnostic Results in last 30 days:  No results found.  Assessment/Plan 1. Cerumen impaction, right Right ear cerumen impaction unable to visualize TM. No tenderness to touch or drainage noted. Debrox 6.5 % apply 5 drops to right ear daily x 4 days then facility staff to lavage with warm water.   2. Hearing loss of right ear due to cerumen impaction Cerumen impaction noted. Debrox 6.5 % ear drops followed by lavage. Monitor hearing. Will refer to ENT if no improvement after cerumen removal.  3. Cough  Afebrile. Non-productive in nature. Diminished right lung sounds on exam. Ordering portable CXR PA/Lat     Family/ staff Communication: Reviewed plan of care with facility Nurse supervisor.   Labs/tests ordered:  portable CXR PA/Lat

## 2015-04-22 ENCOUNTER — Encounter: Payer: Medicare Other | Admitting: Neurology

## 2015-04-22 ENCOUNTER — Telehealth: Payer: Self-pay | Admitting: *Deleted

## 2015-04-22 NOTE — Telephone Encounter (Signed)
No showed NCV/EMG appointment. 

## 2015-04-23 ENCOUNTER — Encounter: Payer: Self-pay | Admitting: Neurology

## 2015-04-23 DIAGNOSIS — N312 Flaccid neuropathic bladder, not elsewhere classified: Secondary | ICD-10-CM | POA: Diagnosis not present

## 2015-04-25 DIAGNOSIS — K611 Rectal abscess: Secondary | ICD-10-CM | POA: Diagnosis not present

## 2015-05-01 DIAGNOSIS — E113599 Type 2 diabetes mellitus with proliferative diabetic retinopathy without macular edema, unspecified eye: Secondary | ICD-10-CM | POA: Diagnosis not present

## 2015-05-01 DIAGNOSIS — E1139 Type 2 diabetes mellitus with other diabetic ophthalmic complication: Secondary | ICD-10-CM | POA: Diagnosis not present

## 2015-05-01 DIAGNOSIS — H04123 Dry eye syndrome of bilateral lacrimal glands: Secondary | ICD-10-CM | POA: Diagnosis not present

## 2015-05-01 DIAGNOSIS — E113512 Type 2 diabetes mellitus with proliferative diabetic retinopathy with macular edema, left eye: Secondary | ICD-10-CM | POA: Diagnosis not present

## 2015-05-02 DIAGNOSIS — D631 Anemia in chronic kidney disease: Secondary | ICD-10-CM | POA: Diagnosis not present

## 2015-05-02 DIAGNOSIS — N189 Chronic kidney disease, unspecified: Secondary | ICD-10-CM | POA: Diagnosis not present

## 2015-05-02 DIAGNOSIS — E1129 Type 2 diabetes mellitus with other diabetic kidney complication: Secondary | ICD-10-CM | POA: Diagnosis not present

## 2015-05-02 DIAGNOSIS — I770 Arteriovenous fistula, acquired: Secondary | ICD-10-CM | POA: Diagnosis not present

## 2015-05-02 DIAGNOSIS — N2581 Secondary hyperparathyroidism of renal origin: Secondary | ICD-10-CM | POA: Diagnosis not present

## 2015-05-05 ENCOUNTER — Non-Acute Institutional Stay (SKILLED_NURSING_FACILITY): Payer: Medicare Other | Admitting: Family

## 2015-05-05 DIAGNOSIS — G629 Polyneuropathy, unspecified: Secondary | ICD-10-CM | POA: Diagnosis not present

## 2015-05-05 DIAGNOSIS — F418 Other specified anxiety disorders: Secondary | ICD-10-CM | POA: Diagnosis not present

## 2015-05-05 DIAGNOSIS — K611 Rectal abscess: Secondary | ICD-10-CM | POA: Diagnosis not present

## 2015-05-05 DIAGNOSIS — I110 Hypertensive heart disease with heart failure: Secondary | ICD-10-CM | POA: Diagnosis not present

## 2015-05-05 DIAGNOSIS — E43 Unspecified severe protein-calorie malnutrition: Secondary | ICD-10-CM | POA: Diagnosis not present

## 2015-05-05 DIAGNOSIS — E1140 Type 2 diabetes mellitus with diabetic neuropathy, unspecified: Secondary | ICD-10-CM

## 2015-05-05 DIAGNOSIS — IMO0002 Reserved for concepts with insufficient information to code with codable children: Secondary | ICD-10-CM

## 2015-05-05 DIAGNOSIS — Z794 Long term (current) use of insulin: Secondary | ICD-10-CM | POA: Diagnosis not present

## 2015-05-05 DIAGNOSIS — I5032 Chronic diastolic (congestive) heart failure: Secondary | ICD-10-CM

## 2015-05-05 DIAGNOSIS — N181 Chronic kidney disease, stage 1: Secondary | ICD-10-CM | POA: Diagnosis not present

## 2015-05-05 DIAGNOSIS — M5010 Cervical disc disorder with radiculopathy, unspecified cervical region: Secondary | ICD-10-CM

## 2015-05-05 DIAGNOSIS — E1165 Type 2 diabetes mellitus with hyperglycemia: Secondary | ICD-10-CM | POA: Diagnosis not present

## 2015-05-05 NOTE — Progress Notes (Signed)
Patient ID: OLIVERIO CHO, male   DOB: 12-May-1960, 55 y.o.   MRN: [id number]  Location:  [facility] Room Number: 407 Place of Service:  SNF ([phone])  Provider:Mahima Glade Lloyd, MD   PCP: [name], MD Patient Care Team: Tobey Grim, MD as PCP - General (Family Medicine)  Extended Emergency Contact Information Primary Emergency Contact: Faith Rogue [place] Phone: [phone] Relation: Friend Secondary Emergency Contact: [name] Address: [address]          [facility] [place], [place] [address] [place] of [place] Home Phone: [phone] Mobile Phone: [phone] Relation: Sister  Code Status:Full Code  Goals of care:  Advanced Directive information Advanced Directives 05/05/2015  Does patient have an advance directive? Yes  Type of Advance Directive (No Data)  Does patient want to make changes to advanced directive? No - Patient declined  Copy of advanced directive(s) in chart? Yes  Would patient like information on creating an advanced directive? No - patient declined information     No Known Allergies  Chief Complaint  Patient presents with  . Discharge Note    Discharge    HPI:  55 y.o. male seen today at [facility] for discharge home. He has been here for short term rehabilitation post hospital admission from 03/11/15-03/16/15 with acute on chronic renal failure with elevated creatinine of 4.5. He improved with iv fluids. He also had right hand weakness and pain. MRI of head and spine were obtained. No acute infarct, hydrocephalus was noted but neurosurgery did not think this was NPH. MRI spine showed cervical spondylosis and stenosis. He was also noted to have hemorrhoids and surgery was consulted but non surgical intervention with anusol cream, proctofoam and warm compress were recommended. He is seen in his room today. He has PMH of CKD, DM, HTN, diastolic heart failure among  others.He is seen in his room today. He states feeling sad that he has to leave since he had gotten used to the facility staff. He denies any new acute issues this visit. He has worked well  with PT/ OT will be discharged home to continue with PT/OT for ROM, exercise, gait stability and muscle strengthening. He will also require [facility] RN for peri-rectal wound management. He will follow up with Dr. Ollen Gross. Carolynne Edouard at  [facility] Tel # [phone] seen last 04/25/2015 to f/u in 6 weeks 06/05/2015 at 1:30 pm. He will also be discharged home with [facility] to assist with ADL's. He does not require any DME states has own Wheelchair at home. All HH service will be arranged by facility social worker prior to discharge home.     Past Medical History  Diagnosis Date  . Hypertension   . Chronic kidney disease   . Hypercholesteremia   . PONV (postoperative nausea and vomiting)   . Peripheral vascular disease (HCC)   . H/O hiatal hernia     hx of years ago   . Pneumonia 10/23/2013  . IDDM (insulin dependent diabetes mellitus) (HCC)   . Depression 08/2012    "just when foot first got cut off"  . Fistula     Past Surgical History  Procedure Laterality Date  . Amputation Right 05/03/2012    Procedure: Right First Ray AMPUTATION;  Surgeon: Eldred Manges, MD;  [facility];  Service: Orthopedics;  Laterality: Right;  . I&d extremity Right 05/29/2012    Procedure: IRRIGATION AND DEBRIDEMENT EXTREMITY- right foot;  Surgeon: Eldred Manges, MD;  Location: [facility] OR;  Service: Orthopedics;  Laterality: Right;  Right Foot Debridement, VAC Application  . Amputation Right 08/16/2012    Procedure: AMPUTATION BELOW KNEE;  Surgeon: Eldred Manges, MD;  Location: [facility] OR;  Service: Orthopedics;  Laterality: Right;  Right Below Knee Amputation  . Amputation Right 10/04/2012    Procedure: AMPUTATION BELOW KNEE REVISION;  Surgeon: Eldred Manges, MD;  Location: [facility] OR;  Service: Orthopedics;  Laterality: Right;  . Insertion of  suprapubic catheter N/A 03/02/2013    Procedure: INSERTION OF SUPRAPUBIC CATHETER;  Surgeon: Su Grand, MD;  Location: [facility] ORS;  Service: Urology;  Laterality: N/A;  . Tonsillectomy  ~ 1967  . Av fistula placement Right 11/01/2013    Procedure: RIGHT ARM ARTERIOVENOUS (AV) FISTULA CREATION;  Surgeon: Chuck Hint, MD;  Location: [facility] OR;  Service: Vascular;  Laterality: Right;  . Abdominal aortagram N/A 05/03/2012    Procedure: ABDOMINAL Ronny Flurry;  Surgeon: Sherren Kerns, MD;  Location: [facility] CATH LAB;  Service: Cardiovascular;  Laterality: N/A;  . Lower extremity angiogram Right 05/03/2012    Procedure: LOWER EXTREMITY ANGIOGRAM;  Surgeon: Sherren Kerns, MD;  Location: [facility] CATH LAB;  Service: Cardiovascular;  Laterality: Right;  rt leg angio      reports that he has never smoked. He has never used smokeless tobacco. He reports that he does not drink alcohol or use illicit drugs. Social History   Social History  . Marital Status: Single    Spouse Name: N/A  . Number of Children: 0  . Years of Education: HS   Occupational History  . Disabled    Social History Main Topics  . Smoking status: Never Smoker   . Smokeless tobacco: Never Used  . Alcohol Use: No  . Drug Use: No  . Sexual Activity: Not Currently   Other Topics Concern  . Not on file   Social History Narrative   Lives at home by himself. Cares for uncle with mental disabilities, about 15 years older.     Left-handed.   No caffeine use.      Functional Status Survey:    No Known Allergies  Pertinent  Health Maintenance Due  Topic Date Due  . FOOT EXAM  02/19/2012  . URINE MICROALBUMIN  02/19/2012  . OPHTHALMOLOGY EXAM  05/02/2015  . INFLUENZA VACCINE  05/09/2015 (Originally 09/09/2014)  . HEMOGLOBIN A1C  05/08/2015  . COLONOSCOPY  02/18/2021    Medications:   Medication List       This list is accurate as of: 05/05/15  5:36 PM.  Always use your most recent med list.               amLODipine 5 MG  tablet  Commonly known as:  NORVASC  Take 1 tablet by mouth  daily     ARTIFICIAL TEARS OP  2 drops both eyes every 6 hours as needed for burning or dry eyes.     aspirin 81 MG tablet  Take 81 mg by mouth daily.     atorvastatin 40 MG tablet  Commonly known as:  LIPITOR  Take 1 tablet (40 mg total) by mouth daily.     calcitRIOL 0.25 MCG capsule  Commonly known as:  ROCALTROL  Take 1 capsule (0.25 mcg total) by mouth daily.     EASY TOUCH PEN NEEDLES 31G X 8 MM Misc  Generic drug:  Insulin Pen Needle  USE AS DIRECTED TO INJECT INSULIN.  feeding supplement (PRO-STAT SUGAR FREE 64) Liqd  Take 30 mLs by mouth daily.     gabapentin 100 MG capsule  Commonly known as:  NEURONTIN  Take 1 capsule by mouth 3  times daily     HYDROcodone-acetaminophen 5-325 MG tablet  Commonly known as:  NORCO/VICODIN  Take one tablet by mouth every 4 hours as needed for moderate pain. Do not exceed 4gm of Tylenol in 24 hours     hydrocortisone 2.5 % rectal cream  Commonly known as:  ANUSOL-HC  Apply rectally 2 times daily     Insulin Glargine 100 UNIT/ML Solostar Pen  Commonly known as:  LANTUS SOLOSTAR  Inject 20 Units into the skin daily at 10 pm.     metoprolol tartrate 25 MG tablet  Commonly known as:  LOPRESSOR  Take 1 tablet (25 mg total) by mouth 2 (two) times daily.     polyethylene glycol packet  Commonly known as:  MIRALAX / GLYCOLAX  Take 17 g by mouth daily.     pramoxine 1 % foam  Commonly known as:  PROCTOFOAM  Place rectally 3 (three) times daily as needed for itching, irritation or hemorrhoids.     senna 8.6 MG tablet  Commonly known as:  SENOKOT  Take 2 tablets by mouth at bedtime.     sertraline 25 MG tablet  Commonly known as:  ZOLOFT  Take 25 mg by mouth daily.     SIMBRINZA 1-0.2 % Susp  Generic drug:  Brinzolamide-Brimonidine  Place 1 drop into the left eye 2 (two) times daily.     solifenacin 5 MG tablet  Commonly known as:  VESICARE  Take 5 mg by  mouth daily.     ULTICARE INSULIN SYRINGE 30G X 1/2" 0.3 ML Misc  Generic drug:  Insulin Syringe-Needle U-100  USE ONE SYRINGE AT BEDTIME        Review of Systems  Constitutional: Negative.   HENT: Negative.   Eyes: Negative.   Respiratory: Negative for cough, chest tightness, shortness of breath, wheezing and stridor.   Cardiovascular: Negative for chest pain, palpitations and leg swelling.  Gastrointestinal: Negative for nausea, vomiting, abdominal pain, diarrhea, constipation and abdominal distention.  Endocrine: Negative.   Genitourinary: Negative for dysuria, urgency, frequency, hematuria and flank pain.  Musculoskeletal: Positive for gait problem.       Right BKA   Skin: Negative.   Allergic/Immunologic: Negative.   Neurological: Negative.   Hematological: Negative.   Psychiatric/Behavioral: Negative for suicidal ideas, behavioral problems, confusion, sleep disturbance, self-injury and agitation. The patient is not nervous/anxious.        Feeling sad that he has to leave " I was used to the facility staff".     Filed Vitals:   05/05/15 1208  BP: 128/68  Pulse: 74  Temp: 97.8 F (36.6 C)  TempSrc: Oral  Resp: 18  Height:  (1.651 m)  Weight: 186 lb (84.369 kg)  SpO2: 97%   Body mass index is 30.95 kg/(m^2). Physical Exam  Constitutional: He is oriented to person, place, and time. He appears well-developed and well-nourished. No distress.  HENT:  Head: Normocephalic.  Mouth/Throat: Oropharynx is clear and moist.  Eyes: Conjunctivae and EOM are normal. Pupils are equal, round, and reactive to light. Right eye exhibits no discharge. Left eye exhibits no discharge. No scleral icterus.  Neck: Normal range of motion. No JVD present. No thyromegaly present.  Cardiovascular: Normal rate, regular rhythm and intact distal pulses.  Exam reveals no  gallop and no friction rub.   No murmur heard. Pulmonary/Chest: Effort normal and breath sounds normal. No respiratory  distress. He has no wheezes. He has no rales.  Abdominal: Soft. Bowel sounds are normal. He exhibits no distension and no mass. There is no tenderness. There is no rebound and no guarding.  Musculoskeletal: He exhibits no edema or tenderness.  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   Lymphadenopathy:    He has no cervical adenopathy.  Neurological: He is oriented to person, place, and time.  Skin: Skin is warm and dry. No rash noted. No erythema. No pallor.  Rectal wound managed by wound Nurse.   Psychiatric:  Sadness noted.     Labs reviewed: Basic Metabolic Panel:  Recent Labs  16/10/96 1051 11/19/14 1045  03/13/15 1144 03/14/15 0530 03/15/15 03/15/15 0900 04/02/15  NA 140 136  < > 133* 133* 134* 134* 135*  K 3.9 4.1  < > 3.1* 4.0 3.8 3.8 4.8  CL 105 102  < > 95* 96*  --  96*  --   CO2 25 24  < > 23 25  --  24  --   GLUCOSE 247* 281*  < > 236* 284*  --  264*  --   BUN 75* 87*  < > 75* 84* 93* 93* 56*  CREATININE 2.73* 2.66*  < > 3.68* 3.74* 3.4* 3.38* 2.8*  CALCIUM 9.4 9.3  < > 7.8* 8.3*  --  8.8*  --   PHOS 5.6* 5.8*  --   --   --   --   --   --   < > = values in this interval not displayed. Liver Function Tests:  Recent Labs  09/09/14 1051 11/19/14 1045 03/11/15 1400  AST  --   --  28  ALT  --   --  19  ALKPHOS  --   --  164*  BILITOT  --   --  1.1  PROT  --   --  6.8  ALBUMIN 3.1* 3.3* 2.4*   No results for input(s): LIPASE, AMYLASE in the last 8760 hours. No results for input(s): AMMONIA in the last 8760 hours. CBC:  Recent Labs  03/07/15 1852 03/11/15 1400  03/11/15 2126 03/12/15 03/12/15 0502 03/24/15  WBC 14.1* 17.1*  --  15.1* 13.3 13.3* 7.1  NEUTROABS 11.9* 14.5*  --   --   --   --   --   HGB 10.6* 9.8*  < > 9.6*  --  9.2* 9.1*  HCT 32.0* 30.0*  < > 29.5*  --  27.5* 29*  MCV 83.8 82.9  --  82.6  --  82.8  --   PLT 144* 214  --  218  --  214 221  < > = values in this interval not displayed. Cardiac Enzymes:  Recent Labs  03/14/15 1530  CKTOTAL 73    BNP: Invalid input(s): POCBNP CBG:  Recent Labs  03/15/15 2042 03/16/15 0830 03/16/15 1153  GLUCAP 253* 226* 219*    Procedures and Imaging Studies During Stay: No results found.  Assessment/Plan:   1. Hypertensive heart disease with CHF (congestive heart failure) (HCC) B/p stable. Continue on Amlodipine and Metoprolol.  PCP to monitor BMP   2. Uncontrolled type 2 diabetes mellitus with diabetic neuropathy, with long-term current use of insulin (HCC) CBG's ranges 100's-200's in AM and 130's-200's in PM continue on Lantus 20 units at bedtime. Monitor Hgb A1C   3. Neuropathy (HCC) Seen by [facility]  continue on Gabapentin 100 mg three times daily.   4. Chronic kidney disease (CKD), stage 4 CR 4.5 during hosp admission recent CR 2.83 ( 04/02/2015) baseline 2.6-2.9. Follows up with Dr. Kathrynn RunningManning at [facility]. Continue to avoid Nephrotoxins and dose all other medications for renal clearance. Follow up with PCP to monitor BMP  5. Depression with anxiety Currently on Sertraline 25 mg Tablet daily. Patient sad this visit that he is going home parting with the facility staff. PCP to monitor.   6. Protein-calorie malnutrition, severe Continue on Prostat 30 mls to promote wound healing.   7. Chronic diastolic heart failure (HCC) No recent weight gain. No edema or shortness of breath. Currently off diuretic. Continue on Metoprolol. Monitor weight daily.    8. Perirectal abscess Progressive healing.HH RN for peri-rectal wound management. Continue to follow up with Dr. Ollen GrossPaul S. Carolynne Edouardoth at  [facility] Tel # [phone] seen last 04/25/2015 to f/u in 6 weeks 06/05/2015 at 1:30 pm. 9.Abnormal Gait Will be dicharge home  PT/OT for ROM, exercise, gait stability and muscle strengthening. Fall and safety precautions.  10. Cervical Radiculopathy Pain controlled with Current  Norco 5/325 mg Tablet. Continue on to follow with [facility]  Patient is being discharged with  the following home health services:    PT/OT for ROM, exercise, gait stability and muscle strengthening.  HH RN for peri-rectal wound management.  HH Aid to assist with ADL's.   Patient is being discharged with the following durable medical equipment:   He does not require any DME states has own Wheelchair at home.   Rx written X 1 month supply.   Patient has been advised to f/u with their PCP in 1-2 weeks to bring them up to date on their rehab stay.  Social services at facility was responsible for arranging this appointment.  Pt was provided with a 30 day supply of prescriptions for medications and refills must be obtained from their PCP.  For controlled substances, a more limited supply may be provided adequate until PCP appointment only.  Future labs/tests needed: CBC, BMP, Hgb A1C

## 2015-05-06 DIAGNOSIS — I130 Hypertensive heart and chronic kidney disease with heart failure and stage 1 through stage 4 chronic kidney disease, or unspecified chronic kidney disease: Secondary | ICD-10-CM | POA: Diagnosis not present

## 2015-05-06 DIAGNOSIS — Z435 Encounter for attention to cystostomy: Secondary | ICD-10-CM | POA: Diagnosis not present

## 2015-05-06 DIAGNOSIS — R2689 Other abnormalities of gait and mobility: Secondary | ICD-10-CM | POA: Diagnosis not present

## 2015-05-06 DIAGNOSIS — R339 Retention of urine, unspecified: Secondary | ICD-10-CM | POA: Diagnosis not present

## 2015-05-06 DIAGNOSIS — N184 Chronic kidney disease, stage 4 (severe): Secondary | ICD-10-CM | POA: Diagnosis not present

## 2015-05-06 DIAGNOSIS — E1140 Type 2 diabetes mellitus with diabetic neuropathy, unspecified: Secondary | ICD-10-CM | POA: Diagnosis not present

## 2015-05-06 DIAGNOSIS — E1151 Type 2 diabetes mellitus with diabetic peripheral angiopathy without gangrene: Secondary | ICD-10-CM | POA: Diagnosis not present

## 2015-05-06 DIAGNOSIS — I5032 Chronic diastolic (congestive) heart failure: Secondary | ICD-10-CM | POA: Diagnosis not present

## 2015-05-06 DIAGNOSIS — M5010 Cervical disc disorder with radiculopathy, unspecified cervical region: Secondary | ICD-10-CM | POA: Diagnosis not present

## 2015-05-06 DIAGNOSIS — E1122 Type 2 diabetes mellitus with diabetic chronic kidney disease: Secondary | ICD-10-CM | POA: Diagnosis not present

## 2015-05-06 DIAGNOSIS — E43 Unspecified severe protein-calorie malnutrition: Secondary | ICD-10-CM | POA: Diagnosis not present

## 2015-05-06 DIAGNOSIS — K611 Rectal abscess: Secondary | ICD-10-CM | POA: Diagnosis not present

## 2015-05-07 ENCOUNTER — Telehealth: Payer: Self-pay | Admitting: *Deleted

## 2015-05-07 DIAGNOSIS — K611 Rectal abscess: Secondary | ICD-10-CM | POA: Diagnosis not present

## 2015-05-07 DIAGNOSIS — R2689 Other abnormalities of gait and mobility: Secondary | ICD-10-CM | POA: Diagnosis not present

## 2015-05-07 DIAGNOSIS — E1122 Type 2 diabetes mellitus with diabetic chronic kidney disease: Secondary | ICD-10-CM | POA: Diagnosis not present

## 2015-05-07 DIAGNOSIS — M5010 Cervical disc disorder with radiculopathy, unspecified cervical region: Secondary | ICD-10-CM | POA: Diagnosis not present

## 2015-05-07 DIAGNOSIS — R339 Retention of urine, unspecified: Secondary | ICD-10-CM | POA: Diagnosis not present

## 2015-05-07 DIAGNOSIS — E1140 Type 2 diabetes mellitus with diabetic neuropathy, unspecified: Secondary | ICD-10-CM | POA: Diagnosis not present

## 2015-05-07 DIAGNOSIS — I130 Hypertensive heart and chronic kidney disease with heart failure and stage 1 through stage 4 chronic kidney disease, or unspecified chronic kidney disease: Secondary | ICD-10-CM | POA: Diagnosis not present

## 2015-05-07 DIAGNOSIS — N184 Chronic kidney disease, stage 4 (severe): Secondary | ICD-10-CM | POA: Diagnosis not present

## 2015-05-07 DIAGNOSIS — E1151 Type 2 diabetes mellitus with diabetic peripheral angiopathy without gangrene: Secondary | ICD-10-CM | POA: Diagnosis not present

## 2015-05-07 DIAGNOSIS — E43 Unspecified severe protein-calorie malnutrition: Secondary | ICD-10-CM | POA: Diagnosis not present

## 2015-05-07 DIAGNOSIS — I5032 Chronic diastolic (congestive) heart failure: Secondary | ICD-10-CM | POA: Diagnosis not present

## 2015-05-07 DIAGNOSIS — Z435 Encounter for attention to cystostomy: Secondary | ICD-10-CM | POA: Diagnosis not present

## 2015-05-07 NOTE — Telephone Encounter (Signed)
Contacted Barnetta ChapelMark Watson to give verbal order for PT per Dr. Gwendolyn GrantWalden, and Barnetta ChapelMark Watson just wanted to let the PCP know that the pt feel last night due to not using his walker to go to the restroom in the middle of the night and had to call the Fire Dept to help him get back up.  Barnetta ChapelMark Watson stated that the pt was ok but just wanted to let us know because he had recently learned this information. Forwarding to PCP for an FYI. Lamonte SakaiZimmerman Rumple, [place]ill Bais D, [place]

## 2015-05-07 NOTE — Telephone Encounter (Signed)
Barnetta ChapelMark Watson, physical therapist with [facility], requesting verbal orders to continue PT services for patient twice a week until the end of May. May contact him at [phone]

## 2015-05-07 NOTE — Telephone Encounter (Signed)
Ok thanks for the update and for reiterating using his walker.

## 2015-05-07 NOTE — Telephone Encounter (Signed)
Please provide verbal orders for PT.  Thanks

## 2015-05-09 ENCOUNTER — Telehealth: Payer: Self-pay | Admitting: Family Medicine

## 2015-05-09 NOTE — Telephone Encounter (Signed)
Melissa with [facility] called and would like verbal orders for skilled nursing for the patient. She would like 3 times a week for the certification period. To include wound dressing care, PRN, use of lodoform packing strips and dry gauze on his buttocks area. Please call with the verbal orders and you can leave this also on her voice mail if she doesn't answer. jw

## 2015-05-09 NOTE — Telephone Encounter (Signed)
I am covering for Dr. Gwendolyn GrantWalden who is away from the office.  This is fine. Please call Melissa and give verbal orders.  Latrelle DodrillBrittany J Nyjah Denio, MD

## 2015-05-09 NOTE — Telephone Encounter (Signed)
Verbal given per Dr. Pollie MeyerMcIntyre. Lamonte SakaiZimmerman Rumple, Charlene Detter D, [place]

## 2015-05-12 DIAGNOSIS — R2689 Other abnormalities of gait and mobility: Secondary | ICD-10-CM | POA: Diagnosis not present

## 2015-05-12 DIAGNOSIS — E1151 Type 2 diabetes mellitus with diabetic peripheral angiopathy without gangrene: Secondary | ICD-10-CM | POA: Diagnosis not present

## 2015-05-12 DIAGNOSIS — E1122 Type 2 diabetes mellitus with diabetic chronic kidney disease: Secondary | ICD-10-CM | POA: Diagnosis not present

## 2015-05-12 DIAGNOSIS — E1140 Type 2 diabetes mellitus with diabetic neuropathy, unspecified: Secondary | ICD-10-CM | POA: Diagnosis not present

## 2015-05-12 DIAGNOSIS — K611 Rectal abscess: Secondary | ICD-10-CM | POA: Diagnosis not present

## 2015-05-12 DIAGNOSIS — Z435 Encounter for attention to cystostomy: Secondary | ICD-10-CM | POA: Diagnosis not present

## 2015-05-12 DIAGNOSIS — M5010 Cervical disc disorder with radiculopathy, unspecified cervical region: Secondary | ICD-10-CM | POA: Diagnosis not present

## 2015-05-12 DIAGNOSIS — I5032 Chronic diastolic (congestive) heart failure: Secondary | ICD-10-CM | POA: Diagnosis not present

## 2015-05-12 DIAGNOSIS — R339 Retention of urine, unspecified: Secondary | ICD-10-CM | POA: Diagnosis not present

## 2015-05-12 DIAGNOSIS — N184 Chronic kidney disease, stage 4 (severe): Secondary | ICD-10-CM | POA: Diagnosis not present

## 2015-05-12 DIAGNOSIS — E43 Unspecified severe protein-calorie malnutrition: Secondary | ICD-10-CM | POA: Diagnosis not present

## 2015-05-12 DIAGNOSIS — I130 Hypertensive heart and chronic kidney disease with heart failure and stage 1 through stage 4 chronic kidney disease, or unspecified chronic kidney disease: Secondary | ICD-10-CM | POA: Diagnosis not present

## 2015-05-14 DIAGNOSIS — Z435 Encounter for attention to cystostomy: Secondary | ICD-10-CM | POA: Diagnosis not present

## 2015-05-14 DIAGNOSIS — E1140 Type 2 diabetes mellitus with diabetic neuropathy, unspecified: Secondary | ICD-10-CM | POA: Diagnosis not present

## 2015-05-14 DIAGNOSIS — E43 Unspecified severe protein-calorie malnutrition: Secondary | ICD-10-CM | POA: Diagnosis not present

## 2015-05-14 DIAGNOSIS — M5010 Cervical disc disorder with radiculopathy, unspecified cervical region: Secondary | ICD-10-CM | POA: Diagnosis not present

## 2015-05-14 DIAGNOSIS — I5032 Chronic diastolic (congestive) heart failure: Secondary | ICD-10-CM | POA: Diagnosis not present

## 2015-05-14 DIAGNOSIS — I130 Hypertensive heart and chronic kidney disease with heart failure and stage 1 through stage 4 chronic kidney disease, or unspecified chronic kidney disease: Secondary | ICD-10-CM | POA: Diagnosis not present

## 2015-05-14 DIAGNOSIS — K611 Rectal abscess: Secondary | ICD-10-CM | POA: Diagnosis not present

## 2015-05-14 DIAGNOSIS — R339 Retention of urine, unspecified: Secondary | ICD-10-CM | POA: Diagnosis not present

## 2015-05-14 DIAGNOSIS — E1151 Type 2 diabetes mellitus with diabetic peripheral angiopathy without gangrene: Secondary | ICD-10-CM | POA: Diagnosis not present

## 2015-05-14 DIAGNOSIS — R2689 Other abnormalities of gait and mobility: Secondary | ICD-10-CM | POA: Diagnosis not present

## 2015-05-14 DIAGNOSIS — N184 Chronic kidney disease, stage 4 (severe): Secondary | ICD-10-CM | POA: Diagnosis not present

## 2015-05-14 DIAGNOSIS — E1122 Type 2 diabetes mellitus with diabetic chronic kidney disease: Secondary | ICD-10-CM | POA: Diagnosis not present

## 2015-05-15 DIAGNOSIS — R2689 Other abnormalities of gait and mobility: Secondary | ICD-10-CM | POA: Diagnosis not present

## 2015-05-15 DIAGNOSIS — K611 Rectal abscess: Secondary | ICD-10-CM | POA: Diagnosis not present

## 2015-05-15 DIAGNOSIS — N184 Chronic kidney disease, stage 4 (severe): Secondary | ICD-10-CM | POA: Diagnosis not present

## 2015-05-15 DIAGNOSIS — E1151 Type 2 diabetes mellitus with diabetic peripheral angiopathy without gangrene: Secondary | ICD-10-CM | POA: Diagnosis not present

## 2015-05-15 DIAGNOSIS — E1122 Type 2 diabetes mellitus with diabetic chronic kidney disease: Secondary | ICD-10-CM | POA: Diagnosis not present

## 2015-05-15 DIAGNOSIS — M5010 Cervical disc disorder with radiculopathy, unspecified cervical region: Secondary | ICD-10-CM | POA: Diagnosis not present

## 2015-05-15 DIAGNOSIS — Z435 Encounter for attention to cystostomy: Secondary | ICD-10-CM | POA: Diagnosis not present

## 2015-05-15 DIAGNOSIS — E1140 Type 2 diabetes mellitus with diabetic neuropathy, unspecified: Secondary | ICD-10-CM | POA: Diagnosis not present

## 2015-05-15 DIAGNOSIS — R339 Retention of urine, unspecified: Secondary | ICD-10-CM | POA: Diagnosis not present

## 2015-05-15 DIAGNOSIS — I5032 Chronic diastolic (congestive) heart failure: Secondary | ICD-10-CM | POA: Diagnosis not present

## 2015-05-15 DIAGNOSIS — E43 Unspecified severe protein-calorie malnutrition: Secondary | ICD-10-CM | POA: Diagnosis not present

## 2015-05-15 DIAGNOSIS — I130 Hypertensive heart and chronic kidney disease with heart failure and stage 1 through stage 4 chronic kidney disease, or unspecified chronic kidney disease: Secondary | ICD-10-CM | POA: Diagnosis not present

## 2015-05-16 DIAGNOSIS — N184 Chronic kidney disease, stage 4 (severe): Secondary | ICD-10-CM | POA: Diagnosis not present

## 2015-05-16 DIAGNOSIS — I130 Hypertensive heart and chronic kidney disease with heart failure and stage 1 through stage 4 chronic kidney disease, or unspecified chronic kidney disease: Secondary | ICD-10-CM | POA: Diagnosis not present

## 2015-05-16 DIAGNOSIS — R2689 Other abnormalities of gait and mobility: Secondary | ICD-10-CM | POA: Diagnosis not present

## 2015-05-16 DIAGNOSIS — E1151 Type 2 diabetes mellitus with diabetic peripheral angiopathy without gangrene: Secondary | ICD-10-CM | POA: Diagnosis not present

## 2015-05-16 DIAGNOSIS — R339 Retention of urine, unspecified: Secondary | ICD-10-CM | POA: Diagnosis not present

## 2015-05-16 DIAGNOSIS — K611 Rectal abscess: Secondary | ICD-10-CM | POA: Diagnosis not present

## 2015-05-16 DIAGNOSIS — Z435 Encounter for attention to cystostomy: Secondary | ICD-10-CM | POA: Diagnosis not present

## 2015-05-16 DIAGNOSIS — E43 Unspecified severe protein-calorie malnutrition: Secondary | ICD-10-CM | POA: Diagnosis not present

## 2015-05-16 DIAGNOSIS — E1140 Type 2 diabetes mellitus with diabetic neuropathy, unspecified: Secondary | ICD-10-CM | POA: Diagnosis not present

## 2015-05-16 DIAGNOSIS — I5032 Chronic diastolic (congestive) heart failure: Secondary | ICD-10-CM | POA: Diagnosis not present

## 2015-05-16 DIAGNOSIS — M5010 Cervical disc disorder with radiculopathy, unspecified cervical region: Secondary | ICD-10-CM | POA: Diagnosis not present

## 2015-05-16 DIAGNOSIS — E1122 Type 2 diabetes mellitus with diabetic chronic kidney disease: Secondary | ICD-10-CM | POA: Diagnosis not present

## 2015-05-19 ENCOUNTER — Encounter (HOSPITAL_BASED_OUTPATIENT_CLINIC_OR_DEPARTMENT_OTHER): Payer: Medicare Other

## 2015-05-20 DIAGNOSIS — I5032 Chronic diastolic (congestive) heart failure: Secondary | ICD-10-CM | POA: Diagnosis not present

## 2015-05-20 DIAGNOSIS — E43 Unspecified severe protein-calorie malnutrition: Secondary | ICD-10-CM | POA: Diagnosis not present

## 2015-05-20 DIAGNOSIS — N184 Chronic kidney disease, stage 4 (severe): Secondary | ICD-10-CM | POA: Diagnosis not present

## 2015-05-20 DIAGNOSIS — E1151 Type 2 diabetes mellitus with diabetic peripheral angiopathy without gangrene: Secondary | ICD-10-CM | POA: Diagnosis not present

## 2015-05-20 DIAGNOSIS — K611 Rectal abscess: Secondary | ICD-10-CM | POA: Diagnosis not present

## 2015-05-20 DIAGNOSIS — R2689 Other abnormalities of gait and mobility: Secondary | ICD-10-CM | POA: Diagnosis not present

## 2015-05-20 DIAGNOSIS — Z435 Encounter for attention to cystostomy: Secondary | ICD-10-CM | POA: Diagnosis not present

## 2015-05-20 DIAGNOSIS — I130 Hypertensive heart and chronic kidney disease with heart failure and stage 1 through stage 4 chronic kidney disease, or unspecified chronic kidney disease: Secondary | ICD-10-CM | POA: Diagnosis not present

## 2015-05-20 DIAGNOSIS — M5010 Cervical disc disorder with radiculopathy, unspecified cervical region: Secondary | ICD-10-CM | POA: Diagnosis not present

## 2015-05-20 DIAGNOSIS — R339 Retention of urine, unspecified: Secondary | ICD-10-CM | POA: Diagnosis not present

## 2015-05-20 DIAGNOSIS — E1122 Type 2 diabetes mellitus with diabetic chronic kidney disease: Secondary | ICD-10-CM | POA: Diagnosis not present

## 2015-05-20 DIAGNOSIS — E1140 Type 2 diabetes mellitus with diabetic neuropathy, unspecified: Secondary | ICD-10-CM | POA: Diagnosis not present

## 2015-05-22 ENCOUNTER — Other Ambulatory Visit: Payer: Self-pay | Admitting: *Deleted

## 2015-05-22 ENCOUNTER — Telehealth: Payer: Self-pay | Admitting: Family Medicine

## 2015-05-22 ENCOUNTER — Other Ambulatory Visit: Payer: Self-pay | Admitting: Family Medicine

## 2015-05-22 ENCOUNTER — Telehealth: Payer: Self-pay | Admitting: *Deleted

## 2015-05-22 DIAGNOSIS — E1140 Type 2 diabetes mellitus with diabetic neuropathy, unspecified: Secondary | ICD-10-CM | POA: Diagnosis not present

## 2015-05-22 DIAGNOSIS — E1122 Type 2 diabetes mellitus with diabetic chronic kidney disease: Secondary | ICD-10-CM | POA: Diagnosis not present

## 2015-05-22 DIAGNOSIS — E1151 Type 2 diabetes mellitus with diabetic peripheral angiopathy without gangrene: Secondary | ICD-10-CM | POA: Diagnosis not present

## 2015-05-22 DIAGNOSIS — R2689 Other abnormalities of gait and mobility: Secondary | ICD-10-CM | POA: Diagnosis not present

## 2015-05-22 DIAGNOSIS — M5010 Cervical disc disorder with radiculopathy, unspecified cervical region: Secondary | ICD-10-CM | POA: Diagnosis not present

## 2015-05-22 DIAGNOSIS — I5032 Chronic diastolic (congestive) heart failure: Secondary | ICD-10-CM | POA: Diagnosis not present

## 2015-05-22 DIAGNOSIS — N184 Chronic kidney disease, stage 4 (severe): Secondary | ICD-10-CM | POA: Diagnosis not present

## 2015-05-22 DIAGNOSIS — K611 Rectal abscess: Secondary | ICD-10-CM | POA: Diagnosis not present

## 2015-05-22 DIAGNOSIS — E43 Unspecified severe protein-calorie malnutrition: Secondary | ICD-10-CM | POA: Diagnosis not present

## 2015-05-22 DIAGNOSIS — I130 Hypertensive heart and chronic kidney disease with heart failure and stage 1 through stage 4 chronic kidney disease, or unspecified chronic kidney disease: Secondary | ICD-10-CM | POA: Diagnosis not present

## 2015-05-22 DIAGNOSIS — Z435 Encounter for attention to cystostomy: Secondary | ICD-10-CM | POA: Diagnosis not present

## 2015-05-22 DIAGNOSIS — R339 Retention of urine, unspecified: Secondary | ICD-10-CM | POA: Diagnosis not present

## 2015-05-22 NOTE — Telephone Encounter (Signed)
Need re- admission  to [facility].

## 2015-05-22 NOTE — Telephone Encounter (Signed)
Patient called in concerned that number of bowel movements has increased from 1 per day to 6 per day for the last 4 days States stools are soft, denies diarrhea Denies abdominal pain, nausea, vomiting Denies fever. States physical therapist took his temp today and it was 97.7 PO Patient is no longer taking miralax or senna Patient instructed to ensure he is drinking plenty of fluids as dehydration was a recent problem for him. Patient will call back if any other symptoms present. Note forwarded to PCP DUCATTE, Nickola MajorLAURENZE L, RN

## 2015-05-26 DIAGNOSIS — I130 Hypertensive heart and chronic kidney disease with heart failure and stage 1 through stage 4 chronic kidney disease, or unspecified chronic kidney disease: Secondary | ICD-10-CM | POA: Diagnosis not present

## 2015-05-26 DIAGNOSIS — M5010 Cervical disc disorder with radiculopathy, unspecified cervical region: Secondary | ICD-10-CM | POA: Diagnosis not present

## 2015-05-26 DIAGNOSIS — N184 Chronic kidney disease, stage 4 (severe): Secondary | ICD-10-CM | POA: Diagnosis not present

## 2015-05-26 DIAGNOSIS — E1151 Type 2 diabetes mellitus with diabetic peripheral angiopathy without gangrene: Secondary | ICD-10-CM | POA: Diagnosis not present

## 2015-05-26 DIAGNOSIS — E1140 Type 2 diabetes mellitus with diabetic neuropathy, unspecified: Secondary | ICD-10-CM | POA: Diagnosis not present

## 2015-05-26 DIAGNOSIS — E43 Unspecified severe protein-calorie malnutrition: Secondary | ICD-10-CM | POA: Diagnosis not present

## 2015-05-26 DIAGNOSIS — I5032 Chronic diastolic (congestive) heart failure: Secondary | ICD-10-CM | POA: Diagnosis not present

## 2015-05-26 DIAGNOSIS — E1122 Type 2 diabetes mellitus with diabetic chronic kidney disease: Secondary | ICD-10-CM | POA: Diagnosis not present

## 2015-05-26 DIAGNOSIS — R339 Retention of urine, unspecified: Secondary | ICD-10-CM | POA: Diagnosis not present

## 2015-05-26 DIAGNOSIS — R2689 Other abnormalities of gait and mobility: Secondary | ICD-10-CM | POA: Diagnosis not present

## 2015-05-26 DIAGNOSIS — Z435 Encounter for attention to cystostomy: Secondary | ICD-10-CM | POA: Diagnosis not present

## 2015-05-26 DIAGNOSIS — K611 Rectal abscess: Secondary | ICD-10-CM | POA: Diagnosis not present

## 2015-05-26 MED ORDER — INSULIN PEN NEEDLE 31G X 8 MM MISC: Status: DC

## 2015-05-26 NOTE — Telephone Encounter (Signed)
I'm not sure what this means.  I have completed a large stack of paperwork today and had it faxed back -- I'm assuming this is it.

## 2015-05-26 NOTE — Telephone Encounter (Signed)
I was out last week.  If this has persisted, please have him schedule for a visit.  Hopefully this has improved.

## 2015-05-27 DIAGNOSIS — R339 Retention of urine, unspecified: Secondary | ICD-10-CM | POA: Diagnosis not present

## 2015-05-27 DIAGNOSIS — E43 Unspecified severe protein-calorie malnutrition: Secondary | ICD-10-CM | POA: Diagnosis not present

## 2015-05-27 DIAGNOSIS — Z435 Encounter for attention to cystostomy: Secondary | ICD-10-CM | POA: Diagnosis not present

## 2015-05-27 DIAGNOSIS — E1122 Type 2 diabetes mellitus with diabetic chronic kidney disease: Secondary | ICD-10-CM | POA: Diagnosis not present

## 2015-05-27 DIAGNOSIS — R2689 Other abnormalities of gait and mobility: Secondary | ICD-10-CM | POA: Diagnosis not present

## 2015-05-27 DIAGNOSIS — E1151 Type 2 diabetes mellitus with diabetic peripheral angiopathy without gangrene: Secondary | ICD-10-CM | POA: Diagnosis not present

## 2015-05-27 DIAGNOSIS — I5032 Chronic diastolic (congestive) heart failure: Secondary | ICD-10-CM | POA: Diagnosis not present

## 2015-05-27 DIAGNOSIS — N184 Chronic kidney disease, stage 4 (severe): Secondary | ICD-10-CM | POA: Diagnosis not present

## 2015-05-27 DIAGNOSIS — K611 Rectal abscess: Secondary | ICD-10-CM | POA: Diagnosis not present

## 2015-05-27 DIAGNOSIS — M5010 Cervical disc disorder with radiculopathy, unspecified cervical region: Secondary | ICD-10-CM | POA: Diagnosis not present

## 2015-05-27 DIAGNOSIS — E1140 Type 2 diabetes mellitus with diabetic neuropathy, unspecified: Secondary | ICD-10-CM | POA: Diagnosis not present

## 2015-05-27 DIAGNOSIS — I130 Hypertensive heart and chronic kidney disease with heart failure and stage 1 through stage 4 chronic kidney disease, or unspecified chronic kidney disease: Secondary | ICD-10-CM | POA: Diagnosis not present

## 2015-05-29 DIAGNOSIS — N184 Chronic kidney disease, stage 4 (severe): Secondary | ICD-10-CM | POA: Diagnosis not present

## 2015-05-29 DIAGNOSIS — M5010 Cervical disc disorder with radiculopathy, unspecified cervical region: Secondary | ICD-10-CM | POA: Diagnosis not present

## 2015-05-29 DIAGNOSIS — E1122 Type 2 diabetes mellitus with diabetic chronic kidney disease: Secondary | ICD-10-CM | POA: Diagnosis not present

## 2015-05-29 DIAGNOSIS — Z435 Encounter for attention to cystostomy: Secondary | ICD-10-CM | POA: Diagnosis not present

## 2015-05-29 DIAGNOSIS — R339 Retention of urine, unspecified: Secondary | ICD-10-CM | POA: Diagnosis not present

## 2015-05-29 DIAGNOSIS — I5032 Chronic diastolic (congestive) heart failure: Secondary | ICD-10-CM | POA: Diagnosis not present

## 2015-05-29 DIAGNOSIS — E43 Unspecified severe protein-calorie malnutrition: Secondary | ICD-10-CM | POA: Diagnosis not present

## 2015-05-29 DIAGNOSIS — E1151 Type 2 diabetes mellitus with diabetic peripheral angiopathy without gangrene: Secondary | ICD-10-CM | POA: Diagnosis not present

## 2015-05-29 DIAGNOSIS — I130 Hypertensive heart and chronic kidney disease with heart failure and stage 1 through stage 4 chronic kidney disease, or unspecified chronic kidney disease: Secondary | ICD-10-CM | POA: Diagnosis not present

## 2015-05-29 DIAGNOSIS — R2689 Other abnormalities of gait and mobility: Secondary | ICD-10-CM | POA: Diagnosis not present

## 2015-05-29 DIAGNOSIS — K611 Rectal abscess: Secondary | ICD-10-CM | POA: Diagnosis not present

## 2015-05-29 DIAGNOSIS — E1140 Type 2 diabetes mellitus with diabetic neuropathy, unspecified: Secondary | ICD-10-CM | POA: Diagnosis not present

## 2015-05-30 DIAGNOSIS — E43 Unspecified severe protein-calorie malnutrition: Secondary | ICD-10-CM | POA: Diagnosis not present

## 2015-05-30 DIAGNOSIS — E1140 Type 2 diabetes mellitus with diabetic neuropathy, unspecified: Secondary | ICD-10-CM | POA: Diagnosis not present

## 2015-05-30 DIAGNOSIS — I130 Hypertensive heart and chronic kidney disease with heart failure and stage 1 through stage 4 chronic kidney disease, or unspecified chronic kidney disease: Secondary | ICD-10-CM | POA: Diagnosis not present

## 2015-05-30 DIAGNOSIS — R2689 Other abnormalities of gait and mobility: Secondary | ICD-10-CM | POA: Diagnosis not present

## 2015-05-30 DIAGNOSIS — I5032 Chronic diastolic (congestive) heart failure: Secondary | ICD-10-CM | POA: Diagnosis not present

## 2015-05-30 DIAGNOSIS — E1151 Type 2 diabetes mellitus with diabetic peripheral angiopathy without gangrene: Secondary | ICD-10-CM | POA: Diagnosis not present

## 2015-05-30 DIAGNOSIS — K611 Rectal abscess: Secondary | ICD-10-CM | POA: Diagnosis not present

## 2015-05-30 DIAGNOSIS — N184 Chronic kidney disease, stage 4 (severe): Secondary | ICD-10-CM | POA: Diagnosis not present

## 2015-05-30 DIAGNOSIS — R339 Retention of urine, unspecified: Secondary | ICD-10-CM | POA: Diagnosis not present

## 2015-05-30 DIAGNOSIS — M5010 Cervical disc disorder with radiculopathy, unspecified cervical region: Secondary | ICD-10-CM | POA: Diagnosis not present

## 2015-05-30 DIAGNOSIS — Z435 Encounter for attention to cystostomy: Secondary | ICD-10-CM | POA: Diagnosis not present

## 2015-05-30 DIAGNOSIS — E1122 Type 2 diabetes mellitus with diabetic chronic kidney disease: Secondary | ICD-10-CM | POA: Diagnosis not present

## 2015-06-02 NOTE — Telephone Encounter (Signed)
Called pt, voicemail box not set up. Could not leave a message. If pt calls, please scheduled an appt if he is not feeling better or have him speak with someone on white team. [name], CMA

## 2015-06-03 ENCOUNTER — Telehealth: Payer: Self-pay | Admitting: Family Medicine

## 2015-06-03 DIAGNOSIS — I5032 Chronic diastolic (congestive) heart failure: Secondary | ICD-10-CM | POA: Diagnosis not present

## 2015-06-03 DIAGNOSIS — E43 Unspecified severe protein-calorie malnutrition: Secondary | ICD-10-CM | POA: Diagnosis not present

## 2015-06-03 DIAGNOSIS — E1122 Type 2 diabetes mellitus with diabetic chronic kidney disease: Secondary | ICD-10-CM | POA: Diagnosis not present

## 2015-06-03 DIAGNOSIS — E1140 Type 2 diabetes mellitus with diabetic neuropathy, unspecified: Secondary | ICD-10-CM | POA: Diagnosis not present

## 2015-06-03 DIAGNOSIS — E1151 Type 2 diabetes mellitus with diabetic peripheral angiopathy without gangrene: Secondary | ICD-10-CM | POA: Diagnosis not present

## 2015-06-03 DIAGNOSIS — Z435 Encounter for attention to cystostomy: Secondary | ICD-10-CM | POA: Diagnosis not present

## 2015-06-03 DIAGNOSIS — M5010 Cervical disc disorder with radiculopathy, unspecified cervical region: Secondary | ICD-10-CM | POA: Diagnosis not present

## 2015-06-03 DIAGNOSIS — R2689 Other abnormalities of gait and mobility: Secondary | ICD-10-CM | POA: Diagnosis not present

## 2015-06-03 DIAGNOSIS — K611 Rectal abscess: Secondary | ICD-10-CM | POA: Diagnosis not present

## 2015-06-03 DIAGNOSIS — N184 Chronic kidney disease, stage 4 (severe): Secondary | ICD-10-CM | POA: Diagnosis not present

## 2015-06-03 DIAGNOSIS — I130 Hypertensive heart and chronic kidney disease with heart failure and stage 1 through stage 4 chronic kidney disease, or unspecified chronic kidney disease: Secondary | ICD-10-CM | POA: Diagnosis not present

## 2015-06-03 DIAGNOSIS — R339 Retention of urine, unspecified: Secondary | ICD-10-CM | POA: Diagnosis not present

## 2015-06-03 NOTE — Telephone Encounter (Signed)
Patient called back and states that he spoke with his insurance company and they need a script stating how often patient is taking his insulin.  His scripts for pen needles needs to reflect him only taking his insulin once a day.  They are ready to fill him 100 needles. Robb Sibal,CMA

## 2015-06-03 NOTE — Telephone Encounter (Signed)
Pt called because his online pharmacy is stating that they didn't receive a prescription from us for his pen needles. We did e-scribe this on 05/26/15. Can we call and get this straighten out [phone]. jw

## 2015-06-04 ENCOUNTER — Encounter: Payer: Medicare Other | Admitting: Neurology

## 2015-06-04 NOTE — Telephone Encounter (Signed)
I'm unsure about the message from his insurance.  I only have Lawrence [name] as taking his insulin once a day.  I don't see that he's on any other insulin more often.  Please call and let him know he should come in to discuss and so I can see him since he left the nursing home so we can update all of his medications.

## 2015-06-04 NOTE — Telephone Encounter (Signed)
Pt called and said that he does use the pens once a day and the prescription has to say once a day not as directed.Please call to tell his pharmacy. jw

## 2015-06-05 DIAGNOSIS — K611 Rectal abscess: Secondary | ICD-10-CM | POA: Diagnosis not present

## 2015-06-05 DIAGNOSIS — R2689 Other abnormalities of gait and mobility: Secondary | ICD-10-CM | POA: Diagnosis not present

## 2015-06-05 DIAGNOSIS — R339 Retention of urine, unspecified: Secondary | ICD-10-CM | POA: Diagnosis not present

## 2015-06-05 DIAGNOSIS — N184 Chronic kidney disease, stage 4 (severe): Secondary | ICD-10-CM | POA: Diagnosis not present

## 2015-06-05 DIAGNOSIS — E1151 Type 2 diabetes mellitus with diabetic peripheral angiopathy without gangrene: Secondary | ICD-10-CM | POA: Diagnosis not present

## 2015-06-05 DIAGNOSIS — Z435 Encounter for attention to cystostomy: Secondary | ICD-10-CM | POA: Diagnosis not present

## 2015-06-05 DIAGNOSIS — E1122 Type 2 diabetes mellitus with diabetic chronic kidney disease: Secondary | ICD-10-CM | POA: Diagnosis not present

## 2015-06-05 DIAGNOSIS — I5032 Chronic diastolic (congestive) heart failure: Secondary | ICD-10-CM | POA: Diagnosis not present

## 2015-06-05 DIAGNOSIS — M5010 Cervical disc disorder with radiculopathy, unspecified cervical region: Secondary | ICD-10-CM | POA: Diagnosis not present

## 2015-06-05 DIAGNOSIS — E1140 Type 2 diabetes mellitus with diabetic neuropathy, unspecified: Secondary | ICD-10-CM | POA: Diagnosis not present

## 2015-06-05 DIAGNOSIS — I130 Hypertensive heart and chronic kidney disease with heart failure and stage 1 through stage 4 chronic kidney disease, or unspecified chronic kidney disease: Secondary | ICD-10-CM | POA: Diagnosis not present

## 2015-06-05 DIAGNOSIS — E43 Unspecified severe protein-calorie malnutrition: Secondary | ICD-10-CM | POA: Diagnosis not present

## 2015-06-05 MED ORDER — INSULIN PEN NEEDLE 31G X 8 MM MISC: Status: AC

## 2015-06-05 NOTE — Telephone Encounter (Signed)
The insulin pen needle Rx has the directions as "use as directed to inject insulin"  But it needs to say " use to inject insulin once a day" per insurance otherwise they will not pay.

## 2015-06-05 NOTE — Telephone Encounter (Signed)
Completed.

## 2015-06-06 ENCOUNTER — Other Ambulatory Visit: Payer: Self-pay | Admitting: Family Medicine

## 2015-06-10 ENCOUNTER — Other Ambulatory Visit: Payer: Self-pay | Admitting: Family Medicine

## 2015-06-10 ENCOUNTER — Encounter: Payer: Self-pay | Admitting: Family Medicine

## 2015-06-10 ENCOUNTER — Ambulatory Visit (INDEPENDENT_AMBULATORY_CARE_PROVIDER_SITE_OTHER): Payer: Medicare Other | Admitting: Family Medicine

## 2015-06-10 VITALS — BP 161/66 | HR 63 | Temp 98.1°F | Ht 65.0 in

## 2015-06-10 DIAGNOSIS — E1140 Type 2 diabetes mellitus with diabetic neuropathy, unspecified: Secondary | ICD-10-CM | POA: Diagnosis not present

## 2015-06-10 DIAGNOSIS — M62521 Muscle wasting and atrophy, not elsewhere classified, right upper arm: Secondary | ICD-10-CM

## 2015-06-10 DIAGNOSIS — R339 Retention of urine, unspecified: Secondary | ICD-10-CM | POA: Diagnosis not present

## 2015-06-10 DIAGNOSIS — E1122 Type 2 diabetes mellitus with diabetic chronic kidney disease: Secondary | ICD-10-CM | POA: Diagnosis not present

## 2015-06-10 DIAGNOSIS — R197 Diarrhea, unspecified: Secondary | ICD-10-CM | POA: Diagnosis not present

## 2015-06-10 DIAGNOSIS — R2689 Other abnormalities of gait and mobility: Secondary | ICD-10-CM | POA: Diagnosis not present

## 2015-06-10 DIAGNOSIS — N184 Chronic kidney disease, stage 4 (severe): Secondary | ICD-10-CM | POA: Diagnosis not present

## 2015-06-10 DIAGNOSIS — M5010 Cervical disc disorder with radiculopathy, unspecified cervical region: Secondary | ICD-10-CM | POA: Diagnosis not present

## 2015-06-10 DIAGNOSIS — Z435 Encounter for attention to cystostomy: Secondary | ICD-10-CM | POA: Diagnosis not present

## 2015-06-10 DIAGNOSIS — Z794 Long term (current) use of insulin: Secondary | ICD-10-CM | POA: Diagnosis not present

## 2015-06-10 DIAGNOSIS — E1151 Type 2 diabetes mellitus with diabetic peripheral angiopathy without gangrene: Secondary | ICD-10-CM | POA: Diagnosis not present

## 2015-06-10 DIAGNOSIS — E1165 Type 2 diabetes mellitus with hyperglycemia: Secondary | ICD-10-CM | POA: Diagnosis not present

## 2015-06-10 DIAGNOSIS — I5032 Chronic diastolic (congestive) heart failure: Secondary | ICD-10-CM | POA: Diagnosis not present

## 2015-06-10 DIAGNOSIS — K611 Rectal abscess: Secondary | ICD-10-CM | POA: Diagnosis not present

## 2015-06-10 DIAGNOSIS — E43 Unspecified severe protein-calorie malnutrition: Secondary | ICD-10-CM | POA: Diagnosis not present

## 2015-06-10 DIAGNOSIS — I130 Hypertensive heart and chronic kidney disease with heart failure and stage 1 through stage 4 chronic kidney disease, or unspecified chronic kidney disease: Secondary | ICD-10-CM | POA: Diagnosis not present

## 2015-06-10 DIAGNOSIS — IMO0002 Reserved for concepts with insufficient information to code with codable children: Secondary | ICD-10-CM

## 2015-06-10 LAB — COMPREHENSIVE METABOLIC PANEL
ALBUMIN: 3.4 g/dL — AB (ref 3.6–5.1)
ALT: 8 U/L — AB (ref 9–46)
AST: 12 U/L (ref 10–35)
Alkaline Phosphatase: 81 U/L (ref 40–115)
BILIRUBIN TOTAL: 0.5 mg/dL (ref 0.2–1.2)
BUN: 38 mg/dL — AB (ref 7–25)
CO2: 23 mmol/L (ref 20–31)
CREATININE: 2.3 mg/dL — AB (ref 0.70–1.33)
Calcium: 8.9 mg/dL (ref 8.6–10.3)
Chloride: 110 mmol/L (ref 98–110)
Glucose, Bld: 110 mg/dL — ABNORMAL HIGH (ref 65–99)
Potassium: 3.7 mmol/L (ref 3.5–5.3)
SODIUM: 140 mmol/L (ref 135–146)
Total Protein: 6.1 g/dL (ref 6.1–8.1)

## 2015-06-10 LAB — CBC
HCT: 32.7 % — ABNORMAL LOW (ref 38.5–50.0)
HEMOGLOBIN: 10.4 g/dL — AB (ref 13.2–17.1)
MCH: 26.9 pg — ABNORMAL LOW (ref 27.0–33.0)
MCHC: 31.8 g/dL — ABNORMAL LOW (ref 32.0–36.0)
MCV: 84.5 fL (ref 80.0–100.0)
MPV: 10.8 fL (ref 7.5–12.5)
Platelets: 210 10*3/uL (ref 140–400)
RBC: 3.87 MIL/uL — AB (ref 4.20–5.80)
RDW: 18 % — ABNORMAL HIGH (ref 11.0–15.0)
WBC: 6.5 10*3/uL (ref 3.8–10.8)

## 2015-06-10 LAB — POCT GLYCOSYLATED HEMOGLOBIN (HGB A1C): HEMOGLOBIN A1C: 5.7

## 2015-06-10 MED ORDER — METRONIDAZOLE 500 MG PO TABS: 500.0000 mg | ORAL_TABLET | Freq: Three times a day (TID) | ORAL | Status: DC

## 2015-06-10 MED ORDER — CIPROFLOXACIN HCL 500 MG PO TABS: 500.0000 mg | ORAL_TABLET | Freq: Two times a day (BID) | ORAL | Status: DC

## 2015-06-10 MED ORDER — INSULIN GLARGINE 100 UNIT/ML SOLOSTAR PEN: 15.0000 [IU] | PEN_INJECTOR | Freq: Every day | SUBCUTANEOUS | Status: AC

## 2015-06-10 NOTE — Assessment & Plan Note (Signed)
Blood sugars much better today. -Actually decreased his Lantus to 15 units today. He has not had any evidence of hypoglycemia at home. We'll follow-up A1c in about 3 months. Continue checking his blood sugars

## 2015-06-10 NOTE — Progress Notes (Signed)
Subjective:    Lawrence [name] is a 55 y.o. male who presents to [facility] today for hospital and nursing home DC follow-up:  1.  Recent hospitalization:  Admitted for dehydration and weakness secondary to being bed-ridden from severe hemorrhiodal pain.  He was admitted to rehabilitation facility secondary to ongoing weakness. He is doing well since his discharge from the nursing home. He is drinking well though having some diarrhea after eating. He reports having diarrhea multiple times a day. This is been going on for about 3 weeks. States this started while he was at the rehabilitation facility. Feels his weight hasn't really changed despite being hungrier.  2. Diabetes:  Currently on Lantus 20 units a day. He is not really checking his blood sugars at home.    No adverse effects from medication.  No hypoglycemic events.  No paresthesia or peripheral nerve pain.   Lab Results  Component Value Date   HGBA1C 5.7 06/10/2015   #3. Diarrhea: As above. No fevers or chills. No real abdominal pain. Does have some occasional nausea. He is most able to eat peanut butter sandwiches but does not tolerate much else food. No hematochezia or melena.     The following portions of the patient's history were reviewed and updated as appropriate: allergies, current medications, past medical history, family and social history, and problem list. Patient is a nonsmoker.    PMH reviewed.  Past Medical History  Diagnosis Date  . Hypertension   . Chronic kidney disease   . Hypercholesteremia   . PONV (postoperative nausea and vomiting)   . Peripheral vascular disease (HCC)   . H/O hiatal hernia     hx of years ago   . Pneumonia 10/23/2013  . IDDM (insulin dependent diabetes mellitus) (HCC)   . Depression 08/2012    "just when foot first got cut off"  . Fistula    Past Surgical History  Procedure Laterality Date  . Amputation Right 05/03/2012    Procedure: Right First Ray AMPUTATION;  Surgeon: Eldred Manges, MD;   Location: [facility] OR;  Service: Orthopedics;  Laterality: Right;  . I&d extremity Right 05/29/2012    Procedure: IRRIGATION AND DEBRIDEMENT EXTREMITY- right foot;  Surgeon: Eldred Manges, MD;  Location: [facility] OR;  Service: Orthopedics;  Laterality: Right;  Right Foot Debridement, VAC Application  . Amputation Right 08/16/2012    Procedure: AMPUTATION BELOW KNEE;  Surgeon: Eldred Manges, MD;  Location: [facility] OR;  Service: Orthopedics;  Laterality: Right;  Right Below Knee Amputation  . Amputation Right 10/04/2012    Procedure: AMPUTATION BELOW KNEE REVISION;  Surgeon: Eldred Manges, MD;  Location: [facility] OR;  Service: Orthopedics;  Laterality: Right;  . Insertion of suprapubic catheter N/A 03/02/2013    Procedure: INSERTION OF SUPRAPUBIC CATHETER;  Surgeon: Su Grand, MD;  Location: [facility] ORS;  Service: Urology;  Laterality: N/A;  . Tonsillectomy  ~ 1967  . Av fistula placement Right 11/01/2013    Procedure: RIGHT ARM ARTERIOVENOUS (AV) FISTULA CREATION;  Surgeon: Chuck Hint, MD;  Location: [facility] OR;  Service: Vascular;  Laterality: Right;  . Abdominal aortagram N/A 05/03/2012    Procedure: ABDOMINAL Ronny Flurry;  Surgeon: Sherren Kerns, MD;  Location: [facility] CATH LAB;  Service: Cardiovascular;  Laterality: N/A;  . Lower extremity angiogram Right 05/03/2012    Procedure: LOWER EXTREMITY ANGIOGRAM;  Surgeon: Sherren Kerns, MD;  Location: [facility] CATH LAB;  Service: Cardiovascular;  Laterality: Right;  rt leg angio    Medications  reviewed. Current Outpatient Prescriptions  Medication Sig Dispense Refill  . Amino Acids-Protein Hydrolys (FEEDING SUPPLEMENT, PRO-STAT SUGAR FREE 64,) LIQD Take 30 mLs by mouth daily.    Marland Kitchen. amLODipine (NORVASC) 5 MG tablet Take 1 tablet by mouth  daily 60 tablet 6  . aspirin 81 MG tablet Take 81 mg by mouth daily.     Marland Kitchen. atorvastatin (LIPITOR) 40 MG tablet Take 1 tablet (40 mg total) by mouth daily. 90 tablet 1  . Brinzolamide-Brimonidine (SIMBRINZA) 1-0.2 % SUSP Place 1 drop into the left eye  2 (two) times daily.    . calcitRIOL (ROCALTROL) 0.25 MCG capsule Take 1 capsule (0.25 mcg total) by mouth daily. 30 capsule 0  . gabapentin (NEURONTIN) 100 MG capsule Take 1 capsule by mouth 3  times daily 180 capsule 2  . HYDROcodone-acetaminophen (NORCO/VICODIN) 5-325 MG tablet Take one tablet by mouth every 4 hours as needed for moderate pain. Do not exceed 4gm of Tylenol in 24 hours (Patient taking differently: Take 1-2 tablets by mouth every 4 hours as needed for moderate or severe pain. Do not exceed 4gm of Tylenol in 24 hours) 180 tablet 0  . hydrocortisone (ANUSOL-HC) 2.5 % rectal cream Apply rectally 2 times daily 28.35 g 0  . Hypromellose (ARTIFICIAL TEARS OP) 2 drops both eyes every 6 hours as needed for burning or dry eyes.    . Insulin Glargine (LANTUS SOLOSTAR) 100 UNIT/ML Solostar Pen Inject 20 Units into the skin daily at 10 pm. 15 mL 11  . Insulin Pen Needle (EASY TOUCH PEN NEEDLES) 31G X 8 MM MISC USE TO INJECT INSULIN once daily subcutaneously 100 each 4  . metoprolol tartrate (LOPRESSOR) 25 MG tablet Take 1 tablet (25 mg total) by mouth 2 (two) times daily. 180 tablet 1  . polyethylene glycol (MIRALAX / GLYCOLAX) packet Take 17 g by mouth daily. 14 each 0  . pramoxine (PROCTOFOAM) 1 % foam Place rectally 3 (three) times daily as needed for itching, irritation or hemorrhoids. 15 g 0  . senna (SENOKOT) 8.6 MG tablet Take 2 tablets by mouth at bedtime.    . sertraline (ZOLOFT) 25 MG tablet Take 25 mg by mouth daily.    . solifenacin (VESICARE) 5 MG tablet Take 5 mg by mouth daily.    Marland Kitchen. ULTICARE INSULIN SYRINGE 30G X 1/2" 0.3 ML MISC USE ONE SYRINGE AT BEDTIME 100 each 0   No current facility-administered medications for this visit.     Objective:   Physical Exam BP 161/66 mmHg  Pulse 63  Temp(Src) 98.1 F (36.7 C) (Oral)  Ht 5\' 5"  (1.651 m)  Wt  Gen:  Alert, cooperative patient who appears stated age in no acute distress.  Vital signs reviewed. HEENT: EOMI,   MMM Cardiac:  Regular rate and rhythm without murmur auscultated.  Good S1/S2. Pulm:  Clear to auscultation bilaterally with good air movement.  No wheezes or rales noted.   Abd:  Soft/nondistended/nontender Except to deep palpation. No guarding   Results for orders placed or performed in visit on 06/10/15 (from the past 72 hour(s))  POCT A1C     Status: None   Collection Time: 06/10/15  9:46 AM  Result Value Ref Range   Hemoglobin A1C 5.7     '

## 2015-06-10 NOTE — Assessment & Plan Note (Signed)
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 He would like to think about whether he would like to have surgery for this. He will call in a week or so after his thought and spoken with his family.

## 2015-06-10 NOTE — Telephone Encounter (Signed)
Pt was seen in office today. Zimmerman Rumple, Toree Edling D, CMA  

## 2015-06-10 NOTE — Patient Instructions (Signed)
Your A1C is MUCH better.  I'm a little nervous that you may now be on too much insulin.  Cut back to 15 units daily.    We'll recheck in 3 months.  No change to blood pressure medicine.  Take the Cipro twice daily for the next 7 days.  Take the Flagyl three times a day for the next 10 days.    Let me know what you think about the neck surgery.  Think about it for a week or so, then call and let Annice PihJackie know.     I'm glad to see that you're feeling much better!

## 2015-06-10 NOTE — Assessment & Plan Note (Signed)
Unable to check stool studies as patient unable to return to clinic -- very difficult to obtain transportation.   Treatign for any possible infectious disease including C. difficile. He is to follow-up if this does not improve.

## 2015-06-12 ENCOUNTER — Telehealth: Payer: Self-pay | Admitting: Family Medicine

## 2015-06-12 DIAGNOSIS — I130 Hypertensive heart and chronic kidney disease with heart failure and stage 1 through stage 4 chronic kidney disease, or unspecified chronic kidney disease: Secondary | ICD-10-CM | POA: Diagnosis not present

## 2015-06-12 DIAGNOSIS — K611 Rectal abscess: Secondary | ICD-10-CM | POA: Diagnosis not present

## 2015-06-12 DIAGNOSIS — Z435 Encounter for attention to cystostomy: Secondary | ICD-10-CM | POA: Diagnosis not present

## 2015-06-12 DIAGNOSIS — E1151 Type 2 diabetes mellitus with diabetic peripheral angiopathy without gangrene: Secondary | ICD-10-CM | POA: Diagnosis not present

## 2015-06-12 DIAGNOSIS — M4802 Spinal stenosis, cervical region: Secondary | ICD-10-CM

## 2015-06-12 DIAGNOSIS — E43 Unspecified severe protein-calorie malnutrition: Secondary | ICD-10-CM | POA: Diagnosis not present

## 2015-06-12 DIAGNOSIS — R339 Retention of urine, unspecified: Secondary | ICD-10-CM | POA: Diagnosis not present

## 2015-06-12 DIAGNOSIS — R2689 Other abnormalities of gait and mobility: Secondary | ICD-10-CM | POA: Diagnosis not present

## 2015-06-12 DIAGNOSIS — E1122 Type 2 diabetes mellitus with diabetic chronic kidney disease: Secondary | ICD-10-CM | POA: Diagnosis not present

## 2015-06-12 DIAGNOSIS — I5032 Chronic diastolic (congestive) heart failure: Secondary | ICD-10-CM | POA: Diagnosis not present

## 2015-06-12 DIAGNOSIS — M5010 Cervical disc disorder with radiculopathy, unspecified cervical region: Secondary | ICD-10-CM | POA: Diagnosis not present

## 2015-06-12 DIAGNOSIS — N184 Chronic kidney disease, stage 4 (severe): Secondary | ICD-10-CM | POA: Diagnosis not present

## 2015-06-12 DIAGNOSIS — E1140 Type 2 diabetes mellitus with diabetic neuropathy, unspecified: Secondary | ICD-10-CM | POA: Diagnosis not present

## 2015-06-12 NOTE — Telephone Encounter (Signed)
Pt called and has decided to be seen by a Neurologist. He said to go ahead and do the referral. He also needs a refill on his Vicodin. He also would like us to mail the prescription since he doesn't have a way to get other than transport ion and they will not make appointment for him to pick up a prescription. jw

## 2015-06-13 DIAGNOSIS — E1122 Type 2 diabetes mellitus with diabetic chronic kidney disease: Secondary | ICD-10-CM | POA: Diagnosis not present

## 2015-06-13 DIAGNOSIS — E1151 Type 2 diabetes mellitus with diabetic peripheral angiopathy without gangrene: Secondary | ICD-10-CM | POA: Diagnosis not present

## 2015-06-13 DIAGNOSIS — E43 Unspecified severe protein-calorie malnutrition: Secondary | ICD-10-CM | POA: Diagnosis not present

## 2015-06-13 DIAGNOSIS — R2689 Other abnormalities of gait and mobility: Secondary | ICD-10-CM | POA: Diagnosis not present

## 2015-06-13 DIAGNOSIS — K611 Rectal abscess: Secondary | ICD-10-CM | POA: Diagnosis not present

## 2015-06-13 DIAGNOSIS — N184 Chronic kidney disease, stage 4 (severe): Secondary | ICD-10-CM | POA: Diagnosis not present

## 2015-06-13 DIAGNOSIS — E1140 Type 2 diabetes mellitus with diabetic neuropathy, unspecified: Secondary | ICD-10-CM | POA: Diagnosis not present

## 2015-06-13 DIAGNOSIS — I5032 Chronic diastolic (congestive) heart failure: Secondary | ICD-10-CM | POA: Diagnosis not present

## 2015-06-13 DIAGNOSIS — I130 Hypertensive heart and chronic kidney disease with heart failure and stage 1 through stage 4 chronic kidney disease, or unspecified chronic kidney disease: Secondary | ICD-10-CM | POA: Diagnosis not present

## 2015-06-13 DIAGNOSIS — M5010 Cervical disc disorder with radiculopathy, unspecified cervical region: Secondary | ICD-10-CM | POA: Diagnosis not present

## 2015-06-13 DIAGNOSIS — Z435 Encounter for attention to cystostomy: Secondary | ICD-10-CM | POA: Diagnosis not present

## 2015-06-13 DIAGNOSIS — R339 Retention of urine, unspecified: Secondary | ICD-10-CM | POA: Diagnosis not present

## 2015-06-15 NOTE — Telephone Encounter (Signed)
I'm back on Wednesday and can complete his Vicodin refill then. I'll put in the Neurosurgery referral

## 2015-06-16 DIAGNOSIS — R2689 Other abnormalities of gait and mobility: Secondary | ICD-10-CM | POA: Diagnosis not present

## 2015-06-16 DIAGNOSIS — E1122 Type 2 diabetes mellitus with diabetic chronic kidney disease: Secondary | ICD-10-CM | POA: Diagnosis not present

## 2015-06-16 DIAGNOSIS — M5010 Cervical disc disorder with radiculopathy, unspecified cervical region: Secondary | ICD-10-CM | POA: Diagnosis not present

## 2015-06-16 DIAGNOSIS — E1151 Type 2 diabetes mellitus with diabetic peripheral angiopathy without gangrene: Secondary | ICD-10-CM | POA: Diagnosis not present

## 2015-06-16 DIAGNOSIS — E43 Unspecified severe protein-calorie malnutrition: Secondary | ICD-10-CM | POA: Diagnosis not present

## 2015-06-16 DIAGNOSIS — I130 Hypertensive heart and chronic kidney disease with heart failure and stage 1 through stage 4 chronic kidney disease, or unspecified chronic kidney disease: Secondary | ICD-10-CM | POA: Diagnosis not present

## 2015-06-16 DIAGNOSIS — Z435 Encounter for attention to cystostomy: Secondary | ICD-10-CM | POA: Diagnosis not present

## 2015-06-16 DIAGNOSIS — I5032 Chronic diastolic (congestive) heart failure: Secondary | ICD-10-CM | POA: Diagnosis not present

## 2015-06-16 DIAGNOSIS — E1140 Type 2 diabetes mellitus with diabetic neuropathy, unspecified: Secondary | ICD-10-CM | POA: Diagnosis not present

## 2015-06-16 DIAGNOSIS — R339 Retention of urine, unspecified: Secondary | ICD-10-CM | POA: Diagnosis not present

## 2015-06-16 DIAGNOSIS — N184 Chronic kidney disease, stage 4 (severe): Secondary | ICD-10-CM | POA: Diagnosis not present

## 2015-06-16 DIAGNOSIS — K611 Rectal abscess: Secondary | ICD-10-CM | POA: Diagnosis not present

## 2015-06-17 ENCOUNTER — Telehealth: Payer: Self-pay | Admitting: Family Medicine

## 2015-06-17 DIAGNOSIS — Z435 Encounter for attention to cystostomy: Secondary | ICD-10-CM | POA: Diagnosis not present

## 2015-06-17 DIAGNOSIS — N184 Chronic kidney disease, stage 4 (severe): Secondary | ICD-10-CM | POA: Diagnosis not present

## 2015-06-17 DIAGNOSIS — E1122 Type 2 diabetes mellitus with diabetic chronic kidney disease: Secondary | ICD-10-CM | POA: Diagnosis not present

## 2015-06-17 DIAGNOSIS — E1140 Type 2 diabetes mellitus with diabetic neuropathy, unspecified: Secondary | ICD-10-CM | POA: Diagnosis not present

## 2015-06-17 DIAGNOSIS — R2689 Other abnormalities of gait and mobility: Secondary | ICD-10-CM | POA: Diagnosis not present

## 2015-06-17 DIAGNOSIS — K611 Rectal abscess: Secondary | ICD-10-CM | POA: Diagnosis not present

## 2015-06-17 DIAGNOSIS — I5032 Chronic diastolic (congestive) heart failure: Secondary | ICD-10-CM | POA: Diagnosis not present

## 2015-06-17 DIAGNOSIS — I130 Hypertensive heart and chronic kidney disease with heart failure and stage 1 through stage 4 chronic kidney disease, or unspecified chronic kidney disease: Secondary | ICD-10-CM | POA: Diagnosis not present

## 2015-06-17 DIAGNOSIS — E1151 Type 2 diabetes mellitus with diabetic peripheral angiopathy without gangrene: Secondary | ICD-10-CM | POA: Diagnosis not present

## 2015-06-17 DIAGNOSIS — M5010 Cervical disc disorder with radiculopathy, unspecified cervical region: Secondary | ICD-10-CM | POA: Diagnosis not present

## 2015-06-17 DIAGNOSIS — R339 Retention of urine, unspecified: Secondary | ICD-10-CM | POA: Diagnosis not present

## 2015-06-17 DIAGNOSIS — E43 Unspecified severe protein-calorie malnutrition: Secondary | ICD-10-CM | POA: Diagnosis not present

## 2015-06-17 NOTE — Telephone Encounter (Signed)
Patient was d/charged from service due to non cooperation with nurse.  Called their office and asked to have service restarted. [facility] monthly due on 5/17.  Please call her as soon as possible.  Also to know size catheter so they can be ordered.

## 2015-06-18 DIAGNOSIS — Z435 Encounter for attention to cystostomy: Secondary | ICD-10-CM | POA: Diagnosis not present

## 2015-06-18 DIAGNOSIS — N184 Chronic kidney disease, stage 4 (severe): Secondary | ICD-10-CM | POA: Diagnosis not present

## 2015-06-18 DIAGNOSIS — E1151 Type 2 diabetes mellitus with diabetic peripheral angiopathy without gangrene: Secondary | ICD-10-CM | POA: Diagnosis not present

## 2015-06-18 DIAGNOSIS — R339 Retention of urine, unspecified: Secondary | ICD-10-CM | POA: Diagnosis not present

## 2015-06-18 DIAGNOSIS — E1140 Type 2 diabetes mellitus with diabetic neuropathy, unspecified: Secondary | ICD-10-CM | POA: Diagnosis not present

## 2015-06-18 DIAGNOSIS — R2689 Other abnormalities of gait and mobility: Secondary | ICD-10-CM | POA: Diagnosis not present

## 2015-06-18 DIAGNOSIS — M5010 Cervical disc disorder with radiculopathy, unspecified cervical region: Secondary | ICD-10-CM | POA: Diagnosis not present

## 2015-06-18 DIAGNOSIS — K611 Rectal abscess: Secondary | ICD-10-CM | POA: Diagnosis not present

## 2015-06-18 DIAGNOSIS — E1122 Type 2 diabetes mellitus with diabetic chronic kidney disease: Secondary | ICD-10-CM | POA: Diagnosis not present

## 2015-06-18 DIAGNOSIS — I130 Hypertensive heart and chronic kidney disease with heart failure and stage 1 through stage 4 chronic kidney disease, or unspecified chronic kidney disease: Secondary | ICD-10-CM | POA: Diagnosis not present

## 2015-06-18 DIAGNOSIS — I5032 Chronic diastolic (congestive) heart failure: Secondary | ICD-10-CM | POA: Diagnosis not present

## 2015-06-18 DIAGNOSIS — E43 Unspecified severe protein-calorie malnutrition: Secondary | ICD-10-CM | POA: Diagnosis not present

## 2015-06-18 NOTE — Telephone Encounter (Signed)
Please call for catheter size and to give verbal order for monthly catheter changes.

## 2015-06-18 NOTE — Telephone Encounter (Signed)
Contacted pt to verify catheter size and he stated is was 18". Lamonte SakaiZimmerman Rumple, April D, [place]

## 2015-06-18 NOTE — Telephone Encounter (Signed)
LM for michelle to call back to give her the below information and the orders. Lawrence SakaiZimmerman Rumple, April D, [place]

## 2015-06-19 DIAGNOSIS — I130 Hypertensive heart and chronic kidney disease with heart failure and stage 1 through stage 4 chronic kidney disease, or unspecified chronic kidney disease: Secondary | ICD-10-CM | POA: Diagnosis not present

## 2015-06-19 DIAGNOSIS — E1122 Type 2 diabetes mellitus with diabetic chronic kidney disease: Secondary | ICD-10-CM | POA: Diagnosis not present

## 2015-06-19 DIAGNOSIS — E1140 Type 2 diabetes mellitus with diabetic neuropathy, unspecified: Secondary | ICD-10-CM | POA: Diagnosis not present

## 2015-06-19 DIAGNOSIS — R2689 Other abnormalities of gait and mobility: Secondary | ICD-10-CM | POA: Diagnosis not present

## 2015-06-19 DIAGNOSIS — R339 Retention of urine, unspecified: Secondary | ICD-10-CM | POA: Diagnosis not present

## 2015-06-19 DIAGNOSIS — M5010 Cervical disc disorder with radiculopathy, unspecified cervical region: Secondary | ICD-10-CM | POA: Diagnosis not present

## 2015-06-19 DIAGNOSIS — I5032 Chronic diastolic (congestive) heart failure: Secondary | ICD-10-CM | POA: Diagnosis not present

## 2015-06-19 DIAGNOSIS — E1151 Type 2 diabetes mellitus with diabetic peripheral angiopathy without gangrene: Secondary | ICD-10-CM | POA: Diagnosis not present

## 2015-06-19 DIAGNOSIS — K611 Rectal abscess: Secondary | ICD-10-CM | POA: Diagnosis not present

## 2015-06-19 DIAGNOSIS — E43 Unspecified severe protein-calorie malnutrition: Secondary | ICD-10-CM | POA: Diagnosis not present

## 2015-06-19 DIAGNOSIS — N184 Chronic kidney disease, stage 4 (severe): Secondary | ICD-10-CM | POA: Diagnosis not present

## 2015-06-19 DIAGNOSIS — Z435 Encounter for attention to cystostomy: Secondary | ICD-10-CM | POA: Diagnosis not present

## 2015-06-19 NOTE — Telephone Encounter (Signed)
Michelle informed. Alea Ryer Dawn, CMA  

## 2015-06-20 DIAGNOSIS — R2689 Other abnormalities of gait and mobility: Secondary | ICD-10-CM | POA: Diagnosis not present

## 2015-06-20 DIAGNOSIS — N184 Chronic kidney disease, stage 4 (severe): Secondary | ICD-10-CM | POA: Diagnosis not present

## 2015-06-20 DIAGNOSIS — K611 Rectal abscess: Secondary | ICD-10-CM | POA: Diagnosis not present

## 2015-06-20 DIAGNOSIS — E1122 Type 2 diabetes mellitus with diabetic chronic kidney disease: Secondary | ICD-10-CM | POA: Diagnosis not present

## 2015-06-20 DIAGNOSIS — M5010 Cervical disc disorder with radiculopathy, unspecified cervical region: Secondary | ICD-10-CM | POA: Diagnosis not present

## 2015-06-20 DIAGNOSIS — I5032 Chronic diastolic (congestive) heart failure: Secondary | ICD-10-CM | POA: Diagnosis not present

## 2015-06-20 DIAGNOSIS — R339 Retention of urine, unspecified: Secondary | ICD-10-CM | POA: Diagnosis not present

## 2015-06-20 DIAGNOSIS — E43 Unspecified severe protein-calorie malnutrition: Secondary | ICD-10-CM | POA: Diagnosis not present

## 2015-06-20 DIAGNOSIS — Z435 Encounter for attention to cystostomy: Secondary | ICD-10-CM | POA: Diagnosis not present

## 2015-06-20 DIAGNOSIS — E1140 Type 2 diabetes mellitus with diabetic neuropathy, unspecified: Secondary | ICD-10-CM | POA: Diagnosis not present

## 2015-06-20 DIAGNOSIS — E1151 Type 2 diabetes mellitus with diabetic peripheral angiopathy without gangrene: Secondary | ICD-10-CM | POA: Diagnosis not present

## 2015-06-20 DIAGNOSIS — I130 Hypertensive heart and chronic kidney disease with heart failure and stage 1 through stage 4 chronic kidney disease, or unspecified chronic kidney disease: Secondary | ICD-10-CM | POA: Diagnosis not present

## 2015-06-23 ENCOUNTER — Other Ambulatory Visit: Payer: Self-pay | Admitting: Family Medicine

## 2015-06-23 DIAGNOSIS — I130 Hypertensive heart and chronic kidney disease with heart failure and stage 1 through stage 4 chronic kidney disease, or unspecified chronic kidney disease: Secondary | ICD-10-CM | POA: Diagnosis not present

## 2015-06-23 DIAGNOSIS — R2689 Other abnormalities of gait and mobility: Secondary | ICD-10-CM | POA: Diagnosis not present

## 2015-06-23 DIAGNOSIS — E1140 Type 2 diabetes mellitus with diabetic neuropathy, unspecified: Secondary | ICD-10-CM | POA: Diagnosis not present

## 2015-06-23 DIAGNOSIS — E1151 Type 2 diabetes mellitus with diabetic peripheral angiopathy without gangrene: Secondary | ICD-10-CM | POA: Diagnosis not present

## 2015-06-23 DIAGNOSIS — Z435 Encounter for attention to cystostomy: Secondary | ICD-10-CM | POA: Diagnosis not present

## 2015-06-23 DIAGNOSIS — M5010 Cervical disc disorder with radiculopathy, unspecified cervical region: Secondary | ICD-10-CM | POA: Diagnosis not present

## 2015-06-23 DIAGNOSIS — R339 Retention of urine, unspecified: Secondary | ICD-10-CM | POA: Diagnosis not present

## 2015-06-23 DIAGNOSIS — N184 Chronic kidney disease, stage 4 (severe): Secondary | ICD-10-CM | POA: Diagnosis not present

## 2015-06-23 DIAGNOSIS — E1122 Type 2 diabetes mellitus with diabetic chronic kidney disease: Secondary | ICD-10-CM | POA: Diagnosis not present

## 2015-06-23 DIAGNOSIS — E43 Unspecified severe protein-calorie malnutrition: Secondary | ICD-10-CM | POA: Diagnosis not present

## 2015-06-23 DIAGNOSIS — I5032 Chronic diastolic (congestive) heart failure: Secondary | ICD-10-CM | POA: Diagnosis not present

## 2015-06-23 DIAGNOSIS — K611 Rectal abscess: Secondary | ICD-10-CM | POA: Diagnosis not present

## 2015-06-23 NOTE — Telephone Encounter (Signed)
Call patient back about dark urine. Want to know if new medication could be the cause.

## 2015-06-24 ENCOUNTER — Ambulatory Visit: Payer: Medicare Other | Admitting: Family Medicine

## 2015-06-24 DIAGNOSIS — I130 Hypertensive heart and chronic kidney disease with heart failure and stage 1 through stage 4 chronic kidney disease, or unspecified chronic kidney disease: Secondary | ICD-10-CM | POA: Diagnosis not present

## 2015-06-24 DIAGNOSIS — E43 Unspecified severe protein-calorie malnutrition: Secondary | ICD-10-CM | POA: Diagnosis not present

## 2015-06-24 DIAGNOSIS — I5032 Chronic diastolic (congestive) heart failure: Secondary | ICD-10-CM | POA: Diagnosis not present

## 2015-06-24 DIAGNOSIS — E1140 Type 2 diabetes mellitus with diabetic neuropathy, unspecified: Secondary | ICD-10-CM | POA: Diagnosis not present

## 2015-06-24 DIAGNOSIS — Z435 Encounter for attention to cystostomy: Secondary | ICD-10-CM | POA: Diagnosis not present

## 2015-06-24 DIAGNOSIS — K611 Rectal abscess: Secondary | ICD-10-CM | POA: Diagnosis not present

## 2015-06-24 DIAGNOSIS — R2689 Other abnormalities of gait and mobility: Secondary | ICD-10-CM | POA: Diagnosis not present

## 2015-06-24 DIAGNOSIS — E1122 Type 2 diabetes mellitus with diabetic chronic kidney disease: Secondary | ICD-10-CM | POA: Diagnosis not present

## 2015-06-24 DIAGNOSIS — M5010 Cervical disc disorder with radiculopathy, unspecified cervical region: Secondary | ICD-10-CM | POA: Diagnosis not present

## 2015-06-24 DIAGNOSIS — N184 Chronic kidney disease, stage 4 (severe): Secondary | ICD-10-CM | POA: Diagnosis not present

## 2015-06-24 DIAGNOSIS — E1151 Type 2 diabetes mellitus with diabetic peripheral angiopathy without gangrene: Secondary | ICD-10-CM | POA: Diagnosis not present

## 2015-06-24 DIAGNOSIS — R339 Retention of urine, unspecified: Secondary | ICD-10-CM | POA: Diagnosis not present

## 2015-06-24 NOTE — Telephone Encounter (Signed)
Pt called because he has a UTI. He has no way to get here since he has to wait 3 days for SCAT. [facility] [address] jw

## 2015-06-25 DIAGNOSIS — Z435 Encounter for attention to cystostomy: Secondary | ICD-10-CM | POA: Diagnosis not present

## 2015-06-25 DIAGNOSIS — R339 Retention of urine, unspecified: Secondary | ICD-10-CM | POA: Diagnosis not present

## 2015-06-25 DIAGNOSIS — I130 Hypertensive heart and chronic kidney disease with heart failure and stage 1 through stage 4 chronic kidney disease, or unspecified chronic kidney disease: Secondary | ICD-10-CM | POA: Diagnosis not present

## 2015-06-25 DIAGNOSIS — K611 Rectal abscess: Secondary | ICD-10-CM | POA: Diagnosis not present

## 2015-06-25 DIAGNOSIS — E1122 Type 2 diabetes mellitus with diabetic chronic kidney disease: Secondary | ICD-10-CM | POA: Diagnosis not present

## 2015-06-25 DIAGNOSIS — N184 Chronic kidney disease, stage 4 (severe): Secondary | ICD-10-CM | POA: Diagnosis not present

## 2015-06-25 DIAGNOSIS — E1151 Type 2 diabetes mellitus with diabetic peripheral angiopathy without gangrene: Secondary | ICD-10-CM | POA: Diagnosis not present

## 2015-06-25 DIAGNOSIS — M5010 Cervical disc disorder with radiculopathy, unspecified cervical region: Secondary | ICD-10-CM | POA: Diagnosis not present

## 2015-06-25 DIAGNOSIS — I5032 Chronic diastolic (congestive) heart failure: Secondary | ICD-10-CM | POA: Diagnosis not present

## 2015-06-25 DIAGNOSIS — E1140 Type 2 diabetes mellitus with diabetic neuropathy, unspecified: Secondary | ICD-10-CM | POA: Diagnosis not present

## 2015-06-25 DIAGNOSIS — E43 Unspecified severe protein-calorie malnutrition: Secondary | ICD-10-CM | POA: Diagnosis not present

## 2015-06-25 DIAGNOSIS — R2689 Other abnormalities of gait and mobility: Secondary | ICD-10-CM | POA: Diagnosis not present

## 2015-06-25 MED ORDER — CEPHALEXIN 500 MG PO CAPS: 500.0000 mg | ORAL_CAPSULE | Freq: Three times a day (TID) | ORAL | Status: DC

## 2015-06-25 NOTE — Telephone Encounter (Signed)
I have sent in Keflex as an antibiotic for Mr. Murri.  He should take this 3 times a day for 7 days. [facility] [address].  If this doesn't work, he'll need to be seen because we'll need to test his urine.

## 2015-06-26 NOTE — Telephone Encounter (Signed)
Pt informed. Sharon T Saunders, CMA  

## 2015-06-27 ENCOUNTER — Telehealth: Payer: Self-pay | Admitting: Family Medicine

## 2015-06-27 DIAGNOSIS — I5032 Chronic diastolic (congestive) heart failure: Secondary | ICD-10-CM | POA: Diagnosis not present

## 2015-06-27 DIAGNOSIS — E1151 Type 2 diabetes mellitus with diabetic peripheral angiopathy without gangrene: Secondary | ICD-10-CM | POA: Diagnosis not present

## 2015-06-27 DIAGNOSIS — N184 Chronic kidney disease, stage 4 (severe): Secondary | ICD-10-CM | POA: Diagnosis not present

## 2015-06-27 DIAGNOSIS — R339 Retention of urine, unspecified: Secondary | ICD-10-CM | POA: Diagnosis not present

## 2015-06-27 DIAGNOSIS — M5010 Cervical disc disorder with radiculopathy, unspecified cervical region: Secondary | ICD-10-CM | POA: Diagnosis not present

## 2015-06-27 DIAGNOSIS — K611 Rectal abscess: Secondary | ICD-10-CM | POA: Diagnosis not present

## 2015-06-27 DIAGNOSIS — E1122 Type 2 diabetes mellitus with diabetic chronic kidney disease: Secondary | ICD-10-CM | POA: Diagnosis not present

## 2015-06-27 DIAGNOSIS — E43 Unspecified severe protein-calorie malnutrition: Secondary | ICD-10-CM | POA: Diagnosis not present

## 2015-06-27 DIAGNOSIS — Z435 Encounter for attention to cystostomy: Secondary | ICD-10-CM | POA: Diagnosis not present

## 2015-06-27 DIAGNOSIS — R2689 Other abnormalities of gait and mobility: Secondary | ICD-10-CM | POA: Diagnosis not present

## 2015-06-27 DIAGNOSIS — I130 Hypertensive heart and chronic kidney disease with heart failure and stage 1 through stage 4 chronic kidney disease, or unspecified chronic kidney disease: Secondary | ICD-10-CM | POA: Diagnosis not present

## 2015-06-27 DIAGNOSIS — E1140 Type 2 diabetes mellitus with diabetic neuropathy, unspecified: Secondary | ICD-10-CM | POA: Diagnosis not present

## 2015-06-27 NOTE — Telephone Encounter (Signed)
Patient wanted to know if gabapentin would turn urine brown.  Discussed this should not.  He is taking his keflex.  No fevers or chills.  Does say stomach feels "hot."  Has felt that way for weeks.  No diarrhea/constipation.  No abdominal pain.  Home health took temperature today and was normal.  Stomach was warm then.  Provided reassurance.  He is following up with me June.  Appreciated call.

## 2015-06-27 NOTE — Telephone Encounter (Signed)
Pt called and would like to ask a couple of questions about his gabapentin. jw

## 2015-06-30 ENCOUNTER — Telehealth: Payer: Self-pay | Admitting: Family Medicine

## 2015-06-30 DIAGNOSIS — N184 Chronic kidney disease, stage 4 (severe): Secondary | ICD-10-CM | POA: Diagnosis not present

## 2015-06-30 DIAGNOSIS — E43 Unspecified severe protein-calorie malnutrition: Secondary | ICD-10-CM | POA: Diagnosis not present

## 2015-06-30 DIAGNOSIS — K611 Rectal abscess: Secondary | ICD-10-CM | POA: Diagnosis not present

## 2015-06-30 DIAGNOSIS — R339 Retention of urine, unspecified: Secondary | ICD-10-CM | POA: Diagnosis not present

## 2015-06-30 DIAGNOSIS — Z435 Encounter for attention to cystostomy: Secondary | ICD-10-CM | POA: Diagnosis not present

## 2015-06-30 DIAGNOSIS — R2689 Other abnormalities of gait and mobility: Secondary | ICD-10-CM | POA: Diagnosis not present

## 2015-06-30 DIAGNOSIS — I5032 Chronic diastolic (congestive) heart failure: Secondary | ICD-10-CM | POA: Diagnosis not present

## 2015-06-30 DIAGNOSIS — E1122 Type 2 diabetes mellitus with diabetic chronic kidney disease: Secondary | ICD-10-CM | POA: Diagnosis not present

## 2015-06-30 DIAGNOSIS — E1140 Type 2 diabetes mellitus with diabetic neuropathy, unspecified: Secondary | ICD-10-CM | POA: Diagnosis not present

## 2015-06-30 DIAGNOSIS — E1151 Type 2 diabetes mellitus with diabetic peripheral angiopathy without gangrene: Secondary | ICD-10-CM | POA: Diagnosis not present

## 2015-06-30 DIAGNOSIS — M5010 Cervical disc disorder with radiculopathy, unspecified cervical region: Secondary | ICD-10-CM | POA: Diagnosis not present

## 2015-06-30 DIAGNOSIS — I130 Hypertensive heart and chronic kidney disease with heart failure and stage 1 through stage 4 chronic kidney disease, or unspecified chronic kidney disease: Secondary | ICD-10-CM | POA: Diagnosis not present

## 2015-06-30 NOTE — Telephone Encounter (Signed)
Running temps of 99.9.  Haven't had bowel movements in 2 wks.

## 2015-06-30 NOTE — Telephone Encounter (Signed)
Returned call to Sheria Langameron at cell # and at office with case manager Brennan Bailey(Tianna- [phone]).  Left message to call our office back for orders regarding bowel movement.  Altamese Dilling~Arlita Buffkin, BSN, RN-BC

## 2015-06-30 NOTE — Telephone Encounter (Signed)
He needs to start taking Miralax twice a day and docusate 100 mg twice a day as a stool softener.  If he is not passing gas, he needs to go straight to the ED.  Otherwise, if he hasn't had a BM in the next day or so, he should come in to be seen.   This is a more acute issue and should probably have been routed to a nurse.  I'll forward it to the Nurse pool.

## 2015-07-02 ENCOUNTER — Telehealth: Payer: Self-pay | Admitting: *Deleted

## 2015-07-02 DIAGNOSIS — M5010 Cervical disc disorder with radiculopathy, unspecified cervical region: Secondary | ICD-10-CM | POA: Diagnosis not present

## 2015-07-02 DIAGNOSIS — Z435 Encounter for attention to cystostomy: Secondary | ICD-10-CM | POA: Diagnosis not present

## 2015-07-02 DIAGNOSIS — R2689 Other abnormalities of gait and mobility: Secondary | ICD-10-CM | POA: Diagnosis not present

## 2015-07-02 DIAGNOSIS — I130 Hypertensive heart and chronic kidney disease with heart failure and stage 1 through stage 4 chronic kidney disease, or unspecified chronic kidney disease: Secondary | ICD-10-CM | POA: Diagnosis not present

## 2015-07-02 DIAGNOSIS — R339 Retention of urine, unspecified: Secondary | ICD-10-CM | POA: Diagnosis not present

## 2015-07-02 DIAGNOSIS — K611 Rectal abscess: Secondary | ICD-10-CM | POA: Diagnosis not present

## 2015-07-02 DIAGNOSIS — E1122 Type 2 diabetes mellitus with diabetic chronic kidney disease: Secondary | ICD-10-CM | POA: Diagnosis not present

## 2015-07-02 DIAGNOSIS — E1140 Type 2 diabetes mellitus with diabetic neuropathy, unspecified: Secondary | ICD-10-CM | POA: Diagnosis not present

## 2015-07-02 DIAGNOSIS — N184 Chronic kidney disease, stage 4 (severe): Secondary | ICD-10-CM | POA: Diagnosis not present

## 2015-07-02 DIAGNOSIS — E43 Unspecified severe protein-calorie malnutrition: Secondary | ICD-10-CM | POA: Diagnosis not present

## 2015-07-02 DIAGNOSIS — E1151 Type 2 diabetes mellitus with diabetic peripheral angiopathy without gangrene: Secondary | ICD-10-CM | POA: Diagnosis not present

## 2015-07-02 DIAGNOSIS — I5032 Chronic diastolic (congestive) heart failure: Secondary | ICD-10-CM | POA: Diagnosis not present

## 2015-07-02 NOTE — Telephone Encounter (Signed)
Lawrence LangCameron called back and she gave the patient the recommendations from Dr. Gwendolyn [name].  However patient does not have a Rx for the Docusate.  Patient also reported that he was not going to do any of the recommendations provided to him.  Will forward to PCP.  Lawrence [name], Lawrence L, RN

## 2015-07-02 NOTE — Telephone Encounter (Signed)
Spoke with Sheria Langameron regarding patient bowel movements.  Informed her of the following orders Miralax twice daily and Docusate 100 mg twice daily.  If no bowel in a day or two to call for an appointment. If patient is not passing gas, go to ED.  Sheria LangCameron will give nurse call back if patient needs a refill on the following medications.  Clovis PuMartin, Tamika L, RN

## 2015-07-02 NOTE — Telephone Encounter (Signed)
Koran, Physical Therapist [facility] requesting to continue physical therapy 2 times a week for 60 days.  They are making good progress, patient is standing and walking a little. Hoping to have patient independent in the next 8 weeks. Also requesting a new prescription for a standard wheelchair.  Patient is in need of a new wheelchair.  Please fax prescription to [phone].  Please give Loraine LericheMark a call at [phone] with verbal order for physical therapy.  Clovis PuMartin, Damaria Stofko L, RN

## 2015-07-03 DIAGNOSIS — I130 Hypertensive heart and chronic kidney disease with heart failure and stage 1 through stage 4 chronic kidney disease, or unspecified chronic kidney disease: Secondary | ICD-10-CM | POA: Diagnosis not present

## 2015-07-03 DIAGNOSIS — E43 Unspecified severe protein-calorie malnutrition: Secondary | ICD-10-CM | POA: Diagnosis not present

## 2015-07-03 DIAGNOSIS — E1151 Type 2 diabetes mellitus with diabetic peripheral angiopathy without gangrene: Secondary | ICD-10-CM | POA: Diagnosis not present

## 2015-07-03 DIAGNOSIS — E1122 Type 2 diabetes mellitus with diabetic chronic kidney disease: Secondary | ICD-10-CM | POA: Diagnosis not present

## 2015-07-03 DIAGNOSIS — Z435 Encounter for attention to cystostomy: Secondary | ICD-10-CM | POA: Diagnosis not present

## 2015-07-03 DIAGNOSIS — R2689 Other abnormalities of gait and mobility: Secondary | ICD-10-CM | POA: Diagnosis not present

## 2015-07-03 DIAGNOSIS — N184 Chronic kidney disease, stage 4 (severe): Secondary | ICD-10-CM | POA: Diagnosis not present

## 2015-07-03 DIAGNOSIS — I5032 Chronic diastolic (congestive) heart failure: Secondary | ICD-10-CM | POA: Diagnosis not present

## 2015-07-03 DIAGNOSIS — M5010 Cervical disc disorder with radiculopathy, unspecified cervical region: Secondary | ICD-10-CM | POA: Diagnosis not present

## 2015-07-03 DIAGNOSIS — E1140 Type 2 diabetes mellitus with diabetic neuropathy, unspecified: Secondary | ICD-10-CM | POA: Diagnosis not present

## 2015-07-03 DIAGNOSIS — K611 Rectal abscess: Secondary | ICD-10-CM | POA: Diagnosis not present

## 2015-07-03 DIAGNOSIS — R339 Retention of urine, unspecified: Secondary | ICD-10-CM | POA: Diagnosis not present

## 2015-07-03 MED ORDER — DOCUSATE SODIUM 100 MG PO CAPS: 100.0000 mg | ORAL_CAPSULE | Freq: Two times a day (BID) | ORAL | Status: DC

## 2015-07-03 MED ORDER — WHEELCHAIR MISC: Status: AC

## 2015-07-03 NOTE — Telephone Encounter (Signed)
Covering for Dr. Gwendolyn GrantWalden I will send in rx for docusate I'm unclear why patient is saying he won't do any of the recommendations. We are not able to help him if he won't follow recommendations. Agree with Dr. Rayvon CharWalden'sprior recommendations about needing to be seen if no BM with these treatments, and needing to go to ER if not passing gas.  Rx sent in for colace. Please inform Sheria LangCameron.  Thanks Latrelle DodrillBrittany J Lorea Kupfer, MD

## 2015-07-03 NOTE — Telephone Encounter (Signed)
LVM for Lawrence ChapelMark [name] to call back to give him the below information. Lawrence [name], Lawrence [name], [place]

## 2015-07-03 NOTE — Telephone Encounter (Signed)
Covering for Dr. Gwendolyn GrantWalden.  Rx for wheelchair printed. Will fax to # provided.  Please call and give verbal orders for requested weeks.  Latrelle DodrillBrittany J Chevie Birkhead, MD

## 2015-07-03 NOTE — Addendum Note (Signed)
Addended by: Latrelle DodrillMCINTYRE, Dowell Hoon J on: 07/03/2015 02:32 PM   Modules accepted: Orders

## 2015-07-03 NOTE — Telephone Encounter (Signed)
LVM for Sheria LangCameron to call back to inform her of below. Lamonte SakaiZimmerman Rumple, Aneesh Faller D, [place]

## 2015-07-04 DIAGNOSIS — R339 Retention of urine, unspecified: Secondary | ICD-10-CM | POA: Diagnosis not present

## 2015-07-04 DIAGNOSIS — E1122 Type 2 diabetes mellitus with diabetic chronic kidney disease: Secondary | ICD-10-CM | POA: Diagnosis not present

## 2015-07-04 DIAGNOSIS — E1151 Type 2 diabetes mellitus with diabetic peripheral angiopathy without gangrene: Secondary | ICD-10-CM | POA: Diagnosis not present

## 2015-07-04 DIAGNOSIS — E1140 Type 2 diabetes mellitus with diabetic neuropathy, unspecified: Secondary | ICD-10-CM | POA: Diagnosis not present

## 2015-07-04 DIAGNOSIS — I5032 Chronic diastolic (congestive) heart failure: Secondary | ICD-10-CM | POA: Diagnosis not present

## 2015-07-04 DIAGNOSIS — I130 Hypertensive heart and chronic kidney disease with heart failure and stage 1 through stage 4 chronic kidney disease, or unspecified chronic kidney disease: Secondary | ICD-10-CM | POA: Diagnosis not present

## 2015-07-04 DIAGNOSIS — Z435 Encounter for attention to cystostomy: Secondary | ICD-10-CM | POA: Diagnosis not present

## 2015-07-04 DIAGNOSIS — R2689 Other abnormalities of gait and mobility: Secondary | ICD-10-CM | POA: Diagnosis not present

## 2015-07-04 DIAGNOSIS — E43 Unspecified severe protein-calorie malnutrition: Secondary | ICD-10-CM | POA: Diagnosis not present

## 2015-07-04 DIAGNOSIS — M5010 Cervical disc disorder with radiculopathy, unspecified cervical region: Secondary | ICD-10-CM | POA: Diagnosis not present

## 2015-07-04 DIAGNOSIS — N184 Chronic kidney disease, stage 4 (severe): Secondary | ICD-10-CM | POA: Diagnosis not present

## 2015-07-04 DIAGNOSIS — K611 Rectal abscess: Secondary | ICD-10-CM | POA: Diagnosis not present

## 2015-07-04 NOTE — Telephone Encounter (Signed)
Pt calls back to let Dr. Gwendolyn GrantWalden know that he is having Bowel movements. Lawrence [name], [name], CMA

## 2015-07-04 NOTE — Telephone Encounter (Signed)
Barnetta ChapelMark Watson [facility] for another phone note and he also took the message for cameron that the Rx had been sent in. Lamonte SakaiZimmerman Rumple, Sorren Vallier D, [place]

## 2015-07-04 NOTE — Telephone Encounter (Signed)
Georg called back and i gave him the below information. Lamonte SakaiZimmerman Rumple, April D, [place]

## 2015-07-09 DIAGNOSIS — I130 Hypertensive heart and chronic kidney disease with heart failure and stage 1 through stage 4 chronic kidney disease, or unspecified chronic kidney disease: Secondary | ICD-10-CM | POA: Diagnosis not present

## 2015-07-09 DIAGNOSIS — N184 Chronic kidney disease, stage 4 (severe): Secondary | ICD-10-CM | POA: Diagnosis not present

## 2015-07-09 DIAGNOSIS — E1122 Type 2 diabetes mellitus with diabetic chronic kidney disease: Secondary | ICD-10-CM | POA: Diagnosis not present

## 2015-07-09 DIAGNOSIS — Z435 Encounter for attention to cystostomy: Secondary | ICD-10-CM | POA: Diagnosis not present

## 2015-07-09 DIAGNOSIS — Z794 Long term (current) use of insulin: Secondary | ICD-10-CM | POA: Diagnosis not present

## 2015-07-09 DIAGNOSIS — E1140 Type 2 diabetes mellitus with diabetic neuropathy, unspecified: Secondary | ICD-10-CM | POA: Diagnosis not present

## 2015-07-09 DIAGNOSIS — Z7982 Long term (current) use of aspirin: Secondary | ICD-10-CM | POA: Diagnosis not present

## 2015-07-09 DIAGNOSIS — E785 Hyperlipidemia, unspecified: Secondary | ICD-10-CM | POA: Diagnosis not present

## 2015-07-09 DIAGNOSIS — I5032 Chronic diastolic (congestive) heart failure: Secondary | ICD-10-CM | POA: Diagnosis not present

## 2015-07-09 DIAGNOSIS — M5010 Cervical disc disorder with radiculopathy, unspecified cervical region: Secondary | ICD-10-CM | POA: Diagnosis not present

## 2015-07-09 DIAGNOSIS — R339 Retention of urine, unspecified: Secondary | ICD-10-CM | POA: Diagnosis not present

## 2015-07-09 DIAGNOSIS — E1151 Type 2 diabetes mellitus with diabetic peripheral angiopathy without gangrene: Secondary | ICD-10-CM | POA: Diagnosis not present

## 2015-07-10 DIAGNOSIS — E1151 Type 2 diabetes mellitus with diabetic peripheral angiopathy without gangrene: Secondary | ICD-10-CM | POA: Diagnosis not present

## 2015-07-10 DIAGNOSIS — E1140 Type 2 diabetes mellitus with diabetic neuropathy, unspecified: Secondary | ICD-10-CM | POA: Diagnosis not present

## 2015-07-10 DIAGNOSIS — Z435 Encounter for attention to cystostomy: Secondary | ICD-10-CM | POA: Diagnosis not present

## 2015-07-10 DIAGNOSIS — I130 Hypertensive heart and chronic kidney disease with heart failure and stage 1 through stage 4 chronic kidney disease, or unspecified chronic kidney disease: Secondary | ICD-10-CM | POA: Diagnosis not present

## 2015-07-10 DIAGNOSIS — R339 Retention of urine, unspecified: Secondary | ICD-10-CM | POA: Diagnosis not present

## 2015-07-10 DIAGNOSIS — E785 Hyperlipidemia, unspecified: Secondary | ICD-10-CM | POA: Diagnosis not present

## 2015-07-10 DIAGNOSIS — Z794 Long term (current) use of insulin: Secondary | ICD-10-CM | POA: Diagnosis not present

## 2015-07-10 DIAGNOSIS — E1122 Type 2 diabetes mellitus with diabetic chronic kidney disease: Secondary | ICD-10-CM | POA: Diagnosis not present

## 2015-07-10 DIAGNOSIS — I5032 Chronic diastolic (congestive) heart failure: Secondary | ICD-10-CM | POA: Diagnosis not present

## 2015-07-10 DIAGNOSIS — M5010 Cervical disc disorder with radiculopathy, unspecified cervical region: Secondary | ICD-10-CM | POA: Diagnosis not present

## 2015-07-10 DIAGNOSIS — Z7982 Long term (current) use of aspirin: Secondary | ICD-10-CM | POA: Diagnosis not present

## 2015-07-10 DIAGNOSIS — N184 Chronic kidney disease, stage 4 (severe): Secondary | ICD-10-CM | POA: Diagnosis not present

## 2015-07-11 ENCOUNTER — Telehealth: Payer: Self-pay | Admitting: Family Medicine

## 2015-07-11 ENCOUNTER — Telehealth: Payer: Self-pay | Admitting: *Deleted

## 2015-07-11 DIAGNOSIS — E1122 Type 2 diabetes mellitus with diabetic chronic kidney disease: Secondary | ICD-10-CM | POA: Diagnosis not present

## 2015-07-11 DIAGNOSIS — N184 Chronic kidney disease, stage 4 (severe): Secondary | ICD-10-CM | POA: Diagnosis not present

## 2015-07-11 DIAGNOSIS — E1151 Type 2 diabetes mellitus with diabetic peripheral angiopathy without gangrene: Secondary | ICD-10-CM | POA: Diagnosis not present

## 2015-07-11 DIAGNOSIS — Z794 Long term (current) use of insulin: Secondary | ICD-10-CM | POA: Diagnosis not present

## 2015-07-11 DIAGNOSIS — M5010 Cervical disc disorder with radiculopathy, unspecified cervical region: Secondary | ICD-10-CM | POA: Diagnosis not present

## 2015-07-11 DIAGNOSIS — I130 Hypertensive heart and chronic kidney disease with heart failure and stage 1 through stage 4 chronic kidney disease, or unspecified chronic kidney disease: Secondary | ICD-10-CM | POA: Diagnosis not present

## 2015-07-11 DIAGNOSIS — I5032 Chronic diastolic (congestive) heart failure: Secondary | ICD-10-CM | POA: Diagnosis not present

## 2015-07-11 DIAGNOSIS — Z7982 Long term (current) use of aspirin: Secondary | ICD-10-CM | POA: Diagnosis not present

## 2015-07-11 DIAGNOSIS — R339 Retention of urine, unspecified: Secondary | ICD-10-CM | POA: Diagnosis not present

## 2015-07-11 DIAGNOSIS — Z435 Encounter for attention to cystostomy: Secondary | ICD-10-CM | POA: Diagnosis not present

## 2015-07-11 DIAGNOSIS — E785 Hyperlipidemia, unspecified: Secondary | ICD-10-CM | POA: Diagnosis not present

## 2015-07-11 DIAGNOSIS — E1140 Type 2 diabetes mellitus with diabetic neuropathy, unspecified: Secondary | ICD-10-CM | POA: Diagnosis not present

## 2015-07-11 NOTE — Telephone Encounter (Signed)
Pt is requesting a referral for a GI doctor.jw

## 2015-07-11 NOTE — Telephone Encounter (Signed)
LVM for Lawrence [name] and gave her the verbal orders on her phone line, also tried to call Lawrence [name] and LVM for her to return call to give her the information from Dr. Gwendolyn [name]. Lawrence [name], Lawrence [name] D, [place]

## 2015-07-11 NOTE — Telephone Encounter (Signed)
Social worker with [facility] called and needs verbal order starting 07-14-15 for 1 visit a week for 1 week for assistance with applying for medicaid.  She requested that we just leave a message on her voicemail if she doesn't answer. Iona Stay,CMA

## 2015-07-11 NOTE — Telephone Encounter (Signed)
Duplicate message.  Referral request sent to provider along with orders requested by [facility]. Naquita Nappier,CMA

## 2015-07-11 NOTE — Telephone Encounter (Signed)
Lawrence [name] is calling for 2 things: 1)  She is calling for the status of his hospital bed order.  If it can just be signed and faxed back they will be able to get this for him.   2) Patient is also requesting a referral for a GI provider.  He states that he took the medication advised by Dr. Gwendolyn GrantWalden for his constipation and now has been experiencing diarrhea.  Please advise if patient needs to be seen here first or if a referral can be placed.  Please contact patient or Lawrence [name] regarding his next step. [name],CMA

## 2015-07-11 NOTE — Telephone Encounter (Signed)
Please place verbal order.  Also I signed hospital bed order sometime last week.  If he continues having diarrhea, let us know and I"ll place the referral.

## 2015-07-14 ENCOUNTER — Other Ambulatory Visit: Payer: Self-pay | Admitting: *Deleted

## 2015-07-14 DIAGNOSIS — I130 Hypertensive heart and chronic kidney disease with heart failure and stage 1 through stage 4 chronic kidney disease, or unspecified chronic kidney disease: Secondary | ICD-10-CM | POA: Diagnosis not present

## 2015-07-14 DIAGNOSIS — N184 Chronic kidney disease, stage 4 (severe): Secondary | ICD-10-CM | POA: Diagnosis not present

## 2015-07-14 DIAGNOSIS — Z794 Long term (current) use of insulin: Secondary | ICD-10-CM | POA: Diagnosis not present

## 2015-07-14 DIAGNOSIS — E1122 Type 2 diabetes mellitus with diabetic chronic kidney disease: Secondary | ICD-10-CM | POA: Diagnosis not present

## 2015-07-14 DIAGNOSIS — Z435 Encounter for attention to cystostomy: Secondary | ICD-10-CM | POA: Diagnosis not present

## 2015-07-14 DIAGNOSIS — E785 Hyperlipidemia, unspecified: Secondary | ICD-10-CM | POA: Diagnosis not present

## 2015-07-14 DIAGNOSIS — R339 Retention of urine, unspecified: Secondary | ICD-10-CM | POA: Diagnosis not present

## 2015-07-14 DIAGNOSIS — E1151 Type 2 diabetes mellitus with diabetic peripheral angiopathy without gangrene: Secondary | ICD-10-CM | POA: Diagnosis not present

## 2015-07-14 DIAGNOSIS — M5010 Cervical disc disorder with radiculopathy, unspecified cervical region: Secondary | ICD-10-CM | POA: Diagnosis not present

## 2015-07-14 DIAGNOSIS — Z7982 Long term (current) use of aspirin: Secondary | ICD-10-CM | POA: Diagnosis not present

## 2015-07-14 DIAGNOSIS — E1140 Type 2 diabetes mellitus with diabetic neuropathy, unspecified: Secondary | ICD-10-CM | POA: Diagnosis not present

## 2015-07-14 DIAGNOSIS — I5032 Chronic diastolic (congestive) heart failure: Secondary | ICD-10-CM | POA: Diagnosis not present

## 2015-07-14 DIAGNOSIS — G5621 Lesion of ulnar nerve, right upper limb: Secondary | ICD-10-CM | POA: Diagnosis not present

## 2015-07-14 MED ORDER — ASPIRIN 81 MG PO TABS: 81.0000 mg | ORAL_TABLET | Freq: Every day | ORAL | Status: DC

## 2015-07-14 NOTE — Telephone Encounter (Signed)
I received a form last week (or the week before) about a mattress for Mr. File.  I completed it and placed it in the to be faxed box.  I haven't seen anything else since then.  If they send it again I'll complete it again.

## 2015-07-14 NOTE — Telephone Encounter (Signed)
Dr. Elmore GuiseWalden Cameron called back, asking if we received a form back from [facility] or [facility]. The form is asking questions about controls, type of mattress etc. Have you seen this form Sunday SpillersSharon T Sara Keys, CMA

## 2015-07-15 ENCOUNTER — Other Ambulatory Visit: Payer: Self-pay | Admitting: *Deleted

## 2015-07-15 ENCOUNTER — Ambulatory Visit: Payer: Medicare Other | Admitting: Family Medicine

## 2015-07-16 DIAGNOSIS — Z794 Long term (current) use of insulin: Secondary | ICD-10-CM | POA: Diagnosis not present

## 2015-07-16 DIAGNOSIS — Z435 Encounter for attention to cystostomy: Secondary | ICD-10-CM | POA: Diagnosis not present

## 2015-07-16 DIAGNOSIS — I130 Hypertensive heart and chronic kidney disease with heart failure and stage 1 through stage 4 chronic kidney disease, or unspecified chronic kidney disease: Secondary | ICD-10-CM | POA: Diagnosis not present

## 2015-07-16 DIAGNOSIS — Z7982 Long term (current) use of aspirin: Secondary | ICD-10-CM | POA: Diagnosis not present

## 2015-07-16 DIAGNOSIS — I5032 Chronic diastolic (congestive) heart failure: Secondary | ICD-10-CM | POA: Diagnosis not present

## 2015-07-16 DIAGNOSIS — E1140 Type 2 diabetes mellitus with diabetic neuropathy, unspecified: Secondary | ICD-10-CM | POA: Diagnosis not present

## 2015-07-16 DIAGNOSIS — E1151 Type 2 diabetes mellitus with diabetic peripheral angiopathy without gangrene: Secondary | ICD-10-CM | POA: Diagnosis not present

## 2015-07-16 DIAGNOSIS — E1122 Type 2 diabetes mellitus with diabetic chronic kidney disease: Secondary | ICD-10-CM | POA: Diagnosis not present

## 2015-07-16 DIAGNOSIS — M5010 Cervical disc disorder with radiculopathy, unspecified cervical region: Secondary | ICD-10-CM | POA: Diagnosis not present

## 2015-07-16 DIAGNOSIS — E785 Hyperlipidemia, unspecified: Secondary | ICD-10-CM | POA: Diagnosis not present

## 2015-07-16 DIAGNOSIS — N184 Chronic kidney disease, stage 4 (severe): Secondary | ICD-10-CM | POA: Diagnosis not present

## 2015-07-16 DIAGNOSIS — R339 Retention of urine, unspecified: Secondary | ICD-10-CM | POA: Diagnosis not present

## 2015-07-16 MED ORDER — GABAPENTIN 100 MG PO CAPS: ORAL_CAPSULE | ORAL | Status: DC

## 2015-07-16 NOTE — Telephone Encounter (Signed)
Pt is checking on the status of his request for a refill on his Gabapentin. jw

## 2015-07-16 NOTE — Telephone Encounter (Signed)
2nd request.  Lash Matulich L, RN  

## 2015-07-16 NOTE — Telephone Encounter (Signed)
LVM for Sheria LangCameron to call back to inform her of below and see if she has received or if she needs to send again. Lamonte SakaiZimmerman Rumple, Pedram Goodchild D, [place]

## 2015-07-17 DIAGNOSIS — E785 Hyperlipidemia, unspecified: Secondary | ICD-10-CM | POA: Diagnosis not present

## 2015-07-17 DIAGNOSIS — D631 Anemia in chronic kidney disease: Secondary | ICD-10-CM | POA: Diagnosis not present

## 2015-07-17 DIAGNOSIS — I5032 Chronic diastolic (congestive) heart failure: Secondary | ICD-10-CM | POA: Diagnosis not present

## 2015-07-17 DIAGNOSIS — E1151 Type 2 diabetes mellitus with diabetic peripheral angiopathy without gangrene: Secondary | ICD-10-CM | POA: Diagnosis not present

## 2015-07-17 DIAGNOSIS — N2581 Secondary hyperparathyroidism of renal origin: Secondary | ICD-10-CM | POA: Diagnosis not present

## 2015-07-17 DIAGNOSIS — Z7982 Long term (current) use of aspirin: Secondary | ICD-10-CM | POA: Diagnosis not present

## 2015-07-17 DIAGNOSIS — E1140 Type 2 diabetes mellitus with diabetic neuropathy, unspecified: Secondary | ICD-10-CM | POA: Diagnosis not present

## 2015-07-17 DIAGNOSIS — Z435 Encounter for attention to cystostomy: Secondary | ICD-10-CM | POA: Diagnosis not present

## 2015-07-17 DIAGNOSIS — E1129 Type 2 diabetes mellitus with other diabetic kidney complication: Secondary | ICD-10-CM | POA: Diagnosis not present

## 2015-07-17 DIAGNOSIS — I130 Hypertensive heart and chronic kidney disease with heart failure and stage 1 through stage 4 chronic kidney disease, or unspecified chronic kidney disease: Secondary | ICD-10-CM | POA: Diagnosis not present

## 2015-07-17 DIAGNOSIS — M5010 Cervical disc disorder with radiculopathy, unspecified cervical region: Secondary | ICD-10-CM | POA: Diagnosis not present

## 2015-07-17 DIAGNOSIS — I770 Arteriovenous fistula, acquired: Secondary | ICD-10-CM | POA: Diagnosis not present

## 2015-07-17 DIAGNOSIS — N189 Chronic kidney disease, unspecified: Secondary | ICD-10-CM | POA: Diagnosis not present

## 2015-07-17 DIAGNOSIS — R339 Retention of urine, unspecified: Secondary | ICD-10-CM | POA: Diagnosis not present

## 2015-07-17 DIAGNOSIS — N184 Chronic kidney disease, stage 4 (severe): Secondary | ICD-10-CM | POA: Diagnosis not present

## 2015-07-17 DIAGNOSIS — E1122 Type 2 diabetes mellitus with diabetic chronic kidney disease: Secondary | ICD-10-CM | POA: Diagnosis not present

## 2015-07-17 DIAGNOSIS — Z794 Long term (current) use of insulin: Secondary | ICD-10-CM | POA: Diagnosis not present

## 2015-07-18 ENCOUNTER — Telehealth: Payer: Self-pay | Admitting: Family Medicine

## 2015-07-18 DIAGNOSIS — R339 Retention of urine, unspecified: Secondary | ICD-10-CM | POA: Diagnosis not present

## 2015-07-18 DIAGNOSIS — Z794 Long term (current) use of insulin: Secondary | ICD-10-CM | POA: Diagnosis not present

## 2015-07-18 DIAGNOSIS — Z7982 Long term (current) use of aspirin: Secondary | ICD-10-CM | POA: Diagnosis not present

## 2015-07-18 DIAGNOSIS — E1122 Type 2 diabetes mellitus with diabetic chronic kidney disease: Secondary | ICD-10-CM | POA: Diagnosis not present

## 2015-07-18 DIAGNOSIS — I130 Hypertensive heart and chronic kidney disease with heart failure and stage 1 through stage 4 chronic kidney disease, or unspecified chronic kidney disease: Secondary | ICD-10-CM | POA: Diagnosis not present

## 2015-07-18 DIAGNOSIS — E785 Hyperlipidemia, unspecified: Secondary | ICD-10-CM | POA: Diagnosis not present

## 2015-07-18 DIAGNOSIS — N184 Chronic kidney disease, stage 4 (severe): Secondary | ICD-10-CM | POA: Diagnosis not present

## 2015-07-18 DIAGNOSIS — I5032 Chronic diastolic (congestive) heart failure: Secondary | ICD-10-CM | POA: Diagnosis not present

## 2015-07-18 DIAGNOSIS — Z435 Encounter for attention to cystostomy: Secondary | ICD-10-CM | POA: Diagnosis not present

## 2015-07-18 DIAGNOSIS — M5010 Cervical disc disorder with radiculopathy, unspecified cervical region: Secondary | ICD-10-CM | POA: Diagnosis not present

## 2015-07-18 DIAGNOSIS — E1151 Type 2 diabetes mellitus with diabetic peripheral angiopathy without gangrene: Secondary | ICD-10-CM | POA: Diagnosis not present

## 2015-07-18 DIAGNOSIS — E1140 Type 2 diabetes mellitus with diabetic neuropathy, unspecified: Secondary | ICD-10-CM | POA: Diagnosis not present

## 2015-07-18 NOTE — Telephone Encounter (Signed)
Patient attempting to get Medicaid again and was told by DSS that he needed his PCP to completed a FL-2 form. Please advise.

## 2015-07-20 ENCOUNTER — Telehealth: Payer: Self-pay | Admitting: Family Medicine

## 2015-07-20 NOTE — Telephone Encounter (Signed)
After hours telephone call  Patient calls to report that he fell asleep last night and forgot to take Lantus.  He wonders if he should add a dose this morning or wait until tonight.  Advised patient to monitor CBGs today.  Wait until tonight to take Lantus to avoid stacking too much at once.  Erasmo DownerAngela M Jaymie Mckiddy, MD, MPH PGY-2,  [facility] 07/20/2015 8:24 AM

## 2015-07-21 NOTE — Telephone Encounter (Signed)
Patient called to check on his FL2 form.  I informed him that pcp is out of the office this week and that I would check with our CSW to see if she could help with this while pcp is gone.  This way he can just sign it when he gets back.  Patient voiced understanding and will plan to wait and hear back from us.  Kaila Devries,CMA

## 2015-07-21 NOTE — Telephone Encounter (Signed)
Closed in error. Lawrence [name],CMA

## 2015-07-21 NOTE — Telephone Encounter (Signed)
Form placed in CSW box to be started while PCP is out of office this week. Lamonte SakaiZimmerman Rumple, April D, [place]

## 2015-07-22 ENCOUNTER — Ambulatory Visit (INDEPENDENT_AMBULATORY_CARE_PROVIDER_SITE_OTHER): Payer: Medicare Other | Admitting: Family

## 2015-07-22 ENCOUNTER — Encounter: Payer: Self-pay | Admitting: Family

## 2015-07-22 VITALS — BP 158/64 | HR 65 | Temp 98.3°F | Resp 16 | Ht 65.0 in | Wt 170.0 lb

## 2015-07-22 DIAGNOSIS — Z7982 Long term (current) use of aspirin: Secondary | ICD-10-CM | POA: Diagnosis not present

## 2015-07-22 DIAGNOSIS — T82898D Other specified complication of vascular prosthetic devices, implants and grafts, subsequent encounter: Secondary | ICD-10-CM | POA: Diagnosis not present

## 2015-07-22 DIAGNOSIS — N184 Chronic kidney disease, stage 4 (severe): Secondary | ICD-10-CM | POA: Diagnosis not present

## 2015-07-22 DIAGNOSIS — E1151 Type 2 diabetes mellitus with diabetic peripheral angiopathy without gangrene: Secondary | ICD-10-CM | POA: Diagnosis not present

## 2015-07-22 DIAGNOSIS — M5010 Cervical disc disorder with radiculopathy, unspecified cervical region: Secondary | ICD-10-CM | POA: Diagnosis not present

## 2015-07-22 DIAGNOSIS — Z794 Long term (current) use of insulin: Secondary | ICD-10-CM | POA: Diagnosis not present

## 2015-07-22 DIAGNOSIS — E1122 Type 2 diabetes mellitus with diabetic chronic kidney disease: Secondary | ICD-10-CM | POA: Diagnosis not present

## 2015-07-22 DIAGNOSIS — I130 Hypertensive heart and chronic kidney disease with heart failure and stage 1 through stage 4 chronic kidney disease, or unspecified chronic kidney disease: Secondary | ICD-10-CM | POA: Diagnosis not present

## 2015-07-22 DIAGNOSIS — Z435 Encounter for attention to cystostomy: Secondary | ICD-10-CM | POA: Diagnosis not present

## 2015-07-22 DIAGNOSIS — R339 Retention of urine, unspecified: Secondary | ICD-10-CM | POA: Diagnosis not present

## 2015-07-22 DIAGNOSIS — E1140 Type 2 diabetes mellitus with diabetic neuropathy, unspecified: Secondary | ICD-10-CM | POA: Diagnosis not present

## 2015-07-22 DIAGNOSIS — E785 Hyperlipidemia, unspecified: Secondary | ICD-10-CM | POA: Diagnosis not present

## 2015-07-22 DIAGNOSIS — I5032 Chronic diastolic (congestive) heart failure: Secondary | ICD-10-CM | POA: Diagnosis not present

## 2015-07-22 NOTE — Progress Notes (Signed)
Filed Vitals:   07/22/15 1455 07/22/15 1501  BP: 155/66 158/64  Pulse: 65 65  Temp: 98.3 F (36.8 C)   Resp: 16   Height: 5\' 5"  (1.651 m)   Weight: 170 lb (77.111 kg)   SpO2: 100%

## 2015-07-22 NOTE — Progress Notes (Signed)
Established Dialysis Access  History of Present Illness  Lawrence [name] is a 55 y.o. (06-14-1960) male patient of Dr. Edilia Boickson who had a right brachiocephalic AV fistula placed on 16/10/960409/24/2015.  Pt returns today with c/o AVF feels warm x 2 weeks, swollen x 1 week, painful since insertion of AVF. He reports fever and chills intermittently for 2 weeks. He has an appointment with Dr. Darrick PennaFields for the same reasons in 2 days. Pt states his nephrologist, Dr. Eliott Nineunham, told him he may have a clot in his AVF. He has trouble gripping his walker, he is able to propel his wheelchair with his hands. He only walks when physical therapist is with him.  He had severe dehydration and hemorrhoids, was in a SNF for 2 months, has been home for 2 months. 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  He states he does not feel like he has a bladder infection lately.  He is not yet on HD, and states it looks like he will not need HD any time soon. He is left hand dominant.   Dr. Edilia Boickson last saw pt on 12/26/13 At that time pt came in for 6 week follow up visit. He was not yet on dialysis. He had noted some paresthesias in the right hand that had been relatively stable. He continued to exercise his arm every day. The fistula was maturing nicely. He had mild steal symptoms. Dr. Edilia Boickson explained that if his symptoms progressed to let us know as we would have to consider either ligation of his fistula or potentially a DRIL procedure. However his symptoms at that time appeared tolerable and stable. The fistula would be ready to use an approximate 6 weeks if it is needed.   Past Medical History  Diagnosis Date  . Hypertension   . Chronic kidney disease   . Hypercholesteremia   . PONV (postoperative nausea and vomiting)   . Peripheral vascular disease (HCC)   . H/O hiatal hernia     hx of years ago   . Pneumonia 10/23/2013  . IDDM (insulin dependent diabetes mellitus)  (HCC)   . Depression 08/2012    "just when foot first got cut off"  . Fistula     Social History Social History  Substance Use Topics  . Smoking status: Never Smoker   . Smokeless tobacco: Never Used  . Alcohol Use: No    Family History Family History  Problem Relation Age of Onset  . Stroke Mother   . Cancer Father     Unsure of type    Surgical History Past Surgical History  Procedure Laterality Date  . Amputation Right 05/03/2012    Procedure: Right First Ray AMPUTATION;  Surgeon: Lawrence MangesMark C Yates, MD;  [facility];  Service: Orthopedics;  Laterality: Right;  . I&d extremity Right 05/29/2012    Procedure: IRRIGATION AND DEBRIDEMENT EXTREMITY- right foot;  Surgeon: Lawrence MangesMark C Yates, MD;  [facility]: [facility] OR;  Service: Orthopedics;  Laterality: Right;  Right Foot Debridement, VAC Application  . Amputation Right 08/16/2012    Procedure: AMPUTATION BELOW KNEE;  Surgeon: Lawrence MangesMark C Yates, MD;  [facility];  Service: Orthopedics;  Laterality: Right;  Right Below Knee Amputation  . Amputation Right 10/04/2012    Procedure: AMPUTATION BELOW KNEE REVISION;  Surgeon: Lawrence MangesMark C Yates, MD;  Location: [facility] OR;  Service: Orthopedics;  Laterality: Right;  . Insertion of suprapubic catheter N/A 03/02/2013  Procedure: INSERTION OF SUPRAPUBIC CATHETER;  Surgeon: Lawrence Grand, MD;  Location: [facility] ORS;  Service: Urology;  Laterality: N/A;  . Tonsillectomy  ~ 1967  . Av fistula placement Right 11/01/2013    Procedure: RIGHT ARM ARTERIOVENOUS (AV) FISTULA CREATION;  Surgeon: Lawrence Hint, MD;  Location: [facility] OR;  Service: Vascular;  Laterality: Right;  . Abdominal aortagram N/A 05/03/2012    Procedure: ABDOMINAL Ronny Flurry;  Surgeon: Lawrence Kerns, MD;  [facility] C[facility] LAB;  Service: Cardiovascular;  Laterality: N/A;  . Lower extremity angiogram Right 05/03/2012    Procedure: LOWER EXTREMITY ANGIOGRAM;  Surgeon: Lawrence Kerns, MD;  Location: [facility] CATH LAB;  Service: Cardiovascular;  Laterality: Right;  rt  leg angio    No Known Allergies  Current Outpatient Prescriptions  Medication Sig Dispense Refill  . aspirin 81 MG tablet Take 1 tablet (81 mg total) by mouth daily. 30 tablet 6  . atorvastatin (LIPITOR) 40 MG tablet Take 1 tablet by mouth  daily 90 tablet 2  . Brinzolamide-Brimonidine (SIMBRINZA) 1-0.2 % SUSP Place 1 drop into the left eye 2 (two) times daily.    . calcitRIOL (ROCALTROL) 0.25 MCG capsule Take 1 capsule (0.25 mcg total) by mouth daily. 30 capsule 0  . gabapentin (NEURONTIN) 100 MG capsule Take 1 capsule by mouth 3  times daily 180 capsule 2  . Insulin Glargine (LANTUS SOLOSTAR) 100 UNIT/ML Solostar Pen Inject 15 Units into the skin daily at 10 pm. 15 mL 11  . Insulin Pen Needle (EASY TOUCH PEN NEEDLES) 31G X 8 MM MISC USE TO INJECT INSULIN once daily subcutaneously 100 each 4  . metoprolol tartrate (LOPRESSOR) 25 MG tablet Take 1 tablet by mouth two  times daily 180 tablet 2  . Misc. Devices [facility]) MISC Standard wheelchair. Use as needed for immobility 1 each 0  . solifenacin (VESICARE) 5 MG tablet Take 5 mg by mouth daily.    . Amino Acids-Protein Hydrolys (FEEDING SUPPLEMENT, PRO-STAT SUGAR FREE 64,) LIQD Take 30 mLs by mouth daily. Reported on 07/22/2015    . amLODipine (NORVASC) 5 MG tablet Take 1 tablet by mouth  daily (Patient not taking: Reported on 07/22/2015) 60 tablet 6  . cephALEXin (KEFLEX) 500 MG capsule Take 1 capsule (500 mg total) by mouth 3 (three) times daily. X 7 days (Patient not taking: Reported on 07/22/2015) 21 capsule 0  . ciprofloxacin (CIPRO) 500 MG tablet Take 1 tablet (500 mg total) by mouth 2 (two) times daily. X 14 dats (Patient not taking: Reported on 07/22/2015) 14 tablet 0  . docusate sodium (COLACE) 100 MG capsule Take 1 capsule (100 mg total) by mouth 2 (two) times daily. (Patient not taking: Reported on 07/22/2015) 30 capsule 0  . HYDROcodone-acetaminophen (NORCO/VICODIN) 5-325 MG tablet Take one tablet by mouth every 4 hours as needed for  moderate pain. Do not exceed 4gm of Tylenol in 24 hours (Patient not taking: Reported on 07/22/2015) 180 tablet 0  . hydrocortisone (ANUSOL-HC) 2.5 % rectal cream Apply rectally 2 times daily (Patient not taking: Reported on 07/22/2015) 28.35 g 0  . Hypromellose (ARTIFICIAL TEARS OP) Reported on 07/22/2015    . metroNIDAZOLE (FLAGYL) 500 MG tablet Take 1 tablet (500 mg total) by mouth 3 (three) times daily. X 10 days (Patient not taking: Reported on 07/22/2015) 30 tablet 0  . polyethylene glycol (MIRALAX / GLYCOLAX) packet Take 17 g by mouth daily. (Patient not taking: Reported on 07/22/2015) 14 each 0  . pramoxine (PROCTOFOAM) 1 % foam  Place rectally 3 (three) times daily as needed for itching, irritation or hemorrhoids. (Patient not taking: Reported on 07/22/2015) 15 g 0  . senna (SENOKOT) 8.6 MG tablet Take 2 tablets by mouth at bedtime. Reported on 07/22/2015    . sertraline (ZOLOFT) 25 MG tablet Take 25 mg by mouth daily. Reported on 07/22/2015    . ULTICARE INSULIN SYRINGE 30G X 1/2" 0.3 ML MISC USE ONE SYRINGE AT BEDTIME (Patient not taking: Reported on 07/22/2015) 100 each 0   No current facility-administered medications for this visit.     REVIEW OF SYSTEMS: see HPI for pertinent positives and negatives    PHYSICAL EXAMINATION:  Filed Vitals:   07/22/15 1455 07/22/15 1501  BP: 155/66 158/64  Pulse: 65 65  Temp: 98.3 F (36.8 C)   Resp: 16   Height: 5\' 5"  (1.651 m)   Weight: 170 lb (77.111 kg)   SpO2: 100%    Body mass index is 28.29 kg/(m^2).  General: The patient appears older and more frail than his stated age. He is seated in a wheelchair. HEENT:  No gross abnormalities Pulmonary: Respirations are non-labored. Abdomen: Soft and non-tender. Suprapubic catheter in place.  Musculoskeletal: Pt is unable to fully extend nor flex all of the fingers of his right hand. He has a right BKA. Neurologic: No focal weakness or paresthesias are detected.  Skin: There are no ulcer or rashes  noted. General pallor.  Psychiatric: The patient has normal affect. Cardiovascular: There is a regular rate and rhythm. Right upper arm AV fistula has a palpable thrill and audible bruit. No swelling or erythema.  His radial and ulnar pulses are not palpable bilaterally, but have brisk Doppler signals x 4.   Non-Invasive Vascular Imaging none  Medical Decision Making  Lawrence [name] is a 55 y.o. male who presents with stage 4 CKD, does not yet require hemodialysis.  He had a right brachiocephalic AV fistula placed on 16/11/9602.  Pt returns today with c/o AVF feels warm x 2 weeks, swollen x 1 week, painful since insertion of AVF. He reports fever and chills intermittently for 2 weeks. He has an appointment with Dr. Darrick Penna for the same reasons in 2 days. Pt states his nephrologist, Dr. Eliott Nine, told him he may have a clot in his AVF. He has trouble gripping his walker, he is able to propel his wheelchair with his hands. He only walks when physical therapist is with him.  He states he does not feel like he has a bladder infection lately.  Dr. Arbie Cookey spoke with and examined pt. Dr. Arbie Cookey will speak with Dr. Eliott Nine tomorrow as to the best approach to address this pt's steal sx's in his right arm: ligation of AVF vs. DRIL vs fistula gram, but contrast for fistula gram may put him in to total renal failure.  Will cancel appointment with Dr. Darrick Penna for 07/24/15; Dr. Arbie Cookey will contact pt after discussing options with Dr. Eliott Nine.     Claybon Jabs, Carma Lair, RN, MSN, FNP-C [facility] Office: [phone]  07/22/2015, 3:03 PM  Clinic MD: Early

## 2015-07-23 DIAGNOSIS — I130 Hypertensive heart and chronic kidney disease with heart failure and stage 1 through stage 4 chronic kidney disease, or unspecified chronic kidney disease: Secondary | ICD-10-CM | POA: Diagnosis not present

## 2015-07-23 DIAGNOSIS — Z435 Encounter for attention to cystostomy: Secondary | ICD-10-CM | POA: Diagnosis not present

## 2015-07-23 DIAGNOSIS — E1140 Type 2 diabetes mellitus with diabetic neuropathy, unspecified: Secondary | ICD-10-CM | POA: Diagnosis not present

## 2015-07-23 DIAGNOSIS — Z794 Long term (current) use of insulin: Secondary | ICD-10-CM | POA: Diagnosis not present

## 2015-07-23 DIAGNOSIS — R339 Retention of urine, unspecified: Secondary | ICD-10-CM | POA: Diagnosis not present

## 2015-07-23 DIAGNOSIS — E1151 Type 2 diabetes mellitus with diabetic peripheral angiopathy without gangrene: Secondary | ICD-10-CM | POA: Diagnosis not present

## 2015-07-23 DIAGNOSIS — I5032 Chronic diastolic (congestive) heart failure: Secondary | ICD-10-CM | POA: Diagnosis not present

## 2015-07-23 DIAGNOSIS — M5010 Cervical disc disorder with radiculopathy, unspecified cervical region: Secondary | ICD-10-CM | POA: Diagnosis not present

## 2015-07-23 DIAGNOSIS — E1122 Type 2 diabetes mellitus with diabetic chronic kidney disease: Secondary | ICD-10-CM | POA: Diagnosis not present

## 2015-07-23 DIAGNOSIS — N184 Chronic kidney disease, stage 4 (severe): Secondary | ICD-10-CM | POA: Diagnosis not present

## 2015-07-23 DIAGNOSIS — Z7982 Long term (current) use of aspirin: Secondary | ICD-10-CM | POA: Diagnosis not present

## 2015-07-23 DIAGNOSIS — E785 Hyperlipidemia, unspecified: Secondary | ICD-10-CM | POA: Diagnosis not present

## 2015-07-23 NOTE — Telephone Encounter (Signed)
CSW consult from CMA, patient called requesting an FL2. Patient states DSS has requested a completed FL2 in order for him to get Medicaid.    Call to patient to inquire about his request an FL2 is usually not required for a Medicaid application.  Patient states he is not going to assisted living, skilled nursing or applying for the in-home CAP program.  Patient states he is unsure why the FL2 is needed, stated his Medicaid worker told him she needed it.  Call to DSS spoke to Ms. Manson PasseyBrown [phone] and Ms. Lyman BishopLawrence [phone] , Adult Medicaid workers.  States patient does not need an FL2 as it relates to his Medicaid application. Patient only needs FL2 for placement or CAP program, however if it is determined that the FL2 is needed, they will CSW.  Direct phone number was provided.  Call to patient to explain the FL2 is not needed at this time.  Patient understood and states he will call CSW directly if anything changes.    [name]. LCSW Clinical Social Work, [facility]   [phone] 3:51 PM

## 2015-07-24 ENCOUNTER — Ambulatory Visit: Payer: Medicare Other | Admitting: Vascular Surgery

## 2015-07-24 DIAGNOSIS — I5032 Chronic diastolic (congestive) heart failure: Secondary | ICD-10-CM | POA: Diagnosis not present

## 2015-07-24 DIAGNOSIS — E1140 Type 2 diabetes mellitus with diabetic neuropathy, unspecified: Secondary | ICD-10-CM | POA: Diagnosis not present

## 2015-07-24 DIAGNOSIS — E785 Hyperlipidemia, unspecified: Secondary | ICD-10-CM | POA: Diagnosis not present

## 2015-07-24 DIAGNOSIS — M5010 Cervical disc disorder with radiculopathy, unspecified cervical region: Secondary | ICD-10-CM | POA: Diagnosis not present

## 2015-07-24 DIAGNOSIS — I130 Hypertensive heart and chronic kidney disease with heart failure and stage 1 through stage 4 chronic kidney disease, or unspecified chronic kidney disease: Secondary | ICD-10-CM | POA: Diagnosis not present

## 2015-07-24 DIAGNOSIS — N184 Chronic kidney disease, stage 4 (severe): Secondary | ICD-10-CM | POA: Diagnosis not present

## 2015-07-24 DIAGNOSIS — E1122 Type 2 diabetes mellitus with diabetic chronic kidney disease: Secondary | ICD-10-CM | POA: Diagnosis not present

## 2015-07-24 DIAGNOSIS — Z794 Long term (current) use of insulin: Secondary | ICD-10-CM | POA: Diagnosis not present

## 2015-07-24 DIAGNOSIS — Z7982 Long term (current) use of aspirin: Secondary | ICD-10-CM | POA: Diagnosis not present

## 2015-07-24 DIAGNOSIS — R339 Retention of urine, unspecified: Secondary | ICD-10-CM | POA: Diagnosis not present

## 2015-07-24 DIAGNOSIS — E1151 Type 2 diabetes mellitus with diabetic peripheral angiopathy without gangrene: Secondary | ICD-10-CM | POA: Diagnosis not present

## 2015-07-24 DIAGNOSIS — Z435 Encounter for attention to cystostomy: Secondary | ICD-10-CM | POA: Diagnosis not present

## 2015-07-25 DIAGNOSIS — E785 Hyperlipidemia, unspecified: Secondary | ICD-10-CM | POA: Diagnosis not present

## 2015-07-25 DIAGNOSIS — Z435 Encounter for attention to cystostomy: Secondary | ICD-10-CM | POA: Diagnosis not present

## 2015-07-25 DIAGNOSIS — Z7982 Long term (current) use of aspirin: Secondary | ICD-10-CM | POA: Diagnosis not present

## 2015-07-25 DIAGNOSIS — I130 Hypertensive heart and chronic kidney disease with heart failure and stage 1 through stage 4 chronic kidney disease, or unspecified chronic kidney disease: Secondary | ICD-10-CM | POA: Diagnosis not present

## 2015-07-25 DIAGNOSIS — G629 Polyneuropathy, unspecified: Secondary | ICD-10-CM | POA: Diagnosis not present

## 2015-07-25 DIAGNOSIS — E1151 Type 2 diabetes mellitus with diabetic peripheral angiopathy without gangrene: Secondary | ICD-10-CM | POA: Diagnosis not present

## 2015-07-25 DIAGNOSIS — Z794 Long term (current) use of insulin: Secondary | ICD-10-CM | POA: Diagnosis not present

## 2015-07-25 DIAGNOSIS — M5010 Cervical disc disorder with radiculopathy, unspecified cervical region: Secondary | ICD-10-CM | POA: Diagnosis not present

## 2015-07-25 DIAGNOSIS — N184 Chronic kidney disease, stage 4 (severe): Secondary | ICD-10-CM | POA: Diagnosis not present

## 2015-07-25 DIAGNOSIS — I5032 Chronic diastolic (congestive) heart failure: Secondary | ICD-10-CM | POA: Diagnosis not present

## 2015-07-25 DIAGNOSIS — E1140 Type 2 diabetes mellitus with diabetic neuropathy, unspecified: Secondary | ICD-10-CM | POA: Diagnosis not present

## 2015-07-25 DIAGNOSIS — E1122 Type 2 diabetes mellitus with diabetic chronic kidney disease: Secondary | ICD-10-CM | POA: Diagnosis not present

## 2015-07-25 DIAGNOSIS — R339 Retention of urine, unspecified: Secondary | ICD-10-CM | POA: Diagnosis not present

## 2015-07-28 ENCOUNTER — Other Ambulatory Visit (HOSPITAL_COMMUNITY): Payer: Self-pay | Admitting: *Deleted

## 2015-07-29 ENCOUNTER — Ambulatory Visit (HOSPITAL_COMMUNITY)
Admission: RE | Admit: 2015-07-29 | Discharge: 2015-07-29 | Disposition: A | Discharge status: Home / Self Care | Payer: Medicare Other | Source: Ambulatory Visit | Attending: Nephrology | Admitting: Nephrology

## 2015-07-29 ENCOUNTER — Telehealth: Payer: Self-pay | Admitting: Family Medicine

## 2015-07-29 DIAGNOSIS — E785 Hyperlipidemia, unspecified: Secondary | ICD-10-CM | POA: Diagnosis not present

## 2015-07-29 DIAGNOSIS — D509 Iron deficiency anemia, unspecified: Secondary | ICD-10-CM | POA: Insufficient documentation

## 2015-07-29 DIAGNOSIS — E1140 Type 2 diabetes mellitus with diabetic neuropathy, unspecified: Secondary | ICD-10-CM | POA: Diagnosis not present

## 2015-07-29 DIAGNOSIS — I5032 Chronic diastolic (congestive) heart failure: Secondary | ICD-10-CM | POA: Diagnosis not present

## 2015-07-29 DIAGNOSIS — E1151 Type 2 diabetes mellitus with diabetic peripheral angiopathy without gangrene: Secondary | ICD-10-CM | POA: Diagnosis not present

## 2015-07-29 DIAGNOSIS — E1122 Type 2 diabetes mellitus with diabetic chronic kidney disease: Secondary | ICD-10-CM | POA: Diagnosis not present

## 2015-07-29 DIAGNOSIS — Z7982 Long term (current) use of aspirin: Secondary | ICD-10-CM | POA: Diagnosis not present

## 2015-07-29 DIAGNOSIS — I130 Hypertensive heart and chronic kidney disease with heart failure and stage 1 through stage 4 chronic kidney disease, or unspecified chronic kidney disease: Secondary | ICD-10-CM | POA: Diagnosis not present

## 2015-07-29 DIAGNOSIS — R339 Retention of urine, unspecified: Secondary | ICD-10-CM | POA: Diagnosis not present

## 2015-07-29 DIAGNOSIS — N184 Chronic kidney disease, stage 4 (severe): Secondary | ICD-10-CM | POA: Diagnosis not present

## 2015-07-29 DIAGNOSIS — Z794 Long term (current) use of insulin: Secondary | ICD-10-CM | POA: Diagnosis not present

## 2015-07-29 DIAGNOSIS — Z435 Encounter for attention to cystostomy: Secondary | ICD-10-CM | POA: Diagnosis not present

## 2015-07-29 DIAGNOSIS — M5010 Cervical disc disorder with radiculopathy, unspecified cervical region: Secondary | ICD-10-CM | POA: Diagnosis not present

## 2015-07-29 MED ORDER — SODIUM CHLORIDE 0.9 % IV SOLN
510.0000 mg | Freq: Once | INTRAVENOUS | Status: AC
  Administered 2015-07-29: 510 mg via INTRAVENOUS
  Filled 2015-07-29: qty 17

## 2015-07-29 NOTE — Telephone Encounter (Signed)
Pt needs RX for Baby aspirin sent to his mail order pharmacy, [facility]. 90 day supply

## 2015-07-30 ENCOUNTER — Telehealth: Payer: Self-pay | Admitting: Family Medicine

## 2015-07-30 DIAGNOSIS — E1122 Type 2 diabetes mellitus with diabetic chronic kidney disease: Secondary | ICD-10-CM | POA: Diagnosis not present

## 2015-07-30 DIAGNOSIS — Z435 Encounter for attention to cystostomy: Secondary | ICD-10-CM | POA: Diagnosis not present

## 2015-07-30 DIAGNOSIS — I5032 Chronic diastolic (congestive) heart failure: Secondary | ICD-10-CM | POA: Diagnosis not present

## 2015-07-30 DIAGNOSIS — E785 Hyperlipidemia, unspecified: Secondary | ICD-10-CM | POA: Diagnosis not present

## 2015-07-30 DIAGNOSIS — M5010 Cervical disc disorder with radiculopathy, unspecified cervical region: Secondary | ICD-10-CM | POA: Diagnosis not present

## 2015-07-30 DIAGNOSIS — N184 Chronic kidney disease, stage 4 (severe): Secondary | ICD-10-CM | POA: Diagnosis not present

## 2015-07-30 DIAGNOSIS — E1140 Type 2 diabetes mellitus with diabetic neuropathy, unspecified: Secondary | ICD-10-CM | POA: Diagnosis not present

## 2015-07-30 DIAGNOSIS — Z794 Long term (current) use of insulin: Secondary | ICD-10-CM | POA: Diagnosis not present

## 2015-07-30 DIAGNOSIS — E1151 Type 2 diabetes mellitus with diabetic peripheral angiopathy without gangrene: Secondary | ICD-10-CM | POA: Diagnosis not present

## 2015-07-30 DIAGNOSIS — I130 Hypertensive heart and chronic kidney disease with heart failure and stage 1 through stage 4 chronic kidney disease, or unspecified chronic kidney disease: Secondary | ICD-10-CM | POA: Diagnosis not present

## 2015-07-30 DIAGNOSIS — Z7982 Long term (current) use of aspirin: Secondary | ICD-10-CM | POA: Diagnosis not present

## 2015-07-30 DIAGNOSIS — R339 Retention of urine, unspecified: Secondary | ICD-10-CM | POA: Diagnosis not present

## 2015-07-30 NOTE — Telephone Encounter (Signed)
Pt called because his online pharmacy said that they never received a e-script from us for his 81 mg Asprin on 07/14/15. Can we send this in again for the patient. jw

## 2015-07-31 ENCOUNTER — Emergency Department (HOSPITAL_COMMUNITY)
Admission: EM | Admit: 2015-07-31 | Discharge: 2015-08-01 | Disposition: A | Discharge status: Home / Self Care | Payer: Medicare Other | Attending: Emergency Medicine | Admitting: Emergency Medicine

## 2015-07-31 ENCOUNTER — Telehealth: Payer: Self-pay | Admitting: Family Medicine

## 2015-07-31 ENCOUNTER — Encounter (HOSPITAL_COMMUNITY): Payer: Self-pay | Admitting: Family Medicine

## 2015-07-31 DIAGNOSIS — Z7982 Long term (current) use of aspirin: Secondary | ICD-10-CM | POA: Diagnosis not present

## 2015-07-31 DIAGNOSIS — I129 Hypertensive chronic kidney disease with stage 1 through stage 4 chronic kidney disease, or unspecified chronic kidney disease: Secondary | ICD-10-CM | POA: Diagnosis not present

## 2015-07-31 DIAGNOSIS — N189 Chronic kidney disease, unspecified: Secondary | ICD-10-CM | POA: Insufficient documentation

## 2015-07-31 DIAGNOSIS — I5032 Chronic diastolic (congestive) heart failure: Secondary | ICD-10-CM | POA: Diagnosis not present

## 2015-07-31 DIAGNOSIS — Z794 Long term (current) use of insulin: Secondary | ICD-10-CM | POA: Insufficient documentation

## 2015-07-31 DIAGNOSIS — Z435 Encounter for attention to cystostomy: Secondary | ICD-10-CM | POA: Diagnosis not present

## 2015-07-31 DIAGNOSIS — E1151 Type 2 diabetes mellitus with diabetic peripheral angiopathy without gangrene: Secondary | ICD-10-CM | POA: Diagnosis not present

## 2015-07-31 DIAGNOSIS — E119 Type 2 diabetes mellitus without complications: Secondary | ICD-10-CM | POA: Insufficient documentation

## 2015-07-31 DIAGNOSIS — R1084 Generalized abdominal pain: Secondary | ICD-10-CM

## 2015-07-31 DIAGNOSIS — M5010 Cervical disc disorder with radiculopathy, unspecified cervical region: Secondary | ICD-10-CM | POA: Diagnosis not present

## 2015-07-31 DIAGNOSIS — R109 Unspecified abdominal pain: Secondary | ICD-10-CM | POA: Diagnosis present

## 2015-07-31 DIAGNOSIS — Z79899 Other long term (current) drug therapy: Secondary | ICD-10-CM | POA: Insufficient documentation

## 2015-07-31 DIAGNOSIS — E785 Hyperlipidemia, unspecified: Secondary | ICD-10-CM | POA: Diagnosis not present

## 2015-07-31 DIAGNOSIS — E1140 Type 2 diabetes mellitus with diabetic neuropathy, unspecified: Secondary | ICD-10-CM | POA: Diagnosis not present

## 2015-07-31 DIAGNOSIS — N184 Chronic kidney disease, stage 4 (severe): Secondary | ICD-10-CM | POA: Diagnosis not present

## 2015-07-31 DIAGNOSIS — I130 Hypertensive heart and chronic kidney disease with heart failure and stage 1 through stage 4 chronic kidney disease, or unspecified chronic kidney disease: Secondary | ICD-10-CM | POA: Diagnosis not present

## 2015-07-31 DIAGNOSIS — R339 Retention of urine, unspecified: Secondary | ICD-10-CM | POA: Diagnosis not present

## 2015-07-31 DIAGNOSIS — E1122 Type 2 diabetes mellitus with diabetic chronic kidney disease: Secondary | ICD-10-CM | POA: Diagnosis not present

## 2015-07-31 LAB — CBC WITH DIFFERENTIAL/PLATELET
BASOS ABS: 0 10*3/uL (ref 0.0–0.1)
BASOS PCT: 0 %
EOS PCT: 4 %
Eosinophils Absolute: 0.2 10*3/uL (ref 0.0–0.7)
HEMATOCRIT: 28.5 % — AB (ref 39.0–52.0)
Hemoglobin: 8.8 g/dL — ABNORMAL LOW (ref 13.0–17.0)
LYMPHS PCT: 28 %
Lymphs Abs: 1.7 10*3/uL (ref 0.7–4.0)
MCH: 26 pg (ref 26.0–34.0)
MCHC: 30.9 g/dL (ref 30.0–36.0)
MCV: 84.1 fL (ref 78.0–100.0)
MONO ABS: 0.4 10*3/uL (ref 0.1–1.0)
Monocytes Relative: 6 %
NEUTROS ABS: 3.9 10*3/uL (ref 1.7–7.7)
Neutrophils Relative %: 62 %
PLATELETS: 154 10*3/uL (ref 150–400)
RBC: 3.39 MIL/uL — AB (ref 4.22–5.81)
RDW: 14.9 % (ref 11.5–15.5)
WBC: 6.3 10*3/uL (ref 4.0–10.5)

## 2015-07-31 LAB — COMPREHENSIVE METABOLIC PANEL
ALBUMIN: 2.6 g/dL — AB (ref 3.5–5.0)
ALT: 9 U/L — AB (ref 17–63)
AST: 12 U/L — AB (ref 15–41)
Alkaline Phosphatase: 101 U/L (ref 38–126)
Anion gap: 6 (ref 5–15)
BILIRUBIN TOTAL: 0.5 mg/dL (ref 0.3–1.2)
BUN: 49 mg/dL — AB (ref 6–20)
CALCIUM: 8.8 mg/dL — AB (ref 8.9–10.3)
CO2: 23 mmol/L (ref 22–32)
Chloride: 111 mmol/L (ref 101–111)
Creatinine, Ser: 2.96 mg/dL — ABNORMAL HIGH (ref 0.61–1.24)
GFR calc Af Amer: 26 mL/min — ABNORMAL LOW (ref >60)
GFR, EST NON AFRICAN AMERICAN: 22 mL/min — AB (ref >60)
GLUCOSE: 91 mg/dL (ref 65–99)
Potassium: 3.9 mmol/L (ref 3.5–5.1)
Sodium: 140 mmol/L (ref 135–145)
TOTAL PROTEIN: 5.8 g/dL — AB (ref 6.5–8.1)

## 2015-07-31 LAB — URINALYSIS, ROUTINE W REFLEX MICROSCOPIC
BILIRUBIN URINE: NEGATIVE
GLUCOSE, UA: NEGATIVE mg/dL
KETONES UR: NEGATIVE mg/dL
Nitrite: NEGATIVE
PH: 5 (ref 5.0–8.0)
Protein, ur: 100 mg/dL — AB
Specific Gravity, Urine: 1.025 (ref 1.005–1.030)

## 2015-07-31 LAB — URINE MICROSCOPIC-ADD ON

## 2015-07-31 MED ORDER — ASPIRIN 81 MG PO TABS: 81.0000 mg | ORAL_TABLET | Freq: Every day | ORAL | Status: AC

## 2015-07-31 NOTE — ED Notes (Signed)
EDP at bedside  

## 2015-07-31 NOTE — Discharge Instructions (Signed)
As discussed, your evaluation today has been largely reassuring.  But, it is important that you monitor your condition carefully, and do not hesitate to return to the ED if you develop new, or concerning changes in your condition. ° °Otherwise, please follow-up with your physician for appropriate ongoing care. ° ° °Abdominal Pain, Adult °Many things can cause abdominal pain. Usually, abdominal pain is not caused by a disease and will improve without treatment. It can often be observed and treated at home. Your health care provider will do a physical exam and possibly order blood tests and X-rays to help determine the seriousness of your pain. However, in many cases, more time must pass before a clear cause of the pain can be found. Before that point, your health care provider may not know if you need more testing or further treatment. °HOME CARE INSTRUCTIONS °Monitor your abdominal pain for any changes. The following actions may help to alleviate any discomfort you are experiencing: °· Only take over-the-counter or prescription medicines as directed by your health care provider. °· Do not take laxatives unless directed to do so by your health care provider. °· Try a clear liquid diet (broth, tea, or water) as directed by your health care provider. Slowly move to a bland diet as tolerated. °SEEK MEDICAL CARE IF: °· You have unexplained abdominal pain. °· You have abdominal pain associated with nausea or diarrhea. °· You have pain when you urinate or have a bowel movement. °· You experience abdominal pain that wakes you in the night. °· You have abdominal pain that is worsened or improved by eating food. °· You have abdominal pain that is worsened with eating fatty foods. °· You have a fever. °SEEK IMMEDIATE MEDICAL CARE IF: °· Your pain does not go away within 2 hours. °· You keep throwing up (vomiting). °· Your pain is felt only in portions of the abdomen, such as the right side or the left lower portion of the  abdomen. °· You pass bloody or black tarry stools. °MAKE SURE YOU: °· Understand these instructions. °· Will watch your condition. °· Will get help right away if you are not doing well or get worse. °  °This information is not intended to replace advice given to you by your health care provider. Make sure you discuss any questions you have with your health care provider. °  °Document Released: 11/04/2004 Document Revised: 10/16/2014 Document Reviewed: 10/04/2012 °Elsevier Interactive Patient Education ©2016 Elsevier Inc[employer] ° ° °

## 2015-07-31 NOTE — ED Notes (Signed)
Pt states he does not have ride home, pt has wheelchair with him, [facility] will not transport home with wheelchair. Charge RN aware

## 2015-07-31 NOTE — Telephone Encounter (Signed)
Pt is calling because his stomach is swollen and very hot to the touch. I suggested that he go to the ED or come in here. He would like to speak to the doctor before going through all of that. Please call ASAP. jw

## 2015-07-31 NOTE — ED Notes (Signed)
Pt here for swollen and tender area to RLQ that is warm to touch. No redness noted. sts some diarrhea.

## 2015-07-31 NOTE — Telephone Encounter (Signed)
Appreicate everyone tracking this down.

## 2015-07-31 NOTE — ED Notes (Signed)
Pt given coffee at RN's request.  

## 2015-07-31 NOTE — Telephone Encounter (Signed)
Informed by Annice PihJackie that patient is actually now on his way to ED.  Agree with this plan.

## 2015-07-31 NOTE — ED Provider Notes (Signed)
CSN: [id number]     Arrival date & time 07/31/15  1654 History   First MD Initiated Contact with Patient 07/31/15 2108     Chief Complaint  Patient presents with  . Abdominal Pain     HPI  Patient presents with abdominal pain. Patient has had pain for at least several days, if not several weeks. There is associated enlargement of the abdomen, but no distention. Patient denies any new nausea, vomiting, diarrhea. He also denies any fever, chills, drainage or discharge. There is some concern from caregivers about possible warm area, but patient self cannot specify where that might be, and denies any discoloration per He does have a history of chronic suprapubic catheter, notes it was changed yesterday, denies drainage from that area. After speaking with his physicians was advised to come here for evaluation.   Past Medical History  Diagnosis Date  . Hypertension   . Chronic kidney disease   . Hypercholesteremia   . PONV (postoperative nausea and vomiting)   . Peripheral vascular disease (HCC)   . H/O hiatal hernia     hx of years ago   . Pneumonia 10/23/2013  . IDDM (insulin dependent diabetes mellitus) (HCC)   . Depression 08/2012    "just when foot first got cut off"  . Fistula    Past Surgical History  Procedure Laterality Date  . Amputation Right 05/03/2012    Procedure: Right First Ray AMPUTATION;  Surgeon: Eldred MangesMark C Yates, MD;  [facility];  Service: Orthopedics;  Laterality: Right;  . I&d extremity Right 05/29/2012    Procedure: IRRIGATION AND DEBRIDEMENT EXTREMITY- right foot;  Surgeon: Eldred MangesMark C Yates, MD;  Location: [facility] OR;  Service: Orthopedics;  Laterality: Right;  Right Foot Debridement, VAC Application  . Amputation Right 08/16/2012    Procedure: AMPUTATION BELOW KNEE;  Surgeon: Eldred MangesMark C Yates, MD;  [facility];  Service: Orthopedics;  Laterality: Right;  Right Below Knee Amputation  . Amputation Right 10/04/2012    Procedure: AMPUTATION BELOW KNEE REVISION;  Surgeon:  Eldred MangesMark C Yates, MD;  Location: [facility] OR;  Service: Orthopedics;  Laterality: Right;  . Insertion of suprapubic catheter N/A 03/02/2013    Procedure: INSERTION OF SUPRAPUBIC CATHETER;  Surgeon: Su GrandMarc Nesi, MD;  Location: [facility] ORS;  Service: Urology;  Laterality: N/A;  . Tonsillectomy  ~ 1967  . Av fistula placement Right 11/01/2013    Procedure: RIGHT ARM ARTERIOVENOUS (AV) FISTULA CREATION;  Surgeon: Chuck Hinthristopher S Dickson, MD;  [facility];  Service: Vascular;  Laterality: Right;  . Abdominal aortagram N/A 05/03/2012    Procedure: ABDOMINAL Ronny FlurryAORTAGRAM;  Surgeon: Sherren Kernsharles E Fields, MD;  [facility];  Service: Cardiovascular;  Laterality: N/A;  . Lower extremity angiogram Right 05/03/2012    Procedure: LOWER EXTREMITY ANGIOGRAM;  Surgeon: Sherren Kernsharles E Fields, MD;  [facility];  Service: Cardiovascular;  Laterality: Right;  rt leg angio   Family History  Problem Relation Age of Onset  . Stroke Mother   . Cancer Father     Unsure of type   Social History  Substance Use Topics  . Smoking status: Never Smoker   . Smokeless tobacco: Never Used  . Alcohol Use: No    Review of Systems  Constitutional:       Per HPI, otherwise negative  HENT:       Per HPI, otherwise negative  Respiratory:       Per HPI, otherwise negative  Cardiovascular:  Per HPI, otherwise negative  Gastrointestinal: Negative for vomiting.  Endocrine:       Negative aside from HPI  Genitourinary:       Neg aside from HPI   Musculoskeletal:       Per HPI, otherwise negative  Skin: Negative.   Neurological: Negative for syncope.      Allergies  Review of patient's allergies indicates no known allergies.  Home Medications   Prior to Admission medications   Medication Sig Start Date End Date Taking? Authorizing Provider  aspirin 81 MG tablet Take 1 tablet (81 mg total) by mouth daily. Patient taking differently: Take 81 mg by mouth every morning.  07/31/15  Yes Tobey Grim, MD  atorvastatin  (LIPITOR) 40 MG tablet Take 1 tablet by mouth  daily 06/15/15  Yes Tobey Grim, MD  bisacodyl (DULCOLAX) 5 MG EC tablet Take 5 mg by mouth daily as needed for moderate constipation.   Yes Historical Provider, MD  Brinzolamide-Brimonidine [facility]) 1-0.2 % SUSP Place 1 drop into the left eye 2 (two) times daily.   Yes Historical Provider, MD  calcitRIOL (ROCALTROL) 0.25 MCG capsule Take 1 capsule (0.25 mcg total) by mouth daily. 11/01/13  Yes Ardith Dark, MD  furosemide (LASIX) 40 MG tablet Take 40 mg by mouth 2 (two) times daily. 07/11/15  Yes Historical Provider, MD  gabapentin (NEURONTIN) 100 MG capsule Take 1 capsule by mouth 3  times daily 07/16/15  Yes Tobey Grim, MD  Insulin Glargine (LANTUS SOLOSTAR) 100 UNIT/ML Solostar Pen Inject 15 Units into the skin daily at 10 pm. 06/10/15  Yes Tobey Grim, MD  Insulin Pen Needle (EASY TOUCH PEN NEEDLES) 31G X 8 MM MISC USE TO INJECT INSULIN once daily subcutaneously 06/05/15  Yes Tobey Grim, MD  metoprolol tartrate (LOPRESSOR) 25 MG tablet Take 1 tablet by mouth two  times daily 06/15/15  Yes Tobey Grim, MD  Misc. Devices [facility]) MISC Standard wheelchair. Use as needed for immobility 07/03/15  Yes Latrelle Dodrill, MD  solifenacin (VESICARE) 5 MG tablet Take 5 mg by mouth daily.   Yes Historical Provider, MD  Stann Ore INSULIN SYRINGE 30G X 1/2" 0.3 ML MISC USE ONE SYRINGE AT BEDTIME 11/15/13  Yes [name], MD   BP 154/45 mmHg  Pulse 65  Temp(Src) 98 F (36.7 C) (Oral)  Resp 16  SpO2 100% Physical Exam  Constitutional: He is oriented to person, place, and time. He appears well-developed. No distress.  HENT:  Head: Normocephalic and atraumatic.  Eyes: Conjunctivae and EOM are normal.  Cardiovascular: Normal rate and regular rhythm.   Pulmonary/Chest: Effort normal. No stridor. No respiratory distress.  Abdominal: He exhibits no distension.  Large soft abdomen, nontender, no discernible cutaneous lesions. No  warmth, There is a suprapubic catheter in place, this is unremarkable in appearance.   Musculoskeletal: He exhibits no edema.  Neurological: He is alert and oriented to person, place, and time. He displays atrophy. No cranial nerve deficit or sensory deficit. He exhibits abnormal muscle tone. He displays no seizure activity.  Skin: Skin is warm and dry.  Psychiatric: He has a normal mood and affect.  Nursing note and vitals reviewed.   ED Course  Procedures (including critical care time) Labs Review Labs Reviewed  CBC WITH DIFFERENTIAL/PLATELET - Abnormal; Notable for the following:    RBC 3.39 (*)    Hemoglobin 8.8 (*)    HCT 28.5 (*)    All other components within normal  limits  COMPREHENSIVE METABOLIC PANEL - Abnormal; Notable for the following:    BUN 49 (*)    Creatinine, Ser 2.96 (*)    Calcium 8.8 (*)    Total Protein 5.8 (*)    Albumin 2.6 (*)    AST 12 (*)    ALT 9 (*)    GFR calc non Af Amer 22 (*)    GFR calc Af Amer 26 (*)    All other components within normal limits  URINALYSIS, ROUTINE W REFLEX MICROSCOPIC (NOT AT [facility]) - Abnormal; Notable for the following:    Hgb urine dipstick MODERATE (*)    Protein, ur 100 (*)    Leukocytes, UA SMALL (*)    All other components within normal limits  URINE MICROSCOPIC-ADD ON - Abnormal; Notable for the following:    Squamous Epithelial / LPF 0-5 (*)    Bacteria, UA RARE (*)    Casts GRANULAR CAST (*)    All other components within normal limits    Patient is afebrile, labs unremarkable.  Repeat exam he is in no distress, I discussed all findings, the patient will follow up with his primary care team.   MDM  Patient presents with concern for possible enlarged abdomen, warm area. Here he is awake, alert, in no distress, with this large, but soft, non-peritoneal abdomen, no drainage, bleeding, discharge, cutaneous lesions concerning for cellulitis. Patient does have a suprapubic catheter, though this is unremarkable as  well. With reassuring labs, no tenderness to palpation, no endorsement of nausea, vomiting, diarrhea, no imaging is indicated. Patient discharged in stable condition to follow-up with primary care.   Gerhard Munchobert Goldye Tourangeau, MD 07/31/15 2320

## 2015-07-31 NOTE — Telephone Encounter (Signed)
Done in another note °

## 2015-08-01 ENCOUNTER — Telehealth: Payer: Self-pay | Admitting: Family Medicine

## 2015-08-01 NOTE — Telephone Encounter (Signed)
Pt is calling and would like to know what his nerve test results were. Please call to discuss. jw

## 2015-08-01 NOTE — ED Notes (Signed)
Pt states his ride will be here around 0730

## 2015-08-04 ENCOUNTER — Telehealth: Payer: Self-pay | Admitting: Family Medicine

## 2015-08-04 DIAGNOSIS — Z435 Encounter for attention to cystostomy: Secondary | ICD-10-CM | POA: Diagnosis not present

## 2015-08-04 DIAGNOSIS — N184 Chronic kidney disease, stage 4 (severe): Secondary | ICD-10-CM | POA: Diagnosis not present

## 2015-08-04 DIAGNOSIS — E1151 Type 2 diabetes mellitus with diabetic peripheral angiopathy without gangrene: Secondary | ICD-10-CM | POA: Diagnosis not present

## 2015-08-04 DIAGNOSIS — E1122 Type 2 diabetes mellitus with diabetic chronic kidney disease: Secondary | ICD-10-CM | POA: Diagnosis not present

## 2015-08-04 DIAGNOSIS — R339 Retention of urine, unspecified: Secondary | ICD-10-CM | POA: Diagnosis not present

## 2015-08-04 DIAGNOSIS — I5032 Chronic diastolic (congestive) heart failure: Secondary | ICD-10-CM | POA: Diagnosis not present

## 2015-08-04 DIAGNOSIS — Z794 Long term (current) use of insulin: Secondary | ICD-10-CM | POA: Diagnosis not present

## 2015-08-04 DIAGNOSIS — E785 Hyperlipidemia, unspecified: Secondary | ICD-10-CM | POA: Diagnosis not present

## 2015-08-04 DIAGNOSIS — Z7982 Long term (current) use of aspirin: Secondary | ICD-10-CM | POA: Diagnosis not present

## 2015-08-04 DIAGNOSIS — I130 Hypertensive heart and chronic kidney disease with heart failure and stage 1 through stage 4 chronic kidney disease, or unspecified chronic kidney disease: Secondary | ICD-10-CM | POA: Diagnosis not present

## 2015-08-04 DIAGNOSIS — E1140 Type 2 diabetes mellitus with diabetic neuropathy, unspecified: Secondary | ICD-10-CM | POA: Diagnosis not present

## 2015-08-04 DIAGNOSIS — M5010 Cervical disc disorder with radiculopathy, unspecified cervical region: Secondary | ICD-10-CM | POA: Diagnosis not present

## 2015-08-04 NOTE — Telephone Encounter (Signed)
Pt is calling back and would like to know if his Nerve test results came in. jw

## 2015-08-04 NOTE — Telephone Encounter (Signed)
Called but no answer.  Left voicemail.  In short, told Lawrence [name] that I cannot see the results of his nerve conduction studies and that he would need to call his neurologist or whoever ordered the test to get these results.  JW

## 2015-08-04 NOTE — Telephone Encounter (Signed)
Pt called back and wanted to see if we can request his nerve study to be sent here to us. jw

## 2015-08-05 ENCOUNTER — Other Ambulatory Visit: Payer: Self-pay

## 2015-08-06 DIAGNOSIS — R339 Retention of urine, unspecified: Secondary | ICD-10-CM | POA: Diagnosis not present

## 2015-08-06 DIAGNOSIS — E1151 Type 2 diabetes mellitus with diabetic peripheral angiopathy without gangrene: Secondary | ICD-10-CM | POA: Diagnosis not present

## 2015-08-06 DIAGNOSIS — E785 Hyperlipidemia, unspecified: Secondary | ICD-10-CM | POA: Diagnosis not present

## 2015-08-06 DIAGNOSIS — I130 Hypertensive heart and chronic kidney disease with heart failure and stage 1 through stage 4 chronic kidney disease, or unspecified chronic kidney disease: Secondary | ICD-10-CM | POA: Diagnosis not present

## 2015-08-06 DIAGNOSIS — M5010 Cervical disc disorder with radiculopathy, unspecified cervical region: Secondary | ICD-10-CM | POA: Diagnosis not present

## 2015-08-06 DIAGNOSIS — E1140 Type 2 diabetes mellitus with diabetic neuropathy, unspecified: Secondary | ICD-10-CM | POA: Diagnosis not present

## 2015-08-06 DIAGNOSIS — Z794 Long term (current) use of insulin: Secondary | ICD-10-CM | POA: Diagnosis not present

## 2015-08-06 DIAGNOSIS — I5032 Chronic diastolic (congestive) heart failure: Secondary | ICD-10-CM | POA: Diagnosis not present

## 2015-08-06 DIAGNOSIS — N184 Chronic kidney disease, stage 4 (severe): Secondary | ICD-10-CM | POA: Diagnosis not present

## 2015-08-06 DIAGNOSIS — E1122 Type 2 diabetes mellitus with diabetic chronic kidney disease: Secondary | ICD-10-CM | POA: Diagnosis not present

## 2015-08-06 DIAGNOSIS — Z7982 Long term (current) use of aspirin: Secondary | ICD-10-CM | POA: Diagnosis not present

## 2015-08-06 DIAGNOSIS — Z435 Encounter for attention to cystostomy: Secondary | ICD-10-CM | POA: Diagnosis not present

## 2015-08-07 ENCOUNTER — Encounter (HOSPITAL_COMMUNITY): Payer: Self-pay | Admitting: *Deleted

## 2015-08-07 DIAGNOSIS — M5010 Cervical disc disorder with radiculopathy, unspecified cervical region: Secondary | ICD-10-CM | POA: Diagnosis not present

## 2015-08-07 DIAGNOSIS — E1140 Type 2 diabetes mellitus with diabetic neuropathy, unspecified: Secondary | ICD-10-CM | POA: Diagnosis not present

## 2015-08-07 DIAGNOSIS — Z794 Long term (current) use of insulin: Secondary | ICD-10-CM | POA: Diagnosis not present

## 2015-08-07 DIAGNOSIS — N184 Chronic kidney disease, stage 4 (severe): Secondary | ICD-10-CM | POA: Diagnosis not present

## 2015-08-07 DIAGNOSIS — E785 Hyperlipidemia, unspecified: Secondary | ICD-10-CM | POA: Diagnosis not present

## 2015-08-07 DIAGNOSIS — Z435 Encounter for attention to cystostomy: Secondary | ICD-10-CM | POA: Diagnosis not present

## 2015-08-07 DIAGNOSIS — E1122 Type 2 diabetes mellitus with diabetic chronic kidney disease: Secondary | ICD-10-CM | POA: Diagnosis not present

## 2015-08-07 DIAGNOSIS — E1151 Type 2 diabetes mellitus with diabetic peripheral angiopathy without gangrene: Secondary | ICD-10-CM | POA: Diagnosis not present

## 2015-08-07 DIAGNOSIS — I5032 Chronic diastolic (congestive) heart failure: Secondary | ICD-10-CM | POA: Diagnosis not present

## 2015-08-07 DIAGNOSIS — R339 Retention of urine, unspecified: Secondary | ICD-10-CM | POA: Diagnosis not present

## 2015-08-07 DIAGNOSIS — Z7982 Long term (current) use of aspirin: Secondary | ICD-10-CM | POA: Diagnosis not present

## 2015-08-07 DIAGNOSIS — I130 Hypertensive heart and chronic kidney disease with heart failure and stage 1 through stage 4 chronic kidney disease, or unspecified chronic kidney disease: Secondary | ICD-10-CM | POA: Diagnosis not present

## 2015-08-07 MED ORDER — DEXTROSE 5 % IV SOLN
1.5000 g | INTRAVENOUS | Status: DC
  Filled 2015-08-07: qty 1.5

## 2015-08-07 NOTE — Telephone Encounter (Signed)
Contacted [facility] and they will be faxing over the notes from the nerve study and will place in PCP box for review. Forwarding to PCP as an FYI so he can keep and eye out for these. Lawrence [name], Laquinda Moller D, [place]

## 2015-08-07 NOTE — Progress Notes (Signed)
Anesthesia Chart Review:  Pt is a 55 year old male scheduled for ligation of R brachiocephalic AV fistula on 08/08/2015 with Josephina GipJames Lawson, MD.   Pt is a same day work up.   PMH includes:  CHF, HTN, DM, hyperlipidemia, CKD (stage IV, not yet on dialysis), PVD, post-op N/V, GERD. Never smoker. BMI 28  Medications include: ASA, lipitor, lasix, lantus, metoprolol  Labs 07/31/15 reviewed.  - H/H 8.8/28.5. Hgb has ranged 9.1-11.9 over last 6 months.  - Cr 2.96, BUN 49,slightly higher than baseline 2.3-2.6.   EKG 03/12/15: Sinus rhythm with 1st degree A-V block. Left axis deviation. Nonspecific ST and T wave abnormality  Echo 10/24/13:  - Left ventricle: The cavity size was normal. Wall thickness was normal. The estimated ejection fraction was 50%. Images were inadequate for LV wall motion assessment. Features are consistent with a pseudonormal left ventricular filling pattern, with concomitant abnormal relaxation and increased filling pressure (grade 2 diastolic dysfunction). - Aortic valve: There was no stenosis. - Mitral valve: There was trivial regurgitation. - Right ventricle: Poorly visualized. The cavity size was normal. Systolic function was normal. - Tricuspid valve: Poorly visualized. - Pulmonary arteries: No complete TR doppler jet so unable to estimate PA systolic pressure. - Inferior vena cava: The vessel was normal in size. The respirophasic diameter changes were in the normal range (>= 50%), consistent with normal central venous pressure. - Pericardium, extracardiac: Pleural effusion noted. - Impressions: Technically difficult study with poor images. Would suggest repeat study when patient can be positioned. Normal LV size with probably diffuse hypokinesis, EF grossly about 50%. Normal RV size and systolic function. No definite valvular abnormalities.  Echo above was done in the setting of hospitalization for acute on chronic kidney disease with volume overload. Discharge summary  recommended repeating echo on outpatient basis when pt no longer has orthopnea which has not been done.   Reviewed case with Dr. Okey Dupreose.   If no changes, I anticipate pt can proceed with surgery as scheduled.  Rica Mastngela Halaina Vanduzer, FNP-BC [facility] Phone: [phone] 08/07/2015 2:04 PM

## 2015-08-07 NOTE — Telephone Encounter (Signed)
I will keep an eye out for these.  Thanks.

## 2015-08-07 NOTE — Progress Notes (Signed)
Pt denies SOB, chest pain, and being under the care of a cardiologist. Pt denies having a stress test and cardiac cath. Pt denies having a chest x ray within the last year. Pt made aware to stop taking vitamins, fish oil, herbal medicaitons and NSAID's. Pt stated that he does not check his blood sugar when asked about fasting blood glucose level. Pt stated that MD advised him to take half (7 units) of his Lantus insulin tonight. Pt stated, I know what it feels like when my sugar drops. " Pt advised to check blood glucose if he becomes symptomatic, made aware of diabetes protocol and interventions for a BS <70 and > 220, and phone # to SS. Pt verbalized understanding of all pre-op instructions. Spoke with Okey Regalarol, RN to clarify surgeon performing procedure. According to Okey Regalarol, RN, Dr. Hart RochesterLawson is now the surgeon.

## 2015-08-08 ENCOUNTER — Ambulatory Visit (HOSPITAL_COMMUNITY): Payer: Medicare Other | Admitting: Emergency Medicine

## 2015-08-08 ENCOUNTER — Ambulatory Visit (HOSPITAL_COMMUNITY)
Admission: RE | Admit: 2015-08-08 | Discharge: 2015-08-08 | Disposition: A | Discharge status: Home / Self Care | Payer: Medicare Other | Source: Ambulatory Visit | Attending: Vascular Surgery | Admitting: Vascular Surgery

## 2015-08-08 ENCOUNTER — Encounter (HOSPITAL_COMMUNITY): Payer: Self-pay | Admitting: Surgery

## 2015-08-08 ENCOUNTER — Encounter (HOSPITAL_COMMUNITY)
Admission: RE | Disposition: A | Discharge status: Home / Self Care | Payer: Self-pay | Source: Ambulatory Visit | Attending: Vascular Surgery

## 2015-08-08 DIAGNOSIS — T82510A Breakdown (mechanical) of surgically created arteriovenous fistula, initial encounter: Secondary | ICD-10-CM | POA: Diagnosis not present

## 2015-08-08 DIAGNOSIS — Y832 Surgical operation with anastomosis, bypass or graft as the cause of abnormal reaction of the patient, or of later complication, without mention of misadventure at the time of the procedure: Secondary | ICD-10-CM | POA: Diagnosis not present

## 2015-08-08 DIAGNOSIS — Z794 Long term (current) use of insulin: Secondary | ICD-10-CM | POA: Diagnosis not present

## 2015-08-08 DIAGNOSIS — Z7982 Long term (current) use of aspirin: Secondary | ICD-10-CM | POA: Diagnosis not present

## 2015-08-08 DIAGNOSIS — N186 End stage renal disease: Secondary | ICD-10-CM | POA: Diagnosis not present

## 2015-08-08 DIAGNOSIS — I120 Hypertensive chronic kidney disease with stage 5 chronic kidney disease or end stage renal disease: Secondary | ICD-10-CM | POA: Diagnosis not present

## 2015-08-08 DIAGNOSIS — G629 Polyneuropathy, unspecified: Secondary | ICD-10-CM | POA: Insufficient documentation

## 2015-08-08 DIAGNOSIS — I509 Heart failure, unspecified: Secondary | ICD-10-CM | POA: Diagnosis not present

## 2015-08-08 DIAGNOSIS — T82898A Other specified complication of vascular prosthetic devices, implants and grafts, initial encounter: Secondary | ICD-10-CM | POA: Insufficient documentation

## 2015-08-08 DIAGNOSIS — E1165 Type 2 diabetes mellitus with hyperglycemia: Secondary | ICD-10-CM | POA: Diagnosis not present

## 2015-08-08 DIAGNOSIS — Z79899 Other long term (current) drug therapy: Secondary | ICD-10-CM | POA: Diagnosis not present

## 2015-08-08 DIAGNOSIS — M79643 Pain in unspecified hand: Secondary | ICD-10-CM | POA: Diagnosis not present

## 2015-08-08 DIAGNOSIS — E7800 Pure hypercholesterolemia, unspecified: Secondary | ICD-10-CM | POA: Diagnosis not present

## 2015-08-08 DIAGNOSIS — F329 Major depressive disorder, single episode, unspecified: Secondary | ICD-10-CM | POA: Insufficient documentation

## 2015-08-08 DIAGNOSIS — K219 Gastro-esophageal reflux disease without esophagitis: Secondary | ICD-10-CM | POA: Insufficient documentation

## 2015-08-08 DIAGNOSIS — M2450 Contracture, unspecified joint: Secondary | ICD-10-CM | POA: Diagnosis not present

## 2015-08-08 DIAGNOSIS — I739 Peripheral vascular disease, unspecified: Secondary | ICD-10-CM | POA: Insufficient documentation

## 2015-08-08 HISTORY — DX: Gastro-esophageal reflux disease without esophagitis: K21.9

## 2015-08-08 HISTORY — PX: LIGATION OF ARTERIOVENOUS  FISTULA: SHX5948

## 2015-08-08 HISTORY — DX: Polyneuropathy, unspecified: G62.9

## 2015-08-08 HISTORY — DX: Heart failure, unspecified: I50.9

## 2015-08-08 LAB — POCT I-STAT 4, (NA,K, GLUC, HGB,HCT)
Glucose, Bld: 115 mg/dL — ABNORMAL HIGH (ref 65–99)
HCT: 26 % — ABNORMAL LOW (ref 39.0–52.0)
Hemoglobin: 8.8 g/dL — ABNORMAL LOW (ref 13.0–17.0)
POTASSIUM: 3.9 mmol/L (ref 3.5–5.1)
SODIUM: 145 mmol/L (ref 135–145)

## 2015-08-08 LAB — GLUCOSE, CAPILLARY: GLUCOSE-CAPILLARY: 91 mg/dL (ref 65–99)

## 2015-08-08 SURGERY — LIGATION OF ARTERIOVENOUS  FISTULA
Anesthesia: Monitor Anesthesia Care | Site: Arm Upper | Laterality: Right

## 2015-08-08 MED ORDER — METOPROLOL TARTRATE 12.5 MG HALF TABLET
ORAL_TABLET | ORAL | Status: AC
  Administered 2015-08-08: 25 mg via ORAL
  Filled 2015-08-08: qty 2

## 2015-08-08 MED ORDER — LIDOCAINE-EPINEPHRINE (PF) 1 %-1:200000 IJ SOLN
INTRAMUSCULAR | Status: DC | PRN
  Administered 2015-08-08: 7 mL

## 2015-08-08 MED ORDER — FENTANYL CITRATE (PF) 250 MCG/5ML IJ SOLN
INTRAMUSCULAR | Status: AC
  Filled 2015-08-08: qty 5

## 2015-08-08 MED ORDER — MIDAZOLAM HCL 2 MG/2ML IJ SOLN
INTRAMUSCULAR | Status: AC
  Filled 2015-08-08: qty 2

## 2015-08-08 MED ORDER — ONDANSETRON HCL 4 MG/2ML IJ SOLN
INTRAMUSCULAR | Status: DC | PRN
  Administered 2015-08-08: 4 mg via INTRAVENOUS

## 2015-08-08 MED ORDER — MIDAZOLAM HCL 5 MG/5ML IJ SOLN
INTRAMUSCULAR | Status: DC | PRN
  Administered 2015-08-08 (×2): 1 mg via INTRAVENOUS

## 2015-08-08 MED ORDER — OXYCODONE-ACETAMINOPHEN 5-325 MG PO TABS: 1.0000 | ORAL_TABLET | Freq: Four times a day (QID) | ORAL | Status: DC | PRN

## 2015-08-08 MED ORDER — PROPOFOL 500 MG/50ML IV EMUL
INTRAVENOUS | Status: DC | PRN
  Administered 2015-08-08: 50 ug/kg/min via INTRAVENOUS

## 2015-08-08 MED ORDER — SODIUM CHLORIDE 0.9 % IV SOLN
INTRAVENOUS | Status: DC
  Administered 2015-08-08: 25 mL/h via INTRAVENOUS

## 2015-08-08 MED ORDER — PROPOFOL 500 MG/50ML IV EMUL
INTRAVENOUS | Status: AC
  Filled 2015-08-08: qty 50

## 2015-08-08 MED ORDER — CHLORHEXIDINE GLUCONATE CLOTH 2 % EX PADS: 6.0000 | MEDICATED_PAD | Freq: Once | CUTANEOUS | Status: DC

## 2015-08-08 MED ORDER — 0.9 % SODIUM CHLORIDE (POUR BTL) OPTIME
TOPICAL | Status: DC | PRN
  Administered 2015-08-08: 1000 mL

## 2015-08-08 MED ORDER — METOPROLOL TARTRATE 12.5 MG HALF TABLET
25.0000 mg | ORAL_TABLET | Freq: Once | ORAL | Status: AC
  Administered 2015-08-08: 25 mg via ORAL

## 2015-08-08 MED ORDER — LIDOCAINE-EPINEPHRINE (PF) 1 %-1:200000 IJ SOLN
INTRAMUSCULAR | Status: AC
  Filled 2015-08-08: qty 30

## 2015-08-08 MED ORDER — PROPOFOL 10 MG/ML IV BOLUS
INTRAVENOUS | Status: AC
  Filled 2015-08-08: qty 20

## 2015-08-08 MED ORDER — ONDANSETRON HCL 4 MG/2ML IJ SOLN
INTRAMUSCULAR | Status: AC
  Filled 2015-08-08: qty 2

## 2015-08-08 SURGICAL SUPPLY — 25 items
CANISTER SUCTION 2500CC (MISCELLANEOUS) ×3 IMPLANT
CLIP TI MEDIUM 6 (CLIP) IMPLANT
CLIP TI WIDE RED SMALL 6 (CLIP) IMPLANT
ELECT REM PT RETURN 9FT ADLT (ELECTROSURGICAL) ×3
ELECTRODE REM PT RTRN 9FT ADLT (ELECTROSURGICAL) ×1 IMPLANT
GAUZE SPONGE 4X4 12PLY STRL (GAUZE/BANDAGES/DRESSINGS) ×3 IMPLANT
GEL ULTRASOUND 20GR AQUASONIC (MISCELLANEOUS) IMPLANT
GLOVE SS BIOGEL STRL SZ 7 (GLOVE) ×1 IMPLANT
GLOVE SUPERSENSE BIOGEL SZ 7 (GLOVE) ×2
GOWN STRL REUS W/ TWL LRG LVL3 (GOWN DISPOSABLE) ×3 IMPLANT
GOWN STRL REUS W/TWL LRG LVL3 (GOWN DISPOSABLE) ×9
KIT BASIN OR (CUSTOM PROCEDURE TRAY) ×3 IMPLANT
KIT ROOM TURNOVER OR (KITS) ×3 IMPLANT
LIQUID BAND (GAUZE/BANDAGES/DRESSINGS) ×3 IMPLANT
NS IRRIG 1000ML POUR BTL (IV SOLUTION) ×3 IMPLANT
PACK CV ACCESS (CUSTOM PROCEDURE TRAY) ×3 IMPLANT
PAD ARMBOARD 7.5X6 YLW CONV (MISCELLANEOUS) ×6 IMPLANT
SUT PROLENE 6 0 BV (SUTURE) ×3 IMPLANT
SUT SILK 0 TIES 10X30 (SUTURE) ×3 IMPLANT
SUT VIC AB 3-0 SH 27 (SUTURE) ×3
SUT VIC AB 3-0 SH 27X BRD (SUTURE) ×1 IMPLANT
SWAB COLLECTION DEVICE MRSA (MISCELLANEOUS) IMPLANT
TUBE ANAEROBIC SPECIMEN COL (MISCELLANEOUS) IMPLANT
UNDERPAD 30X30 INCONTINENT (UNDERPADS AND DIAPERS) ×3 IMPLANT
WATER STERILE IRR 1000ML POUR (IV SOLUTION) ×3 IMPLANT

## 2015-08-08 NOTE — Transfer of Care (Signed)
Immediate Anesthesia Transfer of Care Note  Patient: Lawrence [name]  Procedure(s) Performed: Procedure(s): LIGATION OF ARTERIOVENOUS  FISTULA- right brachiocephalic (Right)  Patient Location: PACU  Anesthesia Type:MAC  Level of Consciousness: awake, alert , oriented, patient cooperative and responds to stimulation  Airway & Oxygen Therapy: Patient Spontanous Breathing and Patient connected to nasal cannula oxygen  Post-op Assessment: Report given to RN and Post -op Vital signs reviewed and stable  Post vital signs: Reviewed and stable  Last Vitals:  Filed Vitals:   08/08/15 1000 08/08/15 1237  BP: 196/40   Pulse: 67   Temp: 36.8 C 37 C  Resp: 18     Last Pain:  Filed Vitals:   08/08/15 1243  PainSc: 0-No pain      Patients Stated Pain Goal: 3 (08/08/15 1000)  Complications: No apparent anesthesia complications

## 2015-08-08 NOTE — H&P (Signed)
Vascular Surgery H&P  Chief Complaint: Steal syndrome right hand-pain and numbness  HPI: Lawrence [name] is a 55 y.o. male who presents for evaluation of feel syndrome right hand. Patient was evaluated a few weeks ago by Dr. early and is now scheduled for ligation of right brachial-cephalic AV fistula. Patient has chronic numbness in the hand and some discomfort. He is not on hemodialysis. Fistula has been present since 2015.   Past Medical History  Diagnosis Date  . Hypertension   . Chronic kidney disease   . Hypercholesteremia   . PONV (postoperative nausea and vomiting)   . Peripheral vascular disease (HCC)   . H/O hiatal hernia     hx of years ago   . Pneumonia 10/23/2013  . IDDM (insulin dependent diabetes mellitus) (HCC)   . Depression 08/2012    "just when foot first got cut off"  . Fistula   . CHF (congestive heart failure) (HCC)   . GERD (gastroesophageal reflux disease)   . Neuropathy [facility](HCC)    Past Surgical History  Procedure Laterality Date  . Amputation Right 05/03/2012    Procedure: Right First Ray AMPUTATION;  Surgeon: Eldred MangesMark C Yates, MD;  [facility];  Service: Orthopedics;  Laterality: Right;  . I&d extremity Right 05/29/2012    Procedure: IRRIGATION AND DEBRIDEMENT EXTREMITY- right foot;  Surgeon: Eldred MangesMark C Yates, MD;  Location: [facility] OR;  Service: Orthopedics;  Laterality: Right;  Right Foot Debridement, VAC Application  . Amputation Right 08/16/2012    Procedure: AMPUTATION BELOW KNEE;  Surgeon: Eldred MangesMark C Yates, MD;  [facility];  Service: Orthopedics;  Laterality: Right;  Right Below Knee Amputation  . Amputation Right 10/04/2012    Procedure: AMPUTATION BELOW KNEE REVISION;  Surgeon: Eldred MangesMark C Yates, MD;  Location: [facility] OR;  Service: Orthopedics;  Laterality: Right;  . Insertion of suprapubic catheter N/A 03/02/2013    Procedure: INSERTION OF SUPRAPUBIC CATHETER;  Surgeon: Su GrandMarc Nesi, MD;  Location: [facility] ORS;  Service: Urology;  Laterality: N/A;  . Tonsillectomy  ~ 1967  . Av  fistula placement Right 11/01/2013    Procedure: RIGHT ARM ARTERIOVENOUS (AV) FISTULA CREATION;  Surgeon: Chuck Hinthristopher S Dickson, MD;  [facility];  Service: Vascular;  Laterality: Right;  . Abdominal aortagram N/A 05/03/2012    Procedure: ABDOMINAL Ronny FlurryAORTAGRAM;  Surgeon: Sherren Kernsharles E Fields, MD;  [facility];  Service: Cardiovascular;  Laterality: N/A;  . Lower extremity angiogram Right 05/03/2012    Procedure: LOWER EXTREMITY ANGIOGRAM;  Surgeon: Sherren Kernsharles E Fields, MD;  [facility];  Service: Cardiovascular;  Laterality: Right;  rt leg angio   Social History   Social History  . Marital Status: Single    Spouse Name: N/A  . Number of Children: 0  . Years of Education: HS   Occupational History  . Disabled    Social History Main Topics  . Smoking status: Never Smoker   . Smokeless tobacco: Never Used  . Alcohol Use: 0.0 oz/week    0 Standard drinks or equivalent per week     Comment: rare can of beer  . Drug Use: No  . Sexual Activity: Not Currently   Other Topics Concern  . None   Social History Narrative   Lives at home by himself. Cares for uncle with mental disabilities, about 15 years older.     Left-handed.   No caffeine use.      Family History  Problem Relation Age of Onset  . Stroke Mother   .  Cancer Father     Unsure of type   No Known Allergies Prior to Admission medications   Medication Sig Start Date End Date Taking? Authorizing Provider  aspirin 81 MG tablet Take 1 tablet (81 mg total) by mouth daily. Patient taking differently: Take 81 mg by mouth every morning.  07/31/15  Yes Tobey GrimJeffrey H Walden, MD  atorvastatin (LIPITOR) 40 MG tablet Take 1 tablet by mouth  daily 06/15/15  Yes Tobey GrimJeffrey H Walden, MD  bisacodyl (DULCOLAX) 5 MG EC tablet Take 5 mg by mouth daily as needed for moderate constipation.   Yes Historical Provider, MD  Brinzolamide-Brimonidine [facility](SIMBRINZA) 1-0.2 % SUSP Place 1 drop into the left eye 2 (two) times daily.   Yes Historical  Provider, MD  calcitRIOL (ROCALTROL) 0.25 MCG capsule Take 1 capsule (0.25 mcg total) by mouth daily. 11/01/13  Yes Ardith Darkaleb M Parker, MD  furosemide (LASIX) 40 MG tablet Take 40 mg by mouth 2 (two) times daily. 07/11/15  Yes Historical Provider, MD  gabapentin (NEURONTIN) 100 MG capsule Take 1 capsule by mouth 3  times daily 07/16/15  Yes Tobey GrimJeffrey H Walden, MD  Insulin Glargine (LANTUS SOLOSTAR) 100 UNIT/ML Solostar Pen Inject 15 Units into the skin daily at 10 pm. 06/10/15  Yes Tobey GrimJeffrey H Walden, MD  Insulin Pen Needle (EASY TOUCH PEN NEEDLES) 31G X 8 MM MISC USE TO INJECT INSULIN once daily subcutaneously 06/05/15  Yes Tobey GrimJeffrey H Walden, MD  metoprolol tartrate (LOPRESSOR) 25 MG tablet Take 1 tablet by mouth two  times daily 06/15/15  Yes Tobey GrimJeffrey H Walden, MD  Misc. Devices [facility](WHEELCHAIR) MISC Standard wheelchair. Use as needed for immobility 07/03/15  Yes Latrelle DodrillBrittany J McIntyre, MD  solifenacin (VESICARE) 5 MG tablet Take 5 mg by mouth daily.   Yes Historical Provider, MD  Stann OreULTICARE INSULIN SYRINGE 30G X 1/2" 0.3 ML MISC USE ONE SYRINGE AT BEDTIME 11/15/13   Barbaraann BarthelJames O Breen, MD       All other systems have been reviewed and were otherwise negative with the exception of those mentioned in the HPI and as above.  Physical Exam: Filed Vitals:   08/08/15 1000  BP: 196/40  Pulse: 67  Temp: 98.2 F (36.8 C)  Resp: 18    General: Alert, no acute distress HEENT: Normal for age Cardiovascular: Regular rate and rhythm. Carotid pulses 2+, no bruits audible Respiratory: Clear to auscultation. No cyanosis, no use of accessory musculature GI: No organomegaly, abdomen is soft and non-tender Skin: No lesions in the area of chief complaint Neurologic: Sensation intact distally Psychiatric: Patient is competent for consent with normal mood and affect Musculoskeletal: No obvious deformities Extremities: Right upper extremity with brachial-cephalic AV fistula with good pulse and palpable thrill. Diminished right radial  pulse distally. Right hand is slightly cooler than left in both hands ar   Assessment/Plan:  Plan ligation right brachial-cephalic AV fistula. Patient realizes that numbness and hand is probably not reversible. He would like to proceed.   Josephina GipJames Lawson, MD 08/08/2015 11:39 AM

## 2015-08-08 NOTE — Op Note (Signed)
OPERATIVE REPORT  Date of Surgery: 08/08/2015  Surgeon: Josephina GipJames Lawson, MD  Assistant: Nurse  Pre-op Diagnosis: End Stage Renal Disease N18.6 With steal syndrome right upper extremity from brachial-cephalic AV fistula  Post-op Diagnosis: End Stage Renal Disease with steal syndrome right upper extremity with right brachial cephalic AV fistula  Procedure: Procedure(s): LIGATION OF ARTERIOVENOUS  FISTULA- right brachiocephalic  Anesthesia: Mac  EBL: Minimal  Complications: None  Procedure Details: The patient is taking the operative placed in supine position at which time right upper extremity was prepped Betadine scrub and solution draped in routine sterile manner. There was a functioning brachial-cephalic AV fistula which had good pulse and excellent thrill and a very diminished right radial pulse. Patient has slight flexion contracture at the elbow and care was taken not to cause any discomfort. After infiltration forms and Xylocaine with epinephrine short transverse incision was made through the previous scar near the cephalic vein brachial artery anastomosis. Cephalic vein was dissected free circumflex leading down to the brachial artery. The vein was then ligated near the anastomosis with 2-0 silk ties. There was much improved Doppler flow in the radial artery distally and good brachial arterial flow distal to the previous anastomosis. Adequate hemostasis was achieved wound closed in layers with Vicryl subcuticular fashion with Dermabond patient taken to recovery in satisfactory condition   Josephina GipJames Lawson, MD 08/08/2015 12:34 PM

## 2015-08-08 NOTE — Anesthesia Postprocedure Evaluation (Signed)
Anesthesia Post Note  Patient: Lawrence [name]  Procedure(s) Performed: Procedure(s) (LRB): LIGATION OF ARTERIOVENOUS  FISTULA- right brachiocephalic (Right)  Patient location during evaluation: PACU Anesthesia Type: MAC Level of consciousness: awake and alert Pain management: pain level controlled Vital Signs Assessment: post-procedure vital signs reviewed and stable Respiratory status: spontaneous breathing Cardiovascular status: stable Anesthetic complications: no    Last Vitals:  Filed Vitals:   08/08/15 1337 08/08/15 1344  BP: 161/61   Pulse: 58   Temp:  36.8 C  Resp: 9     Last Pain:  Filed Vitals:   08/08/15 1345  PainSc: 0-No pain                 Lewie LoronJohn Haydon Kalmar

## 2015-08-08 NOTE — Anesthesia Preprocedure Evaluation (Addendum)
Anesthesia Evaluation  Patient identified by MRN, date of birth, ID band Patient awake    Reviewed: Allergy & Precautions, H&P , NPO status , Patient's Chart, lab work & pertinent test results  History of Anesthesia Complications (+) PONV and history of anesthetic complications  Airway Mallampati: II  TM Distance: >3 FB Neck ROM: Full    Dental  (+) Chipped, Dental Advisory Given   Pulmonary pneumonia,    breath sounds clear to auscultation       Cardiovascular hypertension, + Peripheral Vascular Disease and +CHF   Rhythm:Regular Rate:Normal     Neuro/Psych PSYCHIATRIC DISORDERS Depression    GI/Hepatic Neg liver ROS, hiatal hernia, GERD  ,  Endo/Other  diabetes, Poorly Controlled, Type 2  Renal/GU Renal diseasePatient has suprapubic catheter- clamped.  Patient states it does not need to be emptied right now.     Musculoskeletal   Abdominal   Peds  Hematology  (+) anemia ,   Anesthesia Other Findings   Reproductive/Obstetrics                           Anesthesia Physical  Anesthesia Plan  ASA: III  Anesthesia Plan: MAC   Post-op Pain Management:    Induction: Intravenous  Airway Management Planned: Simple Face Mask  Additional Equipment:   Intra-op Plan:   Post-operative Plan:   Informed Consent: I have reviewed the patients History and Physical, chart, labs and discussed the procedure including the risks, benefits and alternatives for the proposed anesthesia with the patient or authorized representative who has indicated his/her understanding and acceptance.   Dental advisory given  Plan Discussed with: CRNA and Anesthesiologist  Anesthesia Plan Comments:         Anesthesia Quick Evaluation

## 2015-08-09 ENCOUNTER — Encounter (HOSPITAL_COMMUNITY): Payer: Self-pay | Admitting: Vascular Surgery

## 2015-08-09 DIAGNOSIS — E1151 Type 2 diabetes mellitus with diabetic peripheral angiopathy without gangrene: Secondary | ICD-10-CM | POA: Diagnosis not present

## 2015-08-09 DIAGNOSIS — R2689 Other abnormalities of gait and mobility: Secondary | ICD-10-CM | POA: Diagnosis not present

## 2015-08-09 DIAGNOSIS — M869 Osteomyelitis, unspecified: Secondary | ICD-10-CM | POA: Diagnosis not present

## 2015-08-09 DIAGNOSIS — E1140 Type 2 diabetes mellitus with diabetic neuropathy, unspecified: Secondary | ICD-10-CM | POA: Diagnosis not present

## 2015-08-09 DIAGNOSIS — Z89511 Acquired absence of right leg below knee: Secondary | ICD-10-CM | POA: Diagnosis not present

## 2015-08-11 DIAGNOSIS — R339 Retention of urine, unspecified: Secondary | ICD-10-CM | POA: Diagnosis not present

## 2015-08-11 DIAGNOSIS — E1151 Type 2 diabetes mellitus with diabetic peripheral angiopathy without gangrene: Secondary | ICD-10-CM | POA: Diagnosis not present

## 2015-08-11 DIAGNOSIS — E1122 Type 2 diabetes mellitus with diabetic chronic kidney disease: Secondary | ICD-10-CM | POA: Diagnosis not present

## 2015-08-11 DIAGNOSIS — Z794 Long term (current) use of insulin: Secondary | ICD-10-CM | POA: Diagnosis not present

## 2015-08-11 DIAGNOSIS — Z435 Encounter for attention to cystostomy: Secondary | ICD-10-CM | POA: Diagnosis not present

## 2015-08-11 DIAGNOSIS — N184 Chronic kidney disease, stage 4 (severe): Secondary | ICD-10-CM | POA: Diagnosis not present

## 2015-08-11 DIAGNOSIS — M5010 Cervical disc disorder with radiculopathy, unspecified cervical region: Secondary | ICD-10-CM | POA: Diagnosis not present

## 2015-08-11 DIAGNOSIS — E1140 Type 2 diabetes mellitus with diabetic neuropathy, unspecified: Secondary | ICD-10-CM | POA: Diagnosis not present

## 2015-08-11 DIAGNOSIS — I5032 Chronic diastolic (congestive) heart failure: Secondary | ICD-10-CM | POA: Diagnosis not present

## 2015-08-11 DIAGNOSIS — I130 Hypertensive heart and chronic kidney disease with heart failure and stage 1 through stage 4 chronic kidney disease, or unspecified chronic kidney disease: Secondary | ICD-10-CM | POA: Diagnosis not present

## 2015-08-11 DIAGNOSIS — Z7982 Long term (current) use of aspirin: Secondary | ICD-10-CM | POA: Diagnosis not present

## 2015-08-11 DIAGNOSIS — E785 Hyperlipidemia, unspecified: Secondary | ICD-10-CM | POA: Diagnosis not present

## 2015-08-12 DIAGNOSIS — N184 Chronic kidney disease, stage 4 (severe): Secondary | ICD-10-CM | POA: Diagnosis not present

## 2015-08-12 DIAGNOSIS — R339 Retention of urine, unspecified: Secondary | ICD-10-CM | POA: Diagnosis not present

## 2015-08-12 DIAGNOSIS — E1122 Type 2 diabetes mellitus with diabetic chronic kidney disease: Secondary | ICD-10-CM | POA: Diagnosis not present

## 2015-08-12 DIAGNOSIS — Z435 Encounter for attention to cystostomy: Secondary | ICD-10-CM | POA: Diagnosis not present

## 2015-08-12 DIAGNOSIS — Z7982 Long term (current) use of aspirin: Secondary | ICD-10-CM | POA: Diagnosis not present

## 2015-08-12 DIAGNOSIS — E1151 Type 2 diabetes mellitus with diabetic peripheral angiopathy without gangrene: Secondary | ICD-10-CM | POA: Diagnosis not present

## 2015-08-12 DIAGNOSIS — Z794 Long term (current) use of insulin: Secondary | ICD-10-CM | POA: Diagnosis not present

## 2015-08-12 DIAGNOSIS — I5032 Chronic diastolic (congestive) heart failure: Secondary | ICD-10-CM | POA: Diagnosis not present

## 2015-08-12 DIAGNOSIS — E785 Hyperlipidemia, unspecified: Secondary | ICD-10-CM | POA: Diagnosis not present

## 2015-08-12 DIAGNOSIS — E1140 Type 2 diabetes mellitus with diabetic neuropathy, unspecified: Secondary | ICD-10-CM | POA: Diagnosis not present

## 2015-08-12 DIAGNOSIS — I130 Hypertensive heart and chronic kidney disease with heart failure and stage 1 through stage 4 chronic kidney disease, or unspecified chronic kidney disease: Secondary | ICD-10-CM | POA: Diagnosis not present

## 2015-08-12 DIAGNOSIS — M5010 Cervical disc disorder with radiculopathy, unspecified cervical region: Secondary | ICD-10-CM | POA: Diagnosis not present

## 2015-08-14 DIAGNOSIS — E1140 Type 2 diabetes mellitus with diabetic neuropathy, unspecified: Secondary | ICD-10-CM | POA: Diagnosis not present

## 2015-08-14 DIAGNOSIS — Z794 Long term (current) use of insulin: Secondary | ICD-10-CM | POA: Diagnosis not present

## 2015-08-14 DIAGNOSIS — Z7982 Long term (current) use of aspirin: Secondary | ICD-10-CM | POA: Diagnosis not present

## 2015-08-14 DIAGNOSIS — M5010 Cervical disc disorder with radiculopathy, unspecified cervical region: Secondary | ICD-10-CM | POA: Diagnosis not present

## 2015-08-14 DIAGNOSIS — E785 Hyperlipidemia, unspecified: Secondary | ICD-10-CM | POA: Diagnosis not present

## 2015-08-14 DIAGNOSIS — E1151 Type 2 diabetes mellitus with diabetic peripheral angiopathy without gangrene: Secondary | ICD-10-CM | POA: Diagnosis not present

## 2015-08-14 DIAGNOSIS — I130 Hypertensive heart and chronic kidney disease with heart failure and stage 1 through stage 4 chronic kidney disease, or unspecified chronic kidney disease: Secondary | ICD-10-CM | POA: Diagnosis not present

## 2015-08-14 DIAGNOSIS — N184 Chronic kidney disease, stage 4 (severe): Secondary | ICD-10-CM | POA: Diagnosis not present

## 2015-08-14 DIAGNOSIS — E1122 Type 2 diabetes mellitus with diabetic chronic kidney disease: Secondary | ICD-10-CM | POA: Diagnosis not present

## 2015-08-14 DIAGNOSIS — R339 Retention of urine, unspecified: Secondary | ICD-10-CM | POA: Diagnosis not present

## 2015-08-14 DIAGNOSIS — Z435 Encounter for attention to cystostomy: Secondary | ICD-10-CM | POA: Diagnosis not present

## 2015-08-14 DIAGNOSIS — I5032 Chronic diastolic (congestive) heart failure: Secondary | ICD-10-CM | POA: Diagnosis not present

## 2015-08-18 ENCOUNTER — Telehealth: Payer: Self-pay | Admitting: *Deleted

## 2015-08-18 DIAGNOSIS — E1122 Type 2 diabetes mellitus with diabetic chronic kidney disease: Secondary | ICD-10-CM | POA: Diagnosis not present

## 2015-08-18 DIAGNOSIS — E785 Hyperlipidemia, unspecified: Secondary | ICD-10-CM | POA: Diagnosis not present

## 2015-08-18 DIAGNOSIS — M5010 Cervical disc disorder with radiculopathy, unspecified cervical region: Secondary | ICD-10-CM | POA: Diagnosis not present

## 2015-08-18 DIAGNOSIS — K649 Unspecified hemorrhoids: Secondary | ICD-10-CM

## 2015-08-18 DIAGNOSIS — Z435 Encounter for attention to cystostomy: Secondary | ICD-10-CM | POA: Diagnosis not present

## 2015-08-18 DIAGNOSIS — E1140 Type 2 diabetes mellitus with diabetic neuropathy, unspecified: Secondary | ICD-10-CM | POA: Diagnosis not present

## 2015-08-18 DIAGNOSIS — R339 Retention of urine, unspecified: Secondary | ICD-10-CM | POA: Diagnosis not present

## 2015-08-18 DIAGNOSIS — I130 Hypertensive heart and chronic kidney disease with heart failure and stage 1 through stage 4 chronic kidney disease, or unspecified chronic kidney disease: Secondary | ICD-10-CM | POA: Diagnosis not present

## 2015-08-18 DIAGNOSIS — Z794 Long term (current) use of insulin: Secondary | ICD-10-CM | POA: Diagnosis not present

## 2015-08-18 DIAGNOSIS — I5032 Chronic diastolic (congestive) heart failure: Secondary | ICD-10-CM | POA: Diagnosis not present

## 2015-08-18 DIAGNOSIS — N184 Chronic kidney disease, stage 4 (severe): Secondary | ICD-10-CM | POA: Diagnosis not present

## 2015-08-18 DIAGNOSIS — E1151 Type 2 diabetes mellitus with diabetic peripheral angiopathy without gangrene: Secondary | ICD-10-CM | POA: Diagnosis not present

## 2015-08-18 DIAGNOSIS — Z7982 Long term (current) use of aspirin: Secondary | ICD-10-CM | POA: Diagnosis not present

## 2015-08-18 DIAGNOSIS — L89322 Pressure ulcer of left buttock, stage 2: Secondary | ICD-10-CM | POA: Diagnosis not present

## 2015-08-18 MED ORDER — PSYLLIUM 100 % PO POWD: ORAL | Status: AC

## 2015-08-18 MED ORDER — LIDOCAINE-HYDROCORTISONE ACE 3-2.5 % RE KIT: 1.0000 | PACK | Freq: Two times a day (BID) | RECTAL | Status: AC

## 2015-08-18 NOTE — Telephone Encounter (Signed)
I have sent two prescriptions to his pharmacy. I recommend he come to the clinic for evaluation. He may need surgery or banding.  Thanks! Alwyn Renaye

## 2015-08-18 NOTE — Telephone Encounter (Signed)
Patient is requesting for a nurse to come out to his home today due to hemorrhoid concerns.  They will need an order for a 1 time visit.  Lawrence [name],CMA

## 2015-08-18 NOTE — Telephone Encounter (Signed)
Home Health Nurse is at his house at this time.  States that "he is having some pretty bad hemorrhoid pain" and wanted to know what to do.  Misty StanleyLisa Banker(RN) states that the hemorrhoid is very painful and is thrombosed wants to know if anything can be called in for him, when offered appointment pt declines.  Per Misty StanleyLisa pt has a really hard time getting out of the house let alone finding transportation and he is very adamant about not coming in.  Advised I would send a message to MD. Milas GainFleeger, [name], CMA

## 2015-08-19 ENCOUNTER — Telehealth: Payer: Self-pay | Admitting: *Deleted

## 2015-08-19 DIAGNOSIS — Z794 Long term (current) use of insulin: Secondary | ICD-10-CM | POA: Diagnosis not present

## 2015-08-19 DIAGNOSIS — R339 Retention of urine, unspecified: Secondary | ICD-10-CM | POA: Diagnosis not present

## 2015-08-19 DIAGNOSIS — Z435 Encounter for attention to cystostomy: Secondary | ICD-10-CM | POA: Diagnosis not present

## 2015-08-19 DIAGNOSIS — I130 Hypertensive heart and chronic kidney disease with heart failure and stage 1 through stage 4 chronic kidney disease, or unspecified chronic kidney disease: Secondary | ICD-10-CM | POA: Diagnosis not present

## 2015-08-19 DIAGNOSIS — N184 Chronic kidney disease, stage 4 (severe): Secondary | ICD-10-CM | POA: Diagnosis not present

## 2015-08-19 DIAGNOSIS — Z7982 Long term (current) use of aspirin: Secondary | ICD-10-CM | POA: Diagnosis not present

## 2015-08-19 DIAGNOSIS — E1151 Type 2 diabetes mellitus with diabetic peripheral angiopathy without gangrene: Secondary | ICD-10-CM | POA: Diagnosis not present

## 2015-08-19 DIAGNOSIS — E785 Hyperlipidemia, unspecified: Secondary | ICD-10-CM | POA: Diagnosis not present

## 2015-08-19 DIAGNOSIS — I5032 Chronic diastolic (congestive) heart failure: Secondary | ICD-10-CM | POA: Diagnosis not present

## 2015-08-19 DIAGNOSIS — E1122 Type 2 diabetes mellitus with diabetic chronic kidney disease: Secondary | ICD-10-CM | POA: Diagnosis not present

## 2015-08-19 DIAGNOSIS — E1140 Type 2 diabetes mellitus with diabetic neuropathy, unspecified: Secondary | ICD-10-CM | POA: Diagnosis not present

## 2015-08-19 DIAGNOSIS — M5010 Cervical disc disorder with radiculopathy, unspecified cervical region: Secondary | ICD-10-CM | POA: Diagnosis not present

## 2015-08-19 NOTE — Telephone Encounter (Signed)
Spoke with patient and he is aware of medication refills. [name],CMA

## 2015-08-19 NOTE — Telephone Encounter (Signed)
Spoke with patient and he is aware of this.  He states that he doesn't have good transportation at this time. Zamoria Boss,CMA

## 2015-08-19 NOTE — Telephone Encounter (Signed)
LVM for pt to call the office. If pt calls, please inform him of the below information. Sunday SpillersSharon T Saunders, CMA

## 2015-08-19 NOTE — Telephone Encounter (Signed)
Pt informed of previous message. [name], CMA

## 2015-08-20 DIAGNOSIS — N184 Chronic kidney disease, stage 4 (severe): Secondary | ICD-10-CM | POA: Diagnosis not present

## 2015-08-20 DIAGNOSIS — E1151 Type 2 diabetes mellitus with diabetic peripheral angiopathy without gangrene: Secondary | ICD-10-CM | POA: Diagnosis not present

## 2015-08-20 DIAGNOSIS — Z7982 Long term (current) use of aspirin: Secondary | ICD-10-CM | POA: Diagnosis not present

## 2015-08-20 DIAGNOSIS — Z435 Encounter for attention to cystostomy: Secondary | ICD-10-CM | POA: Diagnosis not present

## 2015-08-20 DIAGNOSIS — R339 Retention of urine, unspecified: Secondary | ICD-10-CM | POA: Diagnosis not present

## 2015-08-20 DIAGNOSIS — M5010 Cervical disc disorder with radiculopathy, unspecified cervical region: Secondary | ICD-10-CM | POA: Diagnosis not present

## 2015-08-20 DIAGNOSIS — Z794 Long term (current) use of insulin: Secondary | ICD-10-CM | POA: Diagnosis not present

## 2015-08-20 DIAGNOSIS — I5032 Chronic diastolic (congestive) heart failure: Secondary | ICD-10-CM | POA: Diagnosis not present

## 2015-08-20 DIAGNOSIS — I130 Hypertensive heart and chronic kidney disease with heart failure and stage 1 through stage 4 chronic kidney disease, or unspecified chronic kidney disease: Secondary | ICD-10-CM | POA: Diagnosis not present

## 2015-08-20 DIAGNOSIS — E1122 Type 2 diabetes mellitus with diabetic chronic kidney disease: Secondary | ICD-10-CM | POA: Diagnosis not present

## 2015-08-20 DIAGNOSIS — E785 Hyperlipidemia, unspecified: Secondary | ICD-10-CM | POA: Diagnosis not present

## 2015-08-20 DIAGNOSIS — E1140 Type 2 diabetes mellitus with diabetic neuropathy, unspecified: Secondary | ICD-10-CM | POA: Diagnosis not present

## 2015-08-21 DIAGNOSIS — Z7982 Long term (current) use of aspirin: Secondary | ICD-10-CM | POA: Diagnosis not present

## 2015-08-21 DIAGNOSIS — N184 Chronic kidney disease, stage 4 (severe): Secondary | ICD-10-CM | POA: Diagnosis not present

## 2015-08-21 DIAGNOSIS — Z794 Long term (current) use of insulin: Secondary | ICD-10-CM | POA: Diagnosis not present

## 2015-08-21 DIAGNOSIS — E785 Hyperlipidemia, unspecified: Secondary | ICD-10-CM | POA: Diagnosis not present

## 2015-08-21 DIAGNOSIS — E1151 Type 2 diabetes mellitus with diabetic peripheral angiopathy without gangrene: Secondary | ICD-10-CM | POA: Diagnosis not present

## 2015-08-21 DIAGNOSIS — I130 Hypertensive heart and chronic kidney disease with heart failure and stage 1 through stage 4 chronic kidney disease, or unspecified chronic kidney disease: Secondary | ICD-10-CM | POA: Diagnosis not present

## 2015-08-21 DIAGNOSIS — R339 Retention of urine, unspecified: Secondary | ICD-10-CM | POA: Diagnosis not present

## 2015-08-21 DIAGNOSIS — M5010 Cervical disc disorder with radiculopathy, unspecified cervical region: Secondary | ICD-10-CM | POA: Diagnosis not present

## 2015-08-21 DIAGNOSIS — E1140 Type 2 diabetes mellitus with diabetic neuropathy, unspecified: Secondary | ICD-10-CM | POA: Diagnosis not present

## 2015-08-21 DIAGNOSIS — E1122 Type 2 diabetes mellitus with diabetic chronic kidney disease: Secondary | ICD-10-CM | POA: Diagnosis not present

## 2015-08-21 DIAGNOSIS — I5032 Chronic diastolic (congestive) heart failure: Secondary | ICD-10-CM | POA: Diagnosis not present

## 2015-08-21 DIAGNOSIS — Z435 Encounter for attention to cystostomy: Secondary | ICD-10-CM | POA: Diagnosis not present

## 2015-08-22 DIAGNOSIS — Z435 Encounter for attention to cystostomy: Secondary | ICD-10-CM | POA: Diagnosis not present

## 2015-08-22 DIAGNOSIS — Z7982 Long term (current) use of aspirin: Secondary | ICD-10-CM | POA: Diagnosis not present

## 2015-08-22 DIAGNOSIS — N184 Chronic kidney disease, stage 4 (severe): Secondary | ICD-10-CM | POA: Diagnosis not present

## 2015-08-22 DIAGNOSIS — E1122 Type 2 diabetes mellitus with diabetic chronic kidney disease: Secondary | ICD-10-CM | POA: Diagnosis not present

## 2015-08-22 DIAGNOSIS — E1140 Type 2 diabetes mellitus with diabetic neuropathy, unspecified: Secondary | ICD-10-CM | POA: Diagnosis not present

## 2015-08-22 DIAGNOSIS — I130 Hypertensive heart and chronic kidney disease with heart failure and stage 1 through stage 4 chronic kidney disease, or unspecified chronic kidney disease: Secondary | ICD-10-CM | POA: Diagnosis not present

## 2015-08-22 DIAGNOSIS — Z794 Long term (current) use of insulin: Secondary | ICD-10-CM | POA: Diagnosis not present

## 2015-08-22 DIAGNOSIS — R339 Retention of urine, unspecified: Secondary | ICD-10-CM | POA: Diagnosis not present

## 2015-08-22 DIAGNOSIS — I5032 Chronic diastolic (congestive) heart failure: Secondary | ICD-10-CM | POA: Diagnosis not present

## 2015-08-22 DIAGNOSIS — E1151 Type 2 diabetes mellitus with diabetic peripheral angiopathy without gangrene: Secondary | ICD-10-CM | POA: Diagnosis not present

## 2015-08-22 DIAGNOSIS — E785 Hyperlipidemia, unspecified: Secondary | ICD-10-CM | POA: Diagnosis not present

## 2015-08-22 DIAGNOSIS — M5010 Cervical disc disorder with radiculopathy, unspecified cervical region: Secondary | ICD-10-CM | POA: Diagnosis not present

## 2015-08-25 ENCOUNTER — Telehealth: Payer: Self-pay

## 2015-08-25 DIAGNOSIS — N184 Chronic kidney disease, stage 4 (severe): Secondary | ICD-10-CM | POA: Diagnosis not present

## 2015-08-25 DIAGNOSIS — N185 Chronic kidney disease, stage 5: Secondary | ICD-10-CM

## 2015-08-25 DIAGNOSIS — I5032 Chronic diastolic (congestive) heart failure: Secondary | ICD-10-CM | POA: Diagnosis not present

## 2015-08-25 DIAGNOSIS — Z48812 Encounter for surgical aftercare following surgery on the circulatory system: Secondary | ICD-10-CM

## 2015-08-25 DIAGNOSIS — Z435 Encounter for attention to cystostomy: Secondary | ICD-10-CM | POA: Diagnosis not present

## 2015-08-25 DIAGNOSIS — Z7982 Long term (current) use of aspirin: Secondary | ICD-10-CM | POA: Diagnosis not present

## 2015-08-25 DIAGNOSIS — E785 Hyperlipidemia, unspecified: Secondary | ICD-10-CM | POA: Diagnosis not present

## 2015-08-25 DIAGNOSIS — M5010 Cervical disc disorder with radiculopathy, unspecified cervical region: Secondary | ICD-10-CM | POA: Diagnosis not present

## 2015-08-25 DIAGNOSIS — R339 Retention of urine, unspecified: Secondary | ICD-10-CM | POA: Diagnosis not present

## 2015-08-25 DIAGNOSIS — Z794 Long term (current) use of insulin: Secondary | ICD-10-CM | POA: Diagnosis not present

## 2015-08-25 DIAGNOSIS — E1140 Type 2 diabetes mellitus with diabetic neuropathy, unspecified: Secondary | ICD-10-CM | POA: Diagnosis not present

## 2015-08-25 DIAGNOSIS — I130 Hypertensive heart and chronic kidney disease with heart failure and stage 1 through stage 4 chronic kidney disease, or unspecified chronic kidney disease: Secondary | ICD-10-CM | POA: Diagnosis not present

## 2015-08-25 DIAGNOSIS — R200 Anesthesia of skin: Secondary | ICD-10-CM

## 2015-08-25 DIAGNOSIS — E1151 Type 2 diabetes mellitus with diabetic peripheral angiopathy without gangrene: Secondary | ICD-10-CM | POA: Diagnosis not present

## 2015-08-25 DIAGNOSIS — E1122 Type 2 diabetes mellitus with diabetic chronic kidney disease: Secondary | ICD-10-CM | POA: Diagnosis not present

## 2015-08-25 NOTE — Telephone Encounter (Signed)
-----   Message from Shari ProwsAnnette W Tobin sent at 08/22/2015  1:26 PM EDT ----- Regarding: Prescription Request Contact: [phone] This patient called to schedule his post op follow up appt with Dr.Lawson. He states that he was prescribed Perocet for pain medication and this upsets his stomach. He would prefer something else prescribed. Thanks, Drinda ButtsAnnette

## 2015-08-25 NOTE — Telephone Encounter (Signed)
Spoke to pt. Pt refused appt. York SpanielSaid he just wanted to come in on 8/3.

## 2015-08-25 NOTE — Telephone Encounter (Signed)
Phone call from pt.  Stated the Percocet prescription he was given after surgery is too strong.  Reported it makes him woozy.  Reported he did not get it filled.  Questioned pt. What he has been taking over past 2 weeks.  Reported he has been using Tylenol for the pain.  Is requesting Vicodin; stated "it works better for me."  Reported has pain and numbness in all the fingers of right hand.  Reported that his pain in 7/10.  Stated there has been "some improvement but not much."  Denied any incisional redness or warmth.  Reported the incision remains intact; denied drainage.  Reported the numbness of the fingers had been intermittent, but now is more constant.  Discussed with Dr. Myra GianottiBrabham.  Recommended for pt. To come to office this week for arterial duplex of right upper extremity and office appt.  Dr. Myra GianottiBrabham declined to give the pt. Any narcotic pain medication at this time.  Will notify pt. Of appt.

## 2015-08-26 ENCOUNTER — Other Ambulatory Visit (HOSPITAL_COMMUNITY): Payer: Medicare Other

## 2015-08-26 ENCOUNTER — Telehealth: Payer: Self-pay | Admitting: Licensed Clinical Social Worker

## 2015-08-26 DIAGNOSIS — Z7982 Long term (current) use of aspirin: Secondary | ICD-10-CM | POA: Diagnosis not present

## 2015-08-26 DIAGNOSIS — R339 Retention of urine, unspecified: Secondary | ICD-10-CM | POA: Diagnosis not present

## 2015-08-26 DIAGNOSIS — I5032 Chronic diastolic (congestive) heart failure: Secondary | ICD-10-CM | POA: Diagnosis not present

## 2015-08-26 DIAGNOSIS — Z794 Long term (current) use of insulin: Secondary | ICD-10-CM | POA: Diagnosis not present

## 2015-08-26 DIAGNOSIS — E1151 Type 2 diabetes mellitus with diabetic peripheral angiopathy without gangrene: Secondary | ICD-10-CM | POA: Diagnosis not present

## 2015-08-26 DIAGNOSIS — M5010 Cervical disc disorder with radiculopathy, unspecified cervical region: Secondary | ICD-10-CM | POA: Diagnosis not present

## 2015-08-26 DIAGNOSIS — I130 Hypertensive heart and chronic kidney disease with heart failure and stage 1 through stage 4 chronic kidney disease, or unspecified chronic kidney disease: Secondary | ICD-10-CM | POA: Diagnosis not present

## 2015-08-26 DIAGNOSIS — E785 Hyperlipidemia, unspecified: Secondary | ICD-10-CM | POA: Diagnosis not present

## 2015-08-26 DIAGNOSIS — N184 Chronic kidney disease, stage 4 (severe): Secondary | ICD-10-CM | POA: Diagnosis not present

## 2015-08-26 DIAGNOSIS — E1140 Type 2 diabetes mellitus with diabetic neuropathy, unspecified: Secondary | ICD-10-CM | POA: Diagnosis not present

## 2015-08-26 DIAGNOSIS — Z435 Encounter for attention to cystostomy: Secondary | ICD-10-CM | POA: Diagnosis not present

## 2015-08-26 DIAGNOSIS — E1122 Type 2 diabetes mellitus with diabetic chronic kidney disease: Secondary | ICD-10-CM | POA: Diagnosis not present

## 2015-08-26 NOTE — Telephone Encounter (Signed)
Marcelino DusterMichelle with [facility] called and states patient will now qualify for PCS.  Will forward to CSW to complete her portion of form and then give to provider to complete and sign his portion. [name],CMA

## 2015-08-26 NOTE — Telephone Encounter (Signed)
CSW received consult for Personal Care Services referral.  Call to patient to obtain Medicaid ID number and to discuss PCS process.  Patient is in agreement with receiving PCS.  Plan: CSW completed top part of PCS application, will place in mailbox for Dr. Alanda SlimGonfa to complete. Form will be faxed to [facility] once completed and signed by MD  Sammuel Hineseborah Jini Horiuchi, LCSW Licensed Clinical Social Worker [facility]   [phone] 1:50 PM

## 2015-08-26 NOTE — Telephone Encounter (Signed)
Return call to patient from voice message left on phone, patient called to inform CSW that he has been approved for Medicaid.  States his [facility] Child psychotherapistsocial worker is coming to see him today and will explain everything to him.    Plan: No CSW services needed at this time.  Patient will call CSW back to coordinate social work services if need.  Sammuel Hineseborah Reyhan Moronta, LCSW Licensed Clinical Social Worker [facility]   [phone] 9:24 AM

## 2015-08-27 DIAGNOSIS — E785 Hyperlipidemia, unspecified: Secondary | ICD-10-CM | POA: Diagnosis not present

## 2015-08-27 DIAGNOSIS — E1140 Type 2 diabetes mellitus with diabetic neuropathy, unspecified: Secondary | ICD-10-CM | POA: Diagnosis not present

## 2015-08-27 DIAGNOSIS — Z794 Long term (current) use of insulin: Secondary | ICD-10-CM | POA: Diagnosis not present

## 2015-08-27 DIAGNOSIS — E1151 Type 2 diabetes mellitus with diabetic peripheral angiopathy without gangrene: Secondary | ICD-10-CM | POA: Diagnosis not present

## 2015-08-27 DIAGNOSIS — M5010 Cervical disc disorder with radiculopathy, unspecified cervical region: Secondary | ICD-10-CM | POA: Diagnosis not present

## 2015-08-27 DIAGNOSIS — E1122 Type 2 diabetes mellitus with diabetic chronic kidney disease: Secondary | ICD-10-CM | POA: Diagnosis not present

## 2015-08-27 DIAGNOSIS — I5032 Chronic diastolic (congestive) heart failure: Secondary | ICD-10-CM | POA: Diagnosis not present

## 2015-08-27 DIAGNOSIS — N184 Chronic kidney disease, stage 4 (severe): Secondary | ICD-10-CM | POA: Diagnosis not present

## 2015-08-27 DIAGNOSIS — Z7982 Long term (current) use of aspirin: Secondary | ICD-10-CM | POA: Diagnosis not present

## 2015-08-27 DIAGNOSIS — Z435 Encounter for attention to cystostomy: Secondary | ICD-10-CM | POA: Diagnosis not present

## 2015-08-27 DIAGNOSIS — I130 Hypertensive heart and chronic kidney disease with heart failure and stage 1 through stage 4 chronic kidney disease, or unspecified chronic kidney disease: Secondary | ICD-10-CM | POA: Diagnosis not present

## 2015-08-27 DIAGNOSIS — R339 Retention of urine, unspecified: Secondary | ICD-10-CM | POA: Diagnosis not present

## 2015-08-27 NOTE — Telephone Encounter (Signed)
Patient's form for personal care service was filled by me and signed by Dr. Gwendolyn GrantWalden and placed in outgoing fax basket.

## 2015-08-27 NOTE — Telephone Encounter (Signed)
Left a voicemail for a patient to try preparation-H for his hemorrhoid. If no improvement with this, advised him to come to the clinic for evaluation.  I have also complicated his form for personal care service and left it in outgrowing fax box.  Thanks!

## 2015-08-28 DIAGNOSIS — M5010 Cervical disc disorder with radiculopathy, unspecified cervical region: Secondary | ICD-10-CM | POA: Diagnosis not present

## 2015-08-28 DIAGNOSIS — I5032 Chronic diastolic (congestive) heart failure: Secondary | ICD-10-CM | POA: Diagnosis not present

## 2015-08-28 DIAGNOSIS — E785 Hyperlipidemia, unspecified: Secondary | ICD-10-CM | POA: Diagnosis not present

## 2015-08-28 DIAGNOSIS — E1140 Type 2 diabetes mellitus with diabetic neuropathy, unspecified: Secondary | ICD-10-CM | POA: Diagnosis not present

## 2015-08-28 DIAGNOSIS — N184 Chronic kidney disease, stage 4 (severe): Secondary | ICD-10-CM | POA: Diagnosis not present

## 2015-08-28 DIAGNOSIS — R339 Retention of urine, unspecified: Secondary | ICD-10-CM | POA: Diagnosis not present

## 2015-08-28 DIAGNOSIS — I130 Hypertensive heart and chronic kidney disease with heart failure and stage 1 through stage 4 chronic kidney disease, or unspecified chronic kidney disease: Secondary | ICD-10-CM | POA: Diagnosis not present

## 2015-08-28 DIAGNOSIS — Z435 Encounter for attention to cystostomy: Secondary | ICD-10-CM | POA: Diagnosis not present

## 2015-08-28 DIAGNOSIS — Z794 Long term (current) use of insulin: Secondary | ICD-10-CM | POA: Diagnosis not present

## 2015-08-28 DIAGNOSIS — Z7982 Long term (current) use of aspirin: Secondary | ICD-10-CM | POA: Diagnosis not present

## 2015-08-28 DIAGNOSIS — E1151 Type 2 diabetes mellitus with diabetic peripheral angiopathy without gangrene: Secondary | ICD-10-CM | POA: Diagnosis not present

## 2015-08-28 DIAGNOSIS — E1122 Type 2 diabetes mellitus with diabetic chronic kidney disease: Secondary | ICD-10-CM | POA: Diagnosis not present

## 2015-08-29 ENCOUNTER — Telehealth: Payer: Self-pay | Admitting: *Deleted

## 2015-08-29 ENCOUNTER — Ambulatory Visit: Payer: Medicare Other | Admitting: Vascular Surgery

## 2015-08-29 DIAGNOSIS — E785 Hyperlipidemia, unspecified: Secondary | ICD-10-CM | POA: Diagnosis not present

## 2015-08-29 DIAGNOSIS — M5010 Cervical disc disorder with radiculopathy, unspecified cervical region: Secondary | ICD-10-CM | POA: Diagnosis not present

## 2015-08-29 DIAGNOSIS — E1140 Type 2 diabetes mellitus with diabetic neuropathy, unspecified: Secondary | ICD-10-CM | POA: Diagnosis not present

## 2015-08-29 DIAGNOSIS — L89322 Pressure ulcer of left buttock, stage 2: Secondary | ICD-10-CM | POA: Diagnosis not present

## 2015-08-29 DIAGNOSIS — Z435 Encounter for attention to cystostomy: Secondary | ICD-10-CM | POA: Diagnosis not present

## 2015-08-29 DIAGNOSIS — I130 Hypertensive heart and chronic kidney disease with heart failure and stage 1 through stage 4 chronic kidney disease, or unspecified chronic kidney disease: Secondary | ICD-10-CM | POA: Diagnosis not present

## 2015-08-29 DIAGNOSIS — Z794 Long term (current) use of insulin: Secondary | ICD-10-CM | POA: Diagnosis not present

## 2015-08-29 DIAGNOSIS — R339 Retention of urine, unspecified: Secondary | ICD-10-CM | POA: Diagnosis not present

## 2015-08-29 DIAGNOSIS — E1151 Type 2 diabetes mellitus with diabetic peripheral angiopathy without gangrene: Secondary | ICD-10-CM | POA: Diagnosis not present

## 2015-08-29 DIAGNOSIS — I5032 Chronic diastolic (congestive) heart failure: Secondary | ICD-10-CM | POA: Diagnosis not present

## 2015-08-29 DIAGNOSIS — N184 Chronic kidney disease, stage 4 (severe): Secondary | ICD-10-CM | POA: Diagnosis not present

## 2015-08-29 DIAGNOSIS — E1122 Type 2 diabetes mellitus with diabetic chronic kidney disease: Secondary | ICD-10-CM | POA: Diagnosis not present

## 2015-08-29 DIAGNOSIS — Z7982 Long term (current) use of aspirin: Secondary | ICD-10-CM | POA: Diagnosis not present

## 2015-08-29 NOTE — Telephone Encounter (Signed)
Loraine LericheMark, Physical Therapist with [facility] called to request re-certification for home physical therapy up to 60 days. Verbal order given by Dr. Alanda SlimGonfa to continue physical therapy.  Clovis PuMartin, Tamika L, RN

## 2015-09-02 DIAGNOSIS — M5010 Cervical disc disorder with radiculopathy, unspecified cervical region: Secondary | ICD-10-CM | POA: Diagnosis not present

## 2015-09-02 DIAGNOSIS — E785 Hyperlipidemia, unspecified: Secondary | ICD-10-CM | POA: Diagnosis not present

## 2015-09-02 DIAGNOSIS — Z794 Long term (current) use of insulin: Secondary | ICD-10-CM | POA: Diagnosis not present

## 2015-09-02 DIAGNOSIS — Z435 Encounter for attention to cystostomy: Secondary | ICD-10-CM | POA: Diagnosis not present

## 2015-09-02 DIAGNOSIS — Z7982 Long term (current) use of aspirin: Secondary | ICD-10-CM | POA: Diagnosis not present

## 2015-09-02 DIAGNOSIS — R339 Retention of urine, unspecified: Secondary | ICD-10-CM | POA: Diagnosis not present

## 2015-09-02 DIAGNOSIS — N184 Chronic kidney disease, stage 4 (severe): Secondary | ICD-10-CM | POA: Diagnosis not present

## 2015-09-02 DIAGNOSIS — E1140 Type 2 diabetes mellitus with diabetic neuropathy, unspecified: Secondary | ICD-10-CM | POA: Diagnosis not present

## 2015-09-02 DIAGNOSIS — E1151 Type 2 diabetes mellitus with diabetic peripheral angiopathy without gangrene: Secondary | ICD-10-CM | POA: Diagnosis not present

## 2015-09-02 DIAGNOSIS — I5032 Chronic diastolic (congestive) heart failure: Secondary | ICD-10-CM | POA: Diagnosis not present

## 2015-09-02 DIAGNOSIS — E1122 Type 2 diabetes mellitus with diabetic chronic kidney disease: Secondary | ICD-10-CM | POA: Diagnosis not present

## 2015-09-02 DIAGNOSIS — I130 Hypertensive heart and chronic kidney disease with heart failure and stage 1 through stage 4 chronic kidney disease, or unspecified chronic kidney disease: Secondary | ICD-10-CM | POA: Diagnosis not present

## 2015-09-03 ENCOUNTER — Telehealth: Payer: Self-pay | Admitting: *Deleted

## 2015-09-03 DIAGNOSIS — L89322 Pressure ulcer of left buttock, stage 2: Secondary | ICD-10-CM | POA: Diagnosis not present

## 2015-09-03 DIAGNOSIS — M5010 Cervical disc disorder with radiculopathy, unspecified cervical region: Secondary | ICD-10-CM | POA: Diagnosis not present

## 2015-09-03 DIAGNOSIS — N184 Chronic kidney disease, stage 4 (severe): Secondary | ICD-10-CM | POA: Diagnosis not present

## 2015-09-03 DIAGNOSIS — Z435 Encounter for attention to cystostomy: Secondary | ICD-10-CM | POA: Diagnosis not present

## 2015-09-03 DIAGNOSIS — Z7982 Long term (current) use of aspirin: Secondary | ICD-10-CM | POA: Diagnosis not present

## 2015-09-03 DIAGNOSIS — I5032 Chronic diastolic (congestive) heart failure: Secondary | ICD-10-CM | POA: Diagnosis not present

## 2015-09-03 DIAGNOSIS — R339 Retention of urine, unspecified: Secondary | ICD-10-CM | POA: Diagnosis not present

## 2015-09-03 DIAGNOSIS — E1151 Type 2 diabetes mellitus with diabetic peripheral angiopathy without gangrene: Secondary | ICD-10-CM | POA: Diagnosis not present

## 2015-09-03 DIAGNOSIS — E785 Hyperlipidemia, unspecified: Secondary | ICD-10-CM | POA: Diagnosis not present

## 2015-09-03 DIAGNOSIS — E1122 Type 2 diabetes mellitus with diabetic chronic kidney disease: Secondary | ICD-10-CM | POA: Diagnosis not present

## 2015-09-03 DIAGNOSIS — E1140 Type 2 diabetes mellitus with diabetic neuropathy, unspecified: Secondary | ICD-10-CM | POA: Diagnosis not present

## 2015-09-03 DIAGNOSIS — I130 Hypertensive heart and chronic kidney disease with heart failure and stage 1 through stage 4 chronic kidney disease, or unspecified chronic kidney disease: Secondary | ICD-10-CM | POA: Diagnosis not present

## 2015-09-03 NOTE — Telephone Encounter (Signed)
Patient used to be my patient.  He does have some transportation issues.  It sounds like he's possibly now in a living facility?  Because I've known him as a patient, I'll give these as verbal orders.  However, I agree with Dr. Alanda Slim that he needs to come in and be seen to a) meet his new PCP and b) follow-up on chronic medical issues.  I'll forward this note to Dr. Alanda Slim so he knows.

## 2015-09-03 NOTE — Telephone Encounter (Signed)
I would be happy to but I have never seen him in clinic to authorize this order. Can he come in to be seen? Thanks! Alwyn Ren

## 2015-09-03 NOTE — Telephone Encounter (Signed)
Contacted pt to be sure he had received the message form Dr. Alanda Slim and he said he had and that it wasn't a hemorrhoid, also not to worry about the personal care forms he said he had been denied due to income. Confirmed appointment on the 09/26/15 with Dr. Alanda Slim. Lawrence [name], April D, [place]

## 2015-09-03 NOTE — Telephone Encounter (Signed)
Lori a Engineer, civil (consulting) at [employer] wanting to get verbal orders for pt, wants to see him once a week for 9 weeks to care for 2 pressure ulcers that the pt has on left buttocks. Stage 2 and they are tiny. Please advise and call her back. Deseree Bruna Potter, CMA

## 2015-09-03 NOTE — Telephone Encounter (Signed)
LVM for Lawson Fiscal to call back to give the verbal order she requested, also to tell her to let pt know he needs to make an appointment to be seen to meet his new pcp and to follow up on chronic medical issues. [name], Yzabella Crunk D, [place]

## 2015-09-04 DIAGNOSIS — R339 Retention of urine, unspecified: Secondary | ICD-10-CM | POA: Diagnosis not present

## 2015-09-04 DIAGNOSIS — M5010 Cervical disc disorder with radiculopathy, unspecified cervical region: Secondary | ICD-10-CM | POA: Diagnosis not present

## 2015-09-04 DIAGNOSIS — I130 Hypertensive heart and chronic kidney disease with heart failure and stage 1 through stage 4 chronic kidney disease, or unspecified chronic kidney disease: Secondary | ICD-10-CM | POA: Diagnosis not present

## 2015-09-04 DIAGNOSIS — I5032 Chronic diastolic (congestive) heart failure: Secondary | ICD-10-CM | POA: Diagnosis not present

## 2015-09-04 DIAGNOSIS — Z435 Encounter for attention to cystostomy: Secondary | ICD-10-CM | POA: Diagnosis not present

## 2015-09-04 DIAGNOSIS — E1151 Type 2 diabetes mellitus with diabetic peripheral angiopathy without gangrene: Secondary | ICD-10-CM | POA: Diagnosis not present

## 2015-09-04 DIAGNOSIS — Z7982 Long term (current) use of aspirin: Secondary | ICD-10-CM | POA: Diagnosis not present

## 2015-09-04 DIAGNOSIS — E1122 Type 2 diabetes mellitus with diabetic chronic kidney disease: Secondary | ICD-10-CM | POA: Diagnosis not present

## 2015-09-04 DIAGNOSIS — L89322 Pressure ulcer of left buttock, stage 2: Secondary | ICD-10-CM | POA: Diagnosis not present

## 2015-09-04 DIAGNOSIS — E785 Hyperlipidemia, unspecified: Secondary | ICD-10-CM | POA: Diagnosis not present

## 2015-09-04 DIAGNOSIS — E1140 Type 2 diabetes mellitus with diabetic neuropathy, unspecified: Secondary | ICD-10-CM | POA: Diagnosis not present

## 2015-09-04 DIAGNOSIS — N184 Chronic kidney disease, stage 4 (severe): Secondary | ICD-10-CM | POA: Diagnosis not present

## 2015-09-05 ENCOUNTER — Encounter: Payer: Self-pay | Admitting: Vascular Surgery

## 2015-09-05 DIAGNOSIS — N184 Chronic kidney disease, stage 4 (severe): Secondary | ICD-10-CM | POA: Diagnosis not present

## 2015-09-05 DIAGNOSIS — I5032 Chronic diastolic (congestive) heart failure: Secondary | ICD-10-CM | POA: Diagnosis not present

## 2015-09-05 DIAGNOSIS — I130 Hypertensive heart and chronic kidney disease with heart failure and stage 1 through stage 4 chronic kidney disease, or unspecified chronic kidney disease: Secondary | ICD-10-CM | POA: Diagnosis not present

## 2015-09-05 DIAGNOSIS — E1151 Type 2 diabetes mellitus with diabetic peripheral angiopathy without gangrene: Secondary | ICD-10-CM | POA: Diagnosis not present

## 2015-09-05 DIAGNOSIS — M5010 Cervical disc disorder with radiculopathy, unspecified cervical region: Secondary | ICD-10-CM | POA: Diagnosis not present

## 2015-09-05 DIAGNOSIS — E1140 Type 2 diabetes mellitus with diabetic neuropathy, unspecified: Secondary | ICD-10-CM | POA: Diagnosis not present

## 2015-09-05 DIAGNOSIS — Z7982 Long term (current) use of aspirin: Secondary | ICD-10-CM | POA: Diagnosis not present

## 2015-09-05 DIAGNOSIS — E1122 Type 2 diabetes mellitus with diabetic chronic kidney disease: Secondary | ICD-10-CM | POA: Diagnosis not present

## 2015-09-05 DIAGNOSIS — E785 Hyperlipidemia, unspecified: Secondary | ICD-10-CM | POA: Diagnosis not present

## 2015-09-05 DIAGNOSIS — Z435 Encounter for attention to cystostomy: Secondary | ICD-10-CM | POA: Diagnosis not present

## 2015-09-05 DIAGNOSIS — R339 Retention of urine, unspecified: Secondary | ICD-10-CM | POA: Diagnosis not present

## 2015-09-05 DIAGNOSIS — L89322 Pressure ulcer of left buttock, stage 2: Secondary | ICD-10-CM | POA: Diagnosis not present

## 2015-09-05 NOTE — Telephone Encounter (Signed)
LVM for Lawson Fiscal to call back to give her the below information. [name], April D, [place]

## 2015-09-05 NOTE — Telephone Encounter (Signed)
Lawrence [name] called back and was given the below message about verbal orders. [name], April D, [place]

## 2015-09-09 ENCOUNTER — Emergency Department (HOSPITAL_COMMUNITY)
Admission: EM | Admit: 2015-09-09 | Discharge: 2015-09-09 | Disposition: A | Discharge status: Home / Self Care | Payer: Medicare Other | Attending: Physician Assistant | Admitting: Physician Assistant

## 2015-09-09 ENCOUNTER — Encounter (HOSPITAL_COMMUNITY): Payer: Self-pay | Admitting: *Deleted

## 2015-09-09 DIAGNOSIS — E1122 Type 2 diabetes mellitus with diabetic chronic kidney disease: Secondary | ICD-10-CM | POA: Diagnosis not present

## 2015-09-09 DIAGNOSIS — E1151 Type 2 diabetes mellitus with diabetic peripheral angiopathy without gangrene: Secondary | ICD-10-CM | POA: Diagnosis not present

## 2015-09-09 DIAGNOSIS — E1140 Type 2 diabetes mellitus with diabetic neuropathy, unspecified: Secondary | ICD-10-CM | POA: Diagnosis not present

## 2015-09-09 DIAGNOSIS — R030 Elevated blood-pressure reading, without diagnosis of hypertension: Secondary | ICD-10-CM | POA: Diagnosis not present

## 2015-09-09 DIAGNOSIS — I10 Essential (primary) hypertension: Secondary | ICD-10-CM | POA: Diagnosis not present

## 2015-09-09 DIAGNOSIS — I130 Hypertensive heart and chronic kidney disease with heart failure and stage 1 through stage 4 chronic kidney disease, or unspecified chronic kidney disease: Secondary | ICD-10-CM | POA: Insufficient documentation

## 2015-09-09 DIAGNOSIS — N184 Chronic kidney disease, stage 4 (severe): Secondary | ICD-10-CM | POA: Diagnosis not present

## 2015-09-09 DIAGNOSIS — R339 Retention of urine, unspecified: Secondary | ICD-10-CM | POA: Diagnosis not present

## 2015-09-09 DIAGNOSIS — L89322 Pressure ulcer of left buttock, stage 2: Secondary | ICD-10-CM | POA: Diagnosis not present

## 2015-09-09 DIAGNOSIS — E1022 Type 1 diabetes mellitus with diabetic chronic kidney disease: Secondary | ICD-10-CM | POA: Insufficient documentation

## 2015-09-09 DIAGNOSIS — Z7982 Long term (current) use of aspirin: Secondary | ICD-10-CM | POA: Insufficient documentation

## 2015-09-09 DIAGNOSIS — I509 Heart failure, unspecified: Secondary | ICD-10-CM | POA: Diagnosis not present

## 2015-09-09 DIAGNOSIS — E785 Hyperlipidemia, unspecified: Secondary | ICD-10-CM | POA: Diagnosis not present

## 2015-09-09 DIAGNOSIS — Z79899 Other long term (current) drug therapy: Secondary | ICD-10-CM | POA: Diagnosis not present

## 2015-09-09 DIAGNOSIS — M5010 Cervical disc disorder with radiculopathy, unspecified cervical region: Secondary | ICD-10-CM | POA: Diagnosis not present

## 2015-09-09 DIAGNOSIS — Z435 Encounter for attention to cystostomy: Secondary | ICD-10-CM | POA: Diagnosis not present

## 2015-09-09 DIAGNOSIS — R2689 Other abnormalities of gait and mobility: Secondary | ICD-10-CM | POA: Diagnosis not present

## 2015-09-09 DIAGNOSIS — I5032 Chronic diastolic (congestive) heart failure: Secondary | ICD-10-CM | POA: Diagnosis not present

## 2015-09-09 LAB — CBC WITH DIFFERENTIAL/PLATELET
BASOS ABS: 0 10*3/uL (ref 0.0–0.1)
Basophils Relative: 1 %
Eosinophils Absolute: 0.2 10*3/uL (ref 0.0–0.7)
Eosinophils Relative: 4 %
HEMATOCRIT: 28.3 % — AB (ref 39.0–52.0)
HEMOGLOBIN: 9 g/dL — AB (ref 13.0–17.0)
LYMPHS PCT: 17 %
Lymphs Abs: 1.1 10*3/uL (ref 0.7–4.0)
MCH: 26.9 pg (ref 26.0–34.0)
MCHC: 31.8 g/dL (ref 30.0–36.0)
MCV: 84.7 fL (ref 78.0–100.0)
Monocytes Absolute: 0.7 10*3/uL (ref 0.1–1.0)
Monocytes Relative: 10 %
NEUTROS ABS: 4.4 10*3/uL (ref 1.7–7.7)
NEUTROS PCT: 69 %
PLATELETS: 129 10*3/uL — AB (ref 150–400)
RBC: 3.34 MIL/uL — AB (ref 4.22–5.81)
RDW: 14.5 % (ref 11.5–15.5)
WBC: 6.4 10*3/uL (ref 4.0–10.5)

## 2015-09-09 LAB — COMPREHENSIVE METABOLIC PANEL
ALT: 9 U/L — AB (ref 17–63)
AST: 12 U/L — AB (ref 15–41)
Albumin: 2.6 g/dL — ABNORMAL LOW (ref 3.5–5.0)
Alkaline Phosphatase: 100 U/L (ref 38–126)
Anion gap: 6 (ref 5–15)
BILIRUBIN TOTAL: 0.2 mg/dL — AB (ref 0.3–1.2)
BUN: 47 mg/dL — AB (ref 6–20)
CHLORIDE: 112 mmol/L — AB (ref 101–111)
CO2: 23 mmol/L (ref 22–32)
CREATININE: 3.36 mg/dL — AB (ref 0.61–1.24)
Calcium: 9 mg/dL (ref 8.9–10.3)
GFR calc Af Amer: 22 mL/min — ABNORMAL LOW (ref >60)
GFR, EST NON AFRICAN AMERICAN: 19 mL/min — AB (ref >60)
Glucose, Bld: 90 mg/dL (ref 65–99)
Potassium: 4 mmol/L (ref 3.5–5.1)
Sodium: 141 mmol/L (ref 135–145)
Total Protein: 5.5 g/dL — ABNORMAL LOW (ref 6.5–8.1)

## 2015-09-09 LAB — I-STAT TROPONIN, ED: TROPONIN I, POC: 0.03 ng/mL (ref 0.00–0.08)

## 2015-09-09 LAB — LIPASE, BLOOD: Lipase: 12 U/L (ref 11–51)

## 2015-09-09 NOTE — ED Notes (Signed)
Called [facility] for transport.  

## 2015-09-09 NOTE — ED Triage Notes (Signed)
Pt arrives via EMS from home. pts home health nurse found his BP to be 200/80. EMS found BP to be 182/90 HR 120. Pt states he has been taking his BP meds.  Pt refuses to wear a hospital gown.

## 2015-09-09 NOTE — ED Provider Notes (Signed)
[facility] DEPT Provider Note   CSN: [id number] Arrival date & time: 09/09/15  1154  First Provider Contact:  First MD Initiated Contact with Patient 09/09/15 1249        History   Chief Complaint Chief Complaint  Patient presents with  . Hypertension    HPI Lawrence [name] is a 55 y.o. male.  HPI   Pt is a very pleasant 55 yo male with ho CHF, HTN, DM, GERD, here with isolate asymptomatic htn.  Pt's home health nurse checked BP, found it to be elevated, Sent him here.   No CP, headache, difficulty urinating.  I noticed patient's jaundice on exam and he stated "peopel have been saying that the last couple of weeks".   Past Medical History:  Diagnosis Date  . CHF (congestive heart failure) ([place])   . Chronic kidney disease   . Depression 08/2012   "just when foot first got cut off"  . Fistula   . GERD (gastroesophageal reflux disease)   . H/O hiatal hernia    hx of years ago   . Hypercholesteremia   . Hypertension   . IDDM (insulin dependent diabetes mellitus) ([place])   . Neuropathy ([place])   . Peripheral vascular disease ([place])   . Pneumonia 10/23/2013  . PONV (postoperative nausea and vomiting)     Patient Active Problem List   Diagnosis Date Noted  . Diarrhea 06/10/2015  . Perirectal abscess 05/05/2015  . Depression with anxiety 04/14/2015  . Right hand pain 04/03/2015  . Muscle wasting and atrophy, not elsewhere classified, right upper arm   . Protein-calorie malnutrition, severe 03/12/2015  . Dehydration 03/11/2015  . Hemorrhoids 03/11/2015  . Acute renal failure superimposed on stage 4 chronic kidney disease ([name]) 03/11/2015  . Hemorrhoids, external, thrombosed 03/11/2015  . Physical deconditioning 03/11/2015  . Hypertensive heart disease with CHF (congestive heart failure) ([name]) 08/09/2014  . Suprapubic catheter ([name]) 08/09/2014  . Bladder spasms 08/09/2014  . Redness of eye, right 01/15/2014  . Secondary hyperparathyroidism ([name]) 11/28/2013  .  Diastolic heart failure ([name]) 11/12/2013  . Normocytic anemia 10/10/2013  . Tinnitus of both ears 10/09/2013  . Surgical wound dehiscence 10/04/2012  . Dehiscence of amputation stump ([name]) 10/04/2012  . Unilateral complete BKA--right 08/22/2012  . Osteomyelitis of right foot ([name]) 08/17/2012    Class: Diagnosis of  . Chronic cough 02/22/2012  . Hematuria 03/23/2011  . Prostate asymmetry 03/23/2011  . Neuropathy ([name]) 08/25/2010  . Chronic kidney disease (CKD), stage 4 03/14/2009  . DM (diabetes mellitus), type 2, uncontrolled ([name]) 12/26/2007  . HYPERCHOLESTEROLEMIA 12/26/2007    Past Surgical History:  Procedure Laterality Date  . ABDOMINAL AORTAGRAM N/A 05/03/2012   Procedure: ABDOMINAL Maxcine Ham;  Surgeon: Elam Dutch, MD;  Location: [facility] CATH LAB;  Service: Cardiovascular;  Laterality: N/A;  . AMPUTATION Right 05/03/2012   Procedure: Right [place] AMPUTATION;  Surgeon: Marybelle Killings, MD;  Location: [facility];  Service: Orthopedics;  Laterality: Right;  . AMPUTATION Right 08/16/2012   Procedure: AMPUTATION BELOW KNEE;  Surgeon: Marybelle Killings, MD;  Location: [facility];  Service: Orthopedics;  Laterality: Right;  Right Below Knee Amputation  . AMPUTATION Right 10/04/2012   Procedure: AMPUTATION BELOW KNEE REVISION;  Surgeon: Marybelle Killings, MD;  Location: [facility];  Service: Orthopedics;  Laterality: Right;  . AV FISTULA PLACEMENT Right 11/01/2013   Procedure: RIGHT ARM ARTERIOVENOUS (AV) FISTULA CREATION;  Surgeon: Angelia Mould, MD;  Location: [facility];  Service: Vascular;  Laterality:  Right;  . I&D EXTREMITY Right 05/29/2012   Procedure: IRRIGATION AND DEBRIDEMENT EXTREMITY- right foot;  Surgeon: Marybelle Killings, MD;  Location: [facility];  Service: Orthopedics;  Laterality: Right;  Right Foot Debridement, VAC Application  . INSERTION OF SUPRAPUBIC CATHETER N/A 03/02/2013   Procedure: INSERTION OF SUPRAPUBIC CATHETER;  Surgeon: Lowella Bandy, MD;  Location: [facility];  Service: Urology;  Laterality: N/A;  .  LIGATION OF ARTERIOVENOUS  FISTULA Right 08/08/2015   Procedure: LIGATION OF ARTERIOVENOUS  FISTULA- right brachiocephalic;  Surgeon: Mal Misty, MD;  Location: [facility];  Service: Vascular;  Laterality: Right;  . LOWER EXTREMITY ANGIOGRAM Right 05/03/2012   Procedure: LOWER EXTREMITY ANGIOGRAM;  Surgeon: Elam Dutch, MD;  Location: [facility] CATH LAB;  Service: Cardiovascular;  Laterality: Right;  rt leg angio  . TONSILLECTOMY  ~ [place] Medications    Prior to Admission medications   Medication Sig Start Date End Date Taking? Authorizing Provider  aspirin 81 MG tablet Take 1 tablet (81 mg total) by mouth daily. Patient taking differently: Take 81 mg by mouth every morning.  07/31/15  Yes Alveda Reasons, MD  atorvastatin (LIPITOR) 40 MG tablet Take 1 tablet by mouth  daily 06/15/15  Yes Alveda Reasons, MD  bisacodyl (DULCOLAX) 5 MG EC tablet Take 5 mg by mouth daily as needed for moderate constipation.   Yes Historical Provider, MD  Brinzolamide-Brimonidine [facility]) 1-0.2 % SUSP Place 1 drop into the left eye 2 (two) times daily.   Yes Historical Provider, MD  calcitRIOL (ROCALTROL) 0.25 MCG capsule Take 1 capsule (0.25 mcg total) by mouth daily. Patient taking differently: Take 0.25 mcg by mouth every Monday, Wednesday, and Friday.  11/01/13  Yes Vivi Barrack, MD  furosemide (LASIX) 40 MG tablet Take 40 mg by mouth 2 (two) times daily. 07/11/15  Yes Historical Provider, MD  gabapentin (NEURONTIN) 100 MG capsule Take 1 capsule by mouth 3  times daily 07/16/15  Yes Alveda Reasons, MD  Insulin Glargine (LANTUS SOLOSTAR) 100 UNIT/ML Solostar Pen Inject 15 Units into the skin daily at 10 pm. 06/10/15  Yes Alveda Reasons, MD  Insulin Pen Needle (EASY TOUCH PEN NEEDLES) 31G X 8 MM MISC USE TO INJECT INSULIN once daily subcutaneously 06/05/15  Yes Alveda Reasons, MD  Lidocaine-Hydrocortisone Ace 3-2.5 % KIT Place 1 applicator rectally 2 (two) times daily. 08/18/15  Yes Mercy Riding, MD    metoprolol tartrate (LOPRESSOR) 25 MG tablet Take 1 tablet by mouth two  times daily 06/15/15  Yes Alveda Reasons, MD  Misc. Devices [facility]) [place] Standard wheelchair. Use as needed for immobility 07/03/15  Yes Leeanne Rio, MD  Psyllium 100 % POWD Take 3.3 g (1 rounded teaspoon) dissolved in 8 ounces of liquid; may increase gradually up to 3 doses daily 08/18/15  Yes Mercy Riding, MD  solifenacin (VESICARE) 5 MG tablet Take 5 mg by mouth daily.   Yes Historical Provider, MD  Flossie Buffy INSULIN SYRINGE 30G X 1/2" 0.3 ML MISC USE ONE SYRINGE AT BEDTIME 11/15/13  Yes Willeen Niece, MD    Family History Family History  Problem Relation Age of Onset  . Stroke Mother   . Cancer Father     Unsure of type    Social History Social History  Substance Use Topics  . Smoking status: Never Smoker  . Smokeless tobacco: Never Used  . Alcohol use 0.0 oz/week  Comment: rare can of beer     Allergies   Review of patient's allergies indicates no known allergies.   Review of Systems Review of Systems  Constitutional: Negative for activity change.  Respiratory: Negative for shortness of breath.   Cardiovascular: Negative for chest pain and leg swelling.  Gastrointestinal: Negative for abdominal pain.  All other systems reviewed and are negative.    Physical Exam Updated Vital Signs BP (!) 164/54   Pulse 61   Temp 98.3 F (36.8 C) (Oral)   Resp 26   Ht _0  (1.651 m)   Wt 170 lb (77.1 kg)   SpO2 99%   BMI 28.29 kg/m   Physical Exam  Constitutional: He is oriented to person, place, and time. He appears well-nourished.  Mild jaundice  HENT:  Head: Normocephalic.  Mouth/Throat: Oropharynx is clear and moist.  Eyes: Conjunctivae are normal.  Neck: No tracheal deviation present.  Cardiovascular: Normal rate.   Pulmonary/Chest: Effort normal. No stridor. No respiratory distress.  Abdominal: Soft. There is no tenderness. There is no guarding.  Old scars suprapubic cath   Musculoskeletal: Normal range of motion. He exhibits no edema.  Missing multiple distal digits/extremity atrophy,  Neurological: He is oriented to person, place, and time. No cranial nerve deficit.  Skin: Skin is warm and dry. No rash noted. He is not diaphoretic.  Psychiatric: He has a normal mood and affect. His behavior is normal.  Nursing note and vitals reviewed.    ED Treatments / Results  Labs (all labs ordered are listed, but only abnormal results are displayed) Labs Reviewed  CBC WITH DIFFERENTIAL/PLATELET - Abnormal; Notable for the following:       Result Value   RBC 3.34 (*)    Hemoglobin 9.0 (*)    HCT 28.3 (*)    Platelets 129 (*)    All other components within normal limits  COMPREHENSIVE METABOLIC PANEL - Abnormal; Notable for the following:    Chloride 112 (*)    BUN 47 (*)    Creatinine, Ser 3.36 (*)    Total Protein 5.5 (*)    Albumin 2.6 (*)    AST 12 (*)    ALT 9 (*)    Total Bilirubin 0.2 (*)    GFR calc non Af Amer 19 (*)    GFR calc Af Amer 22 (*)    All other components within normal limits  LIPASE, BLOOD  I-STAT TROPOININ, ED    EKG  EKG Interpretation  Date/Time:  Tuesday September 09 2015 12:13:50 EDT Ventricular Rate:  57 PR Interval:    QRS Duration: 100 QT Interval:  464 QTC Calculation: 452 R Axis:   -15 Text Interpretation:  Sinus rhythm Borderline prolonged PR interval Borderline left axis deviation Borderline low voltage, extremity leads no acute ischemia.  Normal sinus rhythm Confirmed by Thomasene Lot, [place] ([id number]) on 09/09/2015 12:56:29 PM       Radiology No results found.  Procedures Procedures (including critical care time)  Medications Ordered in ED Medications - No data to display   Initial Impression / Assessment and Plan / ED Course  I have reviewed the triage vital signs and the nursing notes.  Pertinent labs & imaging results that were available during my care of the patient were reviewed by me and considered in  my medical decision making (see chart for details).  Clinical Course    Pt is a pleasant 55 yo male presenting with asymptomatic htn. According to ACEP guidelines, no workup needs  to be done, however he looks jaundice no exam. We will do labs, including troponin, EKG and check liver enzymes.  Physical exam baseline and BP elevated.   2:20 PM All labs appear baseline. Will discharge to follow up with PCP about BP.   Final Clinical Impressions(s) / ED Diagnoses   Final diagnoses:  None    New Prescriptions New Prescriptions   No medications on file     Wanell Lorenzi Julio Alm, MD 09/09/15 1421

## 2015-09-09 NOTE — Discharge Instructions (Signed)
Record your blood pressures, keep a journal and follow up with youru regular doctor.  Please return with any chest pain or other concerns.

## 2015-09-10 DIAGNOSIS — H4089 Other specified glaucoma: Secondary | ICD-10-CM | POA: Diagnosis not present

## 2015-09-10 DIAGNOSIS — L89322 Pressure ulcer of left buttock, stage 2: Secondary | ICD-10-CM | POA: Diagnosis not present

## 2015-09-10 DIAGNOSIS — E1151 Type 2 diabetes mellitus with diabetic peripheral angiopathy without gangrene: Secondary | ICD-10-CM | POA: Diagnosis not present

## 2015-09-10 DIAGNOSIS — E1140 Type 2 diabetes mellitus with diabetic neuropathy, unspecified: Secondary | ICD-10-CM | POA: Diagnosis not present

## 2015-09-10 DIAGNOSIS — H4060X2 Glaucoma secondary to drugs, unspecified eye, moderate stage: Secondary | ICD-10-CM | POA: Diagnosis not present

## 2015-09-10 DIAGNOSIS — H04123 Dry eye syndrome of bilateral lacrimal glands: Secondary | ICD-10-CM | POA: Diagnosis not present

## 2015-09-10 DIAGNOSIS — N184 Chronic kidney disease, stage 4 (severe): Secondary | ICD-10-CM | POA: Diagnosis not present

## 2015-09-10 DIAGNOSIS — M5010 Cervical disc disorder with radiculopathy, unspecified cervical region: Secondary | ICD-10-CM | POA: Diagnosis not present

## 2015-09-10 DIAGNOSIS — Z435 Encounter for attention to cystostomy: Secondary | ICD-10-CM | POA: Diagnosis not present

## 2015-09-10 DIAGNOSIS — I5032 Chronic diastolic (congestive) heart failure: Secondary | ICD-10-CM | POA: Diagnosis not present

## 2015-09-10 DIAGNOSIS — I130 Hypertensive heart and chronic kidney disease with heart failure and stage 1 through stage 4 chronic kidney disease, or unspecified chronic kidney disease: Secondary | ICD-10-CM | POA: Diagnosis not present

## 2015-09-10 DIAGNOSIS — H44521 Atrophy of globe, right eye: Secondary | ICD-10-CM | POA: Diagnosis not present

## 2015-09-10 DIAGNOSIS — E785 Hyperlipidemia, unspecified: Secondary | ICD-10-CM | POA: Diagnosis not present

## 2015-09-10 DIAGNOSIS — E1122 Type 2 diabetes mellitus with diabetic chronic kidney disease: Secondary | ICD-10-CM | POA: Diagnosis not present

## 2015-09-10 DIAGNOSIS — E113551 Type 2 diabetes mellitus with stable proliferative diabetic retinopathy, right eye: Secondary | ICD-10-CM | POA: Diagnosis not present

## 2015-09-10 DIAGNOSIS — Z7982 Long term (current) use of aspirin: Secondary | ICD-10-CM | POA: Diagnosis not present

## 2015-09-10 DIAGNOSIS — R339 Retention of urine, unspecified: Secondary | ICD-10-CM | POA: Diagnosis not present

## 2015-09-11 ENCOUNTER — Encounter: Payer: Self-pay | Admitting: Vascular Surgery

## 2015-09-11 ENCOUNTER — Ambulatory Visit (INDEPENDENT_AMBULATORY_CARE_PROVIDER_SITE_OTHER): Payer: Medicare Other | Admitting: Vascular Surgery

## 2015-09-11 VITALS — BP 152/67 | HR 54 | Temp 98.0°F | Resp 16 | Ht 65.0 in | Wt 170.0 lb

## 2015-09-11 DIAGNOSIS — E1140 Type 2 diabetes mellitus with diabetic neuropathy, unspecified: Secondary | ICD-10-CM | POA: Diagnosis not present

## 2015-09-11 DIAGNOSIS — E785 Hyperlipidemia, unspecified: Secondary | ICD-10-CM | POA: Diagnosis not present

## 2015-09-11 DIAGNOSIS — E1151 Type 2 diabetes mellitus with diabetic peripheral angiopathy without gangrene: Secondary | ICD-10-CM | POA: Diagnosis not present

## 2015-09-11 DIAGNOSIS — N185 Chronic kidney disease, stage 5: Secondary | ICD-10-CM

## 2015-09-11 DIAGNOSIS — I130 Hypertensive heart and chronic kidney disease with heart failure and stage 1 through stage 4 chronic kidney disease, or unspecified chronic kidney disease: Secondary | ICD-10-CM | POA: Diagnosis not present

## 2015-09-11 DIAGNOSIS — E1122 Type 2 diabetes mellitus with diabetic chronic kidney disease: Secondary | ICD-10-CM | POA: Diagnosis not present

## 2015-09-11 DIAGNOSIS — Z435 Encounter for attention to cystostomy: Secondary | ICD-10-CM | POA: Diagnosis not present

## 2015-09-11 DIAGNOSIS — M5010 Cervical disc disorder with radiculopathy, unspecified cervical region: Secondary | ICD-10-CM | POA: Diagnosis not present

## 2015-09-11 DIAGNOSIS — N184 Chronic kidney disease, stage 4 (severe): Secondary | ICD-10-CM | POA: Diagnosis not present

## 2015-09-11 DIAGNOSIS — L89322 Pressure ulcer of left buttock, stage 2: Secondary | ICD-10-CM | POA: Diagnosis not present

## 2015-09-11 DIAGNOSIS — I5032 Chronic diastolic (congestive) heart failure: Secondary | ICD-10-CM | POA: Diagnosis not present

## 2015-09-11 DIAGNOSIS — Z7982 Long term (current) use of aspirin: Secondary | ICD-10-CM | POA: Diagnosis not present

## 2015-09-11 DIAGNOSIS — R339 Retention of urine, unspecified: Secondary | ICD-10-CM | POA: Diagnosis not present

## 2015-09-11 NOTE — Progress Notes (Signed)
Patient name: Lawrence [name] MRN: [id number] DOB: 1961/01/01 Sex: male  REASON FOR VISIT: All up recent ligation of right arm AV fistula for steal  HPI: Lawrence [name] is a 55 y.o. male who had the Rowe Robert in his right arm AV fistula. He had an unusual situation that he had this place and year and a half prior. He had presented with numbness and weakness and fracture in his right hand. He underwent outpatient ligation of this fistula with Dr. Kellie Simmering on 08/08/2015.  Current Outpatient Prescriptions  Medication Sig Dispense Refill  . aspirin 81 MG tablet Take 1 tablet (81 mg total) by mouth daily. (Patient taking differently: Take 81 mg by mouth every morning. ) 90 tablet 3  . atorvastatin (LIPITOR) 40 MG tablet Take 1 tablet by mouth  daily 90 tablet 2  . bisacodyl (DULCOLAX) 5 MG EC tablet Take 5 mg by mouth daily as needed for moderate constipation.    . Brinzolamide-Brimonidine (SIMBRINZA) 1-0.2 % SUSP Place 1 drop into the left eye 2 (two) times daily.    . calcitRIOL (ROCALTROL) 0.25 MCG capsule Take 1 capsule (0.25 mcg total) by mouth daily. (Patient taking differently: Take 0.25 mcg by mouth every Monday, Wednesday, and Friday. ) 30 capsule 0  . furosemide (LASIX) 40 MG tablet Take 40 mg by mouth 2 (two) times daily.    Marland Kitchen gabapentin (NEURONTIN) 100 MG capsule Take 1 capsule by mouth 3  times daily 180 capsule 2  . Insulin Glargine (LANTUS SOLOSTAR) 100 UNIT/ML Solostar Pen Inject 15 Units into the skin daily at 10 pm. 15 mL 11  . Insulin Pen Needle (EASY TOUCH PEN NEEDLES) 31G X 8 MM MISC USE TO INJECT INSULIN once daily subcutaneously 100 each 4  . Lidocaine-Hydrocortisone Ace 3-2.5 % KIT Place 1 applicator rectally 2 (two) times daily. 1 each 0  . metoprolol tartrate (LOPRESSOR) 25 MG tablet Take 1 tablet by mouth two  times daily 180 tablet 2  . Misc. Devices [facility]) [place] Standard wheelchair. Use as needed for immobility 1 each 0  . Psyllium 100 %  POWD Take 3.3 g (1 rounded teaspoon) dissolved in 8 ounces of liquid; may increase gradually up to 3 doses daily 226 g 0  . solifenacin (VESICARE) 5 MG tablet Take 5 mg by mouth daily.    Marland Kitchen ULTICARE INSULIN SYRINGE 30G X 1/2" 0.3 ML MISC USE ONE SYRINGE AT BEDTIME 100 each 0   No current facility-administered medications for this visit.      PHYSICAL EXAM: Vitals:   09/11/15 0952 09/11/15 0958  BP: (!) 167/65 (!) 152/67  Pulse: (!) 54 (!) 54  Resp: 16   Temp: 98 F (36.7 C)   SpO2: 99%   Weight: 170 lb (77.1 kg)   Height: 5' 5"  (1.651 m)     GENERAL: The patient is a well-nourished male, in no acute distress. The vital signs are documented above. His right arm incision is completely healed. He does have a easily palpable 2+ right radial pulse. He continues to have severe contracture in his right hand  MEDICAL ISSUES: Fortunately the patient has no need currently for hemodialysis access. He will be a very difficult access challenge if it comes to this. He has some disability in his left arm. He is working with occupational therapy and physical therapy for other issues and they are working on the contracture in his right hand as well. He will continue these activities and will see [place] again on  an as-needed basis   Rosetta Posner, MD [facility] Tel [phone] Pager [phone]

## 2015-09-11 NOTE — Progress Notes (Signed)
Vitals:   09/11/15 0952  BP: (!) 167/65  Pulse: (!) 54  Resp: 16  Temp: 98 F (36.7 C)  SpO2: 99%  Weight: 170 lb (77.1 kg)  Height: 5\' 5"  (1.651 m)

## 2015-09-12 DIAGNOSIS — M5010 Cervical disc disorder with radiculopathy, unspecified cervical region: Secondary | ICD-10-CM | POA: Diagnosis not present

## 2015-09-12 DIAGNOSIS — L89322 Pressure ulcer of left buttock, stage 2: Secondary | ICD-10-CM | POA: Diagnosis not present

## 2015-09-12 DIAGNOSIS — Z435 Encounter for attention to cystostomy: Secondary | ICD-10-CM | POA: Diagnosis not present

## 2015-09-12 DIAGNOSIS — E785 Hyperlipidemia, unspecified: Secondary | ICD-10-CM | POA: Diagnosis not present

## 2015-09-12 DIAGNOSIS — E1122 Type 2 diabetes mellitus with diabetic chronic kidney disease: Secondary | ICD-10-CM | POA: Diagnosis not present

## 2015-09-12 DIAGNOSIS — N184 Chronic kidney disease, stage 4 (severe): Secondary | ICD-10-CM | POA: Diagnosis not present

## 2015-09-12 DIAGNOSIS — Z7982 Long term (current) use of aspirin: Secondary | ICD-10-CM | POA: Diagnosis not present

## 2015-09-12 DIAGNOSIS — I130 Hypertensive heart and chronic kidney disease with heart failure and stage 1 through stage 4 chronic kidney disease, or unspecified chronic kidney disease: Secondary | ICD-10-CM | POA: Diagnosis not present

## 2015-09-12 DIAGNOSIS — I5032 Chronic diastolic (congestive) heart failure: Secondary | ICD-10-CM | POA: Diagnosis not present

## 2015-09-12 DIAGNOSIS — R339 Retention of urine, unspecified: Secondary | ICD-10-CM | POA: Diagnosis not present

## 2015-09-12 DIAGNOSIS — E1151 Type 2 diabetes mellitus with diabetic peripheral angiopathy without gangrene: Secondary | ICD-10-CM | POA: Diagnosis not present

## 2015-09-12 DIAGNOSIS — E1140 Type 2 diabetes mellitus with diabetic neuropathy, unspecified: Secondary | ICD-10-CM | POA: Diagnosis not present

## 2015-09-15 DIAGNOSIS — L89322 Pressure ulcer of left buttock, stage 2: Secondary | ICD-10-CM | POA: Diagnosis not present

## 2015-09-15 DIAGNOSIS — M5010 Cervical disc disorder with radiculopathy, unspecified cervical region: Secondary | ICD-10-CM | POA: Diagnosis not present

## 2015-09-15 DIAGNOSIS — I5032 Chronic diastolic (congestive) heart failure: Secondary | ICD-10-CM | POA: Diagnosis not present

## 2015-09-15 DIAGNOSIS — E1122 Type 2 diabetes mellitus with diabetic chronic kidney disease: Secondary | ICD-10-CM | POA: Diagnosis not present

## 2015-09-15 DIAGNOSIS — Z7982 Long term (current) use of aspirin: Secondary | ICD-10-CM | POA: Diagnosis not present

## 2015-09-15 DIAGNOSIS — I130 Hypertensive heart and chronic kidney disease with heart failure and stage 1 through stage 4 chronic kidney disease, or unspecified chronic kidney disease: Secondary | ICD-10-CM | POA: Diagnosis not present

## 2015-09-15 DIAGNOSIS — E1151 Type 2 diabetes mellitus with diabetic peripheral angiopathy without gangrene: Secondary | ICD-10-CM | POA: Diagnosis not present

## 2015-09-15 DIAGNOSIS — E785 Hyperlipidemia, unspecified: Secondary | ICD-10-CM | POA: Diagnosis not present

## 2015-09-15 DIAGNOSIS — N184 Chronic kidney disease, stage 4 (severe): Secondary | ICD-10-CM | POA: Diagnosis not present

## 2015-09-15 DIAGNOSIS — R339 Retention of urine, unspecified: Secondary | ICD-10-CM | POA: Diagnosis not present

## 2015-09-15 DIAGNOSIS — E1140 Type 2 diabetes mellitus with diabetic neuropathy, unspecified: Secondary | ICD-10-CM | POA: Diagnosis not present

## 2015-09-15 DIAGNOSIS — Z435 Encounter for attention to cystostomy: Secondary | ICD-10-CM | POA: Diagnosis not present

## 2015-09-17 DIAGNOSIS — I130 Hypertensive heart and chronic kidney disease with heart failure and stage 1 through stage 4 chronic kidney disease, or unspecified chronic kidney disease: Secondary | ICD-10-CM | POA: Diagnosis not present

## 2015-09-17 DIAGNOSIS — E785 Hyperlipidemia, unspecified: Secondary | ICD-10-CM | POA: Diagnosis not present

## 2015-09-17 DIAGNOSIS — Z435 Encounter for attention to cystostomy: Secondary | ICD-10-CM | POA: Diagnosis not present

## 2015-09-17 DIAGNOSIS — I5032 Chronic diastolic (congestive) heart failure: Secondary | ICD-10-CM | POA: Diagnosis not present

## 2015-09-17 DIAGNOSIS — M5010 Cervical disc disorder with radiculopathy, unspecified cervical region: Secondary | ICD-10-CM | POA: Diagnosis not present

## 2015-09-17 DIAGNOSIS — E1151 Type 2 diabetes mellitus with diabetic peripheral angiopathy without gangrene: Secondary | ICD-10-CM | POA: Diagnosis not present

## 2015-09-17 DIAGNOSIS — N184 Chronic kidney disease, stage 4 (severe): Secondary | ICD-10-CM | POA: Diagnosis not present

## 2015-09-17 DIAGNOSIS — L89322 Pressure ulcer of left buttock, stage 2: Secondary | ICD-10-CM | POA: Diagnosis not present

## 2015-09-17 DIAGNOSIS — R339 Retention of urine, unspecified: Secondary | ICD-10-CM | POA: Diagnosis not present

## 2015-09-17 DIAGNOSIS — E1140 Type 2 diabetes mellitus with diabetic neuropathy, unspecified: Secondary | ICD-10-CM | POA: Diagnosis not present

## 2015-09-17 DIAGNOSIS — Z7982 Long term (current) use of aspirin: Secondary | ICD-10-CM | POA: Diagnosis not present

## 2015-09-17 DIAGNOSIS — E1122 Type 2 diabetes mellitus with diabetic chronic kidney disease: Secondary | ICD-10-CM | POA: Diagnosis not present

## 2015-09-18 ENCOUNTER — Telehealth: Payer: Self-pay | Admitting: Student

## 2015-09-18 DIAGNOSIS — L89322 Pressure ulcer of left buttock, stage 2: Secondary | ICD-10-CM | POA: Diagnosis not present

## 2015-09-18 DIAGNOSIS — E1140 Type 2 diabetes mellitus with diabetic neuropathy, unspecified: Secondary | ICD-10-CM | POA: Diagnosis not present

## 2015-09-18 DIAGNOSIS — M5010 Cervical disc disorder with radiculopathy, unspecified cervical region: Secondary | ICD-10-CM | POA: Diagnosis not present

## 2015-09-18 DIAGNOSIS — E1151 Type 2 diabetes mellitus with diabetic peripheral angiopathy without gangrene: Secondary | ICD-10-CM | POA: Diagnosis not present

## 2015-09-18 DIAGNOSIS — Z7982 Long term (current) use of aspirin: Secondary | ICD-10-CM | POA: Diagnosis not present

## 2015-09-18 DIAGNOSIS — E785 Hyperlipidemia, unspecified: Secondary | ICD-10-CM | POA: Diagnosis not present

## 2015-09-18 DIAGNOSIS — I130 Hypertensive heart and chronic kidney disease with heart failure and stage 1 through stage 4 chronic kidney disease, or unspecified chronic kidney disease: Secondary | ICD-10-CM | POA: Diagnosis not present

## 2015-09-18 DIAGNOSIS — I5032 Chronic diastolic (congestive) heart failure: Secondary | ICD-10-CM | POA: Diagnosis not present

## 2015-09-18 DIAGNOSIS — E1122 Type 2 diabetes mellitus with diabetic chronic kidney disease: Secondary | ICD-10-CM | POA: Diagnosis not present

## 2015-09-18 DIAGNOSIS — N184 Chronic kidney disease, stage 4 (severe): Secondary | ICD-10-CM | POA: Diagnosis not present

## 2015-09-18 DIAGNOSIS — Z435 Encounter for attention to cystostomy: Secondary | ICD-10-CM | POA: Diagnosis not present

## 2015-09-18 DIAGNOSIS — R339 Retention of urine, unspecified: Secondary | ICD-10-CM | POA: Diagnosis not present

## 2015-09-18 NOTE — Telephone Encounter (Signed)
Pt wanted to let his doctor know that his therapist said his bp was running high on the top (180-209), but normal on the bottom. Please advise. Thanks! ep

## 2015-09-19 DIAGNOSIS — N184 Chronic kidney disease, stage 4 (severe): Secondary | ICD-10-CM | POA: Diagnosis not present

## 2015-09-19 DIAGNOSIS — E785 Hyperlipidemia, unspecified: Secondary | ICD-10-CM | POA: Diagnosis not present

## 2015-09-19 DIAGNOSIS — I130 Hypertensive heart and chronic kidney disease with heart failure and stage 1 through stage 4 chronic kidney disease, or unspecified chronic kidney disease: Secondary | ICD-10-CM | POA: Diagnosis not present

## 2015-09-19 DIAGNOSIS — M5010 Cervical disc disorder with radiculopathy, unspecified cervical region: Secondary | ICD-10-CM | POA: Diagnosis not present

## 2015-09-19 DIAGNOSIS — L89322 Pressure ulcer of left buttock, stage 2: Secondary | ICD-10-CM | POA: Diagnosis not present

## 2015-09-19 DIAGNOSIS — E1140 Type 2 diabetes mellitus with diabetic neuropathy, unspecified: Secondary | ICD-10-CM | POA: Diagnosis not present

## 2015-09-19 DIAGNOSIS — R339 Retention of urine, unspecified: Secondary | ICD-10-CM | POA: Diagnosis not present

## 2015-09-19 DIAGNOSIS — E1151 Type 2 diabetes mellitus with diabetic peripheral angiopathy without gangrene: Secondary | ICD-10-CM | POA: Diagnosis not present

## 2015-09-19 DIAGNOSIS — Z7982 Long term (current) use of aspirin: Secondary | ICD-10-CM | POA: Diagnosis not present

## 2015-09-19 DIAGNOSIS — I5032 Chronic diastolic (congestive) heart failure: Secondary | ICD-10-CM | POA: Diagnosis not present

## 2015-09-19 DIAGNOSIS — E1122 Type 2 diabetes mellitus with diabetic chronic kidney disease: Secondary | ICD-10-CM | POA: Diagnosis not present

## 2015-09-19 DIAGNOSIS — Z435 Encounter for attention to cystostomy: Secondary | ICD-10-CM | POA: Diagnosis not present

## 2015-09-19 NOTE — Telephone Encounter (Signed)
Was trying to contact pt to inform him of below and saw that he has an appointment scheduled, ended phone call. Lawrence [name], Tomislav Micale D, [place]

## 2015-09-19 NOTE — Telephone Encounter (Signed)
Please advise him to come in.  Thanks!

## 2015-09-22 DIAGNOSIS — N184 Chronic kidney disease, stage 4 (severe): Secondary | ICD-10-CM | POA: Diagnosis not present

## 2015-09-22 DIAGNOSIS — I5032 Chronic diastolic (congestive) heart failure: Secondary | ICD-10-CM | POA: Diagnosis not present

## 2015-09-22 DIAGNOSIS — E785 Hyperlipidemia, unspecified: Secondary | ICD-10-CM | POA: Diagnosis not present

## 2015-09-22 DIAGNOSIS — I130 Hypertensive heart and chronic kidney disease with heart failure and stage 1 through stage 4 chronic kidney disease, or unspecified chronic kidney disease: Secondary | ICD-10-CM | POA: Diagnosis not present

## 2015-09-22 DIAGNOSIS — L89322 Pressure ulcer of left buttock, stage 2: Secondary | ICD-10-CM | POA: Diagnosis not present

## 2015-09-22 DIAGNOSIS — R339 Retention of urine, unspecified: Secondary | ICD-10-CM | POA: Diagnosis not present

## 2015-09-22 DIAGNOSIS — Z435 Encounter for attention to cystostomy: Secondary | ICD-10-CM | POA: Diagnosis not present

## 2015-09-22 DIAGNOSIS — E1140 Type 2 diabetes mellitus with diabetic neuropathy, unspecified: Secondary | ICD-10-CM | POA: Diagnosis not present

## 2015-09-22 DIAGNOSIS — M5010 Cervical disc disorder with radiculopathy, unspecified cervical region: Secondary | ICD-10-CM | POA: Diagnosis not present

## 2015-09-22 DIAGNOSIS — E1122 Type 2 diabetes mellitus with diabetic chronic kidney disease: Secondary | ICD-10-CM | POA: Diagnosis not present

## 2015-09-22 DIAGNOSIS — E1151 Type 2 diabetes mellitus with diabetic peripheral angiopathy without gangrene: Secondary | ICD-10-CM | POA: Diagnosis not present

## 2015-09-22 DIAGNOSIS — Z7982 Long term (current) use of aspirin: Secondary | ICD-10-CM | POA: Diagnosis not present

## 2015-09-23 DIAGNOSIS — E1140 Type 2 diabetes mellitus with diabetic neuropathy, unspecified: Secondary | ICD-10-CM | POA: Diagnosis not present

## 2015-09-23 DIAGNOSIS — E1151 Type 2 diabetes mellitus with diabetic peripheral angiopathy without gangrene: Secondary | ICD-10-CM | POA: Diagnosis not present

## 2015-09-23 DIAGNOSIS — I130 Hypertensive heart and chronic kidney disease with heart failure and stage 1 through stage 4 chronic kidney disease, or unspecified chronic kidney disease: Secondary | ICD-10-CM | POA: Diagnosis not present

## 2015-09-23 DIAGNOSIS — E1122 Type 2 diabetes mellitus with diabetic chronic kidney disease: Secondary | ICD-10-CM | POA: Diagnosis not present

## 2015-09-23 DIAGNOSIS — N184 Chronic kidney disease, stage 4 (severe): Secondary | ICD-10-CM | POA: Diagnosis not present

## 2015-09-23 DIAGNOSIS — Z435 Encounter for attention to cystostomy: Secondary | ICD-10-CM | POA: Diagnosis not present

## 2015-09-23 DIAGNOSIS — Z7982 Long term (current) use of aspirin: Secondary | ICD-10-CM | POA: Diagnosis not present

## 2015-09-23 DIAGNOSIS — M5010 Cervical disc disorder with radiculopathy, unspecified cervical region: Secondary | ICD-10-CM | POA: Diagnosis not present

## 2015-09-23 DIAGNOSIS — E785 Hyperlipidemia, unspecified: Secondary | ICD-10-CM | POA: Diagnosis not present

## 2015-09-23 DIAGNOSIS — L89322 Pressure ulcer of left buttock, stage 2: Secondary | ICD-10-CM | POA: Diagnosis not present

## 2015-09-23 DIAGNOSIS — I5032 Chronic diastolic (congestive) heart failure: Secondary | ICD-10-CM | POA: Diagnosis not present

## 2015-09-23 DIAGNOSIS — R339 Retention of urine, unspecified: Secondary | ICD-10-CM | POA: Diagnosis not present

## 2015-09-24 DIAGNOSIS — M5010 Cervical disc disorder with radiculopathy, unspecified cervical region: Secondary | ICD-10-CM | POA: Diagnosis not present

## 2015-09-24 DIAGNOSIS — Z7982 Long term (current) use of aspirin: Secondary | ICD-10-CM | POA: Diagnosis not present

## 2015-09-24 DIAGNOSIS — L89322 Pressure ulcer of left buttock, stage 2: Secondary | ICD-10-CM | POA: Diagnosis not present

## 2015-09-24 DIAGNOSIS — E1122 Type 2 diabetes mellitus with diabetic chronic kidney disease: Secondary | ICD-10-CM | POA: Diagnosis not present

## 2015-09-24 DIAGNOSIS — E113599 Type 2 diabetes mellitus with proliferative diabetic retinopathy without macular edema, unspecified eye: Secondary | ICD-10-CM | POA: Diagnosis not present

## 2015-09-24 DIAGNOSIS — N184 Chronic kidney disease, stage 4 (severe): Secondary | ICD-10-CM | POA: Diagnosis not present

## 2015-09-24 DIAGNOSIS — I130 Hypertensive heart and chronic kidney disease with heart failure and stage 1 through stage 4 chronic kidney disease, or unspecified chronic kidney disease: Secondary | ICD-10-CM | POA: Diagnosis not present

## 2015-09-24 DIAGNOSIS — I5032 Chronic diastolic (congestive) heart failure: Secondary | ICD-10-CM | POA: Diagnosis not present

## 2015-09-24 DIAGNOSIS — E113551 Type 2 diabetes mellitus with stable proliferative diabetic retinopathy, right eye: Secondary | ICD-10-CM | POA: Diagnosis not present

## 2015-09-24 DIAGNOSIS — E785 Hyperlipidemia, unspecified: Secondary | ICD-10-CM | POA: Diagnosis not present

## 2015-09-24 DIAGNOSIS — R339 Retention of urine, unspecified: Secondary | ICD-10-CM | POA: Diagnosis not present

## 2015-09-24 DIAGNOSIS — E1140 Type 2 diabetes mellitus with diabetic neuropathy, unspecified: Secondary | ICD-10-CM | POA: Diagnosis not present

## 2015-09-24 DIAGNOSIS — E1151 Type 2 diabetes mellitus with diabetic peripheral angiopathy without gangrene: Secondary | ICD-10-CM | POA: Diagnosis not present

## 2015-09-24 DIAGNOSIS — Z435 Encounter for attention to cystostomy: Secondary | ICD-10-CM | POA: Diagnosis not present

## 2015-09-24 DIAGNOSIS — E1139 Type 2 diabetes mellitus with other diabetic ophthalmic complication: Secondary | ICD-10-CM | POA: Diagnosis not present

## 2015-09-24 DIAGNOSIS — H04123 Dry eye syndrome of bilateral lacrimal glands: Secondary | ICD-10-CM | POA: Diagnosis not present

## 2015-09-24 LAB — HM DIABETES EYE EXAM

## 2015-09-25 DIAGNOSIS — R339 Retention of urine, unspecified: Secondary | ICD-10-CM | POA: Diagnosis not present

## 2015-09-25 DIAGNOSIS — E1140 Type 2 diabetes mellitus with diabetic neuropathy, unspecified: Secondary | ICD-10-CM | POA: Diagnosis not present

## 2015-09-25 DIAGNOSIS — Z7982 Long term (current) use of aspirin: Secondary | ICD-10-CM | POA: Diagnosis not present

## 2015-09-25 DIAGNOSIS — L89322 Pressure ulcer of left buttock, stage 2: Secondary | ICD-10-CM | POA: Diagnosis not present

## 2015-09-25 DIAGNOSIS — M5010 Cervical disc disorder with radiculopathy, unspecified cervical region: Secondary | ICD-10-CM | POA: Diagnosis not present

## 2015-09-25 DIAGNOSIS — I130 Hypertensive heart and chronic kidney disease with heart failure and stage 1 through stage 4 chronic kidney disease, or unspecified chronic kidney disease: Secondary | ICD-10-CM | POA: Diagnosis not present

## 2015-09-25 DIAGNOSIS — E785 Hyperlipidemia, unspecified: Secondary | ICD-10-CM | POA: Diagnosis not present

## 2015-09-25 DIAGNOSIS — E1122 Type 2 diabetes mellitus with diabetic chronic kidney disease: Secondary | ICD-10-CM | POA: Diagnosis not present

## 2015-09-25 DIAGNOSIS — I5032 Chronic diastolic (congestive) heart failure: Secondary | ICD-10-CM | POA: Diagnosis not present

## 2015-09-25 DIAGNOSIS — Z435 Encounter for attention to cystostomy: Secondary | ICD-10-CM | POA: Diagnosis not present

## 2015-09-25 DIAGNOSIS — E1151 Type 2 diabetes mellitus with diabetic peripheral angiopathy without gangrene: Secondary | ICD-10-CM | POA: Diagnosis not present

## 2015-09-25 DIAGNOSIS — N184 Chronic kidney disease, stage 4 (severe): Secondary | ICD-10-CM | POA: Diagnosis not present

## 2015-09-26 ENCOUNTER — Ambulatory Visit (INDEPENDENT_AMBULATORY_CARE_PROVIDER_SITE_OTHER): Payer: Medicare Other | Admitting: Student

## 2015-09-26 ENCOUNTER — Encounter: Payer: Self-pay | Admitting: Student

## 2015-09-26 VITALS — BP 157/52 | HR 54 | Temp 98.2°F

## 2015-09-26 DIAGNOSIS — IMO0002 Reserved for concepts with insufficient information to code with codable children: Secondary | ICD-10-CM

## 2015-09-26 DIAGNOSIS — E7800 Pure hypercholesterolemia, unspecified: Secondary | ICD-10-CM

## 2015-09-26 DIAGNOSIS — E1140 Type 2 diabetes mellitus with diabetic neuropathy, unspecified: Secondary | ICD-10-CM

## 2015-09-26 DIAGNOSIS — E1165 Type 2 diabetes mellitus with hyperglycemia: Secondary | ICD-10-CM

## 2015-09-26 DIAGNOSIS — Z794 Long term (current) use of insulin: Secondary | ICD-10-CM

## 2015-09-26 DIAGNOSIS — I10 Essential (primary) hypertension: Secondary | ICD-10-CM | POA: Diagnosis not present

## 2015-09-26 LAB — POCT GLYCOSYLATED HEMOGLOBIN (HGB A1C): HEMOGLOBIN A1C: 6

## 2015-09-26 NOTE — Progress Notes (Signed)
   Subjective:    Patient ID: Lawrence [name] is a 55 y.o. old male.  CC:   HPI #Hypertension: reports intermittent situational high blood pressure. Denies shortness of breath or chest pain  #Diabetes: on Lantus 16 units daily. Doesn't check his blood sugar at home. Denies symptoms of hypo and hyperglycemia lately.   #CKD-4: followed by Camille Balcynthia Dunham (nephrologist). He has an upcoming appointment 10/15/2015  PMH: reviewed SH: denies smoking. Reports walking about this 100 feet with the use of a walker.   Review of Systems Per HPI Objective:   Vitals:   09/26/15 1419  BP: (!) 157/52  Pulse: (!) 54  Temp: 98.2 F (36.8 C)  TempSrc: Oral    GEN: appears, no apparent distress. HEENT:   Head: normocephalic and atraumatic,   Eyes: without conjunctival injection, sclera anicteric. Yellowish pink papule over his left upper eyelid   Ears: normal TM and ear canal,   Nares: no rhinorrhea, congestion or erythema,   Oropharynx: mmm without erythema or exudation. Positive for dental caries CVS: RRR, normal s1 and s2, no murmurs, no edema, no carotid bruits bilaterally RESP: no increased work of breathing, good air movement bilaterally, no crackles or wheeze GI: soft, non-tender,non-distended, +BS MSK: BKA on the right, muscular atrophy in the left.  SKIN: Negative for skin lesion HEM: No cervical lymphadenopathy NEURO: alert and oriented appropriately, no gross defecits. Seven point monofilament exam within normal limits in left foot PSYCH: appropriate mood and affect     Assessment & Plan:  Essential hypertension Blood pressure 157/52. He says his blood pressure is labile and situational. Reports good compliance with medication.  -No change to his medication today -Follow up in 6 months   Diabetes (HCC) A1c gone up to 6.0 from 5.7 about 3 months ago. Reports good compliance with his Lantus 50 units daily. He doesn't check his blood sugar. Denies symptoms of hypo-or  hyperglycemia. A 7 point monofilament exam within normal limit in left foot. He is BKA on right. He states that he has an eye exam 3 days ago with Dr. Luciana [facility]in [facility]. He will see his nephrologist on 10/15/2015 -We'll continue his Lantus at 15 units -[place] -Follow up in 6 months  HYPERCHOLESTEROLEMIA Lipid panel today.  Continue atorvastatin 40 mg daily

## 2015-09-26 NOTE — Assessment & Plan Note (Signed)
Lipid panel today °- Continue atorvastatin 40 mg daily °

## 2015-09-26 NOTE — Assessment & Plan Note (Addendum)
A1c gone up to 6.0 from 5.7 about 3 months ago. Reports good compliance with his Lantus 50 units daily. He doesn't check his blood sugar. Denies symptoms of hypo-or hyperglycemia. A 7 point monofilament exam within normal limit in left foot. He is BKA on right. He states that he has an eye exam 3 days ago with Dr. Luciana [facility]xeankin [facility]. He will see his nephrologist on 10/15/2015 -We'll continue his Lantus at 15 units -[place] dent[place] -Follow up in 6 months

## 2015-09-26 NOTE — Assessment & Plan Note (Signed)
Blood pressure 157/52. He says his blood pressure is labile and situational. Reports good compliance with medication.  -No change to his medication today -Follow up in 6 months

## 2015-09-26 NOTE — Patient Instructions (Addendum)
It was great seeing you today! We have addressed the following issues today  1. Blood pressure: Your blood pressure is 157/52. Your goal blood pressure is just laser 140/90. I recommend you continue taking your medication as it is and see Lawrence [name] back in 6 months.  2. Diabetes: Continue Lantus as it is. I would appreciate if you can contact the eye doctor to send Lawrence [name] the results of the eye exam. I will order a referral to a dentist.  3. Cholesterol: I will check your cholesterol level today   If we did any lab work today, and the results require attention, either me or my nurse will get in touch with you. If everything is normal, you will get a letter in mail. If you don't hear from Lawrence [name] in two weeks, please give Lawrence [name] a call. Otherwise, I look forward to talking with you again at our next visit. If you have any questions or concerns before then, please call the clinic at [phone].  Please bring all your medications to every doctors visit   Sign up for My Chart to have easy access to your labs results, and communication with your Primary care physician.    Please check-out at the front desk before leaving the clinic.   Take Care,

## 2015-09-27 LAB — LIPID PANEL
CHOLESTEROL: 142 mg/dL (ref 125–200)
HDL: 33 mg/dL — ABNORMAL LOW (ref >=40)
LDL Cholesterol: 89 mg/dL (ref <130)
TRIGLYCERIDES: 100 mg/dL (ref <150)
Total CHOL/HDL Ratio: 4.3 Ratio (ref <=5.0)
VLDL: 20 mg/dL (ref <30)

## 2015-09-29 ENCOUNTER — Telehealth: Payer: Self-pay | Admitting: Student

## 2015-09-29 ENCOUNTER — Encounter: Payer: Self-pay | Admitting: Student

## 2015-09-29 DIAGNOSIS — E1122 Type 2 diabetes mellitus with diabetic chronic kidney disease: Secondary | ICD-10-CM | POA: Diagnosis not present

## 2015-09-29 DIAGNOSIS — Z435 Encounter for attention to cystostomy: Secondary | ICD-10-CM | POA: Diagnosis not present

## 2015-09-29 DIAGNOSIS — I130 Hypertensive heart and chronic kidney disease with heart failure and stage 1 through stage 4 chronic kidney disease, or unspecified chronic kidney disease: Secondary | ICD-10-CM | POA: Diagnosis not present

## 2015-09-29 DIAGNOSIS — M5010 Cervical disc disorder with radiculopathy, unspecified cervical region: Secondary | ICD-10-CM | POA: Diagnosis not present

## 2015-09-29 DIAGNOSIS — I5032 Chronic diastolic (congestive) heart failure: Secondary | ICD-10-CM | POA: Diagnosis not present

## 2015-09-29 DIAGNOSIS — E1151 Type 2 diabetes mellitus with diabetic peripheral angiopathy without gangrene: Secondary | ICD-10-CM | POA: Diagnosis not present

## 2015-09-29 DIAGNOSIS — M24541 Contracture, right hand: Secondary | ICD-10-CM | POA: Diagnosis not present

## 2015-09-29 DIAGNOSIS — M24531 Contracture, right wrist: Secondary | ICD-10-CM | POA: Diagnosis not present

## 2015-09-29 DIAGNOSIS — E1140 Type 2 diabetes mellitus with diabetic neuropathy, unspecified: Secondary | ICD-10-CM | POA: Diagnosis not present

## 2015-09-29 DIAGNOSIS — R339 Retention of urine, unspecified: Secondary | ICD-10-CM | POA: Diagnosis not present

## 2015-09-29 DIAGNOSIS — L89322 Pressure ulcer of left buttock, stage 2: Secondary | ICD-10-CM | POA: Diagnosis not present

## 2015-09-29 DIAGNOSIS — Z7982 Long term (current) use of aspirin: Secondary | ICD-10-CM | POA: Diagnosis not present

## 2015-09-29 DIAGNOSIS — N184 Chronic kidney disease, stage 4 (severe): Secondary | ICD-10-CM | POA: Diagnosis not present

## 2015-09-29 DIAGNOSIS — E785 Hyperlipidemia, unspecified: Secondary | ICD-10-CM | POA: Diagnosis not present

## 2015-09-29 NOTE — Progress Notes (Signed)
Sent letter to patient about his A1c and Cholesterol. No changes to his medication at this time.

## 2015-09-29 NOTE — Telephone Encounter (Signed)
Lawrence [name] with [facility]:  He is still having bowel issues.  He never received a referral to GI dr.  At her visits, his BP is always high.  He hasnt eaten in 3 days because of the diarrhea on Saturday. He is more confused today.  Lawrence [name] thinks he is dehydrated. He is not taking his blood sugars Please advise.

## 2015-09-29 NOTE — Telephone Encounter (Signed)
Sheria LangCameron is calling back to let the doctor know that the patient is very depressed and she would like to know if there is anything she can do. jw

## 2015-09-29 NOTE — Telephone Encounter (Signed)
Left Sheria LangCameron a voice message that PCP would like for patient to be seen in clinic.  Also left voice message for patient to call clinic.  Clovis PuMartin, Manasi Dishon L, RN

## 2015-09-30 ENCOUNTER — Telehealth: Payer: Self-pay | Admitting: Student

## 2015-09-30 DIAGNOSIS — R310 Gross hematuria: Secondary | ICD-10-CM | POA: Diagnosis not present

## 2015-09-30 DIAGNOSIS — N184 Chronic kidney disease, stage 4 (severe): Secondary | ICD-10-CM | POA: Diagnosis not present

## 2015-09-30 NOTE — Telephone Encounter (Signed)
Called and talked to patient in regards to a recent concern about his depression, diarrhea and dehydration by his nurse. Patient stated that he is doing fine. He says they were just exaggerating.

## 2015-10-01 DIAGNOSIS — Z435 Encounter for attention to cystostomy: Secondary | ICD-10-CM | POA: Diagnosis not present

## 2015-10-01 DIAGNOSIS — R339 Retention of urine, unspecified: Secondary | ICD-10-CM | POA: Diagnosis not present

## 2015-10-01 DIAGNOSIS — E785 Hyperlipidemia, unspecified: Secondary | ICD-10-CM | POA: Diagnosis not present

## 2015-10-01 DIAGNOSIS — M5010 Cervical disc disorder with radiculopathy, unspecified cervical region: Secondary | ICD-10-CM | POA: Diagnosis not present

## 2015-10-01 DIAGNOSIS — I5032 Chronic diastolic (congestive) heart failure: Secondary | ICD-10-CM | POA: Diagnosis not present

## 2015-10-01 DIAGNOSIS — N184 Chronic kidney disease, stage 4 (severe): Secondary | ICD-10-CM | POA: Diagnosis not present

## 2015-10-01 DIAGNOSIS — E1140 Type 2 diabetes mellitus with diabetic neuropathy, unspecified: Secondary | ICD-10-CM | POA: Diagnosis not present

## 2015-10-01 DIAGNOSIS — I130 Hypertensive heart and chronic kidney disease with heart failure and stage 1 through stage 4 chronic kidney disease, or unspecified chronic kidney disease: Secondary | ICD-10-CM | POA: Diagnosis not present

## 2015-10-01 DIAGNOSIS — L89322 Pressure ulcer of left buttock, stage 2: Secondary | ICD-10-CM | POA: Diagnosis not present

## 2015-10-01 DIAGNOSIS — E1122 Type 2 diabetes mellitus with diabetic chronic kidney disease: Secondary | ICD-10-CM | POA: Diagnosis not present

## 2015-10-01 DIAGNOSIS — Z7982 Long term (current) use of aspirin: Secondary | ICD-10-CM | POA: Diagnosis not present

## 2015-10-01 DIAGNOSIS — E1151 Type 2 diabetes mellitus with diabetic peripheral angiopathy without gangrene: Secondary | ICD-10-CM | POA: Diagnosis not present

## 2015-10-02 DIAGNOSIS — Z435 Encounter for attention to cystostomy: Secondary | ICD-10-CM | POA: Diagnosis not present

## 2015-10-02 DIAGNOSIS — E1151 Type 2 diabetes mellitus with diabetic peripheral angiopathy without gangrene: Secondary | ICD-10-CM | POA: Diagnosis not present

## 2015-10-02 DIAGNOSIS — I5032 Chronic diastolic (congestive) heart failure: Secondary | ICD-10-CM | POA: Diagnosis not present

## 2015-10-02 DIAGNOSIS — M5010 Cervical disc disorder with radiculopathy, unspecified cervical region: Secondary | ICD-10-CM | POA: Diagnosis not present

## 2015-10-02 DIAGNOSIS — R339 Retention of urine, unspecified: Secondary | ICD-10-CM | POA: Diagnosis not present

## 2015-10-02 DIAGNOSIS — I130 Hypertensive heart and chronic kidney disease with heart failure and stage 1 through stage 4 chronic kidney disease, or unspecified chronic kidney disease: Secondary | ICD-10-CM | POA: Diagnosis not present

## 2015-10-02 DIAGNOSIS — E785 Hyperlipidemia, unspecified: Secondary | ICD-10-CM | POA: Diagnosis not present

## 2015-10-02 DIAGNOSIS — N184 Chronic kidney disease, stage 4 (severe): Secondary | ICD-10-CM | POA: Diagnosis not present

## 2015-10-02 DIAGNOSIS — Z7982 Long term (current) use of aspirin: Secondary | ICD-10-CM | POA: Diagnosis not present

## 2015-10-02 DIAGNOSIS — E1140 Type 2 diabetes mellitus with diabetic neuropathy, unspecified: Secondary | ICD-10-CM | POA: Diagnosis not present

## 2015-10-02 DIAGNOSIS — L89322 Pressure ulcer of left buttock, stage 2: Secondary | ICD-10-CM | POA: Diagnosis not present

## 2015-10-02 DIAGNOSIS — E1122 Type 2 diabetes mellitus with diabetic chronic kidney disease: Secondary | ICD-10-CM | POA: Diagnosis not present

## 2015-10-06 ENCOUNTER — Encounter: Payer: Self-pay | Admitting: Student

## 2015-10-06 DIAGNOSIS — N312 Flaccid neuropathic bladder, not elsewhere classified: Secondary | ICD-10-CM | POA: Insufficient documentation

## 2015-10-06 DIAGNOSIS — R310 Gross hematuria: Secondary | ICD-10-CM | POA: Insufficient documentation

## 2015-10-07 ENCOUNTER — Telehealth: Payer: Self-pay | Admitting: *Deleted

## 2015-10-07 ENCOUNTER — Telehealth: Payer: Self-pay | Admitting: Student

## 2015-10-07 DIAGNOSIS — E785 Hyperlipidemia, unspecified: Secondary | ICD-10-CM | POA: Diagnosis not present

## 2015-10-07 DIAGNOSIS — E1122 Type 2 diabetes mellitus with diabetic chronic kidney disease: Secondary | ICD-10-CM | POA: Diagnosis not present

## 2015-10-07 DIAGNOSIS — E1151 Type 2 diabetes mellitus with diabetic peripheral angiopathy without gangrene: Secondary | ICD-10-CM | POA: Diagnosis not present

## 2015-10-07 DIAGNOSIS — N184 Chronic kidney disease, stage 4 (severe): Secondary | ICD-10-CM | POA: Diagnosis not present

## 2015-10-07 DIAGNOSIS — I130 Hypertensive heart and chronic kidney disease with heart failure and stage 1 through stage 4 chronic kidney disease, or unspecified chronic kidney disease: Secondary | ICD-10-CM | POA: Diagnosis not present

## 2015-10-07 DIAGNOSIS — M5010 Cervical disc disorder with radiculopathy, unspecified cervical region: Secondary | ICD-10-CM | POA: Diagnosis not present

## 2015-10-07 DIAGNOSIS — I5032 Chronic diastolic (congestive) heart failure: Secondary | ICD-10-CM | POA: Diagnosis not present

## 2015-10-07 DIAGNOSIS — R339 Retention of urine, unspecified: Secondary | ICD-10-CM | POA: Diagnosis not present

## 2015-10-07 DIAGNOSIS — Z435 Encounter for attention to cystostomy: Secondary | ICD-10-CM | POA: Diagnosis not present

## 2015-10-07 DIAGNOSIS — E1140 Type 2 diabetes mellitus with diabetic neuropathy, unspecified: Secondary | ICD-10-CM | POA: Diagnosis not present

## 2015-10-07 DIAGNOSIS — Z7982 Long term (current) use of aspirin: Secondary | ICD-10-CM | POA: Diagnosis not present

## 2015-10-07 DIAGNOSIS — L89322 Pressure ulcer of left buttock, stage 2: Secondary | ICD-10-CM | POA: Diagnosis not present

## 2015-10-07 NOTE — Telephone Encounter (Signed)
[facility] needing providing signature and date for arm wheelchair.  Please sign and [facility].  Clovis PuMartin, Lanice Folden L, RN

## 2015-10-07 NOTE — Telephone Encounter (Signed)
Tried calling patient to check if he wanted to schedule an appointment.  Unable to get through.  Will forward to PCP.  Clovis PuMartin, Pranavi Aure L, RN

## 2015-10-07 NOTE — Telephone Encounter (Signed)
Form signed by Dr. Leveda AnnaHensel.  [facility].  Clovis PuMartin, Tamika L, RN

## 2015-10-07 NOTE — Telephone Encounter (Signed)
[facility].  She reported that patient is also having some dizziness and blurred vision.  Advised her to give patient a call to schedule an appointment with a provider ASAP.  Will forward to PCP to be aware.  Clovis PuMartin, Tamika L, RN

## 2015-10-07 NOTE — Telephone Encounter (Signed)
pts bp are continuing to be elevated.  Today they were 202/99  And 187/93.  Sheria LangCameron is at the pts home now and wants to know what to do.  Please advise

## 2015-10-08 DIAGNOSIS — H44521 Atrophy of globe, right eye: Secondary | ICD-10-CM | POA: Diagnosis not present

## 2015-10-08 DIAGNOSIS — E785 Hyperlipidemia, unspecified: Secondary | ICD-10-CM | POA: Diagnosis not present

## 2015-10-08 DIAGNOSIS — E1140 Type 2 diabetes mellitus with diabetic neuropathy, unspecified: Secondary | ICD-10-CM | POA: Diagnosis not present

## 2015-10-08 DIAGNOSIS — M5010 Cervical disc disorder with radiculopathy, unspecified cervical region: Secondary | ICD-10-CM | POA: Diagnosis not present

## 2015-10-08 DIAGNOSIS — I130 Hypertensive heart and chronic kidney disease with heart failure and stage 1 through stage 4 chronic kidney disease, or unspecified chronic kidney disease: Secondary | ICD-10-CM | POA: Diagnosis not present

## 2015-10-08 DIAGNOSIS — E1122 Type 2 diabetes mellitus with diabetic chronic kidney disease: Secondary | ICD-10-CM | POA: Diagnosis not present

## 2015-10-08 DIAGNOSIS — H04123 Dry eye syndrome of bilateral lacrimal glands: Secondary | ICD-10-CM | POA: Diagnosis not present

## 2015-10-08 DIAGNOSIS — H4060X2 Glaucoma secondary to drugs, unspecified eye, moderate stage: Secondary | ICD-10-CM | POA: Diagnosis not present

## 2015-10-08 DIAGNOSIS — I5032 Chronic diastolic (congestive) heart failure: Secondary | ICD-10-CM | POA: Diagnosis not present

## 2015-10-08 DIAGNOSIS — L89322 Pressure ulcer of left buttock, stage 2: Secondary | ICD-10-CM | POA: Diagnosis not present

## 2015-10-08 DIAGNOSIS — R339 Retention of urine, unspecified: Secondary | ICD-10-CM | POA: Diagnosis not present

## 2015-10-08 DIAGNOSIS — Z7982 Long term (current) use of aspirin: Secondary | ICD-10-CM | POA: Diagnosis not present

## 2015-10-08 DIAGNOSIS — N184 Chronic kidney disease, stage 4 (severe): Secondary | ICD-10-CM | POA: Diagnosis not present

## 2015-10-08 DIAGNOSIS — Z435 Encounter for attention to cystostomy: Secondary | ICD-10-CM | POA: Diagnosis not present

## 2015-10-08 DIAGNOSIS — E1151 Type 2 diabetes mellitus with diabetic peripheral angiopathy without gangrene: Secondary | ICD-10-CM | POA: Diagnosis not present

## 2015-10-08 DIAGNOSIS — E113551 Type 2 diabetes mellitus with stable proliferative diabetic retinopathy, right eye: Secondary | ICD-10-CM | POA: Diagnosis not present

## 2015-10-08 NOTE — Telephone Encounter (Signed)
Lawrence [name] with [facility] states that PT and OT will be out to see patient today and tomorrow.  She is wondering if they can continue to see patient and what parameters they need to be following in regards to his BP.  What ranges they need to be looking for before treatment needs to be done. Sangita Zani,CMA

## 2015-10-09 DIAGNOSIS — E1140 Type 2 diabetes mellitus with diabetic neuropathy, unspecified: Secondary | ICD-10-CM | POA: Diagnosis not present

## 2015-10-09 DIAGNOSIS — M5010 Cervical disc disorder with radiculopathy, unspecified cervical region: Secondary | ICD-10-CM | POA: Diagnosis not present

## 2015-10-09 DIAGNOSIS — L89322 Pressure ulcer of left buttock, stage 2: Secondary | ICD-10-CM | POA: Diagnosis not present

## 2015-10-09 DIAGNOSIS — E1122 Type 2 diabetes mellitus with diabetic chronic kidney disease: Secondary | ICD-10-CM | POA: Diagnosis not present

## 2015-10-09 DIAGNOSIS — E1151 Type 2 diabetes mellitus with diabetic peripheral angiopathy without gangrene: Secondary | ICD-10-CM | POA: Diagnosis not present

## 2015-10-09 DIAGNOSIS — N184 Chronic kidney disease, stage 4 (severe): Secondary | ICD-10-CM | POA: Diagnosis not present

## 2015-10-09 DIAGNOSIS — Z435 Encounter for attention to cystostomy: Secondary | ICD-10-CM | POA: Diagnosis not present

## 2015-10-09 DIAGNOSIS — M869 Osteomyelitis, unspecified: Secondary | ICD-10-CM | POA: Diagnosis not present

## 2015-10-09 DIAGNOSIS — E785 Hyperlipidemia, unspecified: Secondary | ICD-10-CM | POA: Diagnosis not present

## 2015-10-09 DIAGNOSIS — R2681 Unsteadiness on feet: Secondary | ICD-10-CM | POA: Diagnosis not present

## 2015-10-09 DIAGNOSIS — Z89511 Acquired absence of right leg below knee: Secondary | ICD-10-CM | POA: Diagnosis not present

## 2015-10-09 DIAGNOSIS — I5032 Chronic diastolic (congestive) heart failure: Secondary | ICD-10-CM | POA: Diagnosis not present

## 2015-10-09 DIAGNOSIS — R339 Retention of urine, unspecified: Secondary | ICD-10-CM | POA: Diagnosis not present

## 2015-10-09 DIAGNOSIS — I130 Hypertensive heart and chronic kidney disease with heart failure and stage 1 through stage 4 chronic kidney disease, or unspecified chronic kidney disease: Secondary | ICD-10-CM | POA: Diagnosis not present

## 2015-10-09 DIAGNOSIS — Z7982 Long term (current) use of aspirin: Secondary | ICD-10-CM | POA: Diagnosis not present

## 2015-10-10 ENCOUNTER — Telehealth: Payer: Self-pay | Admitting: Student

## 2015-10-10 DIAGNOSIS — E1140 Type 2 diabetes mellitus with diabetic neuropathy, unspecified: Secondary | ICD-10-CM | POA: Diagnosis not present

## 2015-10-10 DIAGNOSIS — R2689 Other abnormalities of gait and mobility: Secondary | ICD-10-CM | POA: Diagnosis not present

## 2015-10-10 NOTE — Telephone Encounter (Signed)
LVM for Lawrence [name] to call back to give her the information below from Dr. Alanda SlimGonfa about pt BP.  Lamonte SakaiZimmerman Rumple, Marriana Hibberd D, [place]

## 2015-10-10 NOTE — Telephone Encounter (Signed)
Sheria LangCameron called back and I gave her the message that Dr. Alanda SlimGonfa left, she stated that there parameters for her office are 160/110, she stated that if the BP is in that range that they are to contact the PCP or send pt to ED.  She stated that at the PT visit on the 27th that the BP reading was 170/90, she would like to know what the plan is going forward with the pt, also she said she saw that the pt had a tele-health unit that hasn't been set up and stated that if the PCP wanted to have this set up he could contact them and they could see about getting a nurse out to set that up over the weekend. Sheria LangCameron stated that you could contact her on her phone or contact the office. She stated that she is not due to go back until Tuesday and the PT will be out there on Monday. Please contact them and let them know what you would like to do. Lamonte SakaiZimmerman Rumple, April D, [place]

## 2015-10-10 NOTE — Telephone Encounter (Signed)
Called and talked to patient about his blood pressure in response to the message I received from [facility] from [employer]. Patient states "my blood pressure is high whenever the OT person checks it because she is so beautiful". However, he has mildly elevated SBP in 150's in clinic as well. He is on Metoprolol 25 mg twice a day and lasix 40 mg twice a day. It is reasonable to add amlodipine to his regimen for isolated SBP but patient states his blood pressure was 120/80 yesterday. So, I will advise someone else check his blood pressure. If blood pressure is consistently more than 140/7990mmHg we will consider adding medication.

## 2015-10-10 NOTE — Telephone Encounter (Signed)
Called and talked to patient about his blood pressure in response to the message I received from [facility] [employer]. Patient states "my blood pressure is high whenever the OT person checks it because she is so beautiful". However, he has mildly elevated SBP in 150's in clinic as well. He is on Metoprolol 25 mg twice a day and lasix 40 mg twice a day. It is reasonable to add amlodipine to his regimen for isolated SBP but patient states his blood pressure was 120/80 yesterday. So, I advise someone else check his blood pressure. If blood pressure is consistently more than 140/3590mmHg we will consider adding medication.

## 2015-10-13 DIAGNOSIS — N184 Chronic kidney disease, stage 4 (severe): Secondary | ICD-10-CM | POA: Diagnosis not present

## 2015-10-13 DIAGNOSIS — I5032 Chronic diastolic (congestive) heart failure: Secondary | ICD-10-CM | POA: Diagnosis not present

## 2015-10-13 DIAGNOSIS — Z7982 Long term (current) use of aspirin: Secondary | ICD-10-CM | POA: Diagnosis not present

## 2015-10-13 DIAGNOSIS — E1151 Type 2 diabetes mellitus with diabetic peripheral angiopathy without gangrene: Secondary | ICD-10-CM | POA: Diagnosis not present

## 2015-10-13 DIAGNOSIS — E1122 Type 2 diabetes mellitus with diabetic chronic kidney disease: Secondary | ICD-10-CM | POA: Diagnosis not present

## 2015-10-13 DIAGNOSIS — Z435 Encounter for attention to cystostomy: Secondary | ICD-10-CM | POA: Diagnosis not present

## 2015-10-13 DIAGNOSIS — I130 Hypertensive heart and chronic kidney disease with heart failure and stage 1 through stage 4 chronic kidney disease, or unspecified chronic kidney disease: Secondary | ICD-10-CM | POA: Diagnosis not present

## 2015-10-13 DIAGNOSIS — E785 Hyperlipidemia, unspecified: Secondary | ICD-10-CM | POA: Diagnosis not present

## 2015-10-13 DIAGNOSIS — R339 Retention of urine, unspecified: Secondary | ICD-10-CM | POA: Diagnosis not present

## 2015-10-13 DIAGNOSIS — E1140 Type 2 diabetes mellitus with diabetic neuropathy, unspecified: Secondary | ICD-10-CM | POA: Diagnosis not present

## 2015-10-13 DIAGNOSIS — M5010 Cervical disc disorder with radiculopathy, unspecified cervical region: Secondary | ICD-10-CM | POA: Diagnosis not present

## 2015-10-13 DIAGNOSIS — L89322 Pressure ulcer of left buttock, stage 2: Secondary | ICD-10-CM | POA: Diagnosis not present

## 2015-10-14 DIAGNOSIS — R339 Retention of urine, unspecified: Secondary | ICD-10-CM | POA: Diagnosis not present

## 2015-10-14 DIAGNOSIS — I5032 Chronic diastolic (congestive) heart failure: Secondary | ICD-10-CM | POA: Diagnosis not present

## 2015-10-14 DIAGNOSIS — Z7982 Long term (current) use of aspirin: Secondary | ICD-10-CM | POA: Diagnosis not present

## 2015-10-14 DIAGNOSIS — L89322 Pressure ulcer of left buttock, stage 2: Secondary | ICD-10-CM | POA: Diagnosis not present

## 2015-10-14 DIAGNOSIS — E785 Hyperlipidemia, unspecified: Secondary | ICD-10-CM | POA: Diagnosis not present

## 2015-10-14 DIAGNOSIS — Z435 Encounter for attention to cystostomy: Secondary | ICD-10-CM | POA: Diagnosis not present

## 2015-10-14 DIAGNOSIS — M5010 Cervical disc disorder with radiculopathy, unspecified cervical region: Secondary | ICD-10-CM | POA: Diagnosis not present

## 2015-10-14 DIAGNOSIS — E1151 Type 2 diabetes mellitus with diabetic peripheral angiopathy without gangrene: Secondary | ICD-10-CM | POA: Diagnosis not present

## 2015-10-14 DIAGNOSIS — I130 Hypertensive heart and chronic kidney disease with heart failure and stage 1 through stage 4 chronic kidney disease, or unspecified chronic kidney disease: Secondary | ICD-10-CM | POA: Diagnosis not present

## 2015-10-14 DIAGNOSIS — E1122 Type 2 diabetes mellitus with diabetic chronic kidney disease: Secondary | ICD-10-CM | POA: Diagnosis not present

## 2015-10-14 DIAGNOSIS — E1140 Type 2 diabetes mellitus with diabetic neuropathy, unspecified: Secondary | ICD-10-CM | POA: Diagnosis not present

## 2015-10-14 DIAGNOSIS — N184 Chronic kidney disease, stage 4 (severe): Secondary | ICD-10-CM | POA: Diagnosis not present

## 2015-10-15 DIAGNOSIS — R339 Retention of urine, unspecified: Secondary | ICD-10-CM | POA: Diagnosis not present

## 2015-10-15 DIAGNOSIS — D631 Anemia in chronic kidney disease: Secondary | ICD-10-CM | POA: Diagnosis not present

## 2015-10-15 DIAGNOSIS — Z435 Encounter for attention to cystostomy: Secondary | ICD-10-CM | POA: Diagnosis not present

## 2015-10-15 DIAGNOSIS — E1140 Type 2 diabetes mellitus with diabetic neuropathy, unspecified: Secondary | ICD-10-CM | POA: Diagnosis not present

## 2015-10-15 DIAGNOSIS — E785 Hyperlipidemia, unspecified: Secondary | ICD-10-CM | POA: Diagnosis not present

## 2015-10-15 DIAGNOSIS — I5032 Chronic diastolic (congestive) heart failure: Secondary | ICD-10-CM | POA: Diagnosis not present

## 2015-10-15 DIAGNOSIS — N184 Chronic kidney disease, stage 4 (severe): Secondary | ICD-10-CM | POA: Diagnosis not present

## 2015-10-15 DIAGNOSIS — L89322 Pressure ulcer of left buttock, stage 2: Secondary | ICD-10-CM | POA: Diagnosis not present

## 2015-10-15 DIAGNOSIS — I130 Hypertensive heart and chronic kidney disease with heart failure and stage 1 through stage 4 chronic kidney disease, or unspecified chronic kidney disease: Secondary | ICD-10-CM | POA: Diagnosis not present

## 2015-10-15 DIAGNOSIS — N2581 Secondary hyperparathyroidism of renal origin: Secondary | ICD-10-CM | POA: Diagnosis not present

## 2015-10-15 DIAGNOSIS — N189 Chronic kidney disease, unspecified: Secondary | ICD-10-CM | POA: Diagnosis not present

## 2015-10-15 DIAGNOSIS — I129 Hypertensive chronic kidney disease with stage 1 through stage 4 chronic kidney disease, or unspecified chronic kidney disease: Secondary | ICD-10-CM | POA: Diagnosis not present

## 2015-10-15 DIAGNOSIS — M5010 Cervical disc disorder with radiculopathy, unspecified cervical region: Secondary | ICD-10-CM | POA: Diagnosis not present

## 2015-10-15 DIAGNOSIS — E1129 Type 2 diabetes mellitus with other diabetic kidney complication: Secondary | ICD-10-CM | POA: Diagnosis not present

## 2015-10-15 DIAGNOSIS — Z7982 Long term (current) use of aspirin: Secondary | ICD-10-CM | POA: Diagnosis not present

## 2015-10-15 DIAGNOSIS — E1122 Type 2 diabetes mellitus with diabetic chronic kidney disease: Secondary | ICD-10-CM | POA: Diagnosis not present

## 2015-10-15 DIAGNOSIS — E1151 Type 2 diabetes mellitus with diabetic peripheral angiopathy without gangrene: Secondary | ICD-10-CM | POA: Diagnosis not present

## 2015-10-15 NOTE — Telephone Encounter (Signed)
[facility]. She says the tele-health has already been set-up. They will forward the blood pressure readings to us. They will also call the office for BP over 160/110 as well.

## 2015-10-16 DIAGNOSIS — H44521 Atrophy of globe, right eye: Secondary | ICD-10-CM | POA: Diagnosis not present

## 2015-10-16 DIAGNOSIS — H04123 Dry eye syndrome of bilateral lacrimal glands: Secondary | ICD-10-CM | POA: Diagnosis not present

## 2015-10-16 DIAGNOSIS — E1151 Type 2 diabetes mellitus with diabetic peripheral angiopathy without gangrene: Secondary | ICD-10-CM | POA: Diagnosis not present

## 2015-10-16 DIAGNOSIS — Z7982 Long term (current) use of aspirin: Secondary | ICD-10-CM | POA: Diagnosis not present

## 2015-10-16 DIAGNOSIS — E785 Hyperlipidemia, unspecified: Secondary | ICD-10-CM | POA: Diagnosis not present

## 2015-10-16 DIAGNOSIS — E113551 Type 2 diabetes mellitus with stable proliferative diabetic retinopathy, right eye: Secondary | ICD-10-CM | POA: Diagnosis not present

## 2015-10-16 DIAGNOSIS — E1122 Type 2 diabetes mellitus with diabetic chronic kidney disease: Secondary | ICD-10-CM | POA: Diagnosis not present

## 2015-10-16 DIAGNOSIS — Z435 Encounter for attention to cystostomy: Secondary | ICD-10-CM | POA: Diagnosis not present

## 2015-10-16 DIAGNOSIS — M5010 Cervical disc disorder with radiculopathy, unspecified cervical region: Secondary | ICD-10-CM | POA: Diagnosis not present

## 2015-10-16 DIAGNOSIS — R339 Retention of urine, unspecified: Secondary | ICD-10-CM | POA: Diagnosis not present

## 2015-10-16 DIAGNOSIS — E1140 Type 2 diabetes mellitus with diabetic neuropathy, unspecified: Secondary | ICD-10-CM | POA: Diagnosis not present

## 2015-10-16 DIAGNOSIS — N184 Chronic kidney disease, stage 4 (severe): Secondary | ICD-10-CM | POA: Diagnosis not present

## 2015-10-16 DIAGNOSIS — I130 Hypertensive heart and chronic kidney disease with heart failure and stage 1 through stage 4 chronic kidney disease, or unspecified chronic kidney disease: Secondary | ICD-10-CM | POA: Diagnosis not present

## 2015-10-16 DIAGNOSIS — L89322 Pressure ulcer of left buttock, stage 2: Secondary | ICD-10-CM | POA: Diagnosis not present

## 2015-10-16 DIAGNOSIS — I5032 Chronic diastolic (congestive) heart failure: Secondary | ICD-10-CM | POA: Diagnosis not present

## 2015-10-21 DIAGNOSIS — Z435 Encounter for attention to cystostomy: Secondary | ICD-10-CM | POA: Diagnosis not present

## 2015-10-21 DIAGNOSIS — M5010 Cervical disc disorder with radiculopathy, unspecified cervical region: Secondary | ICD-10-CM | POA: Diagnosis not present

## 2015-10-21 DIAGNOSIS — L89322 Pressure ulcer of left buttock, stage 2: Secondary | ICD-10-CM | POA: Diagnosis not present

## 2015-10-21 DIAGNOSIS — Z7982 Long term (current) use of aspirin: Secondary | ICD-10-CM | POA: Diagnosis not present

## 2015-10-21 DIAGNOSIS — E1140 Type 2 diabetes mellitus with diabetic neuropathy, unspecified: Secondary | ICD-10-CM | POA: Diagnosis not present

## 2015-10-21 DIAGNOSIS — I130 Hypertensive heart and chronic kidney disease with heart failure and stage 1 through stage 4 chronic kidney disease, or unspecified chronic kidney disease: Secondary | ICD-10-CM | POA: Diagnosis not present

## 2015-10-21 DIAGNOSIS — E1122 Type 2 diabetes mellitus with diabetic chronic kidney disease: Secondary | ICD-10-CM | POA: Diagnosis not present

## 2015-10-21 DIAGNOSIS — N184 Chronic kidney disease, stage 4 (severe): Secondary | ICD-10-CM | POA: Diagnosis not present

## 2015-10-21 DIAGNOSIS — E1151 Type 2 diabetes mellitus with diabetic peripheral angiopathy without gangrene: Secondary | ICD-10-CM | POA: Diagnosis not present

## 2015-10-21 DIAGNOSIS — R339 Retention of urine, unspecified: Secondary | ICD-10-CM | POA: Diagnosis not present

## 2015-10-21 DIAGNOSIS — I5032 Chronic diastolic (congestive) heart failure: Secondary | ICD-10-CM | POA: Diagnosis not present

## 2015-10-21 DIAGNOSIS — E785 Hyperlipidemia, unspecified: Secondary | ICD-10-CM | POA: Diagnosis not present

## 2015-10-22 ENCOUNTER — Telehealth: Payer: Self-pay | Admitting: Student

## 2015-10-22 DIAGNOSIS — Z7982 Long term (current) use of aspirin: Secondary | ICD-10-CM | POA: Diagnosis not present

## 2015-10-22 DIAGNOSIS — E1140 Type 2 diabetes mellitus with diabetic neuropathy, unspecified: Secondary | ICD-10-CM | POA: Diagnosis not present

## 2015-10-22 DIAGNOSIS — L89322 Pressure ulcer of left buttock, stage 2: Secondary | ICD-10-CM | POA: Diagnosis not present

## 2015-10-22 DIAGNOSIS — M5010 Cervical disc disorder with radiculopathy, unspecified cervical region: Secondary | ICD-10-CM | POA: Diagnosis not present

## 2015-10-22 DIAGNOSIS — I5032 Chronic diastolic (congestive) heart failure: Secondary | ICD-10-CM | POA: Diagnosis not present

## 2015-10-22 DIAGNOSIS — E1151 Type 2 diabetes mellitus with diabetic peripheral angiopathy without gangrene: Secondary | ICD-10-CM | POA: Diagnosis not present

## 2015-10-22 DIAGNOSIS — E1122 Type 2 diabetes mellitus with diabetic chronic kidney disease: Secondary | ICD-10-CM | POA: Diagnosis not present

## 2015-10-22 DIAGNOSIS — R339 Retention of urine, unspecified: Secondary | ICD-10-CM | POA: Diagnosis not present

## 2015-10-22 DIAGNOSIS — I130 Hypertensive heart and chronic kidney disease with heart failure and stage 1 through stage 4 chronic kidney disease, or unspecified chronic kidney disease: Secondary | ICD-10-CM | POA: Diagnosis not present

## 2015-10-22 DIAGNOSIS — N184 Chronic kidney disease, stage 4 (severe): Secondary | ICD-10-CM | POA: Diagnosis not present

## 2015-10-22 DIAGNOSIS — Z435 Encounter for attention to cystostomy: Secondary | ICD-10-CM | POA: Diagnosis not present

## 2015-10-22 DIAGNOSIS — E785 Hyperlipidemia, unspecified: Secondary | ICD-10-CM | POA: Diagnosis not present

## 2015-10-22 NOTE — Telephone Encounter (Signed)
Sending to PCP as an [place]. Sunday SpillersSharon T Tenecia Ignasiak, CMA

## 2015-10-22 NOTE — Telephone Encounter (Signed)
Pt with [facility]:  Pt had a fall Sept 2. He reported no injures and no concerns. He has been seen since by PT.

## 2015-10-23 ENCOUNTER — Other Ambulatory Visit: Payer: Self-pay | Admitting: Student

## 2015-10-23 ENCOUNTER — Telehealth: Payer: Self-pay | Admitting: *Deleted

## 2015-10-23 ENCOUNTER — Encounter: Payer: Self-pay | Admitting: Student

## 2015-10-23 ENCOUNTER — Encounter: Payer: Self-pay | Admitting: Vascular Surgery

## 2015-10-23 DIAGNOSIS — N181 Chronic kidney disease, stage 1: Secondary | ICD-10-CM

## 2015-10-23 DIAGNOSIS — M5010 Cervical disc disorder with radiculopathy, unspecified cervical region: Secondary | ICD-10-CM | POA: Diagnosis not present

## 2015-10-23 DIAGNOSIS — I130 Hypertensive heart and chronic kidney disease with heart failure and stage 1 through stage 4 chronic kidney disease, or unspecified chronic kidney disease: Secondary | ICD-10-CM | POA: Diagnosis not present

## 2015-10-23 DIAGNOSIS — L89322 Pressure ulcer of left buttock, stage 2: Secondary | ICD-10-CM | POA: Diagnosis not present

## 2015-10-23 DIAGNOSIS — E1140 Type 2 diabetes mellitus with diabetic neuropathy, unspecified: Secondary | ICD-10-CM | POA: Diagnosis not present

## 2015-10-23 DIAGNOSIS — Z435 Encounter for attention to cystostomy: Secondary | ICD-10-CM | POA: Diagnosis not present

## 2015-10-23 DIAGNOSIS — D631 Anemia in chronic kidney disease: Secondary | ICD-10-CM

## 2015-10-23 DIAGNOSIS — E11319 Type 2 diabetes mellitus with unspecified diabetic retinopathy without macular edema: Secondary | ICD-10-CM | POA: Insufficient documentation

## 2015-10-23 DIAGNOSIS — E1122 Type 2 diabetes mellitus with diabetic chronic kidney disease: Secondary | ICD-10-CM | POA: Diagnosis not present

## 2015-10-23 DIAGNOSIS — R339 Retention of urine, unspecified: Secondary | ICD-10-CM | POA: Diagnosis not present

## 2015-10-23 DIAGNOSIS — I770 Arteriovenous fistula, acquired: Secondary | ICD-10-CM | POA: Insufficient documentation

## 2015-10-23 DIAGNOSIS — E785 Hyperlipidemia, unspecified: Secondary | ICD-10-CM | POA: Diagnosis not present

## 2015-10-23 DIAGNOSIS — I739 Peripheral vascular disease, unspecified: Secondary | ICD-10-CM | POA: Insufficient documentation

## 2015-10-23 DIAGNOSIS — E1151 Type 2 diabetes mellitus with diabetic peripheral angiopathy without gangrene: Secondary | ICD-10-CM | POA: Diagnosis not present

## 2015-10-23 DIAGNOSIS — Z7982 Long term (current) use of aspirin: Secondary | ICD-10-CM | POA: Diagnosis not present

## 2015-10-23 DIAGNOSIS — N184 Chronic kidney disease, stage 4 (severe): Secondary | ICD-10-CM | POA: Diagnosis not present

## 2015-10-23 DIAGNOSIS — N189 Chronic kidney disease, unspecified: Secondary | ICD-10-CM

## 2015-10-23 DIAGNOSIS — I10 Essential (primary) hypertension: Secondary | ICD-10-CM

## 2015-10-23 DIAGNOSIS — I5032 Chronic diastolic (congestive) heart failure: Secondary | ICD-10-CM | POA: Diagnosis not present

## 2015-10-23 MED ORDER — AMLODIPINE BESYLATE 5 MG PO TABS: 5.0000 mg | ORAL_TABLET | Freq: Every day | ORAL | 3 refills | Status: AC

## 2015-10-23 NOTE — Telephone Encounter (Signed)
Barnetta ChapelMark Watson, Physical Therapy with [facility] called to continue physical therapy twice a week for 4 weeks.  Orders given.  FYI: Patient reported not feeling well, elevated temp and diarrhea.  Patient refusing to contact provider office or go to urgent care/ED for evaluation.  Will forward to PCP.  Call with questions at [phone]. Clovis PuMartin, Tamika L, RN

## 2015-10-23 NOTE — Telephone Encounter (Signed)
Called his physical therapist at [phone] to authorize a verbal order for physical therapy twice a week. Also discussed about the patient's diarrhea, fever & weakness. Per physical therapist, patient refuses to go to ED for evaluation. I attempted to call the patient on his phone. Unfortunately patient didn't answer the phone.

## 2015-10-23 NOTE — Telephone Encounter (Signed)
Authorized the verbal order. Attempted to call the patient as well. Unfortunately he doesn't pick his phone.  Please call and advise him to go to ED if he continues to have the symptoms

## 2015-10-24 ENCOUNTER — Telehealth: Payer: Self-pay | Admitting: *Deleted

## 2015-10-24 NOTE — Telephone Encounter (Signed)
LVM for pt to call the office. If he calls and is still not feeling well, please make him an appt to be seen.  Sunday SpillersSharon T Saunders, CMA

## 2015-10-24 NOTE — Telephone Encounter (Signed)
Pt informed of drs message. [name], CMA

## 2015-10-27 ENCOUNTER — Encounter: Payer: Self-pay | Admitting: Vascular Surgery

## 2015-10-27 DIAGNOSIS — E1122 Type 2 diabetes mellitus with diabetic chronic kidney disease: Secondary | ICD-10-CM | POA: Diagnosis not present

## 2015-10-27 DIAGNOSIS — R339 Retention of urine, unspecified: Secondary | ICD-10-CM | POA: Diagnosis not present

## 2015-10-27 DIAGNOSIS — M5010 Cervical disc disorder with radiculopathy, unspecified cervical region: Secondary | ICD-10-CM | POA: Diagnosis not present

## 2015-10-27 DIAGNOSIS — I5032 Chronic diastolic (congestive) heart failure: Secondary | ICD-10-CM | POA: Diagnosis not present

## 2015-10-27 DIAGNOSIS — Z7982 Long term (current) use of aspirin: Secondary | ICD-10-CM | POA: Diagnosis not present

## 2015-10-27 DIAGNOSIS — E1140 Type 2 diabetes mellitus with diabetic neuropathy, unspecified: Secondary | ICD-10-CM | POA: Diagnosis not present

## 2015-10-27 DIAGNOSIS — L89322 Pressure ulcer of left buttock, stage 2: Secondary | ICD-10-CM | POA: Diagnosis not present

## 2015-10-27 DIAGNOSIS — N184 Chronic kidney disease, stage 4 (severe): Secondary | ICD-10-CM | POA: Diagnosis not present

## 2015-10-27 DIAGNOSIS — Z435 Encounter for attention to cystostomy: Secondary | ICD-10-CM | POA: Diagnosis not present

## 2015-10-27 DIAGNOSIS — I130 Hypertensive heart and chronic kidney disease with heart failure and stage 1 through stage 4 chronic kidney disease, or unspecified chronic kidney disease: Secondary | ICD-10-CM | POA: Diagnosis not present

## 2015-10-27 DIAGNOSIS — E785 Hyperlipidemia, unspecified: Secondary | ICD-10-CM | POA: Diagnosis not present

## 2015-10-27 DIAGNOSIS — E1151 Type 2 diabetes mellitus with diabetic peripheral angiopathy without gangrene: Secondary | ICD-10-CM | POA: Diagnosis not present

## 2015-10-28 ENCOUNTER — Other Ambulatory Visit (HOSPITAL_COMMUNITY): Payer: Self-pay | Admitting: *Deleted

## 2015-10-28 ENCOUNTER — Ambulatory Visit: Payer: Medicare Other | Admitting: Vascular Surgery

## 2015-10-28 DIAGNOSIS — E1151 Type 2 diabetes mellitus with diabetic peripheral angiopathy without gangrene: Secondary | ICD-10-CM | POA: Diagnosis not present

## 2015-10-28 DIAGNOSIS — R339 Retention of urine, unspecified: Secondary | ICD-10-CM | POA: Diagnosis not present

## 2015-10-28 DIAGNOSIS — I130 Hypertensive heart and chronic kidney disease with heart failure and stage 1 through stage 4 chronic kidney disease, or unspecified chronic kidney disease: Secondary | ICD-10-CM | POA: Diagnosis not present

## 2015-10-28 DIAGNOSIS — L89322 Pressure ulcer of left buttock, stage 2: Secondary | ICD-10-CM | POA: Diagnosis not present

## 2015-10-28 DIAGNOSIS — E785 Hyperlipidemia, unspecified: Secondary | ICD-10-CM | POA: Diagnosis not present

## 2015-10-28 DIAGNOSIS — E1140 Type 2 diabetes mellitus with diabetic neuropathy, unspecified: Secondary | ICD-10-CM | POA: Diagnosis not present

## 2015-10-28 DIAGNOSIS — E1122 Type 2 diabetes mellitus with diabetic chronic kidney disease: Secondary | ICD-10-CM | POA: Diagnosis not present

## 2015-10-28 DIAGNOSIS — I5032 Chronic diastolic (congestive) heart failure: Secondary | ICD-10-CM | POA: Diagnosis not present

## 2015-10-28 DIAGNOSIS — N184 Chronic kidney disease, stage 4 (severe): Secondary | ICD-10-CM | POA: Diagnosis not present

## 2015-10-28 DIAGNOSIS — Z435 Encounter for attention to cystostomy: Secondary | ICD-10-CM | POA: Diagnosis not present

## 2015-10-28 DIAGNOSIS — M5010 Cervical disc disorder with radiculopathy, unspecified cervical region: Secondary | ICD-10-CM | POA: Diagnosis not present

## 2015-10-28 DIAGNOSIS — Z7982 Long term (current) use of aspirin: Secondary | ICD-10-CM | POA: Diagnosis not present

## 2015-10-29 ENCOUNTER — Telehealth: Payer: Self-pay | Admitting: Student

## 2015-10-29 ENCOUNTER — Inpatient Hospital Stay (HOSPITAL_COMMUNITY): Admission: RE | Admit: 2015-10-29 | Payer: Medicare Other | Source: Ambulatory Visit

## 2015-10-30 DIAGNOSIS — E1140 Type 2 diabetes mellitus with diabetic neuropathy, unspecified: Secondary | ICD-10-CM | POA: Diagnosis not present

## 2015-10-30 DIAGNOSIS — Z435 Encounter for attention to cystostomy: Secondary | ICD-10-CM | POA: Diagnosis not present

## 2015-10-30 DIAGNOSIS — E1122 Type 2 diabetes mellitus with diabetic chronic kidney disease: Secondary | ICD-10-CM | POA: Diagnosis not present

## 2015-10-30 DIAGNOSIS — E1151 Type 2 diabetes mellitus with diabetic peripheral angiopathy without gangrene: Secondary | ICD-10-CM | POA: Diagnosis not present

## 2015-10-30 DIAGNOSIS — I130 Hypertensive heart and chronic kidney disease with heart failure and stage 1 through stage 4 chronic kidney disease, or unspecified chronic kidney disease: Secondary | ICD-10-CM | POA: Diagnosis not present

## 2015-10-30 DIAGNOSIS — N184 Chronic kidney disease, stage 4 (severe): Secondary | ICD-10-CM | POA: Diagnosis not present

## 2015-10-30 DIAGNOSIS — E785 Hyperlipidemia, unspecified: Secondary | ICD-10-CM | POA: Diagnosis not present

## 2015-10-30 DIAGNOSIS — I5032 Chronic diastolic (congestive) heart failure: Secondary | ICD-10-CM | POA: Diagnosis not present

## 2015-10-30 DIAGNOSIS — Z7982 Long term (current) use of aspirin: Secondary | ICD-10-CM | POA: Diagnosis not present

## 2015-10-30 DIAGNOSIS — M5010 Cervical disc disorder with radiculopathy, unspecified cervical region: Secondary | ICD-10-CM | POA: Diagnosis not present

## 2015-10-30 DIAGNOSIS — R339 Retention of urine, unspecified: Secondary | ICD-10-CM | POA: Diagnosis not present

## 2015-10-30 DIAGNOSIS — L89322 Pressure ulcer of left buttock, stage 2: Secondary | ICD-10-CM | POA: Diagnosis not present

## 2015-10-31 DIAGNOSIS — I5032 Chronic diastolic (congestive) heart failure: Secondary | ICD-10-CM | POA: Diagnosis not present

## 2015-10-31 DIAGNOSIS — E1151 Type 2 diabetes mellitus with diabetic peripheral angiopathy without gangrene: Secondary | ICD-10-CM | POA: Diagnosis not present

## 2015-10-31 DIAGNOSIS — E1140 Type 2 diabetes mellitus with diabetic neuropathy, unspecified: Secondary | ICD-10-CM | POA: Diagnosis not present

## 2015-10-31 DIAGNOSIS — Z7982 Long term (current) use of aspirin: Secondary | ICD-10-CM | POA: Diagnosis not present

## 2015-10-31 DIAGNOSIS — R339 Retention of urine, unspecified: Secondary | ICD-10-CM | POA: Diagnosis not present

## 2015-10-31 DIAGNOSIS — E785 Hyperlipidemia, unspecified: Secondary | ICD-10-CM | POA: Diagnosis not present

## 2015-10-31 DIAGNOSIS — N184 Chronic kidney disease, stage 4 (severe): Secondary | ICD-10-CM | POA: Diagnosis not present

## 2015-10-31 DIAGNOSIS — Z435 Encounter for attention to cystostomy: Secondary | ICD-10-CM | POA: Diagnosis not present

## 2015-10-31 DIAGNOSIS — M5010 Cervical disc disorder with radiculopathy, unspecified cervical region: Secondary | ICD-10-CM | POA: Diagnosis not present

## 2015-10-31 DIAGNOSIS — L89322 Pressure ulcer of left buttock, stage 2: Secondary | ICD-10-CM | POA: Diagnosis not present

## 2015-10-31 DIAGNOSIS — E1122 Type 2 diabetes mellitus with diabetic chronic kidney disease: Secondary | ICD-10-CM | POA: Diagnosis not present

## 2015-10-31 DIAGNOSIS — I130 Hypertensive heart and chronic kidney disease with heart failure and stage 1 through stage 4 chronic kidney disease, or unspecified chronic kidney disease: Secondary | ICD-10-CM | POA: Diagnosis not present

## 2015-11-04 ENCOUNTER — Encounter: Payer: Self-pay | Admitting: Student

## 2015-11-04 ENCOUNTER — Encounter (HOSPITAL_COMMUNITY)
Admission: RE | Admit: 2015-11-04 | Discharge: 2015-11-04 | Disposition: A | Discharge status: Home / Self Care | Payer: Medicare Other | Source: Ambulatory Visit | Attending: Nephrology | Admitting: Nephrology

## 2015-11-04 DIAGNOSIS — N181 Chronic kidney disease, stage 1: Secondary | ICD-10-CM | POA: Diagnosis not present

## 2015-11-04 LAB — IRON AND TIBC
IRON: 41 ug/dL — AB (ref 45–182)
Saturation Ratios: 23 % (ref 17.9–39.5)
TIBC: 182 ug/dL — AB (ref 250–450)
UIBC: 141 ug/dL

## 2015-11-04 LAB — POCT HEMOGLOBIN-HEMACUE: HEMOGLOBIN: 8.3 g/dL — AB (ref 13.0–17.0)

## 2015-11-04 LAB — FERRITIN: Ferritin: 426 ng/mL — ABNORMAL HIGH (ref 24–336)

## 2015-11-04 MED ORDER — DARBEPOETIN ALFA 100 MCG/0.5ML IJ SOSY
PREFILLED_SYRINGE | INTRAMUSCULAR | Status: AC
  Filled 2015-11-04: qty 0.5

## 2015-11-04 MED ORDER — DARBEPOETIN ALFA 100 MCG/0.5ML IJ SOSY
100.0000 ug | PREFILLED_SYRINGE | INTRAMUSCULAR | Status: DC
  Administered 2015-11-04: 100 ug via SUBCUTANEOUS

## 2015-11-04 NOTE — Telephone Encounter (Signed)
Filled lab results request form from [facility] and placed in outgoing fax box today.

## 2015-11-05 ENCOUNTER — Emergency Department (HOSPITAL_COMMUNITY)
Admission: EM | Admit: 2015-11-05 | Discharge: 2015-11-05 | Disposition: A | Discharge status: Home / Self Care | Payer: Medicare Other | Attending: Emergency Medicine | Admitting: Emergency Medicine

## 2015-11-05 ENCOUNTER — Other Ambulatory Visit: Payer: Self-pay | Admitting: Family Medicine

## 2015-11-05 ENCOUNTER — Encounter (HOSPITAL_COMMUNITY): Payer: Self-pay | Admitting: Emergency Medicine

## 2015-11-05 DIAGNOSIS — H5462 Unqualified visual loss, left eye, normal vision right eye: Secondary | ICD-10-CM | POA: Insufficient documentation

## 2015-11-05 DIAGNOSIS — Z466 Encounter for fitting and adjustment of urinary device: Secondary | ICD-10-CM | POA: Diagnosis not present

## 2015-11-05 DIAGNOSIS — R509 Fever, unspecified: Secondary | ICD-10-CM | POA: Diagnosis not present

## 2015-11-05 DIAGNOSIS — Z435 Encounter for attention to cystostomy: Secondary | ICD-10-CM

## 2015-11-05 DIAGNOSIS — N184 Chronic kidney disease, stage 4 (severe): Secondary | ICD-10-CM | POA: Insufficient documentation

## 2015-11-05 DIAGNOSIS — I5030 Unspecified diastolic (congestive) heart failure: Secondary | ICD-10-CM | POA: Insufficient documentation

## 2015-11-05 DIAGNOSIS — T83098A Other mechanical complication of other indwelling urethral catheter, initial encounter: Secondary | ICD-10-CM | POA: Diagnosis not present

## 2015-11-05 DIAGNOSIS — I130 Hypertensive heart and chronic kidney disease with heart failure and stage 1 through stage 4 chronic kidney disease, or unspecified chronic kidney disease: Secondary | ICD-10-CM | POA: Insufficient documentation

## 2015-11-05 DIAGNOSIS — Z7982 Long term (current) use of aspirin: Secondary | ICD-10-CM | POA: Insufficient documentation

## 2015-11-05 DIAGNOSIS — E1022 Type 1 diabetes mellitus with diabetic chronic kidney disease: Secondary | ICD-10-CM | POA: Insufficient documentation

## 2015-11-05 DIAGNOSIS — Z79899 Other long term (current) drug therapy: Secondary | ICD-10-CM | POA: Diagnosis not present

## 2015-11-05 DIAGNOSIS — T83198A Other mechanical complication of other urinary devices and implants, initial encounter: Secondary | ICD-10-CM | POA: Diagnosis not present

## 2015-11-05 DIAGNOSIS — Z4589 Encounter for adjustment and management of other implanted devices: Secondary | ICD-10-CM | POA: Diagnosis not present

## 2015-11-05 DIAGNOSIS — N390 Urinary tract infection, site not specified: Secondary | ICD-10-CM | POA: Diagnosis not present

## 2015-11-05 MED ORDER — BISMUTH SUBSALICYLATE 262 MG/15ML PO SUSP: 30.0000 mL | Freq: Four times a day (QID) | ORAL | 0 refills | Status: AC | PRN

## 2015-11-05 NOTE — ED Notes (Signed)
MD to bedside to insert catheter.

## 2015-11-05 NOTE — ED Triage Notes (Addendum)
Per EMS, patient's care taker was cleaning him this morning and accidentally pulled his suprapubic catheter out at about 7:45 this morning. No bleeding noted. Denies pain. Patient is from home.

## 2015-11-05 NOTE — ED Notes (Signed)
Foley catheter placed at bedside for MD placement

## 2015-11-05 NOTE — ED Notes (Signed)
Bed: WA01 Expected date:  Expected time:  Means of arrival:  Comments: EMS-foley out

## 2015-11-05 NOTE — ED Provider Notes (Signed)
[facility] DEPT Provider Note   CSN: [id number] Arrival date & time: 11/05/15  0844     History   Chief Complaint Chief Complaint  Patient presents with  . Suprapubic Catheter Placement    HPI Lawrence [name] is a 55 y.o. male.  The history is provided by the patient.  Male GU Problem  Primary symptoms comment: suprapubic catheter fell out. This is a new problem. The current episode started 1 to 2 hours ago. The problem occurs constantly. The problem has not changed since onset.Context: pulled accidentally. Pertinent negatives include no anorexia, no nausea, no vomiting and no abdominal pain. Associated symptoms comments: Secondary complaint of intermittent episodes of large loose stools and 2 weeks of progressive left eye vision loss that is painless. There has been no fever.    Past Medical History:  Diagnosis Date  . CHF (congestive heart failure) ([place])   . Chronic kidney disease   . Depression 08/2012   "just when foot first got cut off"  . Fistula   . GERD (gastroesophageal reflux disease)   . H/O hiatal hernia    hx of years ago   . Hypercholesteremia   . Hypertension   . IDDM (insulin dependent diabetes mellitus) ([place])   . Neuropathy ([place])   . Peripheral vascular disease ([place])   . Pneumonia 10/23/2013  . PONV (postoperative nausea and vomiting)     Patient Active Problem List   Diagnosis Date Noted  . 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 Anemia of chronic renal failure 10/23/2015  . Peripheral vascular disease ([place]) 10/23/2015  . Diabetic retinopathy ([place]) 10/23/2015  . 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 Gross hematuria 10/06/2015  . Essential hypertension 09/26/2015  . Depression with anxiety 04/14/2015  . Muscle wasting and atrophy, not elsewhere classified, right upper arm   . Protein-calorie malnutrition, severe 03/12/2015  . Hemorrhoids 03/11/2015  . Physical deconditioning 03/11/2015  . Suprapubic catheter ([place]) 08/09/2014  . Bladder spasms  08/09/2014  . Secondary hyperparathyroidism ([place]) 11/28/2013  . Diastolic heart failure ([place]) 11/12/2013  . Unilateral complete BKA--right 08/22/2012  . Chronic cough 02/22/2012  . Prostate asymmetry 03/23/2011  . Neuropathy of right hand 08/25/2010  . Chronic kidney disease (CKD), stage 4 03/14/2009  . Diabetes ([place]) 12/26/2007  . HYPERCHOLESTEROLEMIA 12/26/2007    Past Surgical History:  Procedure Laterality Date  . ABDOMINAL AORTAGRAM N/A 05/03/2012   Procedure: ABDOMINAL Maxcine Ham;  Surgeon: Elam Dutch, MD;  Location: [facility] CATH LAB;  Service: Cardiovascular;  Laterality: N/A;  . AMPUTATION Right 05/03/2012   Procedure: Right [place] AMPUTATION;  Surgeon: Marybelle Killings, MD;  Location: [place];  Service: Orthopedics;  Laterality: Right;  . AMPUTATION Right 08/16/2012   Procedure: AMPUTATION BELOW KNEE;  Surgeon: Marybelle Killings, MD;  Location: [place];  Service: Orthopedics;  Laterality: Right;  Right Below Knee Amputation  . AMPUTATION Right 10/04/2012   Procedure: AMPUTATION BELOW KNEE REVISION;  Surgeon: Marybelle Killings, MD;  Location: [place];  Service: Orthopedics;  Laterality: Right;  . AV FISTULA PLACEMENT Right 11/01/2013   Procedure: RIGHT ARM ARTERIOVENOUS (AV) FISTULA CREATION;  Surgeon: Angelia Mould, MD;  Location: [place];  Service: Vascular;  Laterality: Right;  . I&D EXTREMITY Right 05/29/2012   Procedure: IRRIGATION AND DEBRIDEMENT EXTREMITY- right foot;  Surgeon: Marybelle Killings, MD;  Location: [place];  Service: Orthopedics;  Laterality: Right;  Right Foot Debridement, VAC Application  . INSERTION OF SUPRAPUBIC CATHETER N/A 03/02/2013   Procedure: INSERTION OF  SUPRAPUBIC CATHETER;  Surgeon: Lowella Bandy, MD;  Location: [facility];  Service: Urology;  Laterality: N/A;  . LIGATION OF ARTERIOVENOUS  FISTULA Right 08/08/2015   Procedure: LIGATION OF ARTERIOVENOUS  FISTULA- right brachiocephalic;  Surgeon: Mal Misty, MD;  Location: [place];  Service: Vascular;  Laterality: Right;  . LOWER  EXTREMITY ANGIOGRAM Right 05/03/2012   Procedure: LOWER EXTREMITY ANGIOGRAM;  Surgeon: Elam Dutch, MD;  Location: [facility] CATH LAB;  Service: Cardiovascular;  Laterality: Right;  rt leg angio  . TONSILLECTOMY  ~ [place] Medications    Prior to Admission medications   Medication Sig Start Date End Date Taking? Authorizing Provider  amLODipine (NORVASC) 5 MG tablet Take 1 tablet (5 mg total) by mouth daily. 10/23/15   Mercy Riding, MD  aspirin 81 MG tablet Take 1 tablet (81 mg total) by mouth daily. Patient taking differently: Take 81 mg by mouth every morning.  07/31/15   Alveda Reasons, MD  atorvastatin (LIPITOR) 40 MG tablet Take 1 tablet by mouth  daily 06/15/15   Alveda Reasons, MD  bisacodyl (DULCOLAX) 5 MG EC tablet Take 5 mg by mouth daily as needed for moderate constipation.    Historical Provider, MD  Brinzolamide-Brimonidine [facility]) 1-0.2 % SUSP Place 1 drop into the left eye 2 (two) times daily.    Historical Provider, MD  calcitRIOL (ROCALTROL) 0.25 MCG capsule Take 1 capsule (0.25 mcg total) by mouth daily. Patient taking differently: Take 0.25 mcg by mouth every Monday, Wednesday, and Friday.  11/01/13   Vivi Barrack, MD  furosemide (LASIX) 40 MG tablet Take 40 mg by mouth 2 (two) times daily. 07/11/15   Historical Provider, MD  gabapentin (NEURONTIN) 100 MG capsule Take 1 capsule by mouth 3  times daily 07/16/15   Alveda Reasons, MD  Insulin Glargine (LANTUS SOLOSTAR) 100 UNIT/ML Solostar Pen Inject 15 Units into the skin daily at 10 pm. 06/10/15   Alveda Reasons, MD  Insulin Pen Needle (EASY TOUCH PEN NEEDLES) 31G X 8 MM MISC USE TO INJECT INSULIN once daily subcutaneously 06/05/15   Alveda Reasons, MD  Lidocaine-Hydrocortisone Ace 3-2.5 % KIT Place 1 applicator rectally 2 (two) times daily. 08/18/15   Mercy Riding, MD  metoprolol tartrate (LOPRESSOR) 25 MG tablet Take 1 tablet by mouth two  times daily 06/15/15   Alveda Reasons, MD  Misc. Devices [facility]) [place]  Standard wheelchair. Use as needed for immobility 07/03/15   Leeanne Rio, MD  Psyllium 100 % POWD Take 3.3 g (1 rounded teaspoon) dissolved in 8 ounces of liquid; may increase gradually up to 3 doses daily 08/18/15   Mercy Riding, MD  solifenacin (VESICARE) 5 MG tablet Take 5 mg by mouth daily.    Historical Provider, MD  Flossie Buffy INSULIN SYRINGE 30G X 1/2" 0.3 ML MISC USE ONE SYRINGE AT BEDTIME 11/15/13   Willeen Niece, MD    Family History Family History  Problem Relation Age of Onset  . Stroke Mother   . Cancer Father     Unsure of type    Social History Social History  Substance Use Topics  . Smoking status: Never Smoker  . Smokeless tobacco: Never Used  . Alcohol use 0.0 oz/week     Comment: rare can of beer     Allergies   Review of patient's allergies indicates no known allergies.   Review of Systems Review of Systems  Gastrointestinal:  Negative for abdominal pain, anorexia, nausea and vomiting.  All other systems reviewed and are negative.    Physical Exam Updated Vital Signs BP (!) 144/49 (BP Location: Left Arm)   Pulse 74   Temp 98.6 F (37 C) (Oral)   Resp 18   Ht 5' 5"  (1.651 m)   Wt 170 lb (77.1 kg)   SpO2 100%   BMI 28.29 kg/m   Physical Exam  Constitutional: He is oriented to person, place, and time. He appears well-developed and well-nourished. No distress.  HENT:  Head: Normocephalic and atraumatic.  Eyes: Conjunctivae are normal. Right eye exhibits normal extraocular motion. Left eye exhibits normal extraocular motion. Pupils are unequal (at baseline per Pt R>L with left reactive).  Neck: Neck supple. No tracheal deviation present.  Cardiovascular: Normal rate, regular rhythm and normal heart sounds.   Pulmonary/Chest: Effort normal and breath sounds normal. No respiratory distress.  Abdominal: Soft. He exhibits no distension. There is no tenderness. There is no rebound and no guarding.  SP site c/d/i  Musculoskeletal: He exhibits no  deformity.  Neurological: He is alert and oriented to person, place, and time.  Skin: Skin is warm and dry.  Psychiatric: He has a normal mood and affect.  Vitals reviewed.    ED Treatments / Results  Labs (all labs ordered are listed, but only abnormal results are displayed) Labs Reviewed - No data to display  EKG  EKG Interpretation None       Radiology No results found.  Procedures SUPRAPUBIC TUBE PLACEMENT Date/Time: 11/05/2015 11:01 AM Performed by: Leo Grosser Authorized by: Leo Grosser   Consent:    Consent obtained:  Verbal   Consent given by:  Patient   Risks discussed:  Bleeding and pain   Alternatives discussed:  No treatment and referral Universal protocol:    Procedure explained and questions answered to patient or proxy's satisfaction: yes     Relevant documents present and verified: yes     Test results available and properly labeled: yes     Imaging studies available: no     Required blood products, implants, devices, and special equipment available: yes     Site/side marked: yes     Immediately prior to procedure a time out was called: yes     Patient identity confirmed:  Verbally with patient, hospital-assigned identification number, arm band and provided demographic data Anesthesia (see MAR for exact dosages):    Anesthesia method:  None Procedure details:    Complexity:  Simple   Catheter type:  Foley   Catheter size: 16 Fr.   Ultrasound guidance: no     Number of attempts:  1   Urine characteristics:  Mildly cloudy Post-procedure details:    Patient tolerance of procedure:  Tolerated well, no immediate complications   (including critical care time)  Emergency Focused Ultrasound Exam Limited Ocular Ultrasound   Performed and interpreted by Dr. Laneta Simmers Indication: painless loss of vision  Linear probe used to scan in two planes to image left eye(s) anterior and posterior chambers with measurement of optic nerve sheath at 80m  depth. Findings: no flap of tissue, no shadowing, no widening of optic nerve sheath Interpretation: no retinal detachment, no foreign body, no increased ICP, lens normal Images archived electronically.  CPT codes: [id number](diagnostic ocular ultrasound)  Medications Ordered in ED Medications - No data to display   Initial Impression / Assessment and Plan / ED Course  I have reviewed the triage vital signs and the  nursing notes.  Pertinent labs & imaging results that were available during my care of the patient were reviewed by me and considered in my medical decision making (see chart for details).  Clinical Course    55 y.o. male presents with accidental removal of SPT this morning. Replaced without difficulty with same size Pt had previously. Can f/u with urology PRN for management of this. Pt has secondary complaints of having large loose bowel movement weekly. I recommended PRN bismuth salts instead of anti motility agents for diarrhea episodes. There is no evidence of retinal detachment on left, right eye has poor baseline vision and visual field is grossly in tact with some loss of acuity on left. Recommended OP ophthalmology f/u for formal assessment. Plan to follow up with PCP as needed and return precautions discussed for worsening or new concerning symptoms.   Final Clinical Impressions(s) / ED Diagnoses   Final diagnoses:  Encounter for care or replacement of suprapubic tube [facility])  Vision loss of left eye    New Prescriptions New Prescriptions   No medications on file     Leo Grosser, MD 11/05/15 9102

## 2015-11-05 NOTE — ED Notes (Signed)
[facility] notified of need for transport home 

## 2015-11-06 DIAGNOSIS — H4089 Other specified glaucoma: Secondary | ICD-10-CM | POA: Diagnosis not present

## 2015-11-06 DIAGNOSIS — H4312 Vitreous hemorrhage, left eye: Secondary | ICD-10-CM | POA: Diagnosis not present

## 2015-11-06 DIAGNOSIS — H44521 Atrophy of globe, right eye: Secondary | ICD-10-CM | POA: Diagnosis not present

## 2015-11-06 DIAGNOSIS — E113512 Type 2 diabetes mellitus with proliferative diabetic retinopathy with macular edema, left eye: Secondary | ICD-10-CM | POA: Diagnosis not present

## 2015-11-06 DIAGNOSIS — E113599 Type 2 diabetes mellitus with proliferative diabetic retinopathy without macular edema, unspecified eye: Secondary | ICD-10-CM | POA: Diagnosis not present

## 2015-11-07 DIAGNOSIS — E1151 Type 2 diabetes mellitus with diabetic peripheral angiopathy without gangrene: Secondary | ICD-10-CM | POA: Diagnosis not present

## 2015-11-07 DIAGNOSIS — E1140 Type 2 diabetes mellitus with diabetic neuropathy, unspecified: Secondary | ICD-10-CM | POA: Diagnosis not present

## 2015-11-07 DIAGNOSIS — M5010 Cervical disc disorder with radiculopathy, unspecified cervical region: Secondary | ICD-10-CM | POA: Diagnosis not present

## 2015-11-07 DIAGNOSIS — R339 Retention of urine, unspecified: Secondary | ICD-10-CM | POA: Diagnosis not present

## 2015-11-07 DIAGNOSIS — N184 Chronic kidney disease, stage 4 (severe): Secondary | ICD-10-CM | POA: Diagnosis not present

## 2015-11-07 DIAGNOSIS — Z435 Encounter for attention to cystostomy: Secondary | ICD-10-CM | POA: Diagnosis not present

## 2015-11-07 DIAGNOSIS — I130 Hypertensive heart and chronic kidney disease with heart failure and stage 1 through stage 4 chronic kidney disease, or unspecified chronic kidney disease: Secondary | ICD-10-CM | POA: Diagnosis not present

## 2015-11-07 DIAGNOSIS — I5032 Chronic diastolic (congestive) heart failure: Secondary | ICD-10-CM | POA: Diagnosis not present

## 2015-11-07 DIAGNOSIS — L89322 Pressure ulcer of left buttock, stage 2: Secondary | ICD-10-CM | POA: Diagnosis not present

## 2015-11-07 DIAGNOSIS — Z7982 Long term (current) use of aspirin: Secondary | ICD-10-CM | POA: Diagnosis not present

## 2015-11-07 DIAGNOSIS — E785 Hyperlipidemia, unspecified: Secondary | ICD-10-CM | POA: Diagnosis not present

## 2015-11-07 DIAGNOSIS — E1122 Type 2 diabetes mellitus with diabetic chronic kidney disease: Secondary | ICD-10-CM | POA: Diagnosis not present

## 2015-11-09 DIAGNOSIS — R2689 Other abnormalities of gait and mobility: Secondary | ICD-10-CM | POA: Diagnosis not present

## 2015-11-09 DIAGNOSIS — E1140 Type 2 diabetes mellitus with diabetic neuropathy, unspecified: Secondary | ICD-10-CM | POA: Diagnosis not present

## 2015-11-10 ENCOUNTER — Telehealth: Payer: Self-pay | Admitting: Student

## 2015-11-10 DIAGNOSIS — R339 Retention of urine, unspecified: Secondary | ICD-10-CM | POA: Diagnosis not present

## 2015-11-10 DIAGNOSIS — E785 Hyperlipidemia, unspecified: Secondary | ICD-10-CM | POA: Diagnosis not present

## 2015-11-10 DIAGNOSIS — L89322 Pressure ulcer of left buttock, stage 2: Secondary | ICD-10-CM | POA: Diagnosis not present

## 2015-11-10 DIAGNOSIS — E1122 Type 2 diabetes mellitus with diabetic chronic kidney disease: Secondary | ICD-10-CM | POA: Diagnosis not present

## 2015-11-10 DIAGNOSIS — Z435 Encounter for attention to cystostomy: Secondary | ICD-10-CM | POA: Diagnosis not present

## 2015-11-10 DIAGNOSIS — Z7982 Long term (current) use of aspirin: Secondary | ICD-10-CM | POA: Diagnosis not present

## 2015-11-10 DIAGNOSIS — E1151 Type 2 diabetes mellitus with diabetic peripheral angiopathy without gangrene: Secondary | ICD-10-CM | POA: Diagnosis not present

## 2015-11-10 DIAGNOSIS — M5010 Cervical disc disorder with radiculopathy, unspecified cervical region: Secondary | ICD-10-CM | POA: Diagnosis not present

## 2015-11-10 DIAGNOSIS — E1140 Type 2 diabetes mellitus with diabetic neuropathy, unspecified: Secondary | ICD-10-CM | POA: Diagnosis not present

## 2015-11-10 DIAGNOSIS — I130 Hypertensive heart and chronic kidney disease with heart failure and stage 1 through stage 4 chronic kidney disease, or unspecified chronic kidney disease: Secondary | ICD-10-CM | POA: Diagnosis not present

## 2015-11-10 DIAGNOSIS — I5032 Chronic diastolic (congestive) heart failure: Secondary | ICD-10-CM | POA: Diagnosis not present

## 2015-11-10 DIAGNOSIS — N184 Chronic kidney disease, stage 4 (severe): Secondary | ICD-10-CM | POA: Diagnosis not present

## 2015-11-10 NOTE — Telephone Encounter (Signed)
Pt called and would like a referral to see a gastro doctor. jw

## 2015-11-13 DIAGNOSIS — M5010 Cervical disc disorder with radiculopathy, unspecified cervical region: Secondary | ICD-10-CM | POA: Diagnosis not present

## 2015-11-13 DIAGNOSIS — E1140 Type 2 diabetes mellitus with diabetic neuropathy, unspecified: Secondary | ICD-10-CM | POA: Diagnosis not present

## 2015-11-13 DIAGNOSIS — Z7982 Long term (current) use of aspirin: Secondary | ICD-10-CM | POA: Diagnosis not present

## 2015-11-13 DIAGNOSIS — E1122 Type 2 diabetes mellitus with diabetic chronic kidney disease: Secondary | ICD-10-CM | POA: Diagnosis not present

## 2015-11-13 DIAGNOSIS — E785 Hyperlipidemia, unspecified: Secondary | ICD-10-CM | POA: Diagnosis not present

## 2015-11-13 DIAGNOSIS — I5032 Chronic diastolic (congestive) heart failure: Secondary | ICD-10-CM | POA: Diagnosis not present

## 2015-11-13 DIAGNOSIS — E1151 Type 2 diabetes mellitus with diabetic peripheral angiopathy without gangrene: Secondary | ICD-10-CM | POA: Diagnosis not present

## 2015-11-13 DIAGNOSIS — I130 Hypertensive heart and chronic kidney disease with heart failure and stage 1 through stage 4 chronic kidney disease, or unspecified chronic kidney disease: Secondary | ICD-10-CM | POA: Diagnosis not present

## 2015-11-13 DIAGNOSIS — L89322 Pressure ulcer of left buttock, stage 2: Secondary | ICD-10-CM | POA: Diagnosis not present

## 2015-11-13 DIAGNOSIS — R339 Retention of urine, unspecified: Secondary | ICD-10-CM | POA: Diagnosis not present

## 2015-11-13 DIAGNOSIS — N184 Chronic kidney disease, stage 4 (severe): Secondary | ICD-10-CM | POA: Diagnosis not present

## 2015-11-13 DIAGNOSIS — Z435 Encounter for attention to cystostomy: Secondary | ICD-10-CM | POA: Diagnosis not present

## 2015-11-14 ENCOUNTER — Telehealth: Payer: Self-pay | Admitting: Student

## 2015-11-14 NOTE — Telephone Encounter (Signed)
[facility] needs verbal orders to continue to monitor skin intregrity for once a week for 9 weeks.

## 2015-11-17 DIAGNOSIS — E1140 Type 2 diabetes mellitus with diabetic neuropathy, unspecified: Secondary | ICD-10-CM | POA: Diagnosis not present

## 2015-11-17 DIAGNOSIS — Z794 Long term (current) use of insulin: Secondary | ICD-10-CM | POA: Diagnosis not present

## 2015-11-17 DIAGNOSIS — M5010 Cervical disc disorder with radiculopathy, unspecified cervical region: Secondary | ICD-10-CM | POA: Diagnosis not present

## 2015-11-17 DIAGNOSIS — E1122 Type 2 diabetes mellitus with diabetic chronic kidney disease: Secondary | ICD-10-CM | POA: Diagnosis not present

## 2015-11-17 DIAGNOSIS — Z7982 Long term (current) use of aspirin: Secondary | ICD-10-CM | POA: Diagnosis not present

## 2015-11-17 DIAGNOSIS — I5032 Chronic diastolic (congestive) heart failure: Secondary | ICD-10-CM | POA: Diagnosis not present

## 2015-11-17 DIAGNOSIS — E1151 Type 2 diabetes mellitus with diabetic peripheral angiopathy without gangrene: Secondary | ICD-10-CM | POA: Diagnosis not present

## 2015-11-17 DIAGNOSIS — N184 Chronic kidney disease, stage 4 (severe): Secondary | ICD-10-CM | POA: Diagnosis not present

## 2015-11-17 DIAGNOSIS — Z435 Encounter for attention to cystostomy: Secondary | ICD-10-CM | POA: Diagnosis not present

## 2015-11-17 DIAGNOSIS — E785 Hyperlipidemia, unspecified: Secondary | ICD-10-CM | POA: Diagnosis not present

## 2015-11-17 DIAGNOSIS — I130 Hypertensive heart and chronic kidney disease with heart failure and stage 1 through stage 4 chronic kidney disease, or unspecified chronic kidney disease: Secondary | ICD-10-CM | POA: Diagnosis not present

## 2015-11-17 DIAGNOSIS — R339 Retention of urine, unspecified: Secondary | ICD-10-CM | POA: Diagnosis not present

## 2015-11-17 NOTE — Telephone Encounter (Signed)
Verbal place authorized. Left voice mail for Valentina Lucks .  Thanks! Alwyn Ren

## 2015-11-17 NOTE — Telephone Encounter (Signed)
Attempted to call patient to know the reason for GI referral. Patient doesn't pick his phone. I have never discussed GI issue with him in clinic. Please call to find out the reason for referral. Thanks! Alwyn Renaye

## 2015-11-18 NOTE — Telephone Encounter (Signed)
LVM for pt to call back to inquire about below. Zimmerman Rumple, April D, CMA  

## 2015-11-19 DIAGNOSIS — M5010 Cervical disc disorder with radiculopathy, unspecified cervical region: Secondary | ICD-10-CM | POA: Diagnosis not present

## 2015-11-19 DIAGNOSIS — I5032 Chronic diastolic (congestive) heart failure: Secondary | ICD-10-CM | POA: Diagnosis not present

## 2015-11-19 DIAGNOSIS — N184 Chronic kidney disease, stage 4 (severe): Secondary | ICD-10-CM | POA: Diagnosis not present

## 2015-11-19 DIAGNOSIS — I130 Hypertensive heart and chronic kidney disease with heart failure and stage 1 through stage 4 chronic kidney disease, or unspecified chronic kidney disease: Secondary | ICD-10-CM | POA: Diagnosis not present

## 2015-11-19 DIAGNOSIS — Z7982 Long term (current) use of aspirin: Secondary | ICD-10-CM | POA: Diagnosis not present

## 2015-11-19 DIAGNOSIS — Z794 Long term (current) use of insulin: Secondary | ICD-10-CM | POA: Diagnosis not present

## 2015-11-19 DIAGNOSIS — R339 Retention of urine, unspecified: Secondary | ICD-10-CM | POA: Diagnosis not present

## 2015-11-19 DIAGNOSIS — E785 Hyperlipidemia, unspecified: Secondary | ICD-10-CM | POA: Diagnosis not present

## 2015-11-19 DIAGNOSIS — Z435 Encounter for attention to cystostomy: Secondary | ICD-10-CM | POA: Diagnosis not present

## 2015-11-19 DIAGNOSIS — E1140 Type 2 diabetes mellitus with diabetic neuropathy, unspecified: Secondary | ICD-10-CM | POA: Diagnosis not present

## 2015-11-19 DIAGNOSIS — E1151 Type 2 diabetes mellitus with diabetic peripheral angiopathy without gangrene: Secondary | ICD-10-CM | POA: Diagnosis not present

## 2015-11-19 DIAGNOSIS — E1122 Type 2 diabetes mellitus with diabetic chronic kidney disease: Secondary | ICD-10-CM | POA: Diagnosis not present

## 2015-11-19 NOTE — Telephone Encounter (Signed)
Pt is returning April's call about the GI referral. jw

## 2015-11-20 ENCOUNTER — Encounter: Payer: Self-pay | Admitting: Vascular Surgery

## 2015-11-20 ENCOUNTER — Other Ambulatory Visit: Payer: Self-pay | Admitting: Family Medicine

## 2015-11-20 DIAGNOSIS — E7800 Pure hypercholesterolemia, unspecified: Secondary | ICD-10-CM

## 2015-11-20 DIAGNOSIS — E1140 Type 2 diabetes mellitus with diabetic neuropathy, unspecified: Secondary | ICD-10-CM | POA: Diagnosis not present

## 2015-11-20 DIAGNOSIS — E785 Hyperlipidemia, unspecified: Secondary | ICD-10-CM | POA: Diagnosis not present

## 2015-11-20 DIAGNOSIS — Z794 Long term (current) use of insulin: Secondary | ICD-10-CM | POA: Diagnosis not present

## 2015-11-20 DIAGNOSIS — I130 Hypertensive heart and chronic kidney disease with heart failure and stage 1 through stage 4 chronic kidney disease, or unspecified chronic kidney disease: Secondary | ICD-10-CM | POA: Diagnosis not present

## 2015-11-20 DIAGNOSIS — E1151 Type 2 diabetes mellitus with diabetic peripheral angiopathy without gangrene: Secondary | ICD-10-CM | POA: Diagnosis not present

## 2015-11-20 DIAGNOSIS — N184 Chronic kidney disease, stage 4 (severe): Secondary | ICD-10-CM | POA: Diagnosis not present

## 2015-11-20 DIAGNOSIS — I10 Essential (primary) hypertension: Secondary | ICD-10-CM

## 2015-11-20 DIAGNOSIS — E1122 Type 2 diabetes mellitus with diabetic chronic kidney disease: Secondary | ICD-10-CM | POA: Diagnosis not present

## 2015-11-20 DIAGNOSIS — I5032 Chronic diastolic (congestive) heart failure: Secondary | ICD-10-CM | POA: Diagnosis not present

## 2015-11-20 DIAGNOSIS — Z435 Encounter for attention to cystostomy: Secondary | ICD-10-CM | POA: Diagnosis not present

## 2015-11-20 DIAGNOSIS — R339 Retention of urine, unspecified: Secondary | ICD-10-CM | POA: Diagnosis not present

## 2015-11-20 DIAGNOSIS — Z7982 Long term (current) use of aspirin: Secondary | ICD-10-CM | POA: Diagnosis not present

## 2015-11-20 DIAGNOSIS — M5010 Cervical disc disorder with radiculopathy, unspecified cervical region: Secondary | ICD-10-CM | POA: Diagnosis not present

## 2015-11-21 NOTE — Telephone Encounter (Signed)
Tried to return pt call and lvm for him to call office back.  If pt calls back we need to find out why he is needing a GI referral.  Lamonte SakaiZimmerman Rumple, April D, CMA

## 2015-11-21 NOTE — Telephone Encounter (Signed)
Pt returned call and stated that he is having diarrhea everytime he uses the bathroom and that his OT told him that he should probably see a GI doctor for it. Also asked had he tried any medications to help with this and he said no.  He stated that you will also be getting some RX request via fax from [facility] and also that you will be receiving something from [facility] to continue care.  Told pt I would let you know so you could be on the look out for these. Told pt we would let him know something about the referral, please advise. Lawrence [name], April D, [place]

## 2015-11-24 DIAGNOSIS — E1151 Type 2 diabetes mellitus with diabetic peripheral angiopathy without gangrene: Secondary | ICD-10-CM | POA: Diagnosis not present

## 2015-11-24 DIAGNOSIS — E1140 Type 2 diabetes mellitus with diabetic neuropathy, unspecified: Secondary | ICD-10-CM | POA: Diagnosis not present

## 2015-11-24 DIAGNOSIS — M5010 Cervical disc disorder with radiculopathy, unspecified cervical region: Secondary | ICD-10-CM | POA: Diagnosis not present

## 2015-11-24 DIAGNOSIS — I5032 Chronic diastolic (congestive) heart failure: Secondary | ICD-10-CM | POA: Diagnosis not present

## 2015-11-24 DIAGNOSIS — E1122 Type 2 diabetes mellitus with diabetic chronic kidney disease: Secondary | ICD-10-CM | POA: Diagnosis not present

## 2015-11-24 DIAGNOSIS — E785 Hyperlipidemia, unspecified: Secondary | ICD-10-CM | POA: Diagnosis not present

## 2015-11-24 DIAGNOSIS — Z435 Encounter for attention to cystostomy: Secondary | ICD-10-CM | POA: Diagnosis not present

## 2015-11-24 DIAGNOSIS — Z7982 Long term (current) use of aspirin: Secondary | ICD-10-CM | POA: Diagnosis not present

## 2015-11-24 DIAGNOSIS — N184 Chronic kidney disease, stage 4 (severe): Secondary | ICD-10-CM | POA: Diagnosis not present

## 2015-11-24 DIAGNOSIS — Z794 Long term (current) use of insulin: Secondary | ICD-10-CM | POA: Diagnosis not present

## 2015-11-24 DIAGNOSIS — I130 Hypertensive heart and chronic kidney disease with heart failure and stage 1 through stage 4 chronic kidney disease, or unspecified chronic kidney disease: Secondary | ICD-10-CM | POA: Diagnosis not present

## 2015-11-24 DIAGNOSIS — R339 Retention of urine, unspecified: Secondary | ICD-10-CM | POA: Diagnosis not present

## 2015-11-25 NOTE — Telephone Encounter (Signed)
Called and left a voicemail for the patient. I recommended office visit for evaluation. I don't think he needs GI referral for acute diarrhea. He also declined colonoscopy in the past.  Thank you! Dr. Alanda SlimGonfa.

## 2015-11-26 ENCOUNTER — Ambulatory Visit (INDEPENDENT_AMBULATORY_CARE_PROVIDER_SITE_OTHER): Payer: Medicare Other | Admitting: Vascular Surgery

## 2015-11-26 ENCOUNTER — Encounter: Payer: Self-pay | Admitting: Vascular Surgery

## 2015-11-26 VITALS — BP 128/59 | HR 52 | Temp 97.9°F | Resp 16 | Ht 65.0 in

## 2015-11-26 DIAGNOSIS — N185 Chronic kidney disease, stage 5: Secondary | ICD-10-CM | POA: Diagnosis not present

## 2015-11-26 NOTE — Progress Notes (Signed)
Vascular and Vein Specialist of [facility]  Patient name: Lawrence [name] MRN: [id number] DOB: 18-Mar-1960 Sex: male  REASON FOR VISIT: Discuss hemodialysis access options  HPI: Lawrence [name] is a 55 y.o. male here today to discuss hemodialysis access options. He did have a arm fistula created in September 2015. He does initially did well and did not require this for hemodialysis. He elevated the contractures in both hands was felt that this may partially be related to still in his right hand. He has no radial pulses bilaterally. He underwent ligation of his fistula. He does have some progression of his renal insufficiency is seen today for discussion of access options. As have a prior right below-knee amputation. Also has no history of tissue loss on his left leg.  Past Medical History:  Diagnosis Date  . CHF (congestive heart failure) ([place])   . Chronic kidney disease   . Depression 08/2012   "just when foot first got cut off"  . Fistula   . GERD (gastroesophageal reflux disease)   . H/O hiatal hernia    hx of years ago   . Hypercholesteremia   . Hypertension   . IDDM (insulin dependent diabetes mellitus) ([place])   . Neuropathy ([place])   . Peripheral vascular disease ([place])   . Pneumonia 10/23/2013  . PONV (postoperative nausea and vomiting)     Family History  Problem Relation Age of Onset  . Stroke Mother   . Cancer Father     Unsure of type    SOCIAL HISTORY: Social History  Substance Use Topics  . Smoking status: Never Smoker  . Smokeless tobacco: Never Used  . Alcohol use 0.0 oz/week     Comment: rare can of beer    No Known Allergies  Current Outpatient Prescriptions  Medication Sig Dispense Refill  . amLODipine (NORVASC) 5 MG tablet Take 1 tablet (5 mg total) by mouth daily. 90 tablet 3  . aspirin 81 MG tablet Take 1 tablet (81 mg total) by mouth daily. (Patient taking differently: Take 81 mg by mouth every morning. ) 90 tablet 3    . atorvastatin (LIPITOR) 40 MG tablet TAKE 1 TABLET BY MOUTH  DAILY 90 tablet 3  . bisacodyl (DULCOLAX) 5 MG EC tablet Take 5 mg by mouth daily as needed for moderate constipation.    . bismuth subsalicylate (PEPTO-BISMOL) 262 MG/15ML suspension Take 30 mLs by mouth every 6 (six) hours as needed for diarrhea or loose stools. 360 mL 0  . Brinzolamide-Brimonidine (SIMBRINZA) 1-0.2 % SUSP Place 1 drop into the left eye 2 (two) times daily.    . calcitRIOL (ROCALTROL) 0.25 MCG capsule Take 1 capsule (0.25 mcg total) by mouth daily. (Patient taking differently: Take 0.25 mcg by mouth every Monday, Wednesday, and Friday. ) 30 capsule 0  . furosemide (LASIX) 40 MG tablet Take 40 mg by mouth 2 (two) times daily.    Marland Kitchen gabapentin (NEURONTIN) 100 MG capsule TAKE 1 CAPSULE BY MOUTH 3  TIMES DAILY 270 capsule 3  . Insulin Glargine (LANTUS SOLOSTAR) 100 UNIT/ML Solostar Pen Inject 15 Units into the skin daily at 10 pm. 15 mL 11  . Insulin Pen Needle (EASY TOUCH PEN NEEDLES) 31G X 8 MM MISC USE TO INJECT INSULIN once daily subcutaneously 100 each 4  . Lidocaine-Hydrocortisone Ace 3-2.5 % KIT Place 1 applicator rectally 2 (two) times daily. 1 each 0  . metoprolol tartrate (LOPRESSOR) 25 MG tablet TAKE 1 TABLET BY MOUTH TWO  TIMES  DAILY 180 tablet 3  . Misc. Devices [facility]) [place] Standard wheelchair. Use as needed for immobility 1 each 0  . Psyllium 100 % POWD Take 3.3 g (1 rounded teaspoon) dissolved in 8 ounces of liquid; may increase gradually up to 3 doses daily 226 g 0  . solifenacin (VESICARE) 5 MG tablet Take 5 mg by mouth daily.    . timolol (TIMOPTIC) 0.5 % ophthalmic solution     . ULTICARE INSULIN SYRINGE 30G X 1/2" 0.3 ML MISC USE ONE SYRINGE AT BEDTIME 100 each 0   No current facility-administered medications for this visit.     REVIEW OF SYSTEMS:  _0  denotes positive finding, _1  denotes negative finding Cardiac  Comments:  Chest pain or chest pressure:    Shortness of breath upon  exertion:    Short of breath when lying flat:    Irregular heart rhythm:        Vascular    Pain in calf, thigh, or hip brought on by ambulation:    Pain in feet at night that wakes you up from your sleep:     Blood clot in your veins:    Leg swelling:           PHYSICAL EXAM: Vitals:   11/26/15 1303  BP: (!) 128/59  Pulse: (!) 52  Resp: 16  Temp: 97.9 F (36.6 C)  SpO2: 100%  Height: _2  (1.651 m)    GENERAL: The patient is a well-nourished male, in no acute distress. The vital signs are documented above. CARDIOVASCULAR: No palpable radial or ulnar pulses bilaterally. Has 2+ femoral pulses bilaterally. No left popliteal or distal pulses PULMONARY: There is good air exchange  Given his obese with a large pannus MUSCULOSKELETAL: There are no major deformities or cyanosis. NEUROLOGIC: No focal weakness or paresthesias are detected. SKIN: There are no ulcers or rashes noted. PSYCHIATRIC: The patient has a normal affect.    MEDICAL ISSUES: Rest of renal insufficiency here today for discussion of access options. He will be a very difficult long-term access patient. Do not feel he is a candidate for upper extremity access due to no radial pulses and severe contracture bilaterally. Is have femoral pulses. I would be quite hesitant to place a graft in his left leg with the known peripheral arterial insufficiency. Would feel comfortable with a right femoral loop AV Gore-Tex graft above the level of his right leg amputation. Feel that this would be his best next option for hemodialysis. Currently is not on hemodialysis and has an appointment to see Dr. Lorrene [name] on November 8. He would require one night hospitalization left femoral loop graft placement. We are available to places whenever felt appropriate by Dr. Lorrene [name].    Rosetta Posner, MD [facility] and [facility] Specialists of [facility] [phone] Pager [phone]

## 2015-11-27 ENCOUNTER — Other Ambulatory Visit: Payer: Self-pay | Admitting: Student

## 2015-11-27 ENCOUNTER — Telehealth: Payer: Self-pay | Admitting: Student

## 2015-11-27 DIAGNOSIS — M5010 Cervical disc disorder with radiculopathy, unspecified cervical region: Secondary | ICD-10-CM | POA: Diagnosis not present

## 2015-11-27 DIAGNOSIS — E1122 Type 2 diabetes mellitus with diabetic chronic kidney disease: Secondary | ICD-10-CM | POA: Diagnosis not present

## 2015-11-27 DIAGNOSIS — E1140 Type 2 diabetes mellitus with diabetic neuropathy, unspecified: Secondary | ICD-10-CM | POA: Diagnosis not present

## 2015-11-27 DIAGNOSIS — N184 Chronic kidney disease, stage 4 (severe): Secondary | ICD-10-CM | POA: Diagnosis not present

## 2015-11-27 DIAGNOSIS — I5032 Chronic diastolic (congestive) heart failure: Secondary | ICD-10-CM | POA: Diagnosis not present

## 2015-11-27 DIAGNOSIS — Z794 Long term (current) use of insulin: Secondary | ICD-10-CM | POA: Diagnosis not present

## 2015-11-27 DIAGNOSIS — R339 Retention of urine, unspecified: Secondary | ICD-10-CM | POA: Diagnosis not present

## 2015-11-27 DIAGNOSIS — I130 Hypertensive heart and chronic kidney disease with heart failure and stage 1 through stage 4 chronic kidney disease, or unspecified chronic kidney disease: Secondary | ICD-10-CM | POA: Diagnosis not present

## 2015-11-27 DIAGNOSIS — Z435 Encounter for attention to cystostomy: Secondary | ICD-10-CM | POA: Diagnosis not present

## 2015-11-27 DIAGNOSIS — E785 Hyperlipidemia, unspecified: Secondary | ICD-10-CM | POA: Diagnosis not present

## 2015-11-27 DIAGNOSIS — Z7982 Long term (current) use of aspirin: Secondary | ICD-10-CM | POA: Diagnosis not present

## 2015-11-27 DIAGNOSIS — E1151 Type 2 diabetes mellitus with diabetic peripheral angiopathy without gangrene: Secondary | ICD-10-CM | POA: Diagnosis not present

## 2015-11-27 NOTE — Telephone Encounter (Signed)
Pt says the pharmacy called him and stated dr Alanda Slimgonfa wasn't signing off on his prescripton.  He doesn't know which one it is.  He said he thought April would know which

## 2015-11-27 NOTE — Telephone Encounter (Signed)
Pt would like orders for a Home Health Aid. Pt says to call if you have any questions. Please advise. Thanks! ep

## 2015-11-27 NOTE — Telephone Encounter (Signed)
Appointment on 12/02/15 with pcp. [name],CMA

## 2015-11-28 DIAGNOSIS — I5032 Chronic diastolic (congestive) heart failure: Secondary | ICD-10-CM | POA: Diagnosis not present

## 2015-11-28 DIAGNOSIS — M5010 Cervical disc disorder with radiculopathy, unspecified cervical region: Secondary | ICD-10-CM | POA: Diagnosis not present

## 2015-11-28 DIAGNOSIS — R339 Retention of urine, unspecified: Secondary | ICD-10-CM | POA: Diagnosis not present

## 2015-11-28 DIAGNOSIS — E1151 Type 2 diabetes mellitus with diabetic peripheral angiopathy without gangrene: Secondary | ICD-10-CM | POA: Diagnosis not present

## 2015-11-28 DIAGNOSIS — N184 Chronic kidney disease, stage 4 (severe): Secondary | ICD-10-CM | POA: Diagnosis not present

## 2015-11-28 DIAGNOSIS — Z7982 Long term (current) use of aspirin: Secondary | ICD-10-CM | POA: Diagnosis not present

## 2015-11-28 DIAGNOSIS — E1140 Type 2 diabetes mellitus with diabetic neuropathy, unspecified: Secondary | ICD-10-CM | POA: Diagnosis not present

## 2015-11-28 DIAGNOSIS — E785 Hyperlipidemia, unspecified: Secondary | ICD-10-CM | POA: Diagnosis not present

## 2015-11-28 DIAGNOSIS — I130 Hypertensive heart and chronic kidney disease with heart failure and stage 1 through stage 4 chronic kidney disease, or unspecified chronic kidney disease: Secondary | ICD-10-CM | POA: Diagnosis not present

## 2015-11-28 DIAGNOSIS — Z794 Long term (current) use of insulin: Secondary | ICD-10-CM | POA: Diagnosis not present

## 2015-11-28 DIAGNOSIS — E1122 Type 2 diabetes mellitus with diabetic chronic kidney disease: Secondary | ICD-10-CM | POA: Diagnosis not present

## 2015-11-28 DIAGNOSIS — Z435 Encounter for attention to cystostomy: Secondary | ICD-10-CM | POA: Diagnosis not present

## 2015-11-28 NOTE — Telephone Encounter (Signed)
Gave the verbal order per Dr. Alanda SlimGonfa. Lamonte SakaiZimmerman Rumple, Yuliet Needs D, [place]

## 2015-11-28 NOTE — Telephone Encounter (Signed)
Routing to PCP to see if he knows which medication they may be referencing. Lamonte SakaiZimmerman Rumple, Dhiren Azimi D, [place]

## 2015-11-28 NOTE — Telephone Encounter (Signed)
LVM for Lawrence ChapelMark Watson ([facility]) to call back to inform him of below. Lamonte SakaiZimmerman Rumple, April D, [place]

## 2015-11-28 NOTE — Telephone Encounter (Signed)
I am okay with this. Thanks! Alwyn Renaye

## 2015-11-28 NOTE — Telephone Encounter (Signed)
I don't remember denying a refill request for him. Can he call and find out the name of the medicine. They can send a refill request again. Thanks! Lawrence [name]

## 2015-11-28 NOTE — Telephone Encounter (Signed)
Contacted pt and gave him the below message and he said that it was not us that denied his RX and he said he would see us on the 30th. Lamonte SakaiZimmerman Rumple, April D, [place]

## 2015-11-28 NOTE — Telephone Encounter (Signed)
Physical therapist needs verbal order fpr physical therapy for twice a week for 4 weeks

## 2015-12-01 ENCOUNTER — Inpatient Hospital Stay (HOSPITAL_COMMUNITY): Admission: RE | Admit: 2015-12-01 | Payer: Medicare Other | Source: Ambulatory Visit

## 2015-12-02 ENCOUNTER — Ambulatory Visit: Payer: Medicare Other | Admitting: Student

## 2015-12-03 ENCOUNTER — Telehealth: Payer: Self-pay | Admitting: Student

## 2015-12-03 DIAGNOSIS — M5010 Cervical disc disorder with radiculopathy, unspecified cervical region: Secondary | ICD-10-CM | POA: Diagnosis not present

## 2015-12-03 DIAGNOSIS — I130 Hypertensive heart and chronic kidney disease with heart failure and stage 1 through stage 4 chronic kidney disease, or unspecified chronic kidney disease: Secondary | ICD-10-CM | POA: Diagnosis not present

## 2015-12-03 DIAGNOSIS — E1140 Type 2 diabetes mellitus with diabetic neuropathy, unspecified: Secondary | ICD-10-CM | POA: Diagnosis not present

## 2015-12-03 DIAGNOSIS — I5032 Chronic diastolic (congestive) heart failure: Secondary | ICD-10-CM | POA: Diagnosis not present

## 2015-12-03 DIAGNOSIS — Z794 Long term (current) use of insulin: Secondary | ICD-10-CM | POA: Diagnosis not present

## 2015-12-03 DIAGNOSIS — E1151 Type 2 diabetes mellitus with diabetic peripheral angiopathy without gangrene: Secondary | ICD-10-CM | POA: Diagnosis not present

## 2015-12-03 DIAGNOSIS — E1122 Type 2 diabetes mellitus with diabetic chronic kidney disease: Secondary | ICD-10-CM | POA: Diagnosis not present

## 2015-12-03 DIAGNOSIS — R339 Retention of urine, unspecified: Secondary | ICD-10-CM | POA: Diagnosis not present

## 2015-12-03 DIAGNOSIS — Z435 Encounter for attention to cystostomy: Secondary | ICD-10-CM | POA: Diagnosis not present

## 2015-12-03 DIAGNOSIS — E785 Hyperlipidemia, unspecified: Secondary | ICD-10-CM | POA: Diagnosis not present

## 2015-12-03 DIAGNOSIS — N184 Chronic kidney disease, stage 4 (severe): Secondary | ICD-10-CM | POA: Diagnosis not present

## 2015-12-03 DIAGNOSIS — Z7982 Long term (current) use of aspirin: Secondary | ICD-10-CM | POA: Diagnosis not present

## 2015-12-03 NOTE — Telephone Encounter (Signed)
Pt has been asking for a home health aid for several weeks. Pt needs someone to help him take a bath/shower. Please advise. Thanks! ep

## 2015-12-04 ENCOUNTER — Other Ambulatory Visit: Payer: Self-pay | Admitting: Student

## 2015-12-04 ENCOUNTER — Telehealth: Payer: Self-pay | Admitting: General Practice

## 2015-12-04 ENCOUNTER — Telehealth: Payer: Self-pay | Admitting: Student

## 2015-12-04 NOTE — Telephone Encounter (Signed)
Talked to patient this afternoon. I'm required to have a face-to-face encounter documentation before I am able to order HHA. Patient was unhappy when I  tried to explain to him about this. He already has an appointment with me on 12/08/2015. I told him we could discuss about this and a "referral to GI for diarrhea". He was unhappy with my response and asked if he could talk to someone. I advised him to call the front desk office.

## 2015-12-04 NOTE — Telephone Encounter (Signed)
Called and talked to patient in response to his request for home health aide and GI referral for diarrhea. I have seen patient only once since I took over his care about 3 months ago. I recommended an office visit for evaluation before I would be able to order home health aide for him to discuss about this and diarrhea. Patient already has a schedule in 4 days with me. However, patient is unhappy because I didn't place a referral to GI without evaluation. I attempted to explain about the process of a referral. I explained to him that we have to evaluate him and document that in his chart before we place a referral. I also told him that we could take care of his acute diarrhea in clinic here. He didn't seem satisfied and asked if he could talk to someone else. So I advised him to call the main office number.  Off note, patient declined his colonoscopy in the past.

## 2015-12-04 NOTE — Telephone Encounter (Signed)
Pt calling in to check on the status of request.. States that he is requesting a HHA to come out to his home to assist him w/ daily routines.Charna Archer. Pleast assist w/ request. Advised pt of upcoming appt on 10/30

## 2015-12-08 ENCOUNTER — Ambulatory Visit: Payer: Medicare Other | Admitting: Student

## 2015-12-08 ENCOUNTER — Telehealth: Payer: Self-pay | Admitting: Internal Medicine

## 2015-12-08 ENCOUNTER — Encounter: Payer: Self-pay | Admitting: Student

## 2015-12-08 NOTE — Progress Notes (Signed)
Filled his death certificate to the best of my knowledge and gave to RooseveltJessica.

## 2015-12-08 NOTE — Telephone Encounter (Signed)
Opened in error

## 2015-12-09 ENCOUNTER — Encounter (HOSPITAL_COMMUNITY): Payer: Medicare Other

## 2015-12-10 DIAGNOSIS — 419620001 Death: Secondary | SNOMED CT | POA: Diagnosis not present

## 2015-12-10 NOTE — Telephone Encounter (Addendum)
[facility] After [facility]   Number received via page: [phone] Person calling:  Lindwood CokeJohn  Goldean  Reason for call: [facility]ron[facility] death [facility]   Per Lindwood CokeJohn Goldean, [facility], patient was found dead in his home. Pronouncement of death was made at  11:12 AM at 05-22-2015. No signs of foul play per EMS. Allowed EMS to release the body and fax death certificate to Dr. Alanda SlimGonfa to be filled out.   Noralee CharsAsiyah Aracelys Glade PGY-2 [facility]

## 2015-12-10 DEATH — deceased

## 2016-11-23 IMAGING — MR MR HEAD W/O CM
8 of 10 series · 35 of 48 positions shown · non-contrast
Comparison: Cervical spine MRI from today reported separately.

CLINICAL DATA: 54-year-old male with bilateral lower extremity
weakness, right upper extremity weakness. Initial encounter.

EXAM:
MRI HEAD WITHOUT CONTRAST
TECHNIQUE: Multiplanar, multiecho pulse sequences of the brain and surrounding
structures were obtained without intravenous contrast.

[Series 4: DWI · axial · 3.0mm · 1.09mm/px · z∈[+180,+317]mm · 9 of 96 slices shown (1 of 4)]
[im 1/96]
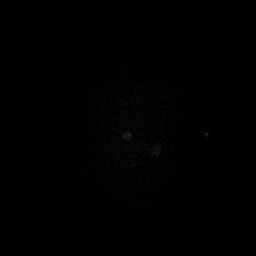
[im 12/96]
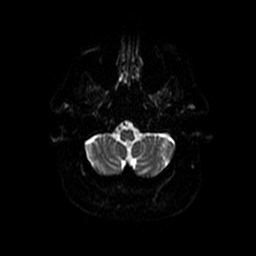
[im 24/96]
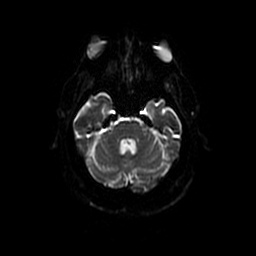
[im 36/96]
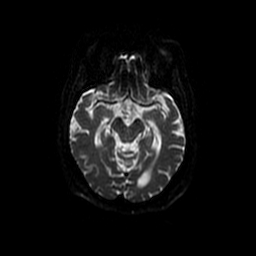
[im 48/96]
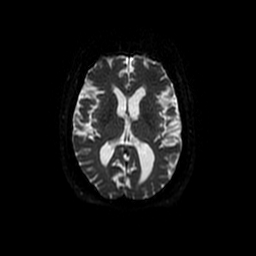
[im 60/96]
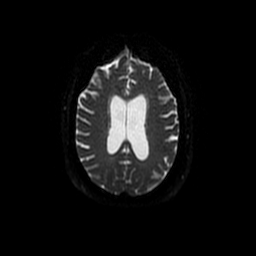
[im 72/96]
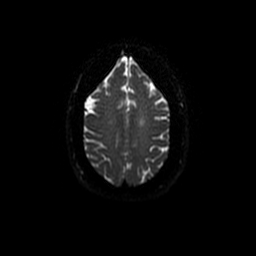
[im 84/96]
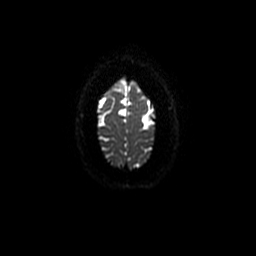
[im 96/96]
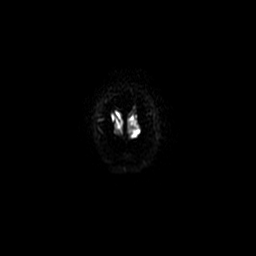

[Series 5: T1 · sagittal · 5.0mm · 0.47mm/px · 2 of 23 slices shown]
[im 1/23]
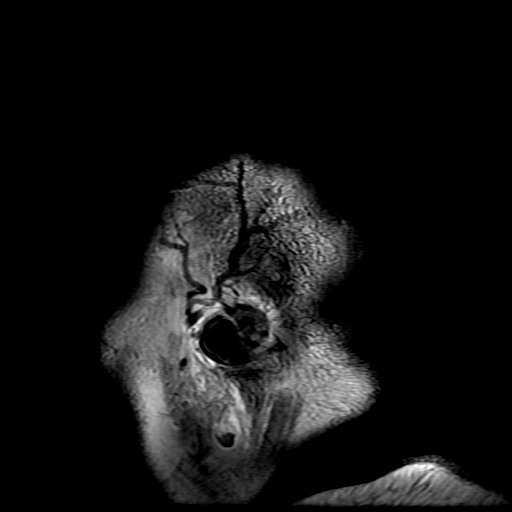
[im 23/23]
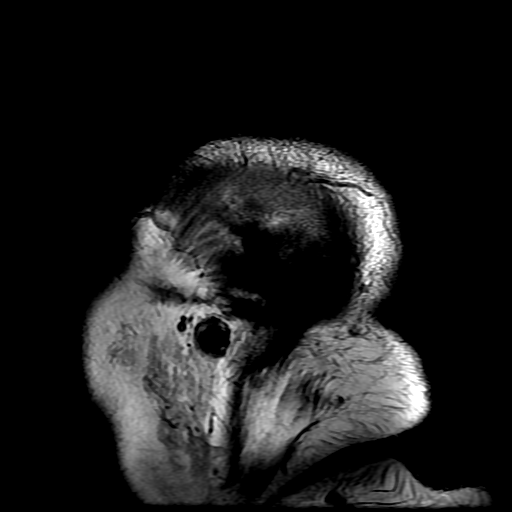

[Series 6: T2 · axial · 5.0mm · 0.43mm/px · z∈[+172,+307]mm · 3 of 24 slices shown (1 of 2)]
[im 1/24]
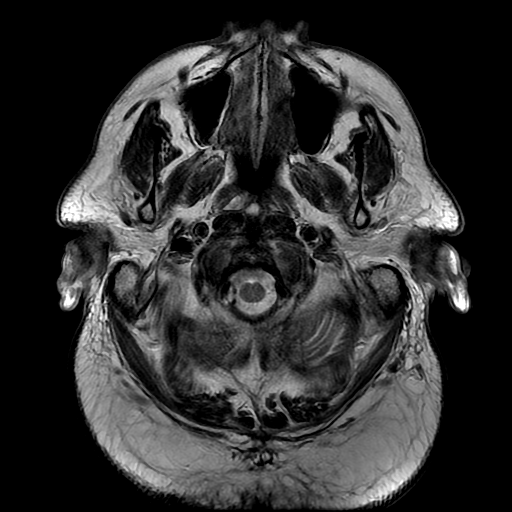
[im 12/24]
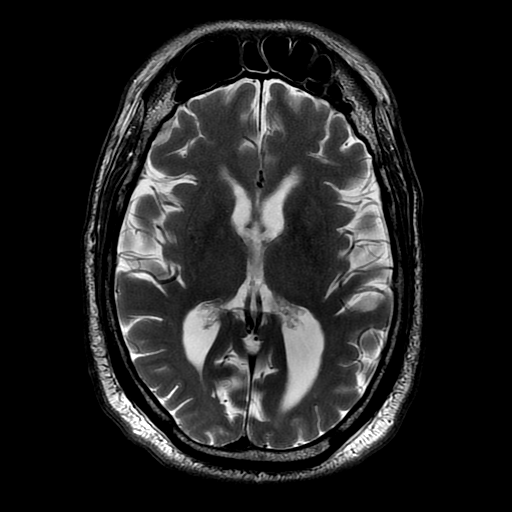
[im 24/24]
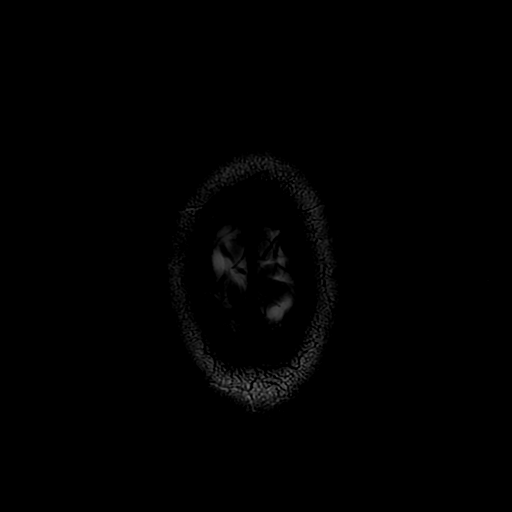

[Series 7: DWI · coronal · 5.0mm · 1.09mm/px · 7 of 64 slices shown (2 of 4)]
[im 1/64]
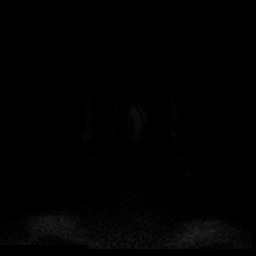
[im 11/64]
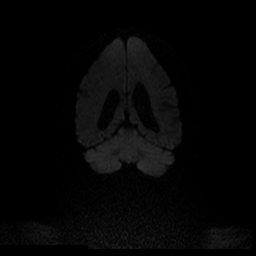
[im 22/64]
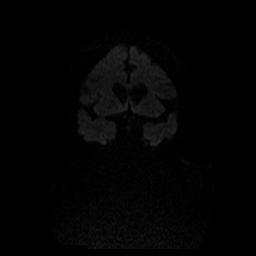
[im 32/64]
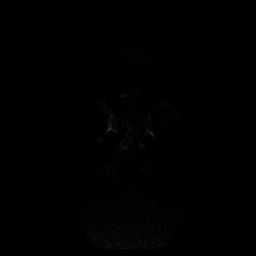
[im 43/64]
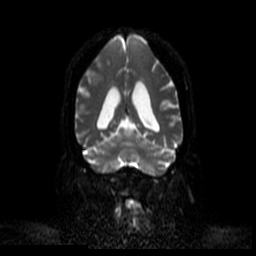
[im 53/64]
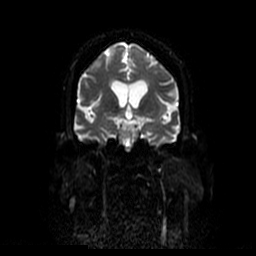
[im 64/64]
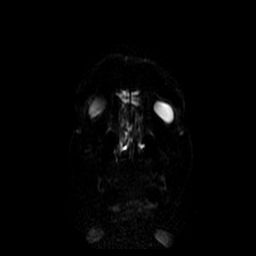

[Series 8: FLAIR · axial · 5.0mm · 0.43mm/px · z∈[+172,+307]mm · 3 of 24 slices shown]
[im 1/24]
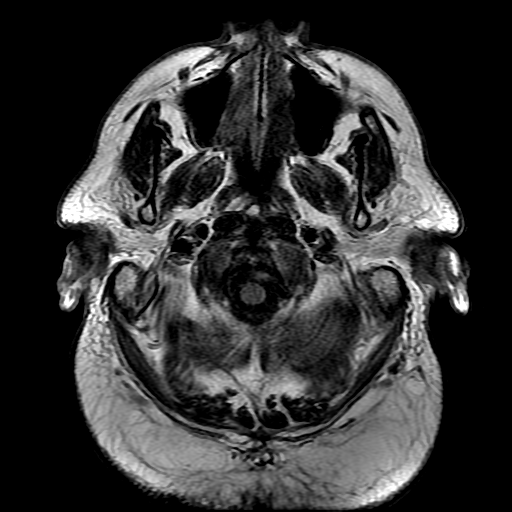
[im 12/24]
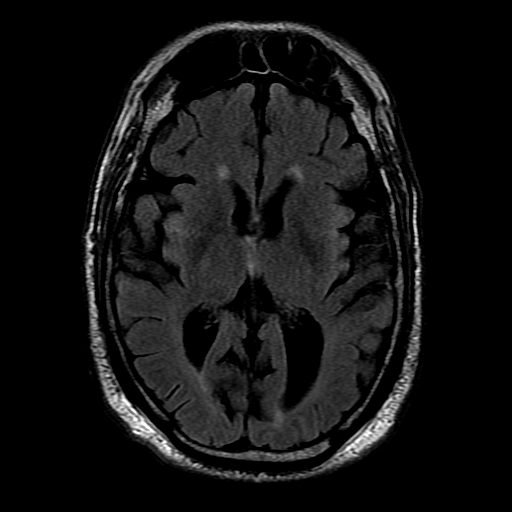
[im 24/24]
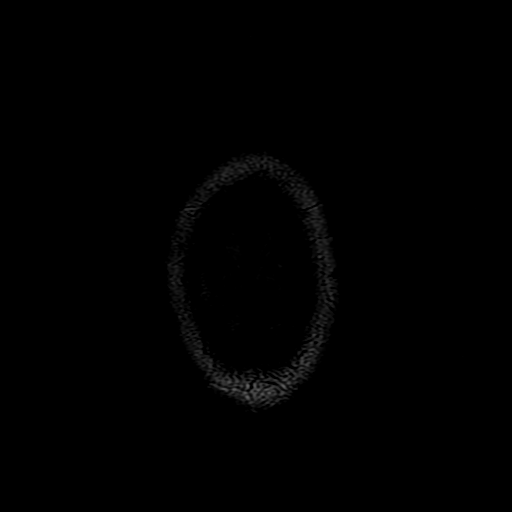

[Series 11: T2 · coronal · 5.0mm · 0.43mm/px · 3 of 28 slices shown (2 of 2)]
[im 1/28]
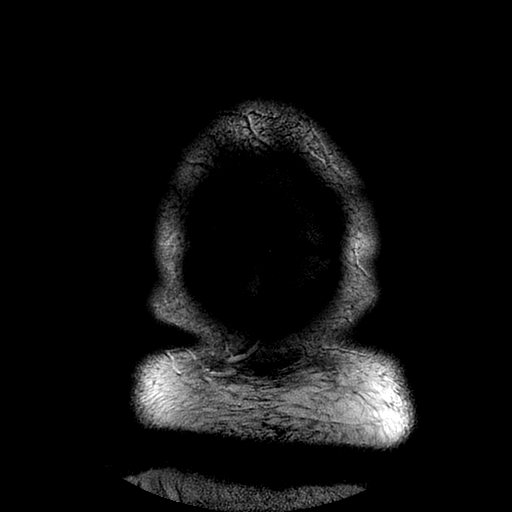
[im 14/28]
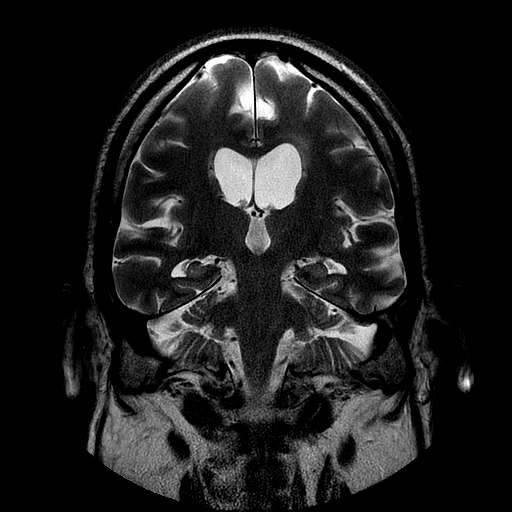
[im 28/28]
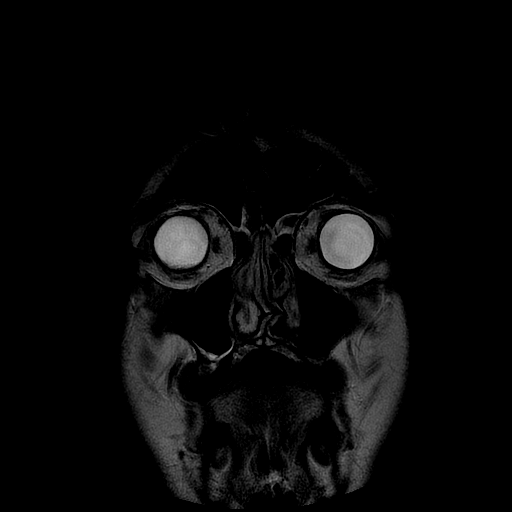

[Series 400: DWI · axial · 3.0mm · 1.09mm/px · z∈[+180,+317]mm · 5 of 48 slices shown (3 of 4)]
[im 1/48]
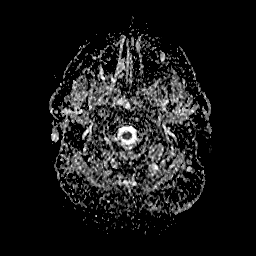
[im 12/48]
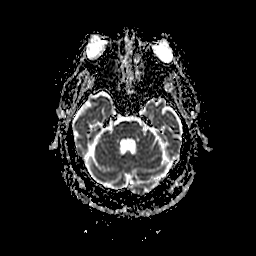
[im 24/48]
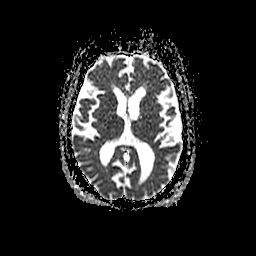
[im 36/48]
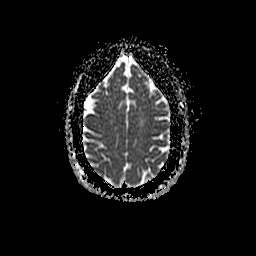
[im 48/48]
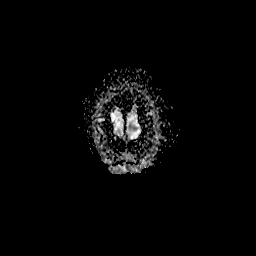

[Series 700: DWI · coronal · 5.0mm · 1.09mm/px · 3 of 32 slices shown (4 of 4)]
[im 1/32]
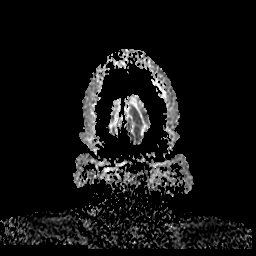
[im 16/32]
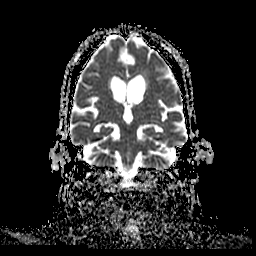
[im 32/32]
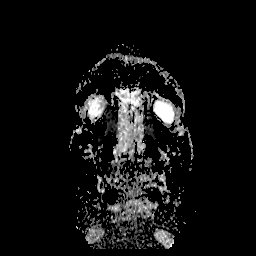

[35 of 48 positions shown; findings below may reference images not displayed]

FINDINGS: Mild to moderate ventriculomegaly, all ventricles affected. No
evidence of transependymal edema. No strong evidence of aqueductal
stenosis, and furthermore axial T2 images suggest hyperdynamic flow
in the cerebral aqueduct (series 6, image 8).

There is superimposed generalized decreased for age cerebral volume.
No focal encephalomalacia identified. Gray and white matter signal
is within normal limits for age throughout the brain. No chronic
cerebral blood products.

No restricted diffusion to suggest acute infarction. No midline
shift, mass effect, evidence of mass lesion, extra-axial collection
or acute intracranial hemorrhage. Cervicomedullary junction and
pituitary are within normal limits. Major intracranial vascular flow
voids are preserved.

Chronic mastoid sclerosis appears stable since 2880. Both tympanic
cavities also appear chronically opacified. Other Visible internal
auditory structures appear normal. Mild paranasal sinus mucosal
thickening. Postoperative changes to both globes. Negative scalp
soft tissues. Normal bone marrow signal.
IMPRESSION: 1. Mild to moderate degree of diffuse ventriculomegaly. Differential
considerations include Chronic Communicating Hydrocephalus, Normal
Pressure Hydrocephalus, and less likely Ex Vacuo enlargement (but
see also #2).
2. Cerebral volume does appear diminished for age in a generalized
fashion. No focal encephalomalacia, and overall preserved gray and
white matter signal.
3. Chronic bilateral middle ear and mastoid inflammation.

## 2016-11-23 IMAGING — MR MR CERVICAL SPINE W/O CM
4 of 5 series · 18 of 48 positions shown · non-contrast
Comparison: Neck CT 07/29/2007.

CLINICAL DATA: 54-year-old male with bilateral lower extremity
weakness, right upper extremity weakness. Initial encounter.

EXAM:
MRI CERVICAL SPINE WITHOUT CONTRAST
TECHNIQUE: Multiplanar, multisequence MR imaging of the cervical spine was
performed. No intravenous contrast was administered.

[Series 3: T2 · sagittal · 3.0mm · 0.43mm/px · 7 of 14 slices shown (1 of 2)]
[im 1/14]
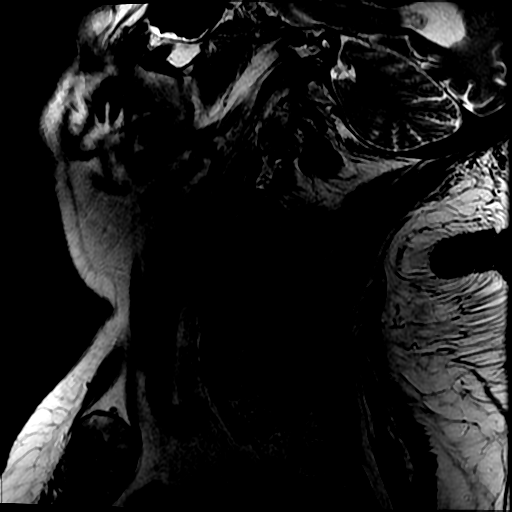
[im 3/14]
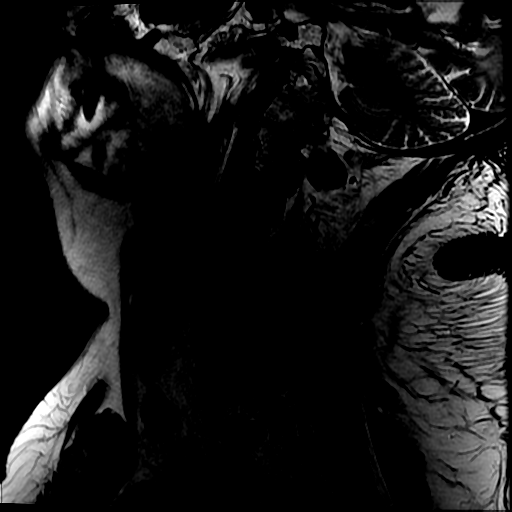
[im 5/14]
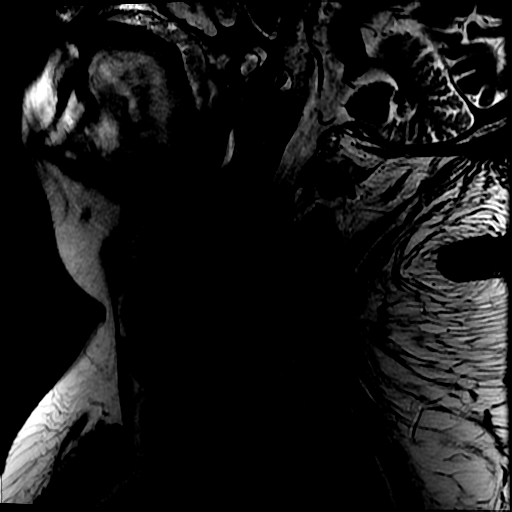
[im 7/14]
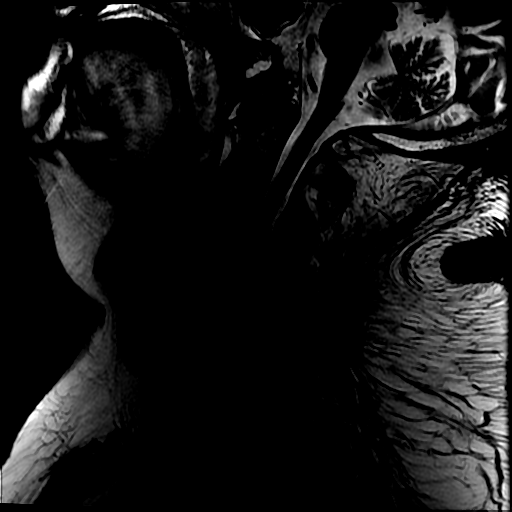
[im 9/14]
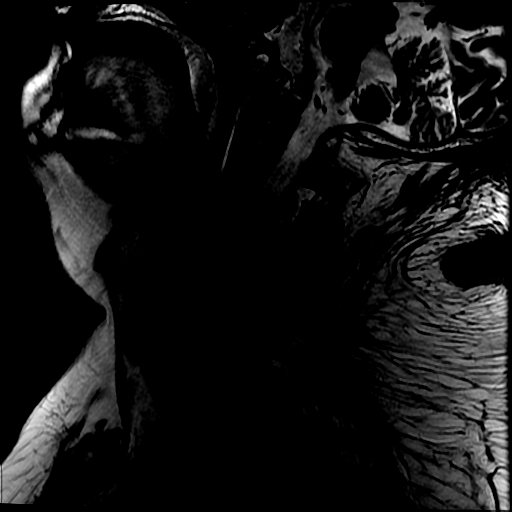
[im 11/14]
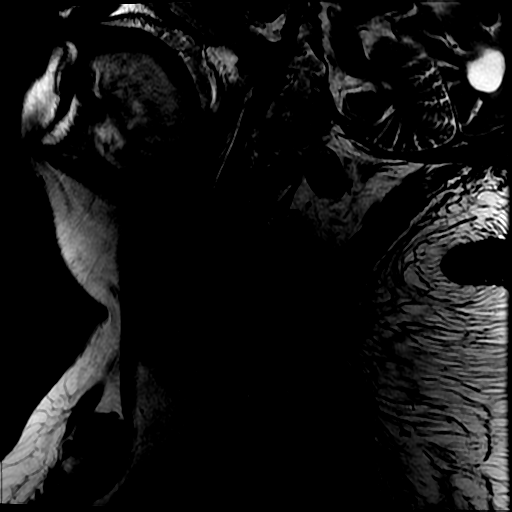
[im 14/14]
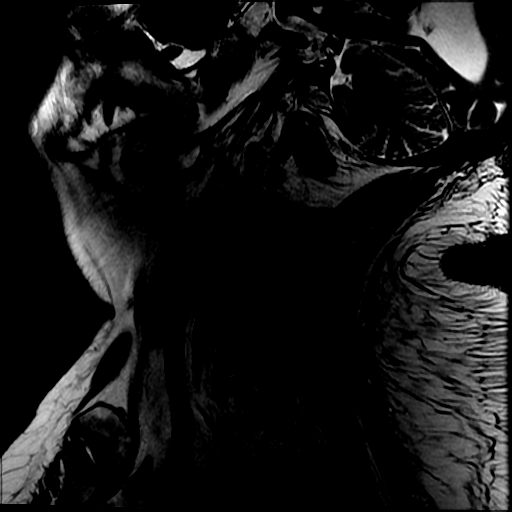

[Series 4: T1 · sagittal · 3.0mm · 0.43mm/px · 3 of 14 slices shown]
[im 3/14]
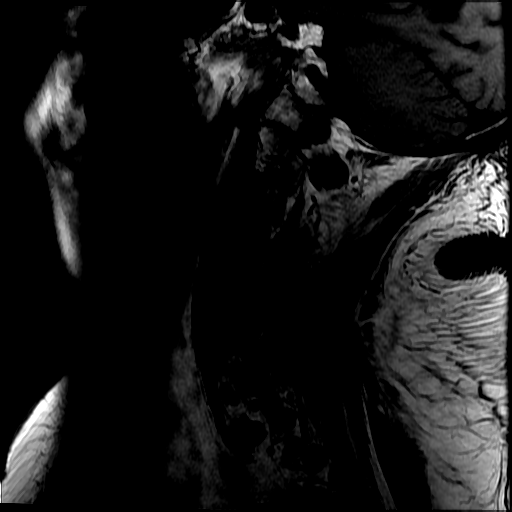
[im 7/14]
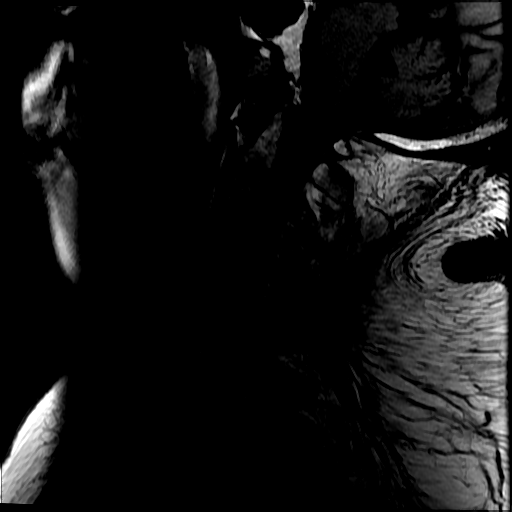
[im 11/14]
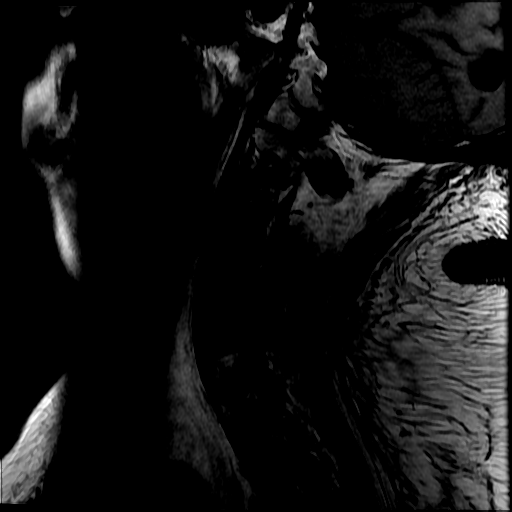

[Series 5: sag ir · sagittal · 3.0mm · 0.43mm/px · 3 of 14 slices shown]
[im 3/14]
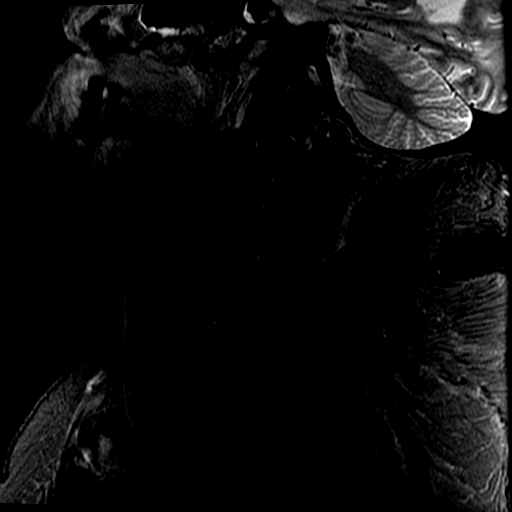
[im 8/14]
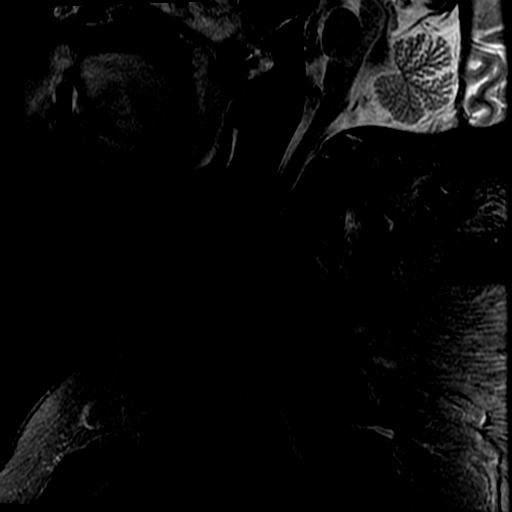
[im 14/14]
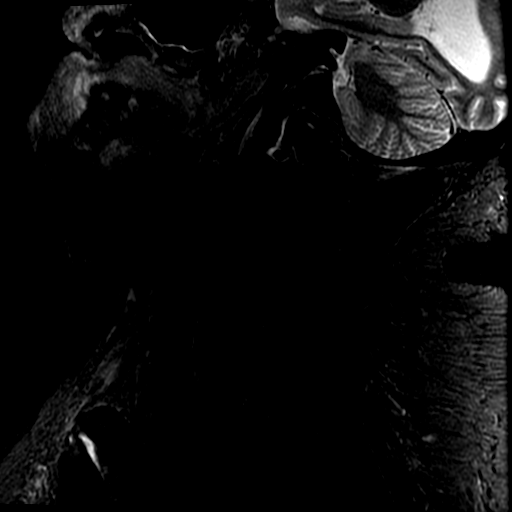

[Series 7: T2 · axial · 3.0mm · 0.39mm/px · z∈[-14,+73]mm · 5 of 32 slices shown (2 of 2)]
[im 1/32]
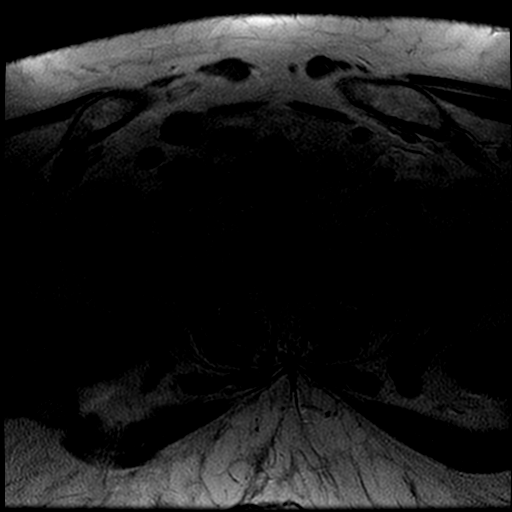
[im 5/32]
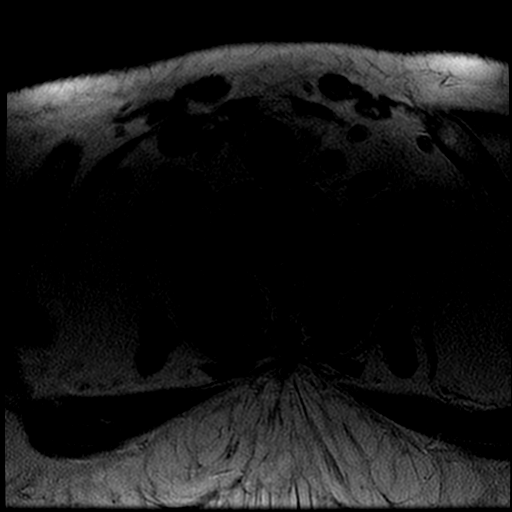
[im 10/32]
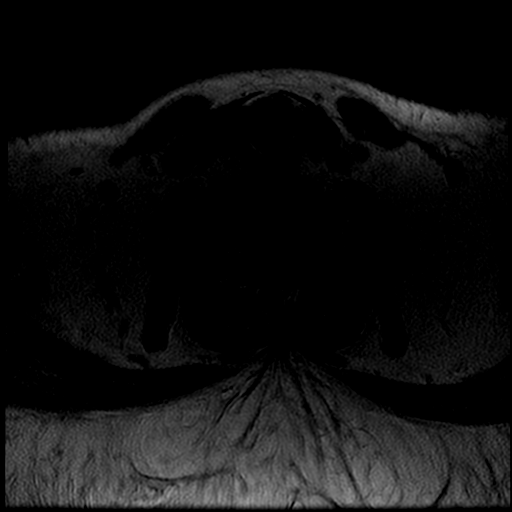
[im 17/32]
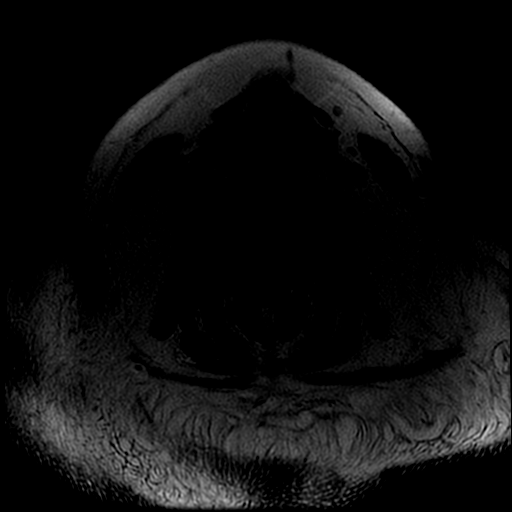
[im 27/32]
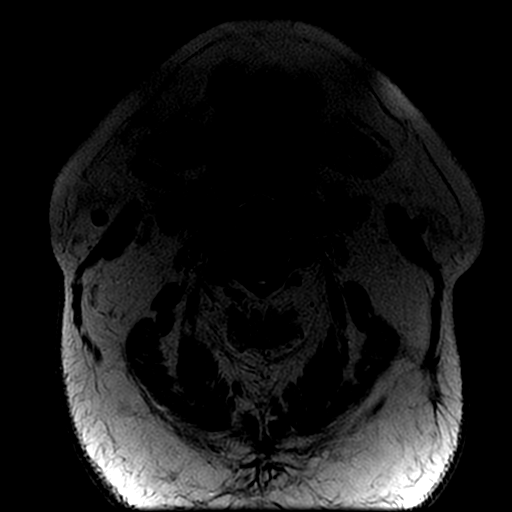

[18 of 48 positions shown; findings below may reference images not displayed]

FINDINGS: Large body habitus. Preserved cervical lordosis. Bulky anterior
C1-C2 osteophytosis better demonstrated on the 9556 CT. Trace
associated prevertebral fluid about the C2 osteophyte, most likely
degenerative in nature. No marrow edema or evidence of acute osseous
abnormality.

Suggestion of colpocephaly, incompletely evaluated. Visualized
posterior fossa structures appear normal. Cervicomedullary junction
is within normal limits.

No signal abnormality in the visualized spinal cord.

Large body habitus, mild subcutaneous edema posteriorly, a no
definite acute paraspinal soft tissue abnormality. Partially visible
bilateral sternoclavicular joint degeneration with joint effusions.

There is a degree of congenital spinal canal narrowing. The
following superimposed degenerative changes are noted:

C2-C3:  Negative.

C3-C4: Mild disc bulge. Borderline to mild spinal stenosis.
Uncovertebral hypertrophy with moderate right C4 foraminal stenosis.

C4-C5: Mild disc bulge. Spinal stenosis with no definite spinal cord
mass effect. Uncovertebral hypertrophy with mild to moderate
bilateral C5 foraminal stenosis.

C5-C6: Broad-based posterior disc osteophyte complex best seen on
series 6, image 18. Moderate ligament flavum hypertrophy. Spinal
stenosis with mild to moderate spinal cord mass effect.
Uncovertebral hypertrophy, but only mild C6 foraminal stenosis.

C6-C7: Right eccentric broad-based posterior disc bulge. Mild to
moderate ligament flavum hypertrophy. Spinal stenosis with up to
mild spinal cord mass effect. Uncovertebral hypertrophy with
moderate to severe right C7 foraminal stenosis.

C7-T1:  Mild facet hypertrophy.

No upper thoracic spinal stenosis.
IMPRESSION: 1. Combined congenital and acquired cervical spinal stenosis,
maximal at C5-C6 with up to moderate spinal cord mass effect. No
associated cord signal abnormality.
2. Lesser cervical spinal stenosis otherwise from C3-C4 to C6-C7.
3. Degenerative moderate or severe neural foraminal stenosis at the
right C4 and right C7 nerve levels.
4. Chronic bulky osteophytosis at the anterior C1-C2 articulation
with trace degenerative appearing prevertebral fluid there. No acute
osseous abnormality identified.
